# Patient Record
Sex: Female | Born: 1940
Health system: Southern US, Community
[De-identification: ages and names within clinical notes are randomized; demographics above are authoritative.]

## PROBLEM LIST (undated history)

## (undated) DIAGNOSIS — D649 Anemia, unspecified: Secondary | ICD-10-CM

## (undated) DIAGNOSIS — R1110 Vomiting, unspecified: Secondary | ICD-10-CM

## (undated) DIAGNOSIS — I499 Cardiac arrhythmia, unspecified: Secondary | ICD-10-CM

## (undated) DIAGNOSIS — Z9889 Other specified postprocedural states: Secondary | ICD-10-CM

## (undated) DIAGNOSIS — K5792 Diverticulitis of intestine, part unspecified, without perforation or abscess without bleeding: Secondary | ICD-10-CM

## (undated) DIAGNOSIS — IMO0001 Reserved for inherently not codable concepts without codable children: Secondary | ICD-10-CM

## (undated) DIAGNOSIS — I249 Acute ischemic heart disease, unspecified: Secondary | ICD-10-CM

## (undated) DIAGNOSIS — J189 Pneumonia, unspecified organism: Secondary | ICD-10-CM

## (undated) DIAGNOSIS — T4145XA Adverse effect of unspecified anesthetic, initial encounter: Secondary | ICD-10-CM

## (undated) DIAGNOSIS — T8859XA Other complications of anesthesia, initial encounter: Secondary | ICD-10-CM

## (undated) DIAGNOSIS — Z8679 Personal history of other diseases of the circulatory system: Secondary | ICD-10-CM

## (undated) DIAGNOSIS — I350 Nonrheumatic aortic (valve) stenosis: Secondary | ICD-10-CM

## (undated) DIAGNOSIS — R112 Nausea with vomiting, unspecified: Secondary | ICD-10-CM

## (undated) DIAGNOSIS — K219 Gastro-esophageal reflux disease without esophagitis: Secondary | ICD-10-CM

## (undated) DIAGNOSIS — I341 Nonrheumatic mitral (valve) prolapse: Secondary | ICD-10-CM

## (undated) DIAGNOSIS — I498 Other specified cardiac arrhythmias: Secondary | ICD-10-CM

## (undated) DIAGNOSIS — I2510 Atherosclerotic heart disease of native coronary artery without angina pectoris: Secondary | ICD-10-CM

## (undated) DIAGNOSIS — F419 Anxiety disorder, unspecified: Secondary | ICD-10-CM

## (undated) DIAGNOSIS — Z531 Procedure and treatment not carried out because of patient's decision for reasons of belief and group pressure: Secondary | ICD-10-CM

## (undated) DIAGNOSIS — I509 Heart failure, unspecified: Secondary | ICD-10-CM

## (undated) DIAGNOSIS — R51 Headache: Secondary | ICD-10-CM

## (undated) DIAGNOSIS — R3129 Other microscopic hematuria: Secondary | ICD-10-CM

## (undated) DIAGNOSIS — E049 Nontoxic goiter, unspecified: Secondary | ICD-10-CM

## (undated) DIAGNOSIS — R011 Cardiac murmur, unspecified: Secondary | ICD-10-CM

## (undated) DIAGNOSIS — I4891 Unspecified atrial fibrillation: Principal | ICD-10-CM

## (undated) DIAGNOSIS — E785 Hyperlipidemia, unspecified: Secondary | ICD-10-CM

## (undated) DIAGNOSIS — E0590 Thyrotoxicosis, unspecified without thyrotoxic crisis or storm: Secondary | ICD-10-CM

## (undated) DIAGNOSIS — R519 Headache, unspecified: Secondary | ICD-10-CM

## (undated) DIAGNOSIS — I10 Essential (primary) hypertension: Secondary | ICD-10-CM

## (undated) DIAGNOSIS — R0602 Shortness of breath: Secondary | ICD-10-CM

## (undated) DIAGNOSIS — K56609 Unspecified intestinal obstruction, unspecified as to partial versus complete obstruction: Secondary | ICD-10-CM

## (undated) DIAGNOSIS — E440 Moderate protein-calorie malnutrition: Secondary | ICD-10-CM

## (undated) HISTORY — DX: Gastro-esophageal reflux disease without esophagitis: K21.9

## (undated) HISTORY — PX: CORONARY ARTERY BYPASS GRAFT: SHX141

## (undated) HISTORY — PX: OTHER SURGICAL HISTORY: SHX169

## (undated) HISTORY — DX: Vomiting, unspecified: R11.10

## (undated) HISTORY — DX: Cardiac arrhythmia, unspecified: I49.9

## (undated) HISTORY — PX: URETHRAL STRICTURE DILATATION: SHX477

## (undated) HISTORY — DX: Nonrheumatic aortic (valve) stenosis: I35.0

## (undated) HISTORY — DX: Hyperlipidemia, unspecified: E78.5

## (undated) HISTORY — DX: Cardiac murmur, unspecified: R01.1

## (undated) HISTORY — DX: Other specified cardiac arrhythmias: I49.8

## (undated) HISTORY — DX: Unspecified intestinal obstruction, unspecified as to partial versus complete obstruction: K56.609

## (undated) HISTORY — DX: Moderate protein-calorie malnutrition: E44.0

---

## 1972-04-26 HISTORY — PX: TUBAL LIGATION: SHX77

## 1986-04-26 HISTORY — PX: UTERINE FIBROID SURGERY: SHX826

## 1986-04-26 HISTORY — PX: OVARIAN CYST SURGERY: SHX726

## 1998-01-03 ENCOUNTER — Ambulatory Visit (HOSPITAL_COMMUNITY): Admission: RE | Admit: 1998-01-03 | Discharge: 1998-01-03 | Payer: Self-pay | Admitting: Internal Medicine

## 1998-01-14 ENCOUNTER — Other Ambulatory Visit: Admission: RE | Admit: 1998-01-14 | Discharge: 1998-01-14 | Payer: Self-pay | Admitting: Surgery

## 1998-01-22 ENCOUNTER — Ambulatory Visit (HOSPITAL_COMMUNITY): Admission: RE | Admit: 1998-01-22 | Discharge: 1998-01-22 | Payer: Self-pay | Admitting: Surgery

## 1998-01-23 ENCOUNTER — Encounter: Payer: Self-pay | Admitting: Surgery

## 1999-06-04 ENCOUNTER — Other Ambulatory Visit: Admission: RE | Admit: 1999-06-04 | Discharge: 1999-06-04 | Payer: Self-pay | Admitting: Internal Medicine

## 1999-06-25 DIAGNOSIS — K649 Unspecified hemorrhoids: Secondary | ICD-10-CM | POA: Insufficient documentation

## 2000-04-01 ENCOUNTER — Encounter: Payer: Self-pay | Admitting: Internal Medicine

## 2000-04-01 ENCOUNTER — Ambulatory Visit (HOSPITAL_COMMUNITY): Admission: RE | Admit: 2000-04-01 | Discharge: 2000-04-01 | Payer: Self-pay | Admitting: Internal Medicine

## 2001-06-07 ENCOUNTER — Ambulatory Visit (HOSPITAL_COMMUNITY): Admission: RE | Admit: 2001-06-07 | Discharge: 2001-06-07 | Payer: Self-pay | Admitting: Internal Medicine

## 2001-06-07 ENCOUNTER — Encounter: Payer: Self-pay | Admitting: Internal Medicine

## 2006-04-01 ENCOUNTER — Ambulatory Visit: Payer: Self-pay | Admitting: Internal Medicine

## 2006-04-14 ENCOUNTER — Ambulatory Visit: Payer: Self-pay | Admitting: Internal Medicine

## 2006-09-28 ENCOUNTER — Ambulatory Visit (HOSPITAL_COMMUNITY): Admission: RE | Admit: 2006-09-28 | Discharge: 2006-09-28 | Payer: Self-pay | Admitting: Internal Medicine

## 2007-01-18 ENCOUNTER — Ambulatory Visit: Payer: Self-pay | Admitting: Internal Medicine

## 2007-01-18 LAB — CONVERTED CEMR LAB
Basophils Absolute: 0 10*3/uL (ref 0.0–0.1)
Basophils Relative: 0.6 % (ref 0.0–1.0)
Eosinophils Absolute: 0.1 10*3/uL (ref 0.0–0.6)
Eosinophils Relative: 0.8 % (ref 0.0–5.0)
HCT: 36.4 % (ref 36.0–46.0)
Hemoglobin: 12.8 g/dL (ref 12.0–15.0)
Lymphocytes Relative: 27.1 % (ref 12.0–46.0)
MCHC: 35.3 g/dL (ref 30.0–36.0)
MCV: 88.1 fL (ref 78.0–100.0)
Monocytes Absolute: 0.5 10*3/uL (ref 0.2–0.7)
Monocytes Relative: 7.2 % (ref 3.0–11.0)
Neutro Abs: 4.9 10*3/uL (ref 1.4–7.7)
Neutrophils Relative %: 64.3 % (ref 43.0–77.0)
Platelets: 228 10*3/uL (ref 150–400)
RBC: 4.13 M/uL (ref 3.87–5.11)
RDW: 12.9 % (ref 11.5–14.6)
WBC: 7.6 10*3/uL (ref 4.5–10.5)

## 2007-07-07 DIAGNOSIS — R011 Cardiac murmur, unspecified: Secondary | ICD-10-CM

## 2007-07-07 DIAGNOSIS — E041 Nontoxic single thyroid nodule: Secondary | ICD-10-CM | POA: Insufficient documentation

## 2007-07-07 DIAGNOSIS — E785 Hyperlipidemia, unspecified: Secondary | ICD-10-CM | POA: Insufficient documentation

## 2007-10-18 ENCOUNTER — Encounter: Admission: RE | Admit: 2007-10-18 | Discharge: 2007-10-18 | Payer: Self-pay | Admitting: Internal Medicine

## 2009-09-04 ENCOUNTER — Ambulatory Visit (HOSPITAL_COMMUNITY): Admission: RE | Admit: 2009-09-04 | Discharge: 2009-09-04 | Payer: Self-pay | Admitting: Internal Medicine

## 2010-01-14 ENCOUNTER — Encounter: Payer: Self-pay | Admitting: Cardiovascular Disease

## 2010-06-28 ENCOUNTER — Emergency Department (HOSPITAL_COMMUNITY)
Admission: EM | Admit: 2010-06-28 | Discharge: 2010-06-28 | Disposition: A | Discharge status: Home / Self Care | Payer: Medicare Other | Attending: Emergency Medicine | Admitting: Emergency Medicine

## 2010-06-28 ENCOUNTER — Emergency Department (HOSPITAL_COMMUNITY): Payer: Medicare Other

## 2010-06-28 DIAGNOSIS — E7800 Pure hypercholesterolemia, unspecified: Secondary | ICD-10-CM | POA: Insufficient documentation

## 2010-06-28 DIAGNOSIS — Z79899 Other long term (current) drug therapy: Secondary | ICD-10-CM | POA: Insufficient documentation

## 2010-06-28 DIAGNOSIS — R6884 Jaw pain: Secondary | ICD-10-CM | POA: Insufficient documentation

## 2010-06-28 DIAGNOSIS — J329 Chronic sinusitis, unspecified: Secondary | ICD-10-CM | POA: Insufficient documentation

## 2010-06-28 DIAGNOSIS — J3489 Other specified disorders of nose and nasal sinuses: Secondary | ICD-10-CM | POA: Insufficient documentation

## 2010-06-28 DIAGNOSIS — R0982 Postnasal drip: Secondary | ICD-10-CM | POA: Insufficient documentation

## 2010-06-28 DIAGNOSIS — R05 Cough: Secondary | ICD-10-CM | POA: Insufficient documentation

## 2010-06-28 DIAGNOSIS — R059 Cough, unspecified: Secondary | ICD-10-CM | POA: Insufficient documentation

## 2010-06-28 DIAGNOSIS — R6889 Other general symptoms and signs: Secondary | ICD-10-CM | POA: Insufficient documentation

## 2010-07-03 ENCOUNTER — Other Ambulatory Visit: Payer: Self-pay | Admitting: Internal Medicine

## 2010-07-03 DIAGNOSIS — R221 Localized swelling, mass and lump, neck: Secondary | ICD-10-CM

## 2010-07-07 ENCOUNTER — Ambulatory Visit
Admission: RE | Admit: 2010-07-07 | Discharge: 2010-07-07 | Disposition: A | Discharge status: Home / Self Care | Payer: Medicare Other | Source: Ambulatory Visit | Attending: Internal Medicine | Admitting: Internal Medicine

## 2010-07-07 DIAGNOSIS — R221 Localized swelling, mass and lump, neck: Secondary | ICD-10-CM

## 2010-07-07 MED ORDER — IOHEXOL 300 MG/ML  SOLN
75.0000 mL | Freq: Once | INTRAMUSCULAR | Status: AC | PRN
  Administered 2010-07-07: 75 mL via INTRAVENOUS

## 2010-07-10 ENCOUNTER — Encounter: Payer: Self-pay | Admitting: Cardiovascular Disease

## 2010-07-10 ENCOUNTER — Institutional Professional Consult (permissible substitution) (INDEPENDENT_AMBULATORY_CARE_PROVIDER_SITE_OTHER): Payer: Medicare Other | Admitting: Cardiovascular Disease

## 2010-07-10 DIAGNOSIS — I059 Rheumatic mitral valve disease, unspecified: Secondary | ICD-10-CM

## 2010-07-14 NOTE — Assessment & Plan Note (Signed)
Summary: consult: h/o cad. htn, [facility] [phone]. advantra. gd  Medications Added AVAPRO 75 MG TABS (IRBESARTAN) at bedtime CRESTOR 20 MG TABS (ROSUVASTATIN CALCIUM) Take one tablet by mouth daily. CENTRUM  TABS (MULTIPLE VITAMINS-MINERALS) once daily FISH OIL MAXIMUM STRENGTH 1200 MG CAPS (OMEGA-3 FATTY ACIDS) 1-2 daily SUPER CALCIUM 600 + D3 600-400 MG-UNIT TABS (CALCIUM CARBONATE-VITAMIN D) once daily VITAMIN D3 1000 UNIT TABS (CHOLECALCIFEROL) once daily VITAMIN C 500 MG TABS (ASCORBIC ACID) once daily      Allergies Added: ! NAPROXEN SODIUM (NAPROXEN SODIUM) ! ASPIRIN  Visit Type:  Initial Consult Primary Provider:  Dr. [name]  CC:  no complaints.  History of Present Illness: 70 yo female with history of GERD, mitral valve prolapse, hyperlipidemia, aortic stenosis and HTN who is here today to establish cardiac care. She has been followed in the past by Dr. Donnie Aho, Dr. Nicholaus Bloom and then Dr. Katrinka Blazing.  She is here today to establish care with me. No chest pain, SOB, palpitations, near syncope or syncope. She exercises most days at [employer]. She walks in her neighborhood.     Preventive Screening-Counseling & Management  Alcohol-Tobacco     Smoking Status: never  Caffeine-Diet-Exercise     Does Patient Exercise: yes      Drug Use:  no.    Current Medications (verified): 1)  Avapro 75 Mg Tabs (Irbesartan) .... At Bedtime 2)  Crestor 20 Mg Tabs (Rosuvastatin Calcium) .... Take One Tablet By Mouth Daily. 3)  Centrum  Tabs (Multiple Vitamins-Minerals) .... Once Daily 4)  Fish Oil Maximum Strength 1200 Mg Caps (Omega-3 Fatty Acids) .[name].. 1-2 Daily 5)  Super Calcium 600 + D3 600-400 Mg-Unit Tabs (Calcium Carbonate-Vitamin D) .... Once Daily 6)  Vitamin D3 1000 Unit Tabs (Cholecalciferol) .... Once Daily 7)  Vitamin C 500 Mg Tabs (Ascorbic Acid) .... Once Daily  Allergies (verified): 1)  ! Naproxen Sodium (Naproxen Sodium) 2)  ! Aspirin  Past History:  Past Medical  History: Current Problems:  NEOPLASM, COLON, FAMILY HX (ICD-V16.0) HYPERLIPIDEMIA (ICD-272.4) THYROID NODULE (ICD-241.0), Goiter MITRAL VALVE PROLAPSE (ICD-424.0) HEMORRHOIDS (ICD-455.6) DIVERTICULOSIS, COLON (ICD-562.10) HTN per records-pt denies GERD  Past Surgical History: Urethral dilatation Laparoscopic surgery abdomen Uterine fibroid removed  Family History: Reviewed history and no changes required. Father: died 35 - colon cancer Mother: died 62 COPD, dementia Siblings: 1-brother - living 37   CAD in uncles, grandparents  Social History: Reviewed history and no changes required. Retired-worked for [employer] Tobacco Use - No.  Alcohol Use - no Regular Exercise - yes Drug Use - no Smoking Status:  never Does Patient Exercise:  yes Drug Use:  no  Review of Systems  The patient denies fatigue, malaise, fever, weight gain/loss, vision loss, decreased hearing, hoarseness, chest pain, palpitations, shortness of breath, prolonged cough, wheezing, sleep apnea, coughing up blood, abdominal pain, blood in stool, nausea, vomiting, diarrhea, heartburn, incontinence, blood in urine, muscle weakness, joint pain, leg swelling, rash, skin lesions, headache, fainting, dizziness, depression, anxiety, enlarged lymph nodes, easy bruising or bleeding, and environmental allergies.    Vital Signs:  Patient profile:   70 year old female Height:      67 inches Weight:      138.25 pounds BMI:     21.73 Pulse rate:   53 / minute BP sitting:   115 / 46  (left arm) Cuff size:   regular  Vitals Entered By: Caralee Ates CMA (July 10, 2010 2:37 PM)  Physical Exam  General:  General: Well developed, well nourished, NAD HEENT: OP clear, mucus membranes moist. Thyroid enlarged.  SKIN: warm, dry Neuro: No focal deficits Musculoskeletal: Muscle strength 5/5 all ext Psychiatric: Mood and affect normal Neck: No JVD, no carotid bruits, no thyromegaly,  no lymphadenopathy. Lungs:Clear bilaterally, no wheezes, rhonci, crackles CV: RRR with systolic  murmur. No  gallops rubs Abdomen: soft, NT, ND, BS present Extremities: No edema, pulses 2+.    EKG  Procedure date:  07/10/2010  Findings:      NSR, rate 53 bpm. PVCs. Non-specific T wave abnormalities.   Impression & Recommendations:  Problem # 1:  MITRAL VALVE PROLAPSE (ICD-424.0)  She does not wish to ask for records from [facility]. No recent echo. Will order echo. Will call with results. Continue ARB for afterload reduction with mitral regurgitation.   Her updated medication list for this problem includes:    Avapro 75 Mg Tabs (Irbesartan) .[name]... At bedtime  Orders: Echocardiogram (Echo)  Patient Instructions: 1)  Your physician recommends that you schedule a follow-up appointment in: 6 months with Dr. Clifton James 2)  Your physician recommends that you continue on your current medications as directed. Please refer to the Current Medication list given to you today. 3)  Your physician has requested that you have an echocardiogram.  Echocardiography is a painless test that uses sound waves to create images of your heart. It provides your doctor with information about the size and shape of your heart and how well your heart's chambers and valves are working.  This procedure takes approximately one hour. There are no restrictions for this procedure.

## 2010-07-21 ENCOUNTER — Other Ambulatory Visit (HOSPITAL_COMMUNITY): Payer: Self-pay | Admitting: Cardiovascular Disease

## 2010-07-21 DIAGNOSIS — I341 Nonrheumatic mitral (valve) prolapse: Secondary | ICD-10-CM

## 2010-07-23 ENCOUNTER — Ambulatory Visit (HOSPITAL_COMMUNITY): Payer: Medicare Other | Attending: Cardiovascular Disease | Admitting: Radiology

## 2010-07-23 DIAGNOSIS — I341 Nonrheumatic mitral (valve) prolapse: Secondary | ICD-10-CM

## 2010-07-23 DIAGNOSIS — I10 Essential (primary) hypertension: Secondary | ICD-10-CM | POA: Insufficient documentation

## 2010-07-23 DIAGNOSIS — R011 Cardiac murmur, unspecified: Secondary | ICD-10-CM

## 2010-07-23 DIAGNOSIS — I059 Rheumatic mitral valve disease, unspecified: Secondary | ICD-10-CM | POA: Insufficient documentation

## 2010-07-23 DIAGNOSIS — I079 Rheumatic tricuspid valve disease, unspecified: Secondary | ICD-10-CM | POA: Insufficient documentation

## 2010-07-23 DIAGNOSIS — E785 Hyperlipidemia, unspecified: Secondary | ICD-10-CM | POA: Insufficient documentation

## 2010-09-08 NOTE — Assessment & Plan Note (Signed)
[facility]                         GASTROENTEROLOGY OFFICE NOTE   NAME:[name] [name]                    MRN:          [id number]  DATE:01/18/2007                            DOB:          1940/07/12    PRIMARY PHYSICIAN:  Geoffry Paradise, M.D.   PROBLEMS:  Left lower quadrant abdominal pain.   HISTORY:  [name] [name] is a pleasant 70 year old white female known to Dr.  Lina Sar, who last had colonoscopy done in December, 2007.  She was  noted to have universal diverticulosis at that time.  She had onset  around July 3 after eating a salad with an episode of severe crampy  abdominal pain in her left lower quadrant, which bothered her all night.  She said she gets a feeling like something is lodged and blocked in her  bowel, and then finally will start having bowel movements to the point  of diarrhea and purges her bowel.  She says that after one of these  episodes, she feels fine, perhaps for a day or two.  She said after the  episode in July, she did fine.  She had one mild episode in August, then  over this past weekend after eating at [employer] and having a large  salad, she developed bad, crampy abdominal pain again, followed  eventually by progressively looser stools, but since that time, she has  been having discomfort.  She has had a bowel movement, has not had any  melena or hematochezia.  No fevers or chills.  No nausea or vomiting.  Has had some increased bloating, etc.  She called the office yesterday.  Was called in a prescription for Symax, which she has been taking, and  says that did help last evening; however, she is still sore.   CURRENT MEDICATIONS:  1. Avapro 150 daily.  2. Aspirin 81 mg daily.  3. Crestor 20 daily.  4. Multivitamin daily.   ALLERGIES:  SULFA, NAPROXEN.   PAST MEDICAL HISTORY:  Pertinent for history of a goiter,  hyperlipidemia, mitral valve prolapse.   FAMILY HISTORY:  Pertinent for heart disease.  Her  father did have colon  cancer.   SOCIAL HISTORY:  Patient is divorced.  Has three children.  She lives  alone.  She is a nonsmoker.  Drinks alcohol seldom.   REVIEW OF SYSTEMS:  Pertinent for the heart murmur.  She does have  problems with anxiety and chronic problems with slight urinary stress  incontinence, otherwise as outlined above.   PHYSICAL EXAMINATION:  A well-developed white female in no acute  distress.  Height is 5 feet 7.  Weight is 143.6.  Blood pressure 120/76.  Pulse is  72.  HEENT:  Normocephalic and atraumatic.  PERRLA.  EOMI.  Sclerae are  anicteric.  NECK:  Supple.  CARDIOVASCULAR:  Regular rate and rhythm with S1 and S2.  No murmur, rub  or gallop.  PULMONARY:  Clear to A&P.  ABDOMEN:  Soft.  She is tender in the left lower quadrant.  There is no  guarding or rebound.  No mass or hepatosplenomegaly.  RECTAL:  Brown stool which  is heme positive, trace.   IMPRESSION:  97. A 70 year old white female with crampy left lower quadrant      abdominal discomfort now for five days.  Suspect she does have some      diverticulitis.  2. Trace hemoccult positive stools.  3. Other diagnoses as outlined above.   PLAN:  1. Continue Symax b.i.d. p.r.n.  2. Add Cipro 500 b.i.d. x10 days and Flagyl 500 mg b.i.d. x10 days.  3. Low residue diet until her pain is significantly improved.  4. I offered her a pain medication, which she declines at this time.  5. Return office visit with Dr. Juanda Chance in 2-3 weeks.  The patient was      also instructed that should her pain not resolve after she      completes the antibiotics, she should call us for further      instructions, and if the pain worsens in the interim, she should      also call and at that time will need CT of the abdomen and pelvis.      Mike Gip, PA-C  Electronically Signed      Wilhemina Bonito. Marina Goodell, MD  Electronically Signed   AE/MedQ  DD: 01/18/2007  DT: 01/19/2007  Job #: [id number]

## 2010-09-22 ENCOUNTER — Encounter: Payer: Self-pay | Admitting: Cardiovascular Disease

## 2011-04-19 ENCOUNTER — Emergency Department (HOSPITAL_COMMUNITY): Payer: Medicare Other

## 2011-04-19 ENCOUNTER — Emergency Department (HOSPITAL_COMMUNITY)
Admission: EM | Admit: 2011-04-19 | Discharge: 2011-04-19 | Disposition: A | Discharge status: Home / Self Care | Payer: Medicare Other | Attending: Emergency Medicine | Admitting: Emergency Medicine

## 2011-04-19 ENCOUNTER — Encounter: Payer: Self-pay | Admitting: *Deleted

## 2011-04-19 DIAGNOSIS — R509 Fever, unspecified: Secondary | ICD-10-CM | POA: Insufficient documentation

## 2011-04-19 DIAGNOSIS — K089 Disorder of teeth and supporting structures, unspecified: Secondary | ICD-10-CM | POA: Insufficient documentation

## 2011-04-19 DIAGNOSIS — R5383 Other fatigue: Secondary | ICD-10-CM | POA: Insufficient documentation

## 2011-04-19 DIAGNOSIS — K0889 Other specified disorders of teeth and supporting structures: Secondary | ICD-10-CM

## 2011-04-19 DIAGNOSIS — R5381 Other malaise: Secondary | ICD-10-CM | POA: Insufficient documentation

## 2011-04-19 DIAGNOSIS — I10 Essential (primary) hypertension: Secondary | ICD-10-CM | POA: Insufficient documentation

## 2011-04-19 HISTORY — DX: Nontoxic goiter, unspecified: E04.9

## 2011-04-19 HISTORY — DX: Essential (primary) hypertension: I10

## 2011-04-19 LAB — COMPREHENSIVE METABOLIC PANEL
ALT: 16 U/L (ref 0–35)
AST: 18 U/L (ref 0–37)
Albumin: 3.8 g/dL (ref 3.5–5.2)
Chloride: 100 mEq/L (ref 96–112)
Creatinine, Ser: 0.76 mg/dL (ref 0.50–1.10)
Potassium: 3.8 mEq/L (ref 3.5–5.1)
Sodium: 135 mEq/L (ref 135–145)
Total Bilirubin: 0.6 mg/dL (ref 0.3–1.2)

## 2011-04-19 LAB — URINALYSIS, ROUTINE W REFLEX MICROSCOPIC
Bilirubin Urine: NEGATIVE
Glucose, UA: NEGATIVE mg/dL
Protein, ur: NEGATIVE mg/dL

## 2011-04-19 LAB — CBC
MCV: 88 fL (ref 78.0–100.0)
Platelets: 204 10*3/uL (ref 150–400)
RBC: 3.99 MIL/uL (ref 3.87–5.11)
RDW: 13.5 % (ref 11.5–15.5)
WBC: 6 10*3/uL (ref 4.0–10.5)

## 2011-04-19 LAB — URINE MICROSCOPIC-ADD ON

## 2011-04-19 MED ORDER — SODIUM CHLORIDE 0.9 % IV BOLUS (SEPSIS)
250.0000 mL | Freq: Once | INTRAVENOUS | Status: AC
  Administered 2011-04-19: 250 mL via INTRAVENOUS

## 2011-04-19 MED ORDER — ONDANSETRON 4 MG PO TBDP
8.0000 mg | ORAL_TABLET | Freq: Once | ORAL | Status: AC
  Administered 2011-04-19: 8 mg via ORAL
  Filled 2011-04-19: qty 2

## 2011-04-19 NOTE — ED Provider Notes (Signed)
Medical screening examination/treatment/procedure(s) were conducted as a shared visit with non-physician practitioner(s) and myself.  I personally evaluated the patient during the encounter 70 year old, female, who had dental surgery at the beginning of December complains of right jaw pain, and a loud pop in her right ear that occurred last night.  She has not had headaches, nausea, vomiting, cough, shortness of breath, or diarrhea.  She denies urinary tract symptoms.  She has a history of mitral valve prolapse, so she was started on amoxicillin prior to her dental surgery.  She has Percocet, which she took prior to arrival here.  Odis Luster.  Symptoms are subsiding.  On physical examination.  There is no evidence of an oral pharyngeal infection.  She has no tenderness or swelling in her neck.  In her right ear is normal.  There is no evidence of acute illness.  We will release her to home .  Nicholes Stairs, MD 04/19/11 [phone]

## 2011-04-19 NOTE — ED Notes (Signed)
To ed for further eval of right lower jaw pain. Pt states she has been under the care of a dentist for this same pain and had teeth pulled, so now she thinks she has a dry socket.

## 2011-04-19 NOTE — ED Notes (Signed)
Pt now states "I am having a little weakness." no neurological deficits noted. Able to move all extremities without difficulty. Moved to Major side of ED for further evaluation per provider request.

## 2011-04-19 NOTE — ED Provider Notes (Signed)
Medical screening examination/treatment/procedure(s) were conducted as a shared visit with non-physician practitioner(s) and myself.  I personally evaluated the patient during the encounter  Nicholes Stairs, MD 04/19/11 [phone]

## 2011-04-19 NOTE — ED Provider Notes (Signed)
History     CSN: [id number]  Arrival date & time 04/19/11  0908   First MD Initiated Contact with Patient 04/19/11 0940      Chief Complaint  Patient presents with  . Dental Pain    (Consider location/radiation/quality/duration/timing/severity/associated sxs/prior treatment) Patient is a 70 y.o. female presenting with tooth pain. The history is provided by the patient.  Dental Pain   Pt presents to the ED with complaints of tooth pain and feeling weak with subjective fevers at home. The patient is awake and alert and able to tell me that she has been taking her antibiotics and pain medications. She denies cough, headaches, focal weakness, neck pain, abdominal pain, nausea, vomiting.  Past Medical History  Diagnosis Date  . Hypertension   . Goiter     History reviewed. No pertinent past surgical history.  History reviewed. No pertinent family history.  History  Substance Use Topics  . Smoking status: Never Smoker   . Smokeless tobacco: Not on file  . Alcohol Use: No    OB History    Grav Para Term Preterm Abortions TAB SAB Ect Mult Living                  Review of Systems  All other systems reviewed and are negative.    Allergies  Sulfa drugs cross reactors; Aspirin; and Naproxen sodium  Home Medications   Current Outpatient Rx  Name Route Sig Dispense Refill  . ACETAMINOPHEN 500 MG PO TABS Oral Take 500 mg by mouth every 6 (six) hours as needed. For pain     . AMOXICILLIN 500 MG PO CAPS Oral Take 1 mg by mouth 3 (three) times daily.      . OXYCODONE-ACETAMINOPHEN 5-325 MG PO TABS Oral Take 0.5 tablets by mouth every 4 (four) hours as needed. For pain       BP 109/34  Pulse 87  Temp(Src) 97.4 F (36.3 C) (Oral)  Resp 16  SpO2 96%  Physical Exam  Nursing note and vitals reviewed. Constitutional: She is oriented to person, place, and time. She appears well-developed and well-nourished.  HENT:  Head: Normocephalic and atraumatic.  Mouth/Throat:      Eyes: Conjunctivae are normal. Pupils are equal, round, and reactive to light.  Neck: Trachea normal, normal range of motion and full passive range of motion without pain. Neck supple. No tracheal deviation present. No thyromegaly present.  Cardiovascular: Normal rate, regular rhythm and normal pulses.   Pulmonary/Chest: Effort normal and breath sounds normal. No respiratory distress. She has no wheezes. She has no rales. Chest wall is not dull to percussion. She exhibits no tenderness, no crepitus, no edema, no deformity and no retraction.  Abdominal: Soft. Normal appearance and bowel sounds are normal. She exhibits no distension and no mass. There is no tenderness. There is no rebound and no guarding.  Musculoskeletal: Normal range of motion.  Neurological: She is alert and oriented to person, place, and time. She has normal strength.  Skin: Skin is warm, dry and intact.  Psychiatric: She has a normal mood and affect. Her speech is normal and behavior is normal. Judgment and thought content normal. Cognition and memory are normal.    ED Course  Procedures (including critical care time)  Labs Reviewed  CBC - Abnormal; Notable for the following:    Hemoglobin 11.9 (*)    HCT 35.1 (*)    All other components within normal limits  COMPREHENSIVE METABOLIC PANEL - Abnormal; Notable for the  following:    Glucose, Bld 101 (*)    GFR calc non Af Amer 84 (*)    All other components within normal limits  URINALYSIS, ROUTINE W REFLEX MICROSCOPIC - Abnormal; Notable for the following:    Hgb urine dipstick MODERATE (*)    All other components within normal limits  URINE MICROSCOPIC-ADD ON   Dg Chest 2 View  04/19/2011  *RADIOLOGY REPORT*  Clinical Data: Fever, chills, weakness.  CHEST - 2 VIEW  Comparison: None.  Findings: Heart and mediastinal contours are within normal limits. No focal opacities or effusions.  No acute bony abnormality.  IMPRESSION: No active cardiopulmonary disease.   Original Report Authenticated By: Cyndie Chime, M.D.     1. Pain of tooth socket       MDM  Dr. Nino Parsley has examined patient as well and reviewed labs with me. Pt is stable and is able to be discharged at this time. Will have follow-up with Dentist if patient continue to have dental pain. Pt given strict return to ED instructions.        Dorthula Matas, PA 04/19/11 1158

## 2011-04-24 ENCOUNTER — Inpatient Hospital Stay (HOSPITAL_COMMUNITY)
Admission: EM | Admit: 2011-04-24 | Discharge: 2011-04-28 | DRG: 392 | Disposition: A | Discharge status: Home / Self Care | Payer: Medicare Other | Attending: Internal Medicine | Admitting: Internal Medicine

## 2011-04-24 ENCOUNTER — Encounter (HOSPITAL_COMMUNITY): Payer: Self-pay | Admitting: Emergency Medicine

## 2011-04-24 ENCOUNTER — Emergency Department (HOSPITAL_COMMUNITY): Payer: Medicare Other

## 2011-04-24 DIAGNOSIS — F411 Generalized anxiety disorder: Secondary | ICD-10-CM | POA: Diagnosis present

## 2011-04-24 DIAGNOSIS — Z79899 Other long term (current) drug therapy: Secondary | ICD-10-CM

## 2011-04-24 DIAGNOSIS — K5792 Diverticulitis of intestine, part unspecified, without perforation or abscess without bleeding: Secondary | ICD-10-CM | POA: Diagnosis present

## 2011-04-24 DIAGNOSIS — E876 Hypokalemia: Secondary | ICD-10-CM | POA: Diagnosis present

## 2011-04-24 DIAGNOSIS — Z882 Allergy status to sulfonamides status: Secondary | ICD-10-CM

## 2011-04-24 DIAGNOSIS — Z888 Allergy status to other drugs, medicaments and biological substances status: Secondary | ICD-10-CM

## 2011-04-24 DIAGNOSIS — F419 Anxiety disorder, unspecified: Secondary | ICD-10-CM | POA: Diagnosis present

## 2011-04-24 DIAGNOSIS — R110 Nausea: Secondary | ICD-10-CM | POA: Diagnosis present

## 2011-04-24 DIAGNOSIS — K644 Residual hemorrhoidal skin tags: Secondary | ICD-10-CM | POA: Diagnosis present

## 2011-04-24 DIAGNOSIS — I359 Nonrheumatic aortic valve disorder, unspecified: Secondary | ICD-10-CM | POA: Diagnosis present

## 2011-04-24 DIAGNOSIS — K219 Gastro-esophageal reflux disease without esophagitis: Secondary | ICD-10-CM | POA: Diagnosis present

## 2011-04-24 DIAGNOSIS — E785 Hyperlipidemia, unspecified: Secondary | ICD-10-CM | POA: Diagnosis present

## 2011-04-24 DIAGNOSIS — I10 Essential (primary) hypertension: Secondary | ICD-10-CM | POA: Diagnosis present

## 2011-04-24 DIAGNOSIS — Z23 Encounter for immunization: Secondary | ICD-10-CM

## 2011-04-24 DIAGNOSIS — K5732 Diverticulitis of large intestine without perforation or abscess without bleeding: Principal | ICD-10-CM | POA: Diagnosis present

## 2011-04-24 HISTORY — DX: Diverticulitis of intestine, part unspecified, without perforation or abscess without bleeding: K57.92

## 2011-04-24 HISTORY — DX: Anxiety disorder, unspecified: F41.9

## 2011-04-24 LAB — DIFFERENTIAL
Basophils Absolute: 0 10*3/uL (ref 0.0–0.1)
Lymphocytes Relative: 9 % — ABNORMAL LOW (ref 12–46)
Neutro Abs: 12.2 10*3/uL — ABNORMAL HIGH (ref 1.7–7.7)

## 2011-04-24 LAB — BASIC METABOLIC PANEL
CO2: 22 mEq/L (ref 19–32)
Chloride: 101 mEq/L (ref 96–112)
Potassium: 3.8 mEq/L (ref 3.5–5.1)
Sodium: 134 mEq/L — ABNORMAL LOW (ref 135–145)

## 2011-04-24 LAB — CBC
HCT: 37.8 % (ref 36.0–46.0)
Platelets: 272 10*3/uL (ref 150–400)
RDW: 13.7 % (ref 11.5–15.5)
WBC: 14.3 10*3/uL — ABNORMAL HIGH (ref 4.0–10.5)

## 2011-04-24 LAB — OCCULT BLOOD, POC DEVICE: Fecal Occult Bld: NEGATIVE

## 2011-04-24 MED ORDER — METRONIDAZOLE IN NACL 5-0.79 MG/ML-% IV SOLN
500.0000 mg | Freq: Three times a day (TID) | INTRAVENOUS | Status: DC
  Administered 2011-04-24 – 2011-04-27 (×8): 500 mg via INTRAVENOUS
  Filled 2011-04-24 (×9): qty 100

## 2011-04-24 MED ORDER — ALUM & MAG HYDROXIDE-SIMETH 200-200-20 MG/5ML PO SUSP
30.0000 mL | Freq: Four times a day (QID) | ORAL | Status: DC | PRN
  Administered 2011-04-26: 30 mL via ORAL
  Filled 2011-04-24: qty 30

## 2011-04-24 MED ORDER — METOCLOPRAMIDE HCL 5 MG/ML IJ SOLN
10.0000 mg | Freq: Once | INTRAMUSCULAR | Status: AC
  Administered 2011-04-24: 10 mg via INTRAVENOUS
  Filled 2011-04-24: qty 2

## 2011-04-24 MED ORDER — HYDROMORPHONE HCL PF 1 MG/ML IJ SOLN
1.0000 mg | Freq: Once | INTRAMUSCULAR | Status: AC
  Administered 2011-04-24: 1 mg via INTRAVENOUS
  Filled 2011-04-24: qty 1

## 2011-04-24 MED ORDER — LORAZEPAM 2 MG/ML IJ SOLN
0.5000 mg | Freq: Once | INTRAMUSCULAR | Status: AC
  Administered 2011-04-24: 0.5 mg via INTRAVENOUS
  Filled 2011-04-24: qty 1

## 2011-04-24 MED ORDER — ONDANSETRON HCL 4 MG/2ML IJ SOLN
4.0000 mg | Freq: Once | INTRAMUSCULAR | Status: AC
  Administered 2011-04-24: 4 mg via INTRAVENOUS
  Filled 2011-04-24: qty 2

## 2011-04-24 MED ORDER — LORAZEPAM 1 MG PO TABS
1.0000 mg | ORAL_TABLET | Freq: Once | ORAL | Status: AC
  Administered 2011-04-24: 1 mg via ORAL
  Filled 2011-04-24: qty 1

## 2011-04-24 MED ORDER — ONDANSETRON HCL 4 MG/2ML IJ SOLN: 4.0000 mg | Freq: Four times a day (QID) | INTRAMUSCULAR | Status: DC | PRN

## 2011-04-24 MED ORDER — ONDANSETRON HCL 4 MG PO TABS: 4.0000 mg | ORAL_TABLET | Freq: Four times a day (QID) | ORAL | Status: DC | PRN

## 2011-04-24 MED ORDER — POLYETHYLENE GLYCOL 3350 17 G PO PACK
17.0000 g | PACK | Freq: Every day | ORAL | Status: DC | PRN
  Filled 2011-04-24: qty 1

## 2011-04-24 MED ORDER — IOHEXOL 300 MG/ML  SOLN
100.0000 mL | Freq: Once | INTRAMUSCULAR | Status: AC | PRN
  Administered 2011-04-24: 100 mL via INTRAVENOUS

## 2011-04-24 MED ORDER — OLMESARTAN 10 MG HALF TABLET
10.0000 mg | ORAL_TABLET | Freq: Every day | ORAL | Status: DC
  Administered 2011-04-25 – 2011-04-28 (×4): 10 mg via ORAL
  Filled 2011-04-24 (×4): qty 1

## 2011-04-24 MED ORDER — ACETAMINOPHEN 325 MG PO TABS
650.0000 mg | ORAL_TABLET | Freq: Four times a day (QID) | ORAL | Status: DC | PRN
  Administered 2011-04-25 – 2011-04-28 (×5): 650 mg via ORAL
  Filled 2011-04-24 (×5): qty 2

## 2011-04-24 MED ORDER — BIOTENE DRY MOUTH MT LIQD
15.0000 mL | Freq: Two times a day (BID) | OROMUCOSAL | Status: DC
  Administered 2011-04-25 – 2011-04-27 (×6): 15 mL via OROMUCOSAL

## 2011-04-24 MED ORDER — CIPROFLOXACIN IN D5W 400 MG/200ML IV SOLN
400.0000 mg | Freq: Two times a day (BID) | INTRAVENOUS | Status: DC
  Administered 2011-04-25 – 2011-04-26 (×5): 400 mg via INTRAVENOUS
  Filled 2011-04-24 (×7): qty 200

## 2011-04-24 MED ORDER — MORPHINE SULFATE 2 MG/ML IJ SOLN
2.0000 mg | INTRAMUSCULAR | Status: DC | PRN
  Administered 2011-04-25 – 2011-04-27 (×5): 2 mg via INTRAVENOUS
  Filled 2011-04-24 (×5): qty 1

## 2011-04-24 MED ORDER — SODIUM CHLORIDE 0.9 % IV SOLN
INTRAVENOUS | Status: DC
  Administered 2011-04-24 – 2011-04-27 (×3): via INTRAVENOUS

## 2011-04-24 MED ORDER — SODIUM CHLORIDE 0.9 % IV BOLUS (SEPSIS)
500.0000 mL | INTRAVENOUS | Status: AC
  Administered 2011-04-24: 500 mL via INTRAVENOUS

## 2011-04-24 MED ORDER — LORAZEPAM 1 MG PO TABS
1.0000 mg | ORAL_TABLET | Freq: Four times a day (QID) | ORAL | Status: DC | PRN
  Administered 2011-04-26 – 2011-04-27 (×4): 1 mg via ORAL
  Filled 2011-04-24 (×4): qty 1

## 2011-04-24 MED ORDER — CHLORHEXIDINE GLUCONATE 0.12 % MT SOLN
15.0000 mL | Freq: Two times a day (BID) | OROMUCOSAL | Status: DC
  Administered 2011-04-24 – 2011-04-27 (×7): 15 mL via OROMUCOSAL
  Filled 2011-04-24 (×6): qty 15

## 2011-04-24 MED ORDER — ENOXAPARIN SODIUM 40 MG/0.4ML ~~LOC~~ SOLN
40.0000 mg | Freq: Every day | SUBCUTANEOUS | Status: DC
  Administered 2011-04-25 – 2011-04-27 (×3): 40 mg via SUBCUTANEOUS
  Filled 2011-04-24 (×4): qty 0.4

## 2011-04-24 NOTE — ED Notes (Signed)
Pt is vomiting again. Pt already received zofran without relief from nausea/vomiting.

## 2011-04-24 NOTE — ED Provider Notes (Signed)
Pt w/ h/o diverticulitis presented to ED today w/ LLQ pain and N/V.  Moved to CDU to wait for CT abd/pelvis results.  Continues to have pain and vomiting.  Most recent dose of dilaudid made her "out of it".  CT abd/pelvis shows mild acute diverticulitis.  Results discussed w/ pt.  She does not want to go home because not feeling well enough and lives alone.  Will initiate cipro/flagyl, treat nausea and reassess shortly.  6:17 PM   Pt still vomiting after receiving zofran.  Will try 10mg  IV reglan. 6:34 PM   Nausea improved but pt continues to have severe LLQ pain.  I do not feel comfortable giving her any other narcotic pain relievers because the dilaudid has made her very anxious and she feels as though it is more difficult to breath.  On re-examination, pt anxious appearing, VSS, abd benign but mod-sev LLQ ttp w/ guarding.  Dr. Waynard Edwards consulted for admission for diverticulitis w/ uncontrolled symptoms.    Otilio Miu, [place] 04/25/11 [phone]

## 2011-04-24 NOTE — ED Notes (Signed)
Pt vomiting again. Pt received reglan 10mg  iv just a few minutes ago but is still very nauseated.

## 2011-04-24 NOTE — ED Notes (Signed)
Physician at bedside evaluating patient.

## 2011-04-24 NOTE — ED Notes (Signed)
Pt reports constipation onset last night. Pt has tried stool softeners, suppositories, and milk of magnesia. Last normal BM 2 days ago.

## 2011-04-24 NOTE — ED Provider Notes (Signed)
3:21 PM Patient to come to CDU holding for CT abd/pelvis, concern for diverticulitis.  Patient with LLQ pain, nausea, constipation, hematochezia.  Patient's GI doctor is Huston Foley, PCP is [name].  Discussed patient with Kyung Bacca, PA-C, who assumes care of patient at change of shift.    Dillard Cannon Cruger, [place] 04/24/11 [phone]

## 2011-04-24 NOTE — ED Provider Notes (Signed)
History     CSN: [id number]  Arrival date & time 04/24/11  1240   First MD Initiated Contact with Patient 04/24/11 1343      Chief Complaint  Patient presents with  . Constipation    (Consider location/radiation/quality/duration/timing/severity/associated sxs/prior treatment) HPI Comments: Patient reports that she began having LLQ abdominal pain yesterday, which is getting progressively worse.  She also reports that she has been taking oxycodone for tooth pain for the past 5 days.  She reports that her last BM was three days ago and she reports that it was hard.  She also has had a small amount of bright red blood mixed in with a very little amount of stool this morning.  She has taken stool softeners and Milk of Mag for the constipation, which has not helped.  She has a PMH significant for Diverticulitis.  Her GI physician is Dr. Elige Radon.  She reports that is she is nauseous, but denies any vomiting.  No fevers.  Patient is a 70 y.o. female presenting with abdominal pain. The history is provided by the patient.  Abdominal Pain The primary symptoms of the illness include abdominal pain, nausea and hematochezia. The primary symptoms of the illness do not include fever, shortness of breath, vomiting, diarrhea or dysuria. The onset of the illness was gradual. The problem has been gradually worsening.  The patient has had a change in bowel habit. Additional symptoms associated with the illness include constipation. Symptoms associated with the illness do not include chills, diaphoresis, urgency, hematuria, frequency or back pain. Significant associated medical issues include diverticulitis.    Past Medical History  Diagnosis Date  . Hypertension   . Goiter   . Diverticulitis     Past Surgical History  Procedure Date  . Kidney surgery     History reviewed. No pertinent family history.  History  Substance Use Topics  . Smoking status: Never Smoker   . Smokeless tobacco: Not on file   . Alcohol Use: No    OB History    Grav Para Term Preterm Abortions TAB SAB Ect Mult Living                  Review of Systems  Constitutional: Negative for fever, chills and diaphoresis.  HENT: Positive for dental problem. Negative for trouble swallowing, neck pain and neck stiffness.   Respiratory: Negative for shortness of breath.   Cardiovascular: Negative for chest pain.  Gastrointestinal: Positive for nausea, abdominal pain, constipation and hematochezia. Negative for vomiting and diarrhea.  Genitourinary: Negative for dysuria, urgency, frequency and hematuria.  Musculoskeletal: Negative for back pain.  Neurological: Negative for dizziness, syncope and light-headedness.  Psychiatric/Behavioral: Negative for confusion.    Allergies  Sulfa drugs cross reactors; Aspirin; and Naproxen sodium  Home Medications   Current Outpatient Rx  Name Route Sig Dispense Refill  . ACETAMINOPHEN 500 MG PO TABS Oral Take 500 mg by mouth every 6 (six) hours as needed. For pain    . AMOXICILLIN 500 MG PO CAPS Oral Take 1 mg by mouth 3 (three) times daily.     . IRBESARTAN 75 MG PO TABS Oral Take 75 mg by mouth at bedtime.      . OXYCODONE-ACETAMINOPHEN 5-325 MG PO TABS Oral Take 0.5 tablets by mouth every 4 (four) hours as needed. For pain      BP 128/98  Pulse 70  Temp(Src) 97.8 F (36.6 C) (Oral)  Resp 20  SpO2 98%  Physical Exam  Nursing note  and vitals reviewed. Constitutional: She is oriented to person, place, and time. She appears well-developed and well-nourished. No distress.  HENT:  Head: Normocephalic and atraumatic.  Neck: Normal range of motion. Neck supple.  Cardiovascular: Normal rate and regular rhythm.   Murmur heard. Pulmonary/Chest: Effort normal and breath sounds normal. No respiratory distress. She has no wheezes.  Abdominal: Soft. Bowel sounds are normal. She exhibits no distension and no mass. There is tenderness in the left lower quadrant. There is no  rebound and no guarding.  Genitourinary: Rectal exam shows external hemorrhoid. Rectal exam shows no internal hemorrhoid, no mass, no tenderness and anal tone normal. Guaiac negative stool.       External hemorrhoid visualized.  Hemorrhoid soft not thrombosed.  Musculoskeletal: Normal range of motion.  Neurological: She is alert and oriented to person, place, and time.  Skin: Skin is warm and dry. She is not diaphoretic.  Psychiatric: She has a normal mood and affect.    ED Course  Procedures (including critical care time)   Labs Reviewed  CBC  DIFFERENTIAL  BASIC METABOLIC PANEL  URINALYSIS, ROUTINE W REFLEX MICROSCOPIC   No results found.   No diagnosis found.  Patient moved to the CDU while awaiting results of her CT scan.  Patient discussed with Trixie Dredge, PA-C who assumes care of patient in the CDU.  MDM  LLQ pain with PMH significant for Diverticulitis.  Therefore, labs and a CT abdomen/pelvis with contrast were ordered on the patient to rule out Diverticulitis.          Pascal Lux [facility] 04/24/11 1854

## 2011-04-24 NOTE — ED Notes (Signed)
Pt tearful c/o abdominal pain at 8/10.  She currently denies N/V.  She states that she has not had a "normal BM" for 3 days. She reports that she has had a few very small watery BM's in the last 24 hours.  She stated that for the last several days her stools have been hard and very difficult to pasa.  She has tried Milk of magnesia and senokot("dollar store brand") without relief.  Abdomen is soft, distended and tender on palpation. Daughter at [facility].

## 2011-04-24 NOTE — ED Notes (Signed)
Patient complaining of constipation x 3 days. Complaining of pain which started today this morning.

## 2011-04-24 NOTE — ED Notes (Signed)
PO contrast started.

## 2011-04-24 NOTE — ED Notes (Signed)
Labs drawn with IV insertion and sent to the lab.  Pt c/o pain but also seems very anxious/nervous.

## 2011-04-24 NOTE — ED Notes (Signed)
Pt crying and c/o of pain. K Schinlever, PA aware of pt's co of pain 10/10 and continued nausea.

## 2011-04-24 NOTE — H&P (Signed)
[name] [name] is an 70 y.o. female.   Chief Complaint: LLQ pain HPI: [name] [name] is a pleasant lady who reports having issues with a dental abscess in the last week or two. She was on antibiotics for this.  Then, this a.m. And she had significant LLQ pain.  She presented to the ER and CT shows mild diverticulitis.  She does have a sig. Leukocytosis.  She has had so much nausea, vomiting and pain in the ER that she is requiring admission. She says she has not had a fever in the last few days. Last bm 4 days ago.  No blood in stool.  Past Medical History  Diagnosis Date  . Hypertension   . Goiter   . Diverticulitis GERD Hemorrhoids Aortic Stenosis MVP Hyperlipidemia      Past Surgical History  Procedure Date  . Kidney surgery Goiter bx Bladder surgery Last echo in last year she says it was stable       family history.:  Positive for colon cancer. Social History:  reports that she has never smoked. She does not have any smokeless tobacco history on file. She reports that she does not drink alcohol or use illicit drugs.  She is divorced and has three children.  Marina Gravel, and [place]. Jehovah's witness, refuses blood products.  Retired from [employer].  Allergies:  Allergies  Allergen Reactions  . Sulfa Drugs Cross Reactors Other (See Comments)    Unknown.  . Aspirin Rash  . Naproxen Sodium Swelling and Rash    Medications Prior to Admission  Medication Dose Route Frequency Provider Last Rate Last Dose  . HYDROmorphone (DILAUDID) injection 1 mg  1 mg Intravenous Once Pascal Lux Wingen   1 mg at 04/24/11 1426  . HYDROmorphone (DILAUDID) injection 1 mg  1 mg Intravenous Once Pascal Lux Wingen   1 mg at 04/24/11 1632  . HYDROmorphone (DILAUDID) injection 1 mg  1 mg Intravenous Once Otilio Miu, PA   1 mg at 04/24/11 1735  . iohexol (OMNIPAQUE) 300 MG/ML solution 100 mL  100 mL Intravenous Once PRN Medication Radiologist   100 mL at 04/24/11  1615  . LORazepam (ATIVAN) injection 0.5 mg  0.5 mg Intravenous Once Otilio Miu, PA   0.5 mg at 04/24/11 1937  . LORazepam (ATIVAN) tablet 1 mg  1 mg Oral Once [facility] Wingen   1 mg at 04/24/11 1424  . metoCLOPramide (REGLAN) injection 10 mg  10 mg Intravenous Once Otilio Miu, PA   10 mg at 04/24/11 1837  . ondansetron (ZOFRAN) injection 4 mg  4 mg Intravenous Once Pascal Lux Wingen   4 mg at 04/24/11 1426  . ondansetron (ZOFRAN) injection 4 mg  4 mg Intravenous Once Otilio Miu, PA   4 mg at 04/24/11 1818  . sodium chloride 0.9 % bolus 500 mL  500 mL Intravenous STAT Otilio Miu, PA   500 mL at 04/24/11 1821   Medications Prior to Admission  Medication Sig Dispense Refill  . acetaminophen (TYLENOL) 500 MG tablet Take 500 mg by mouth every 6 (six) hours as needed. For pain      . amoxicillin (AMOXIL) 500 MG capsule Take 1 mg by mouth 3 (three) times daily.       [name] oxyCODONE-acetaminophen (PERCOCET) 5-325 MG per tablet Take 0.5 tablets by mouth every 4 (four) hours as needed. For pain        Results for orders placed during the hospital  encounter of 04/24/11 (from the past 48 hour(s))  CBC     Status: Abnormal   Collection Time   04/24/11  2:02 PM      Component Value Range Comment   WBC 14.3 (*) 4.0 - 10.5 (K/uL)    RBC 4.31  3.87 - 5.11 (MIL/uL)    Hemoglobin 13.0  12.0 - 15.0 (g/dL)    HCT 40.9  81.1 - 91.4 (%)    MCV 87.7  78.0 - 100.0 (fL)    MCH 30.2  26.0 - 34.0 (pg)    MCHC 34.4  30.0 - 36.0 (g/dL)    RDW 78.2  95.6 - 21.3 (%)    Platelets 272  150 - 400 (K/uL)   DIFFERENTIAL     Status: Abnormal   Collection Time   04/24/11  2:02 PM      Component Value Range Comment   Neutrophils Relative 85 (*) 43 - 77 (%)    Neutro Abs 12.2 (*) 1.7 - 7.7 (K/uL)    Lymphocytes Relative 9 (*) 12 - 46 (%)    Lymphs Abs 1.3  0.7 - 4.0 (K/uL)    Monocytes Relative 6  3 - 12 (%)    Monocytes Absolute 0.8  0.1 - 1.0 (K/uL)    Eosinophils  Relative 0  0 - 5 (%)    Eosinophils Absolute 0.0  0.0 - 0.7 (K/uL)    Basophils Relative 0  0 - 1 (%)    Basophils Absolute 0.0  0.0 - 0.1 (K/uL)   BASIC METABOLIC PANEL     Status: Abnormal   Collection Time   04/24/11  2:02 PM      Component Value Range Comment   Sodium 134 (*) 135 - 145 (mEq/L)    Potassium 3.8  3.5 - 5.1 (mEq/L)    Chloride 101  96 - 112 (mEq/L)    CO2 22  19 - 32 (mEq/L)    Glucose, Bld 113 (*) 70 - 99 (mg/dL)    BUN 11  6 - 23 (mg/dL)    Creatinine, Ser 0.86  0.50 - 1.10 (mg/dL)    Calcium 57.8  8.4 - 10.5 (mg/dL)    GFR calc non Af Amer 87 (*) >90 (mL/min)    GFR calc Af Amer >90  >90 (mL/min)   OCCULT BLOOD, POC DEVICE     Status: Normal   Collection Time   04/24/11  3:01 PM      Component Value Range Comment   Fecal Occult Bld NEGATIVE      Ct Abdomen Pelvis W Contrast  04/24/2011  *RADIOLOGY REPORT*  Clinical Data: Lower quadrant pain, history of diverticulitis  CT ABDOMEN AND PELVIS WITH CONTRAST  Technique:  Multidetector CT imaging of the abdomen and pelvis was performed following the standard protocol during bolus administration of intravenous contrast.  Contrast: OMNIPAQUE IOHEXOL 300 MG/ML IV SOLN  Comparison: Report 04/01/2000 no images available  Findings: The lung bases are unremarkable.  Sagittal images of the spine shows disc space flattening at L4-L5 and L5-S1 level.  Mild disc bulge noted at L4-L5 and L5 S1 level.  Atherosclerotic calcifications of the abdominal aorta and the iliac arteries are noted.  Enhanced liver, spleen, pancreas and adrenal glands are unremarkable.  The kidneys show symmetrical enhancement.  Right kidney is malrotated. There is a large cyst arising from the mid pole of the right kidney extending inferiorly measures 8.5 x 6.1 cm best visualized in coronal image 46.  A cyst  is noted in lower pole of the left kidney measures 1 cm.  Mild distended gallbladder without evidence of calcified gallstones.  No aortic aneurysm.  No  hydronephrosis or hydroureter.  No small bowel obstruction.  No free abdominal air.  The terminal ileum is unremarkable.  There is moderate distention of the right colon and transverse colon with liquid stool.Few right colon diverticula without evidence of acute diverticulitis. Multiple diverticula are noted in the sigmoid colon.  Scattered diverticula are noted in left colon.  In axial image 64 and coronal image 22 there is a subtle stranding of the pericolonic fat in left lower quadrant.  Small amount of fluid noted adjacent to proximal sigmoid colon in the coronal image 22.  Findings are highly suspicious for mild diverticulitis.  No definite diverticular abscess.  No definite extraluminal contrast material is noted.  The uterus and adnexa are unremarkable.  Small amount of free fluid noted in posterior cul-de-sac.  Moderate distended urinary bladder. No destructive bony lesions are noted within pelvis.  IMPRESSION:  1.  Multiple sigmoid colon diverticula are noted.  There is subtle stranding of the pericolonic fat in the left lower quadrant with small amount of fluid  just inferior to proximal sigmoid colon. Findings are highly suspicious for mild diverticulitis.  No definite diverticular abscess noted. 2.  There is a large cyst arising from mid pole of the right kidney extending inferiorly measures at least 8.4 x 6.1 cm best visualized in coronal image 50 3.  No small bowel obstruction. 4.  There is moderate distention with liquid stool of the right colon and transverse colon. 5.  Small amount of free fluid noted in the posterior pelvis. Moderate distended urinary bladder.  Original Report Authenticated By: Natasha Mead, M.D.    ROS:as per the hpi. She is anxious.  Blood pressure 122/86, pulse 68, temperature 98.1 F (36.7 C), temperature source Oral, resp. rate 20, SpO2 95.00%.  Semi supine in bed. In some pain.  Alert, groggy from the meds she has received.  CTA bilat. No w.r.r rrr with 3/6 sem left and  rsb, no rub or gallop,  Abd, soft, hyperactive bs,  Tender in LLQ. No edema. No pallor or icterus  Assessment/Plan  Diverticulitis with significant pain.  Admit, hydrate with NS.  Treat with IV flagyl and cipro.  Probiotic.  Continue home meds as tolerated. Full  Code.  Prn anti-emetics and pain meds.  Ezequiel Kayser, MD 04/24/2011, 7:59 PM

## 2011-04-24 NOTE — ED Provider Notes (Signed)
Medical screening examination/treatment/procedure(s) were conducted as a shared visit with non-physician practitioner(s) and myself.  I personally evaluated the patient during the encounter  Ethelda Chick, MD 04/24/11 1540

## 2011-04-24 NOTE — ED Notes (Signed)
Pt much calmer and appears less anxious.  No N/V/D at this time.  She currently rates her pain at 5/10.

## 2011-04-24 NOTE — ED Notes (Signed)
Patient has not been brought over to [facility] yet.

## 2011-04-25 MED ORDER — FLORA-Q PO CAPS
1.0000 | ORAL_CAPSULE | Freq: Every day | ORAL | Status: DC
  Administered 2011-04-25 – 2011-04-28 (×4): 1 via ORAL
  Filled 2011-04-25 (×4): qty 1

## 2011-04-25 NOTE — Progress Notes (Signed)
Subjective: Feels a lot better but having some loose stool this a.m. Some dysuria.  Objective: Vital signs in last 24 hours: Temp:  [97.6 F (36.4 C)-98.1 F (36.7 C)] 97.6 F (36.4 C) (12/30 0500) Pulse Rate:  [68-104] 88  (12/30 1003) Resp:  [18-21] 18  (12/30 1003) BP: (119-140)/(45-98) 119/45 mmHg (12/30 1003) SpO2:  [95 %-99 %] 99 % (12/30 0500) Weight:  [61.236 kg (135 lb)] 135 lb (61.236 kg) (12/29 2209) Weight change:     Intake/Output from previous day: 12/29 0701 - 12/30 0700 In: 750 [I.V.:500; IV Piggyback:250] Out: 200 [Urine:200] Intake/Output this shift: Total I/O In: 300 [IV Piggyback:300] Out: -   General appearance: alert, cooperative and appears stated age Resp: clear to auscultation bilaterally Cardio: regular rate and rhythm, S1, S2 normal, no murmur, click, rub or gallop GI: soft, non-tender; bowel sounds normal; no masses,  no organomegaly Extremities: extremities normal, atraumatic, no cyanosis or edema Neurologic: Grossly normal   Lab Results:  Basename 04/24/11 1402  WBC 14.3*  HGB 13.0  HCT 37.8  PLT 272   BMET  Basename 04/24/11 1402  NA 134*  K 3.8  CL 101  CO2 22  GLUCOSE 113*  BUN 11  CREATININE 0.69  CALCIUM 10.1   CMET CMP     Component Value Date/Time   NA 134* 04/24/2011 1402   K 3.8 04/24/2011 1402   CL 101 04/24/2011 1402   CO2 22 04/24/2011 1402   GLUCOSE 113* 04/24/2011 1402   BUN 11 04/24/2011 1402   CREATININE 0.69 04/24/2011 1402   CALCIUM 10.1 04/24/2011 1402   PROT 7.0 04/19/2011 1015   ALBUMIN 3.8 04/19/2011 1015   AST 18 04/19/2011 1015   ALT 16 04/19/2011 1015   ALKPHOS 57 04/19/2011 1015   BILITOT 0.6 04/19/2011 1015   GFRNONAA 87* 04/24/2011 1402   GFRAA >90 04/24/2011 1402     Studies/Results: Ct Abdomen Pelvis W Contrast  04/24/2011  *RADIOLOGY REPORT*  Clinical Data: Lower quadrant pain, history of diverticulitis  CT ABDOMEN AND PELVIS WITH CONTRAST  Technique:  Multidetector CT imaging  of the abdomen and pelvis was performed following the standard protocol during bolus administration of intravenous contrast.  Contrast: OMNIPAQUE IOHEXOL 300 MG/ML IV SOLN  Comparison: Report 04/01/2000 no images available  Findings: The lung bases are unremarkable.  Sagittal images of the spine shows disc space flattening at L4-L5 and L5-S1 level.  Mild disc bulge noted at L4-L5 and L5 S1 level.  Atherosclerotic calcifications of the abdominal aorta and the iliac arteries are noted.  Enhanced liver, spleen, pancreas and adrenal glands are unremarkable.  The kidneys show symmetrical enhancement.  Right kidney is malrotated. There is a large cyst arising from the mid pole of the right kidney extending inferiorly measures 8.5 x 6.1 cm best visualized in coronal image 46.  A cyst is noted in lower pole of the left kidney measures 1 cm.  Mild distended gallbladder without evidence of calcified gallstones.  No aortic aneurysm.  No hydronephrosis or hydroureter.  No small bowel obstruction.  No free abdominal air.  The terminal ileum is unremarkable.  There is moderate distention of the right colon and transverse colon with liquid stool.Few right colon diverticula without evidence of acute diverticulitis. Multiple diverticula are noted in the sigmoid colon.  Scattered diverticula are noted in left colon.  In axial image 64 and coronal image 22 there is a subtle stranding of the pericolonic fat in left lower quadrant.  Small amount  of fluid noted adjacent to proximal sigmoid colon in the coronal image 22.  Findings are highly suspicious for mild diverticulitis.  No definite diverticular abscess.  No definite extraluminal contrast material is noted.  The uterus and adnexa are unremarkable.  Small amount of free fluid noted in posterior cul-de-sac.  Moderate distended urinary bladder. No destructive bony lesions are noted within pelvis.  IMPRESSION:  1.  Multiple sigmoid colon diverticula are noted.  There is subtle  stranding of the pericolonic fat in the left lower quadrant with small amount of fluid  just inferior to proximal sigmoid colon. Findings are highly suspicious for mild diverticulitis.  No definite diverticular abscess noted. 2.  There is a large cyst arising from mid pole of the right kidney extending inferiorly measures at least 8.4 x 6.1 cm best visualized in coronal image 50 3.  No small bowel obstruction. 4.  There is moderate distention with liquid stool of the right colon and transverse colon. 5.  Small amount of free fluid noted in the posterior pelvis. Moderate distended urinary bladder.  Original Report Authenticated By: Natasha Mead, M.D.    Medications: I have reviewed the patient's current medications.     [name] antiseptic oral rinse  15 mL Mouth Rinse q12n4p  . chlorhexidine  15 mL Mouth Rinse BID  . ciprofloxacin  400 mg Intravenous BID  . enoxaparin  40 mg Subcutaneous Daily  .  HYDROmorphone (DILAUDID) injection  1 mg Intravenous Once  .  HYDROmorphone (DILAUDID) injection  1 mg Intravenous Once  .  HYDROmorphone (DILAUDID) injection  1 mg Intravenous Once  . LORazepam  0.5 mg Intravenous Once  . LORazepam  1 mg Oral Once  . metoCLOPramide (REGLAN) injection  10 mg Intravenous Once  . metronidazole  500 mg Intravenous Q8H  . olmesartan  10 mg Oral Daily  . ondansetron  4 mg Intravenous Once  . ondansetron (ZOFRAN) IV  4 mg Intravenous Once  . sodium chloride  500 mL Intravenous STAT     Assessment/Plan:  Active Problems:  Diverticulitis-clinically improving.  i am worried about risk for c.diff given recent antibiotics prior to this episode. Add probiotic and follow carefully.  Nausea-better/   LOS: 1 day   Amarionna Arca A, MD 04/25/2011, 11:01 AM

## 2011-04-25 NOTE — ED Provider Notes (Signed)
Medical screening examination/treatment/procedure(s) were conducted as a shared visit with non-physician practitioner(s) and myself.  I personally evaluated the patient during the encounter  Pt seen and examined.  LLQ tenderness on exam, no distension, no gaurding or rebound.  CT ordered to evaluate for diverticulitis  Ethelda Chick, MD 04/25/11 [phone]

## 2011-04-25 NOTE — ED Provider Notes (Signed)
Medical screening examination/treatment/procedure(s) were performed by non-physician practitioner and as supervising physician I was immediately available for consultation/collaboration.  Doug Sou, MD 04/25/11 [phone]

## 2011-04-26 DIAGNOSIS — I10 Essential (primary) hypertension: Secondary | ICD-10-CM | POA: Diagnosis present

## 2011-04-26 LAB — CBC
MCV: 90.7 fL (ref 78.0–100.0)
Platelets: 214 10*3/uL (ref 150–400)
RDW: 14.1 % (ref 11.5–15.5)
WBC: 8.4 10*3/uL (ref 4.0–10.5)

## 2011-04-26 LAB — BASIC METABOLIC PANEL
CO2: 24 mEq/L (ref 19–32)
Calcium: 9.4 mg/dL (ref 8.4–10.5)
Creatinine, Ser: 0.79 mg/dL (ref 0.50–1.10)
GFR calc Af Amer: >90 mL/min (ref >90)
GFR calc non Af Amer: 83 mL/min — ABNORMAL LOW (ref >90)
Sodium: 139 mEq/L (ref 135–145)

## 2011-04-26 NOTE — Progress Notes (Signed)
04-26-11 UR completed. Sonora Catlin RN BSN  

## 2011-04-26 NOTE — Progress Notes (Signed)
Subjective: Feels a little better.  Tolerated low residue diet.  Persistent LLQ tenderness.  Stools are small, frequent but denies diarrhea.  Neck pain which she relates to recent dental issues.  Objective: Vital signs in last 24 hours: Temp:  [97.7 F (36.5 C)-98.2 F (36.8 C)] 98.2 F (36.8 C) (12/31 0525) Pulse Rate:  [79-88] 87  (12/31 0525) Resp:  [18] 18  (12/31 0525) BP: (99-119)/(30-64) 119/64 mmHg (12/31 0525) SpO2:  [98 %-100 %] 99 % (12/31 0525) Weight change:  Last BM Date: 04/25/11 (small amt)  CBG (last 3)  No results found for this basename: GLUCAP:3 in the last 72 hours  Intake/Output from previous day: 12/30 0701 - 12/31 0700 In: 2000 [P.O.:840; I.V.:760; IV Piggyback:400] Out: 2850 [Urine:2850] Intake/Output this shift: Total I/O In: -  Out: 1650 [Urine:1650]  General appearance: alert and no distress Eyes: no scleral icterus Throat: oropharynx moist without erythema Resp: clear to auscultation bilaterally Cardio: frequent ectopy with grade 3/6 HSM over apex GI: soft, non-tender; bowel sounds normal; no masses,  no organomegaly Extremities: no clubbing, cyanosis or edema   Lab Results:  Basename 04/24/11 1402  NA 134*  K 3.8  CL 101  CO2 22  GLUCOSE 113*  BUN 11  CREATININE 0.69  CALCIUM 10.1  MG --  PHOS --   No results found for this basename: AST:2,ALT:2,ALKPHOS:2,BILITOT:2,PROT:2,ALBUMIN:2 in the last 72 hours  Basename 04/24/11 1402  WBC 14.3*  NEUTROABS 12.2*  HGB 13.0  HCT 37.8  MCV 87.7  PLT 272   No results found for this basename: INR, PROTIME   No results found for this basename: CKTOTAL:3,CKMB:3,CKMBINDEX:3,TROPONINI:3 in the last 72 hours No results found for this basename: TSH,T4TOTAL,FREET3,T3FREE,THYROIDAB in the last 72 hours No results found for this basename: VITAMINB12:2,FOLATE:2,FERRITIN:2,TIBC:2,IRON:2,RETICCTPCT:2 in the last 72 hours  Studies/Results: Ct Abdomen Pelvis W Contrast  04/24/2011  *RADIOLOGY  REPORT*  Clinical Data: Lower quadrant pain, history of diverticulitis  CT ABDOMEN AND PELVIS WITH CONTRAST  Technique:  Multidetector CT imaging of the abdomen and pelvis was performed following the standard protocol during bolus administration of intravenous contrast.  Contrast: OMNIPAQUE IOHEXOL 300 MG/ML IV SOLN  Comparison: Report 04/01/2000 no images available  Findings: The lung bases are unremarkable.  Sagittal images of the spine shows disc space flattening at L4-L5 and L5-S1 level.  Mild disc bulge noted at L4-L5 and L5 S1 level.  Atherosclerotic calcifications of the abdominal aorta and the iliac arteries are noted.  Enhanced liver, spleen, pancreas and adrenal glands are unremarkable.  The kidneys show symmetrical enhancement.  Right kidney is malrotated. There is a large cyst arising from the mid pole of the right kidney extending inferiorly measures 8.5 x 6.1 cm best visualized in coronal image 46.  A cyst is noted in lower pole of the left kidney measures 1 cm.  Mild distended gallbladder without evidence of calcified gallstones.  No aortic aneurysm.  No hydronephrosis or hydroureter.  No small bowel obstruction.  No free abdominal air.  The terminal ileum is unremarkable.  There is moderate distention of the right colon and transverse colon with liquid stool.Few right colon diverticula without evidence of acute diverticulitis. Multiple diverticula are noted in the sigmoid colon.  Scattered diverticula are noted in left colon.  In axial image 64 and coronal image 22 there is a subtle stranding of the pericolonic fat in left lower quadrant.  Small amount of fluid noted adjacent to proximal sigmoid colon in the coronal image 22.  Findings  are highly suspicious for mild diverticulitis.  No definite diverticular abscess.  No definite extraluminal contrast material is noted.  The uterus and adnexa are unremarkable.  Small amount of free fluid noted in posterior cul-de-sac.  Moderate distended urinary  bladder. No destructive bony lesions are noted within pelvis.  IMPRESSION:  1.  Multiple sigmoid colon diverticula are noted.  There is subtle stranding of the pericolonic fat in the left lower quadrant with small amount of fluid  just inferior to proximal sigmoid colon. Findings are highly suspicious for mild diverticulitis.  No definite diverticular abscess noted. 2.  There is a large cyst arising from mid pole of the right kidney extending inferiorly measures at least 8.4 x 6.1 cm best visualized in coronal image 50 3.  No small bowel obstruction. 4.  There is moderate distention with liquid stool of the right colon and transverse colon. 5.  Small amount of free fluid noted in the posterior pelvis. Moderate distended urinary bladder.  Original Report Authenticated By: Natasha Mead, M.D.     Medications: Scheduled:   . antiseptic oral rinse  15 mL Mouth Rinse q12n4p  . chlorhexidine  15 mL Mouth Rinse BID  . ciprofloxacin  400 mg Intravenous BID  . enoxaparin  40 mg Subcutaneous Daily  . Flora-Q  1 capsule Oral Daily  . metronidazole  500 mg Intravenous Q8H  . olmesartan  10 mg Oral Daily   Continuous:   . sodium chloride 40 mL/hr at 04/25/11 2340    Assessment/Plan: Active Problems: 1. Diverticulitis- improving though she remains tender.  Will continue IV antibiotics for another 24 hours and consider transition to po antibiotics if improving. Repeat CBC. 2. Neck Pain- Will use Tylenol prn.  Getting OOB may help. 3. Nausea- resolved. 4. HTN- Continue Benicar.  Check BMET. 5. Disposition- possible discharge in 1-2 days if stable.      LOS: 2 days   Shermeka Rutt,W DOUGLAS 04/26/2011, 6:46 AM

## 2011-04-27 DIAGNOSIS — F419 Anxiety disorder, unspecified: Secondary | ICD-10-CM | POA: Diagnosis present

## 2011-04-27 DIAGNOSIS — E876 Hypokalemia: Secondary | ICD-10-CM | POA: Diagnosis not present

## 2011-04-27 MED ORDER — METRONIDAZOLE 500 MG PO TABS
500.0000 mg | ORAL_TABLET | Freq: Three times a day (TID) | ORAL | Status: DC
  Administered 2011-04-27 – 2011-04-28 (×4): 500 mg via ORAL
  Filled 2011-04-27 (×6): qty 1

## 2011-04-27 MED ORDER — POTASSIUM CHLORIDE CRYS ER 20 MEQ PO TBCR
40.0000 meq | EXTENDED_RELEASE_TABLET | Freq: Once | ORAL | Status: AC
  Administered 2011-04-27: 40 meq via ORAL
  Filled 2011-04-27: qty 2

## 2011-04-27 MED ORDER — DOCUSATE SODIUM 100 MG PO CAPS
100.0000 mg | ORAL_CAPSULE | Freq: Every day | ORAL | Status: DC
  Administered 2011-04-27: 100 mg via ORAL

## 2011-04-27 MED ORDER — CIPROFLOXACIN HCL 500 MG PO TABS
500.0000 mg | ORAL_TABLET | Freq: Two times a day (BID) | ORAL | Status: DC
  Administered 2011-04-27 – 2011-04-28 (×3): 500 mg via ORAL
  Filled 2011-04-27 (×5): qty 1

## 2011-04-27 NOTE — Progress Notes (Signed)
Subjective: Uncomfortable overnight with increased gas.  Received Morphine around 2:00am.  Feels better this am.  No diarrhea.  Feels like it is difficult to have a BM and would like stool softener.  Increased anxiety overnight as well.  Objective: Vital signs in last 24 hours: Temp:  [98 F (36.7 C)-98.7 F (37.1 C)] 98 F (36.7 C) (01/01 0601) Pulse Rate:  [77-90] 77  (01/01 0601) Resp:  [16-18] 18  (01/01 0601) BP: (118-132)/(45-73) 132/57 mmHg (01/01 0601) SpO2:  [98 %-99 %] 98 % (01/01 0601) Weight:  [60.9 kg (134 lb 4.2 oz)] 134 lb 4.2 oz (60.9 kg) (01/01 0601) Weight change:  Last BM Date: 04/26/11  CBG (last 3)  No results found for this basename: GLUCAP:3 in the last 72 hours  Intake/Output from previous day: 12/31 0701 - 01/01 0700 In: 1499 [I.V.:1499] Out: 1651 [Urine:1650; Stool:1] Intake/Output this shift:    General appearance: alert and no distress Eyes: no scleral icterus Throat: oropharynx moist without erythema Resp: clear to auscultation bilaterally Cardio: regular rate and rhythm and grade 3/6 holosystolic murmur over apex GI: soft; bowel sounds normal; no masses,  no organomegaly; left lower quadrant tenderness slightly improved.  No rebound or guarding. Extremities: no clubbing, cyanosis or edema   Lab Results:  [facility] 04/26/11 0946 04/24/11 1402  NA 139 134*  K 3.2* 3.8  CL 105 101  CO2 24 22  GLUCOSE 136* 113*  BUN 7 11  CREATININE 0.79 0.69  CALCIUM 9.4 10.1  MG -- --  PHOS -- --   No results found for this basename: AST:2,ALT:2,ALKPHOS:2,BILITOT:2,PROT:2,ALBUMIN:2 in the last 72 hours  Basename 04/26/11 0946 04/24/11 1402  WBC 8.4 14.3*  NEUTROABS -- 12.2*  HGB 11.1* 13.0  HCT 33.3* 37.8  MCV 90.7 87.7  PLT 214 272   No results found for this basename: INR, PROTIME   No results found for this basename: CKTOTAL:3,CKMB:3,CKMBINDEX:3,TROPONINI:3 in the last 72 hours No results found for this basename:  TSH,T4TOTAL,FREET3,T3FREE,THYROIDAB in the last 72 hours No results found for this basename: VITAMINB12:2,FOLATE:2,FERRITIN:2,TIBC:2,IRON:2,RETICCTPCT:2 in the last 72 hours  Studies/Results: No results found.   Medications: Scheduled:   . antiseptic oral rinse  15 mL Mouth Rinse q12n4p  . chlorhexidine  15 mL Mouth Rinse BID  . ciprofloxacin  400 mg Intravenous BID  . enoxaparin  40 mg Subcutaneous Daily  . Flora-Q  1 capsule Oral Daily  . metronidazole  500 mg Intravenous Q8H  . olmesartan  10 mg Oral Daily   Continuous:   . sodium chloride 40 mL/hr at 04/26/11 1430  acetaminophen, alum & mag hydroxide-simeth, LORazepam, morphine, ondansetron (ZOFRAN) IV, ondansetron, polyethylene glycol  Assessment/Plan: Active Problems: 1. Diverticulitis- slowly improving.  Change antibiotics to po.  Monitor for 24 hours given ongoing tenderness and narcotic requirement.  Add stool softener.  2. Hypertension- well controlled.  Continue current medication 3. Anxiety- continue lorazepam prn 4. Hypokalemia- replete po. 5. Disposition- anticipate discharge tomorrow if pain improved and tolerating po antibiotics.    LOS: 3 days   Donovon Micheletti,W DOUGLAS 04/27/2011, 9:49 AM

## 2011-04-28 LAB — BASIC METABOLIC PANEL
Chloride: 106 mEq/L (ref 96–112)
GFR calc Af Amer: >90 mL/min (ref >90)
GFR calc non Af Amer: 86 mL/min — ABNORMAL LOW (ref >90)
Glucose, Bld: 107 mg/dL — ABNORMAL HIGH (ref 70–99)
Potassium: 3.7 mEq/L (ref 3.5–5.1)
Sodium: 136 mEq/L (ref 135–145)

## 2011-04-28 LAB — CBC
Hemoglobin: 10.7 g/dL — ABNORMAL LOW (ref 12.0–15.0)
MCHC: 32.9 g/dL (ref 30.0–36.0)
WBC: 6.2 10*3/uL (ref 4.0–10.5)

## 2011-04-28 MED ORDER — DSS 100 MG PO CAPS: 100.0000 mg | ORAL_CAPSULE | Freq: Every day | ORAL | Status: AC

## 2011-04-28 MED ORDER — METRONIDAZOLE 500 MG PO TABS: 500.0000 mg | ORAL_TABLET | Freq: Three times a day (TID) | ORAL | Status: AC

## 2011-04-28 MED ORDER — FLORA-Q PO CAPS: 1.0000 | ORAL_CAPSULE | Freq: Every day | ORAL | Status: DC

## 2011-04-28 MED ORDER — POLYETHYLENE GLYCOL 3350 17 G PO PACK: 17.0000 g | PACK | Freq: Every day | ORAL | Status: AC | PRN

## 2011-04-28 MED ORDER — CIPROFLOXACIN HCL 500 MG PO TABS: 500.0000 mg | ORAL_TABLET | Freq: Two times a day (BID) | ORAL | Status: AC

## 2011-04-28 NOTE — Discharge Summary (Signed)
DISCHARGE SUMMARY  SHALENA EZZELL  MR#: [id number]  DOB:[birth year]  Date of Admission: 04/24/2011 Date of Discharge: 04/28/2011  Attending Physician:Markell Sciascia,W DOUGLAS  Patient's AOZ:[id number] [name], MD  Consults: none  Discharge Diagnoses: Active Problems:  Diverticulitis  Nausea  Hypertension  Hypokalemia  Anxiety  Past Medical History: Hypertension Goiter  Diverticulitis  GERD  Hemorrhoids  Aortic Stenosis  MVP  Hyperlipidemia  Past Surgical History: Kidney surgery  Goiter bx  Bladder surgery  Last echo in last year she says it was stable  Discharge Medications: Current Discharge Medication List    START taking these medications   Details  ciprofloxacin (CIPRO) 500 MG tablet Take 1 tablet (500 mg total) by mouth 2 (two) times daily. Qty: 20 tablet, Refills: 0    docusate sodium 100 MG CAPS Take 100 mg by mouth daily. Qty: 10 capsule    Flora-Q (FLORA-Q) CAPS Take 1 capsule by mouth daily.    metroNIDAZOLE (FLAGYL) 500 MG tablet Take 1 tablet (500 mg total) by mouth every 8 (eight) hours. Qty: 30 tablet, Refills: 0    polyethylene glycol (MIRALAX / GLYCOLAX) packet Take 17 g by mouth daily as needed (as needed for constipation (laxative)). Qty: 14 each      CONTINUE these medications which have NOT CHANGED   Details  acetaminophen (TYLENOL) 500 MG tablet Take 500 mg by mouth every 6 (six) hours as needed. For pain    irbesartan (AVAPRO) 75 MG tablet Take 75 mg by mouth at bedtime.      oxyCODONE-acetaminophen (PERCOCET) 5-325 MG per tablet Take 0.5 tablets by mouth every 4 (four) hours as needed. For pain      STOP taking these medications     amoxicillin (AMOXIL) 500 MG capsule         Hospital Procedures: Dg Chest 2 View 04/19/2011  *RADIOLOGY REPORT*  Clinical Data: Fever, chills, weakness.  CHEST - 2 VIEW  Comparison: None.  Findings: Heart and mediastinal contours are within normal limits. No focal opacities or effusions.  No acute bony  abnormality.  IMPRESSION: No active cardiopulmonary disease.  Original Report Authenticated By: Cyndie Chime, M.D.   Ct Abdomen Pelvis W Contrast 04/24/2011  *RADIOLOGY REPORT*  Clinical Data: Lower quadrant pain, history of diverticulitis  CT ABDOMEN AND PELVIS WITH CONTRAST  Technique:  Multidetector CT imaging of the abdomen and pelvis was performed following the standard protocol during bolus administration of intravenous contrast.  Contrast: OMNIPAQUE IOHEXOL 300 MG/ML IV SOLN  Comparison: Report 04/01/2000 no images available  Findings: The lung bases are unremarkable.  Sagittal images of the spine shows disc space flattening at L4-L5 and L5-S1 level.  Mild disc bulge noted at L4-L5 and L5 S1 level.  Atherosclerotic calcifications of the abdominal aorta and the iliac arteries are noted.  Enhanced liver, spleen, pancreas and adrenal glands are unremarkable.  The kidneys show symmetrical enhancement.  Right kidney is malrotated. There is a large cyst arising from the mid pole of the right kidney extending inferiorly measures 8.5 x 6.1 cm best visualized in coronal image 46.  A cyst is noted in lower pole of the left kidney measures 1 cm.  Mild distended gallbladder without evidence of calcified gallstones.  No aortic aneurysm.  No hydronephrosis or hydroureter.  No small bowel obstruction.  No free abdominal air.  The terminal ileum is unremarkable.  There is moderate distention of the right colon and transverse colon with liquid stool.Few right colon diverticula without evidence of acute diverticulitis. Multiple  diverticula are noted in the sigmoid colon.  Scattered diverticula are noted in left colon.  In axial image 64 and coronal image 22 there is a subtle stranding of the pericolonic fat in left lower quadrant.  Small amount of fluid noted adjacent to proximal sigmoid colon in the coronal image 22.  Findings are highly suspicious for mild diverticulitis.  No definite diverticular abscess.  No  definite extraluminal contrast material is noted.  The uterus and adnexa are unremarkable.  Small amount of free fluid noted in posterior cul-de-sac.  Moderate distended urinary bladder. No destructive bony lesions are noted within pelvis.  IMPRESSION:  1.  Multiple sigmoid colon diverticula are noted.  There is subtle stranding of the pericolonic fat in the left lower quadrant with small amount of fluid  just inferior to proximal sigmoid colon. Findings are highly suspicious for mild diverticulitis.  No definite diverticular abscess noted. 2.  There is a large cyst arising from mid pole of the right kidney extending inferiorly measures at least 8.4 x 6.1 cm best visualized in coronal image 50 3.  No small bowel obstruction. 4.  There is moderate distention with liquid stool of the right colon and transverse colon. 5.  Small amount of free fluid noted in the posterior pelvis. Moderate distended urinary bladder.  Original Report Authenticated By: Natasha Mead, M.D.    History of Present Illness: Ms. Speakman is a 71 year old white female with a history of diverticulosis, hypertension, and recent dental abscess he presented to the emergency department on 12/29 with a complaint of left lower quadrant abdominal pain. She states that she had a recent dental abscess treated with an antibiotic and dental work. She subsequently developed acute onset of sharp left lower quadrant abdominal pain. She's not had a bowel movement in about 4 days. Denied any blood in the stools. She did have nausea and vomiting associated with this. She presented emergency department where her white blood count was 14.3 and CT scan showed findings consistent with acute diverticulitis. She was admitted for further management  Hospital Course: Ms. Nodine was admitted to a medical bed.  She was treated with IV Flagyl and Cipro for diverticulitis. She had slow improvement of this with a continued need for morphine until the day prior to discharge.  She had no further nausea and vomiting. With the addition of a stool softener she had improvement in her bowel movements which was a satisfaction to her. She continues to have some left lower quadrant tenderness but this appears to be improving. Her leukocytosis has resolved. He is tolerating a regular low residue diet. She is felt stable for discharge home.  Day of Discharge Exam BP 106/51  Pulse 84  Temp(Src) 98.2 F (36.8 C) (Oral)  Resp 18  Ht 5\' 7"  (1.702 m)  Wt 62.1 kg (136 lb 14.5 oz)  BMI 21.44 kg/m2  SpO2 98%  Physical Exam: General appearance: alert and no distress Eyes: no scleral icterus Throat: oropharynx moist without erythema Resp: clear to auscultation bilaterally Cardio: regular rate and rhythm and grade 3/6 holosystolic murmur over apex GI: soft, bowel sounds normal; no masses, no organomegaly; mild tenderness over the left lower quadrant with no rebound or guarding Extremities: no clubbing, cyanosis or edema  Discharge Labs:  [facility] 04/28/11 0535 04/26/11 0946  NA 136 139  K 3.7 3.2*  CL 106 105  CO2 23 24  GLUCOSE 107* 136*  BUN 6 7  CREATININE 0.71 0.79  CALCIUM 9.5 9.4  MG -- --  PHOS -- --   No results found for this basename: AST:2,ALT:2,ALKPHOS:2,BILITOT:2,PROT:2,ALBUMIN:2 in the last 72 hours  Basename 04/28/11 0535 04/26/11 0946  WBC 6.2 8.4  NEUTROABS -- --  HGB 10.7* 11.1*  HCT 32.5* 33.3*  MCV 89.8 90.7  PLT 208 214   No results found for this basename: INR, PROTIME   Discharge instructions: Discharge Orders    Future Orders Please Complete By Expires   Diet - low sodium heart healthy      Scheduling Instructions:   Low fiber, low-residue diet. Limits seeds, nuts.   Increase activity slowly      Discharge instructions      Comments:   Call if increased abdominal pain, temperature above 101.5, severe diarrhea.          Follow-up with dentist if tooth/jaw pain persist    Disposition: to home Follow-up Appts: Follow-up with  Dr. [name] at [facility] in 1 week.  Call for appointment.  Condition on Discharge: improved  Tests Needing Follow-up: none  Time with discharge activities: 35 minutes Signed: Rosevelt Luu,W DOUGLAS 04/28/2011, 7:39 AM

## 2011-04-29 ENCOUNTER — Other Ambulatory Visit: Payer: Medicare Other | Admitting: Internal Medicine

## 2011-05-19 ENCOUNTER — Encounter: Payer: Self-pay | Admitting: Internal Medicine

## 2011-12-29 ENCOUNTER — Encounter: Payer: Self-pay | Admitting: Internal Medicine

## 2012-01-13 ENCOUNTER — Encounter: Payer: Self-pay | Admitting: Internal Medicine

## 2012-01-13 ENCOUNTER — Ambulatory Visit (AMBULATORY_SURGERY_CENTER): Payer: Medicare Other | Admitting: *Deleted

## 2012-01-13 VITALS — Ht 67.0 in | Wt 135.0 lb

## 2012-01-13 DIAGNOSIS — Z1211 Encounter for screening for malignant neoplasm of colon: Secondary | ICD-10-CM

## 2012-01-13 MED ORDER — MOVIPREP 100 G PO SOLR: ORAL | Status: DC

## 2012-01-26 ENCOUNTER — Telehealth: Payer: Self-pay | Admitting: *Deleted

## 2012-01-26 NOTE — Telephone Encounter (Signed)
Pt. Called with c/o headache and was wondering if she could take tylenol.  I advised she could.

## 2012-01-27 ENCOUNTER — Encounter: Payer: Self-pay | Admitting: Internal Medicine

## 2012-01-27 ENCOUNTER — Ambulatory Visit (AMBULATORY_SURGERY_CENTER): Payer: Medicare Other | Admitting: Internal Medicine

## 2012-01-27 VITALS — BP 111/66 | HR 81 | Temp 98.6°F | Resp 17 | Ht 67.0 in | Wt 135.0 lb

## 2012-01-27 DIAGNOSIS — Z800 Family history of malignant neoplasm of digestive organs: Secondary | ICD-10-CM

## 2012-01-27 DIAGNOSIS — Z1211 Encounter for screening for malignant neoplasm of colon: Secondary | ICD-10-CM

## 2012-01-27 MED ORDER — SODIUM CHLORIDE 0.9 % IV SOLN: 500.0000 mL | INTRAVENOUS | Status: DC

## 2012-01-27 NOTE — Progress Notes (Signed)
Propofol per m smith CRNA, see scanned intra procedure report. All meds titrated per CRNA during procedure. ewm

## 2012-01-27 NOTE — Progress Notes (Signed)
Patient did not have preoperative order for IV antibiotic SSI prophylaxis. (G8918)  Patient did not experience any of the following events: a burn prior to discharge; a fall within the facility; wrong site/side/patient/procedure/implant event; or a hospital transfer or hospital admission upon discharge from the facility. (G8907)  

## 2012-01-27 NOTE — Patient Instructions (Addendum)
YOU HAD AN ENDOSCOPIC PROCEDURE TODAY AT THE [facility]: Refer to the procedure report that was given to you for any specific questions about what was found during the examination.  If the procedure report does not answer your questions, please call your gastroenterologist to clarify.  If you requested that your care partner not be given the details of your procedure findings, then the procedure report has been included in a sealed envelope for you to review at your convenience later.  YOU SHOULD EXPECT: Some feelings of bloating in the abdomen. Passage of more gas than usual.  Walking can help get rid of the air that was put into your GI tract during the procedure and reduce the bloating. If you had a lower endoscopy (such as a colonoscopy or flexible sigmoidoscopy) you may notice spotting of blood in your stool or on the toilet paper. If you underwent a bowel prep for your procedure, then you may not have a normal bowel movement for a few days.  DIET: Your first meal following the procedure should be a light meal and then it is ok to progress to your normal diet.  A half-sandwich or bowl of soup is an example of a good first meal.  Heavy or fried foods are harder to digest and may make you feel nauseous or bloated.  Likewise meals heavy in dairy and vegetables can cause extra gas to form and this can also increase the bloating.  Drink plenty of fluids but you should avoid alcoholic beverages for 24 hours.  ACTIVITY: Your care partner should take you home directly after the procedure.  You should plan to take it easy, moving slowly for the rest of the day.  You can resume normal activity the day after the procedure however you should NOT DRIVE or use heavy machinery for 24 hours (because of the sedation medicines used during the test).    SYMPTOMS TO REPORT IMMEDIATELY: A gastroenterologist can be reached at any hour.  During normal business hours, 8:30 AM to 5:00 PM Monday through Friday,  call [phone].  After hours and on weekends, please call the GI answering service at [phone] who will take a message and have the physician on call contact you.   Following lower endoscopy (colonoscopy or flexible sigmoidoscopy):  Excessive amounts of blood in the stool  Significant tenderness or worsening of abdominal pains  Swelling of the abdomen that is new, acute  Fever of 100F or higher  FOLLOW UP: If any biopsies were taken you will be contacted by phone or by letter within the next 1-3 weeks.  Call your gastroenterologist if you have not heard about the biopsies in 3 weeks.  Our staff will call the home number listed on your records the next business day following your procedure to check on you and address any questions or concerns that you may have at that time regarding the information given to you following your procedure. This is a courtesy call and so if there is no answer at the home number and we have not heard from you through the emergency physician on call, we will assume that you have returned to your regular daily activities without incident.  SIGNATURES/CONFIDENTIALITY: You and/or your care partner have signed paperwork which will be entered into your electronic medical record.  These signatures attest to the fact that that the information above on your After Visit Summary has been reviewed and is understood.  Full responsibility of the confidentiality of this   discharge information lies with you and/or your care-partner.   Thank-you for choosing us for your healthcare needs. 

## 2012-01-27 NOTE — Op Note (Signed)
[facility] [address] [place], [address]   COLONOSCOPY PROCEDURE REPORT  PATIENT: [name] [name], [name] [name]  MR#: [id number] BIRTHDATE: [birth year] , 71  yrs. old GENDER: Female ENDOSCOPIST: Hart Carwin, MD REFERRED BY:  Geoffry Paradise, M.D. PROCEDURE DATE:  01/27/2012 PROCEDURE:   Colonoscopy, screening ASA CLASS:   Class II INDICATIONS:patient's immediate family history of colon cancer and prior colonoscopy in 2001 and 2007. MEDICATIONS: MAC sedation, administered by CRNA and Propofol (Diprivan) 120 mg IV  DESCRIPTION OF PROCEDURE:   After the risks and benefits and of the procedure were explained, informed consent was obtained.  A digital rectal exam revealed no abnormalities of the rectum.    The LB [id number]  endoscope was introduced through the anus and advanced to the cecum, which was identified by both the appendix and ileocecal valve .  The quality of the prep was good, using MoviPrep .  The instrument was then slowly withdrawn as the colon was fully examined.     COLON FINDINGS: There was moderate diverticulosis noted in the descending colon and sigmoid colon with associated muscular hypertrophy.     Retroflexed views revealed no abnormalities. The scope was then withdrawn from the patient and the procedure completed.  COMPLICATIONS: There were no complications. ENDOSCOPIC IMPRESSION: There was moderate diverticulosis noted in the descending colon and sigmoid colon small internal hemorrhoids  RECOMMENDATIONS: High fiber diet   REPEAT EXAM: In 5 year(s)  for Colonoscopy.  cc:  _______________________________ eSignedHart Carwin, MD 01/27/2012 11:59 AM     PATIENT NAME:  [name] [name], [name] [name] MR#: [id number]

## 2012-01-28 ENCOUNTER — Telehealth: Payer: Self-pay | Admitting: *Deleted

## 2012-01-28 NOTE — Telephone Encounter (Signed)
  Follow up Call-  Call back number 01/27/2012  Post procedure Call Back phone  # [phone]  Permission to leave phone message Yes     Patient questions:  Do you have a fever, pain , or abdominal swelling? no Pain Score  0 *  Have you tolerated food without any problems? yes  Have you been able to return to your normal activities? yes  Do you have any questions about your discharge instructions: Diet   no Medications  no Follow up visit  no  Do you have questions or concerns about your Care? no  Actions: * If pain score is 4 or above: No action needed, pain <4.

## 2012-04-20 ENCOUNTER — Telehealth: Payer: Self-pay | Admitting: Cardiovascular Disease

## 2012-04-20 NOTE — Telephone Encounter (Signed)
New problem:   C/O sob x 2 weeks more than usual. anxiety  attack last night.

## 2012-04-20 NOTE — Telephone Encounter (Signed)
Pt. states she is feeling better this morning but is concerned about her SOB. She wants to schedule an appt. with Dr. Clifton James as she is overdue to be seen. Appt. Scheduled for 05-26-12 @ 10 am., pt. aware.

## 2012-05-15 ENCOUNTER — Other Ambulatory Visit: Payer: Self-pay | Admitting: Nurse Practitioner

## 2012-05-15 ENCOUNTER — Ambulatory Visit (INDEPENDENT_AMBULATORY_CARE_PROVIDER_SITE_OTHER): Payer: Medicare Other | Admitting: Nurse Practitioner

## 2012-05-15 ENCOUNTER — Encounter: Payer: Self-pay | Admitting: Nurse Practitioner

## 2012-05-15 ENCOUNTER — Inpatient Hospital Stay (HOSPITAL_COMMUNITY)
Admission: EM | Admit: 2012-05-15 | Discharge: 2012-05-24 | DRG: 286 | Disposition: A | Discharge status: Home / Self Care | Payer: Medicare Other | Attending: Cardiology | Admitting: Cardiology

## 2012-05-15 ENCOUNTER — Emergency Department (HOSPITAL_COMMUNITY): Payer: Medicare Other

## 2012-05-15 ENCOUNTER — Encounter (HOSPITAL_COMMUNITY): Payer: Self-pay | Admitting: *Deleted

## 2012-05-15 VITALS — BP 110/70 | HR 136 | Ht 67.0 in | Wt 135.8 lb

## 2012-05-15 DIAGNOSIS — R011 Cardiac murmur, unspecified: Secondary | ICD-10-CM | POA: Diagnosis present

## 2012-05-15 DIAGNOSIS — R7989 Other specified abnormal findings of blood chemistry: Secondary | ICD-10-CM | POA: Diagnosis not present

## 2012-05-15 DIAGNOSIS — Z531 Procedure and treatment not carried out because of patient's decision for reasons of belief and group pressure: Secondary | ICD-10-CM | POA: Diagnosis not present

## 2012-05-15 DIAGNOSIS — I059 Rheumatic mitral valve disease, unspecified: Secondary | ICD-10-CM

## 2012-05-15 DIAGNOSIS — J9600 Acute respiratory failure, unspecified whether with hypoxia or hypercapnia: Secondary | ICD-10-CM | POA: Diagnosis not present

## 2012-05-15 DIAGNOSIS — I4891 Unspecified atrial fibrillation: Secondary | ICD-10-CM

## 2012-05-15 DIAGNOSIS — I340 Nonrheumatic mitral (valve) insufficiency: Secondary | ICD-10-CM

## 2012-05-15 DIAGNOSIS — I5021 Acute systolic (congestive) heart failure: Secondary | ICD-10-CM

## 2012-05-15 DIAGNOSIS — Z7982 Long term (current) use of aspirin: Secondary | ICD-10-CM

## 2012-05-15 DIAGNOSIS — J189 Pneumonia, unspecified organism: Secondary | ICD-10-CM

## 2012-05-15 DIAGNOSIS — E049 Nontoxic goiter, unspecified: Secondary | ICD-10-CM | POA: Diagnosis present

## 2012-05-15 DIAGNOSIS — IMO0001 Reserved for inherently not codable concepts without codable children: Secondary | ICD-10-CM

## 2012-05-15 DIAGNOSIS — I5023 Acute on chronic systolic (congestive) heart failure: Secondary | ICD-10-CM | POA: Diagnosis present

## 2012-05-15 DIAGNOSIS — I10 Essential (primary) hypertension: Secondary | ICD-10-CM | POA: Diagnosis present

## 2012-05-15 DIAGNOSIS — Z79899 Other long term (current) drug therapy: Secondary | ICD-10-CM

## 2012-05-15 DIAGNOSIS — K5792 Diverticulitis of intestine, part unspecified, without perforation or abscess without bleeding: Secondary | ICD-10-CM

## 2012-05-15 DIAGNOSIS — I509 Heart failure, unspecified: Secondary | ICD-10-CM | POA: Diagnosis present

## 2012-05-15 DIAGNOSIS — K5730 Diverticulosis of large intestine without perforation or abscess without bleeding: Secondary | ICD-10-CM | POA: Diagnosis present

## 2012-05-15 DIAGNOSIS — E876 Hypokalemia: Secondary | ICD-10-CM | POA: Diagnosis present

## 2012-05-15 DIAGNOSIS — R0989 Other specified symptoms and signs involving the circulatory and respiratory systems: Secondary | ICD-10-CM

## 2012-05-15 DIAGNOSIS — E785 Hyperlipidemia, unspecified: Secondary | ICD-10-CM | POA: Diagnosis present

## 2012-05-15 DIAGNOSIS — K219 Gastro-esophageal reflux disease without esophagitis: Secondary | ICD-10-CM | POA: Diagnosis present

## 2012-05-15 DIAGNOSIS — F419 Anxiety disorder, unspecified: Secondary | ICD-10-CM

## 2012-05-15 DIAGNOSIS — D649 Anemia, unspecified: Secondary | ICD-10-CM | POA: Diagnosis not present

## 2012-05-15 DIAGNOSIS — F411 Generalized anxiety disorder: Secondary | ICD-10-CM | POA: Diagnosis present

## 2012-05-15 DIAGNOSIS — J9601 Acute respiratory failure with hypoxia: Secondary | ICD-10-CM

## 2012-05-15 DIAGNOSIS — I5033 Acute on chronic diastolic (congestive) heart failure: Secondary | ICD-10-CM

## 2012-05-15 DIAGNOSIS — I359 Nonrheumatic aortic valve disorder, unspecified: Secondary | ICD-10-CM | POA: Diagnosis present

## 2012-05-15 HISTORY — DX: Procedure and treatment not carried out because of patient's decision for reasons of belief and group pressure: Z53.1

## 2012-05-15 HISTORY — DX: Nausea with vomiting, unspecified: R11.2

## 2012-05-15 HISTORY — DX: Reserved for inherently not codable concepts without codable children: IMO0001

## 2012-05-15 HISTORY — DX: Nonrheumatic mitral (valve) prolapse: I34.1

## 2012-05-15 HISTORY — DX: Other specified postprocedural states: Z98.890

## 2012-05-15 HISTORY — DX: Unspecified atrial fibrillation: I48.91

## 2012-05-15 LAB — CBC WITH DIFFERENTIAL/PLATELET
Basophils Absolute: 0 10*3/uL (ref 0.0–0.1)
Basophils Absolute: 0 10*3/uL (ref 0.0–0.1)
Basophils Relative: 0 % (ref 0–1)
Eosinophils Absolute: 0.1 10*3/uL (ref 0.0–0.7)
Eosinophils Relative: 1 % (ref 0–5)
HCT: 36.6 % (ref 36.0–46.0)
HCT: 36.4 % (ref 36.0–46.0)
Hemoglobin: 12.5 g/dL (ref 12.0–15.0)
Hemoglobin: 12.3 g/dL (ref 12.0–15.0)
Lymphocytes Relative: 27 % (ref 12–46)
Lymphocytes Relative: 31 % (ref 12–46)
Lymphs Abs: 2.8 10*3/uL (ref 0.7–4.0)
MCH: 30.1 pg (ref 26.0–34.0)
MCHC: 33.8 g/dL (ref 30.0–36.0)
MCV: 89.2 fL (ref 78.0–100.0)
Monocytes Absolute: 0.7 10*3/uL (ref 0.1–1.0)
Monocytes Absolute: 0.7 10*3/uL (ref 0.1–1.0)
Monocytes Relative: 8 % (ref 3–12)
Neutro Abs: 5.4 10*3/uL (ref 1.7–7.7)
Neutro Abs: 5.5 10*3/uL (ref 1.7–7.7)
Neutrophils Relative %: 64 % (ref 43–77)
Neutrophils Relative %: 60 % (ref 43–77)
Platelets: 200 10*3/uL (ref 150–400)
RBC: 4.08 MIL/uL (ref 3.87–5.11)
RDW: 13.8 % (ref 11.5–15.5)
RDW: 13.9 % (ref 11.5–15.5)
WBC: 8.5 10*3/uL (ref 4.0–10.5)
WBC: 9.2 10*3/uL (ref 4.0–10.5)

## 2012-05-15 LAB — COMPREHENSIVE METABOLIC PANEL
ALT: 123 U/L — ABNORMAL HIGH (ref 0–35)
AST: 98 U/L — ABNORMAL HIGH (ref 0–37)
Albumin: 3.6 g/dL (ref 3.5–5.2)
Alkaline Phosphatase: 117 U/L (ref 39–117)
BUN: 15 mg/dL (ref 6–23)
BUN: 15 mg/dL (ref 6–23)
CO2: 21 mEq/L (ref 19–32)
Calcium: 9.8 mg/dL (ref 8.4–10.5)
Calcium: 9.9 mg/dL (ref 8.4–10.5)
Chloride: 103 mEq/L (ref 96–112)
Creatinine, Ser: 0.84 mg/dL (ref 0.50–1.10)
GFR calc Af Amer: 76 mL/min — ABNORMAL LOW (ref >90)
GFR calc Af Amer: 80 mL/min — ABNORMAL LOW (ref >90)
GFR calc non Af Amer: 69 mL/min — ABNORMAL LOW (ref >90)
Glucose, Bld: 107 mg/dL — ABNORMAL HIGH (ref 70–99)
Glucose, Bld: 120 mg/dL — ABNORMAL HIGH (ref 70–99)
Potassium: 4.2 mEq/L (ref 3.5–5.1)
Sodium: 136 mEq/L (ref 135–145)
Total Bilirubin: 0.5 mg/dL (ref 0.3–1.2)
Total Protein: 6.6 g/dL (ref 6.0–8.3)
Total Protein: 6.5 g/dL (ref 6.0–8.3)

## 2012-05-15 LAB — URINALYSIS, ROUTINE W REFLEX MICROSCOPIC
Bilirubin Urine: NEGATIVE
Glucose, UA: NEGATIVE mg/dL
Ketones, ur: NEGATIVE mg/dL
pH: 5.5 (ref 5.0–8.0)

## 2012-05-15 LAB — PROTIME-INR
INR: 1.15 (ref 0.00–1.49)
INR: 1.24 (ref 0.00–1.49)
Prothrombin Time: 14.5 seconds (ref 11.6–15.2)
Prothrombin Time: 15.4 seconds — ABNORMAL HIGH (ref 11.6–15.2)

## 2012-05-15 LAB — URINE MICROSCOPIC-ADD ON

## 2012-05-15 LAB — APTT
aPTT: 36 seconds (ref 24–37)
aPTT: 153 seconds — ABNORMAL HIGH (ref 24–37)

## 2012-05-15 LAB — MRSA PCR SCREENING: MRSA by PCR: NEGATIVE

## 2012-05-15 LAB — MAGNESIUM: Magnesium: 2.1 mg/dL (ref 1.5–2.5)

## 2012-05-15 MED ORDER — NITROGLYCERIN 0.4 MG SL SUBL: 0.4000 mg | SUBLINGUAL_TABLET | SUBLINGUAL | Status: DC | PRN

## 2012-05-15 MED ORDER — ASPIRIN 81 MG PO CHEW
324.0000 mg | CHEWABLE_TABLET | ORAL | Status: AC
  Administered 2012-05-15: 324 mg via ORAL
  Filled 2012-05-15: qty 4

## 2012-05-15 MED ORDER — ASPIRIN 300 MG RE SUPP
300.0000 mg | RECTAL | Status: AC
  Filled 2012-05-15: qty 1

## 2012-05-15 MED ORDER — SODIUM CHLORIDE 0.9 % IV SOLN: 250.0000 mL | INTRAVENOUS | Status: DC | PRN

## 2012-05-15 MED ORDER — ACETAMINOPHEN 325 MG PO TABS: 650.0000 mg | ORAL_TABLET | ORAL | Status: DC | PRN

## 2012-05-15 MED ORDER — ATORVASTATIN CALCIUM 40 MG PO TABS
40.0000 mg | ORAL_TABLET | Freq: Every day | ORAL | Status: DC
  Administered 2012-05-15 – 2012-05-23 (×9): 40 mg via ORAL
  Filled 2012-05-15 (×10): qty 1

## 2012-05-15 MED ORDER — ONDANSETRON HCL 4 MG/2ML IJ SOLN: 4.0000 mg | Freq: Four times a day (QID) | INTRAMUSCULAR | Status: DC | PRN

## 2012-05-15 MED ORDER — LEVOFLOXACIN 500 MG PO TABS: 500.0000 mg | ORAL_TABLET | Freq: Every day | ORAL | Status: DC

## 2012-05-15 MED ORDER — ZOLPIDEM TARTRATE 5 MG PO TABS
5.0000 mg | ORAL_TABLET | Freq: Every evening | ORAL | Status: DC | PRN
  Administered 2012-05-15: 5 mg via ORAL
  Filled 2012-05-15: qty 1

## 2012-05-15 MED ORDER — ACETAMINOPHEN 325 MG PO TABS
650.0000 mg | ORAL_TABLET | ORAL | Status: DC | PRN
  Administered 2012-05-16 – 2012-05-24 (×6): 650 mg via ORAL
  Filled 2012-05-15 (×7): qty 2

## 2012-05-15 MED ORDER — ALPRAZOLAM 0.25 MG PO TABS
0.2500 mg | ORAL_TABLET | Freq: Three times a day (TID) | ORAL | Status: DC | PRN
  Administered 2012-05-16 – 2012-05-22 (×7): 0.25 mg via ORAL
  Filled 2012-05-15 (×8): qty 1

## 2012-05-15 MED ORDER — ASPIRIN EC 81 MG PO TBEC
81.0000 mg | DELAYED_RELEASE_TABLET | Freq: Every day | ORAL | Status: DC
  Administered 2012-05-15 – 2012-05-24 (×9): 81 mg via ORAL
  Filled 2012-05-15 (×10): qty 1

## 2012-05-15 MED ORDER — NEBIVOLOL HCL 10 MG PO TABS
10.0000 mg | ORAL_TABLET | Freq: Every day | ORAL | Status: DC
  Administered 2012-05-15 – 2012-05-21 (×6): 10 mg via ORAL
  Filled 2012-05-15 (×8): qty 1

## 2012-05-15 MED ORDER — DEXTROSE-NACL 5-0.45 % IV SOLN
INTRAVENOUS | Status: DC
  Administered 2012-05-15: 500 mL via INTRAVENOUS

## 2012-05-15 MED ORDER — ASPIRIN 81 MG PO TABS: 81.0000 mg | ORAL_TABLET | Freq: Every day | ORAL | Status: DC

## 2012-05-15 MED ORDER — ONDANSETRON HCL 4 MG/2ML IJ SOLN
4.0000 mg | Freq: Four times a day (QID) | INTRAMUSCULAR | Status: DC | PRN
  Administered 2012-05-16: 4 mg via INTRAVENOUS
  Filled 2012-05-15: qty 2

## 2012-05-15 MED ORDER — IRBESARTAN 75 MG PO TABS
75.0000 mg | ORAL_TABLET | Freq: Every day | ORAL | Status: DC
  Filled 2012-05-15 (×4): qty 1

## 2012-05-15 MED ORDER — ASPIRIN EC 81 MG PO TBEC: 81.0000 mg | DELAYED_RELEASE_TABLET | Freq: Every day | ORAL | Status: DC

## 2012-05-15 MED ORDER — LEVOFLOXACIN 500 MG PO TABS
500.0000 mg | ORAL_TABLET | Freq: Every day | ORAL | Status: DC
  Administered 2012-05-15 – 2012-05-16 (×2): 500 mg via ORAL
  Filled 2012-05-15 (×2): qty 1

## 2012-05-15 MED ORDER — DILTIAZEM HCL 100 MG IV SOLR
5.0000 mg/h | INTRAVENOUS | Status: DC
  Administered 2012-05-15 – 2012-05-17 (×3): 5 mg/h via INTRAVENOUS
  Filled 2012-05-15 (×3): qty 100

## 2012-05-15 MED ORDER — SODIUM CHLORIDE 0.9 % IJ SOLN: 3.0000 mL | INTRAMUSCULAR | Status: DC | PRN

## 2012-05-15 MED ORDER — DILTIAZEM HCL 100 MG IV SOLR: 5.0000 mg/h | INTRAVENOUS | Status: DC

## 2012-05-15 MED ORDER — HEPARIN BOLUS VIA INFUSION
3600.0000 [IU] | Freq: Once | INTRAVENOUS | Status: AC
  Administered 2012-05-15: 3600 [IU] via INTRAVENOUS
  Filled 2012-05-15: qty 3600

## 2012-05-15 MED ORDER — SODIUM CHLORIDE 0.9 % IJ SOLN
3.0000 mL | Freq: Two times a day (BID) | INTRAMUSCULAR | Status: DC
  Administered 2012-05-15 – 2012-05-24 (×12): 3 mL via INTRAVENOUS

## 2012-05-15 MED ORDER — SODIUM CHLORIDE 0.9 % IV BOLUS (SEPSIS)
500.0000 mL | Freq: Once | INTRAVENOUS | Status: AC
  Administered 2012-05-15: 500 mL via INTRAVENOUS

## 2012-05-15 MED ORDER — ATORVASTATIN CALCIUM 40 MG PO TABS: 40.0000 mg | ORAL_TABLET | Freq: Every day | ORAL | Status: DC

## 2012-05-15 MED ORDER — HEPARIN (PORCINE) IN NACL 100-0.45 UNIT/ML-% IJ SOLN
900.0000 [IU]/h | INTRAMUSCULAR | Status: DC
  Administered 2012-05-15 – 2012-05-16 (×2): 900 [IU]/h via INTRAVENOUS
  Filled 2012-05-15 (×3): qty 250

## 2012-05-15 NOTE — Progress Notes (Signed)
Called regarding repeat PTT >200. At 14:36, it was normal at 36. She has been started on heparin. There have been no interim changes in her medical condition or acute decompensation. No evidence of bleeding. Will recheck stat PTT. Nursing was also instructed to contact pharmacy to discuss whether heparin dosing will need to be adjusted. Dayna Dunn Pa-C

## 2012-05-15 NOTE — Progress Notes (Signed)
CRITICAL VALUE ALERT  Critical value received:  PTT >200  Date of notification:  05/15/2012  Time of notification:  2114  Critical value read back:yes  Nurse who received alert:  Wilmon Pali  MD notified (1st page):  Shea Evans, [place]  Time of first page:  2116  MD notified (2nd page):  Time of second page:  Responding MD:  Tama Headings  Time MD responded:  2120

## 2012-05-15 NOTE — H&P (Signed)
[name] [name]   05/15/2012 11:00 AM Office Visit  MRN: [id number]   Description: 72 year old female  Provider: Rosalio Macadamia, NP  Department: [facility]        Referring Provider     [name] Meo, MD      Diagnoses     Atrial fibrillation   - Primary    427.31      Reason for Visit     Atrial Fibrillation    Work in visit. Seen for Dr. Clifton [name].          Progress Notes     [name] Fredrickson, NP  05/15/2012 11:56 AM  Addendum    [name] [name] Date of Birth: [birth year] Medical Record #[id number]   History of Present Illness: [name] [name] is seen today for a work in visit. She is seen for Dr. Clifton [name]. She has MVP, GERD, HLD, HTN and AS. Last seen in March of 2012. Previously seen by [name] [name], [name] [name] and [name] [name].    Called right after Christmas and wanting to schedule an appointment due to dyspnea and anxiety. She was given a time for later this month. Thought her heart was acting up at that time and may have been out of rhythm.   She comes in today. She is here alone. Has not felt well for several weeks. Had heart racing and dyspnea initially. Then over the last week got "sick" and her breathing got even worse. Increased DOE and was short of breath at rest. Lightheaded and dizzy. Found to have pneumonia on Friday per CXR with her PCP. Started on aspirin and Bystolic. Still with palpitations. Less cough. Remains dizzy. Feels bad. No passing out spells. No real chest pain.      Current Outpatient Prescriptions on File Prior to Visit   Medication  Sig  Dispense  Refill   .  acetaminophen (TYLENOL) 500 MG tablet  Take 500 mg by mouth every 6 (six) hours as needed. For pain         .  nebivolol (BYSTOLIC) 10 MG tablet  Take 10 mg by mouth daily.         .  rosuvastatin (CRESTOR) 20 MG tablet  Take 20 mg by mouth daily.         .  Probiotic Product (PROBIOTIC DAILY PO)  Take 1 capsule by mouth daily.             Allergies   Allergen  Reactions    .  Sulfa Drugs Cross Reactors  Other (See Comments)       Unknown.   .  Aspirin  Rash   .  Naproxen Sodium  Swelling and Rash       Past Medical History   Diagnosis  Date   .  Hypertension     .  Diverticulitis     .  Anxiety     .  Heart murmur         MVP   .  Hyperlipidemia     .  Goiter     .  GERD (gastroesophageal reflux disease)     .  Aortic stenosis         Past Surgical History   Procedure  Date   .  Uterine fibroid surgery  1988   .  Ovarian cyst surgery  1988   .  Tubal ligation  1974       History   Smoking status   .  Never Smoker    Smokeless tobacco   .  Never Used       History   Alcohol Use  No       Comment: 1 drink a month       Family History   Problem  Relation  Age of Onset   .  Colon cancer  Father        Review of Systems: The review of systems is per the HPI.  All other systems were reviewed and are negative.   Physical Exam: BP 110/70  Pulse 136  Ht 5\' 7"  (1.702 m)  Wt 135 lb 12.8 oz (61.598 kg)  BMI 21.27 kg/m2 Oxygen sat is 100%. Heart rate up to 150's on exam.  Patient is pleasant and in no acute distress but she is short of breath at rest. Skin is warm and dry. Color is sallow.   HEENT is unremarkable. Normocephalic/atraumatic. PERRL. Sclera are nonicteric. Neck is supple. No masses. No JVD. Lungs are coarse. Cardiac exam shows an irregular rate and rhythm. She is quite tachycardic. Abdomen is soft. Extremities are without edema. Gait and ROM are intact. No gross neurologic deficits noted.     LABORATORY DATA: EKG today shows atrial fib with RVR. Tracing is reviewed with [name] [name] today.     Lab Results   Component  Value  Date     WBC  6.2  04/28/2011     HGB  10.7*  04/28/2011     HCT  32.5*  04/28/2011     PLT  208  04/28/2011     GLUCOSE  107*  04/28/2011     ALT  16  04/19/2011     AST  18  04/19/2011     NA  136  04/28/2011     K  3.7  04/28/2011     CL  106  04/28/2011     CREATININE  0.71  04/28/2011     BUN  6   04/28/2011     CO2  23  04/28/2011        Assessment / Plan: 1. Atrial fib with RVR - duration unknown and probably several weeks. Needs anticoagulation. Needs labs. Seen with [name] [name]. Will arrange for admission for rate control and anticoagulation. Will need echo updated once we get her rate controlled.    2. Pneumonia - on Levaquin. Would continue. Recheck CXR today.    3. AS - no recent echo. Needs heart rate controlled first.    4. HTN - BP is ok at this time.   Will arrange for admission today. Patient will call her daughter for transportation.    Patient is agreeable to this plan.      Previous Version      Electronic signature on 05/15/2012          Vitals - Last Recorded       BP Pulse Ht Wt BMI      110/70 136 5\' 7"  (1.702 m) 135 lb 12.8 oz (61.598 kg) 21.27 kg/m2         Vitals History Recorded       Orders Placed This Encounter     EKG 12-Lead [EKG1 Custom]         Discontinued Medications         Reason for Discontinue    irbesartan (AVAPRO) 75 MG tablet Change in therapy       Level of Service     LOS - NO CHARGE [  NC1]      Follow-up and Disposition     Routing History Recorded        All Charges for This Encounter       Code Description Service Date Service Provider Modifiers Quantity    93000 PR ELECTROCARDIOGRAM, COMPLETE 05/15/2012 [name] Abraham, MD   1    NC1 LOS - NO CHARGE 05/15/2012 [name] Macadamia, NP   1      Patient Instructions     We are going to admit you to the hospital today           Previous Visit         Provider Department Encounter #    04/20/2012  9:47 AM [name] Pons, MD [facility] [place] [id number]

## 2012-05-15 NOTE — ED Provider Notes (Signed)
History     CSN: [id number]  Arrival date & time 05/15/12  1339   First MD Initiated Contact with Patient 05/15/12 1353      Chief Complaint  Patient presents with  . Pneumonia  . Atrial Fibrillation    (Consider location/radiation/quality/duration/timing/severity/associated sxs/prior treatment) HPI Pt with newly diagnosed afib and pna as of 05/12/12. Pt was started on levaquin and she states her cough, sob has improved. Was scheduled to see Dr Riley Kill today and sent to ED for admission afib with RVR with soft BP.  Past Medical History  Diagnosis Date  . Hypertension   . Diverticulitis   . Anxiety   . Heart murmur     MVP  . Hyperlipidemia   . Goiter   . GERD (gastroesophageal reflux disease)   . Aortic stenosis     Past Surgical History  Procedure Date  . Uterine fibroid surgery 1988  . Ovarian cyst surgery 1988  . Tubal ligation 1974    Family History  Problem Relation Age of Onset  . Colon cancer Father     History  Substance Use Topics  . Smoking status: Never Smoker   . Smokeless tobacco: Never Used  . Alcohol Use: No     Comment: 1 drink a month    OB History    Grav Para Term Preterm Abortions TAB SAB Ect Mult Living                  Review of Systems  Constitutional: Negative for fever and chills.  Respiratory: Negative for cough and shortness of breath.   Cardiovascular: Negative for chest pain and palpitations.  Gastrointestinal: Negative for nausea, vomiting and abdominal pain.  Musculoskeletal: Negative for back pain.  Skin: Negative for rash and wound.  Neurological: Negative for dizziness, weakness, light-headedness, numbness and headaches.  All other systems reviewed and are negative.    Allergies  Sulfa drugs cross reactors; Aspirin; and Naproxen sodium  Home Medications   Current Outpatient Rx  Name  Route  Sig  Dispense  Refill  . ACETAMINOPHEN 500 MG PO TABS   Oral   Take 500 mg by mouth every 6 (six) hours as needed. For  pain         . ALPRAZOLAM 0.25 MG PO TABS   Oral   Take 0.25 mg by mouth 3 (three) times daily as needed. For anxiety         . ASPIRIN 81 MG PO TABS   Oral   Take 81 mg by mouth daily.         . IRBESARTAN 75 MG PO TABS   Oral   Take 75 mg by mouth daily.         [name] LEVOFLOXACIN 500 MG PO TABS   Oral   Take 500 mg by mouth daily. For 7 days. Started 05/12/12         . NEBIVOLOL HCL 10 MG PO TABS   Oral   Take 10 mg by mouth daily.         [name] PROBIOTIC DAILY PO   Oral   Take 1 capsule by mouth daily.         [name] ROSUVASTATIN CALCIUM 20 MG PO TABS   Oral   Take 20 mg by mouth daily.           BP 109/92  Pulse 135  SpO2 99%  Physical Exam  Nursing note and vitals reviewed. Constitutional: She is oriented to person, place,  and time. She appears well-developed and well-nourished. No distress.  HENT:  Head: Normocephalic and atraumatic.  Mouth/Throat: Oropharynx is clear and moist.  Eyes: EOM are normal. Pupils are equal, round, and reactive to light.  Neck: Normal range of motion. Neck supple.  Cardiovascular:       Tachy irreg irreg  Pulmonary/Chest: Effort normal and breath sounds normal. No respiratory distress. She has no wheezes. She has no rales. She exhibits no tenderness.  Abdominal: Soft. Bowel sounds are normal. She exhibits no distension and no mass. There is no tenderness. There is no rebound and no guarding.  Musculoskeletal: Normal range of motion. She exhibits no edema and no tenderness.       No lower ext swelling or tenderness  Neurological: She is alert and oriented to person, place, and time.       5/5 motor in all ext, sensation intact  Skin: Skin is warm and dry. No rash noted. No erythema.  Psychiatric: She has a normal mood and affect. Her behavior is normal.    ED Course  Procedures (including critical care time)   Labs Reviewed  CBC WITH DIFFERENTIAL  COMPREHENSIVE METABOLIC PANEL  URINALYSIS, ROUTINE W REFLEX MICROSCOPIC    PROTIME-INR  APTT  PRO B NATRIURETIC PEPTIDE  TROPONIN I   Dg Chest Portable 1 View  05/15/2012  *RADIOLOGY REPORT*  Clinical Data: Cough, congestion and shortness of breath.  PORTABLE CHEST - 1 VIEW  Comparison: 04/19/2011 and CT chest 07/07/2010.  Findings: Patient is slightly rotated.  There is leftward deviation of the trachea secondary to marked right thyroid enlargement. Heart is at the upper limits of normal in size.  Mild bibasilar air space disease, most notable in the right middle lobe, and small bilateral pleural effusions.  IMPRESSION: Mild bibasilar air space disease, right greater than left, with small bilateral pleural effusions.   Original Report Authenticated By: Leanna Battles, M.D.      No diagnosis found.   Date: 05/15/2012  Rate: 129  Rhythm: atrial fibrillation  QRS Axis: normal  Intervals: normal  ST/T Wave abnormalities: normal  Conduction Disutrbances:none  Narrative Interpretation:   Old EKG Reviewed: unchanged Last EKG 05/15/12 1219   MDM  Pt BP improved. Will give 500 cc bolus and consult cardiology about starting cardizem.         Loren Racer, MD 05/15/12 [phone]

## 2012-05-15 NOTE — Patient Instructions (Signed)
We are going to admit you to the hospital today. 

## 2012-05-15 NOTE — Progress Notes (Signed)
ANTICOAGULATION CONSULT NOTE - Initial Consult  Pharmacy Consult for Heparin Indication: atrial fibrillation with RVR  Allergies  Allergen Reactions  . Sulfa Drugs Cross Reactors Other (See Comments)    Unknown.  . Aspirin Rash  . Naproxen Sodium Swelling and Rash    Patient Measurements: Height: 5\' 7"  (170.2 cm) Weight: 134 lb 4.2 oz (60.9 kg) IBW/kg (Calculated) : 61.6  Heparin Dosing Weight: 60.9 kg  Vital Signs: Temp: 97.5 F (36.4 C) (01/20 1745) Temp src: Oral (01/20 1745) BP: 102/65 mmHg (01/20 1745) Pulse Rate: 120  (01/20 1745)  Labs:  Basename 05/15/12 1436 05/15/12 1435  HGB 12.5 --  HCT 36.6 --  PLT 193 --  APTT 36 --  LABPROT 14.5 --  INR 1.15 --  HEPARINUNFRC -- --  CREATININE 0.87 --  CKTOTAL -- --  CKMB -- --  TROPONINI -- <0.30    Estimated Creatinine Clearance: 57.8 ml/min (by C-G formula based on Cr of 0.87).   Medical History: Past Medical History  Diagnosis Date  . Hypertension   . Diverticulitis   . Anxiety   . Heart murmur     MVP  . Hyperlipidemia   . Goiter   . GERD (gastroesophageal reflux disease)   . Aortic stenosis     Medications:  Prescriptions prior to admission  Medication Sig Dispense Refill  . acetaminophen (TYLENOL) 500 MG tablet Take 500 mg by mouth every 6 (six) hours as needed. For pain      . ALPRAZolam (XANAX) 0.25 MG tablet Take 0.25 mg by mouth 3 (three) times daily as needed. For anxiety      . aspirin 81 MG tablet Take 81 mg by mouth daily.      . irbesartan (AVAPRO) 75 MG tablet Take 75 mg by mouth daily.      [name] levofloxacin (LEVAQUIN) 500 MG tablet Take 500 mg by mouth daily. For 7 days. Started 05/12/12      . nebivolol (BYSTOLIC) 10 MG tablet Take 10 mg by mouth daily.      . Probiotic Product (PROBIOTIC DAILY PO) Take 1 capsule by mouth daily.      . rosuvastatin (CRESTOR) 20 MG tablet Take 20 mg by mouth daily.        Assessment: 72 y/o female who was sent from Cardiology office to the ED today  for treatment of newly diagnosed Afib. Pharmacy consulted to begin heparin drip. CBC and renal function are normal at baseline.  Goal of Therapy:  Heparin level 0.3-0.7 units/ml Monitor platelets by anticoagulation protocol: Yes   Plan:  -Heparin 3600 unit IV bolus then infuse at 900 units/hr -Heparin level 8 hours after started -Daily heparin level and CBC while on heparin  [facility], [place].D., BCPS Clinical Pharmacist Pager: [phone] 05/15/2012 6:11 PM

## 2012-05-15 NOTE — Progress Notes (Signed)
Heparin was started at 19:30 and patient was given a bolus and weight based dosing. Would expect the aPTT to be elevated after bolus but it does not accurately correlate so close to the bolus.   Would recommend d/c repeat aPTT and wait for heparin level to be drawn at appropriate time.  Please call pharmacy if you have further questions.  [place], [address].D., BCPS Clinical Pharmacist Pager: [phone] 05/15/2012 10:42 PM

## 2012-05-15 NOTE — Progress Notes (Addendum)
[name] [name] Date of Birth: [birth year] Medical Record #[id number]  History of Present Illness: [name] [name] is seen today for a work in visit. She is seen for Dr. Clifton James. She has MVP, GERD, HLD, HTN and AS. Last seen in March of 2012. Previously seen by Dr. Donnie Aho, Dr. Tresa Endo and Dr. Katrinka Blazing.   Called right after Christmas and wanting to schedule an appointment due to dyspnea and anxiety. She was given a time for later this month.   She comes in today. She is here alone. Has not felt well for several weeks. Had heart racing and dsypnea. Then over the last week got "sick" and her breathing got even worse. Increased DOE. Lightheaded and dizzy. Found to have pneumonia on Friday per CXR. Started on aspirin and Bystolic. Still with palpitations. Less cough. Remains dizzy. Feels bad.   Current Outpatient Prescriptions on File Prior to Visit  Medication Sig Dispense Refill  . acetaminophen (TYLENOL) 500 MG tablet Take 500 mg by mouth every 6 (six) hours as needed. For pain      . nebivolol (BYSTOLIC) 10 MG tablet Take 10 mg by mouth daily.      . rosuvastatin (CRESTOR) 20 MG tablet Take 20 mg by mouth daily.      . Probiotic Product (PROBIOTIC DAILY PO) Take 1 capsule by mouth daily.        Allergies  Allergen Reactions  . Sulfa Drugs Cross Reactors Other (See Comments)    Unknown.  . Aspirin Rash  . Naproxen Sodium Swelling and Rash    Past Medical History  Diagnosis Date  . Hypertension   . Diverticulitis   . Anxiety   . Heart murmur     MVP  . Hyperlipidemia   . Goiter   . GERD (gastroesophageal reflux disease)   . Aortic stenosis     Past Surgical History  Procedure Date  . Uterine fibroid surgery 1988  . Ovarian cyst surgery 1988  . Tubal ligation 1974    History  Smoking status  . Never Smoker   Smokeless tobacco  . Never Used    History  Alcohol Use No    Comment: 1 drink a month    Family History  Problem Relation Age of Onset  . Colon cancer Father       Review of Systems: The review of systems is per the HPI.  All other systems were reviewed and are negative.  Physical Exam: BP 110/70  Pulse 136  Ht 5\' 7"  (1.702 m)  Wt 135 lb 12.8 oz (61.598 kg)  BMI 21.27 kg/m2 Oxygen sat is 100%. Heart rate up to 150's Patient is pleasant and in no acute distress but she is short of breath at rest. Skin is warm and dry. Color is sallow.   HEENT is unremarkable. Normocephalic/atraumatic. PERRL. Sclera are nonicteric. Neck is supple. No masses. No JVD. Lungs are coarse. Cardiac exam shows an irregular rate and rhythm. She is quite tachycardic. Abdomen is soft. Extremities are without edema. Gait and ROM are intact. No gross neurologic deficits noted.   LABORATORY DATA: EKG today shows atrial fib with RVR.   Lab Results  Component Value Date   WBC 6.2 04/28/2011   HGB 10.7* 04/28/2011   HCT 32.5* 04/28/2011   PLT 208 04/28/2011   GLUCOSE 107* 04/28/2011   ALT 16 04/19/2011   AST 18 04/19/2011   NA 136 04/28/2011   K 3.7 04/28/2011   CL 106 04/28/2011   CREATININE 0.71 04/28/2011  BUN 6 04/28/2011   CO2 23 04/28/2011     Assessment / Plan: 1. Atrial fib with RVR - duration probably several weeks. Needs anticoagulation. Needs labs. Seen with Dr. Riley Kill. Will arrange for admission for rate control and anticoagulation. Will need echo updated once we get her rate controlled.   2. Pneumonia - on Levaquin. Would continue.   3. AS - no recent echo. Needs heart rate controlled first.   4. HTN - BP is ok at this time.  Will arrange for admission today. Patient will call her daughter for transportation.   Patient is agreeable to this plan and will call if any problems develop in the interim.

## 2012-05-15 NOTE — ED Notes (Signed)
Pt reports that she was seen at her PCP and treated for pna.  Pt states that her pna is better but she is now in a-fib.  Went to doctor today and was told to come here for eval of afib.  Pt A/O x 4.  NAD noted.

## 2012-05-15 NOTE — H&P (Signed)
Patient seen and evaluated in the office with Ms. Tyrone Sage.  She presents with probably several days or more of rapid pulse, and noted to be in atrial fib with RVR.  She was not fully cognizant of this but now is.  She will require admission to the hospital, and iv diltiazem and anticoagulation have been added to her regimen.  I have also reviewed her admission labs and her rate is now controlled after intitiating therapy.  Nurses tell me she is now stable.  I saw, supervised, and agree with the admission plans.

## 2012-05-16 ENCOUNTER — Other Ambulatory Visit: Payer: Self-pay | Admitting: Nurse Practitioner

## 2012-05-16 ENCOUNTER — Encounter (HOSPITAL_COMMUNITY): Payer: Self-pay | Admitting: Neurology

## 2012-05-16 ENCOUNTER — Observation Stay (HOSPITAL_COMMUNITY): Payer: Medicare Other

## 2012-05-16 DIAGNOSIS — I319 Disease of pericardium, unspecified: Secondary | ICD-10-CM

## 2012-05-16 DIAGNOSIS — F411 Generalized anxiety disorder: Secondary | ICD-10-CM

## 2012-05-16 DIAGNOSIS — K5732 Diverticulitis of large intestine without perforation or abscess without bleeding: Secondary | ICD-10-CM

## 2012-05-16 DIAGNOSIS — I4891 Unspecified atrial fibrillation: Principal | ICD-10-CM | POA: Diagnosis present

## 2012-05-16 DIAGNOSIS — J189 Pneumonia, unspecified organism: Secondary | ICD-10-CM | POA: Diagnosis present

## 2012-05-16 LAB — TSH: TSH: 0.652 u[IU]/mL (ref 0.350–4.500)

## 2012-05-16 LAB — CBC
Hemoglobin: 11.4 g/dL — ABNORMAL LOW (ref 12.0–15.0)
MCH: 29.6 pg (ref 26.0–34.0)
MCV: 89.1 fL (ref 78.0–100.0)
RBC: 3.85 MIL/uL — ABNORMAL LOW (ref 3.87–5.11)

## 2012-05-16 LAB — TROPONIN I: Troponin I: <0.3 ng/mL (ref <0.30)

## 2012-05-16 LAB — BASIC METABOLIC PANEL
CO2: 21 mEq/L (ref 19–32)
Calcium: 9.6 mg/dL (ref 8.4–10.5)
GFR calc non Af Amer: 63 mL/min — ABNORMAL LOW (ref >90)
Sodium: 138 mEq/L (ref 135–145)

## 2012-05-16 LAB — HEPARIN LEVEL (UNFRACTIONATED): Heparin Unfractionated: 0.45 IU/mL (ref 0.30–0.70)

## 2012-05-16 MED ORDER — POTASSIUM CHLORIDE CRYS ER 20 MEQ PO TBCR
40.0000 meq | EXTENDED_RELEASE_TABLET | Freq: Once | ORAL | Status: AC
  Administered 2012-05-16: 40 meq via ORAL
  Filled 2012-05-16: qty 2

## 2012-05-16 MED ORDER — DEXTROSE 5 % IV SOLN
500.0000 mg | INTRAVENOUS | Status: DC
  Administered 2012-05-16 – 2012-05-18 (×3): 500 mg via INTRAVENOUS
  Filled 2012-05-16 (×5): qty 500

## 2012-05-16 MED ORDER — DEXTROSE 5 % IV SOLN
1.0000 g | Freq: Three times a day (TID) | INTRAVENOUS | Status: DC
  Administered 2012-05-16 – 2012-05-19 (×10): 1 g via INTRAVENOUS
  Filled 2012-05-16 (×16): qty 1

## 2012-05-16 MED ORDER — FUROSEMIDE 10 MG/ML IJ SOLN
20.0000 mg | Freq: Two times a day (BID) | INTRAMUSCULAR | Status: DC
  Administered 2012-05-16 – 2012-05-18 (×3): 20 mg via INTRAVENOUS
  Filled 2012-05-16 (×3): qty 2

## 2012-05-16 NOTE — Progress Notes (Signed)
ANTICOAGULATION CONSULT NOTE - Follow Up Consult  Pharmacy Consult for heparin Indication: atrial fibrillation  Labs:  Basename 05/16/12 1115 05/16/12 0500 05/15/12 2015 05/15/12 1930 05/15/12 1436  HGB -- 11.4* 12.3 -- --  HCT -- 34.3* 36.4 -- 36.6  PLT -- 174 200 -- 193  APTT -- -- 153* -- 36  LABPROT -- -- 15.4* -- 14.5  INR -- -- 1.24 -- 1.15  HEPARINUNFRC 0.45 0.51 -- -- --  CREATININE -- 0.90 0.84 -- 0.87  CKTOTAL -- -- -- -- --  CKMB -- -- -- -- --  TROPONINI <0.30 <0.30 -- <0.30 --   Goal of therapy: Heparin level 0.3-0.7  Assessment/Plan:  72yo female therapeutic on heparin (level 0.45) with initial dosing for Afib.  Will continue gtt at current rate. Daily heparin level and CBC while on heparin.  Mickeal Skinner PharmD BCPS 05/16/2012,1:09 PM

## 2012-05-16 NOTE — Consult Note (Signed)
PULMONARY  / CRITICAL CARE MEDICINE  Name: [name] [name] MRN: [id number] DOB: 1940/09/16    LOS: 1  REFERRING MD :  Riley Kill  CHIEF COMPLAINT:  Rt sided as dz LINES / TUBES:  CULTURES:  ANTIBIOTICS: 1-17 levaquin po>>  SIGNIFICANT EVENTS:  1-20 admit for afib with rvr 1/21- consult PNA  HISTORY OF PRESENT ILLNESS:   72 yo wf , never smoker, seen in her PCP office 1-17 with 3 days of non productive cough, rt chest wall pain, orthopnea and dyspnea. C X R  With rt lower asdz and given Levaquin. Subsequently developed afib with rvr and admitted and started on Cardizem drip for rate control. Pccm asked to consult for suspected pna.  PAST MEDICAL HISTORY :  Past Medical History  Diagnosis Date  . Hypertension   . Diverticulitis   . Anxiety   . Heart murmur     MVP  . Hyperlipidemia   . Goiter   . GERD (gastroesophageal reflux disease)   . Aortic stenosis    Past Surgical History  Procedure Date  . Uterine fibroid surgery 1988  . Ovarian cyst surgery 1988  . Tubal ligation 1974   Prior to Admission medications   Medication Sig Start Date End Date Taking? Authorizing Provider  acetaminophen (TYLENOL) 500 MG tablet Take 500 mg by mouth every 6 (six) hours as needed. For pain   Yes Historical Provider, MD  ALPRAZolam (XANAX) 0.25 MG tablet Take 0.25 mg by mouth 3 (three) times daily as needed. For anxiety   Yes Historical Provider, MD  aspirin 81 MG tablet Take 81 mg by mouth daily.   Yes Historical Provider, MD  irbesartan (AVAPRO) 75 MG tablet Take 75 mg by mouth daily.   Yes Historical Provider, MD  levofloxacin (LEVAQUIN) 500 MG tablet Take 500 mg by mouth daily. For 7 days. Started 05/12/12   Yes Historical Provider, MD  nebivolol (BYSTOLIC) 10 MG tablet Take 10 mg by mouth daily.   Yes Historical Provider, MD  Probiotic Product (PROBIOTIC DAILY PO) Take 1 capsule by mouth daily.   Yes Historical Provider, MD  rosuvastatin (CRESTOR) 20 MG tablet Take 20 mg by mouth  daily.   Yes Historical Provider, MD   Allergies  Allergen Reactions  . Sulfa Drugs Cross Reactors Other (See Comments)    Unknown.  . Aspirin Rash  . Naproxen Sodium Swelling and Rash    FAMILY HISTORY:  Family History  Problem Relation Age of Onset  . Colon cancer Father    SOCIAL HISTORY:  reports that she has never smoked. She has never used smokeless tobacco. She reports that she does not drink alcohol or use illicit drugs.  REVIEW OF SYSTEMS:  Taken extensively see hpi for positives.  INTERVAL HISTORY:   VITAL SIGNS: Temp:  [97.5 F (36.4 C)-98.7 F (37.1 C)] 97.6 F (36.4 C) (01/21 1151) Pulse Rate:  [45-148] 82  (01/21 1151) Resp:  [13-31] 19  (01/21 1151) BP: (89-108)/(41-85) 108/73 mmHg (01/21 1151) SpO2:  [90 %-100 %] 97 % (01/21 1151) Weight:  [60.9 kg (134 lb 4.2 oz)-62.8 kg (138 lb 7.2 oz)] 62.8 kg (138 lb 7.2 oz) (01/21 0500) HEMODYNAMICS:   VENTILATOR SETTINGS:   INTAKE / OUTPUT: Intake/Output      01/20 0701 - 01/21 0700 01/21 0701 - 01/22 0700   I.V. (mL/kg) 514 (8.2) 42 (0.7)   Total Intake(mL/kg) 514 (8.2) 42 (0.7)   Urine (mL/kg/hr) 500 (0.3) 500 (1.1)   Stool  1  Total Output 500 501   Net +14 -459          PHYSICAL EXAMINATION: General: WNWDWF NAD at rest Neuro:  Intact HEENT:  No LAN Cardiovascular:  hsir ir Lungs:  Rt base crackles, rhonchi, reduced rt abse Abdomen:  + bs Musculoskeletal: intact Skin:  warm   LABS: Cbc  Lab 05/16/12 0500 05/15/12 2015 05/15/12 1436  WBC 7.3 -- --  HGB 11.4* 12.3 12.5  HCT 34.3* 36.4 36.6  PLT 174 200 193    Chemistry   Lab 05/16/12 0500 05/15/12 2015 05/15/12 1436  NA 138 136 136  K 3.9 4.2 4.1  CL 107 103 105  CO2 21 21 20   BUN 13 15 15   CREATININE 0.90 0.84 0.87  CALCIUM 9.6 9.9 9.8  MG -- 2.1 --  PHOS -- -- --  GLUCOSE 96 120* 107*    Liver fxn  Lab 05/15/12 2015 05/15/12 1436  AST 98* 81*  ALT 123* 127*  ALKPHOS 117 125*  BILITOT 0.5 0.6  PROT 6.5 6.6  ALBUMIN  3.6 3.7   coags  Lab 05/15/12 2015 05/15/12 1436  APTT 153* 36  INR 1.24 1.15   Sepsis markers No results found for this basename: LATICACIDVEN:3,PROCALCITON:3 in the last 168 hours Cardiac markers  Lab 05/16/12 1115 05/16/12 0500 05/15/12 1930  CKTOTAL -- -- --  CKMB -- -- --  TROPONINI <0.30 <0.30 <0.30   BNP  Lab 05/15/12 1435  PROBNP 4622.0*   ABG No results found for this basename: PHART:3,PCO2ART:3,PO2ART:3,HCO3:3,TCO2:3 in the last 168 hours  CBG trend No results found for this basename: GLUCAP:5 in the last 168 hours  IMAGING: X-ray Chest Pa And Lateral  05/16/2012  *RADIOLOGY REPORT*  Clinical Data: Cough and shortness of breath.  CHEST - 2 VIEW  Comparison: Chest radiograph performed 05/15/2012  Findings: There is worsening right basilar airspace opacification, with a mildly increased small right pleural effusion.  A small left pleural effusion is again seen.  Findings raise concern for pneumonia.  No pneumothorax is identified.  Mild vascular congestion is seen; the appearance is less typical for edema.  The cardiomediastinal silhouette is mildly enlarged; calcification is noted in the aortic arch.  No acute osseous abnormalities are identified.  IMPRESSION:  1.  Worsening right basilar airspace opacification, with a mildly increased small right pleural effusion.  Findings raise concern for pneumonia. 2.  Mild vascular congestion and mild cardiomegaly; the appearance remains less typical for edema, though it is a possibility.  Small left pleural effusion also seen.   Original Report Authenticated By: Tonia Ghent, M.D.    Dg Chest Portable 1 View  05/15/2012  *RADIOLOGY REPORT*  Clinical Data: Cough, congestion and shortness of breath.  PORTABLE CHEST - 1 VIEW  Comparison: 04/19/2011 and CT chest 07/07/2010.  Findings: Patient is slightly rotated.  There is leftward deviation of the trachea secondary to marked right thyroid enlargement. Heart is at the upper limits of  normal in size.  Mild bibasilar air space disease, most notable in the right middle lobe, and small bilateral pleural effusions.  IMPRESSION: Mild bibasilar air space disease, right greater than left, with small bilateral pleural effusions.   Original Report Authenticated By: Leanna Battles, M.D.     WUJ:WJXB DIAGNOSES: Active Problems:  MITRAL VALVE PROLAPSE  DIVERTICULOSIS, COLON  CARDIAC MURMUR  Hypertension  Anxiety  Atrial fibrillation  Pneumonia, organism unspecified  ASSESSMENT / PLAN:  PULMONARY  ASSESSMENT: Hypoxia with presumed pna rt base Most concern is  effusion in setting new fib rvr and inherent diastolic dysfunction from this PLAN:   -O2 as needed -pulmonary toilet, IS -follow up c x r in am  - I will [place] rt base now at bedside -control rate -lasix to neg 1 liter goal -abx per flow  CARDIOVASCULAR  ASSESSMENT:  Afib HTN PLAN:  -per cards -control rate -echo -tsh wnl  RENAL Lab Results  Component Value Date   CREATININE 0.90 05/16/2012   CREATININE 0.84 05/15/2012   CREATININE 0.87 05/15/2012    ASSESSMENT:  Mild low K, likely effusion PLAN:   Lasix Chem in am  k supp  GASTROINTESTINAL  ASSESSMENT:  No acute issue PLAN:   -add ppi, until asp risk stratified  HEMATOLOGIC  ASSESSMENT:  Anemia, afib rvr PLAN:  -on heparin for a fib -monitor for hemoptysis with pna and anticoagulation.  INFECTIOUS  ASSESSMENT:   Presumed CAP PLAN:   -Consider change abx to ceftaz and Zithromax as IV, has been on levo since friday  -sputum if able -pending [place] results, may limit abx duration  ENDOCRINE  ASSESSMENT:  No acute issue PLAN:   cbg with am bmet  I have personally obtained a history, examined the patient, evaluated laboratory and imaging results, formulated the assessment and plan and placed orders.  Global: r/o effusion as main issue, will [place], control new fib per cards, lasix  Mcarthur Rossetti. Tyson Alias, MD, FACP Pgr: [phone] [place]  Pulmonary & Critical [facility]

## 2012-05-16 NOTE — Progress Notes (Signed)
ANTIBIOTIC CONSULT NOTE - INITIAL  Pharmacy Consult for ceftazidime, also on levaquin and azithromycin Indication: rule out pneumonia  Allergies  Allergen Reactions  . Sulfa Drugs Cross Reactors Other (See Comments)    Unknown.  . Aspirin Rash  . Naproxen Sodium Swelling and Rash    Patient Measurements: Height: 5\' 7"  (170.2 cm) Weight: 138 lb 7.2 oz (62.8 kg) IBW/kg (Calculated) : 61.6    Vital Signs: Temp: 98.1 F (36.7 C) (01/21 1509) Temp src: Oral (01/21 1151) BP: 80/64 mmHg (01/21 1509) Pulse Rate: 82  (01/21 1151) Intake/Output from previous day: 01/20 0701 - 01/21 0700 In: 514 [I.V.:514] Out: 500 [Urine:500] Intake/Output from this shift: Total I/O In: 42 [I.V.:42] Out: 701 [Urine:700; Stool:1]  Labs:  [facility] 05/16/12 0500 05/15/12 2015 05/15/12 1436  WBC 7.3 9.2 8.5  HGB 11.4* 12.3 12.5  PLT 174 200 193  LABCREA -- -- --  CREATININE 0.90 0.84 0.87   Estimated Creatinine Clearance: 56.6 ml/min (by C-G formula based on Cr of 0.9). No results found for this basename: VANCOTROUGH:2,VANCOPEAK:2,VANCORANDOM:2,GENTTROUGH:2,GENTPEAK:2,GENTRANDOM:2,TOBRATROUGH:2,TOBRAPEAK:2,TOBRARND:2,AMIKACINPEAK:2,AMIKACINTROU:2,AMIKACIN:2, in the last 72 hours   Microbiology: Recent Results (from the past 720 hour(s))  MRSA PCR SCREENING     Status: Normal   Collection Time   05/15/12  8:45 PM      Component Value Range Status Comment   MRSA by PCR NEGATIVE  NEGATIVE Final     Medical History: Past Medical History  Diagnosis Date  . Hypertension   . Diverticulitis   . Anxiety   . Heart murmur     MVP  . Hyperlipidemia   . Goiter   . GERD (gastroesophageal reflux disease)   . Aortic stenosis     Medications:  Prescriptions prior to admission  Medication Sig Dispense Refill  . acetaminophen (TYLENOL) 500 MG tablet Take 500 mg by mouth every 6 (six) hours as needed. For pain      . ALPRAZolam (XANAX) 0.25 MG tablet Take 0.25 mg by mouth 3 (three) times daily  as needed. For anxiety      . aspirin 81 MG tablet Take 81 mg by mouth daily.      . irbesartan (AVAPRO) 75 MG tablet Take 75 mg by mouth daily.      [name] levofloxacin (LEVAQUIN) 500 MG tablet Take 500 mg by mouth daily. For 7 days. Started 05/12/12      . nebivolol (BYSTOLIC) 10 MG tablet Take 10 mg by mouth daily.      . Probiotic Product (PROBIOTIC DAILY PO) Take 1 capsule by mouth daily.      . rosuvastatin (CRESTOR) 20 MG tablet Take 20 mg by mouth daily.       Assessment: [name] [name] is a 72 yo F on oral levaquin since Friday 05/12/12 to start ceftazidime and azithromycin for suspected PNA.  Her BP is 80/64. CXR: R basilar airspace opacity w/ small R pleural effusion.  WBC 7.3. Afebrile.  Dr. Tyson Alias to [place] today.  Creat 0.9. Creat cl ~ 57 ml/min.   Goal of Therapy:  Eradication of infection  Plan:  1. Ceftazidime 1 gm IV q8h 2. Also on Levaquin 500 mg PO q24 3. Also on azithromycin 500 mg IV q24 4. We will f/u renal function and culture data  Herby Abraham, Pharm.D. [phone] 05/16/2012 3:38 PM

## 2012-05-16 NOTE — Progress Notes (Signed)
Notified Dr. Terressa Koyanagi of changing pt level of care in EPIC. No new orders received.

## 2012-05-16 NOTE — Progress Notes (Signed)
BP 80/64 Dr Tyson Alias on unit and made aware. No new orders at this time

## 2012-05-16 NOTE — Progress Notes (Signed)
  Echocardiogram 2D Echocardiogram has been performed.  [name] [name] 05/16/2012, 3:38 PM

## 2012-05-16 NOTE — Progress Notes (Signed)
@   2114 notified by lab of critical aPtt >200. Paged Dayna Dunn,PA. Dunn to place stat order of aPtt to recheck. Notified pharmacy. Pharmacy aware of lab critical. Pharmacy stated that aPTT not accurate, will adjust dosing based on heparin level.

## 2012-05-16 NOTE — Progress Notes (Signed)
Utilization Review Completed.  05/16/2012  

## 2012-05-16 NOTE — Progress Notes (Signed)
Patient Name: [name] [name] Date of Encounter: 05/16/2012     Active Problems:  * No active hospital problems. *     SUBJECTIVE  Patient did not sleep very well.  Complains of just not feeling well after no sleep.  No pain.  HR better than it was.    CURRENT MEDS    . aspirin EC  81 mg Oral Daily  . atorvastatin  40 mg Oral q1800  . irbesartan  75 mg Oral Daily  . levofloxacin  500 mg Oral Daily  . nebivolol  10 mg Oral Daily  . sodium chloride  3 mL Intravenous Q12H    OBJECTIVE  Filed Vitals:   05/16/12 0405 05/16/12 0500 05/16/12 0627 05/16/12 0700  BP: 95/62 96/60 107/63 99/73  Pulse: 97 119 45 121  Temp: 97.7 F (36.5 C)     TempSrc: Oral     Resp: 13 31 23 18   Height:      Weight:  138 lb 7.2 oz (62.8 kg)    SpO2: 95% 95%      Intake/Output Summary (Last 24 hours) at 05/16/12 0829 Last data filed at 05/15/12 2140  Gross per 24 hour  Intake    500 ml  Output    500 ml  Net      0 ml   Filed Weights   05/15/12 1745 05/16/12 0500  Weight: 134 lb 4.2 oz (60.9 kg) 138 lb 7.2 oz (62.8 kg)    PHYSICAL EXAM  General: Pleasant, NAD. Neuro: Alert and oriented X 3. Moves all extremities spontaneously. Psych: Normal affect. HEENT:  Normal  Neck: Supple without bruits or JVD. Lungs:  Resp regular and unlabored, decreased BS in R base.   Cardiac:  Apical Systolic murmur n Abdomen: Soft, non-tender, non-distended, BS + x 4.  Extremities: No clubbing, cyanosis or edema. DP/PT/Radials 2+ and equal bilaterally.  Accessory Clinical Findings  CBC  Basename 05/16/12 0500 05/15/12 2015 05/15/12 1436  WBC 7.3 9.2 --  NEUTROABS -- 5.5 5.4  HGB 11.4* 12.3 --  HCT 34.3* 36.4 --  MCV 89.1 89.2 --  PLT 174 200 --   Basic Metabolic Panel  Basename 05/16/12 0500 05/15/12 2015  NA 138 136  K 3.9 4.2  CL 107 103  CO2 21 21  GLUCOSE 96 120*  BUN 13 15  CREATININE 0.90 0.84  CALCIUM 9.6 9.9  MG -- 2.1  PHOS -- --   Liver Function Tests  Basename  05/15/12 2015 05/15/12 1436  AST 98* 81*  ALT 123* 127*  ALKPHOS 117 125*  BILITOT 0.5 0.6  PROT 6.5 6.6  ALBUMIN 3.6 3.7   No results found for this basename: LIPASE:2,AMYLASE:2 in the last 72 hours Cardiac Enzymes  Basename 05/16/12 0500 05/15/12 1930 05/15/12 1435  CKTOTAL -- -- --  CKMB -- -- --  CKMBINDEX -- -- --  TROPONINI <0.30 <0.30 <0.30   BNP No components found with this basename: POCBNP:3 D-Dimer No results found for this basename: DDIMER:2 in the last 72 hours Hemoglobin A1C No results found for this basename: HGBA1C in the last 72 hours Fasting Lipid Panel No results found for this basename: CHOL,HDL,LDLCALC,TRIG,CHOLHDL,LDLDIRECT in the last 72 hours Thyroid Function Tests  Basename 05/15/12 2015  TSH 0.652  T4TOTAL --  T3FREE --  THYROIDAB --    TELE  Rate improved but still elevated.      Radiology/Studies  X-ray Chest Pa And Lateral  05/16/2012  *RADIOLOGY REPORT*  Clinical Data:  Cough and shortness of breath.  CHEST - 2 VIEW  Comparison: Chest radiograph performed 05/15/2012  Findings: There is worsening right basilar airspace opacification, with a mildly increased small right pleural effusion.  A small left pleural effusion is again seen.  Findings raise concern for pneumonia.  No pneumothorax is identified.  Mild vascular congestion is seen; the appearance is less typical for edema.  The cardiomediastinal silhouette is mildly enlarged; calcification is noted in the aortic arch.  No acute osseous abnormalities are identified.  IMPRESSION:  1.  Worsening right basilar airspace opacification, with a mildly increased small right pleural effusion.  Findings raise concern for pneumonia. 2.  Mild vascular congestion and mild cardiomegaly; the appearance remains less typical for edema, though it is a possibility.  Small left pleural effusion also seen.   Original Report Authenticated By: Tonia Ghent, M.D.    Dg Chest Portable 1 View  05/15/2012   *RADIOLOGY REPORT*  Clinical Data: Cough, congestion and shortness of breath.  PORTABLE CHEST - 1 VIEW  Comparison: 04/19/2011 and CT chest 07/07/2010.  Findings: Patient is slightly rotated.  There is leftward deviation of the trachea secondary to marked right thyroid enlargement. Heart is at the upper limits of normal in size.  Mild bibasilar air space disease, most notable in the right middle lobe, and small bilateral pleural effusions.  IMPRESSION: Mild bibasilar air space disease, right greater than left, with small bilateral pleural effusions.   Original Report Authenticated By: Leanna Battles, M.D.     ASSESSMENT AND PLAN  1.  Atrial fib with controlled ventricular response currently 2.  Abnormal LFTs 3.  Worsening CAP on Levaquin 4.  Remains on IV heparin--could consider novel agent, but will continue heparin pending thoughts about effusion.    Will try to switch to oral dilt today.    Signed, Shawnie Pons MD, [facility], FSCAI

## 2012-05-16 NOTE — Progress Notes (Signed)
ANTICOAGULATION CONSULT NOTE - Follow Up Consult  Pharmacy Consult for heparin Indication: atrial fibrillation  Labs:  Basename 05/16/12 0500 05/15/12 2015 05/15/12 1930 05/15/12 1436 05/15/12 1435  HGB -- 12.3 -- 12.5 --  HCT -- 36.4 -- 36.6 --  PLT -- 200 -- 193 --  APTT -- 153* -- 36 --  LABPROT -- 15.4* -- 14.5 --  INR -- 1.24 -- 1.15 --  HEPARINUNFRC 0.51 -- -- -- --  CREATININE -- 0.84 -- 0.87 --  CKTOTAL -- -- -- -- --  CKMB -- -- -- -- --  TROPONINI -- -- <0.30 -- <0.30    Assessment/Plan:  72yo female therapeutic on heparin with initial dosing for Afib (PTT >200 was likely due to proximity of lab to bolus).  Will continue gtt at current rate and confirm stable with additional level.  Colleen Can PharmD BCPS 05/16/2012,5:57 AM

## 2012-05-16 NOTE — Procedures (Signed)
[place] real time chest 1. Left lung, small to NO effusion 2. Rt lung large free flowing effusion with collapsed / atx lung flap that does not aerate with deep inspiration 3. No loculations  Will re eval in am with lasix will likely hold heparin and thora tomorrow afternoon  Mcarthur Rossetti. Tyson Alias, MD, FACP Pgr: [phone] [facility]

## 2012-05-17 DIAGNOSIS — I059 Rheumatic mitral valve disease, unspecified: Secondary | ICD-10-CM

## 2012-05-17 DIAGNOSIS — I509 Heart failure, unspecified: Secondary | ICD-10-CM

## 2012-05-17 DIAGNOSIS — I5021 Acute systolic (congestive) heart failure: Secondary | ICD-10-CM | POA: Diagnosis present

## 2012-05-17 LAB — BASIC METABOLIC PANEL
CO2: 24 mEq/L (ref 19–32)
Calcium: 9.6 mg/dL (ref 8.4–10.5)
Chloride: 107 mEq/L (ref 96–112)
Sodium: 140 mEq/L (ref 135–145)

## 2012-05-17 LAB — CBC
MCH: 30.2 pg (ref 26.0–34.0)
MCHC: 33.5 g/dL (ref 30.0–36.0)
Platelets: 175 10*3/uL (ref 150–400)
RDW: 14.1 % (ref 11.5–15.5)

## 2012-05-17 LAB — HEPATIC FUNCTION PANEL
Indirect Bilirubin: 0.3 mg/dL (ref 0.3–0.9)
Total Protein: 5.9 g/dL — ABNORMAL LOW (ref 6.0–8.3)

## 2012-05-17 MED ORDER — HEPARIN (PORCINE) IN NACL 100-0.45 UNIT/ML-% IJ SOLN
900.0000 [IU]/h | INTRAMUSCULAR | Status: DC
  Administered 2012-05-17 – 2012-05-19 (×3): 900 [IU]/h via INTRAVENOUS
  Filled 2012-05-17 (×4): qty 250

## 2012-05-17 MED ORDER — DILTIAZEM HCL ER COATED BEADS 120 MG PO CP24
120.0000 mg | ORAL_CAPSULE | Freq: Every day | ORAL | Status: DC
  Administered 2012-05-17 – 2012-05-21 (×4): 120 mg via ORAL
  Filled 2012-05-17 (×6): qty 1

## 2012-05-17 NOTE — Progress Notes (Signed)
SUBJECTIVE: Feeling tired. No chest pain or SOB.   BP 94/66  Pulse 82  Temp 97.5 F (36.4 C) (Oral)  Resp 18  Ht 5\' 7"  (1.702 m)  Wt 138 lb 7.2 oz (62.8 kg)  BMI 21.68 kg/m2  SpO2 97%  Intake/Output Summary (Last 24 hours) at 05/17/12 1610 Last data filed at 05/17/12 0700  Gross per 24 hour  Intake    328 ml  Output   1301 ml  Net   -973 ml    PHYSICAL EXAM General: Well developed, well nourished, in no acute distress. Alert and oriented x 3.  Psych:  Good affect, responds appropriately Neck: No JVD. No masses noted.  Lungs: Crackles right base. Clear left lung fields. No wheezes or rhonci noted.  Heart: Irreg irreg with loud systolic murmur noted.  Abdomen: Bowel sounds are present. Soft, non-tender.  Extremities: No lower extremity edema.   LABS: Basic Metabolic Panel:  Basename 05/17/12 0455 05/16/12 0500 05/15/12 2015  NA 140 138 --  K 4.1 3.9 --  CL 107 107 --  CO2 24 21 --  GLUCOSE 104* 96 --  BUN 13 13 --  CREATININE 0.98 0.90 --  CALCIUM 9.6 9.6 --  MG -- -- 2.1  PHOS -- -- --   CBC:  Basename 05/17/12 0455 05/16/12 0500 05/15/12 2015 05/15/12 1436  WBC 8.9 7.3 -- --  NEUTROABS -- -- 5.5 5.4  HGB 11.3* 11.4* -- --  HCT 33.7* 34.3* -- --  MCV 90.1 89.1 -- --  PLT 175 174 -- --   Cardiac Enzymes:  Basename 05/16/12 1115 05/16/12 0500 05/15/12 1930  CKTOTAL -- -- --  CKMB -- -- --  CKMBINDEX -- -- --  TROPONINI <0.30 <0.30 <0.30   Current Meds:    . aspirin EC  81 mg Oral Daily  . atorvastatin  40 mg Oral q1800  . azithromycin  500 mg Intravenous Q24H  . cefTAZidime (FORTAZ)  IV  1 g Intravenous Q8H  . furosemide  20 mg Intravenous Q12H  . irbesartan  75 mg Oral Daily  . nebivolol  10 mg Oral Daily  . sodium chloride  3 mL Intravenous Q12H   Active Problems:  MITRAL VALVE PROLAPSE  DIVERTICULOSIS, COLON  CARDIAC MURMUR  Hypertension  Anxiety  Atrial fibrillation  Pneumonia, organism unspecified   Echo 05/16/12:  Left  ventricle: The cavity size was mildly dilated. Wall thickness was normal. Systolic function was mildly to moderately reduced. The estimated ejection fraction was 40%, in the range of 40% to 45%. Diffuse hypokinesis. The study is not technically sufficient to allow evaluation of LV diastolic function. Doppler parameters are consistent with high ventricular filling pressure. - Mitral valve: Severe prolapse, involving the anterior leaflet and the posterior leaflet. Severe regurgitation. - Left atrium: The atrium was moderately dilated. - Right atrium: The atrium was mildly dilated. - Pericardium, extracardiac: A small pericardial effusion was identified. Impressions:  - Markedly abnormal MV with thickening, bileaflet prolapse and probable severe MR (difficult to quantitate due to chaotic nature of jet); suggest TEE to further assess.  ASSESSMENT AND PLAN:  1. Atrial fibrillation: New diagnosis. Rate controlled on IV cardizem. Will change to po Cardizem today. She is being anti-coagulated with heparin IV. Will plan TEE/DCCV later this week.   2. Pleural effusion/pneumonia: Appreciate pulmonary consultation. Re-evaluation of pleural effusion today with consideration for thoracentesis.   3. Severe MR: Will need TEE to further evaluate. This will be arranged later this week.  4. LV systolic dysfunction with acute CHF: She has diuresed well with IV Lasix. Will likely switch to po Lasix later today.    [name] [name]  1/22/20148:19 AM

## 2012-05-17 NOTE — Progress Notes (Signed)
Patient seen, well known.  Process started with infiltrate pneumonia treated with antibiotics.  Complicated by atrial fibrillation rate improved with bystolic.  Thoracentesis today.  TEE/potential cardioversion anticipated.  Support given. Appreciate cardiology and pulmonary help.

## 2012-05-17 NOTE — Consult Note (Signed)
PULMONARY  / CRITICAL CARE MEDICINE  Name: [name] [name] MRN: [id number] DOB: [birth year]    LOS: 2  REFERRING MD :  Riley Kill  CHIEF COMPLAINT:  Rt sided as dz LINES / TUBES:  CULTURES:  ANTIBIOTICS: 1-17 levaquin po>>  SIGNIFICANT EVENTS:  1-20 admit for afib with rvr 1/21- consult PNA 1/22- neg balance  INTERVAL HISTORY: plan thora  VITAL SIGNS: Temp:  [97.5 F (36.4 C)-98.1 F (36.7 C)] 97.9 F (36.6 C) (01/22 1625) Pulse Rate:  [31-112] 98  (01/22 1625) Resp:  [16-23] 23  (01/22 1625) BP: (89-99)/(48-66) 89/48 mmHg (01/22 1625) SpO2:  [95 %-99 %] 97 % (01/22 1625) HEMODYNAMICS:   VENTILATOR SETTINGS:   INTAKE / OUTPUT: Intake/Output      01/21 0701 - 01/22 0700 01/22 0701 - 01/23 0700   P.O. 100 120   I.V. (mL/kg) 142 (2.3) 37 (0.6)   IV Piggyback 100 100   Total Intake(mL/kg) 342 (5.4) 257 (4.1)   Urine (mL/kg/hr) 1300 (0.9) 600 (1)   Stool 1 0   Total Output 1301 600   Net -959 -343        Urine Occurrence 3 x    Stool Occurrence 1 x 1 x     PHYSICAL EXAMINATION: General: WNWDWF NAD at rest, reports pain with inspiration Neuro:  Intact, nonfocal HEENT:  No LAN Cardiovascular:  hsir ir Lungs:  Rt base reduced Abdomen:  + bs Musculoskeletal: intact Skin:  warm   LABS: Cbc  Lab 05/17/12 0455 05/16/12 0500 05/15/12 2015  WBC 8.9 -- --  HGB 11.3* 11.4* 12.3  HCT 33.7* 34.3* 36.4  PLT 175 174 200    Chemistry   Lab 05/17/12 0455 05/16/12 0500 05/15/12 2015  NA 140 138 136  K 4.1 3.9 4.2  CL 107 107 103  CO2 24 21 21   BUN 13 13 15   CREATININE 0.98 0.90 0.84  CALCIUM 9.6 9.6 9.9  MG -- -- 2.1  PHOS -- -- --  GLUCOSE 104* 96 120*    Liver fxn  Lab 05/17/12 0455 05/15/12 2015 05/15/12 1436  AST 40* 98* 81*  ALT 84* 123* 127*  ALKPHOS 97 117 125*  BILITOT 0.5 0.5 0.6  PROT 5.9* 6.5 6.6  ALBUMIN 3.3* 3.6 3.7   coags  Lab 05/15/12 2015 05/15/12 1436  APTT 153* 36  INR 1.24 1.15   Sepsis markers No results found for  this basename: LATICACIDVEN:3,PROCALCITON:3 in the last 168 hours Cardiac markers  Lab 05/16/12 1115 05/16/12 0500 05/15/12 1930  CKTOTAL -- -- --  CKMB -- -- --  TROPONINI <0.30 <0.30 <0.30   BNP  Lab 05/15/12 1435  PROBNP 4622.0*   ABG No results found for this basename: PHART:3,PCO2ART:3,PO2ART:3,HCO3:3,TCO2:3 in the last 168 hours  CBG trend No results found for this basename: GLUCAP:5 in the last 168 hours  IMAGING: X-ray Chest Pa And Lateral  05/16/2012  *RADIOLOGY REPORT*  Clinical Data: Cough and shortness of breath.  CHEST - 2 VIEW  Comparison: Chest radiograph performed 05/15/2012  Findings: There is worsening right basilar airspace opacification, with a mildly increased small right pleural effusion.  A small left pleural effusion is again seen.  Findings raise concern for pneumonia.  No pneumothorax is identified.  Mild vascular congestion is seen; the appearance is less typical for edema.  The cardiomediastinal silhouette is mildly enlarged; calcification is noted in the aortic arch.  No acute osseous abnormalities are identified.  IMPRESSION:  1.  Worsening right basilar airspace opacification,  with a mildly increased small right pleural effusion.  Findings raise concern for pneumonia. 2.  Mild vascular congestion and mild cardiomegaly; the appearance remains less typical for edema, though it is a possibility.  Small left pleural effusion also seen.   Original Report Authenticated By: Tonia Ghent, M.D.     RUE:AVWU DIAGNOSES: Active Problems:  MITRAL VALVE PROLAPSE  DIVERTICULOSIS, COLON  CARDIAC MURMUR  Hypertension  Anxiety  Atrial fibrillation  Pneumonia, organism unspecified  Systolic CHF, acute  ASSESSMENT / PLAN:  PULMONARY  ASSESSMENT: Hypoxia with presumed pna rt base Most concern is effusion in setting new fib rvr and inherent diastolic dysfunction from this PLAN:   -O2 as needed -[place] c/w large effusion with continued symptoms and unilateral -will  plan thora now -would continue lasix, she tolerated this well -pcxr to follow -IS post thora -control rate  CARDIOVASCULAR  ASSESSMENT:  Afib HTN PLAN:  -per cards -TEE planned  RENAL Lab Results  Component Value Date   CREATININE 0.98 05/17/2012   CREATININE 0.90 05/16/2012   CREATININE 0.84 05/15/2012    ASSESSMENT:  Mild low K, likely effusion PLAN:   Lasix maintain,  Chem in am  Add mag, phos  GASTROINTESTINAL  ASSESSMENT:  No acute issue PLAN:   -diet on hold for procedure  HEMATOLOGIC  ASSESSMENT:  Anemia, afib rvr PLAN:  -on heparin for a fib, held for thora -inr at baseline wnl pre thora  INFECTIOUS  ASSESSMENT:   Presumed CAP PLAN:   -will tap today thora Likely will limit abx duration, assess post thora pcxr  ENDOCRINE  ASSESSMENT:  No acute issue PLAN:   cbg with am bmet   Global:  For thora, send analysis, cytology, pcxr to follow, continue lasix  Mcarthur Rossetti. Tyson Alias, MD, FACP Pgr: [phone] [facility]

## 2012-05-17 NOTE — Progress Notes (Signed)
ANTICOAGULATION CONSULT NOTE - Follow Up Consult  Pharmacy Consult for heparin Indication: atrial fibrillation  Labs:  Basename 05/17/12 0455 05/16/12 1115 05/16/12 0500 05/15/12 2015 05/15/12 1930 05/15/12 1436  HGB 11.3* -- 11.4* -- -- --  HCT 33.7* -- 34.3* 36.4 -- --  PLT 175 -- 174 200 -- --  APTT -- -- -- 153* -- 36  LABPROT -- -- -- 15.4* -- 14.5  INR -- -- -- 1.24 -- 1.15  HEPARINUNFRC 0.55 0.45 0.51 -- -- --  CREATININE 0.98 -- 0.90 0.84 -- --  CKTOTAL -- -- -- -- -- --  CKMB -- -- -- -- -- --  TROPONINI -- <0.30 <0.30 -- <0.30 --   Goal of therapy: Heparin level 0.3-0.7  Assessment 72yo female with afib to restart heparin (held for thoracentesis today; noted no thoracentesis today due to improvement in effusion.  Plan -Will restart heparin at the prior rate of 900 units/hr -Heparin level in 8 hrs and daily with CBC daily  Harland German, Pharm D 05/17/2012 5:30 PM

## 2012-05-17 NOTE — Progress Notes (Signed)
I did repeat US as her symptoms this afternoon where improved US reveals sig reduction in effusion Continue lasix We will re assess in am , may still ned thora restart thep  Mcarthur Rossetti. Tyson Alias, MD, FACP Pgr: [phone] [facility]

## 2012-05-17 NOTE — Progress Notes (Signed)
ANTICOAGULATION CONSULT NOTE - Follow Up Consult  Pharmacy Consult for heparin Indication: atrial fibrillation  Labs:  Basename 05/17/12 0455 05/16/12 1115 05/16/12 0500 05/15/12 2015 05/15/12 1930 05/15/12 1436  HGB 11.3* -- 11.4* -- -- --  HCT 33.7* -- 34.3* 36.4 -- --  PLT 175 -- 174 200 -- --  APTT -- -- -- 153* -- 36  LABPROT -- -- -- 15.4* -- 14.5  INR -- -- -- 1.24 -- 1.15  HEPARINUNFRC 0.55 0.45 0.51 -- -- --  CREATININE 0.98 -- 0.90 0.84 -- --  CKTOTAL -- -- -- -- -- --  CKMB -- -- -- -- -- --  TROPONINI -- <0.30 <0.30 -- <0.30 --   Goal of therapy: Heparin level 0.3-0.7  Assessment/Plan:  72yo female therapeutic on heparin (level 0.55) for Afib.  Will continue gtt at current rate. Daily heparin level and CBC while on heparin.

## 2012-05-18 ENCOUNTER — Telehealth: Payer: Self-pay | Admitting: *Deleted

## 2012-05-18 LAB — BASIC METABOLIC PANEL
BUN: 11 mg/dL (ref 6–23)
Chloride: 104 mEq/L (ref 96–112)
GFR calc Af Amer: 74 mL/min — ABNORMAL LOW (ref >90)
Potassium: 3.6 mEq/L (ref 3.5–5.1)

## 2012-05-18 LAB — CBC
HCT: 36.2 % (ref 36.0–46.0)
Hemoglobin: 12.4 g/dL (ref 12.0–15.0)
RDW: 14.2 % (ref 11.5–15.5)
WBC: 8.8 10*3/uL (ref 4.0–10.5)

## 2012-05-18 LAB — HEPARIN LEVEL (UNFRACTIONATED)
Heparin Unfractionated: 0.32 IU/mL (ref 0.30–0.70)
Heparin Unfractionated: 0.4 IU/mL (ref 0.30–0.70)

## 2012-05-18 LAB — MAGNESIUM: Magnesium: 1.9 mg/dL (ref 1.5–2.5)

## 2012-05-18 MED ORDER — SODIUM CHLORIDE 0.9 % IV SOLN
INTRAVENOUS | Status: DC
  Administered 2012-05-18: 10 mL/h via INTRAVENOUS
  Administered 2012-05-19: 500 mL via INTRAVENOUS

## 2012-05-18 MED ORDER — SACCHAROMYCES BOULARDII 250 MG PO CAPS
250.0000 mg | ORAL_CAPSULE | Freq: Two times a day (BID) | ORAL | Status: DC
  Administered 2012-05-18 – 2012-05-24 (×12): 250 mg via ORAL
  Filled 2012-05-18 (×16): qty 1

## 2012-05-18 MED ORDER — POTASSIUM CHLORIDE CRYS ER 20 MEQ PO TBCR
40.0000 meq | EXTENDED_RELEASE_TABLET | Freq: Every day | ORAL | Status: DC
  Administered 2012-05-18 – 2012-05-24 (×6): 40 meq via ORAL
  Filled 2012-05-18 (×7): qty 2

## 2012-05-18 MED ORDER — FUROSEMIDE 40 MG PO TABS
40.0000 mg | ORAL_TABLET | Freq: Every day | ORAL | Status: DC
  Administered 2012-05-18 – 2012-05-23 (×5): 40 mg via ORAL
  Filled 2012-05-18 (×7): qty 1

## 2012-05-18 NOTE — Progress Notes (Signed)
ANTICOAGULATION CONSULT NOTE - Follow Up Consult  Pharmacy Consult for heparin Indication: atrial fibrillation  Labs:  Basename 05/18/12 0500 05/17/12 0455 05/16/12 1115 05/16/12 0500 05/15/12 2015 05/15/12 1930 05/15/12 1436  HGB 12.4 11.3* -- -- -- -- --  HCT 36.2 33.7* -- 34.3* -- -- --  PLT 184 175 -- 174 -- -- --  APTT -- -- -- -- 153* -- 36  LABPROT -- -- -- -- 15.4* -- 14.5  INR -- -- -- -- 1.24 -- 1.15  HEPARINUNFRC 0.32 0.55 0.45 -- -- -- --  CREATININE -- 0.98 -- 0.90 0.84 -- --  CKTOTAL -- -- -- -- -- -- --  CKMB -- -- -- -- -- -- --  TROPONINI -- -- <0.30 <0.30 -- <0.30 --    Assessment/Plan:  72yo female therapeutic on heparin after resumed.  Will continue gtt at current rate and confirm stable with additional level.  Colleen Can PharmD BCPS 05/18/2012,8:11 AM

## 2012-05-18 NOTE — Progress Notes (Signed)
PULMONARY  / CRITICAL CARE MEDICINE  Name: [name] [name] MRN: [id number] DOB: 30-Jul-1940    LOS: 3  REFERRING MD :  Riley Kill  CHIEF COMPLAINT:  Rt sided as dz   Brief: 72 y/o wf, never smoker, seen in her PCP office 1-17 with 3 days of non productive cough, rt chest wall pain, orthopnea and dyspnea. CXR with rt lower asdz and given Levaquin. Subsequently developed afib with rvr and admitted and started on Cardizem drip for rate control. Pccm asked to consult for suspected pna.  Found to have significant R pleural effusion.     LINES / TUBES:  CULTURES:  ANTIBIOTICS: 1-17 levaquin po>>  SIGNIFICANT EVENTS:  1-20 admit for afib with rvr 1/21- consult PNA 1/22- neg balance, improved effusion on ultrasound 1/23 - breathing improved, net neg 3500  INTERVAL HISTORY: No acute events.    VITAL SIGNS: Temp:  [97.9 F (36.6 C)-98.4 F (36.9 C)] 98.4 F (36.9 C) (01/23 0800) Pulse Rate:  [92-112] 94  (01/23 0800) Resp:  [17-23] 18  (01/23 0800) BP: (89-106)/(48-78) 106/68 mmHg (01/23 0800) SpO2:  [94 %-97 %] 95 % (01/23 0800) Weight:  [135 lb 12.9 oz (61.6 kg)] 135 lb 12.9 oz (61.6 kg) (01/23 0400)  INTAKE / OUTPUT: Intake/Output      01/22 0701 - 01/23 0700 01/23 0701 - 01/24 0700   P.O. 120    I.V. (mL/kg) 104 (1.7) 38 (0.6)   IV Piggyback 502    Total Intake(mL/kg) 726 (11.8) 38 (0.6)   Urine (mL/kg/hr) 3400 (2.3)    Stool 0    Total Output 3400    Net -2674 +38        Stool Occurrence 1 x      PHYSICAL EXAMINATION: General: WNWDWF NAD at rest Neuro:  Intact, nonfocal HEENT:  No LAN Cardiovascular:  s1s2 irr irr Lungs:  Improved breath sounds bilaterally Abdomen:  + bs Musculoskeletal: intact Skin:  warm   LABS: Cbc  Lab 05/18/12 0500 05/17/12 0455 05/16/12 0500  WBC 8.8 -- --  HGB 12.4 11.3* 11.4*  HCT 36.2 33.7* 34.3*  PLT 184 175 174   Chemistry  Lab 05/18/12 0500 05/17/12 0455 05/16/12 0500 05/15/12 2015  NA 140 140 138 --  K 3.6 4.1 3.9  --  CL 104 107 107 --  CO2 26 24 21  --  BUN 11 13 13  --  CREATININE 0.89 0.98 0.90 --  CALCIUM 10.3 9.6 9.6 --  MG 1.9 -- -- 2.1  PHOS 3.1 -- -- --  GLUCOSE 104* 104* 96 --   Liver fxn  Lab 05/17/12 0455 05/15/12 2015 05/15/12 1436  AST 40* 98* 81*  ALT 84* 123* 127*  ALKPHOS 97 117 125*  BILITOT 0.5 0.5 0.6  PROT 5.9* 6.5 6.6  ALBUMIN 3.3* 3.6 3.7   coags  Lab 05/15/12 2015 05/15/12 1436  APTT 153* 36  INR 1.24 1.15    Lab 05/16/12 1115 05/16/12 0500 05/15/12 1930  CKTOTAL -- -- --  CKMB -- -- --  TROPONINI <0.30 <0.30 <0.30   BNP  Lab 05/15/12 1435  PROBNP 4622.0*    IMAGING: No results found.  EXB:MWUX  DIAGNOSES: Active Problems:  MITRAL VALVE PROLAPSE  DIVERTICULOSIS, COLON  CARDIAC MURMUR  Hypertension  Anxiety  Atrial fibrillation  Pneumonia, organism unspecified  Systolic CHF, acute  ASSESSMENT / PLAN:  PULMONARY  ASSESSMENT: Hypoxia with presumed pna rt base Pleural effusion in setting new fib rvr and inherent diastolic dysfunction   PLAN:   -  O2 as needed -continue lasix, she tolerated this well -hold thora as she has improved with diuresis and rate control -IS -control rate  CARDIOVASCULAR  ASSESSMENT:  Afib HTN  PLAN:  -per cards -TEE planned  RENAL  ASSESSMENT:  Mild low K, likely effusion  PLAN:   -Lasix  40 daily with KCL -Chem in am, mag, phos  GASTROINTESTINAL  ASSESSMENT:  Hx Diverticulosis  PLAN:   -diet  -add pro biotic  HEMATOLOGIC  ASSESSMENT:   Anemia, afib rvr  PLAN:  -on heparin for a fib   INFECTIOUS  ASSESSMENT:   Presumed CAP  PLAN:   -Likely will limit abx duration -reassess pcxr in am 1/24,  Consider d/c abx  ENDOCRINE  ASSESSMENT:  No acute issue PLAN:   cbg with am bmet   Canary Brim, NP-C [facility] Pgr: [phone] or [phone]   I have interviewed and examined the patient and reviewed the database. I have formulated the assessment and plan  as reflected in the note above with amendments made by me.   Billy Fischer, MD;  PCCM service; Mobile [phone]

## 2012-05-18 NOTE — Telephone Encounter (Signed)
Received direct call from pt who states she is in the hospital and has questions regarding orders for procedure tomorrow.  I asked her to discuss this with her nurse in the hospital and have them contact the PA in the hospital if additional questions

## 2012-05-18 NOTE — Progress Notes (Signed)
    SUBJECTIVE: Feels weak. No SOB or chest pain.   BP 106/68  Pulse 92  Temp 98.1 F (36.7 C) (Oral)  Resp 20  Ht 5\' 7"  (1.702 m)  Wt 135 lb 12.9 oz (61.6 kg)  BMI 21.27 kg/m2  SpO2 97%  Intake/Output Summary (Last 24 hours) at 05/18/12 1308 Last data filed at 05/18/12 0645  Gross per 24 hour  Intake    707 ml  Output   3400 ml  Net  -2693 ml    PHYSICAL EXAM General: Well developed, well nourished, in no acute distress. Alert and oriented x 3.  Psych:  Good affect, responds appropriately Neck: No JVD. No masses noted.  Lungs: Clear bilaterally with no wheezes or rhonci noted.  Heart: Irreg irreg with systolic murmur noted. Abdomen: Bowel sounds are present. Soft, non-tender.  Extremities: No lower extremity edema.   LABS: Basic Metabolic Panel:  Basename 05/17/12 0455 05/16/12 0500 05/15/12 2015  NA 140 138 --  K 4.1 3.9 --  CL 107 107 --  CO2 24 21 --  GLUCOSE 104* 96 --  BUN 13 13 --  CREATININE 0.98 0.90 --  CALCIUM 9.6 9.6 --  MG -- -- 2.1  PHOS -- -- --   CBC:  Basename 05/17/12 0455 05/16/12 0500 05/15/12 2015 05/15/12 1436  WBC 8.9 7.3 -- --  NEUTROABS -- -- 5.5 5.4  HGB 11.3* 11.4* -- --  HCT 33.7* 34.3* -- --  MCV 90.1 89.1 -- --  PLT 175 174 -- --   Cardiac Enzymes:  Basename 05/16/12 1115 05/16/12 0500 05/15/12 1930  CKTOTAL -- -- --  CKMB -- -- --  CKMBINDEX -- -- --  TROPONINI <0.30 <0.30 <0.30   Current Meds:    . aspirin EC  81 mg Oral Daily  . atorvastatin  40 mg Oral q1800  . azithromycin  500 mg Intravenous Q24H  . cefTAZidime (FORTAZ)  IV  1 g Intravenous Q8H  . diltiazem  120 mg Oral Daily  . furosemide  20 mg Intravenous Q12H  . irbesartan  75 mg Oral Daily  . nebivolol  10 mg Oral Daily  . sodium chloride  3 mL Intravenous Q12H     ASSESSMENT AND PLAN:  1. Atrial fibrillation: New diagnosis. Rate controlled on po cardizem and po beta blocker.  She is being anti-coagulated with heparin IV. Will plan TEE/DCCV  tomorrow. She will need long term anti-coagulation. Will likely use one of the newer agents. This has not been started yet given the possibility of need for thoracentesis.   2. Pleural effusion/pneumonia: Appreciate pulmonary consultation. Effusion smaller yesterday by u/s. Re-evaluation of pleural effusion today with consideration for thoracentesis. Antibiotics per Pulmonary.   3. Severe MR: Will need TEE to further evaluate. This will be arranged for tomorrow since she has eaten this am.    4. LV systolic dysfunction with acute CHF: She has diuresed well with IV Lasix. Will switch to po Lasix later today.  5. Dispo: Transfer to telemetry unit today.  NPO at midnight tonight for TEE/DCCV tomorrow.     MCALHANY,CHRISTOPHER  1/23/20147:22 AM

## 2012-05-18 NOTE — Progress Notes (Signed)
ANTICOAGULATION CONSULT NOTE - Follow Up Consult  Pharmacy Consult for heparin Indication: atrial fibrillation  Labs:  Basename 05/18/12 1134 05/18/12 0500 05/17/12 0455 05/16/12 1115 05/16/12 0500 05/15/12 2015 05/15/12 1930 05/15/12 1436  HGB -- 12.4 11.3* -- -- -- -- --  HCT -- 36.2 33.7* -- 34.3* -- -- --  PLT -- 184 175 -- 174 -- -- --  APTT -- -- -- -- -- 153* -- 36  LABPROT -- -- -- -- -- 15.4* -- 14.5  INR -- -- -- -- -- 1.24 -- 1.15  HEPARINUNFRC 0.40 0.32 0.55 -- -- -- -- --  CREATININE -- 0.89 0.98 -- 0.90 -- -- --  CKTOTAL -- -- -- -- -- -- -- --  CKMB -- -- -- -- -- -- -- --  TROPONINI -- -- -- <0.30 <0.30 -- <0.30 --    Assessment/Plan:  72yo female with afib therapeutic on heparin after resumed s/p thoracentesis. H/H and Plts wnl, no bleeding reported. Continue heparin drip at 900 units/hr and f/u AM HL.   Thank you,  Brett Fairy, PharmD, BCPS 05/18/2012 12:50 PM

## 2012-05-19 ENCOUNTER — Encounter (HOSPITAL_COMMUNITY): Payer: Self-pay | Admitting: Anesthesiology

## 2012-05-19 ENCOUNTER — Other Ambulatory Visit: Payer: Self-pay | Admitting: *Deleted

## 2012-05-19 ENCOUNTER — Encounter (HOSPITAL_COMMUNITY)
Admission: EM | Disposition: A | Discharge status: Home / Self Care | Payer: Self-pay | Source: Home / Self Care | Attending: Cardiology

## 2012-05-19 ENCOUNTER — Inpatient Hospital Stay (HOSPITAL_COMMUNITY): Payer: Medicare Other

## 2012-05-19 ENCOUNTER — Encounter (HOSPITAL_COMMUNITY): Payer: Self-pay | Admitting: Gastroenterology

## 2012-05-19 DIAGNOSIS — I5021 Acute systolic (congestive) heart failure: Secondary | ICD-10-CM | POA: Diagnosis present

## 2012-05-19 DIAGNOSIS — I350 Nonrheumatic aortic (valve) stenosis: Secondary | ICD-10-CM

## 2012-05-19 DIAGNOSIS — I059 Rheumatic mitral valve disease, unspecified: Secondary | ICD-10-CM

## 2012-05-19 DIAGNOSIS — Z531 Procedure and treatment not carried out because of patient's decision for reasons of belief and group pressure: Secondary | ICD-10-CM

## 2012-05-19 DIAGNOSIS — Z01810 Encounter for preprocedural cardiovascular examination: Secondary | ICD-10-CM

## 2012-05-19 DIAGNOSIS — I4891 Unspecified atrial fibrillation: Secondary | ICD-10-CM

## 2012-05-19 DIAGNOSIS — J9600 Acute respiratory failure, unspecified whether with hypoxia or hypercapnia: Secondary | ICD-10-CM

## 2012-05-19 DIAGNOSIS — J9601 Acute respiratory failure with hypoxia: Secondary | ICD-10-CM | POA: Diagnosis present

## 2012-05-19 DIAGNOSIS — I340 Nonrheumatic mitral (valve) insufficiency: Secondary | ICD-10-CM | POA: Diagnosis present

## 2012-05-19 HISTORY — PX: TEE WITHOUT CARDIOVERSION: SHX5443

## 2012-05-19 HISTORY — DX: Nonrheumatic aortic (valve) stenosis: I35.0

## 2012-05-19 LAB — CBC
MCH: 29.9 pg (ref 26.0–34.0)
MCHC: 33.6 g/dL (ref 30.0–36.0)
Platelets: 181 10*3/uL (ref 150–400)
RDW: 14 % (ref 11.5–15.5)

## 2012-05-19 LAB — BASIC METABOLIC PANEL
Calcium: 10.1 mg/dL (ref 8.4–10.5)
GFR calc non Af Amer: 58 mL/min — ABNORMAL LOW (ref >90)
Sodium: 137 mEq/L (ref 135–145)

## 2012-05-19 SURGERY — ECHOCARDIOGRAM, TRANSESOPHAGEAL
Anesthesia: Monitor Anesthesia Care

## 2012-05-19 MED ORDER — LIDOCAINE VISCOUS 2 % MT SOLN
OROMUCOSAL | Status: AC
  Filled 2012-05-19: qty 15

## 2012-05-19 MED ORDER — ENOXAPARIN SODIUM 60 MG/0.6ML ~~LOC~~ SOLN
60.0000 mg | Freq: Two times a day (BID) | SUBCUTANEOUS | Status: DC
  Administered 2012-05-19 – 2012-05-21 (×5): 60 mg via SUBCUTANEOUS
  Filled 2012-05-19 (×8): qty 0.6

## 2012-05-19 MED ORDER — SODIUM CHLORIDE 0.9 % IV SOLN: INTRAVENOUS | Status: DC

## 2012-05-19 MED ORDER — FENTANYL CITRATE 0.05 MG/ML IJ SOLN
INTRAMUSCULAR | Status: AC
  Filled 2012-05-19: qty 2

## 2012-05-19 MED ORDER — FENTANYL CITRATE 0.05 MG/ML IJ SOLN
INTRAMUSCULAR | Status: DC | PRN
  Administered 2012-05-19 (×3): 25 ug via INTRAVENOUS

## 2012-05-19 MED ORDER — MIDAZOLAM HCL 10 MG/2ML IJ SOLN
INTRAMUSCULAR | Status: DC | PRN
  Administered 2012-05-19: 2 mg via INTRAVENOUS
  Administered 2012-05-19 (×3): 1 mg via INTRAVENOUS

## 2012-05-19 MED ORDER — LIDOCAINE VISCOUS 2 % MT SOLN
OROMUCOSAL | Status: DC | PRN
  Administered 2012-05-19: 1 via OROMUCOSAL

## 2012-05-19 MED ORDER — MIDAZOLAM HCL 5 MG/ML IJ SOLN
INTRAMUSCULAR | Status: AC
  Filled 2012-05-19: qty 2

## 2012-05-19 MED ORDER — AZITHROMYCIN 500 MG PO TABS
500.0000 mg | ORAL_TABLET | Freq: Every day | ORAL | Status: DC
  Administered 2012-05-19 – 2012-05-23 (×5): 500 mg via ORAL
  Filled 2012-05-19 (×6): qty 1

## 2012-05-19 NOTE — Anesthesia Preprocedure Evaluation (Addendum)
Anesthesia Evaluation    Reviewed: Allergy & Precautions, H&P , NPO status , Patient's Chart, lab work & pertinent test results  History of Anesthesia Complications (+) PONV  Airway       Dental   Pulmonary  CXR (1/23) Probable basilar atelectasis and effusions right greater than left.          Cardiovascular hypertension, Pt. on medications +CHF + dysrhythmias Atrial Fibrillation + Valvular Problems/Murmurs AS  LVEF 40% (05/16/12)   Neuro/Psych Anxiety    GI/Hepatic GERD-  Medicated,  Endo/Other    Renal/GU      Musculoskeletal   Abdominal   Peds  Hematology   Anesthesia Other Findings   Reproductive/Obstetrics                        Anesthesia Physical Anesthesia Plan  ASA:   Anesthesia Plan: MAC   Post-op Pain Management:    Induction: Intravenous  Airway Management Planned: Simple Face Mask  Additional Equipment:   Intra-op Plan:   Post-operative Plan:   Informed Consent:   Plan Discussed with: Anesthesiologist and Surgeon  Anesthesia Plan Comments:         Anesthesia Quick Evaluation

## 2012-05-19 NOTE — Progress Notes (Signed)
Utilization Review Completed.   Jewels Langone, RN, BSN Nurse Case Manager  [phone]  

## 2012-05-19 NOTE — Progress Notes (Addendum)
ANTICOAGULATION & ANTIBIOTIC CONSULT NOTE - Follow Up Consult  Pharmacy Consult for Heparin & Ceftazidime Indication: atrial fibrillation & presumed pneumonia  Assessment: 72yo female being continued on IV heparin for atrial fibrillation. TEE done today but cardioversion cancelled due to size of LA and will need cath on Monday and possible MV surgery. Heparin level therapeutic this morning at 0.41 units/ml. CBC stable & per RN, no bleeding noted and no issues with line.  Patient is continuing on ceftazidime (per RX) & also azithromycin (day 4) for presumed pneumonia. WBC 7.9, Afebrile, Scr 0.97. Tolerating PO medications, so will change azithromycin to PO.  Antibiotics: Ceftazidime 1/21 >> Azithromycin 1/21 >> Levofloxacin 1/20-1/21  Goal of Therapy:  Heparin level 0.3-0.7 units/ml Monitor platelets by anticoagulation protocol: Yes   Plan:  1) Continue IV heparin at current rate of 900 units/hr 2) F/u daily heparin level & CBC 3) Continue ceftazidime 1g Q8 hours 4) Monitor renal function & adjust abx dose if necessary, duration of therapy, clinical status 5) Change azithromycin to PO   Benjaman Pott, PharmD    05/19/2012   2:48 PM   -----------  Patient Measurements: Height: 5\' 7"  (170.2 cm) Weight: 130 lb 8.2 oz (59.2 kg) IBW/kg (Calculated) : 61.6   Vital Signs: Temp: 98.6 F (37 C) (01/24 1356) Temp src: Oral (01/24 1356) BP: 108/67 mmHg (01/24 1356) Pulse Rate: 100  (01/24 1356)  Labs:  Basename 05/19/12 0605 05/18/12 1134 05/18/12 0500 05/17/12 0455  HGB 12.3 -- 12.4 --  HCT 36.6 -- 36.2 33.7*  PLT 181 -- 184 175  APTT -- -- -- --  LABPROT -- -- -- --  INR -- -- -- --  HEPARINUNFRC 0.41 0.40 0.32 --  CREATININE 0.97 -- 0.89 0.98  CKTOTAL -- -- -- --  CKMB -- -- -- --  TROPONINI -- -- -- --    Estimated Creatinine Clearance: 50.4 ml/min (by C-G formula based on Cr of 0.97).

## 2012-05-19 NOTE — Progress Notes (Addendum)
*  PRELIMINARY RESULTS* Vascular Ultrasound Pre-op Cardiac Surgery  Carotid Findings: There is no obvious evidence of hemodynamically significant internal carotid artery stenosis >40%. Vertebral arteries are patent with antegrade flow.   Upper Extremity Right Left  Brachial Pressures 98-Triphasic 89-Triphasic  Radial Waveforms Biphasic Biphasic  Ulnar Waveforms Biphasic Biphasic  Palmar Arch (Allen's Test) Signal obliterates with radial compression, is unaffected with ulnar compression. Signal obliterates with both radial and ulnar compression.    Lower  Extremity Right Left  Dorsalis Pedis    Anterior Tibial    Posterior Tibial    Ankle/Brachial Indices      Findings:   Bilateral palpable pedal pulses.  05/19/2012 5:28 PM Gertie Fey, RDMS, RDCS

## 2012-05-19 NOTE — Progress Notes (Signed)
SUBJ: Events noted. No new complaints. No distress  OBJ:  Filed Vitals:   05/19/12 1356  BP: 108/67  Pulse: 100  Temp: 98.6 F (37 C)  Resp: 18   NAD No JVD noted No wheeze Minimal bibasilar rales IRIR NABS No edema   BMET    Component Value Date/Time   NA 137 05/19/2012 0605   K 4.0 05/19/2012 0605   CL 103 05/19/2012 0605   CO2 25 05/19/2012 0605   GLUCOSE 104* 05/19/2012 0605   BUN 12 05/19/2012 0605   CREATININE 0.97 05/19/2012 0605   CALCIUM 10.1 05/19/2012 0605   GFRNONAA 58* 05/19/2012 0605   GFRAA 67* 05/19/2012 0605    CBC    Component Value Date/Time   WBC 7.9 05/19/2012 0605   RBC 4.11 05/19/2012 0605   HGB 12.3 05/19/2012 0605   HCT 36.6 05/19/2012 0605   PLT 181 05/19/2012 0605   MCV 89.1 05/19/2012 0605   MCH 29.9 05/19/2012 0605   MCHC 33.6 05/19/2012 0605   RDW 14.0 05/19/2012 0605   LYMPHSABS 2.8 05/15/2012 2015   MONOABS 0.7 05/15/2012 2015   EOSABS 0.1 05/15/2012 2015   BASOSABS 0.0 05/15/2012 2015    CXR:  IMPR: Hypoxic respiratory failure - likely all explained by CHF, pulmonary edema  Doubt PNA Small bilateral effusions - again likely due to  CHF Atrial fibrillation  PLAN/REC: No indication for thoracentesis D/C abx Mgmt of AF and CHF per Cards  PCCM will sign off. Please call if we can be of further assistance   Billy Fischer, MD ; [facility] [phone].  After 5:30 PM or weekends, call [phone]

## 2012-05-19 NOTE — H&P (View-Only) (Signed)
    SUBJECTIVE: Breathing is better. NO chest pain.   BP 101/68  Pulse 109  Temp 98.1 F (36.7 C) (Oral)  Resp 20  Ht 5\' 7"  (1.702 m)  Wt 130 lb 8.2 oz (59.2 kg)  BMI 20.44 kg/m2  SpO2 98%  Intake/Output Summary (Last 24 hours) at 05/19/12 0724 Last data filed at 05/19/12 0400  Gross per 24 hour  Intake 1347.33 ml  Output   1050 ml  Net 297.33 ml    PHYSICAL EXAM General: Well developed, well nourished, in no acute distress. Alert and oriented x 3.  Psych:  Good affect, responds appropriately Neck: No JVD. No masses noted.  Lungs: Clear bilaterally with no wheezes or rhonci noted.  Heart: Irreg irreg with systolic murmur Abdomen: Bowel sounds are present. Soft, non-tender.  Extremities: No lower extremity edema.   LABS: Basic Metabolic Panel:  Basename 05/19/12 0605 05/18/12 0500  NA 137 140  K 4.0 3.6  CL 103 104  CO2 25 26  GLUCOSE 104* 104*  BUN 12 11  CREATININE 0.97 0.89  CALCIUM 10.1 10.3  MG -- 1.9  PHOS -- 3.1   CBC:  Basename 05/19/12 0605 05/18/12 0500  WBC 7.9 8.8  NEUTROABS -- --  HGB 12.3 12.4  HCT 36.6 36.2  MCV 89.1 89.2  PLT 181 184   Cardiac Enzymes:  Basename 05/16/12 1115  CKTOTAL --  CKMB --  CKMBINDEX --  TROPONINI <0.30   Current Meds:    . aspirin EC  81 mg Oral Daily  . atorvastatin  40 mg Oral q1800  . azithromycin  500 mg Intravenous Q24H  . cefTAZidime (FORTAZ)  IV  1 g Intravenous Q8H  . diltiazem  120 mg Oral Daily  . furosemide  40 mg Oral Daily  . nebivolol  10 mg Oral Daily  . potassium chloride  40 mEq Oral Daily  . saccharomyces boulardii  250 mg Oral BID  . sodium chloride  3 mL Intravenous Q12H     ASSESSMENT AND PLAN:  1. Atrial fibrillation: New diagnosis. Rate controlled on po cardizem and po beta blocker. She is being anti-coagulated with heparin IV. Will plan TEE/DCCV today. She will need long term anti-coagulation. Will likely use one of the newer agents. This has not been started yet given the  possibility of need for thoracentesis over the last few days. If her mitral regurgitation remains severe on TEE today, she will likely need a cardiac cath Monday to define possible CAD so would continue heparin over weekend.   2. Pleural effusion/pneumonia: Appreciate pulmonary consultation. Effusion smaller after diuresis so will not likely need thoracentesis. Antibiotics per Pulmonary.   3. Severe MR: Will need TEE to further evaluate. This will be arranged for today. If MR is severe in setting of reduced LV function, will need cardiac cath next week and surgical consultation.   4. LV systolic dysfunction with acute CHF: Appears to be euvolemic. Continue po Lasix.    5. Dispo: NPO TEE/DCCV today.      MCALHANY,CHRISTOPHER  1/24/20147:24 AM

## 2012-05-19 NOTE — Progress Notes (Signed)
ANTICOAGULATION CONSULT NOTE - Follow Up Consult  Pharmacy Consult for Lovenox Indication: atrial fibrillation  Allergies  Allergen Reactions  . Sulfa Drugs Cross Reactors Other (See Comments)    Unknown.  . Aspirin Rash  . Naproxen Sodium Swelling and Rash   Patient Measurements: Height: 5\' 7"  (170.2 cm) Weight: 130 lb 8.2 oz (59.2 kg) IBW/kg (Calculated) : 61.6   Vital Signs: Temp: 98 F (36.7 C) (01/24 1745) Temp src: Oral (01/24 1745) BP: 97/50 mmHg (01/24 1745) Pulse Rate: 108  (01/24 1745)  Labs:  Basename 05/19/12 0605 05/18/12 1134 05/18/12 0500 05/17/12 0455  HGB 12.3 -- 12.4 --  HCT 36.6 -- 36.2 33.7*  PLT 181 -- 184 175  APTT -- -- -- --  LABPROT -- -- -- --  INR -- -- -- --  HEPARINUNFRC 0.41 0.40 0.32 --  CREATININE 0.97 -- 0.89 0.98  CKTOTAL -- -- -- --  CKMB -- -- -- --  TROPONINI -- -- -- --    Estimated Creatinine Clearance: 50.4 ml/min (by C-G formula based on Cr of 0.97).  Assessment: 72 y/o female known to pharmacy from heparin dosing. Pharmacy consulted to switch heparin to Lovenox to minimize blood loss from blood draws. Patient also being considered for MV surgery and has a planned cath on Mon. She prefers no blood products for religious beliefs. CBC and renal function are wnl.  Spoke with RN who will turn heparin off before 20:00.  Goal of Therapy:  Anti-Xa level 0.6-1.2 units/ml 4hrs after LMWH dose given Monitor platelets by anticoagulation protocol: Yes   Plan:  -Heparin to be turned off by 20:00 -Lovenox 60 mg SQ q12h - first dose at 21:00 -CBC q72h while on Lovenox  [facility], [place].D., BCPS Clinical Pharmacist Pager: [phone] 05/19/2012 7:46 PM

## 2012-05-19 NOTE — CV Procedure (Signed)
    TRANSESOPHAGEAL ECHOCARDIOGRAM   NAME:  [name] [name]   MRN: [id number] DOB:  11/17/1940   ADMIT DATE: 05/15/2012  INDICATIONS: Symptomatic AF   PROCEDURE:   Informed consent was obtained prior to the procedure. The risks, benefits and alternatives for the procedure were discussed and the patient comprehended these risks.  Risks include, but are not limited to, cough, sore throat, vomiting, nausea, somnolence, esophageal and stomach trauma or perforation, bleeding, low blood pressure, aspiration, pneumonia, infection, trauma to the teeth and death.    After a procedural time-out, the patient was given 5 mg versed and 75 mcg fentanyl for moderate sedation.  The oropharynx was anesthetized 10 cc of topical 1% viscous lidocaine.  The transesophageal probe was inserted in the esophagus and stomach without difficulty and multiple views were obtained.    FINDINGS:  LEFT VENTRICLE: EF = 45%. Global HK  RIGHT VENTRICLE: Mildly hypokinetic  LEFT ATRIUM: Severely enlarged  LEFT ATRIAL APPENDAGE: No clot  RIGHT ATRIUM: Mildly dialted  AORTIC VALVE:  Trileaflet. Mildly calcified. No AI/AS   MITRAL VALVE:    The posterior leaflet is markedly abnormal. It is very thickened with severe prolapse particularly in the P2 region. There may be a cleft or perforation in that region as well. There is severe MR with 2 eccentric jets.    TRICUSPID VALVE: Normal. Mild Tr  PULMONIC VALVE: Not visualized  INTERATRIAL SEPTUM: No PFO or ASD  PERICARDIUM: No effusion  DESCENDING AORTA: Moderate to severe plaque.   Cardioversion not performed due to size of LA and need for cath and probably MV surgery.   Daniel Bensimhon,MD 10:09 AM

## 2012-05-19 NOTE — Preoperative (Signed)
Beta Blockers   Reason not to administer Beta Blockers:Not Applicable 

## 2012-05-19 NOTE — Progress Notes (Signed)
Echocardiogram Echocardiogram Transesophageal has been performed.  [name] [name] 05/19/2012, 10:15 AM

## 2012-05-19 NOTE — Consult Note (Signed)
CARDIOTHORACIC SURGERY CONSULTATION REPORT  PCP is ARONSON,RICHARD A, MD Referring Provider is Kathleene Hazel, MD   Reason for consultation:  Severe mitral regurgitation  HPI:  Patient is a 72 year old female from [place] with known history of mitral valve prolapse and mitral regurgitation who has been followed over the years by several different cardiologists, most recently by Dr Clifton James. She has known about mitral valve prolapse and mitral regurgitation for at least 7 or 8 years. She describes a long history of mild exertional shortness of breath, but she states that her breathing seemed to improve when she became more physically active. Up until recently she has been exercising on a fairly regular basis with only mild symptoms of intermittent exertional shortness of breath. Approximately 4 weeks ago she began to experience worsening exertional shortness of breath, tachypalpitations exacerbated with exercise, and a persistent dry nonproductive cough. These symptoms continued to worsen over several weeks.  She was seen as an outpatient by her primary care physician and a chest x-ray was performed demonstrating right lower lung opacity suspicious for pneumonia. She was treated with antibiotics.She ultimately presented to the [facility] cardiology office on January 20 where she was seen by Norma Fredrickson and Dr. Riley Kill.  She was noted to be in rapid atrial fibrillation with congestive heart failure and she was subsequently admitted to the hospital. Chest x-ray confirmed the presence of a right pleural effusion.  Atrial fibrillation is pain-free with rate control and anticoagulation, and congestive heart failure has been treated with diuresis. The patient has enjoyed some symptomatic improvement but she remains short of breath at rest. Transesophageal echocardiogram performed earlier today demonstrates mitral valve prolapse with severe mitral regurgitation moderate left ventricular  dysfunction. Cardiothoracic surgical consultation has been requested. Cardiac catheterization has been scheduled for Monday.  The patient describes a long history of mild exertional shortness of breath which has gotten acutely worse over the last several weeks. The patient now has resting shortness breath, PND, and orthopnea. She has had frequent tachypalpitations and is in atrial fibrillation. She has had dizzy spells without syncope. She has had some tightness across her chest which is not related to physical exertion.   The patient is a somewhat difficult historian.  She is a member of the [employer] faith. She is a bit confused about her specific beliefs regarding blood conservation but she does not feel that she would want a blood transfusion.  Past Medical History  Diagnosis Date  . Hypertension   . Diverticulitis   . Anxiety   . Heart murmur     MVP  . Hyperlipidemia   . Goiter   . GERD (gastroesophageal reflux disease)   . Aortic stenosis 05/19/2012    trivial by TEE  . PONV (postoperative nausea and vomiting)   . Dysrhythmia     afib  . Atrial fibrillation 05/16/2012  . Mitral regurgitation 05/19/2012    Severe by TEE  . Acute on chronic diastolic CHF (congestive heart failure), NYHA class 4 05/19/2012  . Refusal of blood transfusions as patient is [employer] Witness 05/19/2012    Past Surgical History  Procedure Date  . Uterine fibroid surgery 1988  . Ovarian cyst surgery 1988  . Tubal ligation 1974    Family History  Problem Relation Age of Onset  . Colon cancer Father     History   Social History  . Marital Status: Divorced    Spouse Name: N/A    Number of  Children: N/A  . Years of Education: N/A   Occupational History  . Not on file.   Social History Main Topics  . Smoking status: Never Smoker   . Smokeless tobacco: Never Used  . Alcohol Use: No     Comment: 1 drink a month  . Drug Use: No  . Sexually Active: Not on file   Other Topics Concern    . Not on file   Social History Narrative   Divorced - lives alone - remains functionally independent    Prior to Admission medications   Medication Sig Start Date End Date Taking? Authorizing Provider  acetaminophen (TYLENOL) 500 MG tablet Take 500 mg by mouth every 6 (six) hours as needed. For pain   Yes Historical Provider, MD  ALPRAZolam (XANAX) 0.25 MG tablet Take 0.25 mg by mouth 3 (three) times daily as needed. For anxiety   Yes Historical Provider, MD  aspirin 81 MG tablet Take 81 mg by mouth daily.   Yes Historical Provider, MD  irbesartan (AVAPRO) 75 MG tablet Take 75 mg by mouth daily.   Yes Historical Provider, MD  levofloxacin (LEVAQUIN) 500 MG tablet Take 500 mg by mouth daily. For 7 days. Started 05/12/12   Yes Historical Provider, MD  nebivolol (BYSTOLIC) 10 MG tablet Take 10 mg by mouth daily.   Yes Historical Provider, MD  Probiotic Product (PROBIOTIC DAILY PO) Take 1 capsule by mouth daily.   Yes Historical Provider, MD  rosuvastatin (CRESTOR) 20 MG tablet Take 20 mg by mouth daily.   Yes Historical Provider, MD    Current Facility-Administered Medications  Medication Dose Route Frequency Provider Last Rate Last Dose  . 0.9 %  sodium chloride infusion  250 mL Intravenous PRN Ok Anis, NP      . 0.9 %  sodium chloride infusion   Intravenous Continuous Joline Salt Barrett, PA 20 mL/hr at 05/19/12 0843 500 mL at 05/19/12 0843  . acetaminophen (TYLENOL) tablet 650 mg  650 mg Oral Q4H PRN Ok Anis, NP   650 mg at 05/19/12 1350  . ALPRAZolam Prudy Feeler) tablet 0.25 mg  0.25 mg Oral TID PRN Ok Anis, NP   0.25 mg at 05/18/12 2240  . aspirin EC tablet 81 mg  81 mg Oral Daily Brett Fairy, PHARMD   81 mg at 05/18/12 0946  . atorvastatin (LIPITOR) tablet 40 mg  40 mg Oral q1800 Ok Anis, NP   40 mg at 05/19/12 1743  . azithromycin (ZITHROMAX) tablet 500 mg  500 mg Oral Daily Benjaman Pott, PHARMD   500 mg at 05/19/12 1743  . dextrose 5 %-0.45 %  sodium chloride infusion   Intravenous Continuous Rosalio Macadamia, NP   500 mL at 05/15/12 2000  . diltiazem (CARDIZEM CD) 24 hr capsule 120 mg  120 mg Oral Daily Kathleene Hazel, MD   120 mg at 05/18/12 0946  . furosemide (LASIX) tablet 40 mg  40 mg Oral Daily Kathleene Hazel, MD   40 mg at 05/18/12 0946  . heparin ADULT infusion 100 units/mL (25000 units/250 mL)  900 Units/hr Intravenous Continuous Benny Lennert, PHARMD 9 mL/hr at 05/19/12 0644 900 Units/hr at 05/19/12 0644  . nebivolol (BYSTOLIC) tablet 10 mg  10 mg Oral Daily Ok Anis, NP   10 mg at 05/18/12 0947  . nitroGLYCERIN (NITROSTAT) SL tablet 0.4 mg  0.4 mg Sublingual Q5 Min x 3 PRN Ok Anis, NP      .  ondansetron (ZOFRAN) injection 4 mg  4 mg Intravenous Q6H PRN Rosalio Macadamia, NP   4 mg at 05/16/12 1843  . potassium chloride SA (K-DUR,KLOR-CON) CR tablet 40 mEq  40 mEq Oral Daily Jeanella Craze, NP   40 mEq at 05/18/12 1505  . saccharomyces boulardii (FLORASTOR) capsule 250 mg  250 mg Oral BID Jeanella Craze, NP   250 mg at 05/18/12 2242  . sodium chloride 0.9 % injection 3 mL  3 mL Intravenous Q12H Ok Anis, NP   3 mL at 05/18/12 2242  . sodium chloride 0.9 % injection 3 mL  3 mL Intravenous PRN Ok Anis, NP      . zolpidem (AMBIEN) tablet 5 mg  5 mg Oral QHS PRN Laurann Montana, PA   5 mg at 05/15/12 2252    Allergies  Allergen Reactions  . Sulfa Drugs Cross Reactors Other (See Comments)    Unknown.  . Aspirin Rash  . Naproxen Sodium Swelling and Rash      Review of Systems:   General:  normal appetite, decreased energy, no weight gain, no weight loss, no fever  Cardiac:  no chest pain with exertion, occasional chest pain at rest, + SOB with mild exertion, + resting SOB, + PND, + orthopnea, + palpitations, + arrhythmia, + atrial fibrillation, mild LE edema, + dizzy spells, no syncope  Respiratory:  + shortness of breath, no home oxygen, no productive cough, +  dry cough, no bronchitis, + wheezing, no hemoptysis, no asthma, no pain with inspiration or cough, no sleep apnea, no CPAP at night  GI:   no difficulty swallowing, no reflux, no frequent heartburn, no hiatal hernia, no abdominal pain, no constipation, no diarrhea, no hematochezia, no hematemesis, no melena  GU:   no dysuria,  no frequency, no urinary tract infection, no hematuria, no kidney stones, no kidney disease  Vascular:  no pain suggestive of claudication, no pain in feet, no leg cramps, no varicose veins, no DVT, no non-healing foot ulcer  Neuro:   no stroke, no TIA's, no seizures, occasional headaches, no temporary blindness one eye,  no slurred speech, no peripheral neuropathy, no chronic pain, no instability of gait, no memory/cognitive dysfunction  Musculoskeletal: no arthritis, no joint swelling, no myalgias, no difficulty walking, normal mobility   Skin:   no rash, no itching, no skin infections, no pressure sores or ulcerations  Psych:   + anxiety, no depression, + nervousness, no unusual recent stress  Eyes:   no blurry vision, no floaters, no recent vision changes, doesn't wears glasses or contacts  ENT:   no hearing loss, no loose or painful teeth, no dentures, last saw dentist w/in last year - gets routine dental prophylaxis  Hematologic:  no easy bruising, no abnormal bleeding, no clotting disorder, no frequent epistaxis  Endocrine:  no diabetes, does not check CBG's at home     Physical Exam:   BP 97/50  Pulse 108  Temp 98 F (36.7 C) (Oral)  Resp 18  Ht 5\' 7"  (1.702 m)  Wt 59.2 kg (130 lb 8.2 oz)  BMI 20.44 kg/m2  SpO2 97%  General:  thin  well-appearing  HEENT:  Unremarkable   Neck:   no JVD, no bruits, no adenopathy   Chest:   Diminished breath sounds right base, no wheezes, no rhonchi, + rales  CV:   Irregular rhythm w/ elevated rate, + III/VI systolic murmur   Abdomen:  soft, non-tender, no masses  Extremities:  warm, well-perfused, pulses palpable but  diminished in groins, trace LE edema  Rectal/GU  Deferred  Neuro:   Grossly non-focal and symmetrical throughout  Skin:   Clean and dry, no rashes, no breakdown  Diagnostic Tests:  Transthoracic Echocardiography  Patient: Lamiracle, Chaidez MR #: [id number] Study Date: 05/16/2012 Gender: F Age: 78 Height: 170.2cm Weight: 62.8kg BSA: 1.72m^2 Pt. Status: Room: 2601  Girtha Hake ATTENDING Olga Millers PERFORMING Corinda Gubler, [facility], Lawson Fiscal SONOGRAPHER Melissa Morford, RDCS cc:  ------------------------------------------------------------ LV EF: 40% LV EF: 40% - 45%  ------------------------------------------------------------ Indications: Atrial fibrillation - 427.31.  ------------------------------------------------------------ History: PMH: Murmur. Mitral valve prolapse. Risk factors: Hypertension. Dyslipidemia.  ------------------------------------------------------------ Study Conclusions  - Left ventricle: The cavity size was mildly dilated. Wall thickness was normal. Systolic function was mildly to moderately reduced. The estimated ejection fraction was 40%, in the range of 40% to 45%. Diffuse hypokinesis. The study is not technically sufficient to allow evaluation of LV diastolic function. Doppler parameters are consistent with high ventricular filling pressure. - Mitral valve: Severe prolapse, involving the anterior leaflet and the posterior leaflet. Severe regurgitation. - Left atrium: The atrium was moderately dilated. - Right atrium: The atrium was mildly dilated. - Pericardium, extracardiac: A small pericardial effusion was identified. Impressions:  - Markedly abnormal MV with thickening, bileaflet prolapse and probable severe MR (difficult to quantitate due to chaotic nature of jet); suggest TEE to further assess. Transthoracic echocardiography. M-mode, complete 2D, spectral Doppler, and color Doppler. Height:  Height: 170.2cm. Height: 67in. Weight: Weight: 62.8kg. Weight: 138.2lb. Body mass index: BMI: 21.7kg/m^2. Body surface area: BSA: 1.67m^2. Blood pressure: 99/73. Patient status: Inpatient. Location: Echo laboratory.  ------------------------------------------------------------  ------------------------------------------------------------ Left ventricle: The cavity size was mildly dilated. Wall thickness was normal. Systolic function was mildly to moderately reduced. The estimated ejection fraction was 40%, in the range of 40% to 45%. Diffuse hypokinesis. The study is not technically sufficient to allow evaluation of LV diastolic function. Doppler parameters are consistent with high ventricular filling pressure.  ------------------------------------------------------------ Aortic valve: Trileaflet; mildly thickened leaflets. Mobility was not restricted. Doppler: Transvalvular velocity was within the normal range. There was no stenosis. No regurgitation.  ------------------------------------------------------------ Aorta: Aortic root: The aortic root was normal in size.  ------------------------------------------------------------ Mitral valve: Moderately thickened leaflets . Mobility was not restricted. Severe prolapse, involving the anterior leaflet and the posterior leaflet. Doppler: Transvalvular velocity was within the normal range. There was no evidence for stenosis. Severe regurgitation. Peak gradient: 9mm Hg (D).  ------------------------------------------------------------ Left atrium: The atrium was moderately dilated.  ------------------------------------------------------------ Right ventricle: The cavity size was normal. Systolic function was normal.  ------------------------------------------------------------ Pulmonic valve: Doppler: Transvalvular velocity was within the normal range. There was no evidence for  stenosis.  ------------------------------------------------------------ Tricuspid valve: Structurally normal valve. Doppler: Transvalvular velocity was within the normal range. Trivial regurgitation.  ------------------------------------------------------------ Pulmonary artery: Systolic pressure was within the normal range.  ------------------------------------------------------------ Right atrium: The atrium was mildly dilated.  ------------------------------------------------------------ Pericardium: A small pericardial effusion was identified.  ------------------------------------------------------------ Systemic veins: Inferior vena cava: The vessel was normal in size.  ------------------------------------------------------------  2D measurements Normal Doppler Normal Left ventricle measurements LVID ED, 55.4 mm 43-52 Main pulmonary chord, artery PLAX Pressure, S 28 mm =30 LVID ES, 44.3 mm 23-38 Hg chord, Left ventricle PLAX Ea, lat 9.65 cm/ ------- FS, chord, 20 % >29 ann, tiss s PLAX DP LVPW, ED 9.94 mm ------ E/Ea, lat 15.65 ------- IVS/LVPW 1 <1.3 ann, tiss ratio, ED DP Ventricular septum Ea, med 8.44  cm/ ------- IVS, ED 9.94 mm ------ ann, tiss s Aorta DP Root diam, 33 mm ------ E/Ea, med 17.89 ------- ED ann, tiss Left atrium DP AP dim 50 mm ------ Mitral valve AP dim 2.9 cm/m^2 <2.2 Peak E vel 151 cm/ ------- index s Peak A vel 36.7 cm/ ------- s Deceleratio 232 ms 150-230 n time Peak 9 mm ------- gradient, D Hg Peak E/A 4.1 ------- ratio Regurg 36.8 cm/ ------- alias vel, s PISA Max regurg 554 cm/ ------- vel s Regurg VTI 159 cm ------- ERO, PISA 0.5 cm^ ------- 2 Regurg vol, 80 ml ------- PISA Tricuspid valve Regurg peak 210 cm/ ------- vel s Peak RV-RA 18 mm ------- gradient, S Hg Systemic veins Estimated 10 mm ------- CVP Hg Right ventricle Pressure, S 28 mm <30 Hg Sa vel, lat 9.43 cm/ ------- ann, tiss  s DP  ------------------------------------------------------------ Prepared and Electronically Authenticated by  Olga Millers [id number]    TRANSESOPHAGEAL ECHOCARDIOGRAM  NAME: [name] [name] MRN: [id number]  DOB: 11-Jan-1941 ADMIT DATE: 05/15/2012  INDICATIONS: Symptomatic AF  PROCEDURE:  Informed consent was obtained prior to the procedure. The risks, benefits and alternatives for the procedure were discussed and the patient comprehended these risks. Risks include, but are not limited to, cough, sore throat, vomiting, nausea, somnolence, esophageal and stomach trauma or perforation, bleeding, low blood pressure, aspiration, pneumonia, infection, trauma to the teeth and death.  After a procedural time-out, the patient was given 5 mg versed and 75 mcg fentanyl for moderate sedation. The oropharynx was anesthetized 10 cc of topical 1% viscous lidocaine. The transesophageal probe was inserted in the esophagus and stomach without difficulty and multiple views were obtained.  FINDINGS:  LEFT VENTRICLE: EF = 45%. Global HK   RIGHT VENTRICLE: Mildly hypokinetic  LEFT ATRIUM: Severely enlarged  LEFT ATRIAL APPENDAGE: No clot  RIGHT ATRIUM: Mildly dialted  AORTIC VALVE: Trileaflet. Mildly calcified. No AI/AS  MITRAL VALVE: The posterior leaflet is markedly abnormal. It is very thickened with severe prolapse particularly in the P2 region. There may be a cleft or perforation in that region as well. There is severe MR with 2 eccentric jets.  TRICUSPID VALVE: Normal. Mild Tr  PULMONIC VALVE: Not visualized  INTERATRIAL SEPTUM: No PFO or ASD  PERICARDIUM: No effusion  DESCENDING AORTA: Moderate to severe plaque.  Cardioversion not performed due to size of LA and need for cath and probably MV surgery.  Daniel Bensimhon,MD  10:09 AM      Impression:  The patient has severe symptomatic mitral regurgitation with long-standing mitral valve prolapse. Left ventricular function is  moderately reduced. She presents with class IV congestive heart failure and presumably recent onset persistent atrial fibrillation.  Cardiac catheterization has not yet been performed. The patient is already anemic and she reports to be a member of the [employer] faith and at this point she does not feel that she would want any type of blood products to be administered.  Plan:  I've offered to meet with the patient and her family Monday afternoon once her heart catheterization as been performed to discuss matters further. I would advise against regular phlebotomy for the sake of conserving the patient's blood as much as possible. She may need a delayed period of time after her cath before elective surgery could be considered with acceptable associated risk in order to allow her blood count to normalize.  Epogen should be considered.    Salvatore Decent. Cornelius Moras, MD 05/19/2012 6:35 PM    I spent  in excess of 90 minutes of time directly involved in the conduct of this consultation.

## 2012-05-19 NOTE — Interval H&P Note (Signed)
History and Physical Interval Note:  05/19/2012 9:21 AM  [name] [name]  has presented today for surgery, with the diagnosis of afib  The various methods of treatment have been discussed with the patient and family. After consideration of risks, benefits and other options for treatment, the patient has consented to  Procedure(s) (LRB) with comments: TRANSESOPHAGEAL ECHOCARDIOGRAM (TEE) (N/A) CARDIOVERSION (N/A) as a surgical intervention .  The patient's history has been reviewed, patient examined, no change in status, stable for surgery.  I have reviewed the patient's chart and labs.  Questions were answered to the patient's satisfaction.     Gentry Pilson

## 2012-05-19 NOTE — Progress Notes (Addendum)
SUBJECTIVE: Breathing is better. NO chest pain.   BP 101/68  Pulse 109  Temp 98.1 F (36.7 C) (Oral)  Resp 20  Ht 5\' 7"  (1.702 m)  Wt 130 lb 8.2 oz (59.2 kg)  BMI 20.44 kg/m2  SpO2 98%  Intake/Output Summary (Last 24 hours) at 05/19/12 0724 Last data filed at 05/19/12 0400  Gross per 24 hour  Intake 1347.33 ml  Output   1050 ml  Net 297.33 ml    PHYSICAL EXAM General: Well developed, well nourished, in no acute distress. Alert and oriented x 3.  Psych:  Good affect, responds appropriately Neck: No JVD. No masses noted.  Lungs: Clear bilaterally with no wheezes or rhonci noted.  Heart: Irreg irreg with systolic murmur Abdomen: Bowel sounds are present. Soft, non-tender.  Extremities: No lower extremity edema.   LABS: Basic Metabolic Panel:  Basename 05/19/12 0605 05/18/12 0500  NA 137 140  K 4.0 3.6  CL 103 104  CO2 25 26  GLUCOSE 104* 104*  BUN 12 11  CREATININE 0.97 0.89  CALCIUM 10.1 10.3  MG -- 1.9  PHOS -- 3.1   CBC:  Basename 05/19/12 0605 05/18/12 0500  WBC 7.9 8.8  NEUTROABS -- --  HGB 12.3 12.4  HCT 36.6 36.2  MCV 89.1 89.2  PLT 181 184   Cardiac Enzymes:  Basename 05/16/12 1115  CKTOTAL --  CKMB --  CKMBINDEX --  TROPONINI <0.30   Current Meds:    . aspirin EC  81 mg Oral Daily  . atorvastatin  40 mg Oral q1800  . azithromycin  500 mg Intravenous Q24H  . cefTAZidime (FORTAZ)  IV  1 g Intravenous Q8H  . diltiazem  120 mg Oral Daily  . furosemide  40 mg Oral Daily  . nebivolol  10 mg Oral Daily  . potassium chloride  40 mEq Oral Daily  . saccharomyces boulardii  250 mg Oral BID  . sodium chloride  3 mL Intravenous Q12H     ASSESSMENT AND PLAN:  1. Atrial fibrillation: New diagnosis. Rate controlled on po cardizem and po beta blocker. She is being anti-coagulated with heparin IV. Will plan TEE/DCCV today. She will need long term anti-coagulation. Will likely use one of the newer agents. This has not been started yet given the  possibility of need for thoracentesis over the last few days. If her mitral regurgitation remains severe on TEE today, she will likely need a cardiac cath Monday to define possible CAD so would continue heparin over weekend.   (Addendum 05/19/12 at 7:30pm: Will d/c am labs and change heparin to Lovenox to limit blood draws given anemia and refusal of blood products based on religion. Pt not cardioverted today with pending cath and need to hold anti-coagulation next week. See note Dr. Cornelius Moras)  2. Pleural effusion/pneumonia: Appreciate pulmonary consultation. Effusion smaller after diuresis so will not likely need thoracentesis. Antibiotics per Pulmonary.   3. Severe MR: Will need TEE to further evaluate. This will be arranged for today. If MR is severe in setting of reduced LV function, will need cardiac cath next week and surgical consultation.       (Addendum at 1:21pm on 05/19/12. Pt did well with TEE. DCCV cancelled today with large LA, need for cath which will interrupt heparin. D/W Dr. Gala Romney. Will plan cath on Monday since she had heavy sedation this am. She is anxious and would rather wait until Monday. Will ask CT surgery to see over the next few  days to assess possibility of MV replacement. R/L heart cath Monday. Pre-cath orders have been placed).   4. LV systolic dysfunction with acute CHF: Appears to be euvolemic. Continue po Lasix.    5. Dispo: NPO TEE/DCCV today.      [name] [name]  1/24/20147:24 AM

## 2012-05-20 DIAGNOSIS — I5033 Acute on chronic diastolic (congestive) heart failure: Secondary | ICD-10-CM

## 2012-05-20 MED ORDER — DARBEPOETIN ALFA-POLYSORBATE 25 MCG/0.42ML IJ SOLN
25.0000 ug | Freq: Once | INTRAMUSCULAR | Status: AC
  Administered 2012-05-20: 25 ug via SUBCUTANEOUS
  Filled 2012-05-20: qty 0.42

## 2012-05-20 NOTE — Progress Notes (Signed)
Patient ID: [name] [name], female   DOB: 1940-08-01, 72 y.o.   MRN: [id number]    SUBJECTIVE: Breathing is better. NO chest pain. Not much insight into cath  BP 101/67  Pulse 125  Temp 97.5 F (36.4 C) (Oral)  Resp 18  Ht 5\' 7"  (1.702 m)  Wt 132 lb 14.4 oz (60.283 kg)  BMI 20.82 kg/m2  SpO2 98%  Intake/Output Summary (Last 24 hours) at 05/20/12 0749 Last data filed at 05/20/12 0500  Gross per 24 hour  Intake    856 ml  Output   1150 ml  Net   -294 ml    PHYSICAL EXAM General: Well developed, well nourished, in no acute distress. Alert and oriented x 3.  Psych:  Good affect, responds appropriately Neck: No JVD. No masses noted.  Lungs: Clear bilaterally with no wheezes or rhonci noted.  Heart: Irreg irreg with systolic murmur Abdomen: Bowel sounds are present. Soft, non-tender.  Extremities: No lower extremity edema.   LABS: Basic Metabolic Panel:  Basename 05/19/12 0605 05/18/12 0500  NA 137 140  K 4.0 3.6  CL 103 104  CO2 25 26  GLUCOSE 104* 104*  BUN 12 11  CREATININE 0.97 0.89  CALCIUM 10.1 10.3  MG -- 1.9  PHOS -- 3.1   CBC:  Basename 05/19/12 0605 05/18/12 0500  WBC 7.9 8.8  NEUTROABS -- --  HGB 12.3 12.4  HCT 36.6 36.2  MCV 89.1 89.2  PLT 181 184   Cardiac Enzymes: No results found for this basename: CKTOTAL:3,CKMB:3,CKMBINDEX:3,TROPONINI:3 in the last 72 hours Current Meds:    . aspirin EC  81 mg Oral Daily  . atorvastatin  40 mg Oral q1800  . azithromycin  500 mg Oral Daily  . diltiazem  120 mg Oral Daily  . enoxaparin (LOVENOX) injection  60 mg Subcutaneous Q12H  . furosemide  40 mg Oral Daily  . nebivolol  10 mg Oral Daily  . potassium chloride  40 mEq Oral Daily  . saccharomyces boulardii  250 mg Oral BID  . sodium chloride  3 mL Intravenous Q12H     ASSESSMENT AND PLAN:  1. Atrial fibrillation: New diagnosis. Rate controlled on po cardizem and po beta blocker. She is being anti-coagulated with heparin IV.  TEE with severe MR  and LAE  She will need long term anti-coagulation. Will likely use one of the newer agents. This has not been started yet given the possibility of need for thoracentesis over the last few days. Cardiac cath Monday to define possible CAD   2. Pleural effusion/pneumonia: Appreciate pulmonary consultation. Effusion smaller after diuresis so will not likely need thoracentesis. Antibiotics per Pulmonary.   3. Severe MR: Cath Monday She would like to go home Monday and have surgery in a few weeks.     4. LV systolic dysfunction with acute CHF: Appears to be euvolemic. Continue po Lasix.    5. Dispo: Home Monday after cath and f/u [facility] for MVR  6. Anemia:  Start Epogen      Charlton Haws  1/25/20147:49 AM

## 2012-05-20 NOTE — Progress Notes (Signed)
Earlier this am , pts HR 120-140s a-fib sustained in that range. Am meds given and [name] Mott PA  made aware. After meds, HR in the 110- 120s

## 2012-05-21 ENCOUNTER — Inpatient Hospital Stay (HOSPITAL_COMMUNITY): Payer: Medicare Other

## 2012-05-21 MED ORDER — SODIUM CHLORIDE 0.9 % IJ SOLN: 3.0000 mL | INTRAMUSCULAR | Status: DC | PRN

## 2012-05-21 MED ORDER — SODIUM CHLORIDE 0.9 % IV SOLN
INTRAVENOUS | Status: DC
  Administered 2012-05-22: 50 mL/h via INTRAVENOUS

## 2012-05-21 MED ORDER — DIAZEPAM 5 MG PO TABS
5.0000 mg | ORAL_TABLET | ORAL | Status: AC
  Administered 2012-05-22: 5 mg via ORAL
  Filled 2012-05-21: qty 1

## 2012-05-21 MED ORDER — ASPIRIN 81 MG PO CHEW
324.0000 mg | CHEWABLE_TABLET | ORAL | Status: AC
  Administered 2012-05-22: 324 mg via ORAL
  Filled 2012-05-21 (×2): qty 4

## 2012-05-21 MED ORDER — SODIUM CHLORIDE 0.9 % IJ SOLN
3.0000 mL | Freq: Two times a day (BID) | INTRAMUSCULAR | Status: DC
  Administered 2012-05-21: 3 mL via INTRAVENOUS

## 2012-05-21 MED ORDER — SODIUM CHLORIDE 0.9 % IV SOLN: 250.0000 mL | INTRAVENOUS | Status: DC | PRN

## 2012-05-21 NOTE — Progress Notes (Signed)
  Patient ID: [name] [name], female   DOB: 1940/12/17, 72 y.o.   MRN: [id number]    SUBJECTIVE: Breathing is better. NO chest pain. [place]  BP 96/54  Pulse 116  Temp 98.2 F (36.8 C) (Oral)  Resp 18  Ht 5\' 7"  (1.702 m)  Wt 129 lb 12.8 oz (58.877 kg)  BMI 20.33 kg/m2  SpO2 97%  Intake/Output Summary (Last 24 hours) at 05/21/12 0959 Last data filed at 05/20/12 2124  Gross per 24 hour  Intake   1040 ml  Output   2375 ml  Net  -1335 ml    PHYSICAL EXAM General: Well developed, well nourished, in no acute distress. Alert and oriented x 3.  Psych:  Good affect, responds appropriately Neck: No JVD. No masses noted.  Lungs: Clear bilaterally with no wheezes or rhonci noted.  Heart: Irreg irreg with systolic murmur loud MR Abdomen: Bowel sounds are present. Soft, non-tender.  Extremities: No lower extremity edema.   LABS: Basic Metabolic Panel:  Basename 05/19/12 0605  NA 137  K 4.0  CL 103  CO2 25  GLUCOSE 104*  BUN 12  CREATININE 0.97  CALCIUM 10.1  MG --  PHOS --   CBC:  Basename 05/19/12 0605  WBC 7.9  NEUTROABS --  HGB 12.3  HCT 36.6  MCV 89.1  PLT 181   Current Meds:    . aspirin  324 mg Oral Pre-Cath  . aspirin EC  81 mg Oral Daily  . atorvastatin  40 mg Oral q1800  . azithromycin  500 mg Oral Daily  . diazepam  5 mg Oral On Call  . diltiazem  120 mg Oral Daily  . enoxaparin (LOVENOX) injection  60 mg Subcutaneous Q12H  . furosemide  40 mg Oral Daily  . nebivolol  10 mg Oral Daily  . potassium chloride  40 mEq Oral Daily  . saccharomyces boulardii  250 mg Oral BID  . sodium chloride  3 mL Intravenous Q12H  . sodium chloride  3 mL Intravenous Q12H     ASSESSMENT AND PLAN:  1. Atrial fibrillation: New diagnosis. Rate controlled on po cardizem and po beta blocker. She is being anti-coagulated with heparin IV.  TEE with severe MR and LAE  She will need long term anti-coagulation. Will likely use one of the newer agents. This has not been  started yet given the possibility of need for thoracentesis over the last few days. Cardiac cath Monday to define possible CAD   2. Pleural effusion/pneumonia: Appreciate pulmonary consultation. Effusion smaller after diuresis so will not likely need thoracentesis. Antibiotics per Pulmonary.   3. Severe MR: Cath Monday She would like to go home Monday and have surgery in a few weeks.     4. LV systolic dysfunction with acute CHF: Appears to be euvolemic. Continue po Lasix.    5. Dispo: Home Monday after cath and f/u [facility] for MVR  6. Anemia:  Aranasp written for yesterday Jehov[employer] Witness    Charlton Haws  1/26/20149:59 AM

## 2012-05-22 ENCOUNTER — Encounter (HOSPITAL_COMMUNITY): Payer: Self-pay | Admitting: Internal Medicine

## 2012-05-22 ENCOUNTER — Encounter (HOSPITAL_COMMUNITY)
Admission: EM | Disposition: A | Discharge status: Home / Self Care | Payer: Self-pay | Source: Home / Self Care | Attending: Cardiology

## 2012-05-22 ENCOUNTER — Inpatient Hospital Stay (HOSPITAL_COMMUNITY): Payer: Medicare Other

## 2012-05-22 DIAGNOSIS — I509 Heart failure, unspecified: Secondary | ICD-10-CM

## 2012-05-22 DIAGNOSIS — I059 Rheumatic mitral valve disease, unspecified: Secondary | ICD-10-CM

## 2012-05-22 DIAGNOSIS — I739 Peripheral vascular disease, unspecified: Secondary | ICD-10-CM

## 2012-05-22 HISTORY — PX: LEFT AND RIGHT HEART CATHETERIZATION WITH CORONARY ANGIOGRAM: SHX5449

## 2012-05-22 LAB — CBC
Hemoglobin: 14.6 g/dL (ref 12.0–15.0)
MCH: 31.1 pg (ref 26.0–34.0)
MCV: 89.6 fL (ref 78.0–100.0)
RBC: 4.69 MIL/uL (ref 3.87–5.11)
WBC: 13.8 10*3/uL — ABNORMAL HIGH (ref 4.0–10.5)

## 2012-05-22 LAB — POCT I-STAT 3, VENOUS BLOOD GAS (G3P V)
Bicarbonate: 25.4 mEq/L — ABNORMAL HIGH (ref 20.0–24.0)
O2 Saturation: 65 %
pCO2, Ven: 41.4 mmHg — ABNORMAL LOW (ref 45.0–50.0)
pO2, Ven: 34 mmHg (ref 30.0–45.0)

## 2012-05-22 LAB — PULMONARY FUNCTION TEST

## 2012-05-22 LAB — POCT I-STAT 3, ART BLOOD GAS (G3+)
Acid-base deficit: 1 mmol/L (ref 0.0–2.0)
Bicarbonate: 23.6 mEq/L (ref 20.0–24.0)
O2 Saturation: 92 %
TCO2: 25 mmol/L (ref 0–100)
pCO2 arterial: 38.3 mmHg (ref 35.0–45.0)
pH, Arterial: 7.398 (ref 7.350–7.450)
pO2, Arterial: 64 mmHg — ABNORMAL LOW (ref 80.0–100.0)

## 2012-05-22 SURGERY — LEFT AND RIGHT HEART CATHETERIZATION WITH CORONARY ANGIOGRAM
Anesthesia: LOCAL

## 2012-05-22 MED ORDER — NITROGLYCERIN 0.2 MG/ML ON CALL CATH LAB
INTRAVENOUS | Status: AC
  Filled 2012-05-22: qty 1

## 2012-05-22 MED ORDER — SODIUM CHLORIDE 0.9 % IJ SOLN: 3.0000 mL | Freq: Two times a day (BID) | INTRAMUSCULAR | Status: DC

## 2012-05-22 MED ORDER — MIDAZOLAM HCL 2 MG/2ML IJ SOLN
INTRAMUSCULAR | Status: AC
  Filled 2012-05-22: qty 2

## 2012-05-22 MED ORDER — DILTIAZEM HCL ER COATED BEADS 180 MG PO CP24: 180.0000 mg | ORAL_CAPSULE | Freq: Every day | ORAL | Status: DC

## 2012-05-22 MED ORDER — DILTIAZEM HCL ER COATED BEADS 240 MG PO CP24: 240.0000 mg | ORAL_CAPSULE | Freq: Every day | ORAL | Status: DC

## 2012-05-22 MED ORDER — AMIODARONE HCL 200 MG PO TABS
200.0000 mg | ORAL_TABLET | Freq: Two times a day (BID) | ORAL | Status: DC
  Administered 2012-05-22 – 2012-05-24 (×4): 200 mg via ORAL
  Filled 2012-05-22 (×5): qty 1

## 2012-05-22 MED ORDER — ALBUTEROL SULFATE (5 MG/ML) 0.5% IN NEBU
2.5000 mg | INHALATION_SOLUTION | Freq: Once | RESPIRATORY_TRACT | Status: AC
  Administered 2012-05-22: 2.5 mg via RESPIRATORY_TRACT

## 2012-05-22 MED ORDER — HEPARIN (PORCINE) IN NACL 2-0.9 UNIT/ML-% IJ SOLN
INTRAMUSCULAR | Status: AC
  Filled 2012-05-22: qty 1000

## 2012-05-22 MED ORDER — FENTANYL CITRATE 0.05 MG/ML IJ SOLN
INTRAMUSCULAR | Status: AC
  Filled 2012-05-22: qty 2

## 2012-05-22 MED ORDER — ENOXAPARIN SODIUM 60 MG/0.6ML ~~LOC~~ SOLN
1.0000 mg/kg | Freq: Two times a day (BID) | SUBCUTANEOUS | Status: DC
  Administered 2012-05-22: 60 mg via SUBCUTANEOUS
  Filled 2012-05-22 (×3): qty 0.6

## 2012-05-22 MED ORDER — METOPROLOL TARTRATE 12.5 MG HALF TABLET: 25.0000 mg | ORAL_TABLET | Freq: Two times a day (BID) | ORAL | Status: DC

## 2012-05-22 MED ORDER — SODIUM CHLORIDE 0.9 % IV SOLN: 250.0000 mL | INTRAVENOUS | Status: DC | PRN

## 2012-05-22 MED ORDER — SODIUM CHLORIDE 0.9 % IV SOLN
INTRAVENOUS | Status: AC
  Administered 2012-05-22: 15:00:00 via INTRAVENOUS

## 2012-05-22 MED ORDER — SODIUM CHLORIDE 0.9 % IJ SOLN: 3.0000 mL | INTRAMUSCULAR | Status: DC | PRN

## 2012-05-22 MED ORDER — LIDOCAINE HCL (PF) 1 % IJ SOLN
INTRAMUSCULAR | Status: AC
  Filled 2012-05-22: qty 30

## 2012-05-22 NOTE — Progress Notes (Addendum)
TCTS BRIEF PROGRESS NOTE   Ms Rufer has remained clinically stable over the weekend and awaits cath scheduled for today.  She tells me that she hopes to go home this afternoon.  If she does I will arrange for f/u visit in my office to discuss results of cath and risks/benefits of surgery.  Under the circumstances I think it will be wise for her to have several family members with her.  OWEN,CLARENCE H 05/22/2012 9:29 AM

## 2012-05-22 NOTE — CV Procedure (Signed)
    Cardiac Catheterization Operative Report  [name] [name] [id number] 1/27/20141:00 PM ARONSON,RICHARD A, MD  Procedure Performed:  1. Left Heart Catheterization 2. Selective Coronary Angiography 3. Right Heart Catheterization 4. Left ventricular angiogram 5. Distal Aortogram  Operator: Verne Carrow, MD  Indication:  72 yo female with history of mitral valve prolapse admitted with atrial fibrillation with RVR, CHF and found to have  LVEF 40%, severe MR . She has been seen by Dr. Cornelius Moras with CT surgery and plans are being made for mitral valve replacement.                               Procedure Details: The risks, benefits, complications, treatment options, and expected outcomes were discussed with the patient. The patient and/or family concurred with the proposed plan, giving informed consent. The patient was brought to the cath lab after IV hydration was begun and oral premedication was given. The patient was further sedated with Versed and Fentanyl. The left groin was prepped and draped in the usual manner. Using the modified Seldinger access technique, a 5 French sheath was placed in the left femoral artery. A 6 French sheath was inserted into the left femoral vein. A multi-purpose catheter was used to perform a right heart catheterization. Standard diagnostic catheters were used to perform selective coronary angiography. A pigtail catheter was used to perform a left ventricular angiogram as well as a thoracic aortogram, abdominal aortogram with visualization of both iliac arteries. There were no immediate complications. The patient was taken to the recovery area in stable condition.   Hemodynamic Findings: Ao: 81/57               LV: 81/22/34 RA: 5               RV: 23/2/5 PA:  23/12 (mean 17)       PCWP:  15 Fick Cardiac Output: 4.1 L/min Fick Cardiac Index: 2.5 L/min/m2 Central Aortic Saturation: 92% Pulmonary Artery Saturation: 65%  Angiographic Findings:  Left  main:  No obstructive disease.   Left Anterior Descending Artery: Moderate caliber vessel that does not reach the apex. There is a moderate caliber diagonal branch with no obstructive disease noted.   Circumflex Artery: Moderate caliber vessel with no obstructive disease noted. The first obtuse marginal branch is moderate in caliber and has no disease.   Right Coronary Artery: Large caliber dominant vessel with no obstructive disease noted.   Left Ventricular Angiogram: LVEF=40% with global hypokinesis.   Impression: 1. No angiographic evidence of CAD 2. Moderate global LV systolic dysfunction 3. Severe mitral regurgitation 4. Atrial fibrillation  Recommendations: Will attempt better rate control of her atrial fibrillation. Will discuss best anti-coagulation options since she will need surgery in the near future. I would prefer Xarelto but if she is going to have MVR with Maze this week then would continue with Lovenox. If adequate rate control cannot be obtained, will have to consider cardioversion. Will discuss with Dr. Cornelius Moras.   Complications:  None; patient tolerated the procedure well.

## 2012-05-22 NOTE — Progress Notes (Addendum)
ANTICOAGULATION CONSULT NOTE - Follow Up Consult  Pharmacy Consult for Lovenox Indication: atrial fibrillation  Allergies  Allergen Reactions  . Sulfa Drugs Cross Reactors Other (See Comments)    Unknown.  . Aspirin Rash  . Naproxen Sodium Swelling and Rash   Patient Measurements: Height: 5\' 7"  (170.2 cm) Weight: 128 lb 4.9 oz (58.2 kg) (scale B) IBW/kg (Calculated) : 61.6   Vital Signs: Temp: 97.6 F (36.4 C) (01/27 0501) Temp src: Oral (01/27 0501) BP: 108/53 mmHg (01/27 0501) Pulse Rate: 113  (01/27 0501)  Labs:  Basename 05/22/12 0555  HGB 14.6  HCT 42.0  PLT 233  APTT --  LABPROT --  INR --  HEPARINUNFRC --  CREATININE --  CKTOTAL --  CKMB --  TROPONINI --    Estimated Creatinine Clearance: 49.6 ml/min (by C-G formula based on Cr of 0.97).  Assessment: 72 y/o female who was sent from Cardiology office to the ED 1/20 for treatment of newly diagnosed Afib.   Anticoagulation: New onset Afib; Now on Lovenox to minimize blood loss from HL draws. Being considered for MV surgery and cath planned on Monday. No blood products per religion. Hgb up to 14.6. CrCl 50.  Infectious Disease:  Azith-MD: PNA; Pulm managing abx. Afebrile, WBC up to 13.8. See MD sticky note. Levo 1/17>>1/21 ceftaz per RX 1/21>>1/24 azith 1/21>>  Cardiovascular: hx HTN, HLD and MVP; new Afib - Cardioversion not performed due to size of LA and probably MV surgery in a few weeks - Cath Monday. Meds: ASA 81mg , Lipitor, diltiazem, po Lasix, Nebivolol, K+  GI: Florastor  Endocrinology: OK  Nephrology: CrCl 50  Pulmonary: [place] c/w large effusion - repeated US 1/22, significant reduction --> delay thoracentesis, continue lasix  Hematology / Oncology: h/h and plts wnl. Aranesp x1 dose 1/25.  PTA Medication Issues: meds resumed  Best Practices: Lovenox  Goal of Therapy:  Anti-Xa level 0.6-1.2 units/ml 4hrs after LMWH dose given Monitor platelets by anticoagulation protocol: Yes   Plan:    Lovenox 60mg  sq q12h Cath today. CBC q72h Plan to start one of the newer oral anticoagulants at discharge   Crystal S. Merilynn Finland, PharmD, [facility] Clinical Staff Pharmacist Pager [phone]  05/22/2012 10:27 AM  Received orders post cath to resume lovenox dosing 8h post sheath removal (done 13:30) Last Sq dose was 21:30 on 05/21/12.  Plan: Lovenox 60mg  sq q12 hours - start at 22:00 tonight Continue with CBC q 72h  Follow up plan for surgery/cardioversion  Severiano Gilbert  05/22/2012 3:02 PM

## 2012-05-22 NOTE — Progress Notes (Signed)
PFT completed. Unconfirmed results placed in [facility] chart and faxed to Dr Orvan July office.

## 2012-05-22 NOTE — Progress Notes (Signed)
Patient transferred from Cath Lab via stretcher, VSS, patient oriented to room but is currently mildly sedated from sedation from Cath procedure. Patient's left groin area level zero, no drainage present. Educated patient on reason for bedrest until 1930 this evening. Patient and family understood for son was present. Patient able to tolerate clear liquids and may advance to full liquids for dinner. Vital signs taken per Cath procedure protocol and will be documented. Currently patient complains of no pain or discomfort. Will continue to monitor to end of shift.

## 2012-05-22 NOTE — Progress Notes (Signed)
Pt s/p cath today with no evidence of CAD.   Her issues are as follows:   1. Severe MR: Discussed case with Dr. Cornelius Moras. MVR complicated by her religious beliefs with refusal for blood products. Her H/H is stable. He will talk to the patient and family tonight about options.   2. Atrial fibrillation: Will load with amiodarone tonight. No evidence of atrial clot on TEE last week. She has been on Lovenox over the weekend. Continue Cardizem. Will d/c beta blocker.If she does not have surgery this week, will need to be started on Xarelto tomorrow. Prefer not to cardiovert if she will need to stop anti-coagulation in the next week or two for surgery.   3. Acute on chronic systolic CHF: Continue Lasix  [name] [name] 1:51 PM 05/22/2012

## 2012-05-22 NOTE — Interval H&P Note (Signed)
History and Physical Interval Note:  05/22/2012 12:09 PM  [name] [name]  has presented today for right and left heart cath with the diagnosis of mitral regurgitation  The various methods of treatment have been discussed with the patient and family. After consideration of risks, benefits and other options for treatment, the patient has consented to  Procedure(s) (LRB) with comments: LEFT AND RIGHT HEART CATHETERIZATION WITH CORONARY ANGIOGRAM (N/A) as a surgical intervention .  The patient's history has been reviewed, patient examined, no change in status, stable for surgery.  I have reviewed the patient's chart and labs.  Questions were answered to the patient's satisfaction.     MCALHANY,CHRISTOPHER

## 2012-05-22 NOTE — Progress Notes (Addendum)
CARDIOTHORACIC SURGERY PROGRESS NOTE  Day of Surgery  S/P Procedure(s) (LRB): LEFT AND RIGHT HEART CATHETERIZATION WITH CORONARY ANGIOGRAM (N/A)  Subjective: Patient feels better.  Mild SOB  Objective: Vital signs in last 24 hours: Temp:  [97.6 F (36.4 C)-97.9 F (36.6 C)] 97.8 F (36.6 C) (01/27 2020) Pulse Rate:  [80-123] 108  (01/27 2020) Cardiac Rhythm:  [-]  Resp:  [18-22] 18  (01/27 2020) BP: (80-112)/(49-79) 97/55 mmHg (01/27 2020) SpO2:  [95 %-98 %] 96 % (01/27 2020) Weight:  [58.2 kg (128 lb 4.9 oz)] 58.2 kg (128 lb 4.9 oz) (01/27 0501)  Physical Exam:  Rhythm:   Afib  Breath sounds: Few basilar crackles  Heart sounds:  irreg w/ systolic murmur  Incisions:  n/a  Abdomen:  soft  Extremities:  warm   Intake/Output from previous day: 01/26 0701 - 01/27 0700 In: 995.5 [P.O.:696; I.V.:299.5] Out: 1825 [Urine:1825] Intake/Output this shift:    Lab Results:  Basename 05/22/12 0555  WBC 13.8*  HGB 14.6  HCT 42.0  PLT 233   BMET: No results found for this basename: NA:2,K:2,CL:2,CO2:2,GLUCOSE:2,BUN:2,CREATININE:2,CALCIUM:2 in the last 72 hours  CBG (last 3)  No results found for this basename: GLUCAP:3 in the last 72 hours PT/INR:  No results found for this basename: LABPROT,INR in the last 72 hours  CXR:  *RADIOLOGY REPORT*  Clinical Data: The defibrillation, heart failure and right pleural  effusion.  CHEST - 2 VIEW  Comparison: 05/19/2012 and 05/16/2012.  Findings: Since the prior two view study on 05/16/2012, there  clearly is improvement in aeration of the right lower lobe and  further decrease in right pleural effusion with a small amount of  pleural fluid remaining. No pulmonary edema is identified. There  is stable cardiac enlargement.  IMPRESSION:  Improved right lower lobe aeration and decrease in right pleural  fluid with a small right pleural effusion remaining.  Original Report Authenticated By: Irish Lack, M.D.   Cardiac  Catheterization Operative Report  SEMONE ORLOV [id number] 1/27/20141:00 PM ARONSON,RICHARD A, MD  Procedure Performed:   1. Left Heart Catheterization  2. Selective Coronary Angiography  3. Right Heart Catheterization  4. Left ventricular angiogram  5. Distal Aortogram  Operator: Verne Carrow, MD  Indication:  72 yo female with history of mitral valve prolapse admitted with atrial fibrillation with RVR, CHF and found to have  LVEF 40%, severe MR . She has been seen by Dr. Cornelius Moras with CT surgery and plans are being made for mitral valve replacement.                               Procedure Details: The risks, benefits, complications, treatment options, and expected outcomes were discussed with the patient. The patient and/or family concurred with the proposed plan, giving informed consent. The patient was brought to the cath lab after IV hydration was begun and oral premedication was given. The patient was further sedated with Versed and Fentanyl. The left groin was prepped and draped in the usual manner. Using the modified Seldinger access technique, a 5 French sheath was placed in the left femoral artery. A 6 French sheath was inserted into the left femoral vein. A multi-purpose catheter was used to perform a right heart catheterization. Standard diagnostic catheters were used to perform selective coronary angiography. A pigtail catheter was used to perform a left ventricular angiogram as well as a thoracic aortogram, abdominal aortogram with visualization of both iliac  arteries. There were no immediate complications. The patient was taken to the recovery area in stable condition.   Hemodynamic Findings: Ao: 81/57               LV: 81/22/34 RA: 5               RV: 23/2/5 PA:  23/12 (mean 17)       PCWP:  15 Fick Cardiac Output: 4.1 L/min Fick Cardiac Index: 2.5 L/min/m2 Central Aortic Saturation: 92% Pulmonary Artery Saturation: 65%  Angiographic Findings:  Left main:  No  obstructive disease.   Left Anterior Descending Artery: Moderate caliber vessel that does not reach the apex. There is a moderate caliber diagonal branch with no obstructive disease noted.    Circumflex Artery: Moderate caliber vessel with no obstructive disease noted. The first obtuse marginal branch is moderate in caliber and has no disease.    Right Coronary Artery: Large caliber dominant vessel with no obstructive disease noted.   Left Ventricular Angiogram: LVEF=40% with global hypokinesis.   Impression: 1. No angiographic evidence of CAD 2. Moderate global LV systolic dysfunction 3. Severe mitral regurgitation 4. Atrial fibrillation  Recommendations: Will attempt better rate control of her atrial fibrillation. Will discuss best anti-coagulation options since she will need surgery in the near future. I would prefer Xarelto but if she is going to have MVR with Maze this week then would continue with Lovenox. If adequate rate control cannot be obtained, will have to consider cardioversion. Will discuss with Dr. Cornelius Moras.   Complications:  None; patient tolerated the procedure well.   Assessment/Plan: S/P Procedure(s) (LRB): LEFT AND RIGHT HEART CATHETERIZATION WITH CORONARY ANGIOGRAM (N/A)  Results of recent [facility] and cath discussed at length with Ms Pore and her family (daughter, 1 of 2 sons and another very close friend).  The rationale for elective mitral valve repair surgery has been explained, including a comparison between surgery and continued medical therapy with close follow-up.  The likelihood of successful and durable valve repair has been discussed with particular reference to the findings of their recent echocardiogram.  Based upon these findings and previous experience, I have quoted them a greater than 80 percent likelihood of successful valve repair.   Alternative surgical approaches have been discussed, including a comparison between conventional sternotomy and  minimally-invasive techniques.  The relative risks and benefits of each have been reviewed as they pertain to the patient's specific circumstances, and all of their questions have been addressed.  We spent a considerable amount of time discussing the implications of her religious faith and associated desires related to the administration of blood products during or following elective cardiac surgery.  They understand that even with uncomplicated surgery and the use of blood-conserving techniques there is a chance for the development of severe and potentially life-threatening anemia due to a combination of acute blood loss and hemodilution with any open heart surgical procedure.  They understand that severe anemia can by itself cause temporary or permanent injury to the brain, the lungs, the kidneys and/or any other organ system that could potentially be prevented by the administration of blood products.  They understand that in order to perform mitral valve surgery her blood would circulate through a heart-lung machine and that we would use a "cell saver" device to recollect as much of her own blood as possible during surgery.  We discussed the decision regarding whether or not to proceed with elective mitral valve repair/replacement +/- maze procedure in this context.  At this point the patient wants to proceed with surgery in the near future, but she wants to wait for 2-3 weeks to allow her Hgb to increase as much as possible and to think over her options further.  She remains a bit uncertain as to how to interpret her religious beliefs regarding blood products.  She has expressed a willingness to accept fractionated blood products such as albumin, but she does not think that she would accept a blood transfusion.  She wants to think about this further and I have emphasized the fact that surgical intervention is not urgent and that it's most important to be clear what her wishes are prior to surgery.  I favor d/c  home on Xarelto if her Afib and CHF can be stabilized.  I agree with starting amiodarone and I would add iron supplements.  I spent in excess of 60 minutes involved with her consultation this evening.   OWEN,CLARENCE H 05/22/2012

## 2012-05-22 NOTE — H&P (View-Only) (Signed)
  Patient ID: [name] [name], female   DOB: 02/19/1941, 72 y.o.   MRN: [id number]    SUBJECTIVE: Breathing is better. NO chest pain. [place]  BP 96/54  Pulse 116  Temp 98.2 F (36.8 C) (Oral)  Resp 18  Ht 5' 7" (1.702 m)  Wt 129 lb 12.8 oz (58.877 kg)  BMI 20.33 kg/m2  SpO2 97%  Intake/Output Summary (Last 24 hours) at 05/21/12 0959 Last data filed at 05/20/12 2124  Gross per 24 hour  Intake   1040 ml  Output   2375 ml  Net  -1335 ml    PHYSICAL EXAM General: Well developed, well nourished, in no acute distress. Alert and oriented x 3.  Psych:  Good affect, responds appropriately Neck: No JVD. No masses noted.  Lungs: Clear bilaterally with no wheezes or rhonci noted.  Heart: Irreg irreg with systolic murmur loud MR Abdomen: Bowel sounds are present. Soft, non-tender.  Extremities: No lower extremity edema.   LABS: Basic Metabolic Panel:  Basename 05/19/12 0605  NA 137  K 4.0  CL 103  CO2 25  GLUCOSE 104*  BUN 12  CREATININE 0.97  CALCIUM 10.1  MG --  PHOS --   CBC:  Basename 05/19/12 0605  WBC 7.9  NEUTROABS --  HGB 12.3  HCT 36.6  MCV 89.1  PLT 181   Current Meds:    . aspirin  324 mg Oral Pre-Cath  . aspirin EC  81 mg Oral Daily  . atorvastatin  40 mg Oral q1800  . azithromycin  500 mg Oral Daily  . diazepam  5 mg Oral On Call  . diltiazem  120 mg Oral Daily  . enoxaparin (LOVENOX) injection  60 mg Subcutaneous Q12H  . furosemide  40 mg Oral Daily  . nebivolol  10 mg Oral Daily  . potassium chloride  40 mEq Oral Daily  . saccharomyces boulardii  250 mg Oral BID  . sodium chloride  3 mL Intravenous Q12H  . sodium chloride  3 mL Intravenous Q12H     ASSESSMENT AND PLAN:  1. Atrial fibrillation: New diagnosis. Rate controlled on po cardizem and po beta blocker. She is being anti-coagulated with heparin IV.  TEE with severe MR and LAE  She will need long term anti-coagulation. Will likely use one of the newer agents. This has not been  started yet given the possibility of need for thoracentesis over the last few days. Cardiac cath Monday to define possible CAD   2. Pleural effusion/pneumonia: Appreciate pulmonary consultation. Effusion smaller after diuresis so will not likely need thoracentesis. Antibiotics per Pulmonary.   3. Severe MR: Cath Monday She would like to go home Monday and have surgery in a few weeks.     4. LV systolic dysfunction with acute CHF: Appears to be euvolemic. Continue po Lasix.    5. Dispo: Home Monday after cath and f/u Owen for MVR  6. Anemia:  Aranasp written for yesterday Jehovas Witness    Anslee Micheletti  1/26/20149:59 AM  

## 2012-05-23 LAB — CBC
MCH: 30.1 pg (ref 26.0–34.0)
MCV: 90.2 fL (ref 78.0–100.0)
Platelets: 180 10*3/uL (ref 150–400)
RBC: 4.18 MIL/uL (ref 3.87–5.11)
RDW: 14.2 % (ref 11.5–15.5)

## 2012-05-23 MED ORDER — METOPROLOL TARTRATE 25 MG PO TABS
25.0000 mg | ORAL_TABLET | Freq: Two times a day (BID) | ORAL | Status: DC
  Administered 2012-05-23 – 2012-05-24 (×2): 25 mg via ORAL
  Filled 2012-05-23 (×5): qty 1

## 2012-05-23 MED ORDER — DOCUSATE SODIUM 100 MG PO CAPS
100.0000 mg | ORAL_CAPSULE | Freq: Two times a day (BID) | ORAL | Status: DC
  Administered 2012-05-23 – 2012-05-24 (×2): 100 mg via ORAL
  Filled 2012-05-23 (×4): qty 1

## 2012-05-23 MED ORDER — WITCH HAZEL-GLYCERIN EX PADS
MEDICATED_PAD | CUTANEOUS | Status: DC | PRN
  Filled 2012-05-23: qty 100

## 2012-05-23 MED ORDER — DILTIAZEM HCL ER COATED BEADS 240 MG PO CP24
240.0000 mg | ORAL_CAPSULE | Freq: Every day | ORAL | Status: DC
  Filled 2012-05-23: qty 1

## 2012-05-23 MED ORDER — RIVAROXABAN 20 MG PO TABS
20.0000 mg | ORAL_TABLET | Freq: Every day | ORAL | Status: DC
  Administered 2012-05-23: 20 mg via ORAL
  Filled 2012-05-23 (×2): qty 1

## 2012-05-23 NOTE — Progress Notes (Signed)
    SUBJECTIVE:  BP 99/62  Pulse 111  Temp 97.4 F (36.3 C) (Oral)  Resp 18  Ht 5\' 7"  (1.702 m)  Wt 129 lb 3 oz (58.6 kg)  BMI 20.23 kg/m2  SpO2 99%  Intake/Output Summary (Last 24 hours) at 05/23/12 8469 Last data filed at 05/23/12 0848  Gross per 24 hour  Intake    480 ml  Output   1200 ml  Net   -720 ml    PHYSICAL EXAM General: Well developed, well nourished, in no acute distress. Alert and oriented x 3.  Psych:  Good affect, responds appropriately Neck: No JVD. No masses noted.  Lungs: Clear bilaterally with no wheezes or rhonci noted.  Heart: Irreg irreg with loud systolic murmur noted. Abdomen: Bowel sounds are present. Soft, non-tender.  Extremities: No lower extremity edema.   LABS:  CBC:  Basename 05/23/12 0545 05/22/12 0555  WBC 9.1 13.8*  NEUTROABS -- --  HGB 12.6 14.6  HCT 37.7 42.0  MCV 90.2 89.6  PLT 180 233   Current Meds:    . amiodarone  200 mg Oral BID  . aspirin EC  81 mg Oral Daily  . atorvastatin  40 mg Oral q1800  . azithromycin  500 mg Oral Daily  . diltiazem  180 mg Oral Daily  . enoxaparin (LOVENOX) injection  1 mg/kg Subcutaneous Q12H  . furosemide  40 mg Oral Daily  . potassium chloride  40 mEq Oral Daily  . saccharomyces boulardii  250 mg Oral BID  . sodium chloride  3 mL Intravenous Q12H     ASSESSMENT AND PLAN:   1. Atrial fibrillation: Rate is still not well controlled on Amiodarone and Cardizem. Will increase dose of Cardizem today and restart beta blocker. Will not plan DCCV during this admission. She will have a MAZE during her surgical procedure. Will start Xarelto today for anticoagulation. D/C Lovenox.   2. Acute systolic CHF: Volume status is much better. Continue daily Lasix. Cardiac cath yesterday with no significant obstructive CAD.   3. Severe MR: Confirmed by TEE. She will need mitral valve surgery (repair vs replacement).  She has been seen by Dr. Cornelius Moras with CT surgery. Plans for surgery in 2-3 weeks. She  will see him as an outpatient.   4. LV systolic dysfunction: Likely secondary to her MR. Plans in place for MVR at a later date.   5. Dispo: Ambulation today. Start Xarelto. Titrate rate control meds. Hopeful d/c home tomorrow.    Kegan Mckeithan  1/28/20149:04 AM

## 2012-05-23 NOTE — Progress Notes (Signed)
Pt c/o blood stained tissue after passing stool and severe intermittent abdominal pain.  RN visualized stool and does not appear to be an active GI bleed at this time.  Pt reports "feeling weak".  Vitals 105/79, pulse 110, 96% on RA.  MD has been made aware.  New orders given.  Will continue to monitor. Nino Glow RN

## 2012-05-23 NOTE — Progress Notes (Signed)
Text Paged on-call Cardiologist in reference to B/p-92/45. Patient blood pressure tends to be on the soft side but diastolic hasn't really been as low as 45. Patient in bed watching T.V and is not experiencing any discomfort or pain at this time. Will continue to monitor.   Attending doctor returned page and answered questions regarding blood pressure. No new orders given but to continue monitoring patient. Will notify oncoming nurse.

## 2012-05-24 ENCOUNTER — Encounter (HOSPITAL_COMMUNITY): Payer: Self-pay | Admitting: Physician Assistant

## 2012-05-24 LAB — COMPREHENSIVE METABOLIC PANEL
ALT: 50 U/L — ABNORMAL HIGH (ref 0–35)
AST: 37 U/L (ref 0–37)
Albumin: 3.6 g/dL (ref 3.5–5.2)
Alkaline Phosphatase: 97 U/L (ref 39–117)
Chloride: 104 mEq/L (ref 96–112)
Potassium: 4.3 mEq/L (ref 3.5–5.1)
Sodium: 136 mEq/L (ref 135–145)
Total Bilirubin: 0.6 mg/dL (ref 0.3–1.2)
Total Protein: 6.7 g/dL (ref 6.0–8.3)

## 2012-05-24 LAB — CBC
HCT: 36 % (ref 36.0–46.0)
MCHC: 33.6 g/dL (ref 30.0–36.0)
Platelets: 164 10*3/uL (ref 150–400)
RDW: 13.9 % (ref 11.5–15.5)
WBC: 6.5 10*3/uL (ref 4.0–10.5)

## 2012-05-24 MED ORDER — RIVAROXABAN 15 MG PO TABS
15.0000 mg | ORAL_TABLET | Freq: Every day | ORAL | Status: DC
  Administered 2012-05-24: 15 mg via ORAL
  Filled 2012-05-24: qty 1

## 2012-05-24 MED ORDER — POTASSIUM CHLORIDE CRYS ER 20 MEQ PO TBCR: 20.0000 meq | EXTENDED_RELEASE_TABLET | Freq: Every day | ORAL | Status: DC

## 2012-05-24 MED ORDER — RIVAROXABAN 15 MG PO TABS: 15.0000 mg | ORAL_TABLET | Freq: Every day | ORAL | Status: DC

## 2012-05-24 MED ORDER — POLYSACCHARIDE IRON COMPLEX 150 MG PO CAPS: 150.0000 mg | ORAL_CAPSULE | Freq: Every day | ORAL | Status: DC

## 2012-05-24 MED ORDER — FUROSEMIDE 20 MG PO TABS: 20.0000 mg | ORAL_TABLET | Freq: Every day | ORAL | Status: DC

## 2012-05-24 MED ORDER — FUROSEMIDE 20 MG PO TABS
20.0000 mg | ORAL_TABLET | Freq: Every day | ORAL | Status: DC
  Administered 2012-05-24: 20 mg via ORAL
  Filled 2012-05-24: qty 1

## 2012-05-24 MED ORDER — DILTIAZEM HCL ER COATED BEADS 120 MG PO CP24: 120.0000 mg | ORAL_CAPSULE | Freq: Every day | ORAL | Status: DC

## 2012-05-24 MED ORDER — METOPROLOL TARTRATE 25 MG PO TABS: 25.0000 mg | ORAL_TABLET | Freq: Two times a day (BID) | ORAL | Status: DC

## 2012-05-24 MED ORDER — DILTIAZEM HCL ER COATED BEADS 120 MG PO CP24
120.0000 mg | ORAL_CAPSULE | Freq: Every day | ORAL | Status: DC
  Administered 2012-05-24: 120 mg via ORAL
  Filled 2012-05-24: qty 1

## 2012-05-24 MED ORDER — AMIODARONE HCL 200 MG PO TABS: 200.0000 mg | ORAL_TABLET | Freq: Two times a day (BID) | ORAL | Status: DC

## 2012-05-24 NOTE — Progress Notes (Signed)
Utilization Review Completed.   Keatyn Luck, RN, BSN Nurse Case Manager  [phone]  

## 2012-05-24 NOTE — Progress Notes (Signed)
Patient: [name] [name] / Admit Date: 05/15/2012 / Date of Encounter: 05/24/2012, 7:35 AM   Subjective  She requested to ambulate yesterday but could not locate someone for assistance. So she ambulated around her room by herself this AM without any CP or SOB. No orthopnea, LEE. Feels well.   Objective   Telemetry: afib rates ~110 (earlier up to 119, presumably with ambulation) Physical Exam: Filed Vitals:   05/24/12 0454  BP: 97/59  Pulse: 120  Temp: 97.6 F (36.4 C)  Resp: 16   General: Well developed, well nourished thin WF in no acute distress. Head: Normocephalic, atraumatic, sclera non-icteric, no xanthomas, nares are without discharge. Neck: JVD not elevated. Lungs: Clear bilaterally to auscultation without wheezes, rales, or rhonchi. Breathing is unlabored. Heart: RRR S1 S2 with holosystolic murmur at apex. No rubs or gallops.  Abdomen: Soft, non-tender, non-distended with normoactive bowel sounds. No hepatomegaly. No rebound/guarding. No obvious abdominal masses. Msk:  Strength and tone appear normal for age. Extremities: No clubbing or cyanosis. No edema.  Distal pedal pulses are 2+ and equal bilaterally. Neuro: Alert and oriented X 3. Moves all extremities spontaneously. Psych:  Responds to questions appropriately with a normal affect.    Intake/Output Summary (Last 24 hours) at 05/24/12 0735 Last data filed at 05/24/12 4098  Gross per 24 hour  Intake    700 ml  Output   2202 ml  Net  -1502 ml    Inpatient Medications:    . amiodarone  200 mg Oral BID  . aspirin EC  81 mg Oral Daily  . atorvastatin  40 mg Oral q1800  . azithromycin  500 mg Oral Daily  . diltiazem  240 mg Oral Daily  . docusate sodium  100 mg Oral BID  . furosemide  40 mg Oral Daily  . metoprolol tartrate  25 mg Oral BID  . potassium chloride  40 mEq Oral Daily  . rivaroxaban  20 mg Oral Daily  . saccharomyces boulardii  250 mg Oral BID  . sodium chloride  3 mL Intravenous Q12H     Labs:  Basename 05/24/12 0655 05/23/12 0545  WBC 6.5 9.1  NEUTROABS -- --  HGB 12.1 12.6  HCT 36.0 37.7  MCV 88.7 90.2  PLT 164 180    Radiology/Studies:  Dg Chest 2 View 05/21/2012  *RADIOLOGY REPORT*  Clinical Data: The defibrillation, heart failure and right pleural effusion.  CHEST - 2 VIEW  Comparison: 05/19/2012 and 05/16/2012.  Findings: Since the prior two view study on 05/16/2012, there clearly is improvement in aeration of the right lower lobe and further decrease in right pleural effusion with a small amount of pleural fluid remaining.  No pulmonary edema is identified.  There is stable cardiac enlargement.  IMPRESSION: Improved right lower lobe aeration and decrease in right pleural fluid with a small right pleural effusion remaining.   Original Report Authenticated By: Irish Lack, M.D.     Assessment and Plan  1. Atrial fibrillation: Rate is still not well controlled on Amiodarone. Cardizem dose written to increase yesterday but it does not look like she got her original dose yesterday. Metoprolol was held last night due to hypotension. Per notes, no plans for DCCV during this admission. She will have a MAZE during her surgical procedure. Xarelto started yesterday. CrCl 49.25mL/min by last BMET. AST/ALT also mildly elevated by last check (was not on amio PTA). Will discuss further with MD.  2. Hypotension: limiting med use.  3. Acute systolic CHF:  Volume status now stable. Cardiac cath yesterday with no significant obstructive CAD. Could consider decreasing daily Lasix dose. Pt states before admission, she was drinking ++water because she thought it was good for her.   4. Severe MR: Confirmed by TEE. She will need mitral valve surgery (repair vs replacement). She has been seen by Dr. Cornelius Moras with CT surgery. Plans for surgery in 2-3 weeks. She will see him as an outpatient.   5. LV systolic dysfunction: Likely secondary to her MR. Plans in place for MVR at a later date.    6. Hypoxia: Diagnosis of PNA doubtful by pulm. D/c antibiotics. Likely due to CHF.  7. Dispo: Ambulate this AM. Will discuss rate control and BP with MD.   Signed, Ronie Spies PA-C  I have personally seen and examined this patient with Ronie Spies, PA-C. I agree with the assessment and plan as outlined above. She did not receive her Cardizem yesterday secondary to SBP 90s. HR is still 110-120 with atrial fib on tele. Will give Cardizem CD 120 mg this am and Lopressor 25 mg x 1 this am. Follow BP and HR. Check CMET, Ambulate. Lower Xarelto to 15 mg once daily. She is euvolemic. Will lower Lasix to 20 mg po once daily. Possible d/c this afternoon.   Carrie Usery 9:18 AM 05/24/2012

## 2012-05-24 NOTE — Discharge Summary (Signed)
See full note this am. cdm 

## 2012-05-24 NOTE — Discharge Summary (Signed)
Discharge Summary   Patient ID: IQRA ROTUNDO MRN: [id number], DOB/AGE: 12/18/1940 72 y.o. Admit date: 05/15/2012 D/C date:     05/24/2012  Primary Cardiologist: Clifton James  Primary Discharge Diagnoses:  1. Atrial fibrillation, newly recognized - not cardioverted this admission due to upcoming need for surgery and size of LA 2. Severe MR - pending surgery 3. Acute systolic heart failure - LV systolic dysfunction felt secondary to her MR 4. Hypotension, limiting med titration 5. Large R pleural effusion, improved with diuresis 6. Refusal of blood products due to religious beliefs  Secondary Discharge Diagnoses:  1. Goiter - TSH WNL this admission 2. GERD 3. HTN 4. Diverticulitis 5. Anxiety 6. HL   Hospital Course :Ms. Onley is a 72 y/o F with history of MVP, GERD, HLD, HTN, and AS. She was seen as a work-in visit on 05/15/12 for dyspnea, racing heart, and dizziness with increased DOE for several weeks. Several days prior she was found to have pneumonia by CXR and was started on antibiotics, aspirin, and bystolic. At her visit with Norma Fredrickson NP at our office, she was found to be in atrial fib with RVR. It was suspected this had been going on for several weeks. Her BP was stable. She was admitted for rate control with diltiazem and anticoagulation with heparin. Pulm was consulted for her possible pneumonia, and her antibiotics were changed then eventually discontinued Ultimately, however, it was eventually felt that her hypoxia was due to acute systolic CHF. 2D echocardiogram showed EF 40-45%, small pericardial effusion, MVP and severe mitral regurgitation. There was no AS. She had a large Rt lung free flowing effusion with collapsed / atx lung flap. She was placed on IV Lasix. Thoracentesis was considered, but she improved greatly with diuresis and rate control. TEE was performed to assess her MR, demonstrating no LAA clot, severe MVP & severe MR with 2 eccentric jets. Cardioversion  was not performed due to size of LA (severely enlarged) and need for cath/mitral surgery. CVTS was consulted who recommended mitral valve mitral valve repair (possible replacement) +/- maze procedure. Note that the patient is a member of Jehovah's witness faith and she was uncertain that she would want any type of blood products administered. (See his note from 05/22/12.) Iron supplements were recommended. Pre-op cardiac cath was performed demonstrating no angiographic evidence of systolic dysfunction. The patient's rates were difficult to control at times, requiring initiation of amiodarone. Xarelto was initiated for anticoagulation at a dose of 15mg  per day (her Cr Cl has varied from 49-56, and Dr. Clifton James wanted to err on cautious side). Her atrial fibrillation is not felt to be valvular in etiology per Dr. Clifton James. Today, she feels well without complaint. Her Lasix was decreased due to soft BP. She is not on ACEI/ARB due to this. She has ambulated without complication and feels ready to go home. Her diltiazem and metoprolol were adjusted this morning and HR is in the 90s-low 100s. Followup BP is 102/60 this afternoon. Dr. Clifton James has seen and examined her today and feels she is stable for discharge. She will follow up with Dr. Cornelius Moras 06/12/12 at 11:30am. In the meantime due to her CHF we will make her a transition of care patient for f/u within 1 week with our office. She will also be plugged into the anticoagulation clinic at that visit. Left message on our scheduling voicemail line. If she is to be on amiodarone long term, she will require periodic monitoring of organ systems at discretion  of primary cardiologist. Of note, blood sugar was 153 today, last check was 104. She may need evaluation for diabetes at next eval.  Discharge Vitals: Blood pressure 102/60, pulse 98, temperature 97.5 F (36.4 C), temperature source Oral, resp. rate 18, height 5\' 7"  (1.702 m), weight 128 lb 1.6 oz (58.106 kg), SpO2  100.00%.  Labs: Lab Results  Component Value Date   WBC 6.5 05/24/2012   HGB 12.1 05/24/2012   HCT 36.0 05/24/2012   MCV 88.7 05/24/2012   PLT 164 05/24/2012     Lab 05/24/12 0912  NA 136  K 4.3  CL 104  CO2 20  BUN 14  CREATININE 0.85  CALCIUM 10.2  PROT 6.7  BILITOT 0.6  ALKPHOS 97  ALT 50*  AST 37  GLUCOSE 153*    Diagnostic Studies/Procedures   Echocardiogram, TEE, and cardiac caths this admit - see reports  Dg Chest 2 View 05/21/2012  *RADIOLOGY REPORT*  Clinical Data: The defibrillation, heart failure and right pleural effusion.  CHEST - 2 VIEW  Comparison: 05/19/2012 and 05/16/2012.  Findings: Since the prior two view study on 05/16/2012, there clearly is improvement in aeration of the right lower lobe and further decrease in right pleural effusion with a small amount of pleural fluid remaining.  No pulmonary edema is identified.  There is stable cardiac enlargement.  IMPRESSION: Improved right lower lobe aeration and decrease in right pleural fluid with a small right pleural effusion remaining.   Original Report Authenticated By: Irish Lack, M.D.    X-ray Chest Pa And Lateral 05/16/2012  *RADIOLOGY REPORT*  Clinical Data: Cough and shortness of breath.  CHEST - 2 VIEW  Comparison: Chest radiograph performed 05/15/2012  Findings: There is worsening right basilar airspace opacification, with a mildly increased small right pleural effusion.  A small left pleural effusion is again seen.  Findings raise concern for pneumonia.  No pneumothorax is identified.  Mild vascular congestion is seen; the appearance is less typical for edema.  The cardiomediastinal silhouette is mildly enlarged; calcification is noted in the aortic arch.  No acute osseous abnormalities are identified.  IMPRESSION:  1.  Worsening right basilar airspace opacification, with a mildly increased small right pleural effusion.  Findings raise concern for pneumonia. 2.  Mild vascular congestion and mild cardiomegaly;  the appearance remains less typical for edema, though it is a possibility.  Small left pleural effusion also seen.   Original Report Authenticated By: Tonia Ghent, M.D.    Dg Chest Port 1 View 05/19/2012  *RADIOLOGY REPORT*  Clinical Data: Evaluate endotracheal tube position  PORTABLE CHEST - 1 VIEW  Comparison: Chest x-ray of 05/16/2012  Findings: Aeration of the lungs has improved.  There is still haziness at the right lung base most consistent with atelectasis and probable effusion.  There may also be a small left effusion present.  Cardiomegaly is stable.  Probable enlargement of the thyroid gland is again noted displacing the tracheal air shadow to the left of midline.  IMPRESSION: Improved aeration.  Probable basilar atelectasis and effusions right greater than left.   Original Report Authenticated By: Dwyane Dee, M.D.    Dg Chest Portable 1 View 05/15/2012  *RADIOLOGY REPORT*  Clinical Data: Cough, congestion and shortness of breath.  PORTABLE CHEST - 1 VIEW  Comparison: 04/19/2011 and CT chest 07/07/2010.  Findings: Patient is slightly rotated.  There is leftward deviation of the trachea secondary to marked right thyroid enlargement. Heart is at the upper limits of normal in size.  Mild bibasilar air space disease, most notable in the right middle lobe, and small bilateral pleural effusions.  IMPRESSION: Mild bibasilar air space disease, right greater than left, with small bilateral pleural effusions.   Original Report Authenticated By: Leanna Battles, M.D.     Discharge Medications   Current Discharge Medication List    START taking these medications   Details  amiodarone (PACERONE) 200 MG tablet Take 1 tablet (200 mg total) by mouth 2 (two) times daily. Qty: 60 tablet, Refills: 2   Comments: Please discuss this medicine with your cardiologist at your followup appointment. The dose may need to be adjusted.    diltiazem (CARDIZEM CD) 120 MG 24 hr capsule Take 1 capsule (120 mg total) by  mouth daily. Qty: 30 capsule, Refills: 6    furosemide (LASIX) 20 MG tablet Take 1 tablet (20 mg total) by mouth daily. Qty: 30 tablet, Refills: 6    iron polysaccharides (NU-IRON) 150 MG capsule Take 1 capsule (150 mg total) by mouth daily. Qty: 30 capsule, Refills: 1    metoprolol tartrate (LOPRESSOR) 25 MG tablet Take 1 tablet (25 mg total) by mouth 2 (two) times daily. Qty: 60 tablet, Refills: 6    potassium chloride SA (K-DUR,KLOR-CON) 20 MEQ tablet Take 1 tablet (20 mEq total) by mouth daily. Qty: 30 tablet, Refills: 6    Rivaroxaban (XARELTO) 15 MG TABS tablet Take 1 tablet (15 mg total) by mouth daily with supper. Qty: 30 tablet, Refills: 3      CONTINUE these medications which have NOT CHANGED   Details  acetaminophen (TYLENOL) 500 MG tablet Take 500 mg by mouth every 6 (six) hours as needed. For pain    ALPRAZolam (XANAX) 0.25 MG tablet Take 0.25 mg by mouth 3 (three) times daily as needed. For anxiety    Probiotic Product (PROBIOTIC DAILY PO) Take 1 capsule by mouth daily.    rosuvastatin (CRESTOR) 20 MG tablet Take 20 mg by mouth daily.      STOP taking these medications     aspirin 81 MG tablet Comments:  Reason for Stopping:       irbesartan (AVAPRO) 75 MG tablet Comments:  Reason for Stopping:       levofloxacin (LEVAQUIN) 500 MG tablet Comments:  Reason for Stopping:       nebivolol (BYSTOLIC) 10 MG tablet Comments:  Reason for Stopping:          Disposition   The patient will be discharged in stable condition to home. Discharge Orders    Future Appointments: Provider: Department: Dept Phone: Center:   05/26/2012 10:00 AM Kathleene Hazel, MD [facility] Main Office [place]) [phone] [id number]   06/12/2012 11:30 AM Purcell Nails, MD [facility] [phone] [id number]     Future Orders Please Complete By Expires   Diet - low sodium heart healthy      Increase activity slowly       Comments:   No driving until cleared by cardiologist. No lifting over 5 lbs for 1 week. No sexual activity for 1 week. Keep procedure site clean & dry. If you notice increased pain, swelling, bleeding or pus, call/return!  You may shower, but no soaking baths/hot tubs/pools for 1 week.   Discharge instructions      Comments:   Patients on amiodarone may require additional periodic monitoring of other organ systems, such as lungs, thyroid, eyes, and liver function. Please talk to your doctor at your follow-up appointments about what  monitoring may be necessary for you.  Your heart ultrasound showed weakness of the heart muscle this admission. This may make you more susceptible to weight gain from fluid retention, which can lead to symptoms that we call heart failure. For patients with this condition, we give them these special instructions:  1. Follow a low-salt diet and watch your fluid intake. In general, you should not be taking in more than 2 liters of fluid per day. Some patients are restricted to less than 1.5 liters. This includes sources of water in foods like soup. 2. Weigh yourself on the same scale at same time of day and keep a log. 3. Call your doctor: (Anytime you feel any of the following symptoms)  - 3-4 pound weight gain in 1-2 days or 2 pounds overnight  - Shortness of breath, with or without a dry hacking cough  - Swelling in the hands, feet or stomach  - If you have to sleep on extra pillows at night in order to breathe     Follow-up Information    Follow up with Purcell Nails, MD. (06/12/12 at 11:30am)    Contact information:   [address] [place] [address] [phone]       Follow up with Verne Carrow, MD. (Our office will call you for a followup appointment (your 05/26/12 appointment will be rescheduled for 1 week))    Contact information:   [address] [place] [address] [phone]            Duration of  Discharge Encounter: Greater than 30 minutes including physician and PA time.  Signed, Ronie Spies PA-C 05/24/2012, 4:15 PM

## 2012-05-24 NOTE — Progress Notes (Signed)
Patient was asked by nurse and tech yesterday evening around 1800 if we could walk her in the hallway and patient stated that she already walked in the room several times to open up her blinds and to and fro to the restroom. When nurse  encouraged to ambulate patient stated that she was fine and did not want to walk any further. Today encouraged patient once more to ambulate for this time nurse stated that MD would like her to specifically ambulate in the hallway to get a good picture of what heart rate does while walking and patient agreed.   Patient on room air and while walking from her room to the end of A hall to the end of B hall then back to room:  Oxygen saturation: 98-100%  Heart Rate: 115-120's with spikes of 130's that did not sustain.   Patient stated felt good to get out the room and stated that its better for her not to talk while walking for she would get just a little short of breath but not much. Dr. Clifton James entered room and spoke with patient to discuss care with patient and nurse. Currently patient in bed, states no pain or discomfort. Will continue to monitor to end of shift.

## 2012-05-25 ENCOUNTER — Telehealth: Payer: Self-pay | Admitting: Cardiovascular Disease

## 2012-05-25 NOTE — Telephone Encounter (Addendum)
Pt was

## 2012-05-26 ENCOUNTER — Ambulatory Visit: Payer: Medicare Other | Admitting: Cardiovascular Disease

## 2012-05-31 ENCOUNTER — Encounter: Payer: Self-pay | Admitting: Physician Assistant

## 2012-05-31 ENCOUNTER — Ambulatory Visit (INDEPENDENT_AMBULATORY_CARE_PROVIDER_SITE_OTHER): Payer: Medicare Other | Admitting: Physician Assistant

## 2012-05-31 ENCOUNTER — Ambulatory Visit (INDEPENDENT_AMBULATORY_CARE_PROVIDER_SITE_OTHER): Payer: Medicare Other | Admitting: *Deleted

## 2012-05-31 ENCOUNTER — Telehealth: Payer: Self-pay | Admitting: *Deleted

## 2012-05-31 VITALS — BP 118/76 | HR 101 | Ht 67.0 in | Wt 130.8 lb

## 2012-05-31 DIAGNOSIS — K5730 Diverticulosis of large intestine without perforation or abscess without bleeding: Secondary | ICD-10-CM

## 2012-05-31 DIAGNOSIS — I4891 Unspecified atrial fibrillation: Secondary | ICD-10-CM

## 2012-05-31 DIAGNOSIS — K921 Melena: Secondary | ICD-10-CM

## 2012-05-31 DIAGNOSIS — I5022 Chronic systolic (congestive) heart failure: Secondary | ICD-10-CM

## 2012-05-31 DIAGNOSIS — I340 Nonrheumatic mitral (valve) insufficiency: Secondary | ICD-10-CM

## 2012-05-31 DIAGNOSIS — R195 Other fecal abnormalities: Secondary | ICD-10-CM

## 2012-05-31 DIAGNOSIS — R0989 Other specified symptoms and signs involving the circulatory and respiratory systems: Secondary | ICD-10-CM

## 2012-05-31 DIAGNOSIS — I059 Rheumatic mitral valve disease, unspecified: Secondary | ICD-10-CM

## 2012-05-31 LAB — CBC WITH DIFFERENTIAL/PLATELET
Basophils Absolute: 0 10*3/uL (ref 0.0–0.1)
Basophils Relative: 0.3 % (ref 0.0–3.0)
Eosinophils Absolute: 0.1 10*3/uL (ref 0.0–0.7)
MCHC: 33.9 g/dL (ref 30.0–36.0)
MCV: 87.8 fl (ref 78.0–100.0)
Monocytes Absolute: 0.7 10*3/uL (ref 0.1–1.0)
Neutrophils Relative %: 72.1 % (ref 43.0–77.0)
RBC: 4.62 Mil/uL (ref 3.87–5.11)
RDW: 14 % (ref 11.5–14.6)

## 2012-05-31 LAB — BASIC METABOLIC PANEL
BUN: 14 mg/dL (ref 6–23)
CO2: 29 mEq/L (ref 19–32)
Chloride: 101 mEq/L (ref 96–112)
Creatinine, Ser: 1 mg/dL (ref 0.4–1.2)
Glucose, Bld: 92 mg/dL (ref 70–99)

## 2012-05-31 MED ORDER — METOPROLOL TARTRATE 25 MG PO TABS: 25.0000 mg | ORAL_TABLET | Freq: Three times a day (TID) | ORAL | Status: DC

## 2012-05-31 NOTE — Telephone Encounter (Signed)
Message copied by Tarri Fuller on Wed May 31, 2012  5:48 PM ------      Message from: [place], [place] T      Created: Wed May 31, 2012  5:26 PM       Potassium and kidney function look good.      Hgb normal.      Continue with current treatment plan.      Tereso Newcomer, PA-C  4:49 PM 03/09/2012

## 2012-05-31 NOTE — Telephone Encounter (Signed)
pt notified about lab results with verbal understanding  

## 2012-05-31 NOTE — Progress Notes (Signed)
[address] [place], [place]  [address] Phone: [phone], Fax:  [phone]  Date:  05/31/2012   ID:  [name] [name], DOB 07/24/1940, MRN [id number]  PCP:  Minda Meo, MD  Primary Cardiologist:  Dr. Verne Carrow     History of Present Illness: [name] [name] is a 72 y.o. female who returns for f/u after recent admission to the hospital for acute systolic CHF in the setting of severe MR.  She has a history of mitral valve prolapse, aortic stenosis, HTN, HL and GERD. She was admitted 1/20-1/29.  She was in atrial fibrillation with RVR. Echo 05/16/12: EF 40-45%, diffuse HK, severe prolapse of the mitral valve involving the anterior and posterior leaflets, severe MR, moderate LAE, mild RAE, small pericardial effusion. TEE 05/19/12: EF 45%, moderately calcified aorta, severe prolapse of the mitral valve involving the P2 scallop of the posterior leaflet, cannot exclude perforation of the leaflet between P2 and P3 segments, severe MR, severe LAE, mild RAE, no PFO. Patient was diuresed with IV Lasix due to systolic CHF. She was seen by [facility]. Mitral valve repair versus replacement plus/minus maze procedure was recommended. Patient was not cardioverted due to need for upcoming surgery as well as the size of her left atrium. Right and left heart cath 05/22/12: No CAD, EF 40%, PCWP 15, Fick CO 4.1.  She was placed on amiodarone for rate control. She was placed on low-dose Xarelto 15 mg daily for anticoagulation. She was not placed on ACE inhibitor due to 2 soft blood pressures. She has planned followup with Dr. Cornelius Moras to further discuss surgery.  Feeling better.  Dyspnea improved.  She is NYHA Class II-IIb.  No chest pain, orthopnea, PND, edema.  No syncope or near syncope.  Notes recent episode of abdominal cramping with associated loose stool described as black in color.  No BRBPR.  No recurrence since.  She has noted constipation since.  Has a hx of  diverticulosis/itis.  Of note, she is now on Fe Rx.  Labs (1/14):      K 4.3, creatinine 0.85, ALT 50, Hgb 12.1, TSH 0.652 Carotids 1/14:   No ICA stenosis bilaterally  Wt Readings from Last 3 Encounters:  05/31/12 130 lb 12.8 oz (59.33 kg)  05/24/12 128 lb 1.6 oz (58.106 kg)  05/24/12 128 lb 1.6 oz (58.106 kg)     Past Medical History  Diagnosis Date  . Hypertension     Then hypotension 04/2012 limiting med adjustment.  . Diverticulitis   . Anxiety   . Mitral valve prolapse     a. H/o MVP, with severe MR 04/2012, awaiting surgery.  . Hyperlipidemia   . Goiter   . GERD (gastroesophageal reflux disease)   . Aortic stenosis 05/19/2012    trivial by TEE  . PONV (postoperative nausea and vomiting)   . Atrial fibrillation     a. Dx 04/2012.  . Mitral regurgitation     a. Severe by TEE, pending surgery.  . Systolic CHF     a. EF 40% by studies 04/2012 (LV dysf felt 2/2 MR)  . Refusal of blood transfusions as patient is Jehov[employer]'s Witness     Current Outpatient Prescriptions  Medication Sig Dispense Refill  . acetaminophen (TYLENOL) 500 MG tablet Take 500 mg by mouth every 6 (six) hours as needed. For pain      . ALPRAZolam (XANAX) 0.25 MG tablet Take 0.25 mg by mouth 3 (three) times daily as needed. For anxiety      .  amiodarone (PACERONE) 200 MG tablet Take 1 tablet (200 mg total) by mouth 2 (two) times daily.  60 tablet  2  . diltiazem (CARDIZEM CD) 120 MG 24 hr capsule Take 1 capsule (120 mg total) by mouth daily.  30 capsule  6  . furosemide (LASIX) 20 MG tablet Take 1 tablet (20 mg total) by mouth daily.  30 tablet  6  . iron polysaccharides (NU-IRON) 150 MG capsule Take 1 capsule (150 mg total) by mouth daily.  30 capsule  1  . metoprolol tartrate (LOPRESSOR) 25 MG tablet Take 1 tablet (25 mg total) by mouth 2 (two) times daily.  60 tablet  6  . potassium chloride SA (K-DUR,KLOR-CON) 20 MEQ tablet Take 1 tablet (20 mEq total) by mouth daily.  30 tablet  6  . Probiotic Product  (PROBIOTIC DAILY PO) Take 1 capsule by mouth daily.      . Rivaroxaban (XARELTO) 15 MG TABS tablet Take 1 tablet (15 mg total) by mouth daily with supper.  30 tablet  3  . rosuvastatin (CRESTOR) 20 MG tablet Take 20 mg by mouth daily.        Allergies:    Allergies  Allergen Reactions  . Sulfa Drugs Cross Reactors Other (See Comments)    Unknown.  . Aspirin Rash  . Naproxen Sodium Swelling and Rash    Social History:  The patient  reports that she has never smoked. She has never used smokeless tobacco. She reports that she does not drink alcohol or use illicit drugs.   ROS:  Please see the history of present illness.   All other systems reviewed and negative.   PHYSICAL EXAM: VS:  BP 118/76  Pulse 101  Ht 5\' 7"  (1.702 m)  Wt 130 lb 12.8 oz (59.33 kg)  BMI 20.49 kg/m2 Well nourished, well developed, in no acute distress HEENT: normal Neck: no JVD Cardiac:  normal S1, S2; irregularly irregular rhythm; no murmur Lungs:  clear to auscultation bilaterally, no wheezing, rhonchi or rales Abd: soft, nontender, no hepatomegaly Ext: no edema; left groin without hematoma or bruit  Skin: warm and dry Neuro:  CNs 2-12 intact, no focal abnormalities noted  EKG:  AFib, HR 101     ASSESSMENT AND PLAN:  1. Chronic Systolic CHF:  Volume stable.  Continue current Rx. Check BMET today. 2. Black Stools:  Check CBC today.  Question if color noted related to Fe.  But, she has a hx of diverticulitis.  No toxic symptoms noted. Belly is soft and non-tender.  Since she is on Xarelto,  I have asked her to contact us if this occurs again.  Would recommend she see GI and have stool cards if recurs.   3. Atrial Fibrillation:  Rate with marginal control.  Increase Lopressor to 25 mg TID.  Continue Xarelto. 4. Mitral Regurgitation:  Follow up with Dr. Cornelius Moras soon to decide on timing of surgery. 5. Disposition:  Follow up with Dr. Verne Carrow in 1 month.  Luna Glasgow, PA-C  12:31 PM  05/31/2012

## 2012-05-31 NOTE — Progress Notes (Signed)
Pt was started on Xarelto  15mg  daily for Atrial Fib on  May 24, 2012.    Reviewed patients medication list.  Pt is not currently on any combined P-gp and strong CYP3A4 inhibitors/inducers (ketoconazole, traconazole, ritonavir, carbamazepine, phenytoin, rifampin, St. John's wort).  Reviewed labs.  SCr 1.0, Weight 59.33, CrCl-49.02.  Dose is appropriate  based on CrCl.   Hgb  13.8 and HCT 40.6  A full discussion of the nature of anticoagulants has been carried out.  A benefit/risk analysis has been presented to the patient, so that they understand the justification for choosing anticoagulation with Xarelto at this time.  The need for compliance is stressed.  Pt is aware to take the medication once daily with the largest meal of the day.  Side effects of potential bleeding are discussed, including unusual colored urine or stools, coughing up blood or coffee ground emesis, nose bleeds or serious fall or head trauma.  Discussed signs and symptoms of stroke. The patient should avoid any OTC items containing aspirin or ibuprofen.  Avoid alcohol consumption.   Call if any signs of abnormal bleeding.  Discussed financial obligations and resolved any difficulty in obtaining medication.  Next lab test in 1 month Pt may be scheduled for open heart surgery but  No surgery is scheduled at present Hospitalized  05/15/2012 to 05/24/2012  With  Atrial Fib  Severe  MR  Pending surgery  Acute Systolic  Heart Failure and Large right pleural  Effusion Pt called on 06/01/2012  and notified of her hgb and hct and of serum creatinine and creatinine clearance and that dose of Xarelto 15mg   Daily is the correct dose based on creatinine clearance. Also instructed if has a fall and should hit her head she should call MD and go to Er that could have bleed. Pt reminded to keep her appt in the clinic in one month and she states understanding.

## 2012-05-31 NOTE — Patient Instructions (Addendum)
INCREASE METOPROLOL 25 MG THREE TIMES DAILY; REFILL SENT IN TODAY  LAB TODAY; BMET, CBC W/DIFF  CALL IF YOUR PRIMARY CARE PHYSICIAN OR [facility], PAC IF YOU HAVE ANOTHER EPISODE OF BLACK STOOLS  PLEASE FOLLOW UP 1 MONTH WITH DR. Clifton James OR SC[facility] WEAVER, PAC SAME DAY DR. Clifton James IS IN THE OFFICE

## 2012-06-09 ENCOUNTER — Ambulatory Visit: Payer: Medicare Other | Admitting: Thoracic Surgery (Cardiothoracic Vascular Surgery)

## 2012-06-12 ENCOUNTER — Encounter: Payer: Self-pay | Admitting: Thoracic Surgery (Cardiothoracic Vascular Surgery)

## 2012-06-12 ENCOUNTER — Other Ambulatory Visit: Payer: Self-pay | Admitting: *Deleted

## 2012-06-12 ENCOUNTER — Ambulatory Visit (INDEPENDENT_AMBULATORY_CARE_PROVIDER_SITE_OTHER): Payer: Medicare Other | Admitting: Thoracic Surgery (Cardiothoracic Vascular Surgery)

## 2012-06-12 ENCOUNTER — Ambulatory Visit: Payer: Medicare Other | Admitting: Thoracic Surgery (Cardiothoracic Vascular Surgery)

## 2012-06-12 VITALS — BP 105/71 | HR 75 | Resp 18 | Ht 66.5 in | Wt 130.0 lb

## 2012-06-12 DIAGNOSIS — I4891 Unspecified atrial fibrillation: Secondary | ICD-10-CM

## 2012-06-12 DIAGNOSIS — I059 Rheumatic mitral valve disease, unspecified: Secondary | ICD-10-CM

## 2012-06-12 DIAGNOSIS — I340 Nonrheumatic mitral (valve) insufficiency: Secondary | ICD-10-CM

## 2012-06-12 DIAGNOSIS — Z531 Procedure and treatment not carried out because of patient's decision for reasons of belief and group pressure: Secondary | ICD-10-CM

## 2012-06-12 NOTE — Progress Notes (Signed)
[address]            [name] [address]          [phone]     CARDIOTHORACIC SURGERY OFFICE NOTE  Referring Provider is Verne Carrow D*MD PCP is ARONSON,RICHARD A, MD   HPI:  Patient returns for followup of severe symptomatic mitral regurgitation with persistent atrial fibrillation. She was originally seen in consultation during her hospitalization last month for congestive heart failure. Since hospital discharge she reports remaining quite stable clinically. She states that her breathing is much better than it was when she first went to the hospital. She is been careful not to exert herself much and she has not been short of breath with mild physical activity. She has not had any chest pains or chest tightness. She states that in the morning after she takes her blood pressure and rate control medications she feels tired and fatigued. She's had some mild dizziness when she first stands up but no prolonged episodes of dizziness nor syncope. She denies resting shortness of breath, PND, orthopnea, or lower extremity edema. She has continued palpitations. She otherwise reports no new problems or complaints.   Current Outpatient Prescriptions  Medication Sig Dispense Refill  . acetaminophen (TYLENOL) 500 MG tablet Take 500 mg by mouth every 6 (six) hours as needed. For pain      . ALPRAZolam (XANAX) 0.25 MG tablet Take 0.25 mg by mouth 3 (three) times daily as needed. For anxiety      . amiodarone (PACERONE) 200 MG tablet Take 1 tablet (200 mg total) by mouth 2 (two) times daily.  60 tablet  2  . diltiazem (CARDIZEM CD) 120 MG 24 hr capsule Take 1 capsule (120 mg total) by mouth daily.  30 capsule  6  . furosemide (LASIX) 20 MG tablet Take 1 tablet (20 mg total) by mouth daily.  30 tablet  6  . iron polysaccharides (NU-IRON) 150 MG capsule Take 1 capsule (150 mg total) by mouth daily.  30 capsule  1  . metoprolol tartrate (LOPRESSOR) 25 MG tablet  Take 1 tablet (25 mg total) by mouth 3 (three) times daily.  90 tablet  11  . potassium chloride SA (K-DUR,KLOR-CON) 20 MEQ tablet Take 1 tablet (20 mEq total) by mouth daily.  30 tablet  6  . Probiotic Product (PROBIOTIC DAILY PO) Take 1 capsule by mouth daily.      . Rivaroxaban (XARELTO) 15 MG TABS tablet Take 1 tablet (15 mg total) by mouth daily with supper.  30 tablet  3   No current facility-administered medications for this visit.      Physical Exam:   BP 105/71  Pulse 75  Resp 18  Ht 5' 6.5" (1.689 m)  Wt 130 lb (58.968 kg)  BMI 20.67 kg/m2  SpO2 98%  General:  Well-appearing  Chest:   Clear to auscultation with symmetrical breath sounds  CV:   Irregular rate and rhythm with prominent systolic murmur  Incisions:  n/a  Abdomen:  Soft and nontender  Extremities:  Warm and well-perfused with no lower extremity edema  Diagnostic Tests:  CBC on 2/5 notable for Hgb 13.8 with HCT 40.6%  Transthoracic Echocardiography  Patient: [name] [name], [name] [name] MR #: [id number] Study Date: 05/16/2012 Gender: F Age: 4 Height: 170.2cm Weight: 62.8kg BSA: 1.37m^2 Pt. Status: Room: 2601  ADMITTING Olga Millers ATTENDING Olga Millers PERFORMING  [place], [facility] [place], Lori SONOGRAPHER Melissa Morford, RDCS cc:  ------------------------------------------------------------ LV EF: 40% LV EF: 40% - 45%  ------------------------------------------------------------ Indications: Atrial fibrillation - 427.31.  ------------------------------------------------------------ History: PMH: Murmur. Mitral valve prolapse. Risk factors: Hypertension. Dyslipidemia.  ------------------------------------------------------------ Study Conclusions  - Left ventricle: The cavity size was mildly dilated. Wall thickness was normal. Systolic function was mildly to moderately reduced. The estimated ejection fraction was 40%, in the range of 40% to 45%. Diffuse hypokinesis.  The study is not technically sufficient to allow evaluation of LV diastolic function. Doppler parameters are consistent with high ventricular filling pressure. - Mitral valve: Severe prolapse, involving the anterior leaflet and the posterior leaflet. Severe regurgitation. - Left atrium: The atrium was moderately dilated. - Right atrium: The atrium was mildly dilated. - Pericardium, extracardiac: A small pericardial effusion was identified. Impressions:  - Markedly abnormal MV with thickening, bileaflet prolapse and probable severe MR (difficult to quantitate due to chaotic nature of jet); suggest TEE to further assess. Transthoracic echocardiography. M-mode, complete 2D, spectral Doppler, and color Doppler. Height: Height: 170.2cm. Height: 67in. Weight: Weight: 62.8kg. Weight: 138.2lb. Body mass index: BMI: 21.7kg/m^2. Body surface area: BSA: 1.58m^2. Blood pressure: 99/73. Patient status: Inpatient. Location: Echo laboratory.  ------------------------------------------------------------  ------------------------------------------------------------ Left ventricle: The cavity size was mildly dilated. Wall thickness was normal. Systolic function was mildly to moderately reduced. The estimated ejection fraction was 40%, in the range of 40% to 45%. Diffuse hypokinesis. The study is not technically sufficient to allow evaluation of LV diastolic function. Doppler parameters are consistent with high ventricular filling pressure.  ------------------------------------------------------------ Aortic valve: Trileaflet; mildly thickened leaflets. Mobility was not restricted. Doppler: Transvalvular velocity was within the normal range. There was no stenosis. No regurgitation.  ------------------------------------------------------------ Aorta: Aortic root: The aortic root was normal in size.  ------------------------------------------------------------ Mitral valve: Moderately thickened  leaflets . Mobility was not restricted. Severe prolapse, involving the anterior leaflet and the posterior leaflet. Doppler: Transvalvular velocity was within the normal range. There was no evidence for stenosis. Severe regurgitation. Peak gradient: 9mm Hg (D).  ------------------------------------------------------------ Left atrium: The atrium was moderately dilated.  ------------------------------------------------------------ Right ventricle: The cavity size was normal. Systolic function was normal.  ------------------------------------------------------------ Pulmonic valve: Doppler: Transvalvular velocity was within the normal range. There was no evidence for stenosis.  ------------------------------------------------------------ Tricuspid valve: Structurally normal valve. Doppler: Transvalvular velocity was within the normal range. Trivial regurgitation.  ------------------------------------------------------------ Pulmonary artery: Systolic pressure was within the normal range.  ------------------------------------------------------------ Right atrium: The atrium was mildly dilated.  ------------------------------------------------------------ Pericardium: A small pericardial effusion was identified.  ------------------------------------------------------------ Systemic veins: Inferior vena cava: The vessel was normal in size.  ------------------------------------------------------------  2D measurements Normal Doppler Normal Left ventricle measurements LVID ED, 55.4 mm 43-52 Main pulmonary chord, artery PLAX Pressure, S 28 mm =30 LVID ES, 44.3 mm 23-38 Hg chord, Left ventricle PLAX Ea, lat 9.65 cm/ ------- FS, chord, 20 % >29 ann, tiss s PLAX DP LVPW, ED 9.94 mm ------ E/Ea, lat 15.65 ------- IVS/LVPW 1 <1.3 ann, tiss ratio, ED DP Ventricular septum Ea, med 8.44 cm/ ------- IVS, ED 9.94 mm ------ ann, tiss s Aorta DP Root diam, 33 mm ------ E/Ea, med  17.89 ------- ED ann, tiss Left atrium DP AP dim 50 mm ------ Mitral valve AP dim 2.9 cm/m^2 <2.2 Peak E vel 151 cm/ ------- index s Peak A vel 36.7 cm/ ------- s Deceleratio 232 ms 150-230 n time Peak 9 mm ------- gradient, D Hg Peak E/A 4.1 ------- ratio Regurg 36.8 cm/ -------  alias vel, s PISA Max regurg 554 cm/ ------- vel s Regurg VTI 159 cm ------- ERO, PISA 0.5 cm^ ------- 2 Regurg vol, 80 ml ------- PISA Tricuspid valve Regurg peak 210 cm/ ------- vel s Peak RV-RA 18 mm ------- gradient, S Hg Systemic veins Estimated 10 mm ------- CVP Hg Right ventricle Pressure, S 28 mm <30 Hg Sa vel, lat 9.43 cm/ ------- ann, tiss s DP  ------------------------------------------------------------ Prepared and Electronically Authenticated by  Olga Millers [id number]:[id number]  TRANSESOPHAGEAL ECHOCARDIOGRAM  NAME: [name] [name] MRN: [id number]  DOB: 1940/11/16 ADMIT DATE: 05/15/2012  INDICATIONS: Symptomatic AF  PROCEDURE:  Informed consent was obtained prior to the procedure. The risks, benefits and alternatives for the procedure were discussed and the patient comprehended these risks. Risks include, but are not limited to, cough, sore throat, vomiting, nausea, somnolence, esophageal and stomach trauma or perforation, bleeding, low blood pressure, aspiration, pneumonia, infection, trauma to the teeth and death.  After a procedural time-out, the patient was given 5 mg versed and 75 mcg fentanyl for moderate sedation. The oropharynx was anesthetized 10 cc of topical 1% viscous lidocaine. The transesophageal probe was inserted in the esophagus and stomach without difficulty and multiple views were obtained.  FINDINGS:  LEFT VENTRICLE: EF = 45%. Global HK   RIGHT VENTRICLE: Mildly hypokinetic  LEFT ATRIUM: Severely enlarged  LEFT ATRIAL APPENDAGE: No clot  RIGHT ATRIUM: Mildly dialted  AORTIC VALVE: Trileaflet. Mildly calcified. No AI/AS  MITRAL VALVE: The  posterior leaflet is markedly abnormal. It is very thickened with severe prolapse particularly in the P2 region. There may be a cleft or perforation in that region as well. There is severe MR with 2 eccentric jets.  TRICUSPID VALVE: Normal. Mild Tr  PULMONIC VALVE: Not visualized  INTERATRIAL SEPTUM: No PFO or ASD  PERICARDIUM: No effusion  DESCENDING AORTA: Moderate to severe plaque.  Cardioversion not performed due to size of LA and need for cath and probably MV surgery.  Truman Hayward  10:09 AM    Cardiac Catheterization Operative Report  [name] [name]  [id number]  1/27/20141:00 PM  ARONSON,RICHARD A, MD  Procedure Performed:  1. Left Heart Catheterization 2. Selective Coronary Angiography 3. Right Heart Catheterization 4. Left ventricular angiogram 5. Distal Aortogram Operator: Verne Carrow, MD  Indication: 72 yo female with history of mitral valve prolapse admitted with atrial fibrillation with RVR, CHF and found to have LVEF 40%, severe MR . She has been seen by Dr. Cornelius Moras with CT surgery and plans are being made for mitral valve replacement.  Procedure Details:  The risks, benefits, complications, treatment options, and expected outcomes were discussed with the patient. The patient and/or family concurred with the proposed plan, giving informed consent. The patient was brought to the cath lab after IV hydration was begun and oral premedication was given. The patient was further sedated with Versed and Fentanyl. The left groin was prepped and draped in the usual manner. Using the modified Seldinger access technique, a 5 French sheath was placed in the left femoral artery. A 6 French sheath was inserted into the left femoral vein. A multi-purpose catheter was used to perform a right heart catheterization. Standard diagnostic catheters were used to perform selective coronary angiography. A pigtail catheter was used to perform a left ventricular angiogram as well as a  thoracic aortogram, abdominal aortogram with visualization of both iliac arteries. There were no immediate complications. The patient was taken to the recovery area in stable condition.  Hemodynamic Findings:  Ao: 81/57  LV: 81/22/34  RA: 5  RV: 23/2/5  PA: 23/12 (mean 17)  PCWP: 15  Fick Cardiac Output: 4.1 L/min  Fick Cardiac Index: 2.5 L/min/m2  Central Aortic Saturation: 92%  Pulmonary Artery Saturation: 65%  Angiographic Findings:  Left main: No obstructive disease.  Left Anterior Descending Artery: Moderate caliber vessel that does not reach the apex. There is a moderate caliber diagonal branch with no obstructive disease noted.  Circumflex Artery: Moderate caliber vessel with no obstructive disease noted. The first obtuse marginal branch is moderate in caliber and has no disease.  Right Coronary Artery: Large caliber dominant vessel with no obstructive disease noted.  Left Ventricular Angiogram: LVEF=40% with global hypokinesis.  Impression:  1. No angiographic evidence of CAD  2. Moderate global LV systolic dysfunction  3. Severe mitral regurgitation  4. Atrial fibrillation    Impression:  Patient has severe symptomatic mitral regurgitation with long-standing mitral valve prolapse involving flail segment of the posterior leaflet of the mitral valve, likely P3.  She was hospitalized last month with class IV congestive heart failure in the setting of persistent atrial fibrillation presumably of recent onset. Congestive heart failure has improved with diuretic therapy and rate control. Echocardiogram and catheterization is notable for moderate left ventricular dysfunction with ejection fraction estimated 40% despite the presence of severe mitral regurgitation. Catheterization is notable for the absence of significant coronary artery disease and pulmonary artery pressures were relatively low. Risks of surgery will be further increased because the patient is a member of the [employer] and she would not accept human blood products even in the event of life-threatening anemia or blood loss.   Plan:  I spent in excess of 60 minutes discussing the indications, risks, and potential benefits of surgery with the patient and her close friend, Ms. Marla Roe, here in the office today. The rationale for elective mitral valve repair surgery has been explained, including a comparison between surgery and continued medical therapy with close follow-up.  The likelihood of successful and durable valve repair has been discussed with particular reference to the findings of their recent echocardiogram.  Based upon these findings and previous experience, I have quoted them a greater than 90 percent likelihood of successful valve repair.  In the unlikely event that their valve cannot be successfully repaired, we discussed the possibility of replacing the mitral valve using a mechanical prosthesis with the attendant need for long-term anticoagulation versus the alternative of replacing it using a bioprosthetic tissue valve with its potential for late structural valve deterioration and failure, depending upon the patient's longevity.  The patient specifically requests that if the mitral valve must be replaced that it be done using a bioprosthetic tissue valve.  Alternative surgical approaches have been discussed, including a comparison between conventional sternotomy and minimally-invasive techniques.  The relative risks and benefits of each have been reviewed as they pertain to the patient's specific circumstances, and all of their questions have been addressed.  We also spent a considerable amount of time dosing the implications of her faith and associated beliefs regarding blood products and technical aspects of open heart surgery.  The patient specifically states that she would not except transfusion of human blood products even in the event of life-threatening anemia or acute blood loss. She  does wish that we use a Cell Saver device to scavenge all of her blood loss during the operation. She understands that during the conduct of open heart surgery her blood will be circulating through the  heart-lung machine. She accepts albumin and other blood volume expanders.  She has never previously heard of recombinant factor VII as a treatment for bleeding, but she think she would except that if necessary. She understands that the development of severe acute blood loss anemia could cause other complications including brain injury, acute kidney injury, pulmonary edema, respiratory failure, or other complications which might prolong her recovery.  All of her questions been addressed.  We plan to proceed with surgery on February 25.    Salvatore Decent. Cornelius Moras, MD 06/12/2012 6:30 PM

## 2012-06-12 NOTE — Patient Instructions (Signed)
Stop taking Xarelto (blood thinner). Continue all other medications through the day before surgery.  On the morning of surgery take only metoprolol and diltiazem (Cardizem) with a sip of water  Do not take any aspirin containing products.

## 2012-06-13 ENCOUNTER — Encounter (HOSPITAL_COMMUNITY): Payer: Self-pay | Admitting: Pharmacy Technician

## 2012-06-15 ENCOUNTER — Encounter (HOSPITAL_COMMUNITY)
Admission: RE | Admit: 2012-06-15 | Discharge: 2012-06-15 | Disposition: A | Discharge status: Home / Self Care | Payer: Medicare Other | Source: Ambulatory Visit | Attending: Thoracic Surgery (Cardiothoracic Vascular Surgery) | Admitting: Thoracic Surgery (Cardiothoracic Vascular Surgery)

## 2012-06-15 ENCOUNTER — Encounter (HOSPITAL_COMMUNITY): Payer: Self-pay

## 2012-06-15 ENCOUNTER — Telehealth: Payer: Self-pay | Admitting: *Deleted

## 2012-06-15 VITALS — BP 107/67 | HR 80 | Temp 97.5°F | Resp 20 | Ht 66.0 in | Wt 124.6 lb

## 2012-06-15 DIAGNOSIS — I059 Rheumatic mitral valve disease, unspecified: Secondary | ICD-10-CM

## 2012-06-15 HISTORY — DX: Shortness of breath: R06.02

## 2012-06-15 HISTORY — DX: Pneumonia, unspecified organism: J18.9

## 2012-06-15 LAB — CBC
Platelets: 194 10*3/uL (ref 150–400)
RBC: 4.56 MIL/uL (ref 3.87–5.11)
WBC: 8.2 10*3/uL (ref 4.0–10.5)

## 2012-06-15 LAB — COMPREHENSIVE METABOLIC PANEL
ALT: 20 U/L (ref 0–35)
AST: 26 U/L (ref 0–37)
CO2: 22 mEq/L (ref 19–32)
Chloride: 101 mEq/L (ref 96–112)
GFR calc non Af Amer: 61 mL/min — ABNORMAL LOW (ref >90)
Potassium: 4.4 mEq/L (ref 3.5–5.1)
Sodium: 135 mEq/L (ref 135–145)
Total Bilirubin: 0.7 mg/dL (ref 0.3–1.2)

## 2012-06-15 LAB — SURGICAL PCR SCREEN
MRSA, PCR: NEGATIVE
Staphylococcus aureus: NEGATIVE

## 2012-06-15 LAB — URINALYSIS, ROUTINE W REFLEX MICROSCOPIC
Glucose, UA: NEGATIVE mg/dL
Specific Gravity, Urine: 1.007 (ref 1.005–1.030)
Urobilinogen, UA: 0.2 mg/dL (ref 0.0–1.0)

## 2012-06-15 LAB — NO BLOOD PRODUCTS

## 2012-06-15 LAB — BLOOD GAS, ARTERIAL
Drawn by: 206361
FIO2: 0.21 %
pCO2 arterial: 31.2 mmHg — ABNORMAL LOW (ref 35.0–45.0)
pO2, Arterial: 95.4 mmHg (ref 80.0–100.0)

## 2012-06-15 LAB — PROTIME-INR: Prothrombin Time: 13.3 seconds (ref 11.6–15.2)

## 2012-06-15 LAB — APTT: aPTT: 33 seconds (ref 24–37)

## 2012-06-15 NOTE — Telephone Encounter (Signed)
Completed patients heart surgery education in short stay with her and her family member.  Answered all questions.  Book given.  Told to call me if any other concerns came up.

## 2012-06-15 NOTE — Progress Notes (Signed)
Call to [facility] at TCT, 2 times for discussion of pt. Review with Alycia Rossetti for purpose of pt. Education.

## 2012-06-15 NOTE — Pre-Procedure Instructions (Signed)
[name] [name]  06/15/2012   Your procedure is scheduled on:  06/20/2012  Report to [facility] at 5:30 AM.  Call this number if you have problems the morning of surgery: [phone]   Remember:   Do not eat food or drink liquids after midnight. On MONDAY   Take these medicines the morning of surgery with A SIP OF WATER: Metoprolol, Dilitazem    Do not wear jewelry, make-up or nail polish.  Do not wear lotions, powders, or perfumes. You may wear deodorant.  Do not shave 48 hours prior to surgery. Men may shave face and neck.  Do not bring valuables to the hospital.  Contacts, dentures or bridgework may not be worn into surgery.  Leave suitcase in the car. After surgery it may be brought to your room.  For patients admitted to the hospital, checkout time is 11:00 AM the day of  discharge.   Patients discharged the day of surgery will not be allowed to drive  home.  Name and phone number of your driver: /w daughter   Special Instructions: Shower using CHG 2 nights before surgery and the night before surgery.  If you shower the day of surgery use CHG.  Use special wash - you have one bottle of CHG for all showers.  You should use approximately 1/3 of the bottle for each shower.   Please read over the following fact sheets that you were given: Pain Booklet, Coughing and Deep Breathing, Blood Transfusion Information, Open Heart Packet, MRSA Information and Surgical Site Infection Prevention

## 2012-06-27 MED ORDER — VERAPAMIL HCL 2.5 MG/ML IV SOLN
INTRAVENOUS | Status: DC
  Filled 2012-06-27: qty 2.5

## 2012-06-27 MED ORDER — VANCOMYCIN HCL 10 G IV SOLR
1250.0000 mg | INTRAVENOUS | Status: AC
  Administered 2012-06-28: 1250 mg via INTRAVENOUS
  Administered 2012-06-28: 750 mg via INTRAVENOUS
  Filled 2012-06-27 (×2): qty 1250

## 2012-06-27 MED ORDER — VANCOMYCIN HCL 1000 MG IV SOLR
INTRAVENOUS | Status: AC
  Administered 2012-06-28: 14:00:00
  Filled 2012-06-27: qty 1000

## 2012-06-27 MED ORDER — DOPAMINE-DEXTROSE 3.2-5 MG/ML-% IV SOLN
2.0000 ug/kg/min | INTRAVENOUS | Status: DC
  Filled 2012-06-27: qty 250

## 2012-06-27 MED ORDER — DEXTROSE 5 % IV SOLN
1.5000 g | INTRAVENOUS | Status: AC
  Administered 2012-06-28: 1.5 g via INTRAVENOUS
  Filled 2012-06-27 (×2): qty 1.5

## 2012-06-27 MED ORDER — EPINEPHRINE HCL 1 MG/ML IJ SOLN
0.5000 ug/min | INTRAVENOUS | Status: DC
  Filled 2012-06-27: qty 4

## 2012-06-27 MED ORDER — NITROGLYCERIN IN D5W 200-5 MCG/ML-% IV SOLN
2.0000 ug/min | INTRAVENOUS | Status: AC
  Administered 2012-06-28: 10 ug/min via INTRAVENOUS
  Filled 2012-06-27: qty 250

## 2012-06-27 MED ORDER — POTASSIUM CHLORIDE 2 MEQ/ML IV SOLN
80.0000 meq | INTRAVENOUS | Status: DC
  Filled 2012-06-27: qty 40

## 2012-06-27 MED ORDER — SODIUM CHLORIDE 0.9 % IV SOLN
INTRAVENOUS | Status: AC
  Administered 2012-06-28: 60 mL/h via INTRAVENOUS
  Filled 2012-06-27: qty 40

## 2012-06-27 MED ORDER — PHENYLEPHRINE HCL 10 MG/ML IJ SOLN
30.0000 ug/min | INTRAVENOUS | Status: DC
  Administered 2012-06-28: 20 ug/min via INTRAVENOUS
  Filled 2012-06-27: qty 2

## 2012-06-27 MED ORDER — METOPROLOL TARTRATE 12.5 MG HALF TABLET: 12.5000 mg | ORAL_TABLET | Freq: Once | ORAL | Status: DC

## 2012-06-27 MED ORDER — SODIUM CHLORIDE 0.9 % IV SOLN
INTRAVENOUS | Status: AC
  Administered 2012-06-28: 1 [IU]/h via INTRAVENOUS
  Filled 2012-06-27: qty 1

## 2012-06-27 MED ORDER — DEXTROSE 5 % IV SOLN
750.0000 mg | INTRAVENOUS | Status: DC
  Filled 2012-06-27: qty 750

## 2012-06-27 MED ORDER — DEXMEDETOMIDINE HCL IN NACL 400 MCG/100ML IV SOLN
0.1000 ug/kg/h | INTRAVENOUS | Status: AC
  Administered 2012-06-28: 0.2 ug/kg/h via INTRAVENOUS
  Filled 2012-06-27: qty 100

## 2012-06-27 MED ORDER — MAGNESIUM SULFATE 50 % IJ SOLN
40.0000 meq | INTRAMUSCULAR | Status: DC
  Filled 2012-06-27: qty 10

## 2012-06-27 NOTE — Progress Notes (Signed)
Surgery date changed called with time to arrive:: 630 06/28/12

## 2012-06-28 ENCOUNTER — Inpatient Hospital Stay (HOSPITAL_COMMUNITY)
Admission: RE | Admit: 2012-06-28 | Discharge: 2012-07-06 | DRG: 220 | Disposition: A | Discharge status: Skilled Nursing Facility | Payer: Medicare Other | Source: Ambulatory Visit | Attending: Thoracic Surgery (Cardiothoracic Vascular Surgery) | Admitting: Thoracic Surgery (Cardiothoracic Vascular Surgery)

## 2012-06-28 ENCOUNTER — Inpatient Hospital Stay (HOSPITAL_COMMUNITY): Payer: Medicare Other

## 2012-06-28 ENCOUNTER — Encounter (HOSPITAL_COMMUNITY)
Admission: RE | Disposition: A | Discharge status: Skilled Nursing Facility | Payer: Self-pay | Source: Ambulatory Visit | Attending: Thoracic Surgery (Cardiothoracic Vascular Surgery)

## 2012-06-28 ENCOUNTER — Inpatient Hospital Stay (HOSPITAL_COMMUNITY): Payer: Medicare Other | Admitting: *Deleted

## 2012-06-28 ENCOUNTER — Encounter (HOSPITAL_COMMUNITY): Payer: Self-pay | Admitting: *Deleted

## 2012-06-28 ENCOUNTER — Encounter (HOSPITAL_COMMUNITY): Payer: Self-pay

## 2012-06-28 DIAGNOSIS — E876 Hypokalemia: Secondary | ICD-10-CM | POA: Diagnosis not present

## 2012-06-28 DIAGNOSIS — R5381 Other malaise: Secondary | ICD-10-CM | POA: Diagnosis not present

## 2012-06-28 DIAGNOSIS — Z9889 Other specified postprocedural states: Secondary | ICD-10-CM

## 2012-06-28 DIAGNOSIS — D6959 Other secondary thrombocytopenia: Secondary | ICD-10-CM | POA: Diagnosis not present

## 2012-06-28 DIAGNOSIS — Z01812 Encounter for preprocedural laboratory examination: Secondary | ICD-10-CM

## 2012-06-28 DIAGNOSIS — Z01818 Encounter for other preprocedural examination: Secondary | ICD-10-CM

## 2012-06-28 DIAGNOSIS — N189 Chronic kidney disease, unspecified: Secondary | ICD-10-CM | POA: Diagnosis present

## 2012-06-28 DIAGNOSIS — I4891 Unspecified atrial fibrillation: Secondary | ICD-10-CM

## 2012-06-28 DIAGNOSIS — F411 Generalized anxiety disorder: Secondary | ICD-10-CM | POA: Diagnosis present

## 2012-06-28 DIAGNOSIS — I509 Heart failure, unspecified: Secondary | ICD-10-CM | POA: Diagnosis present

## 2012-06-28 DIAGNOSIS — Z7901 Long term (current) use of anticoagulants: Secondary | ICD-10-CM

## 2012-06-28 DIAGNOSIS — I059 Rheumatic mitral valve disease, unspecified: Secondary | ICD-10-CM

## 2012-06-28 DIAGNOSIS — Z8679 Personal history of other diseases of the circulatory system: Secondary | ICD-10-CM

## 2012-06-28 DIAGNOSIS — Z79899 Other long term (current) drug therapy: Secondary | ICD-10-CM

## 2012-06-28 DIAGNOSIS — K219 Gastro-esophageal reflux disease without esophagitis: Secondary | ICD-10-CM | POA: Diagnosis present

## 2012-06-28 DIAGNOSIS — I5032 Chronic diastolic (congestive) heart failure: Secondary | ICD-10-CM | POA: Diagnosis present

## 2012-06-28 DIAGNOSIS — F419 Anxiety disorder, unspecified: Secondary | ICD-10-CM | POA: Diagnosis present

## 2012-06-28 DIAGNOSIS — Z531 Procedure and treatment not carried out because of patient's decision for reasons of belief and group pressure: Secondary | ICD-10-CM

## 2012-06-28 DIAGNOSIS — F05 Delirium due to known physiological condition: Secondary | ICD-10-CM | POA: Diagnosis not present

## 2012-06-28 DIAGNOSIS — E8779 Other fluid overload: Secondary | ICD-10-CM | POA: Diagnosis not present

## 2012-06-28 DIAGNOSIS — Z01810 Encounter for preprocedural cardiovascular examination: Secondary | ICD-10-CM

## 2012-06-28 DIAGNOSIS — I129 Hypertensive chronic kidney disease with stage 1 through stage 4 chronic kidney disease, or unspecified chronic kidney disease: Secondary | ICD-10-CM | POA: Diagnosis present

## 2012-06-28 DIAGNOSIS — D62 Acute posthemorrhagic anemia: Secondary | ICD-10-CM | POA: Diagnosis not present

## 2012-06-28 HISTORY — DX: Personal history of other diseases of the circulatory system: Z86.79

## 2012-06-28 HISTORY — PX: MITRAL VALVE REPAIR: SHX2039

## 2012-06-28 HISTORY — PX: MINIMALLY INVASIVE MAZE PROCEDURE: SHX6244

## 2012-06-28 HISTORY — DX: Other specified postprocedural states: Z98.890

## 2012-06-28 HISTORY — PX: INTRAOPERATIVE TRANSESOPHAGEAL ECHOCARDIOGRAM: SHX5062

## 2012-06-28 LAB — POCT I-STAT 4, (NA,K, GLUC, HGB,HCT)
Glucose, Bld: 110 mg/dL — ABNORMAL HIGH (ref 70–99)
Glucose, Bld: 114 mg/dL — ABNORMAL HIGH (ref 70–99)
Glucose, Bld: 130 mg/dL — ABNORMAL HIGH (ref 70–99)
Glucose, Bld: 118 mg/dL — ABNORMAL HIGH (ref 70–99)
HCT: 26 % — ABNORMAL LOW (ref 36.0–46.0)
HCT: 32 % — ABNORMAL LOW (ref 36.0–46.0)
HCT: 23 % — ABNORMAL LOW (ref 36.0–46.0)
HCT: 23 % — ABNORMAL LOW (ref 36.0–46.0)
Hemoglobin: 8.8 g/dL — ABNORMAL LOW (ref 12.0–15.0)
Hemoglobin: 7.8 g/dL — ABNORMAL LOW (ref 12.0–15.0)
Potassium: 4.4 mEq/L (ref 3.5–5.1)
Potassium: 4.3 mEq/L (ref 3.5–5.1)
Sodium: 139 mEq/L (ref 135–145)
Sodium: 138 mEq/L (ref 135–145)
Sodium: 139 mEq/L (ref 135–145)
Sodium: 138 mEq/L (ref 135–145)

## 2012-06-28 LAB — POCT I-STAT 3, ART BLOOD GAS (G3+)
Acid-Base Excess: 3 mmol/L — ABNORMAL HIGH (ref 0.0–2.0)
Acid-base deficit: 6 mmol/L — ABNORMAL HIGH (ref 0.0–2.0)
Bicarbonate: 23.7 mEq/L (ref 20.0–24.0)
Bicarbonate: 19.3 mEq/L — ABNORMAL LOW (ref 20.0–24.0)
O2 Saturation: 100 %
TCO2: 26 mmol/L (ref 0–100)
TCO2: 25 mmol/L (ref 0–100)
pCO2 arterial: 34.7 mmHg — ABNORMAL LOW (ref 35.0–45.0)
pCO2 arterial: 37.5 mmHg (ref 35.0–45.0)
pH, Arterial: 7.499 — ABNORMAL HIGH (ref 7.350–7.450)
pH, Arterial: 7.389 (ref 7.350–7.450)
pH, Arterial: 7.443 (ref 7.350–7.450)
pO2, Arterial: 598 mmHg — ABNORMAL HIGH (ref 80.0–100.0)

## 2012-06-28 LAB — CBC
HCT: 28 % — ABNORMAL LOW (ref 36.0–46.0)
HCT: 31.1 % — ABNORMAL LOW (ref 36.0–46.0)
Hemoglobin: 9.9 g/dL — ABNORMAL LOW (ref 12.0–15.0)
MCH: 29.5 pg (ref 26.0–34.0)
MCHC: 35.4 g/dL (ref 30.0–36.0)
MCHC: 34.7 g/dL (ref 30.0–36.0)
MCV: 83.3 fL (ref 78.0–100.0)
MCV: 85.2 fL (ref 78.0–100.0)
RDW: 14.2 % (ref 11.5–15.5)

## 2012-06-28 LAB — CREATININE, SERUM: Creatinine, Ser: 0.78 mg/dL (ref 0.50–1.10)

## 2012-06-28 LAB — HEMOGLOBIN AND HEMATOCRIT, BLOOD: Hemoglobin: 7.5 g/dL — ABNORMAL LOW (ref 12.0–15.0)

## 2012-06-28 LAB — GLUCOSE, CAPILLARY
Glucose-Capillary: 89 mg/dL (ref 70–99)
Glucose-Capillary: 98 mg/dL (ref 70–99)

## 2012-06-28 SURGERY — REPAIR, MITRAL VALVE, MINIMALLY INVASIVE
Anesthesia: General | Site: Chest | Laterality: Right | Wound class: Clean

## 2012-06-28 MED ORDER — SODIUM CHLORIDE 0.9 % IV SOLN
INTRAVENOUS | Status: DC
  Administered 2012-06-28: 0.8 [IU]/h via INTRAVENOUS
  Filled 2012-06-28: qty 1

## 2012-06-28 MED ORDER — ACETAMINOPHEN 10 MG/ML IV SOLN
1000.0000 mg | Freq: Once | INTRAVENOUS | Status: AC
  Administered 2012-06-28: 1000 mg via INTRAVENOUS
  Filled 2012-06-28: qty 100

## 2012-06-28 MED ORDER — METOPROLOL TARTRATE 25 MG/10 ML ORAL SUSPENSION
12.5000 mg | Freq: Two times a day (BID) | ORAL | Status: DC
  Filled 2012-06-28 (×3): qty 5

## 2012-06-28 MED ORDER — ROCURONIUM BROMIDE 100 MG/10ML IV SOLN
INTRAVENOUS | Status: DC | PRN
  Administered 2012-06-28: 50 mg via INTRAVENOUS

## 2012-06-28 MED ORDER — LACTATED RINGERS IV SOLN: 500.0000 mL | Freq: Once | INTRAVENOUS | Status: AC | PRN

## 2012-06-28 MED ORDER — SODIUM CHLORIDE 0.9 % IV SOLN: 250.0000 mL | INTRAVENOUS | Status: DC

## 2012-06-28 MED ORDER — VANCOMYCIN HCL IN DEXTROSE 1-5 GM/200ML-% IV SOLN
1000.0000 mg | Freq: Once | INTRAVENOUS | Status: AC
  Administered 2012-06-29: 1000 mg via INTRAVENOUS
  Filled 2012-06-28: qty 200

## 2012-06-28 MED ORDER — VECURONIUM BROMIDE 10 MG IV SOLR
INTRAVENOUS | Status: DC | PRN
  Administered 2012-06-28 (×3): 5 mg via INTRAVENOUS

## 2012-06-28 MED ORDER — ALBUMIN HUMAN 5 % IV SOLN
250.0000 mL | INTRAVENOUS | Status: AC | PRN
  Administered 2012-06-28 – 2012-06-29 (×2): 250 mL via INTRAVENOUS

## 2012-06-28 MED ORDER — BUPIVACAINE 0.5 % ON-Q PUMP SINGLE CATH 400 ML
400.0000 mL | INJECTION | Status: DC
  Filled 2012-06-28: qty 400

## 2012-06-28 MED ORDER — INSULIN ASPART 100 UNIT/ML ~~LOC~~ SOLN
0.0000 [IU] | SUBCUTANEOUS | Status: DC
  Administered 2012-06-30: 2 [IU] via SUBCUTANEOUS

## 2012-06-28 MED ORDER — SODIUM BICARBONATE 8.4 % IV SOLN
INTRAVENOUS | Status: DC | PRN
  Administered 2012-06-28: 50 meq via INTRAVENOUS

## 2012-06-28 MED ORDER — LACTATED RINGERS IV SOLN
INTRAVENOUS | Status: DC | PRN
  Administered 2012-06-28: 08:00:00 via INTRAVENOUS

## 2012-06-28 MED ORDER — FAMOTIDINE IN NACL 20-0.9 MG/50ML-% IV SOLN
20.0000 mg | Freq: Two times a day (BID) | INTRAVENOUS | Status: DC
  Administered 2012-06-28: 20 mg via INTRAVENOUS

## 2012-06-28 MED ORDER — STERILE WATER FOR IRRIGATION IR SOLN
Status: DC | PRN
  Administered 2012-06-28: 1000 mL

## 2012-06-28 MED ORDER — FENTANYL CITRATE 0.05 MG/ML IJ SOLN
INTRAMUSCULAR | Status: DC | PRN
  Administered 2012-06-28: 50 ug via INTRAVENOUS
  Administered 2012-06-28: 250 ug via INTRAVENOUS
  Administered 2012-06-28 (×4): 50 ug via INTRAVENOUS
  Administered 2012-06-28: 250 ug via INTRAVENOUS
  Administered 2012-06-28 (×4): 50 ug via INTRAVENOUS
  Administered 2012-06-28: 500 ug via INTRAVENOUS
  Administered 2012-06-28: 50 ug via INTRAVENOUS

## 2012-06-28 MED ORDER — LACTATED RINGERS IV SOLN
INTRAVENOUS | Status: DC
  Administered 2012-06-28: 20 mL via INTRAVENOUS

## 2012-06-28 MED ORDER — POTASSIUM CHLORIDE 10 MEQ/50ML IV SOLN: 10.0000 meq | INTRAVENOUS | Status: AC

## 2012-06-28 MED ORDER — SODIUM CHLORIDE 0.45 % IV SOLN
INTRAVENOUS | Status: DC
  Administered 2012-06-28: 20 mL/h via INTRAVENOUS

## 2012-06-28 MED ORDER — PANTOPRAZOLE SODIUM 40 MG PO TBEC
40.0000 mg | DELAYED_RELEASE_TABLET | Freq: Every day | ORAL | Status: DC
  Administered 2012-06-30 – 2012-07-06 (×7): 40 mg via ORAL
  Filled 2012-06-28 (×7): qty 1

## 2012-06-28 MED ORDER — NITROGLYCERIN IN D5W 200-5 MCG/ML-% IV SOLN: 0.0000 ug/min | INTRAVENOUS | Status: DC

## 2012-06-28 MED ORDER — HEPARIN SODIUM (PORCINE) 1000 UNIT/ML IJ SOLN
INTRAMUSCULAR | Status: DC | PRN
  Administered 2012-06-28: 15000 [IU] via INTRAVENOUS

## 2012-06-28 MED ORDER — METOPROLOL TARTRATE 1 MG/ML IV SOLN: 2.5000 mg | INTRAVENOUS | Status: DC | PRN

## 2012-06-28 MED ORDER — LACTATED RINGERS IV SOLN
INTRAVENOUS | Status: DC | PRN
  Administered 2012-06-28 (×2): via INTRAVENOUS

## 2012-06-28 MED ORDER — LACTATED RINGERS IV SOLN
INTRAVENOUS | Status: DC | PRN
  Administered 2012-06-28 (×3): via INTRAVENOUS

## 2012-06-28 MED ORDER — MAGNESIUM SULFATE 40 MG/ML IJ SOLN
INTRAMUSCULAR | Status: AC
  Filled 2012-06-28: qty 100

## 2012-06-28 MED ORDER — SODIUM CHLORIDE 0.9 % IV SOLN
1.0000 g/h | INTRAVENOUS | Status: DC
  Filled 2012-06-28: qty 20

## 2012-06-28 MED ORDER — CALCIUM CHLORIDE 10 % IV SOLN
1.0000 g | Freq: Once | INTRAVENOUS | Status: AC | PRN
  Filled 2012-06-28: qty 10

## 2012-06-28 MED ORDER — ONDANSETRON HCL 4 MG/2ML IJ SOLN
4.0000 mg | Freq: Four times a day (QID) | INTRAMUSCULAR | Status: DC | PRN
  Administered 2012-06-28 – 2012-07-05 (×3): 4 mg via INTRAVENOUS
  Filled 2012-06-28 (×3): qty 2

## 2012-06-28 MED ORDER — SODIUM CHLORIDE 0.9 % IJ SOLN
3.0000 mL | Freq: Two times a day (BID) | INTRAMUSCULAR | Status: DC
  Administered 2012-06-29 – 2012-07-04 (×9): 3 mL via INTRAVENOUS
  Administered 2012-07-05: 20:00:00 via INTRAVENOUS

## 2012-06-28 MED ORDER — ACETAMINOPHEN 160 MG/5ML PO SOLN: 975.0000 mg | Freq: Four times a day (QID) | ORAL | Status: DC

## 2012-06-28 MED ORDER — INSULIN ASPART 100 UNIT/ML ~~LOC~~ SOLN
0.0000 [IU] | SUBCUTANEOUS | Status: AC
  Administered 2012-06-28 – 2012-06-29 (×2): 2 [IU] via SUBCUTANEOUS

## 2012-06-28 MED ORDER — BISACODYL 5 MG PO TBEC
10.0000 mg | DELAYED_RELEASE_TABLET | Freq: Every day | ORAL | Status: DC
  Administered 2012-06-30 – 2012-07-02 (×3): 10 mg via ORAL
  Filled 2012-06-28 (×3): qty 2

## 2012-06-28 MED ORDER — MIDAZOLAM HCL 5 MG/5ML IJ SOLN
INTRAMUSCULAR | Status: DC | PRN
  Administered 2012-06-28 (×3): 1 mg via INTRAVENOUS
  Administered 2012-06-28 (×2): 2 mg via INTRAVENOUS
  Administered 2012-06-28: 3 mg via INTRAVENOUS

## 2012-06-28 MED ORDER — MORPHINE SULFATE 2 MG/ML IJ SOLN
2.0000 mg | INTRAMUSCULAR | Status: DC | PRN
  Filled 2012-06-28: qty 1

## 2012-06-28 MED ORDER — GLUTARALDEHYDE 0.625% SOAKING SOLUTION
Freq: Once | TOPICAL | Status: DC
  Filled 2012-06-28: qty 50

## 2012-06-28 MED ORDER — METOPROLOL TARTRATE 12.5 MG HALF TABLET
12.5000 mg | ORAL_TABLET | Freq: Two times a day (BID) | ORAL | Status: DC
  Filled 2012-06-28 (×3): qty 1

## 2012-06-28 MED ORDER — LACTATED RINGERS IV SOLN
INTRAVENOUS | Status: DC | PRN
  Administered 2012-06-28 (×2): via INTRAVENOUS

## 2012-06-28 MED ORDER — BISACODYL 10 MG RE SUPP: 10.0000 mg | Freq: Every day | RECTAL | Status: DC

## 2012-06-28 MED ORDER — DOCUSATE SODIUM 100 MG PO CAPS
200.0000 mg | ORAL_CAPSULE | Freq: Every day | ORAL | Status: DC
  Administered 2012-06-30 – 2012-07-04 (×5): 200 mg via ORAL
  Filled 2012-06-28 (×7): qty 2

## 2012-06-28 MED ORDER — MAGNESIUM SULFATE 40 MG/ML IJ SOLN
4.0000 g | Freq: Once | INTRAMUSCULAR | Status: AC
  Administered 2012-06-28: 4 g via INTRAVENOUS

## 2012-06-28 MED ORDER — DEXMEDETOMIDINE HCL IN NACL 200 MCG/50ML IV SOLN
0.1000 ug/kg/h | INTRAVENOUS | Status: DC
  Filled 2012-06-28: qty 50

## 2012-06-28 MED ORDER — INSULIN REGULAR BOLUS VIA INFUSION
0.0000 [IU] | Freq: Three times a day (TID) | INTRAVENOUS | Status: DC
  Filled 2012-06-28: qty 10

## 2012-06-28 MED ORDER — SODIUM CHLORIDE 0.9 % IV SOLN
INTRAVENOUS | Status: DC
  Administered 2012-06-28 – 2012-07-01 (×3): via INTRAVENOUS

## 2012-06-28 MED ORDER — PROPOFOL 10 MG/ML IV BOLUS
INTRAVENOUS | Status: DC | PRN
  Administered 2012-06-28: 120 mg via INTRAVENOUS

## 2012-06-28 MED ORDER — OXYCODONE HCL 5 MG PO TABS: 5.0000 mg | ORAL_TABLET | ORAL | Status: DC | PRN

## 2012-06-28 MED ORDER — SODIUM CHLORIDE 0.9 % IJ SOLN: 3.0000 mL | INTRAMUSCULAR | Status: DC | PRN

## 2012-06-28 MED ORDER — SUCCINYLCHOLINE CHLORIDE 20 MG/ML IJ SOLN
INTRAMUSCULAR | Status: DC | PRN
  Administered 2012-06-28: 100 mg via INTRAVENOUS

## 2012-06-28 MED ORDER — SODIUM CHLORIDE 0.9 % IR SOLN
Status: DC | PRN
  Administered 2012-06-28 (×2): 3000 mL

## 2012-06-28 MED ORDER — MIDAZOLAM HCL 2 MG/2ML IJ SOLN: 2.0000 mg | INTRAMUSCULAR | Status: DC | PRN

## 2012-06-28 MED ORDER — DEXTROSE 5 % IV SOLN
1.5000 g | Freq: Two times a day (BID) | INTRAVENOUS | Status: AC
  Administered 2012-06-28 – 2012-06-30 (×4): 1.5 g via INTRAVENOUS
  Filled 2012-06-28 (×4): qty 1.5

## 2012-06-28 MED ORDER — PHENYLEPHRINE HCL 10 MG/ML IJ SOLN
0.0000 ug/min | INTRAMUSCULAR | Status: DC
  Administered 2012-06-28: 0 ug/min via INTRAVENOUS
  Filled 2012-06-28 (×2): qty 2

## 2012-06-28 MED ORDER — PHENYLEPHRINE HCL 10 MG/ML IJ SOLN
10.0000 mg | INTRAVENOUS | Status: DC | PRN
  Administered 2012-06-28: 10 ug/min via INTRAVENOUS

## 2012-06-28 MED ORDER — PROTAMINE SULFATE 10 MG/ML IV SOLN
INTRAVENOUS | Status: DC | PRN
  Administered 2012-06-28: 150 mg via INTRAVENOUS

## 2012-06-28 MED ORDER — ACETAMINOPHEN 500 MG PO TABS
1000.0000 mg | ORAL_TABLET | Freq: Four times a day (QID) | ORAL | Status: DC
  Administered 2012-06-30 – 2012-07-01 (×5): 1000 mg via ORAL
  Administered 2012-07-01: 500 mg via ORAL
  Administered 2012-07-02 – 2012-07-03 (×4): 1000 mg via ORAL
  Filled 2012-06-28 (×20): qty 2

## 2012-06-28 MED ORDER — MORPHINE SULFATE 2 MG/ML IJ SOLN: 1.0000 mg | INTRAMUSCULAR | Status: AC | PRN

## 2012-06-28 MED ORDER — CHLORHEXIDINE GLUCONATE 4 % EX LIQD: 30.0000 mL | CUTANEOUS | Status: DC

## 2012-06-28 MED ORDER — 0.9 % SODIUM CHLORIDE (POUR BTL) OPTIME
TOPICAL | Status: DC | PRN
  Administered 2012-06-28: 6000 mL

## 2012-06-28 SURGICAL SUPPLY — 132 items
ADAPTER CARDIO PERF ANTE/RETRO (ADAPTER) ×3 IMPLANT
ADH SKN CLS APL DERMABOND .7 (GAUZE/BANDAGES/DRESSINGS) ×4
ADPR PRFSN 84XANTGRD RTRGD (ADAPTER) ×2
APL SKNCLS STERI-STRIP NONHPOA (GAUZE/BANDAGES/DRESSINGS) ×1
ATTRACTOMAT 16X20 MAGNETIC DRP (DRAPES) ×3 IMPLANT
BAG DECANTER FOR FLEXI CONT (MISCELLANEOUS) ×3 IMPLANT
BENZOIN TINCTURE PRP APPL 2/3 (GAUZE/BANDAGES/DRESSINGS) ×3 IMPLANT
BLADE SURG 11 STRL SS (BLADE) ×3 IMPLANT
CANISTER SUCTION 2500CC (MISCELLANEOUS) ×6 IMPLANT
CANNULA FEM VENOUS REMOTE 22FR (CANNULA) ×3 IMPLANT
CANNULA FEMORAL ART 14 SM (MISCELLANEOUS) ×3 IMPLANT
CANNULA GUNDRY RCSP 15FR (MISCELLANEOUS) ×3 IMPLANT
CANNULA OPTISITE PERFUSION 16F (CANNULA) ×3 IMPLANT
CANNULA OPTISITE PERFUSION 18F (CANNULA) IMPLANT
CARDIAC SUCTION (MISCELLANEOUS) ×3 IMPLANT
CARDIOBLATE CARDIAC ABLATION (MISCELLANEOUS)
CATH KIT ON Q 5IN DUAL SLV (PAIN MANAGEMENT) ×3 IMPLANT
CATH KIT ON Q 5IN SLV (PAIN MANAGEMENT) IMPLANT
CLOTH BEACON ORANGE TIMEOUT ST (SAFETY) ×3 IMPLANT
CONN ST 1/4X3/8  BEN (MISCELLANEOUS) ×2
CONN ST 1/4X3/8 BEN (MISCELLANEOUS) ×4 IMPLANT
CONT SPEC 4OZ CLIKSEAL STRL BL (MISCELLANEOUS) ×3 IMPLANT
CONT SPEC STER OR (MISCELLANEOUS) ×3 IMPLANT
COVER MAYO STAND STRL (DRAPES) ×3 IMPLANT
COVER PROBE W GEL 5X96 (DRAPES) ×3 IMPLANT
COVER SURGICAL LIGHT HANDLE (MISCELLANEOUS) ×6 IMPLANT
CRADLE DONUT ADULT HEAD (MISCELLANEOUS) ×3 IMPLANT
DERMABOND ADVANCED (GAUZE/BANDAGES/DRESSINGS) ×2
DERMABOND ADVANCED .7 DNX12 (GAUZE/BANDAGES/DRESSINGS) ×4 IMPLANT
DEVICE CARDIOBLATE CARDIAC ABL (MISCELLANEOUS) IMPLANT
DEVICE PMI PUNCTURE CLOSURE (MISCELLANEOUS) ×3 IMPLANT
DEVICE TROCAR PUNCTURE CLOSURE (ENDOMECHANICALS) ×3 IMPLANT
DRAIN CHANNEL 28F RND 3/8 FF (WOUND CARE) ×6 IMPLANT
DRAPE BILATERAL SPLIT (DRAPES) ×3 IMPLANT
DRAPE C-ARM 42X72 X-RAY (DRAPES) ×3 IMPLANT
DRAPE CV SPLIT W-CLR ANES SCRN (DRAPES) ×3 IMPLANT
DRAPE INCISE IOBAN 66X45 STRL (DRAPES) ×9 IMPLANT
DRAPE SLUSH MACHINE 52X66 (DRAPES) IMPLANT
DRAPE SLUSH/WARMER DISC (DRAPES) ×3 IMPLANT
DRSG COVADERM 4X8 (GAUZE/BANDAGES/DRESSINGS) ×3 IMPLANT
ELECT BLADE 6.5 EXT (BLADE) ×3 IMPLANT
ELECT REM PT RETURN 9FT ADLT (ELECTROSURGICAL) ×6
ELECTRODE REM PT RTRN 9FT ADLT (ELECTROSURGICAL) ×4 IMPLANT
FEMORAL VENOUS CANN RAP (CANNULA) IMPLANT
GLOVE BIO SURGEON STRL SZ 6 (GLOVE) ×36 IMPLANT
GLOVE BIOGEL PI IND STRL 6 (GLOVE) ×24 IMPLANT
GLOVE BIOGEL PI IND STRL 6.5 (GLOVE) ×8 IMPLANT
GLOVE BIOGEL PI INDICATOR 6 (GLOVE) ×12
GLOVE BIOGEL PI INDICATOR 6.5 (GLOVE) ×4
GLOVE ORTHO TXT STRL SZ7.5 (GLOVE) ×18 IMPLANT
GOWN STRL NON-REIN LRG LVL3 (GOWN DISPOSABLE) ×30 IMPLANT
GUIDEWIRE ANG ZIPWIRE 038X150 (WIRE) ×3 IMPLANT
INSERT CONFORM CROSS CLAMP 66M (MISCELLANEOUS) ×3 IMPLANT
INSERT CONFORM CROSS CLAMP 86M (MISCELLANEOUS) ×3 IMPLANT
KIT BASIN OR (CUSTOM PROCEDURE TRAY) ×3 IMPLANT
KIT DILATOR VASC 18G NDL (KITS) ×3 IMPLANT
KIT DRAINAGE VACCUM ASSIST (KITS) ×3 IMPLANT
KIT ROOM TURNOVER OR (KITS) ×3 IMPLANT
KIT SUCTION CATH 14FR (SUCTIONS) ×3 IMPLANT
LEAD PACING MYOCARDI (MISCELLANEOUS) ×3 IMPLANT
LINE VENT (MISCELLANEOUS) ×3 IMPLANT
NEEDLE AORTIC ROOT 14G 7F (CATHETERS) ×6 IMPLANT
NS IRRIG 1000ML POUR BTL (IV SOLUTION) ×15 IMPLANT
PACK OPEN HEART (CUSTOM PROCEDURE TRAY) ×3 IMPLANT
PAD ARMBOARD 7.5X6 YLW CONV (MISCELLANEOUS) ×6 IMPLANT
PAD ELECT DEFIB RADIOL ZOLL (MISCELLANEOUS) ×3 IMPLANT
PAIN PUMP ON-Q 400MLX5ML 5IN (MISCELLANEOUS) ×3 IMPLANT
PATCH CORMATRIX 4CMX7CM (Prosthesis & Implant Heart) ×3 IMPLANT
PROBE CRYO2-ABLATION MALLABLE (MISCELLANEOUS) ×3 IMPLANT
RETRACTOR PVM SOFT TISSUE M (INSTRUMENTS) IMPLANT
RETRACTOR TRL SOFT TISSUE LG (INSTRUMENTS) ×3 IMPLANT
RETRACTOR TRM SOFT TISSUE 7.5 (INSTRUMENTS) IMPLANT
RING MITRAL MEMO 3D 32MM SMD32 (Prosthesis & Implant Heart) ×3 IMPLANT
SET CANNULATION TOURNIQUET (MISCELLANEOUS) ×3 IMPLANT
SET CARDIOPLEGIA MPS 5001102 (MISCELLANEOUS) ×3 IMPLANT
SET IRRIG TUBING LAPAROSCOPIC (IRRIGATION / IRRIGATOR) ×3 IMPLANT
SOLUTION ANTI FOG 6CC (MISCELLANEOUS) ×3 IMPLANT
SPONGE GAUZE 4X4 12PLY (GAUZE/BANDAGES/DRESSINGS) ×3 IMPLANT
SUCKER WEIGHTED FLEX (MISCELLANEOUS) ×6 IMPLANT
SUT BONE WAX W31G (SUTURE) ×3 IMPLANT
SUT E-PACK MINIMALLY INVASIVE (SUTURE) ×3 IMPLANT
SUT ETHIBOND (SUTURE) ×3 IMPLANT
SUT ETHIBOND 2 0 SH (SUTURE) ×3 IMPLANT
SUT ETHIBOND 2 0 V4 (SUTURE) IMPLANT
SUT ETHIBOND 2 0V4 GREEN (SUTURE) IMPLANT
SUT ETHIBOND 2-0 RB-1 WHT (SUTURE) ×6 IMPLANT
SUT ETHIBOND 4 0 TF (SUTURE) IMPLANT
SUT ETHIBOND 5 0 C 1 30 (SUTURE) IMPLANT
SUT ETHIBOND NAB MH 2-0 36IN (SUTURE) IMPLANT
SUT ETHIBOND X763 2 0 SH 1 (SUTURE) ×3 IMPLANT
SUT GORETEX 6.0 TH-9 30 IN (SUTURE) IMPLANT
SUT GORETEX CV 4 TH 22 36 (SUTURE) ×3 IMPLANT
SUT GORETEX CV-5THC-13 36IN (SUTURE) ×21 IMPLANT
SUT GORETEX CV4 TH-18 (SUTURE) ×9 IMPLANT
SUT GORETEX TH-18 36 INCH (SUTURE) IMPLANT
SUT MNCRL AB 3-0 PS2 18 (SUTURE) IMPLANT
SUT PROLENE 3 0 SH DA (SUTURE) ×3 IMPLANT
SUT PROLENE 3 0 SH1 36 (SUTURE) IMPLANT
SUT PROLENE 4 0 RB 1 (SUTURE) ×6
SUT PROLENE 4-0 RB1 .5 CRCL 36 (SUTURE) ×4 IMPLANT
SUT PROLENE 5 0 C 1 36 (SUTURE) IMPLANT
SUT PROLENE 6 0 C 1 30 (SUTURE) IMPLANT
SUT SILK  1 MH (SUTURE) ×1
SUT SILK 1 MH (SUTURE) ×2 IMPLANT
SUT SILK 1 TIES 10X30 (SUTURE) IMPLANT
SUT SILK 2 0 SH CR/8 (SUTURE) ×3 IMPLANT
SUT SILK 2 0 TIES 10X30 (SUTURE) IMPLANT
SUT SILK 2 0SH CR/8 30 (SUTURE) IMPLANT
SUT SILK 3 0 (SUTURE)
SUT SILK 3 0 SH CR/8 (SUTURE) IMPLANT
SUT SILK 3 0SH CR/8 30 (SUTURE) IMPLANT
SUT SILK 3-0 18XBRD TIE 12 (SUTURE) IMPLANT
SUT TEM PAC WIRE 2 0 SH (SUTURE) IMPLANT
SUT VIC AB 2-0 CTX 36 (SUTURE) IMPLANT
SUT VIC AB 2-0 UR6 27 (SUTURE) IMPLANT
SUT VIC AB 3-0 SH 8-18 (SUTURE) IMPLANT
SUT VICRYL 2 TP 1 (SUTURE) IMPLANT
SYRINGE 10CC LL (SYRINGE) ×3 IMPLANT
SYS ATRICLIP LAA EXCLUSION 45 (CLIP) IMPLANT
SYSTEM SAHARA CHEST DRAIN ATS (WOUND CARE) ×6 IMPLANT
TAPE CLOTH SURG 4X10 WHT LF (GAUZE/BANDAGES/DRESSINGS) ×3 IMPLANT
TAPE PAPER 2X10 WHT MICROPORE (GAUZE/BANDAGES/DRESSINGS) ×3 IMPLANT
TOWEL OR 17X24 6PK STRL BLUE (TOWEL DISPOSABLE) ×3 IMPLANT
TOWEL OR 17X26 10 PK STRL BLUE (TOWEL DISPOSABLE) ×3 IMPLANT
TRAY FOLEY IC TEMP SENS 14FR (CATHETERS) ×3 IMPLANT
TROCAR XCEL BLADELESS 5X75MML (TROCAR) ×3 IMPLANT
TROCAR XCEL NON-BLD 11X100MML (ENDOMECHANICALS) ×6 IMPLANT
TUBE SUCT INTRACARD DLP 20F (MISCELLANEOUS) ×3 IMPLANT
TUNNELER SHEATH ON-Q 11GX8 (MISCELLANEOUS) ×3 IMPLANT
UNDERPAD 30X30 INCONTINENT (UNDERPADS AND DIAPERS) ×3 IMPLANT
WATER STERILE IRR 1000ML POUR (IV SOLUTION) ×6 IMPLANT
WIRE BENTSON .035X145CM (WIRE) ×3 IMPLANT

## 2012-06-28 NOTE — Anesthesia Procedure Notes (Addendum)
Procedure Name: Intubation Date/Time: 06/28/2012 8:38 AM Performed by: Brien Mates DOBSON Pre-anesthesia Checklist: Patient identified, Emergency Drugs available, Suction available, Patient being monitored and Timeout performed Patient Re-evaluated:Patient Re-evaluated prior to inductionOxygen Delivery Method: Circle system utilized Preoxygenation: Pre-oxygenation with 100% oxygen Intubation Type: IV induction Ventilation: Mask ventilation without difficulty Laryngoscope Size: Mac and 3 Grade View: Grade II Endobronchial tube: Double lumen EBT, Left, EBT position confirmed by fiberoptic bronchoscope and EBT position confirmed by auscultation and 37 Fr Number of attempts: 2 (laryngoscopy x1 by CRNA with grade 3 view and no attempt at passing the ETT.  DL x 1 by Dr Michelle Piper with grade 2 view and successgful placement of DLT.  ) Airway Equipment and Method: Stylet Placement Confirmation: ETT inserted through vocal cords under direct vision,  positive ETCO2 and breath sounds checked- equal and bilateral Secured at: 29 cm Tube secured with: Tape Dental Injury: Teeth and Oropharynx as per pre-operative assessment     Procedure Name: Intubation Date/Time: 06/28/2012 4:35 PM Performed by: Brien Mates DOBSON Pre-anesthesia Checklist: Patient identified, Emergency Drugs available, Suction available and Patient being monitored Patient Re-evaluated:Patient Re-evaluated prior to inductionOxygen Delivery Method: Circle system utilized Laryngoscope size: Elective glidecope and cook exchasnge catheter used to exchange DLT  Grade View: Grade I Tube type: Oral Tube size: 8.0 mm Number of attempts: 1 Airway Equipment and Method: Video-laryngoscopy and Stylet Placement Confirmation: ETT inserted through vocal cords under direct vision,  positive ETCO2 and breath sounds checked- equal and bilateral Secured at: 23 (at lip) cm Tube secured with: Tape Dental Injury: Teeth and Oropharynx as per  pre-operative assessment

## 2012-06-28 NOTE — H&P (Signed)
CARDIOTHORACIC SURGERY HISTORY AND PHYSICAL EXAM  PCP is Minda Meo, MD Referring Provider is Kathleene Hazel, MD   Reason for consultation:  Severe mitral regurgitation  HPI:  Patient is a 72 year old female from [place] with known history of mitral valve prolapse and mitral regurgitation who has been followed over the years by several different cardiologists, most recently by Dr Clifton James. She has known about mitral valve prolapse and mitral regurgitation for at least 7 or 8 years. She describes a long history of mild exertional shortness of breath, but she states that her breathing seemed to improve when she became more physically active. Up until recently she has been exercising on a fairly regular basis with only mild symptoms of intermittent exertional shortness of breath. Approximately 4 weeks ago she began to experience worsening exertional shortness of breath, tachypalpitations exacerbated with exercise, and a persistent dry nonproductive cough. These symptoms continued to worsen over several weeks.  She was seen as an outpatient by her primary care physician and a chest x-ray was performed demonstrating right lower lung opacity suspicious for pneumonia. She was treated with antibiotics.She ultimately presented to the [facility] cardiology office on January 20 where she was seen by Norma Fredrickson and Dr. Riley Kill.  She was noted to be in rapid atrial fibrillation with congestive heart failure and she was subsequently admitted to the hospital. Chest x-ray confirmed the presence of a right pleural effusion.  At the time of admission the patient had resting shortness breath, PND, and orthopnea. She had frequent tachypalpitations associated with rapid atrial fibrillation. She had dizzy spells without syncope. She had some tightness across her chest which is not related to physical exertion.   The patient's symptoms improved with rate-control for treatment of atrial  fibrillation and intravenous diuretic therapy for acute exacerbation of chronic diastolic congestive heart failure.  The patient was initially seen in consultation during that hospitalization, and she now returns for elective surgical intervention.  Since hospital discharge she reports remaining quite stable clinically. She states that her breathing is much better than it was when she first went to the hospital. She is been careful not to exert herself much and she has not been short of breath with mild physical activity. She has not had any chest pains or chest tightness. She states that in the morning after she takes her blood pressure and rate control medications she feels tired and fatigued. She's had some mild dizziness when she first stands up but no prolonged episodes of dizziness nor syncope. She denies resting shortness of breath, PND, orthopnea, or lower extremity edema. She has continued palpitations. She otherwise reports no new problems or complaints.   Past Medical History  Diagnosis Date  . Hypertension     Then hypotension 04/2012 limiting med adjustment.  . Diverticulitis   . Anxiety   . Mitral valve prolapse     a. H/o MVP, with severe MR 04/2012, awaiting surgery.  . Hyperlipidemia     recently- taken off Crestor- due to pain in her legs, but this was after cardiac cath. , pt. questioning whether the pain in her legs was related to cath. or crestor  . Goiter   . GERD (gastroesophageal reflux disease)   . Aortic stenosis 05/19/2012    trivial by TEE  . PONV (postoperative nausea and vomiting)   . Atrial fibrillation     a. Dx 04/2012.  . Mitral regurgitation     a. Severe by  TEE, pending surgery.  . Systolic CHF     a. EF 40% by studies 04/2012 (LV dysf felt 2/2 MR)  . Refusal of blood transfusions as patient is [employer]   . Shortness of breath   . Pneumonia     treated /w antibiotic- [facility]- early Feb. 2014  . Chronic kidney disease     stricture- of urethra -  treated /w Dr. Elige Radon  . Headache     h/o migraines     Past Surgical History  Procedure Laterality Date  . Uterine fibroid surgery  1988  . Ovarian cyst surgery  1988  . Tubal ligation  1974  . Tee without cardioversion  05/19/2012    Procedure: TRANSESOPHAGEAL ECHOCARDIOGRAM (TEE);  Surgeon: Dolores Patty, MD;  Location: [facility] ENDOSCOPY;  Service: Cardiovascular;  Laterality: N/A;    Family History  Problem Relation Age of Onset  . Colon cancer Father     Social History History  Substance Use Topics  . Smoking status: Never Smoker   . Smokeless tobacco: Never Used  . Alcohol Use: No     Comment: 1 drink a month    Prior to Admission medications   Medication Sig Start Date End Date Taking? Authorizing Provider  acetaminophen (TYLENOL) 500 MG tablet Take 500 mg by mouth every 6 (six) hours as needed. For pain   Yes Historical Provider, MD  ALPRAZolam (XANAX) 0.25 MG tablet Take 0.25 mg by mouth 3 (three) times daily as needed for sleep. For anxiety   Yes Historical Provider, MD  amiodarone (PACERONE) 200 MG tablet Take 1 tablet (200 mg total) by mouth 2 (two) times daily. 05/24/12  Yes Dayna N Dunn, PA-C  metoprolol tartrate (LOPRESSOR) 25 MG tablet Take 1 tablet (25 mg total) by mouth 3 (three) times daily. 05/31/12  Yes Beatrice Lecher, PA-C  potassium chloride SA (K-DUR,KLOR-CON) 20 MEQ tablet Take 1 tablet (20 mEq total) by mouth daily. 05/24/12  Yes Dayna N Dunn, PA-C  Probiotic Product (PROBIOTIC DAILY PO) Take 1 capsule by mouth daily.   Yes Historical Provider, MD  Rivaroxaban (XARELTO) 15 MG TABS tablet Take 1 tablet (15 mg total) by mouth daily with supper. 05/24/12  Yes Dayna N Dunn, PA-C  diltiazem (CARDIZEM CD) 120 MG 24 hr capsule Take 120 mg by mouth daily after breakfast. 05/24/12   Dayna N Dunn, PA-C  furosemide (LASIX) 20 MG tablet Take 20 mg by mouth daily after breakfast. 05/24/12   Dayna N Dunn, PA-C  iron polysaccharides (NIFEREX) 150 MG capsule Take 150 mg by  mouth daily after lunch. 05/24/12   Dayna N Dunn, PA-C    Allergies  Allergen Reactions  . Sulfa Drugs Cross Reactors Other (See Comments)    Unknown.  . Aspirin Rash  . Naproxen Sodium Swelling and Rash     Review of Systems:              General:                      normal appetite, decreased energy, no weight gain, no weight loss, no fever             Cardiac:                      no chest pain with exertion, occasional chest pain at rest, + SOB with mild exertion, + resting SOB, + PND, + orthopnea, + palpitations, + arrhythmia, + atrial fibrillation,  mild LE edema, + dizzy spells, no syncope             Respiratory:                + shortness of breath, no home oxygen, no productive cough, + dry cough, no bronchitis, + wheezing, no hemoptysis, no asthma, no pain with inspiration or cough, no sleep apnea, no CPAP at night             GI:                                no difficulty swallowing, no reflux, no frequent heartburn, no hiatal hernia, no abdominal pain, no constipation, no diarrhea, no hematochezia, no hematemesis, no melena             GU:                              no dysuria,  no frequency, no urinary tract infection, no hematuria, no kidney stones, no kidney disease             Vascular:                     no pain suggestive of claudication, no pain in feet, no leg cramps, no varicose veins, no DVT, no non-healing foot ulcer             Neuro:                         no stroke, no TIA's, no seizures, occasional headaches, no temporary blindness one eye,  no slurred speech, no peripheral neuropathy, no chronic pain, no instability of gait, no memory/cognitive dysfunction             Musculoskeletal:         no arthritis, no joint swelling, no myalgias, no difficulty walking, normal mobility               Skin:                            no rash, no itching, no skin infections, no pressure sores or ulcerations             Psych:                         + anxiety, no  depression, + nervousness, no unusual recent stress             Eyes:                           no blurry vision, no floaters, no recent vision changes, doesn't wears glasses or contacts             ENT:                            no hearing loss, no loose or painful teeth, no dentures, last saw dentist w/in last year - gets routine dental prophylaxis             Hematologic:               no easy bruising, no abnormal bleeding,  no clotting disorder, no frequent epistaxis             Endocrine:                   no diabetes, does not check CBG's at home                           Physical Exam:              BP 97/50  Pulse 108  Temp 98 F (36.7 C) (Oral)  Resp 18  Ht 5\' 7"  (1.702 m)  Wt 59.2 kg (130 lb 8.2 oz)  BMI 20.44 kg/m2  SpO2 97%             General:                      thin  well-appearing             HEENT:                       Unremarkable               Neck:                           no JVD, no bruits, no adenopathy               Chest:                         Diminished breath sounds right base, no wheezes, no rhonchi, + rales             CV:                              Irregular rhythm w/ elevated rate, + III/VI systolic murmur               Abdomen:                    soft, non-tender, no masses               Extremities:                 warm, well-perfused, pulses palpable but diminished in groins, trace LE edema             Rectal/GU                   Deferred             Neuro:                         Grossly non-focal and symmetrical throughout             Skin:                            Clean and dry, no rashes, no breakdown   Diagnostic Tests:  Transthoracic Echocardiography  Patient: Kassady, Laboy MR #: [id number] Study Date: 05/16/2012 Gender: F Age: 70 Height: 170.2cm Weight: 62.8kg BSA: 1.55m^2 Pt. Status: Room: 2601  Girtha Hake ATTENDING Olga Millers PERFORMING Corinda Gubler, [facility] Norma Fredrickson SONOGRAPHER  Melissa Morford, RDCS cc:  ------------------------------------------------------------ LV EF: 40%  LV EF: 40% - 45%  ------------------------------------------------------------ Indications: Atrial fibrillation - 427.31.  ------------------------------------------------------------ History: PMH: Murmur. Mitral valve prolapse. Risk factors: Hypertension. Dyslipidemia.  ------------------------------------------------------------ Study Conclusions  - Left ventricle: The cavity size was mildly dilated. Wall thickness was normal. Systolic function was mildly to moderately reduced. The estimated ejection fraction was 40%, in the range of 40% to 45%. Diffuse hypokinesis. The study is not technically sufficient to allow evaluation of LV diastolic function. Doppler parameters are consistent with high ventricular filling pressure. - Mitral valve: Severe prolapse, involving the anterior leaflet and the posterior leaflet. Severe regurgitation. - Left atrium: The atrium was moderately dilated. - Right atrium: The atrium was mildly dilated. - Pericardium, extracardiac: A small pericardial effusion was identified. Impressions:  - Markedly abnormal MV with thickening, bileaflet prolapse and probable severe MR (difficult to quantitate due to chaotic nature of jet); suggest TEE to further assess. Transthoracic echocardiography. M-mode, complete 2D, spectral Doppler, and color Doppler. Height: Height: 170.2cm. Height: 67in. Weight: Weight: 62.8kg. Weight: 138.2lb. Body mass index: BMI: 21.7kg/m^2. Body surface area: BSA: 1.41m^2. Blood pressure: 99/73. Patient status: Inpatient. Location: Echo laboratory.  ------------------------------------------------------------  ------------------------------------------------------------ Left ventricle: The cavity size was mildly dilated. Wall thickness was normal. Systolic function was mildly to moderately reduced. The estimated ejection fraction  was 40%, in the range of 40% to 45%. Diffuse hypokinesis. The study is not technically sufficient to allow evaluation of LV diastolic function. Doppler parameters are consistent with high ventricular filling pressure.  ------------------------------------------------------------ Aortic valve: Trileaflet; mildly thickened leaflets. Mobility was not restricted. Doppler: Transvalvular velocity was within the normal range. There was no stenosis. No regurgitation.  ------------------------------------------------------------ Aorta: Aortic root: The aortic root was normal in size.  ------------------------------------------------------------ Mitral valve: Moderately thickened leaflets . Mobility was not restricted. Severe prolapse, involving the anterior leaflet and the posterior leaflet. Doppler: Transvalvular velocity was within the normal range. There was no evidence for stenosis. Severe regurgitation. Peak gradient: 9mm Hg (D).  ------------------------------------------------------------ Left atrium: The atrium was moderately dilated.  ------------------------------------------------------------ Right ventricle: The cavity size was normal. Systolic function was normal.  ------------------------------------------------------------ Pulmonic valve: Doppler: Transvalvular velocity was within the normal range. There was no evidence for stenosis.  ------------------------------------------------------------ Tricuspid valve: Structurally normal valve. Doppler: Transvalvular velocity was within the normal range. Trivial regurgitation.  ------------------------------------------------------------ Pulmonary artery: Systolic pressure was within the normal range.  ------------------------------------------------------------ Right atrium: The atrium was mildly dilated.  ------------------------------------------------------------ Pericardium: A small pericardial effusion was  identified.  ------------------------------------------------------------ Systemic veins: Inferior vena cava: The vessel was normal in size.  ------------------------------------------------------------  2D measurements Normal Doppler Normal Left ventricle measurements LVID ED, 55.4 mm 43-52 Main pulmonary chord, artery PLAX Pressure, S 28 mm =30 LVID ES, 44.3 mm 23-38 Hg chord, Left ventricle PLAX Ea, lat 9.65 cm/ ------- FS, chord, 20 % >29 ann, tiss s PLAX DP LVPW, ED 9.94 mm ------ E/Ea, lat 15.65 ------- IVS/LVPW 1 <1.3 ann, tiss ratio, ED DP Ventricular septum Ea, med 8.44 cm/ ------- IVS, ED 9.94 mm ------ ann, tiss s Aorta DP Root diam, 33 mm ------ E/Ea, med 17.89 ------- ED ann, tiss Left atrium DP AP dim 50 mm ------ Mitral valve AP dim 2.9 cm/m^2 <2.2 Peak E vel 151 cm/ ------- index s Peak A vel 36.7 cm/ ------- s Deceleratio 232 ms 150-230 n time Peak 9 mm ------- gradient, D Hg Peak E/A 4.1 ------- ratio Regurg 36.8 cm/ ------- alias vel, s PISA Max regurg 554 cm/ ------- vel s Regurg VTI 159 cm -------  ERO, PISA 0.5 cm^ ------- 2 Regurg vol, 80 ml ------- PISA Tricuspid valve Regurg peak 210 cm/ ------- vel s Peak RV-RA 18 mm ------- gradient, S Hg Systemic veins Estimated 10 mm ------- CVP Hg Right ventricle Pressure, S 28 mm <30 Hg Sa vel, lat 9.43 cm/ ------- ann, tiss s DP  ------------------------------------------------------------ Prepared and Electronically Authenticated by  Olga Millers [id number]:[id number]      TRANSESOPHAGEAL ECHOCARDIOGRAM  NAME: VICKY SCHLEICH MRN: [id number]   DOB: [birth year] ADMIT DATE: 05/15/2012  INDICATIONS: Symptomatic AF   PROCEDURE:   Informed consent was obtained prior to the procedure. The risks, benefits and alternatives for the procedure were discussed and the patient comprehended these risks. Risks include, but are not limited to, cough, sore throat, vomiting, nausea,  somnolence, esophageal and stomach trauma or perforation, bleeding, low blood pressure, aspiration, pneumonia, infection, trauma to the teeth and death.   After a procedural time-out, the patient was given 5 mg versed and 75 mcg fentanyl for moderate sedation. The oropharynx was anesthetized 10 cc of topical 1% viscous lidocaine. The transesophageal probe was inserted in the esophagus and stomach without difficulty and multiple views were obtained.   FINDINGS:   LEFT VENTRICLE: EF = 45%. Global HK    RIGHT VENTRICLE: Mildly hypokinetic   LEFT ATRIUM: Severely enlarged   LEFT ATRIAL APPENDAGE: No clot   RIGHT ATRIUM: Mildly dialted   AORTIC VALVE: Trileaflet. Mildly calcified. No AI/AS   MITRAL VALVE: The posterior leaflet is markedly abnormal. It is very thickened with severe prolapse particularly in the P2 region. There may be a cleft or perforation in that region as well. There is severe MR with 2 eccentric jets.   TRICUSPID VALVE: Normal. Mild Tr   PULMONIC VALVE: Not visualized   INTERATRIAL SEPTUM: No PFO or ASD   PERICARDIUM: No effusion   DESCENDING AORTA: Moderate to severe plaque.   Cardioversion not performed due to size of LA and need for cath and probably MV surgery.   Truman Hayward   10:09 AM    Cardiac Catheterization Operative Report   XOIE KREUSER   [id number]   1/27/20141:00 PM   ARONSON,RICHARD A, MD   Procedure Performed:  1. Left Heart Catheterization 2. Selective Coronary Angiography 3. Right Heart Catheterization 4. Left ventricular angiogram 5. Distal Aortogram Operator: Verne Carrow, MD  Indication: 72 yo female with history of mitral valve prolapse admitted with atrial fibrillation with RVR, CHF and found to have LVEF 40%, severe MR . She has been seen by Dr. Cornelius Moras with CT surgery and plans are being made for mitral valve replacement.   Procedure Details:   The risks, benefits, complications, treatment options, and expected outcomes were  discussed with the patient. The patient and/or family concurred with the proposed plan, giving informed consent. The patient was brought to the cath lab after IV hydration was begun and oral premedication was given. The patient was further sedated with Versed and Fentanyl. The left groin was prepped and draped in the usual manner. Using the modified Seldinger access technique, a 5 French sheath was placed in the left femoral artery. A 6 French sheath was inserted into the left femoral vein. A multi-purpose catheter was used to perform a right heart catheterization. Standard diagnostic catheters were used to perform selective coronary angiography. A pigtail catheter was used to perform a left ventricular angiogram as well as a thoracic aortogram, abdominal aortogram with visualization of both iliac arteries. There were no immediate complications. The patient  was taken to the recovery area in stable condition.   Hemodynamic Findings:   Ao: 81/57  LV: 81/22/34  RA: 5  RV: 23/2/5  PA: 23/12 (mean 17)   PCWP: 15  Fick Cardiac Output: 4.1 L/min   Fick Cardiac Index: 2.5 L/min/m2   Central Aortic Saturation: 92%   Pulmonary Artery Saturation: 65%   Angiographic Findings:   Left main: No obstructive disease.   Left Anterior Descending Artery: Moderate caliber vessel that does not reach the apex. There is a moderate caliber diagonal branch with no obstructive disease noted.   Circumflex Artery: Moderate caliber vessel with no obstructive disease noted. The first obtuse marginal branch is moderate in caliber and has no disease.   Right Coronary Artery: Large caliber dominant vessel with no obstructive disease noted.   Left Ventricular Angiogram: LVEF=40% with global hypokinesis.  Impression:  1. No angiographic evidence of CAD   2. Moderate global LV systolic dysfunction   3. Severe mitral regurgitation   4. Atrial fibrillation  Impression:  Patient has severe symptomatic mitral regurgitation with  long-standing mitral valve prolapse involving flail segment of the posterior leaflet of the mitral valve, likely P3.  She was hospitalized last month with class IV congestive heart failure in the setting of persistent atrial fibrillation presumably of recent onset. Congestive heart failure has improved with diuretic therapy and rate control. Echocardiogram and catheterization is notable for moderate left ventricular dysfunction with ejection fraction estimated 40% despite the presence of severe mitral regurgitation. Catheterization is notable for the absence of significant coronary artery disease and pulmonary artery pressures were relatively low. Risks of surgery will be further increased because the patient is a member of the [employer] faith and she would not accept human blood products even in the event of life-threatening anemia or blood loss.   Plan:  I spent in excess of 60 minutes discussing the indications, risks, and potential benefits of surgery with the patient and her close friend, Ms. Marla Roe, here in the office today. During her previous hospitalization I met with her together with her daughter, one of her two sons, and another close friend from her church.  The rationale for elective mitral valve repair surgery has been explained, including a comparison between surgery and continued medical therapy with close follow-up.  The likelihood of successful and durable valve repair has been discussed with particular reference to the findings of their recent echocardiogram.  Based upon these findings and previous experience, I have quoted them a greater than 90 percent likelihood of successful valve repair.  In the unlikely event that their valve cannot be successfully repaired, we discussed the possibility of replacing the mitral valve using a mechanical prosthesis with the attendant need for long-term anticoagulation versus the alternative of replacing it using a bioprosthetic tissue  valve with its potential for late structural valve deterioration and failure, depending upon the patient's longevity.  The patient specifically requests that if the mitral valve must be replaced that it be done using a bioprosthetic tissue valve.  Alternative surgical approaches have been discussed, including a comparison between conventional sternotomy and minimally-invasive techniques.  The relative risks and benefits of each have been reviewed as they pertain to the patient's specific circumstances, and all of their questions have been addressed.  We also spent a considerable amount of time dosing the implications of her faith and associated beliefs regarding blood products and technical aspects of open heart surgery.  The patient specifically states that she would not  except transfusion of human blood products even in the event of life-threatening anemia or acute blood loss. She does wish that we use a Cell Saver device to scavenge all of her blood loss during the operation. She understands that during the conduct of open heart surgery her blood will be circulating through the heart-lung machine. She accepts albumin and other blood volume expanders.  She has never previously heard of recombinant factor VII as a treatment for bleeding, but she think she would except that if necessary. She understands that the development of severe acute blood loss anemia could cause other complications including brain injury, acute kidney injury, pulmonary edema, respiratory failure, or other complications which might prolong her recovery.  All of her questions been addressed.  We plan to proceed with surgery on February 25.    Salvatore Decent. Cornelius Moras, MD 06/12/2012 6:30 PM

## 2012-06-28 NOTE — Op Note (Signed)
CARDIOTHORACIC SURGERY OPERATIVE NOTE  Date of Procedure:  06/28/2012  Preoperative Diagnosis:   Severe Mitral Regurgitation  Persistent Atrial Fibrillation  Postoperative Diagnosis: Same  Procedure:    Minimally-Invasive Mitral Valve Repair Complex valvuloplasty including quadrangular resection of posterior leaflet with sliding leaflet plasty Gore-tex neocord placement x2 Sorin Memo 3D Ring Annuloplasty (size 32mm, catalog [id number], serial [id number])   Maze Procedure Complete biatrial lesion set using cryothermy Obliteration of Left Atrial Appendage  Surgeon: Salvatore Decent. Cornelius Moras, MD  Assistant: Lowella Dandy, PA-C  Anesthesia: Aubery Lapping, MD  Operative Findings:  Forme fruste variant of Barlow's disease with flail portion (P3) of posterior leaflet  Type II dysfunction with severe mitral regurgitation  Mild LV dysfunction  Trace tricuspid regurgitation  No residual mitral regurgitation following successful valve repair               BRIEF CLINICAL NOTE AND INDICATIONS FOR SURGERY  Patient is a 72 year old female from [place] with known history of mitral valve prolapse and mitral regurgitation who has been followed over the years by several different cardiologists, most recently by Dr Clifton James. She has known about mitral valve prolapse and mitral regurgitation for at least 7 or 8 years. She describes a long history of mild exertional shortness of breath, but she states that her breathing seemed to improve when she became more physically active. Up until recently she has been exercising on a fairly regular basis with only mild symptoms of intermittent exertional shortness of breath. Approximately 4 weeks ago she began to experience worsening exertional shortness of breath, tachypalpitations exacerbated with exercise, and a persistent dry nonproductive cough. These symptoms continued to worsen over several weeks.  She was seen as an outpatient by her primary  care physician and a chest x-ray was performed demonstrating right lower lung opacity suspicious for pneumonia. She was treated with antibiotics.She ultimately presented to the [facility] cardiology office on January 20 where she was seen by Norma Fredrickson and Dr. Riley Kill.  She was noted to be in rapid atrial fibrillation with congestive heart failure and she was subsequently admitted to the hospital. Chest x-ray confirmed the presence of a right pleural effusion.  At the time of admission the patient had resting shortness breath, PND, and orthopnea. She had frequent tachypalpitations associated with rapid atrial fibrillation. She had dizzy spells without syncope. She had some tightness across her chest which is not related to physical exertion.   The patient's symptoms improved with rate-control for treatment of atrial fibrillation and intravenous diuretic therapy for acute exacerbation of chronic diastolic congestive heart failure.  The patient was initially seen in consultation during that hospitalization, and she now returns for elective surgical intervention.  The patient has been counseled at length regarding the indications, risks and potential benefits of surgery.  All questions have been answered, and the patient provides full informed consent for the operation as described.     DETAILS OF THE OPERATIVE PROCEDURE  The patient is brought to the operating room on the above mentioned date and central monitoring was established by the anesthesia team including placement of Swan-Ganz catheter through the left internal jugular vein.  A radial arterial line is placed. The patient is placed in the supine position on the operating table.  Intravenous antibiotics are administered. General endotracheal anesthesia is induced uneventfully. The patient is initially intubated using a dual lumen endotracheal tube.  A Foley catheter is placed.  Baseline transesophageal echocardiogram was performed.  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 There is type II mitral valve dysfunction with severe (4+) mitral regurgitation. The left atrium is moderately dilated. The left ventricle is mildly dilated. There is normal or near normal left ventricular systolic function. The aortic valve is normal. Right ventricular size and function is normal. There is trace tricuspid regurgitation.  A soft roll is placed behind the patient's left scapula and the neck gently extended and turned to the left.   The patient's right neck, chest, abdomen, both groins, and both lower extremities are prepared and draped in a sterile manner. A time out procedure is performed.  A small incision is made in the right inguinal crease and the anterior surface of the right common femoral artery and right common femoral vein are identified.  A right miniature anterolateral thoracotomy incision is performed. The incision is placed just lateral to and superior to the right nipple. The pectoralis major muscle is retracted medially and completely preserved. The right pleural space is entered through the 3rd intercostal space.  Exposure is felt to be too cephalad and the 4th intercostal space is entered instead. A soft tissue retractor is placed.  Two 11 mm ports are placed through separate stab incisions inferiorly. The right pleural space is insufflated continuously with carbon dioxide gas through the posterior port during the remainder of the operation.  A pledgeted sutures placed through the dome of the right hemidiaphragm and retracted inferiorly to facilitate exposure.  A longitudinal incision is made in the pericardium 3 cm anterior to the phrenic nerve and silk traction sutures are placed on either side of the incision for exposure.  The patient is placed in Trendelenburg  position. The right internal jugular vein is cannulated with Seldinger technique and a guidewire advanced into the right atrium. The patient is heparinized systemically. The right internal jugular vein is cannulated with a 14 [place] pediatric femoral venous cannula. Pursestring sutures are placed on the anterior surface of the right common femoral vein and right common femoral artery. The right common femoral vein is cannulated with the Seldinger technique and a guidewire is advanced under transesophageal echocardiogram guidance through the right atrium. The femoral vein is cannulated with a long 22 French femoral venous cannula. The right common femoral artery is cannulated with Seldinger technique and a flexible guidewire is advanced until it can be appreciated intraluminally in the descending thoracic aorta on transesophageal echocardiogram. The femoral artery is cannulated with an 18 French femoral arterial cannula.  Adequate heparinization is verified.      The entire pre-bypass portion of the operation was notable for stable hemodynamics.  Cardiopulmonary bypass was begun.  Vacuum assist venous drainage is utilized. The incision in the pericardium is extended in both directions. Venous drainage and exposure are notably excellent.   The Atricure cryothermy system is utilized for all cryothermy ablation lesions for the Cox cryomaze procedure.  All cryothermy lesions are created using a minimum of 2 to 3 minutes duration at -60 degrees F.  The right atrial lateral wall lesions are placed including a longitudinal line along the lateral wall from the superior vena cava to the inferior vena cava.  A second lesion is then placed extending from the midportion of the first lesion in an anterior and inferior direction to reach the acute margin of the heart.  Attempts are made to place a retrograde cardioplegia cannula  through the right atrium into the coronary sinus using transesophageal echocardiogram guidance.   However, despite excellent visualization the catheter will not pass through the os and remains obstructed by a valve overlying the coronary sinus.  An antegrade cardioplegia cannula is placed in the ascending aorta.  The patient is cooled to 28C systemic temperature.  The aortic cross clamp is applied and cold blood cardioplegia is delivered initially in an antegrade fashion through the aortic root.   The initial cardioplegic arrest is rapid with early diastolic arrest.  Repeat doses of cardioplegia are administered intermittently every 20 to 30 minutes throughout the entire cross clamp portion of the operation through the aortic root in order to maintain completely flat electrocardiogram.  Myocardial protection was felt to be excellent.  A left atriotomy incision was performed through the interatrial groove and extended partially across the back wall of the left atrium after opening the oblique sinus inferiorly.  The mitral valve is exposed using a self-retaining retractor.  The mitral valve was inspected and notable for forme fruste variant of Barlow's disease.  The valve is relatively large size with particularly redundant tissue involving the posterior leaflet. Anterior leaflet is fairly normal. There is moderate prolapse of the P2 segment. There is severe prolapse of a subsegment of P3 associated with prolapse and ruptured primary cords. The subvalvular apparatus is normal.  The left atrial lesion set of the Cox cryomaze procedure is now performed using 3 minute duration for all cryothermy lesions.  Initially a lesion is placed along the endocardial surface of the left atrium from the caudad apex of the atriotomy incision across the posterior wall of the left atrium onto the posterior mitral annulus.  A mirror image lesion along the epicardial surface is then performed with the probe posterior to the left atrium, crossing over the coronary sinus.  Two lesions are then performed to create a box isolating  all of the pulmonary veins from the remainder of the left atrium.  The first lesion is placed from the cephalad apex of the atriotomy incision across the dome of the left atrium to just anterior to the left sided pulmonary veins.  The second lesion completes the box from the caudad apex of the atriotomy incision across the back wall of the left atrium to connect with the previous lesion just anterior to the left sided pulmonary veins.    Interrupted 2-0 Ethibond horizontal mattress sutures are placed circumferentially around the entire mitral valve annulus. The sutures will ultimately be utilized for ring annuloplasty, and at this juncture there are utilized to suspend the valve symmetrically.  A single pledgeted CV 4 Gore-Tex suture is placed through the head of the posterior papillary muscle in horizontal mattress fashion and the sutures tied. The 2 limbs of the Gore-Tex suture will be later woven into the posterior leaflet for artificial cord replacement.  The flail portion of the P3 segment of the posterior leaflet is resected using a quadrangular resection. The adjacent portion of P3 and P2 are each mobilized off of the posterior annulus to facilitate sliding leaflet plasty.  The posterior annulus is shortened using several interrupted figure-of-eight compression sutures. The adjacent portions of P3 and P2 are then reattached to the posterior annulus with 2 layer closure of running 4-0 Prolene. The intervening vertical defect is closed using everting simple CV 5 Gore-Tex suture.  The valve is tested with saline and appears reasonably competent even prior to ring annuloplasty.  The valve is sized to accept a 32mm  annuloplasty ring based upon the distance between the left and right commissures, the height and the surface area of the anterior leaflet.  A Sorin Memo 3D annuloplasty ring (size 32mm, catalog # [id number], serial # [id number]) is implanted uneventfully.  The individual limbs of the Goretex neocords  were retrieved from the LV chamber, woven into the P2 segment of the posterior leaflet beginning at the free margin where they were placed from the ventricular surface to the atrial surface, and then woven in a diamond shaped fashion towards the posterior mitral annulus.  The Goretex sutures were then tied while the LV was distended with saline so as to adjust the length of the neocords to the appropriate length.  The valve is again tested with saline and appears to be perfectly competent with a broad symmetrical line of coaptation of the anterior and posterior leaflet. There is no residual leak. Rewarming is begun.  The left atrial appendage was oversewn using a 2 layer closure of running 3-0 Prolene.  The atriotomy was closed using a 2-layer closure of running 3-0 Prolene suture after placing a sump drain across the mitral valve to serve as a left ventricular vent.  One final dose of warm retrograde "hot shot" cardioplegia was administered retrograde through the coronary sinus catheter while all air was evacuated through the aortic root.  The aortic cross clamp was removed after a total cross clamp time of 159 minutes.  A small hole in the right atrium opened near the acute margin of the heart along the previous oblique ablation line.  A right atrial cryothermy lesion was performed by placing the cryothermy probe through the small incision in the right atrium where the retrograde cannula was removed and placing it along the endocardial surface to cross the acute margin to the tricuspid annulus at the 2 o'clock position.  One final lesion is then placed along the lateral right atrial wall extending towards the tip of the right atrial appendage.  This completes the entire lesion right atrial lesion set of the Cox cryomaze procedure.  The two small right atriotomies were closed.  Epicardial pacing wires are fixed to the inferior wall of the right ventricule and to the right atrial appendage. The patient is  rewarmed to 37C temperature. The left ventricular vent is removed.  The patient is ventilated and flow volumes turndown while the mitral valve repair is inspected using transesophageal echocardiogram. The valve repair appears intact with no residual leak. The antegrade cardioplegia cannula is now removed. The patient is weaned and disconnected from cardiopulmonary bypass.  The patient's rhythm at separation from bypass was AV paced.  The patient was weaned from bypass without any inotropic support. Total cardiopulmonary bypass time for the operation was 212 minutes.  Followup transesophageal echocardiogram performed after separation from bypass revealed a well-seated annuloplasty ring in the mitral position with a normal functioning mitral valve. There was no residual leak.  Left ventricular function was unchanged from preoperatively.  The femoral arterial and venous cannulae were removed uneventfully. There was a palpable pulse in the distal right common femoral artery after removal of the cannula. Protamine was administered to reverse the anticoagulation. The right internal jugular cannula was removed and manual pressure held on the neck for 15 minutes.  Single lung ventilation was begun. The atriotomy closure was inspected for hemostasis. The pericardial sac was drained using a 28 French Bard drain placed through the anterior port incision.  The pericardium was closed using a patch of core matrix bovine  submucosal tissue patch. The right pleural space is irrigated with saline solution and inspected for hemostasis. The right pleural space was drained using a 28 French Bard drain placed through the posterior port incision. The miniature thoracotomy incision was closed in multiple layers in routine fashion. The right groin incision was inspected for hemostasis and closed in multiple layers in routine fashion.  The post-bypass portion of the operation was notable for stable rhythm and hemodynamics.  No blood  products were administered during the operation.  The patient tolerated the procedure well.  The patient was reintubated using a single lumen endotracheal tube and subsequently transported to the surgical intensive care unit in stable condition. There were no intraoperative complications. All sponge instrument and needle counts are verified correct at completion of the operation.    Salvatore Decent. Cornelius Moras MD 06/28/2012 6:28 PM

## 2012-06-28 NOTE — Preoperative (Signed)
Beta Blockers   Reason not to administer Beta Blockers:Not Applicable 

## 2012-06-28 NOTE — Progress Notes (Signed)
[facility] BRIEF SICU PROGRESS NOTE  Day of Surgery  S/P Procedure(s) (LRB): MINIMALLY INVASIVE MITRAL VALVE REPAIR (MVR) (Right) MINIMALLY INVASIVE MAZE PROCEDURE (N/A) INTRAOPERATIVE TRANSESOPHAGEAL ECHOCARDIOGRAM (N/A)   Waking up on vent AAI paced w/ stable hemodynamics Chest tube output low UOP adequate  Plan: Continue routine early postop  OWEN,CLARENCE H 06/28/2012 9:31 PM

## 2012-06-28 NOTE — Progress Notes (Signed)
  Echocardiogram Echocardiogram Transesophageal has been performed.  [name] [name] 06/28/2012, 1:16 PM

## 2012-06-28 NOTE — Brief Op Note (Addendum)
06/28/2012  2:35 PM  PATIENT:  [name] [name]  72 y.o. female  PRE-OPERATIVE DIAGNOSIS:  MR A-FIB  POST-OPERATIVE DIAGNOSIS:  MR A-FIB  PROCEDURE:  Procedure(s):  MINIMALLY INVASIVE MITRAL VALVE REPAIR  -Complex Repair - Sliding Annuloplasty with 32 mm Sorin Memo 3D Ring -Quadrangular resection of the posterior leafleft -Suspension of 2 Neochords  MINIMALLY INVASIVE MAZE PROCEDURE  -Complete Bi Atrial Lesion set using Cryothermy  with over sewing of Left Atrial appendage  INTRAOPERATIVE TRANSESOPHAGEAL ECHOCARDIOGRAM (N/A)  SURGEON:    Purcell Nails, MD  ASSISTANTS:  Lowella Dandy, PA-C  ANESTHESIA:   Aubery Lapping, MD  CROSSCLAMP TIME:   159'  CARDIOPULMONARY BYPASS TIME: 212'  FINDINGS:  Forme fruste variant of Barlow's disease with flail portion (P3) of posterior leaflet  Type II dysfunction with severe mitral regurgitation  Mild LV dysfunction  Trace tricuspid regurgitation  No residual mitral regurgitation following successful valve repair  COMPLICATIONS: none  PATIENT DISPOSITION:   TO SICU IN STABLE CONDITION  OWEN,CLARENCE H 06/28/2012 4:27 PM

## 2012-06-28 NOTE — Progress Notes (Signed)
Placed pt back on full support. Pt is not awake enough at this time.

## 2012-06-28 NOTE — Transfer of Care (Signed)
Immediate Anesthesia Transfer of Care Note  Patient: [name] [name]  Procedure(s) Performed: Procedure(s): MINIMALLY INVASIVE MITRAL VALVE REPAIR (MVR) (Right) MINIMALLY INVASIVE MAZE PROCEDURE (N/A) INTRAOPERATIVE TRANSESOPHAGEAL ECHOCARDIOGRAM (N/A)  Patient Location: SICU  Anesthesia Type:General  Level of Consciousness: sedated  Airway & Oxygen Therapy: Patient remains intubated per anesthesia plan and Patient placed on Ventilator (see vital sign flow sheet for setting)  Post-op Assessment: Report given to PACU RN and Post -op Vital signs reviewed and stable  Post vital signs: Reviewed and stable  Complications: No apparent anesthesia complications

## 2012-06-28 NOTE — Anesthesia Preprocedure Evaluation (Addendum)
Anesthesia Evaluation  Patient identified by MRN, date of birth, ID band Patient awake    Reviewed: Allergy & Precautions, H&P , NPO status , Patient's Chart, lab work & pertinent test results, reviewed documented beta blocker date and time   History of Anesthesia Complications (+) PONV  Airway Mallampati: I TM Distance: >3 FB Neck ROM: Full    Dental   Pulmonary shortness of breath, with exertion and lying,          Cardiovascular hypertension, Pt. on medications +CHF + dysrhythmias Atrial Fibrillation     Neuro/Psych    GI/Hepatic GERD-  Medicated and Controlled,  Endo/Other    Renal/GU      Musculoskeletal   Abdominal   Peds  Hematology   Anesthesia Other Findings   Reproductive/Obstetrics                          Anesthesia Physical Anesthesia Plan  ASA: IV  Anesthesia Plan: General   Post-op Pain Management:    Induction: Intravenous  Airway Management Planned: Oral ETT  Additional Equipment: Arterial line, CVP, PA Cath, TEE and Ultrasound Guidance Line Placement  Intra-op Plan:   Post-operative Plan: Post-operative intubation/ventilation  Informed Consent: I have reviewed the patients History and Physical, chart, labs and discussed the procedure including the risks, benefits and alternatives for the proposed anesthesia with the patient or authorized representative who has indicated his/her understanding and acceptance.   Dental advisory given  Plan Discussed with: CRNA, Surgeon and Anesthesiologist  Anesthesia Plan Comments: (Pt is a Jehova's witness and does not want to get blood products to save her life.)       Anesthesia Quick Evaluation

## 2012-06-28 NOTE — Interval H&P Note (Signed)
History and Physical Interval Note:  06/28/2012 7:53 AM  [name]  has presented today for surgery, with the diagnosis of MR A-FIB  The various methods of treatment have been discussed with the patient and family. After consideration of risks, benefits and other options for treatment, the patient has consented to  Procedure(s): MINIMALLY INVASIVE MITRAL VALVE REPAIR (MVR) (Right) MINIMALLY INVASIVE MAZE PROCEDURE (N/A) INTRAOPERATIVE TRANSESOPHAGEAL ECHOCARDIOGRAM (N/A) as a surgical intervention .  The patient's history has been reviewed, patient examined, no change in status, stable for surgery.  I have reviewed the patient's chart and labs.  Questions were answered to the patient's satisfaction.     OWEN,CLARENCE H

## 2012-06-29 ENCOUNTER — Encounter (HOSPITAL_COMMUNITY): Payer: Self-pay | Admitting: Thoracic Surgery (Cardiothoracic Vascular Surgery)

## 2012-06-29 ENCOUNTER — Inpatient Hospital Stay (HOSPITAL_COMMUNITY): Payer: Medicare Other

## 2012-06-29 LAB — POCT I-STAT 3, ART BLOOD GAS (G3+)
Acid-base deficit: 4 mmol/L — ABNORMAL HIGH (ref 0.0–2.0)
Acid-base deficit: 5 mmol/L — ABNORMAL HIGH (ref 0.0–2.0)
Bicarbonate: 22.4 mEq/L (ref 20.0–24.0)
Bicarbonate: 21.3 mEq/L (ref 20.0–24.0)
O2 Saturation: 95 %
O2 Saturation: 98 %
TCO2: 24 mmol/L (ref 0–100)
TCO2: 22 mmol/L (ref 0–100)
pCO2 arterial: 34 mmHg — ABNORMAL LOW (ref 35.0–45.0)
pCO2 arterial: 36.7 mmHg (ref 35.0–45.0)
pH, Arterial: 7.31 — ABNORMAL LOW (ref 7.350–7.450)
pO2, Arterial: 69 mmHg — ABNORMAL LOW (ref 80.0–100.0)
pO2, Arterial: 89 mmHg (ref 80.0–100.0)

## 2012-06-29 LAB — POCT I-STAT, CHEM 8
BUN: 13 mg/dL (ref 6–23)
Calcium, Ion: 1.25 mmol/L (ref 1.13–1.30)
Creatinine, Ser: 0.7 mg/dL (ref 0.50–1.10)
Glucose, Bld: 155 mg/dL — ABNORMAL HIGH (ref 70–99)
Glucose, Bld: 131 mg/dL — ABNORMAL HIGH (ref 70–99)
HCT: 27 % — ABNORMAL LOW (ref 36.0–46.0)
Hemoglobin: 9.2 g/dL — ABNORMAL LOW (ref 12.0–15.0)
Potassium: 4.4 mEq/L (ref 3.5–5.1)
Sodium: 140 mEq/L (ref 135–145)
TCO2: 21 mmol/L (ref 0–100)

## 2012-06-29 LAB — CBC
HCT: 27.8 % — ABNORMAL LOW (ref 36.0–46.0)
MCH: 30.1 pg (ref 26.0–34.0)
MCH: 29.9 pg (ref 26.0–34.0)
MCHC: 35.1 g/dL (ref 30.0–36.0)
MCV: 85.8 fL (ref 78.0–100.0)
MCV: 87.4 fL (ref 78.0–100.0)
Platelets: 99 10*3/uL — ABNORMAL LOW (ref 150–400)
RBC: 3.09 MIL/uL — ABNORMAL LOW (ref 3.87–5.11)
RBC: 3.18 MIL/uL — ABNORMAL LOW (ref 3.87–5.11)
WBC: 20.5 10*3/uL — ABNORMAL HIGH (ref 4.0–10.5)

## 2012-06-29 LAB — CREATININE, SERUM: GFR calc Af Amer: >90 mL/min (ref >90)

## 2012-06-29 LAB — GLUCOSE, CAPILLARY
Glucose-Capillary: 154 mg/dL — ABNORMAL HIGH (ref 70–99)
Glucose-Capillary: 113 mg/dL — ABNORMAL HIGH (ref 70–99)
Glucose-Capillary: 109 mg/dL — ABNORMAL HIGH (ref 70–99)
Glucose-Capillary: 107 mg/dL — ABNORMAL HIGH (ref 70–99)
Glucose-Capillary: 109 mg/dL — ABNORMAL HIGH (ref 70–99)

## 2012-06-29 LAB — BASIC METABOLIC PANEL
CO2: 22 mEq/L (ref 19–32)
Calcium: 8.4 mg/dL (ref 8.4–10.5)
Creatinine, Ser: 0.72 mg/dL (ref 0.50–1.10)

## 2012-06-29 LAB — MAGNESIUM
Magnesium: 2.5 mg/dL (ref 1.5–2.5)
Magnesium: 2 mg/dL (ref 1.5–2.5)

## 2012-06-29 MED ORDER — POTASSIUM CHLORIDE 10 MEQ/50ML IV SOLN
10.0000 meq | INTRAVENOUS | Status: AC
  Administered 2012-06-29 (×3): 10 meq via INTRAVENOUS

## 2012-06-29 MED ORDER — DEXTROSE 5 % IV SOLN
30.0000 ug/min | INTRAVENOUS | Status: DC
  Filled 2012-06-29 (×2): qty 1

## 2012-06-29 MED ORDER — ALPRAZOLAM 0.25 MG PO TABS
0.2500 mg | ORAL_TABLET | Freq: Three times a day (TID) | ORAL | Status: DC | PRN
  Administered 2012-06-29: 0.25 mg via ORAL
  Filled 2012-06-29 (×2): qty 1

## 2012-06-29 MED ORDER — INSULIN ASPART 100 UNIT/ML ~~LOC~~ SOLN: 0.0000 [IU] | SUBCUTANEOUS | Status: DC

## 2012-06-29 MED ORDER — MORPHINE SULFATE 2 MG/ML IJ SOLN
1.0000 mg | INTRAMUSCULAR | Status: DC | PRN
  Administered 2012-06-30 – 2012-07-03 (×4): 1 mg via INTRAVENOUS
  Filled 2012-06-29 (×3): qty 1

## 2012-06-29 MED ORDER — FUROSEMIDE 10 MG/ML IJ SOLN
20.0000 mg | Freq: Four times a day (QID) | INTRAMUSCULAR | Status: DC
  Administered 2012-06-29 (×2): 20 mg via INTRAVENOUS
  Filled 2012-06-29 (×2): qty 2

## 2012-06-29 MED ORDER — LORAZEPAM 2 MG/ML IJ SOLN
0.5000 mg | INTRAMUSCULAR | Status: DC | PRN
  Administered 2012-06-29: 0.5 mg via INTRAVENOUS
  Filled 2012-06-29: qty 1

## 2012-06-29 MED ORDER — MORPHINE SULFATE 2 MG/ML IJ SOLN: 2.0000 mg | INTRAMUSCULAR | Status: DC | PRN

## 2012-06-29 MED ORDER — PROMETHAZINE HCL 25 MG/ML IJ SOLN
12.5000 mg | Freq: Four times a day (QID) | INTRAMUSCULAR | Status: DC | PRN
  Administered 2012-06-29: 12.5 mg via INTRAVENOUS
  Filled 2012-06-29 (×2): qty 1

## 2012-06-29 MED FILL — Mannitol IV Soln 20%: INTRAVENOUS | Qty: 500 | Status: AC

## 2012-06-29 MED FILL — Sodium Bicarbonate IV Soln 8.4%: INTRAVENOUS | Qty: 50 | Status: AC

## 2012-06-29 MED FILL — Potassium Chloride Inj 2 mEq/ML: INTRAVENOUS | Qty: 40 | Status: AC

## 2012-06-29 MED FILL — Heparin Sodium (Porcine) Inj 1000 Unit/ML: INTRAMUSCULAR | Qty: 30 | Status: AC

## 2012-06-29 MED FILL — Lidocaine HCl IV Inj 20 MG/ML: INTRAVENOUS | Qty: 5 | Status: AC

## 2012-06-29 MED FILL — Sodium Chloride IV Soln 0.9%: INTRAVENOUS | Qty: 1000 | Status: AC

## 2012-06-29 MED FILL — Electrolyte-R (PH 7.4) Solution: INTRAVENOUS | Qty: 4000 | Status: AC

## 2012-06-29 MED FILL — Magnesium Sulfate Inj 50%: INTRAMUSCULAR | Qty: 10 | Status: AC

## 2012-06-29 MED FILL — Sodium Chloride Irrigation Soln 0.9%: Qty: 3000 | Status: AC

## 2012-06-29 MED FILL — Heparin Sodium (Porcine) Inj 1000 Unit/ML: INTRAMUSCULAR | Qty: 10 | Status: AC

## 2012-06-29 NOTE — Progress Notes (Signed)
While weighing pt, pt began to dry heave and wretch.  Emesis basin provided.  Pt continually asking for water after this episode.  Advised pt to hold off on POs until nausea under control.

## 2012-06-29 NOTE — Procedures (Signed)
Extubation Procedure Note  Patient Details:   Name: [name] [name] DOB: 22-Jan-1941 MRN: [id number]     Evaluation  O2 sats: stable throughout Complications: No apparent complications Patient did tolerate procedure well. Bilateral Breath Sounds: Clear;Diminished   Yes  Pt extubated per protocol. NIF -30, FVC 780. Pt able to breath around cuff. Placed pt on 3L N/C. SpO2 100%. Pt able to vocalize and oriented to place. No stridor present at this time. BBS diminished. Pt instructed on IS. RN will continue to work with pt on IS.  Fredrich Birks 06/29/2012, 1:24 AM

## 2012-06-29 NOTE — Progress Notes (Signed)
Pt doing well post-op day 1. Slightly confused tonight. Atrial paced. Hemodynamically stable.   MCALHANY,CHRISTOPHER 5:57 PM 06/29/2012

## 2012-06-29 NOTE — Progress Notes (Signed)
Per monitor, HR dropped into 40's even though A-pacing. MiliAmps turned up to 15 and now capturing appropriately at set rate of 80. BP unaffected and otherwise asymptomatic. Will continue to monitor.

## 2012-06-29 NOTE — Anesthesia Postprocedure Evaluation (Signed)
Anesthesia Post Note  Patient: [name] [name]  Procedure(s) Performed: Procedure(s) (LRB): MINIMALLY INVASIVE MITRAL VALVE REPAIR (MVR) (Right) MINIMALLY INVASIVE MAZE PROCEDURE (N/A) INTRAOPERATIVE TRANSESOPHAGEAL ECHOCARDIOGRAM (N/A)  Anesthesia type: General  Patient location: ICU  Post pain: Pain level controlled  Post assessment: Post-op Vital signs reviewed  Last Vitals:  Filed Vitals:   06/29/12 1320  BP: 95/34  Pulse: 80  Temp:   Resp: 11    Post vital signs: stable  Level of consciousness: Patient remains intubated per anesthesia plan  Complications: No apparent anesthesia complications

## 2012-06-29 NOTE — Progress Notes (Signed)
Clinical Social Work Department BRIEF PSYCHOSOCIAL ASSESSMENT 06/29/2012  Patient:  [name] [name], [name] [name]     Account Number:  [id number]     Admit date:  06/28/2012  Clinical Social Worker:  Madaline Guthrie  Date/Time:  06/29/2012 04:10 PM  Referred by:  Care Management  Date Referred:  06/29/2012 Referred for  SNF Placement   Other Referral:   Interview type:  Family Other interview type:   Phone interview with daughter    PSYCHOSOCIAL DATA Living Status:  ALONE Admitted from facility:   Level of care:   Primary support name:  Karlyn Agee Primary support relationship to patient:  CHILD, ADULT Degree of support available:   Pt has a daughter and 2 sons who are supportive and constantly check in on her at home.  Pts daughter stated that pt has lots of friends and neighbors who help her at home.  See face sheet for contact information    CURRENT CONCERNS Current Concerns  None Noted   Other Concerns:    SOCIAL WORK ASSESSMENT / PLAN CSW intern attempted to meet with pt who was not oriented and confused when entered the room. CSW intern then contacted pts daughter who had just visited with pt earlier that day. Daughter states that pt lived at home alone before coming into the hospital and was very independent. Pts sons both live close by so they would be able to check on pt once discharged from SNF. Daughter also stated that she helps her mother whenever needed. Pts daughter states that her mother is aware that she needs SNF before returning home and requests to go to [facility]. CSW intern explained to daughter the SNF process and will call her once bed offers are received. Daughter states that her and her brothers will be in and out visiting pt while she is in the hospital. Daughter was so sweet and engaging. Pt has wonderful strong support system.   Assessment/plan status:  Other - See comment Other assessment/ plan:   SNF placement  [facility] requested    Information/referral to community resources:    PATIENTS/FAMILYS RESPONSE TO PLAN OF CARE: Pt understands she needs SNF per pts daughter. Pt will have a great amount of support once discharged.

## 2012-06-29 NOTE — Progress Notes (Signed)
During extubation process pt very agitated, reaching for ETT.  Constant reminders to keep hands down.  2 RNs and RT at bedside to help prevent pt from self extubation.  Will continue to monitor.

## 2012-06-29 NOTE — Progress Notes (Signed)
Pt continually asks why "we hate her".  Advised her that we are trying to keep her safe and get her better.  Willc ontinue to monitor.

## 2012-06-29 NOTE — Progress Notes (Signed)
T. CTS p.m. Rounds  Patient with worsening confusion through the late afternoon and evening Now with admit and restraints and with a sitter at bedside Hemodynamics, O2 saturation, vital signs all acceptable Pain medication and anxiety medications have been adjusted

## 2012-06-29 NOTE — Care Management Note (Signed)
    Page 1 of 2   07/07/2012     4:23:34 PM   CARE MANAGEMENT NOTE 07/07/2012  Patient:  [name] [name], [name] [name]   Account Number:  [id number]  Date Initiated:  06/29/2012  Documentation initiated by:  [facility]  Subjective/Objective Assessment:   post op MVR - Maze.     Action/Plan:   Anticipated DC Date:  07/06/2012   Anticipated DC Plan:  SKILLED NURSING FACILITY  In-house referral  Clinical Social Worker      DC Planning Services  CM consult      Choice offered to / List presented to:             Status of service:  Completed, signed off Medicare Important Message given?   (If response is "NO", the following Medicare IM given date fields will be blank) Date Medicare IM given:   Date Additional Medicare IM given:    Discharge Disposition:  SKILLED NURSING FACILITY  Per UR Regulation:  Reviewed for med. necessity/level of care/duration of stay  If discussed at Long Length of Stay Meetings, dates discussed:    Comments:  ContactKarlyn [name] Daughter [phone]                [name], [name] [phone]                [name], [name] [phone]   [phone]  07/06/12 JULIE AMERSON,RN,BSN [phone] PT DISCHARGED TO SNF TODAY, PER CSW ARRANGEMENTS.  07/05/12 JULIE AMERSON,RN,BSN [phone] MET WITH PT TO DISCUSS DC PLANS.  NO 24HR SUPERVISION AT HOME; SHE IS DESIRING PLACEMENT AT SNF FOR REHAB AT DC. CSW FOLLOWING TO FACILITATE DC TO SNF WHEN MEDICALLY STABLE.  06-29-12 2:15pm [name], RNBSN [phone] Lives at home alone - made arrangements prior to coming in to go to [facility] that is close to her home per nurse. SW consult placed. Needs PT/OT ordered.

## 2012-06-29 NOTE — Progress Notes (Signed)
Frequent reminders to pt not to pick and pull at lines.  Will continue to monitor.

## 2012-06-29 NOTE — Progress Notes (Signed)
TCTS BRIEF SICU PROGRESS NOTE  1 Day Post-Op  S/P Procedure(s) (LRB): MINIMALLY INVASIVE MITRAL VALVE REPAIR (MVR) (Right) MINIMALLY INVASIVE MAZE PROCEDURE (N/A) INTRAOPERATIVE TRANSESOPHAGEAL ECHOCARDIOGRAM (N/A)   Ms Rincon is much more confused, lethargic and delirious this afternoon.  She has been given both Xanax and Ativan, as well as morphine and phenergan.  She still follows simple commands and displays no focal findings on exam.    Plan: Will need bed-side sitter and soft restraints for safety.  Will d/c Xanax and Ativan.  Continue to monitor mental status closely.  OWEN,CLARENCE H 06/29/2012 2:49 PM

## 2012-06-29 NOTE — Progress Notes (Signed)
   CARDIOTHORACIC SURGERY PROGRESS NOTE   R1 Day Post-Op Procedure(s) (LRB): MINIMALLY INVASIVE MITRAL VALVE REPAIR (MVR) (Right) MINIMALLY INVASIVE MAZE PROCEDURE (N/A) INTRAOPERATIVE TRANSESOPHAGEAL ECHOCARDIOGRAM (N/A)  Subjective: Looks good but very anxious.  Feels nauseated. Some pain in back.  Objective: Vital signs: BP Readings from Last 1 Encounters:  06/29/12 108/48   Pulse Readings from Last 1 Encounters:  06/29/12 80   Resp Readings from Last 1 Encounters:  06/29/12 15   Temp Readings from Last 1 Encounters:  06/29/12 97.5 F (36.4 C)     Hemodynamics: PAP: (21-39)/(7-19) 36/17 mmHg CO:  [3.2 L/min-4.2 L/min] 4.2 L/min CI:  [2 L/min/m2-2.7 L/min/m2] 2.7 L/min/m2  Physical Exam:  Rhythm:   Sinus brady - AAI paced  Breath sounds: clear  Heart sounds:  RRR w/ no murmur  Incisions:  Dressings dry, intact  Abdomen:  Soft, non-distended, non-tender  Extremities:  Warm, well perfused   Intake/Output from previous day: 03/05 0701 - 03/06 0700 In: 6595.1 [I.V.:5545.1; Blood:150; IV Piggyback:900] Out: 3825 [Urine:2975; Blood:300; Chest Tube:550] Intake/Output this shift:    Lab Results:  Recent Labs  06/28/12 2300 06/28/12 2303 06/29/12 0450  WBC 18.5*  --  16.8*  HGB 10.8* 9.9* 9.3*  HCT 31.1* 29.0* 26.5*  PLT 111*  --  99*   BMET:  Recent Labs  06/28/12 2303 06/29/12 0450  NA 140 139  K 4.4 3.8  CL 110 109  CO2  --  22  GLUCOSE 155* 124*  BUN 13 13  CREATININE 0.70 0.72  CALCIUM  --  8.4    CBG (last 3)   Recent Labs  06/29/12 0101 06/29/12 0230 06/29/12 0442  GLUCAP 119* 154* 113*   ABG    Component Value Date/Time   PHART 7.310* 06/29/2012 0230   HCO3 21.3 06/29/2012 0230   TCO2 23 06/29/2012 0230   ACIDBASEDEF 5.0* 06/29/2012 0230   O2SAT 96.0 06/29/2012 0230   CXR: Clear w/ very mild basilar atelectasis, R>L  Assessment/Plan: S/P Procedure(s) (LRB): MINIMALLY INVASIVE MITRAL VALVE REPAIR (MVR) (Right) MINIMALLY INVASIVE  MAZE PROCEDURE (N/A) INTRAOPERATIVE TRANSESOPHAGEAL ECHOCARDIOGRAM (N/A)  Overall doing remarkably well POD1 Chronic anxiety, acutely anxious post-op Expected post op acute blood loss anemia, mild, stable Expected post op volume excess, moderate Maintaining NSR/AAI paced rhythm so far Jehovah's Witness faith - refusal of all blood products   Resume Xanax - takes it chronically at home  Mobilize  Diuresis  D/C lines  Continue AAI pacing for now, hold amiodarone until nausea improved and d/c lopressor, will restart amiodarone IV if Afib recurs and patient remains nauseated  Will resume Xarelto prior to d/c - not a candidate for coumadin    OWEN,CLARENCE H 06/29/2012 7:49 AM

## 2012-06-29 NOTE — Progress Notes (Signed)
Clinical Social Work Department CLINICAL SOCIAL WORK PLACEMENT NOTE 06/29/2012  Patient:  [name] [name], [name] [name]  Account Number:  [id number] Admit date:  06/28/2012  Clinical Social Worker:  Salomon Fick, LCSW  Date/time:  06/29/2012 04:20 PM  Clinical Social Work is seeking post-discharge placement for this patient at the following level of care:   SKILLED NURSING   (*CSW will update this form in Epic as items are completed)   06/29/2012  Patient/family provided with [facility] Department of Clinical Social Works list of facilities offering this level of care within the geographic area requested by the patient (or if unable, by the patients family).  06/29/2012  Patient/family informed of their freedom to choose among providers that offer the needed level of care, that participate in Medicare, Medicaid or managed care program needed by the patient, have an available bed and are willing to accept the patient.  06/29/2012  Patient/family informed of [facility] ownership interest in [facility], as well as of the fact that they are under no obligation to receive care at this facility.  PASARR submitted to EDS on  PASARR number received from EDS on   FL2 transmitted to all facilities in geographic area requested by pt/family on  06/29/2012 FL2 transmitted to all facilities within larger geographic area on 06/29/2012  Patient informed that his/her managed care company has contracts with or will negotiate with  certain facilities, including the following:     Patient/family informed of bed offers received:   Patient chooses bed at [facility] Physician recommends and patient chooses bed at    Patient to be transferred to  on   Patient to be transferred to facility by   The following physician request were entered in Epic:   Additional Comments: Patient requests [facility] and second choice [facility].

## 2012-06-29 NOTE — Progress Notes (Signed)
UR Completed.  [name] [name] [phone] 06/29/2012  

## 2012-06-30 ENCOUNTER — Inpatient Hospital Stay (HOSPITAL_COMMUNITY): Payer: Medicare Other

## 2012-06-30 LAB — CBC
HCT: 26 % — ABNORMAL LOW (ref 36.0–46.0)
MCHC: 33.8 g/dL (ref 30.0–36.0)
Platelets: 79 10*3/uL — ABNORMAL LOW (ref 150–400)
RDW: 15.2 % (ref 11.5–15.5)
WBC: 17.7 10*3/uL — ABNORMAL HIGH (ref 4.0–10.5)

## 2012-06-30 LAB — GLUCOSE, CAPILLARY: Glucose-Capillary: 108 mg/dL — ABNORMAL HIGH (ref 70–99)

## 2012-06-30 LAB — BASIC METABOLIC PANEL
BUN: 15 mg/dL (ref 6–23)
Calcium: 9.1 mg/dL (ref 8.4–10.5)
Chloride: 106 mEq/L (ref 96–112)
Creatinine, Ser: 0.85 mg/dL (ref 0.50–1.10)
GFR calc Af Amer: 79 mL/min — ABNORMAL LOW (ref >90)

## 2012-06-30 MED ORDER — METOPROLOL TARTRATE 12.5 MG HALF TABLET
12.5000 mg | ORAL_TABLET | Freq: Two times a day (BID) | ORAL | Status: DC
  Administered 2012-07-02: 12.5 mg via ORAL
  Filled 2012-06-30 (×8): qty 1

## 2012-06-30 MED ORDER — AMIODARONE HCL 200 MG PO TABS
200.0000 mg | ORAL_TABLET | Freq: Two times a day (BID) | ORAL | Status: DC
  Administered 2012-06-30 – 2012-07-02 (×5): 200 mg via ORAL
  Filled 2012-06-30 (×8): qty 1

## 2012-06-30 MED ORDER — ENOXAPARIN SODIUM 40 MG/0.4ML ~~LOC~~ SOLN
40.0000 mg | SUBCUTANEOUS | Status: DC
  Filled 2012-06-30 (×2): qty 0.4

## 2012-06-30 MED ORDER — FUROSEMIDE 10 MG/ML IJ SOLN
20.0000 mg | Freq: Four times a day (QID) | INTRAMUSCULAR | Status: AC
  Administered 2012-06-30 – 2012-07-02 (×7): 20 mg via INTRAVENOUS
  Filled 2012-06-30 (×9): qty 2

## 2012-06-30 NOTE — Evaluation (Signed)
Physical Therapy Evaluation Patient Details Name: [name] [name] MRN: [id number] DOB: 28-Oct-1940 Today's Date: 06/30/2012 Time: 9562-1308 PT Time Calculation (min): 23 min  PT Assessment / Plan / Recommendation Clinical Impression  72 yo adm for minimally invasive MVR with post-op confusion will benefit from PT to incr safety and independence with her mobility. Pt lives alone and will need post-acute therapy on d/c.    PT Assessment  Patient needs continued PT services    Follow Up Recommendations  SNF    Does the patient have the potential to tolerate intense rehabilitation      Barriers to Discharge Decreased caregiver support      Equipment Recommendations  Other (comment) (TBD)    Recommendations for Other Services     Frequency Min 2X/week    Precautions / Restrictions Precautions Precautions: Fall Restrictions Weight Bearing Restrictions: No   Pertinent Vitals/Pain Chest pain--unable to rate due to confusion; wincing with coughing (using pillow with cues)      Mobility  Bed Mobility Bed Mobility: Rolling Left;Left Sidelying to Sit;Sitting - Scoot to [place] of Bed Rolling Left: 4: Min assist Left Sidelying to Sit: 1: +2 Total assist;HOB flat Left Sidelying to Sit: Patient Percentage: 70% Sitting - Scoot to Edge of Bed: 4: Min assist Details for Bed Mobility Assistance: +2 for multiple lines and safety due to pt's impulsivity; vc for technique Transfers Transfers: Sit to Stand;Stand to Sit Sit to Stand: From bed;With upper extremity assist;1: +2 Total assist Sit to Stand: Patient Percentage: 70% Stand to Sit: 1: +2 Total assist;To chair/3-in-1;With upper extremity assist Stand to Sit: Patient Percentage: 70% Details for Transfer Assistance: +2 total assist for steadying and safety. Ambulation/Gait Ambulation/Gait Assistance: 1: +2 Total assist Ambulation/Gait: Patient Percentage: 50% Ambulation Distance (Feet): 8 Feet Assistive device: Rolling  walker Ambulation/Gait Assistance Details: pt pushing RW too far ahead (and then leaving her feet behind with strong anterior lean); 3rd person Banker) brought chair to pt after 8 feet due to decr safety Gait Pattern: Step-through pattern;Decreased stride length    Exercises     PT Diagnosis: Difficulty walking;Acute pain  PT Problem List: Decreased balance;Decreased mobility;Decreased cognition;Decreased knowledge of use of DME;Decreased safety awareness;Pain PT Treatment Interventions: DME instruction;Gait training;Functional mobility training;Therapeutic activities;Cognitive remediation;Patient/family education   PT Goals Acute Rehab PT Goals PT Goal Formulation: With patient Time For Goal Achievement: 07/07/12 Potential to Achieve Goals: Good Pt will go Supine/Side to Sit: with supervision;with HOB 0 degrees PT Goal: Supine/Side to Sit - Progress: Goal set today Pt will go Sit to Supine/Side: with supervision;with HOB 0 degrees PT Goal: Sit to Supine/Side - Progress: Goal set today Pt will go Sit to Stand: with supervision PT Goal: Sit to Stand - Progress: Goal set today Pt will Ambulate: 51 - 150 feet;with min assist;with least restrictive assistive device PT Goal: Ambulate - Progress: Goal set today  Visit Information  Last PT Received On: 06/30/12 Assistance Needed: +2 PT/OT Co-Evaluation/Treatment: Yes    Subjective Data  Subjective: Pt lucid at times, and then very confused/delusional Patient Stated Goal: get back to [place] to exercise   Prior Functioning  Home Living Lives With: Alone Available Help at Discharge: Skilled Nursing Facility Additional Comments: per chart, pt has planned to go to [facility] prior to surgery Prior Function Level of Independence: Independent Communication Communication: No difficulties    Cognition  Cognition Overall Cognitive Status: Impaired Area of Impairment: Memory;Safety/judgement;Awareness of errors;Awareness of  deficits;Problem solving Arousal/Alertness: Awake/alert Orientation Level: Disoriented to;Time Behavior  During Session: Restless Safety/Judgement: Impulsive;Decreased awareness of need for assistance Awareness of Errors: Assistance required to identify errors made;Assistance required to correct errors made Awareness of Errors - Other Comments: pt leaning very far forward in standing (with feet trailing behind) with no awareness Cognition - Other Comments: knew she had been "crazy" since surgery, at times lucid, then later stating she was a bunny and the hunters were going to shoot her    Extremity/Trunk Assessment Right Upper Extremity Assessment RUE ROM/Strength/Tone: Unable to fully assess;Due to impaired cognition;WFL for tasks assessed Left Upper Extremity Assessment LUE ROM/Strength/Tone: [facility] for tasks assessed;Unable to fully assess;Due to impaired cognition Right Lower Extremity Assessment RLE ROM/Strength/Tone: [facility] for tasks assessed Left Lower Extremity Assessment LLE ROM/Strength/Tone: [facility] for tasks assessed   Balance Balance Balance Assessed: Yes Static Sitting Balance Static Sitting - Balance Support: Feet supported;No upper extremity supported Static Sitting - Level of Assistance: 5: Stand by assistance;4: Min assist Static Sitting - Comment/# of Minutes: Pt sat EOB ~11min with close guarding for safety.  End of Session PT - End of Session Activity Tolerance: Other (comment) (limited by confusion/decr safety) Patient left: in chair;with family/visitor present;with restraints reapplied;Other (comment) (bil mitts; RN requested sheet tied around waist ) Nurse Communication: Mobility status;Other (comment) (continued decr cognition)  GP     SASSER,LYNN 06/30/2012, 5:57 PM Pager [phone]

## 2012-06-30 NOTE — Progress Notes (Signed)
PT Cancellation Note  Patient Details Name: [name] [name] MRN: [id number] DOB: 10-16-1940   Cancelled Treatment:    Reason Eval/Treat Not Completed: Fatigue/lethargy limiting ability to participate (Nursing asked PT/OT to HOLD as pt did not sleep last night .)  Pt also confused per nursing.  Will re-attempt as able in pm.     INGOLD,DAWN 06/30/2012, 9:35 AM [facility] [phone] (pager)

## 2012-06-30 NOTE — Progress Notes (Signed)
OT Cancellation Note  06/30/2012 Cipriano Mile OTR/L Pager [phone] Office [phone]

## 2012-06-30 NOTE — Progress Notes (Signed)
Mental status improved  BP 100/49  Pulse 80  Temp(Src) 97.5 F (36.4 C) (Oral)  Resp 25  Wt 146 lb 9.7 oz (66.5 kg)  BMI 23.67 kg/m2  SpO2 100%   Intake/Output Summary (Last 24 hours) at 06/30/12 1845 Last data filed at 06/30/12 1800  Gross per 24 hour  Intake 2167.5 ml  Output   2215 ml  Net  -47.5 ml   No PM labs

## 2012-06-30 NOTE — Progress Notes (Addendum)
CARDIOTHORACIC SURGERY PROGRESS NOTE   R2 Days Post-Op Procedure(s) (LRB): MINIMALLY INVASIVE MITRAL VALVE REPAIR (MVR) (Right) MINIMALLY INVASIVE MAZE PROCEDURE (N/A) INTRAOPERATIVE TRANSESOPHAGEAL ECHOCARDIOGRAM (N/A)  Subjective: Much more alert but oriented only to name.  Severe delirium persists with some agitation.  According to patient's family this has happened after surgical procedures in the past.  Objective: Vital signs: BP Readings from Last 1 Encounters:  06/30/12 138/45   Pulse Readings from Last 1 Encounters:  06/30/12 80   Resp Readings from Last 1 Encounters:  06/30/12 18   Temp Readings from Last 1 Encounters:  06/30/12 98.9 F (37.2 C) Oral    Hemodynamics:    Physical Exam:  Rhythm:   sinus  Breath sounds: clear  Heart sounds:  RRR  Incisions:  Clean and dry  Abdomen:  soft  Extremities:  warm   Intake/Output from previous day: 03/06 0701 - 03/07 0700 In: 1729.9 [P.O.:330; I.V.:1199.9; IV Piggyback:200] Out: 3050 [Urine:2450; Chest Tube:600] Intake/Output this shift: Total I/O In: 120 [I.V.:120] Out: 230 [Urine:210; Chest Tube:20]  Lab Results:  Recent Labs  06/29/12 1715 06/30/12 0400  WBC 20.5* 17.7*  HGB 9.5* 8.8*  HCT 27.8* 26.0*  PLT 100* 79*   BMET:  Recent Labs  06/29/12 0450 06/29/12 1707 06/29/12 1715 06/30/12 0400  NA 139 142  --  140  K 3.8 4.4  --  4.5  CL 109 106  --  106  CO2 22  --   --  26  GLUCOSE 124* 131*  --  139*  BUN 13 13  --  15  CREATININE 0.72 0.90 0.79 0.85  CALCIUM 8.4  --   --  9.1    CBG (last 3)   Recent Labs  06/29/12 2353 06/30/12 0350 06/30/12 0809  GLUCAP 108* 127* 119*   ABG    Component Value Date/Time   PHART 7.310* 06/29/2012 0230   HCO3 21.3 06/29/2012 0230   TCO2 24 06/29/2012 1707   ACIDBASEDEF 5.0* 06/29/2012 0230   O2SAT 96.0 06/29/2012 0230   CXR: *RADIOLOGY REPORT*  Clinical Data: Post minimally invasive mitral valve repair and  intraoperative transesophageal  echocardiogram  PORTABLE CHEST - 1 VIEW  Comparison: 06/29/2012  Findings: The left internal jugular introducer sheath and left  subclavian introducer sheaths remain in place. The Swan-Ganz  catheter has been removed. Two right-sided chest tubes are noted  with a trace persistent apical pneumothorax. Epicardial pacer  wires are noted.  Heart and mediastinal contours are unchanged. There has been  interval development of retrocardiac density most compatible with  developing left lower lobe volume loss. The remainder of the lungs  are clear and show no signs of new focal infiltrate or congestive  failure. No pleural fluid is seen.  IMPRESSION:  Support apparatus as above.  Persistent small trace right apical pneumothorax.  Increasing left lower lobe density most likely atelectasis.  Original Report Authenticated By: Rhodia Albright, M.D.   Assessment/Plan: S/P Procedure(s) (LRB): MINIMALLY INVASIVE MITRAL VALVE REPAIR (MVR) (Right) MINIMALLY INVASIVE MAZE PROCEDURE (N/A) INTRAOPERATIVE TRANSESOPHAGEAL ECHOCARDIOGRAM (N/A)  Overall stable POD2 except for post-op delirium Expected post op acute blood loss anemia, mild, stable Expected post op volume excess, mild, diuresing Maintaining NSR w/ HR increased today Post-op thrombocytopenia, worse Chronic anxiety Jehovah's Witness faith - refusal of all blood products   Will need to continue soft restraints, bedside sitter for patient safety  Resume amiodarone and low dose metoprolol  Continue diuresis  Watch platelet count  Lovenox for  DVT prophylaxis to begin tomorrow unless platelet count drops further  Will start Xarelto for Afib prior to d/c but continue to hold for now  Haldol for severe agitation if necessary  Minimize narcotics and other medications which might further cloud sensorium  Minimize blood draws  Mobilize  Keep chest tubes until output decreased  Keep in SICU   [facility] H 06/30/2012 10:44  AM

## 2012-06-30 NOTE — Progress Notes (Signed)
Pt HR dropped to 40s, pacemaker turned back on to AAI rate 8, mA 10, and sensitivity to 2.0. Will continue to monitor.

## 2012-06-30 NOTE — Evaluation (Signed)
Occupational Therapy Evaluation Patient Details Name: [name] [name] MRN: [id number] DOB: 05-21-1940 Today's Date: 06/30/2012 Time: 4098-1191 OT Time Calculation (min): 28 min  OT Assessment / Plan / Recommendation Clinical Impression  Pt s/p minimially invasive MVR.  Will benefit from acute OT services to address below problem list. Recommending SNF to [facility] progress rehab before return home.    OT Assessment  Patient needs continued OT Services    Follow Up Recommendations  SNF    Barriers to Discharge      Equipment Recommendations  3 in 1 bedside comode    Recommendations for Other Services    Frequency  Min 2X/week    Precautions / Restrictions Precautions Precautions: Fall Restrictions Weight Bearing Restrictions: No   Pertinent Vitals/Pain See vitals    ADL  Eating/Feeding: Performed;Moderate assistance (due to restraints) Where Assessed - Eating/Feeding: Edge of bed Upper Body Bathing: Simulated;Maximal assistance Where Assessed - Upper Body Bathing: Unsupported sitting Lower Body Bathing: Simulated;+1 Total assistance Where Assessed - Lower Body Bathing: Unsupported sitting Where Assessed - Upper Body Dressing: Unsupported sitting Lower Body Dressing: Performed;+1 Total assistance Where Assessed - Lower Body Dressing: Unsupported sitting Toilet Transfer: Simulated;+2 Total assistance Toilet Transfer: Patient Percentage: 50% Toilet Transfer Method: Sit to stand Toilet Transfer Equipment: Other (comment) (chair) Equipment Used: Rolling walker Transfers/Ambulation Related to ADLs: +2 total assist (pt=50%) for ambulation with 3rd person providing chair follow. ADL Comments: Pt very agreeable during session although confused.  Eager to walk with OT/PT.    OT Diagnosis: Generalized weakness;Cognitive deficits;Acute pain  OT Problem List: Decreased strength;Decreased activity tolerance;Impaired balance (sitting and/or standing);Decreased cognition;Decreased  knowledge of use of DME or AE;Pain;Decreased safety awareness OT Treatment Interventions: Self-care/ADL training;DME and/or AE instruction;Therapeutic activities;Patient/family education;Balance training;Cognitive remediation/compensation   OT Goals Acute Rehab OT Goals OT Goal Formulation: With patient Time For Goal Achievement: 07/14/12 Potential to Achieve Goals: Good ADL Goals Pt Will Perform Grooming: Standing at sink;with min assist ADL Goal: Grooming - Progress: Goal set today Pt Will Perform Upper Body Bathing: with min assist;Sitting, chair;Sitting, edge of bed ADL Goal: Upper Body Bathing - Progress: Goal set today Pt Will Perform Lower Body Bathing: with min assist;Sit to stand from chair;Sit to stand from bed ADL Goal: Lower Body Bathing - Progress: Goal set today Pt Will Transfer to Toilet: Ambulation;with DME;Comfort height toilet;with mod assist ADL Goal: Toilet Transfer - Progress: Goal set today Pt Will Perform Toileting - Clothing Manipulation: with min assist;Standing ADL Goal: Toileting - Clothing Manipulation - Progress: Goal set today Pt Will Perform Toileting - Hygiene: with min assist;Standing at 3-in-1/toilet ADL Goal: Toileting - Hygiene - Progress: Goal set today Miscellaneous OT Goals Miscellaneous OT Goal #1: Pt will perform bed mobility with min assist as precursor for ADL retraining. OT Goal: Miscellaneous Goal #1 - Progress: Goal set today  Visit Information  Last OT Received On: 06/30/12 Assistance Needed: +2 PT/OT Co-Evaluation/Treatment: Yes    Subjective Data  Subjective: "I think I'm a bunny, a brown one."   Prior Functioning     Home Living Lives With: Alone Available Help at Discharge: Skilled Nursing Facility Additional Comments: per chart, pt has planned to go to [facility] prior to surgery Prior Function Level of Independence: Independent Communication Communication: No difficulties         Vision/Perception      Cognition  Cognition Overall Cognitive Status: Impaired Area of Impairment: Memory;Safety/judgement;Awareness of errors;Awareness of deficits;Problem solving Arousal/Alertness: Awake/alert Orientation Level: Disoriented to;Time Behavior During Session: Restless Safety/Judgement:  Impulsive;Decreased awareness of need for assistance Awareness of Errors: Assistance required to identify errors made;Assistance required to correct errors made Awareness of Errors - Other Comments: pt leaning very far forward in standing (with feet trailing behind) with no awareness Cognition - Other Comments: knew she had been "crazy" since surgery, at times lucid, then later stating she was a bunny and the hunters were going to shoot her    Extremity/Trunk Assessment Right Upper Extremity Assessment RUE ROM/Strength/Tone: Unable to fully assess;Due to impaired cognition;WFL for tasks assessed Left Upper Extremity Assessment LUE ROM/Strength/Tone: [facility] for tasks assessed;Unable to fully assess;Due to impaired cognition Right Lower Extremity Assessment RLE ROM/Strength/Tone: [facility] for tasks assessed Left Lower Extremity Assessment LLE ROM/Strength/Tone: [facility] for tasks assessed     Mobility Bed Mobility Bed Mobility: Rolling Left;Left Sidelying to Sit;Sitting - Scoot to [place] of Bed Rolling Left: 4: Min assist Left Sidelying to Sit: 1: +2 Total assist;HOB flat Left Sidelying to Sit: Patient Percentage: 70% Sitting - Scoot to Edge of Bed: 4: Min assist Details for Bed Mobility Assistance: +2 for multiple lines and safety due to pt's impulsivity; vc for technique Transfers Transfers: Sit to Stand;Stand to Sit Sit to Stand: From bed;With upper extremity assist;1: +2 Total assist Sit to Stand: Patient Percentage: 70% Stand to Sit: 1: +2 Total assist;To chair/3-in-1;With upper extremity assist Stand to Sit: Patient Percentage: 70% Details for Transfer Assistance: +2 total assist for steadying and safety.      Exercise     Balance Balance Balance Assessed: Yes Static Sitting Balance Static Sitting - Balance Support: Feet supported;No upper extremity supported Static Sitting - Level of Assistance: 5: Stand by assistance;4: Min assist Static Sitting - Comment/# of Minutes: Pt sat EPB ~40min with close guarding for safety.   End of Session OT - End of Session Equipment Utilized During Treatment:  (RW) Activity Tolerance: Patient tolerated treatment well Patient left: in chair;with call bell/phone within reach;with restraints reapplied;with family/visitor present Nurse Communication: Mobility status  GO   06/30/2012 Cipriano Mile OTR/L Pager [phone] Office [phone]   Cipriano Mile 06/30/2012, 5:40 PM

## 2012-07-01 ENCOUNTER — Inpatient Hospital Stay (HOSPITAL_COMMUNITY): Payer: Medicare Other

## 2012-07-01 LAB — CBC
HCT: 21.8 % — ABNORMAL LOW (ref 36.0–46.0)
Hemoglobin: 7.5 g/dL — ABNORMAL LOW (ref 12.0–15.0)
RBC: 2.45 MIL/uL — ABNORMAL LOW (ref 3.87–5.11)
WBC: 11.8 10*3/uL — ABNORMAL HIGH (ref 4.0–10.5)

## 2012-07-01 LAB — BASIC METABOLIC PANEL
BUN: 13 mg/dL (ref 6–23)
CO2: 30 mEq/L (ref 19–32)
Chloride: 106 mEq/L (ref 96–112)
GFR calc Af Amer: >90 mL/min (ref >90)
Glucose, Bld: 87 mg/dL (ref 70–99)
Potassium: 3.6 mEq/L (ref 3.5–5.1)

## 2012-07-01 MED ORDER — POTASSIUM CHLORIDE CRYS ER 20 MEQ PO TBCR
20.0000 meq | EXTENDED_RELEASE_TABLET | Freq: Two times a day (BID) | ORAL | Status: AC
  Administered 2012-07-01 (×2): 20 meq via ORAL
  Filled 2012-07-01 (×2): qty 1

## 2012-07-01 MED ORDER — POTASSIUM CHLORIDE 10 MEQ/50ML IV SOLN
10.0000 meq | INTRAVENOUS | Status: AC | PRN
Start: 2012-07-01 — End: 2012-07-01
  Administered 2012-07-01 (×3): 10 meq via INTRAVENOUS
  Filled 2012-07-01: qty 150

## 2012-07-01 MED ORDER — ALPRAZOLAM 0.25 MG PO TABS
0.2500 mg | ORAL_TABLET | Freq: Three times a day (TID) | ORAL | Status: DC | PRN
  Administered 2012-07-01 – 2012-07-06 (×6): 0.25 mg via ORAL
  Filled 2012-07-01 (×7): qty 1

## 2012-07-01 NOTE — Progress Notes (Signed)
Stable day  BP 125/52  Pulse 90  Temp(Src) 98.8 F (37.1 C) (Oral)  Resp 19  Wt 136 lb 14.5 oz (62.1 kg)  BMI 22.11 kg/m2  SpO2 96%   Intake/Output Summary (Last 24 hours) at 07/01/12 1648 Last data filed at 07/01/12 1500  Gross per 24 hour  Intake   1730 ml  Output   3470 ml  Net  -1740 ml    Continue current care

## 2012-07-01 NOTE — Progress Notes (Signed)
3 Days Post-Op Procedure(s) (LRB): MINIMALLY INVASIVE MITRAL VALVE REPAIR (MVR) (Right) MINIMALLY INVASIVE MAZE PROCEDURE (N/A) INTRAOPERATIVE TRANSESOPHAGEAL ECHOCARDIOGRAM (N/A) Subjective: No complaints Oriented x 4 but anxious  Objective: Vital signs in last 24 hours: Temp:  [97.5 F (36.4 C)-98.8 F (37.1 C)] 98 F (36.7 C) (03/08 0741) Pulse Rate:  [47-90] 90 (03/08 0800) Cardiac Rhythm:  [-] Atrial paced (03/08 0800) Resp:  [14-27] 22 (03/08 0800) BP: (83-142)/(27-79) 97/39 mmHg (03/08 0800) SpO2:  [92 %-100 %] 97 % (03/08 0800) Weight:  [136 lb 14.5 oz (62.1 kg)] 136 lb 14.5 oz (62.1 kg) (03/08 0500)  Hemodynamic parameters for last 24 hours:    Intake/Output from previous day: 03/07 0701 - 03/08 0700 In: 1890 [P.O.:1380; I.V.:460; IV Piggyback:50] Out: 2670 [Urine:2120; Chest Tube:550] Intake/Output this shift: Total I/O In: 70 [I.V.:20; IV Piggyback:50] Out: 355 [Urine:325; Chest Tube:30]  General appearance: alert and cooperative Neurologic: delirium improved Heart: regular rate and rhythm Lungs: diminished breath sounds bibasilar Wound: clean and dry  Lab Results:  Recent Labs  06/30/12 0400 07/01/12 0435  WBC 17.7* 11.8*  HGB 8.8* 7.5*  HCT 26.0* 21.8*  PLT 79* 67*   BMET:  Recent Labs  06/30/12 0400 07/01/12 0435  NA 140 138  K 4.5 3.6  CL 106 106  CO2 26 30  GLUCOSE 139* 87  BUN 15 13  CREATININE 0.85 0.73  CALCIUM 9.1 8.7    PT/INR: No results found for this basename: LABPROT, INR,  in the last 72 hours ABG    Component Value Date/Time   PHART 7.310* 06/29/2012 0230   HCO3 21.3 06/29/2012 0230   TCO2 24 06/29/2012 1707   ACIDBASEDEF 5.0* 06/29/2012 0230   O2SAT 96.0 06/29/2012 0230   CBG (last 3)   Recent Labs  06/29/12 2353 06/30/12 0350 06/30/12 0809  GLUCAP 108* 127* 119*    Assessment/Plan: S/P Procedure(s) (LRB): MINIMALLY INVASIVE MITRAL VALVE REPAIR (MVR) (Right) MINIMALLY INVASIVE MAZE PROCEDURE (N/A) INTRAOPERATIVE  TRANSESOPHAGEAL ECHOCARDIOGRAM (N/A) - CV- still atrial paced, otherwise stable  RESP- pulmonary toilet  RENAL- continue diuresis  Thrombocytopenia- will not start enoxaparin, check HIT panel, no sign of HITT  Anemia- no blood products   LOS: 3 days    HENDRICKSON,STEVEN C 07/01/2012

## 2012-07-02 ENCOUNTER — Inpatient Hospital Stay (HOSPITAL_COMMUNITY): Payer: Medicare Other

## 2012-07-02 MED ORDER — FUROSEMIDE 10 MG/ML IJ SOLN
20.0000 mg | Freq: Once | INTRAMUSCULAR | Status: AC
  Administered 2012-07-02: 20 mg via INTRAVENOUS
  Filled 2012-07-02: qty 2

## 2012-07-02 NOTE — Progress Notes (Addendum)
4 Days Post-Op Procedure(s) (LRB): MINIMALLY INVASIVE MITRAL VALVE REPAIR (MVR) (Right) MINIMALLY INVASIVE MAZE PROCEDURE (N/A) INTRAOPERATIVE TRANSESOPHAGEAL ECHOCARDIOGRAM (N/A) Subjective:  [name] [name] states she feels better this morning.  She is ambulating + flatus, no BM  Objective: Vital signs in last 24 hours: Temp:  [97.9 F (36.6 C)-98.8 F (37.1 C)] 98.7 F (37.1 C) (03/09 0758) Pulse Rate:  [89-90] 90 (03/09 0800) Cardiac Rhythm:  [-] Atrial paced (03/09 0800) Resp:  [14-26] 17 (03/09 0800) BP: (92-143)/(38-122) 111/51 mmHg (03/09 0800) SpO2:  [93 %-100 %] 99 % (03/09 0800) Weight:  [132 lb 4.4 oz (60 kg)] 132 lb 4.4 oz (60 kg) (03/09 0500)  Intake/Output from previous day: 03/08 0701 - 03/09 0700 In: 1460 [P.O.:900; I.V.:460; IV Piggyback:100] Out: 4670 [Urine:4420; Chest Tube:250] Intake/Output this shift: Total I/O In: 20 [I.V.:20] Out: 10 [Chest Tube:10]  General appearance: alert, cooperative and no distress Heart: regular rate and rhythm and paced Lungs: clear to auscultation bilaterally Abdomen: soft, non-tender; bowel sounds normal; no masses,  no organomegaly Extremities: extremities normal, atraumatic, no cyanosis or edema Wound: clean and dry  Lab Results:  Recent Labs  06/30/12 0400 07/01/12 0435  WBC 17.7* 11.8*  HGB 8.8* 7.5*  HCT 26.0* 21.8*  PLT 79* 67*   BMET:  Recent Labs  06/30/12 0400 07/01/12 0435  NA 140 138  K 4.5 3.6  CL 106 106  CO2 26 30  GLUCOSE 139* 87  BUN 15 13  CREATININE 0.85 0.73  CALCIUM 9.1 8.7    PT/INR: No results found for this basename: LABPROT, INR,  in the last 72 hours ABG    Component Value Date/Time   PHART 7.310* 06/29/2012 0230   HCO3 21.3 06/29/2012 0230   TCO2 24 06/29/2012 1707   ACIDBASEDEF 5.0* 06/29/2012 0230   O2SAT 96.0 06/29/2012 0230   CBG (last 3)   Recent Labs  06/29/12 2353 06/30/12 0350 06/30/12 0809  GLUCAP 108* 127* 119*    Assessment/Plan: S/P Procedure(s)  (LRB): MINIMALLY INVASIVE MITRAL VALVE REPAIR (MVR) (Right) MINIMALLY INVASIVE MAZE PROCEDURE (N/A) INTRAOPERATIVE TRANSESOPHAGEAL ECHOCARDIOGRAM (N/A)  1. CV- remains atrial paced, Bradycardic underneath 2. Pulm- no acute issues off oxygen, continue IS 3. Expected Post Operative Blood Loss- Hgb 7.5- will not receive transfusion, patient is Jehovahs witness 4. Renal- remains volume overloaded- continue diuresis 5. Dispo- Chest tube with 250cc out yesterday will leave in place patient stable continue current care   LOS: 4 days    BARRETT, ERIN 07/02/2012  6:45 PM c/o pain from dressing pulling on right nipple when she coughs Diuresed well yesterday Will give another 20 mg lasix tonight

## 2012-07-03 ENCOUNTER — Inpatient Hospital Stay (HOSPITAL_COMMUNITY): Payer: Medicare Other

## 2012-07-03 LAB — CBC
Hemoglobin: 8.6 g/dL — ABNORMAL LOW (ref 12.0–15.0)
MCH: 29.9 pg (ref 26.0–34.0)
MCV: 86.8 fL (ref 78.0–100.0)
Platelets: 107 10*3/uL — ABNORMAL LOW (ref 150–400)
RBC: 2.88 MIL/uL — ABNORMAL LOW (ref 3.87–5.11)

## 2012-07-03 LAB — BASIC METABOLIC PANEL
CO2: 30 mEq/L (ref 19–32)
Calcium: 9 mg/dL (ref 8.4–10.5)
Creatinine, Ser: 0.7 mg/dL (ref 0.50–1.10)
Glucose, Bld: 99 mg/dL (ref 70–99)

## 2012-07-03 MED ORDER — METOPROLOL TARTRATE 12.5 MG HALF TABLET
12.5000 mg | ORAL_TABLET | Freq: Two times a day (BID) | ORAL | Status: DC
  Administered 2012-07-04 – 2012-07-06 (×5): 12.5 mg via ORAL
  Filled 2012-07-03 (×8): qty 1

## 2012-07-03 MED ORDER — POLYSACCHARIDE IRON COMPLEX 150 MG PO CAPS
150.0000 mg | ORAL_CAPSULE | Freq: Every day | ORAL | Status: DC
  Administered 2012-07-03 – 2012-07-06 (×4): 150 mg via ORAL
  Filled 2012-07-03 (×4): qty 1

## 2012-07-03 MED ORDER — AMIODARONE HCL 200 MG PO TABS
200.0000 mg | ORAL_TABLET | Freq: Two times a day (BID) | ORAL | Status: DC
  Administered 2012-07-03 – 2012-07-06 (×6): 200 mg via ORAL
  Filled 2012-07-03 (×8): qty 1

## 2012-07-03 MED ORDER — FUROSEMIDE 40 MG PO TABS
40.0000 mg | ORAL_TABLET | Freq: Two times a day (BID) | ORAL | Status: AC
  Administered 2012-07-03 (×2): 40 mg via ORAL
  Filled 2012-07-03 (×2): qty 1

## 2012-07-03 MED ORDER — FUROSEMIDE 40 MG PO TABS
40.0000 mg | ORAL_TABLET | Freq: Every day | ORAL | Status: DC
  Administered 2012-07-04 – 2012-07-06 (×3): 40 mg via ORAL
  Filled 2012-07-03 (×4): qty 1

## 2012-07-03 MED ORDER — MOVING RIGHT ALONG BOOK
Freq: Once | Status: AC
  Administered 2012-07-03: 10:00:00
  Filled 2012-07-03: qty 1

## 2012-07-03 MED ORDER — POTASSIUM CHLORIDE 10 MEQ/50ML IV SOLN
10.0000 meq | INTRAVENOUS | Status: AC
  Administered 2012-07-03 (×3): 10 meq via INTRAVENOUS

## 2012-07-03 MED ORDER — SODIUM CHLORIDE 0.9 % IJ SOLN: 3.0000 mL | INTRAMUSCULAR | Status: DC | PRN

## 2012-07-03 MED ORDER — POTASSIUM CHLORIDE CRYS ER 20 MEQ PO TBCR
20.0000 meq | EXTENDED_RELEASE_TABLET | Freq: Every day | ORAL | Status: DC
  Administered 2012-07-04 – 2012-07-06 (×3): 20 meq via ORAL
  Filled 2012-07-03 (×3): qty 1

## 2012-07-03 MED ORDER — POTASSIUM CHLORIDE 10 MEQ/50ML IV SOLN
10.0000 meq | INTRAVENOUS | Status: DC | PRN
  Administered 2012-07-03 (×2): 10 meq via INTRAVENOUS
  Filled 2012-07-03: qty 150

## 2012-07-03 MED ORDER — PHENOL 1.4 % MT LIQD
1.0000 | OROMUCOSAL | Status: DC | PRN
  Filled 2012-07-03: qty 177

## 2012-07-03 MED ORDER — FUROSEMIDE 40 MG PO TABS: 40.0000 mg | ORAL_TABLET | Freq: Every day | ORAL | Status: DC

## 2012-07-03 MED ORDER — POTASSIUM CHLORIDE 10 MEQ/50ML IV SOLN
INTRAVENOUS | Status: AC
  Administered 2012-07-03: 10 meq
  Filled 2012-07-03: qty 150

## 2012-07-03 MED ORDER — TRAMADOL HCL 50 MG PO TABS
50.0000 mg | ORAL_TABLET | ORAL | Status: DC | PRN
  Administered 2012-07-05 – 2012-07-06 (×2): 50 mg via ORAL
  Administered 2012-07-06: 100 mg via ORAL
  Filled 2012-07-03: qty 1
  Filled 2012-07-03: qty 2
  Filled 2012-07-03 (×2): qty 1

## 2012-07-03 MED ORDER — SODIUM CHLORIDE 0.9 % IV SOLN: 250.0000 mL | INTRAVENOUS | Status: DC | PRN

## 2012-07-03 MED ORDER — ACETAMINOPHEN 500 MG PO TABS
500.0000 mg | ORAL_TABLET | Freq: Four times a day (QID) | ORAL | Status: DC | PRN
  Administered 2012-07-03 – 2012-07-05 (×6): 500 mg via ORAL
  Filled 2012-07-03: qty 2
  Filled 2012-07-03: qty 1
  Filled 2012-07-03: qty 2
  Filled 2012-07-03: qty 1

## 2012-07-03 MED ORDER — SODIUM CHLORIDE 0.9 % IJ SOLN
3.0000 mL | Freq: Two times a day (BID) | INTRAMUSCULAR | Status: DC
  Administered 2012-07-03 – 2012-07-04 (×3): 3 mL via INTRAVENOUS

## 2012-07-03 MED ORDER — POTASSIUM CHLORIDE CRYS ER 20 MEQ PO TBCR: 20.0000 meq | EXTENDED_RELEASE_TABLET | Freq: Every day | ORAL | Status: DC

## 2012-07-03 MED ORDER — RIVAROXABAN 15 MG PO TABS
15.0000 mg | ORAL_TABLET | Freq: Every day | ORAL | Status: DC
  Administered 2012-07-04 – 2012-07-05 (×2): 15 mg via ORAL
  Filled 2012-07-03 (×3): qty 1

## 2012-07-03 NOTE — Progress Notes (Signed)
                   [address]            [place] [address]          [phone]     5 Days Post-Op Procedure(s) (LRB): MINIMALLY INVASIVE MITRAL VALVE REPAIR (MVR) (Right) MINIMALLY INVASIVE MAZE PROCEDURE (N/A) INTRAOPERATIVE TRANSESOPHAGEAL ECHOCARDIOGRAM (N/A)  Total Length of Stay:  LOS: 5 days  BP 110/45  Pulse 64  Temp(Src) 97.7 F (36.5 C) (Oral)  Resp 21  Ht 5' 6.14" (1.68 m)  Wt 130 lb 11.7 oz (59.3 kg)  BMI 21.01 kg/m2  SpO2 100%  .Intake/Output     03/09 0701 - 03/10 0700 03/10 0701 - 03/11 0700   P.O. 960 630   I.V. (mL/kg) 480 (8.1) 100 (1.7)   IV Piggyback  200   Total Intake(mL/kg) 1440 (24.3) 930 (15.7)   Urine (mL/kg/hr) 1625 (1.1) 602 (1.1)   Chest Tube 150 (0.1)    Total Output 1775 602   Net -335 +328        Stool Occurrence 2 x      . sodium chloride 20 mL/hr at 07/01/12 2121  . sodium chloride       Lab Results  Component Value Date   WBC 9.4 07/03/2012   HGB 8.6* 07/03/2012   HCT 25.0* 07/03/2012   PLT 107* 07/03/2012   GLUCOSE 99 07/03/2012   ALT 20 06/15/2012   AST 26 06/15/2012   NA 137 07/03/2012   K 3.2* 07/03/2012   CL 101 07/03/2012   CREATININE 0.70 07/03/2012   BUN 14 07/03/2012   CO2 30 07/03/2012   TSH 0.652 05/15/2012   INR 1.02 06/15/2012   HGBA1C 6.0* 06/15/2012   Confusion resolved Waiting for 2000 bed  Delight Ovens MD  Beeper [phone] Office [phone] 07/03/2012 3:54 PM

## 2012-07-03 NOTE — Progress Notes (Signed)
AV pacing wires removed per protocol. INR 1.0  Patient instructed to stay in bed X 1 hour. Verbalized understanding.  No changes.

## 2012-07-03 NOTE — Progress Notes (Signed)
Physical Therapy Treatment Patient Details Name: [name] [name] MRN: [id number] DOB: 1940-12-25 Today's Date: 07/03/2012 Time: 4098-1191 PT Time Calculation (min): 24 min  PT Assessment / Plan / Recommendation Comments on Treatment Session  Pt s/p MVR with decr mobility secondarty to decr endurance and decr balance as well as intermittent confusion.  Improved mobility today with pt ambulating greater distance.  Continues with safety issues.      Follow Up Recommendations  SNF;Supervision/Assistance - 24 hour                 Equipment Recommendations  Rolling walker with 5" wheels        Frequency Min 2X/week   Plan Discharge plan remains appropriate;Frequency remains appropriate    Precautions / Restrictions Precautions Precautions: Fall Restrictions Weight Bearing Restrictions: No   Pertinent Vitals/Pain VSS, No pain    Mobility  Bed Mobility Bed Mobility: Sit to Supine Rolling Left: Not tested (comment) Left Sidelying to Sit: Not tested (comment) Sitting - Scoot to Edge of Bed: Not tested (comment) Sit to Supine: 4: Min assist;HOB elevated Transfers Transfers: Sit to Stand;Stand to Sit Sit to Stand: 4: Min assist;With upper extremity assist;With armrests;From chair/3-in-1 Stand to Sit: 4: Min assist;With upper extremity assist;To bed Details for Transfer Assistance: cues for hand placement and safety as pt impulsive and moving quickly.   Ambulation/Gait Ambulation/Gait Assistance: 4: Min assist Ambulation Distance (Feet): 600 Feet Assistive device: Other (Comment) (wheelchair for equipment (pushed wheelchair)) Ambulation/Gait Assistance Details: Pt needed assist and cues to steer wheelchair and stay close to wheelchair.  Pt with decr safety awareness with ambulation.   Gait Pattern: Step-through pattern;Decreased stride length;Trunk flexed Gait velocity: decreased Stairs: No Wheelchair Mobility Wheelchair Mobility: No    PT Goals Acute Rehab PT Goals PT  Goal: Sit to Supine/Side - Progress: Progressing toward goal PT Goal: Sit to Stand - Progress: Progressing toward goal PT Goal: Ambulate - Progress: Progressing toward goal  Visit Information  Last PT Received On: 07/03/12 Assistance Needed: +1    Subjective Data  Subjective: Pt cognition improving though still confused at times.     Cognition  Cognition Overall Cognitive Status: Impaired Area of Impairment: Memory;Safety/judgement;Awareness of errors;Awareness of deficits;Problem solving Arousal/Alertness: Awake/alert Orientation Level: Disoriented to;Time Behavior During Session: Anxious Safety/Judgement: Decreased awareness of need for assistance;Decreased safety judgement for tasks assessed Awareness of Errors: Assistance required to identify errors made;Assistance required to correct errors made Awareness of Errors - Other Comments: Pt with improving safety awareness    Balance  Static Sitting Balance Static Sitting - Balance Support: Feet supported;No upper extremity supported Static Sitting - Level of Assistance: 5: Stand by assistance Static Sitting - Comment/# of Minutes: supervision for safety with sitting Static Standing Balance Static Standing - Balance Support: Bilateral upper extremity supported;During functional activity Static Standing - Level of Assistance: 4: Min assist Static Standing - Comment/# of Minutes: Needs steadying assist to stand statically with device.    End of Session PT - End of Session Equipment Utilized During Treatment: Gait belt;Oxygen Activity Tolerance: Patient limited by fatigue Patient left: in bed;with call bell/phone within reach;with nursing in room Nurse Communication: Mobility status       INGOLD,DAWN 07/03/2012, 10:24 AM  [facility] [phone] (pager)

## 2012-07-03 NOTE — Progress Notes (Signed)
   CARDIOTHORACIC SURGERY PROGRESS NOTE   R5 Days Post-Op Procedure(s) (LRB): MINIMALLY INVASIVE MITRAL VALVE REPAIR (MVR) (Right) MINIMALLY INVASIVE MAZE PROCEDURE (N/A) INTRAOPERATIVE TRANSESOPHAGEAL ECHOCARDIOGRAM (N/A)  Subjective: Looks very good.  Delirium has completely resolved and patient is back to baseline.  Eating well.  Denies SOB.  + mild soreness right chest.  Objective: Vital signs: BP Readings from Last 1 Encounters:  07/03/12 101/57   Pulse Readings from Last 1 Encounters:  07/03/12 90   Resp Readings from Last 1 Encounters:  07/03/12 16   Temp Readings from Last 1 Encounters:  07/03/12 97.8 F (36.6 C) Oral    Hemodynamics:    Physical Exam:  Rhythm:   sinus  Breath sounds: clear  Heart sounds:  RRR w/out murmur  Incisions:  Clean and dry  Abdomen:  soft  Extremities:  warm   Intake/Output from previous day: 03/09 0701 - 03/10 0700 In: 1440 [P.O.:960; I.V.:480] Out: 1775 [Urine:1625; Chest Tube:150] Intake/Output this shift: Total I/O In: 70 [I.V.:20; IV Piggyback:50] Out: 75 [Urine:75]  Lab Results:  Recent Labs  07/01/12 0435 07/03/12 0433  WBC 11.8* 9.4  HGB 7.5* 8.6*  HCT 21.8* 25.0*  PLT 67* 107*   BMET:  Recent Labs  07/01/12 0435 07/03/12 0433  NA 138 137  K 3.6 3.2*  CL 106 101  CO2 30 30  GLUCOSE 87 99  BUN 13 14  CREATININE 0.73 0.70  CALCIUM 8.7 9.0    CBG (last 3)  No results found for this basename: GLUCAP,  in the last 72 hours ABG    Component Value Date/Time   PHART 7.310* 06/29/2012 0230   HCO3 21.3 06/29/2012 0230   TCO2 24 06/29/2012 1707   ACIDBASEDEF 5.0* 06/29/2012 0230   O2SAT 96.0 06/29/2012 0230   CXR: *RADIOLOGY REPORT*  Clinical Data: Minimally invasive mitral valve repair, follow-up  PORTABLE CHEST - 1 VIEW  Comparison: Portable chest x-ray of 07/02/2012  Findings: Opacity remains at the left lung base most consistent  with atelectasis, pneumonia and/or effusion. The right lung is  clear.  Cardiomegaly is stable. Deviation of the tracheal air  shadow to the left of midline is again noted most consistent with  enlarged right lobe of thyroid. The left central venous line is  unchanged and right chest tube remains. A tiny right apical  pneumothorax is now present.  IMPRESSION:  1. Tiny right apical pneumothorax with right chest tube remaining.  2. Persistent opacity at the left lung base consistent with  atelectasis, effusion, and/or pneumonia.  3. No change in probable enlarged right lobe thyroid.  Original Report Authenticated By: Dwyane Dee, M.D.   Assessment/Plan: S/P Procedure(s) (LRB): MINIMALLY INVASIVE MITRAL VALVE REPAIR (MVR) (Right) MINIMALLY INVASIVE MAZE PROCEDURE (N/A) INTRAOPERATIVE TRANSESOPHAGEAL ECHOCARDIOGRAM (N/A)  Overall doing quite well POD5 Post op delirium has resolved Expected post op acute blood loss anemia, mild, stable Expected post op volume excess, mild, diuresing Maintaining NSR s/p maze Hypokalemia, diuretic induced   D/C pacing wires  D/C chest tubes  D/C central line  D/C foley  Transfer 2000  Possible d/c to SNF in 1-2 days  Restart Xarelto  Mykael Trott H 07/03/2012 9:05 AM

## 2012-07-03 NOTE — Progress Notes (Signed)
UR Completed.  [name] [name] [phone] 07/03/2012  

## 2012-07-04 ENCOUNTER — Inpatient Hospital Stay (HOSPITAL_COMMUNITY): Payer: Medicare Other

## 2012-07-04 NOTE — Progress Notes (Signed)
   CARDIOTHORACIC SURGERY PROGRESS NOTE   R6 Days Post-Op Procedure(s) (LRB): MINIMALLY INVASIVE MITRAL VALVE REPAIR (MVR) (Right) MINIMALLY INVASIVE MAZE PROCEDURE (N/A) INTRAOPERATIVE TRANSESOPHAGEAL ECHOCARDIOGRAM (N/A)  Subjective: Feels well.  Mild soreness in chest.  Objective: Vital signs: BP Readings from Last 1 Encounters:  07/04/12 129/55   Pulse Readings from Last 1 Encounters:  07/04/12 65   Resp Readings from Last 1 Encounters:  07/04/12 23   Temp Readings from Last 1 Encounters:  07/04/12 98 F (36.7 C) Oral    Hemodynamics:    Physical Exam:  Rhythm:   sinus  Breath sounds: clear  Heart sounds:  RRR w/out murmur  Incisions:  Clean and dry  Abdomen:  soft  Extremities:  warm   Intake/Output from previous day: 03/10 0701 - 03/11 0700 In: 1150 [P.O.:850; I.V.:100; IV Piggyback:200] Out: 2207 [Urine:2207] Intake/Output this shift:    Lab Results:  Recent Labs  07/03/12 0433  WBC 9.4  HGB 8.6*  HCT 25.0*  PLT 107*   BMET:  Recent Labs  07/03/12 0433  NA 137  K 3.2*  CL 101  CO2 30  GLUCOSE 99  BUN 14  CREATININE 0.70  CALCIUM 9.0    CBG (last 3)  No results found for this basename: GLUCAP,  in the last 72 hours ABG    Component Value Date/Time   PHART 7.310* 06/29/2012 0230   HCO3 21.3 06/29/2012 0230   TCO2 24 06/29/2012 1707   ACIDBASEDEF 5.0* 06/29/2012 0230   O2SAT 96.0 06/29/2012 0230   CXR: *RADIOLOGY REPORT*  Clinical Data: Shortness of breath and right chest pain.  CHEST - 2 VIEW  Comparison: 07/03/2012  Findings: Again noted is a small right apical pneumothorax that has  not significantly changed. In addition, the patient has a small  left apical pneumothorax which is also unchanged. There are  persistent densities in the left lung base but slightly improved  aeration in this area. Findings could represent airspace disease  or atelectasis. Blunting at the costophrenic angles suggests small  effusions. Changes  consistent with a mitral valve repair.  Epicardial pacer wires been removed. The heart size is stable.  IMPRESSION:  Improving aeration at the left lung base. There are residual  densities within the left lower lobe.  Small pleural effusions.  Small bilateral apical pneumothoraces. The pneumothoraces are  stable in size.  Original Report Authenticated By: Richarda Overlie, M.D.   Assessment/Plan: S/P Procedure(s) (LRB): MINIMALLY INVASIVE MITRAL VALVE REPAIR (MVR) (Right) MINIMALLY INVASIVE MAZE PROCEDURE (N/A) INTRAOPERATIVE TRANSESOPHAGEAL ECHOCARDIOGRAM (N/A)  Progressing well Still waiting for bed on 2000 for transfer Possible d/c to SNF in 1-2 days   OWEN,CLARENCE H 07/04/2012 8:16 AM

## 2012-07-04 NOTE — Plan of Care (Signed)
Problem: Phase III Progression Outcomes Goal: Time patient transferred to PCTU/Telemetry POD 1115--transfer to  2006 via wheelchair--portable monitor on.  No changes.

## 2012-07-05 ENCOUNTER — Other Ambulatory Visit: Payer: Self-pay | Admitting: *Deleted

## 2012-07-05 DIAGNOSIS — I059 Rheumatic mitral valve disease, unspecified: Secondary | ICD-10-CM

## 2012-07-05 LAB — HEPARIN INDUCED THROMBOCYTOPENIA PNL
Heparin Induced Plt Ab: NEGATIVE
UFH Low Dose 0.1 IU/mL: 0 % Release

## 2012-07-05 LAB — BASIC METABOLIC PANEL
CO2: 29 mEq/L (ref 19–32)
Calcium: 9.4 mg/dL (ref 8.4–10.5)
Chloride: 100 mEq/L (ref 96–112)
Glucose, Bld: 100 mg/dL — ABNORMAL HIGH (ref 70–99)
Potassium: 3.6 mEq/L (ref 3.5–5.1)
Sodium: 137 mEq/L (ref 135–145)

## 2012-07-05 MED ORDER — TRAMADOL HCL 50 MG PO TABS: 50.0000 mg | ORAL_TABLET | ORAL | Status: DC | PRN

## 2012-07-05 MED ORDER — DSS 100 MG PO CAPS: 200.0000 mg | ORAL_CAPSULE | Freq: Two times a day (BID) | ORAL | Status: DC | PRN

## 2012-07-05 MED ORDER — METOPROLOL TARTRATE 25 MG PO TABS: 12.5000 mg | ORAL_TABLET | Freq: Two times a day (BID) | ORAL | Status: DC

## 2012-07-05 NOTE — Progress Notes (Addendum)
7 Days Post-Op Procedure(s) (LRB): MINIMALLY INVASIVE MITRAL VALVE REPAIR (MVR) (Right) MINIMALLY INVASIVE MAZE PROCEDURE (N/A) INTRAOPERATIVE TRANSESOPHAGEAL ECHOCARDIOGRAM (N/A) Subjective:  Ms. Grinnell complains of pain this morning.  States she did not get much sleep because the pain kept waking her up.  Objective: Vital signs in last 24 hours: Temp:  [97.8 F (36.6 C)-98.5 F (36.9 C)] 98.1 F (36.7 C) (03/12 0413) Pulse Rate:  [59-83] 59 (03/12 0413) Cardiac Rhythm:  [-] Heart block (03/11 1935) Resp:  [17-19] 18 (03/12 0413) BP: (94-120)/(35-67) 117/41 mmHg (03/12 0413) SpO2:  [97 %-100 %] 97 % (03/12 0413) Weight:  [128 lb 14.4 oz (58.469 kg)] 128 lb 14.4 oz (58.469 kg) (03/12 0413)  Intake/Output from previous day: 03/11 0701 - 03/12 0700 In: 843 [P.O.:840; I.V.:3] Out: 501 [Urine:501] Intake/Output this shift: Total I/O In: 240 [P.O.:240] Out: -   General appearance: alert, cooperative and no distress Heart: regular rate and rhythm Lungs: clear to auscultation bilaterally Abdomen: soft, non-tender; bowel sounds normal; no masses,  no organomegaly Extremities: edema trace Wound: clean and dry  Lab Results:  Recent Labs  07/03/12 0433  WBC 9.4  HGB 8.6*  HCT 25.0*  PLT 107*   BMET:  Recent Labs  07/03/12 0433 07/05/12 0620  NA 137 137  K 3.2* 3.6  CL 101 100  CO2 30 29  GLUCOSE 99 100*  BUN 14 12  CREATININE 0.70 0.78  CALCIUM 9.0 9.4    PT/INR: No results found for this basename: LABPROT, INR,  in the last 72 hours ABG    Component Value Date/Time   PHART 7.310* 06/29/2012 0230   HCO3 21.3 06/29/2012 0230   TCO2 24 06/29/2012 1707   ACIDBASEDEF 5.0* 06/29/2012 0230   O2SAT 96.0 06/29/2012 0230   CBG (last 3)  No results found for this basename: GLUCAP,  in the last 72 hours  Assessment/Plan: S/P Procedure(s) (LRB): MINIMALLY INVASIVE MITRAL VALVE REPAIR (MVR) (Right) MINIMALLY INVASIVE MAZE PROCEDURE (N/A) INTRAOPERATIVE TRANSESOPHAGEAL  ECHOCARDIOGRAM (N/A)  1. CV- NSR 2. Pulm- no acute issues, continue IS 3. Rena- creatinine stable, mildly volume overloaded 4. Dispo- patient making progress, not feeling well this morning, will plan for SNF tomorrow   LOS: 7 days    BARRETT, ERIN 07/05/2012   I have seen and examined the patient and agree with the assessment and plan as outlined.  OWEN,CLARENCE H 07/05/2012 9:45am

## 2012-07-05 NOTE — Discharge Summary (Signed)
Physician Discharge Summary  Patient ID: [name] [name] MRN: [id number] DOB/AGE: 08-28-1940 72 y.o.  Admit date: 06/28/2012 Discharge date: 07/05/2012  Admission Diagnoses:  Patient Active Problem List  Diagnosis  . THYROID NODULE  . HYPERLIPIDEMIA  . MITRAL VALVE PROLAPSE  . HEMORRHOIDS  . DIVERTICULOSIS, COLON  . CARDIAC MURMUR  . Diverticulitis  . Nausea  . Hypertension  . Hypokalemia  . Anxiety  . Atrial fibrillation  . Systolic CHF, acute  . Mitral regurgitation  . Acute systolic heart failure  . Refusal of blood transfusions as patient is Jehovah's Witness  . Severe mitral regurgitation   Discharge Diagnoses:   Patient Active Problem List  Diagnosis  . THYROID NODULE  . HYPERLIPIDEMIA  . MITRAL VALVE PROLAPSE  . HEMORRHOIDS  . DIVERTICULOSIS, COLON  . CARDIAC MURMUR  . Diverticulitis  . Nausea  . Hypertension  . Hypokalemia  . Anxiety  . Atrial fibrillation  . Systolic CHF, acute  . Mitral regurgitation  . Acute systolic heart failure  . Refusal of blood transfusions as patient is Jehovah's Witness  . Severe mitral regurgitation  . S/P mitral valve repair  . 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  She has a known history of mitral valve prolapse and mitral regurgitation and has been routinely followed since diagnosis.  The patient states that she has been experiencing mild exertional shortness of breath for a while.  She states this initially improved when she became physically active.  However, approximately one month ago she developed worsening shortness of breath, palpitations worsened with exercise and a persistent dry cough.  She presented to her PCP with these complaints at which time CXR was obtained and the patient was suspected to have pneumonia.  She was treated with antibiotics.  She later presented for follow up at  [facility] heart care at which time EKG revealed the patient to be in Atrial Fibrillation and CHF resulting in subsequent admission to the hospital.  Repeat CXR showed evidence of a right pleural effusion which was treated with IV diuresis.  Echocardiogram obtained during that stay showed severe prolapse involving the P 2 scallop of the posterior leaflet with severe regurgitation. Her LV function was also mildly reduced.  Cardiac surgery was consulted and the patient evaluated by Dr. Cornelius Moras for possible intervention on her Mitral Valve.  At that time the patient was already suffering from anemia and did not wish to receive any blood products.  It was felt that she may require a delay in surgery scheduling until her blood count can recover.  She had also yet to undergo cardiac catheterization.  It was felt at that time that the patient should undergo cardiac catheterization prior to proceeding with surgery.  This was performed on 05/22/2012 and did not show any evidence of CAD.  Dr. Cornelius Moras again met with the patient and her family and discussed at length the risks and benefits of surgical intervention.  The patient was agreeable to proceed with surgery.  However, she wished to post pone several weeks in order for her anemia to improve.  She also wanted some more time to clarify what she would want in regards to use of blood products, however she states she doesn't think she would be willing to receive a blood transfusion.  She was discharged home prior to surgery.  She presented for follow up  with Dr. Cornelius Moras on 06/12/2012 at which time the risks and benefits of surgery were once again explained to the patient.  She was agreeable to proceed.  She also made final decisions regarding use of blood products and the patient stated that she does not want to receive any human blood products even in the even of a life threatening situation.    Hospital Course:   The patient presented to [facility] on 06/28/2012.  She was  taken to the operating room and underwent a Minimally Invasive Complex Mitral Valve Repair (procedure pasted below) and a Complete MAZE procedure.  She tolerated the procedure well and was taken to the SICU in stable condition.  The patient was extubated early in the morning the day after surgery.  During her stay in the ICU she was weaned off all cardiac drips.  She had some issues with confusion and anxiety.  Her narcotic pain medication was removed and these symptoms improved.  She was bradycardic post operatively and required back up pacing.  Her chest tube and arterial lines were removed without difficulty.  She remained volume overloaded and was aggressively diuresed with good results.  Once medically stable her pacing wires removed and she was transferred to the telemetry unit in stable condition.  The patient continues to progress.  She is maintaining NSR and will remain on Amiodarone and Xarelto which she was taking preoperatively.  Her Hgb is slowly trending up, currently around 8.6.  The patient is on iron supplementation, however due to her Jehovah's witness faith she refuses all blood products.  She is ambulating with assistance and is tolerating a cardiac diet.  The patient is medically stable at this time.  Per physical therapy recommendations we will anticipate placement to SNF in the next 24-48 hours.  She will follow up at [facility] on 07/17/2012 at 2:00pm with a CXR prior to her appointment.  She will also need to follow up with her Cardiologist in 2-4 weeks.      Significant Diagnostic Studies:   LV EF: 45%  ------------------------------------------------------------ Indications: Atrial fibrillation - 427.31. Mitral valve prolapse 424.0.  ------------------------------------------------------------ Study Conclusions  - Left ventricle: The estimated ejection fraction was 45%. - Aorta: The aorta was moderately calcified. - Mitral valve: Severe prolapse, involving theP2 scallop of the  posterior leaflet. Cannot exclude perforation of leaflet between P2 and P3 segment. Severe regurgitation, with multiple jets directed eccentrically. - Left atrium: The atrium was severely dilated. No evidence of thrombus in the atrial cavity or appendage. - Right atrium: The atrium was mildly dilated. - Atrial septum: No defect or patent foramen ovale was identified. Echo contrast study showed no right-to-left atrial level shunt, following an increase in RA pressure induced by provocative maneuvers. - Tricuspid valve: No evidence of vegetation.  Treatments: surgery:   Minimally-Invasive Mitral Valve Repair Complex valvuloplasty including quadrangular resection of posterior leaflet with sliding leaflet plasty  Gore-tex neocord placement x2  Sorin Memo 3D Ring Annuloplasty (size 32mm, catalog [id number], serial [id number])  Maze Procedure Complete biatrial lesion set using cryothermy  Obliteration of Left Atrial Appendage  Disposition: 01-Home or Self Care  The patient has been discharged on:   1.Beta Blocker:  Yes [  x ]                              No   [   ]  If No, reason:  2.Ace Inhibitor/ARB: Yes [   ]                                     No  [  x  ]                                     If No, reason: Labile blood pressure  3.Statin:   Yes [   ]                  No  [ x  ]                  If No, reason: No CAD, No Hyperlipidemia  4.Marlowe Kays:  Yes  [ ]                   No   [  x ]                  If No, reason: on Allergy        Medication List    STOP taking these medications       diltiazem 120 MG 24 hr capsule  Commonly known as:  CARDIZEM CD      TAKE these medications       acetaminophen 500 MG tablet  Commonly known as:  TYLENOL  Take 500 mg by mouth every 6 (six) hours as needed. For pain     ALPRAZolam 0.25 MG tablet  Commonly known as:  XANAX  Take 0.25 mg by mouth 3 (three) times daily as needed for sleep. For anxiety      amiodarone 200 MG tablet  Commonly known as:  PACERONE  Take 1 tablet (200 mg total) by mouth 2 (two) times daily.     DSS 100 MG Caps  Take 200 mg by mouth 2 (two) times daily as needed for constipation.     furosemide 20 MG tablet  Commonly known as:  LASIX  Take 20 mg by mouth daily after breakfast.     iron polysaccharides 150 MG capsule  Commonly known as:  NIFEREX  Take 150 mg by mouth daily after lunch.     metoprolol tartrate 25 MG tablet  Commonly known as:  LOPRESSOR  Take 0.5 tablets (12.5 mg total) by mouth 2 (two) times daily.     potassium chloride SA 20 MEQ tablet  Commonly known as:  K-DUR,KLOR-CON  Take 1 tablet (20 mEq total) by mouth daily.     PROBIOTIC DAILY PO  Take 1 capsule by mouth daily.     Rivaroxaban 15 MG Tabs tablet  Commonly known as:  XARELTO  Take 1 tablet (15 mg total) by mouth daily with supper.     traMADol 50 MG tablet  Commonly known as:  ULTRAM  Take 1-2 tablets (50-100 mg total) by mouth every 4 (four) hours as needed.           Follow-up Information   Follow up with Purcell Nails, MD On 07/17/2012. (Appointment is at 2:00 pm)    Contact information:   [address] [employer] [address] [place] [address] [phone]       Follow up with [facility] On 07/17/2012. (Please get CXR 1 hour prior to appointment with Dr. Cornelius Moras)  Contact information:   [facility]       Schedule an appointment as soon as possible for a visit with MCALHANY,CHRISTOPHER, MD. (Please contact office to set up 2-4 week follow up appointment)    Contact information:   [address] [place] [address] [phone]       Signed: Lowella Dandy 07/05/2012, 1:04 PM

## 2012-07-05 NOTE — Progress Notes (Signed)
CARDIAC REHAB PHASE I   PRE:  Rate/Rhythm: 68 SR  BP:  Supine:   Sitting: 102/54  Standing:    SaO2: 93 RA  MODE:  Ambulation: 400 ft   POST:  Rate/Rhythm:   BP:  Supine:   Sitting: 100/50  Standing:    SaO2: 97 RA 1110-1200 Pt in bed on arrival. Pt very reluctant to walk, states that she os not feeling well. Explained to her that she was going to have some good and bad days, but she needs to get OOB and to ambulate. Assisted X 1 and used walker to ambulate. Gait steady with walker. VS stable. Pt needs a lot of encouragement to walk and stay OOB. Pt to recliner after walk with call light in reach.  Melina Copa RN 07/05/2012 11:59 AM

## 2012-07-05 NOTE — Progress Notes (Signed)
Pt ambulated 350 ft in hallway with RN. Pt tolerated well, back in bed resting.

## 2012-07-06 ENCOUNTER — Encounter (HOSPITAL_COMMUNITY): Payer: Self-pay | Admitting: *Deleted

## 2012-07-06 NOTE — Progress Notes (Signed)
Physical Therapy Treatment Patient Details Name: [name] [name] MRN: [id number] DOB: 1940-08-02 Today's Date: 07/06/2012 Time: 1191-4782 PT Time Calculation (min): 33 min  PT Assessment / Plan / Recommendation Comments on Treatment Session  Pt s/p MVR with decr mobility secondarty to decr endurance and decr balance as well as intermittent confusion.  Improved mobility today with pt ambulating greater distance.  Continues with safety issues as well but is improving with this as well.  Plan to d/c to NH today.        Follow Up Recommendations  SNF;Supervision/Assistance - 24 hour                 Equipment Recommendations  Rolling walker with 5" wheels        Frequency Min 2X/week   Plan Discharge plan remains appropriate;Frequency remains appropriate    Precautions / Restrictions Precautions Precautions: Fall Restrictions Weight Bearing Restrictions: No   Pertinent Vitals/Pain VSS, No pain    Mobility  Bed Mobility Bed Mobility: Supine to Sit;Sit to Supine Rolling Left: Not tested (comment) Left Sidelying to Sit: Not tested (comment) Supine to Sit: 7: Independent Sitting - Scoot to Edge of Bed: 7: Independent Sit to Supine: 7: Independent Details for Bed Mobility Assistance: Pt still impulsive but improved safety.  Verblized to PT that she needed to not use her UEs with bed mobility and able to compensate for not using UEs.   Transfers Transfers: Sit to Stand;Stand to Sit Sit to Stand: 4: Min guard;From bed;Without upper extremity assist Stand to Sit: 4: Min guard;Without upper extremity assist;To bed Details for Transfer Assistance: good safety with sit to stand to sit. Ambulation/Gait Ambulation/Gait Assistance: 4: Min guard Ambulation Distance (Feet): 600 Feet Assistive device: Rolling walker Ambulation/Gait Assistance Details: Pt easily distracted with talking while walking.  Pt needs min guard assist for safety even with RW.   Gait Pattern: Step-through  pattern;Decreased step length - right;Decreased stride length Gait velocity: decreased Stairs: No Wheelchair Mobility Wheelchair Mobility: No     PT Goals Acute Rehab PT Goals PT Goal: Supine/Side to Sit - Progress: Met PT Goal: Sit to Supine/Side - Progress: Met PT Goal: Sit to Stand - Progress: Progressing toward goal PT Goal: Ambulate - Progress: Progressing toward goal  Visit Information  Last PT Received On: 07/06/12 Assistance Needed: +1    Subjective Data  Subjective: "I still feel unsteady sometimes."   Cognition  Cognition Overall Cognitive Status: Impaired Area of Impairment: Memory;Safety/judgement;Awareness of errors;Awareness of deficits;Problem solving Arousal/Alertness: Awake/alert Orientation Level: Disoriented to;Time Behavior During Session: Anxious Safety/Judgement: Decreased awareness of need for assistance;Decreased safety judgement for tasks assessed Awareness of Errors: Assistance required to identify errors made;Assistance required to correct errors made Awareness of Errors - Other Comments: Pt with improving safety awareness    Balance  Balance Balance Assessed: Yes Static Sitting Balance Static Sitting - Balance Support: Feet supported;No upper extremity supported Static Sitting - Level of Assistance: 7: Independent Static Standing Balance Static Standing - Balance Support: Bilateral upper extremity supported;During functional activity Static Standing - Level of Assistance: 4: Min assist Static Standing - Comment/# of Minutes: Needs steadying assist to stand statically with device secondary to pt easily distracted thus decreasing her attention to task.   Standardized Balance Assessment Standardized Balance Assessment: Dynamic Gait Index Dynamic Gait Index Level Surface: Normal Change in Gait Speed: Mild Impairment Gait with Horizontal Head Turns: Normal Gait with Vertical Head Turns: Normal Gait and Pivot Turn: Mild Impairment Step Over  Obstacle: Mild Impairment  Step Around Obstacles: Moderate Impairment Steps: Moderate Impairment Total Score: 17 High Level Balance High Level Balance Comments: Scored 17/24 on DGI suggesting pt at risk for falls.    End of Session PT - End of Session Equipment Utilized During Treatment: Gait belt Activity Tolerance: Patient tolerated treatment well Patient left: in bed;with call bell/phone within reach;with nursing in room Nurse Communication: Mobility status        INGOLD,DAWN 07/06/2012, 10:53 AM  [facility] [phone] (pager)

## 2012-07-06 NOTE — Progress Notes (Addendum)
[id number] Cardiac Rehab Completed discharge education with pt. She voices understanding. Pt agrees to Outpt. CRP in [place], will send referral. Melina Copa RN

## 2012-07-06 NOTE — Clinical Social Work Note (Signed)
CSW following for dc planning. Pt is ready and SNF bed ready. We are waiting on insurance authorization for SNF. Additional clinical information was sent on Pt's behalf.  SNF updated on process. Pt's daughter will arrange transport with friend/family member.   Frederico Hamman, LCSW [phone]

## 2012-07-06 NOTE — Progress Notes (Addendum)
8 Days Post-Op Procedure(s) (LRB): MINIMALLY INVASIVE MITRAL VALVE REPAIR (MVR) (Right) MINIMALLY INVASIVE MAZE PROCEDURE (N/A) INTRAOPERATIVE TRANSESOPHAGEAL ECHOCARDIOGRAM (N/A) Subjective:  [name] [name] states she is feeling better this morning.  She has no new complaints  Objective: Vital signs in last 24 hours: Temp:  [97.7 F (36.5 C)-98.1 F (36.7 C)] 98.1 F (36.7 C) (03/13 0622) Pulse Rate:  [62-102] 63 (03/13 0622) Cardiac Rhythm:  [-] Heart block (03/12 2100) Resp:  [18-20] 20 (03/13 0622) BP: (88-115)/(39-65) 115/65 mmHg (03/13 0622) SpO2:  [93 %-98 %] 93 % (03/13 0622) Weight:  [126 lb 12.8 oz (57.516 kg)] 126 lb 12.8 oz (57.516 kg) (03/13 1610)  Intake/Output from previous day: 03/12 0701 - 03/13 0700 In: 720 [P.O.:720] Out: 1300 [Urine:1300]  General appearance: alert, cooperative and no distress Heart: regular rate and rhythm Lungs: clear to auscultation bilaterally Abdomen: soft, non-tender; bowel sounds normal; no masses,  no organomegaly Extremities: extremities normal, atraumatic, no cyanosis or edema Wound: clean and dry  Lab Results: No results found for this basename: WBC, HGB, HCT, PLT,  in the last 72 hours BMET:  Recent Labs  07/05/12 0620  NA 137  K 3.6  CL 100  CO2 29  GLUCOSE 100*  BUN 12  CREATININE 0.78  CALCIUM 9.4    PT/INR: No results found for this basename: LABPROT, INR,  in the last 72 hours ABG    Component Value Date/Time   PHART 7.310* 06/29/2012 0230   HCO3 21.3 06/29/2012 0230   TCO2 24 06/29/2012 1707   ACIDBASEDEF 5.0* 06/29/2012 0230   O2SAT 96.0 06/29/2012 0230   CBG (last 3)  No results found for this basename: GLUCAP,  in the last 72 hours  Assessment/Plan: S/P Procedure(s) (LRB): MINIMALLY INVASIVE MITRAL VALVE REPAIR (MVR) (Right) MINIMALLY INVASIVE MAZE PROCEDURE (N/A) INTRAOPERATIVE TRANSESOPHAGEAL ECHOCARDIOGRAM (N/A)  1. CV- NSR on Amiodarone, Lopressor, Xarelto 2. Pulm- no acute issues, IS at discharge 3.  Deconditioning- PT recs SNF 4. Dispo- patient stable, hopefully to SNF today   LOS: 8 days    [name], [name] 07/06/2012  I have seen and examined the patient and agree with the assessment and plan as outlined.  Ready for d/c today.  [name],[name] 07/06/2012 10:54 AM

## 2012-07-10 NOTE — Clinical Social Work Placement (Signed)
Late entry 07/10/12 for 07/06/12  Clinical Social Work Department CLINICAL SOCIAL WORK PLACEMENT NOTE 07/10/2012  Patient:  [name] [name], [name] [name]  Account Number:  [id number] Admit date:  06/28/2012  Clinical Social Worker:  Salomon Fick, LCSW  Date/time:  06/29/2012 04:20 PM  Clinical Social Work is seeking post-discharge placement for this patient at the following level of care:   SKILLED NURSING   (*CSW will update this form in Epic as items are completed)   06/29/2012  Patient/family provided with [facility] Department of Clinical Social Work's list of facilities offering this level of care within the geographic area requested by the patient (or if unable, by the patient's family).  06/29/2012  Patient/family informed of their freedom to choose among providers that offer the needed level of care, that participate in Medicare, Medicaid or managed care program needed by the patient, have an available bed and are willing to accept the patient.  06/29/2012  Patient/family informed of [facility]' ownership interest in [facility], as well as of the fact that they are under no obligation to receive care at this facility.  PASARR submitted to [employer] on 07/06/2012 PASARR number received from [employer] on 07/06/2012  FL2 transmitted to all facilities in geographic area requested by pt/family on  06/29/2012 FL2 transmitted to all facilities within larger geographic area on 06/29/2012  Patient informed that his/her managed care company has contracts with or will negotiate with  certain facilities, including the following:     Patient/family informed of bed offers received:  07/06/2012 Patient chooses bed at [facility] Physician recommends and patient chooses bed at    Patient to be transferred to [facility] on  07/06/2012 Patient to be transferred to facility by family  The following physician request were entered in Epic:   Additional Comments: CSW received insurance auth for Pt  to dc to [facility]. Pt excited and has arranged for [name] friend of the family to take her there after 5pm. DC info and pasarr sent to SNF. No further CSW needs at this time.  Frederico Hamman, LCSW [phone]

## 2012-07-11 ENCOUNTER — Non-Acute Institutional Stay (SKILLED_NURSING_FACILITY): Payer: Medicare Other | Admitting: Internal Medicine

## 2012-07-11 DIAGNOSIS — K5900 Constipation, unspecified: Secondary | ICD-10-CM

## 2012-07-11 DIAGNOSIS — D62 Acute posthemorrhagic anemia: Secondary | ICD-10-CM

## 2012-07-11 DIAGNOSIS — I4891 Unspecified atrial fibrillation: Secondary | ICD-10-CM

## 2012-07-11 DIAGNOSIS — I059 Rheumatic mitral valve disease, unspecified: Secondary | ICD-10-CM

## 2012-07-13 NOTE — Progress Notes (Shared)
Patient ID: [name] [name], female   DOB: [birth year], 72 y.o.   MRN: [id number]  [facility] H&P 07/11/2012  ALLERGIES/INTOLERANCES:    Sulfa drugs.   Aspirin.   Naproxen.   CHIEF COMPLAINT:   Manage mitral valve prolapse, severe mitral regurgitation, atrial fibrillation and acute blood loss anemia.    HISTORY OF PRESENT ILLNESS:  72 year old, Caucasian female was having known history of mitral valve prolapse and mild mitral regurgitation.  She was being routinely followed up.   She had been experiencing mild exertional shortness of breath for a while.  However, about a month ago, she had worsening shortness of breath and palpitations worsened by exercise, and a persistently dry cough.  When she presented to the cardiologist, EKG revealed atrial fibrillation and CHF, resulting in admission to the hospital.  Chest x-ray showed right pleural effusion and she was treated with IV diuresis.  A 2D-echo showed severe prolapse involving the p2 scallop of the posterior leaflet with severe regurgitation.  LV function was mildly reduced.  Cardiac Surgery was consulted and she subsequently underwent invasive complex mitral valve repair and a complete MAZE procedure.  She tolerated the procedure well.  After hospitalization, she is admitted to this facility for short-term rehabilitation.  She is complaining of increased soreness of right side of the chest.  She denies shortness of breath or palpitations.    Atrial fibrillation.  This is a new diagnosis.  She is currently on metoprolol and amiodarone.  She is tolerating both medications without any problems.  She denies chest pain, shortness of breath or palpitations.    Acute blood loss anemia.  Last hemoglobin level was 8.6.  Patient was placed on iron.  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  She denies fatigue, melena or hematochezia.  PAST MEDICAL HISTORY:    Hypertension.   Anxiety.   Mitral valve prolapse.    Hyperlipidemia.   Goiter.  GERD.   Aortic stenosis.    Atrial fibrillation.    Mitral regurgitation.   CHF, EF of 40%.   Chronic kidney disease.    Migraine headaches.   PAST SURGICAL HISTORY:    Uterine fibroid removal.    Ovarian cyst removal.   Tubal ligation.    FAMILY HISTORY:   FATHER:  Father had colon cancer.    SOCIAL HISTORY:   TOBACCO USE:  Denies tobacco use.   ILLICIT DRUGS:  Denies illicit drug use.  ALCOHOL:  She has one alcoholic drink a month.    CURRENT MEDICATIONS:  Per MAR.   REVIEW OF SYSTEMS:  See HPI.  Otherwise, 14-point review of systems is negative.   PHYSICAL EXAMINATION:   VITAL SIGNS:   O2 SATURATIONS:  97% room air.   RESPIRATIONS:  18.  TEMPERATURE:  97.6.   BLOOD PRESSURE:  105/56.  PULSE:  69.   GENERAL APPEARANCE:  No acute distress.  Well-nourished.  Normal body habitus.   EYES:   CONJUNCTIVAE/LIDS:   Conjunctivae appear normal.  Sclerae appear normal.  Eyelids normal bilaterally.   PUPILS:  Pupils equal and reactive.   MOUTH/THROAT:  Lips without lesions.  No lesions noted in mouth.  Tongue is without lesions.  Oropharynx without redness or lesions.  Uvula elevates midline.  Teeth are in good repair.   NECK/THYROID:   Supple.  No elevation of the jugular venous pulsation.  Trachea midline.  No neck masses.  No thyroid tenderness.  No thyroid nodule.  No thyroid enlargement.   CHEST/RESPIRATORY:  Breathing is even and unlabored.  Breath sounds are clear to auscultation bilaterally.  Chest is nontender and normal in contour.   CARDIOVASCULAR:   CARDIAC:  Rhythm regular.  No murmur.  No extra heart sounds.   ARTERIAL:  Normal pedal pulses.   EDEMA/VARICOSITIES:  No edema.   GASTROINTESTINAL:   ABDOMEN:  Soft.  Normal bowel sounds.  No masses are noted.  No tenderness is present.  No abdominal bruit.   LIVER/SPLEEN/KIDNEY:  No hepatomegaly or tenderness is noted.  No splenomegaly.   HERNIA:  No abdominal wall hernia.    MUSCULOSKELETAL:    HEAD:  Normal to inspection and palpation.   BACK:  No kyphosis.  No scoliosis.  No spinous process tenderness.   EXTREMITIES:   LEFT UPPER EXTREMITY:  Strength intact.  Range of motion unable to perform due to surgery.   RIGHT UPPER EXTREMITY:  Strength intact.  Range of motion unable to perform due to surgery.  LEFT LOWER EXTREMITY:  Full range of motion.  Normal strength and tone.   RIGHT LOWER EXTREMITY:  Full range of motion.  Normal strength and tone.   PSYCHIATRIC:  The patient is alert and oriented to self and place.  Affect and behavior are appropriate.   SKIN:   INSPECTION:  Chest incisions are clean and dry.    LABORATORY DATA:   White count 11.8, hemoglobin 7.5, platelets 67.    BMP normal.    ASSESSMENT/PLAN:    Severe mitral valve prolapse and severe mitral regurgitation (            ).  Status post repair and MAZE procedure.  Continue rehabilitation and wound care.    Atrial fibrillation 427.31.  Rate controlled.  Lopressor was decreased.    Acute blood loss anemia 285.1.  Continue iron.  Reassess hemoglobin level.  Patient refuses blood products.    Constipation 564.00.  MiraLAX was started.    Hypokalemia 276.8.  Continue K-Dur.  Reassess potassium level.    CHF 428.0.  Well compensated.   Hypertension 401.1.  Well controlled.   V58.69. Check CBC and BMP.    I have reviewed patient's medical records from hospitalization.   CPT CODE:  44010.

## 2012-07-17 ENCOUNTER — Ambulatory Visit
Admission: RE | Admit: 2012-07-17 | Discharge: 2012-07-17 | Disposition: A | Discharge status: Home / Self Care | Payer: Medicare Other | Source: Ambulatory Visit | Attending: Thoracic Surgery (Cardiothoracic Vascular Surgery) | Admitting: Thoracic Surgery (Cardiothoracic Vascular Surgery)

## 2012-07-17 ENCOUNTER — Ambulatory Visit (INDEPENDENT_AMBULATORY_CARE_PROVIDER_SITE_OTHER): Payer: Self-pay | Admitting: Physician Assistant

## 2012-07-17 VITALS — BP 106/56 | HR 75 | Resp 20 | Ht 66.0 in | Wt 126.0 lb

## 2012-07-17 DIAGNOSIS — Z9889 Other specified postprocedural states: Secondary | ICD-10-CM

## 2012-07-17 DIAGNOSIS — I340 Nonrheumatic mitral (valve) insufficiency: Secondary | ICD-10-CM

## 2012-07-17 DIAGNOSIS — I059 Rheumatic mitral valve disease, unspecified: Secondary | ICD-10-CM

## 2012-07-17 DIAGNOSIS — Z8679 Personal history of other diseases of the circulatory system: Secondary | ICD-10-CM

## 2012-07-17 MED ORDER — AMIODARONE HCL 200 MG PO TABS: 200.0000 mg | ORAL_TABLET | Freq: Every day | ORAL | Status: DC

## 2012-07-17 NOTE — Progress Notes (Signed)
[address]            [name] [address]          [phone]     HPI: Patient returns for routine postoperative follow-up having undergone minimally invasive mitral valve repair and Maze procedure by Dr. Cornelius Moras on 06/28/2012 . Postoperatively, she remained in sinus rhythm on Amiodarone and Xarelto.  She had postoperative blood loss anemia, but was treated conservatively with no transfusion in light of her being a Jehovah's Witness.  She was discharged to [facility] for continued rehab on 07/06/2012.  Since hospital discharge, the patient has progressed well.  She is still somewhat weak, but her stamina is improving with physical therapy.  She is eating well.  She denies shortness of breath.  Her incisional discomfort is controlled with Ultram.  She anticipates discharge to home this week, and her family plans to provide 24 hour care.  She has not had a cardiology follow up yet.    Current Outpatient Prescriptions  Medication Sig Dispense Refill  . acetaminophen (TYLENOL) 500 MG tablet Take 500 mg by mouth every 6 (six) hours as needed. For pain      . ALPRAZolam (XANAX) 0.25 MG tablet Take 0.25 mg by mouth 3 (three) times daily as needed for sleep. For anxiety      . amiodarone (PACERONE) 200 MG tablet Take 1 tablet (200 mg total) by mouth daily.  60 tablet  2  . docusate sodium 100 MG CAPS Take 200 mg by mouth 2 (two) times daily as needed for constipation.  10 capsule    . furosemide (LASIX) 20 MG tablet Take 20 mg by mouth daily after breakfast.      . iron polysaccharides (NIFEREX) 150 MG capsule Take 150 mg by mouth daily after lunch.      . metoprolol tartrate (LOPRESSOR) 25 MG tablet Take 0.5 tablets (12.5 mg total) by mouth 2 (two) times daily.      . potassium chloride SA (K-DUR,KLOR-CON) 20 MEQ tablet Take 1 tablet (20 mEq total) by mouth daily.  30 tablet  6  . Probiotic Product (PROBIOTIC DAILY PO) Take 1 capsule by mouth daily.      .  Rivaroxaban (XARELTO) 15 MG TABS tablet Take 1 tablet (15 mg total) by mouth daily with supper.  30 tablet  3  . traMADol (ULTRAM) 50 MG tablet Take 1-2 tablets (50-100 mg total) by mouth every 4 (four) hours as needed.  30 tablet     No current facility-administered medications for this visit.     Physical Exam: BP 106/56 HR 75 Resp 20 Wounds: Clean and dry, no erythema or drainage. Heart: regular rate and rhythm, no murmurs Lungs: Clear to auscultation Extremities: No lower extremity edema   Diagnostic Tests: Chest xray: Dg Chest 2 View  07/17/2012  *RADIOLOGY REPORT*  Clinical Data: Status post mitral valve surgery, shortness of breath and weakness  CHEST - 2 VIEW  Comparison: 07/04/2012  Findings: The cardiac shadow is stable.  Postoperative changes are again noted.  There is improved aeration in the left lung base with resolution of previously seen effusion and infiltrate.  No significant residual infiltrate is noted.  No new infiltrate is seen.  Previously seen pneumothoraces have resolved in the interval.  IMPRESSION: Resolution of previously seen abnormalities.   Original Report Authenticated By: Alcide Clever, M.D.  Assessment/Plan: The patient is progressing well, status post mini-MV repair and Maze.  She remains in sinus rhythm, and we have decreased her Amiodarone dose to 200 mg daily.  She plans to return home this week, and is encouraged to enroll in outpatient cardiac rehab.  She will see Dr. Clifton James in the next 2 weeks. Dr. Cornelius Moras also saw the patient today and advised that she wait another 1-2 weeks before she resumes driving.  She may continue to increase her activity as tolerated.  Dr. Cornelius Moras will see her back in 6 weeks.  She may call in the interim is she has any problems.

## 2012-07-19 ENCOUNTER — Telehealth: Payer: Self-pay | Admitting: Cardiovascular Disease

## 2012-07-19 ENCOUNTER — Encounter: Payer: Self-pay | Admitting: Adult Health

## 2012-07-19 NOTE — Telephone Encounter (Signed)
Pt calls today b/c she was not sure whether she should take her furosemide this am.  She was up 5 separate times from midnight until this morning to urinate.  She denies any burning upon urination and no flank pain. This was her first night at home as she has been in rehab since her MVRepair.  Denies any shortness of breath, DOE, no edema or swelling noted by pt.  She is tired from being up all night. She will hold her lasix this am. She has taken all of her other medications  Medications reviewed and reassurance given. I have mailed her appt card for follow-up with Dr. Clifton James on 08/01/12 Mylo Red RN

## 2012-07-19 NOTE — Telephone Encounter (Signed)
New problem   Pt had open heart surgery and when she got home yesterday from Rehab she has been having a problem with urinating frequently through the night. Pt want to talk to a nurse about this matter

## 2012-07-19 NOTE — Progress Notes (Signed)
This encounter was created in error - please disregard.

## 2012-07-19 NOTE — Telephone Encounter (Signed)
Left pt a message to call back. 

## 2012-07-21 DIAGNOSIS — Z48812 Encounter for surgical aftercare following surgery on the circulatory system: Secondary | ICD-10-CM

## 2012-07-25 ENCOUNTER — Telehealth: Payer: Self-pay | Admitting: Cardiovascular Disease

## 2012-07-25 ENCOUNTER — Telehealth: Payer: Self-pay | Admitting: *Deleted

## 2012-07-25 NOTE — Telephone Encounter (Signed)
Pt had Mitral Valve Repair and Maze procedure  Dr Barry Dienes on 06/28/12. Pt called to find out how to clean her post surgery incision. According to pt, she has a red place under her arm pint and she does not remember the instruction given by the nurse of how to clean the area. Pt was advice to call Dr. Barry Dienes thoracic surgeon's office. Pt verbalized understanding.

## 2012-07-25 NOTE — Telephone Encounter (Signed)
[name] [name] had a MINI-MVR/MAZE on 06/28/12 and has seen Dr. Cornelius Moras for follow up.  She calls today with questions with what she can use on her incisions.  I suggested Vitamin E, cocoa butter or Mederma.  She also discussed how tired she feels after taking Lopressor twice daily and her blood pressure is only 90 systolic consistently.  I suggested she reduce the Lopressor to just once daily at night and discuss this with her cardiologist on April 7.  She agrees with my recommendations.

## 2012-07-25 NOTE — Telephone Encounter (Signed)
Pt's incision is red, and has "gaps" in it, pls advise

## 2012-08-01 ENCOUNTER — Ambulatory Visit (INDEPENDENT_AMBULATORY_CARE_PROVIDER_SITE_OTHER): Payer: Medicare Other | Admitting: Cardiovascular Disease

## 2012-08-01 ENCOUNTER — Encounter: Payer: Self-pay | Admitting: Cardiovascular Disease

## 2012-08-01 VITALS — BP 118/69 | HR 88 | Ht 66.5 in | Wt 123.8 lb

## 2012-08-01 DIAGNOSIS — I340 Nonrheumatic mitral (valve) insufficiency: Secondary | ICD-10-CM

## 2012-08-01 DIAGNOSIS — I059 Rheumatic mitral valve disease, unspecified: Secondary | ICD-10-CM

## 2012-08-01 DIAGNOSIS — I4891 Unspecified atrial fibrillation: Secondary | ICD-10-CM

## 2012-08-01 NOTE — Patient Instructions (Addendum)
Your physician wants you to follow-up in:  3 months.  You will receive a reminder letter in the mail two months in advance. If you don't receive a letter, please call our office to schedule the follow-up appointment.   Your physician has recommended you make the following change in your medication:  Stop potassium and furosemide

## 2012-08-01 NOTE — Progress Notes (Signed)
History of Present Illness: 72 y.o. female with history of chronic systolic CHF, severe MR, aortic stenosis, HTN, HLD, GERD, atrial fibrillation who is here today for follow up. She was admitted to [facility] 1/20-1/29/14 with atrial fibrillation with RVR, acute CHF. Echo 05/16/12: EF 40-45%, diffuse HK, severe prolapse of the mitral valve involving the anterior and posterior leaflets, severe MR, moderate LAE, mild RAE, small pericardial effusion. TEE 05/19/12: EF 45%, moderately calcified aorta, severe prolapse of the mitral valve involving the P2 scallop of the posterior leaflet, severe MR, severe LAE, mild RAE, no PFO. Patient was diuresed with IV Lasix due to systolic CHF. She was seen by [facility]. Patient was not cardioverted due to need for upcoming surgery as well as the size of her left atrium. Right and left heart cath 05/22/12: No CAD, EF 40%, PCWP 15, Fick CO 4.1. She was placed on amiodarone for rate control. She was placed on low-dose Xarelto 15 mg daily for anticoagulation. She has since undergone minimally invasive mitral valve repair and Maze procedure by Dr. Cornelius Moras on 06/28/2012 . Postoperatively, she remained in sinus rhythm on Amiodarone and Xarelto. She had postoperative blood loss anemia, but was treated conservatively with no transfusion in light of her being a Jehovah's Witness. She was discharged to [facility] for continued rehab on 07/06/2012.   She is here today for follow up. She is feeling much better. No chest pain or SOB.   Primary Care Physician: Geoffry Paradise  Last Lipid Profile:Lipid Panel  No results found for this basename: chol,  trig,  hdl,  cholhdl,  vldl,  ldlcalc     Past Medical History  Diagnosis Date  . Hypertension     Then hypotension 04/2012 limiting med adjustment.  . Diverticulitis   . Anxiety   . Mitral valve prolapse     a. H/o MVP, with severe MR 04/2012, awaiting surgery.  . Hyperlipidemia     recently- taken off Crestor- due to pain in her legs,  but this was after cardiac cath. , pt. questioning whether the pain in her legs was related to cath. or crestor  . Goiter   . GERD (gastroesophageal reflux disease)   . Aortic stenosis 05/19/2012    trivial by TEE  . PONV (postoperative nausea and vomiting)   . Atrial fibrillation     a. Dx 04/2012.  . Mitral regurgitation     a. Severe by TEE, pending surgery.  . Systolic CHF     a. EF 40% by studies 04/2012 (LV dysf felt 2/2 MR)  . Refusal of blood transfusions as patient is Jehovah's Witness   . Shortness of breath   . Pneumonia     treated /w antibiotic- [facility]- early Feb. 2014  . Chronic kidney disease     stricture- of urethra - treated /w Dr. Elige Radon  . Headache     h/o migraines   . S/P Maze operation for atrial fibrillation 06/28/2012    Complete bilateral atrial lesion set using cryothermy ablation with oversewing of LA appendage via right minithoracotomy  . S/P mitral valve repair 06/28/2012    Complex valvuloplasty including quadrangular resection of posterior leaflet, sliding leaflet plasty, artificial Gore-tex neocord placement x2 and 32mm Sorin Memo 3D ring annuloplasty via right mini thoracotomy     Past Surgical History  Procedure Laterality Date  . Uterine fibroid surgery  1988  . Ovarian cyst surgery  1988  . Tubal ligation  1974  . Tee without cardioversion  05/19/2012    Procedure: TRANSESOPHAGEAL ECHOCARDIOGRAM (TEE);  Surgeon: Dolores Patty, MD;  Location: [facility] ENDOSCOPY;  Service: Cardiovascular;  Laterality: N/A;  . Mitral valve repair Right 06/28/2012    Procedure: MINIMALLY INVASIVE MITRAL VALVE REPAIR (MVR);  Surgeon: Purcell Nails, MD;  Location: [facility] OR;  Service: Open Heart Surgery;  Laterality: Right;  . Minimally invasive maze procedure N/A 06/28/2012    Procedure: MINIMALLY INVASIVE MAZE PROCEDURE;  Surgeon: Purcell Nails, MD;  Location: [facility] OR;  Service: Open Heart Surgery;  Laterality: N/A;  . Intraoperative transesophageal echocardiogram N/A 06/28/2012     Procedure: INTRAOPERATIVE TRANSESOPHAGEAL ECHOCARDIOGRAM;  Surgeon: Purcell Nails, MD;  Location: [facility] OR;  Service: Open Heart Surgery;  Laterality: N/A;    Current Outpatient Prescriptions  Medication Sig Dispense Refill  . acetaminophen (TYLENOL) 500 MG tablet Take 500 mg by mouth every 6 (six) hours as needed. For pain      . ALPRAZolam (XANAX) 0.25 MG tablet Take 0.25 mg by mouth 3 (three) times daily as needed for sleep. For anxiety      . amiodarone (PACERONE) 200 MG tablet Take 1 tablet (200 mg total) by mouth daily.  60 tablet  2  . docusate sodium 100 MG CAPS Take 200 mg by mouth 2 (two) times daily as needed for constipation.  10 capsule    . furosemide (LASIX) 20 MG tablet Take 20 mg by mouth daily after breakfast.      . iron polysaccharides (NIFEREX) 150 MG capsule Take 150 mg by mouth daily after lunch.      . metoprolol tartrate (LOPRESSOR) 25 MG tablet Take 0.5 tablets (12.5 mg total) by mouth 2 (two) times daily.      . potassium chloride SA (K-DUR,KLOR-CON) 20 MEQ tablet Take 1 tablet (20 mEq total) by mouth daily.  30 tablet  6  . Probiotic Product (PROBIOTIC DAILY PO) Take 1 capsule by mouth daily.      . Rivaroxaban (XARELTO) 15 MG TABS tablet Take 1 tablet (15 mg total) by mouth daily with supper.  30 tablet  3  . traMADol (ULTRAM) 50 MG tablet Take 1-2 tablets (50-100 mg total) by mouth every 4 (four) hours as needed.  30 tablet     No current facility-administered medications for this visit.    Allergies  Allergen Reactions  . Sulfa Drugs Cross Reactors Other (See Comments)    Unknown.  . Aspirin Rash  . Naproxen Sodium Swelling and Rash    History   Social History  . Marital Status: Divorced    Spouse Name: N/A    Number of Children: N/A  . Years of Education: N/A   Occupational History  . Not on file.   Social History Main Topics  . Smoking status: Never Smoker   . Smokeless tobacco: Never Used  . Alcohol Use: No     Comment: 1 drink a month  .  Drug Use: No  . Sexually Active: Not on file   Other Topics Concern  . Not on file   Social History Narrative   Divorced - lives alone - remains functionally independent    Family History  Problem Relation Age of Onset  . Colon cancer Father     Review of Systems:  As stated in the HPI and otherwise negative.   BP 118/69  Pulse 88  Ht 5' 6.5" (1.689 m)  Wt 123 lb 12.8 oz (56.155 kg)  BMI 19.68 kg/m2  Physical Examination: General: Well  developed, well nourished, NAD HEENT: OP clear, mucus membranes moist SKIN: warm, dry. No rashes. Neuro: No focal deficits Musculoskeletal: Muscle strength 5/5 all ext Psychiatric: Mood and affect normal Neck: No JVD, no carotid bruits, no thyromegaly, no lymphadenopathy. Lungs:Clear bilaterally, no wheezes, rhonci, crackles Cardiovascular: Regular rate and rhythm. No murmurs, gallops or rubs. Abdomen:Soft. Bowel sounds present. Non-tender.  Extremities: No lower extremity edema. Pulses are 2 + in the bilateral DP/PT.  Assessment and Plan:   1. Mitral valve regurgitation: s/p repair. Examination normal today. Doing well. Will stop Lasix and KDur.   2. Atrial fibrillation: s/p MAZE. Sinus today. She is on Xarelto for anti-coagulation. NO bleeding issues. Continue amiodarone and metoprolol for now. Will likely d/c amiodarone at next visit.

## 2012-08-04 ENCOUNTER — Ambulatory Visit: Payer: Self-pay | Admitting: Thoracic Surgery (Cardiothoracic Vascular Surgery)

## 2012-08-14 ENCOUNTER — Encounter: Payer: Self-pay | Admitting: *Deleted

## 2012-08-17 ENCOUNTER — Other Ambulatory Visit: Payer: Self-pay | Admitting: *Deleted

## 2012-08-18 ENCOUNTER — Ambulatory Visit (INDEPENDENT_AMBULATORY_CARE_PROVIDER_SITE_OTHER): Payer: Self-pay | Admitting: Thoracic Surgery (Cardiothoracic Vascular Surgery)

## 2012-08-18 ENCOUNTER — Encounter: Payer: Self-pay | Admitting: Thoracic Surgery (Cardiothoracic Vascular Surgery)

## 2012-08-18 VITALS — BP 119/60 | HR 100 | Temp 97.2°F | Resp 20 | Ht 66.5 in | Wt 123.0 lb

## 2012-08-18 DIAGNOSIS — Z8679 Personal history of other diseases of the circulatory system: Secondary | ICD-10-CM

## 2012-08-18 DIAGNOSIS — Z9889 Other specified postprocedural states: Secondary | ICD-10-CM

## 2012-08-18 NOTE — Progress Notes (Signed)
[address]            [name] [address]          [phone]     CARDIOTHORACIC SURGERY OFFICE NOTE  Referring Provider is Verne Carrow, MD PCP is Minda Meo, MD   HPI:  Patient returns for followup status post minimally invasive mitral valve repair and Maze procedure on 06/28/2012. She was last seen here in this office on 07/17/2012 at which time she was progressing well. Since then she has continued to do fairly well, although she reports that she still has some pain and numbness in her right breast that has not completely resolved. This is typically exacerbated by coughing or motion. She reports that over the last week it has seemed to improve. For the most part she uses Tylenol when she has pain, but occasionally she has been using Ultram when the pain gets more severe, particularly at night when she is trying to sleep. It has been improving. She has not had any shortness of breath. She has not had any tachypalpitations or dizzy spells. She states that this week she has begun to feel more poorly. In particular her appetite seems to have gotten worse and she hasn't had a bowel movement in 2 or 3 days. She has been having trouble passing urine. She was seen in the office yesterday by Dr. Jacky [name] and apparently has been started on antibiotics for presumed urinary tract infection.  She reportedly told him that she had developed some shortness of breath, and he did a chest x-ray which by report looked completely normal.   Current Outpatient Prescriptions  Medication Sig Dispense Refill  . acetaminophen (TYLENOL) 500 MG tablet Take 500 mg by mouth every 6 (six) hours as needed. For pain      . ALPRAZolam (XANAX) 0.25 MG tablet Take 0.25 mg by mouth 3 (three) times daily as needed for sleep. For anxiety      . amiodarone (PACERONE) 200 MG tablet Take 1 tablet (200 mg total) by mouth daily.  60 tablet  2  . docusate sodium 100 MG CAPS Take 200  mg by mouth 2 (two) times daily as needed for constipation.  10 capsule    . iron polysaccharides (NIFEREX) 150 MG capsule Take 150 mg by mouth daily after lunch.      . metoprolol tartrate (LOPRESSOR) 25 MG tablet Take 12.5 mg by mouth at bedtime.      . Probiotic Product (PROBIOTIC DAILY PO) Take 1 capsule by mouth daily.      . Rivaroxaban (XARELTO) 15 MG TABS tablet Take 1 tablet (15 mg total) by mouth daily with supper.  30 tablet  3  . traMADol (ULTRAM) 50 MG tablet Take 1-2 tablets (50-100 mg total) by mouth every 4 (four) hours as needed.  30 tablet    . nitrofurantoin, macrocrystal-monohydrate, (MACROBID) 100 MG capsule Take 100 mg by mouth 2 (two) times daily.        No current facility-administered medications for this visit.      Physical Exam:   BP 119/60  Pulse 100  Temp(Src) 97.2 F (36.2 C) (Oral)  Resp 20  Ht 5' 6.5" (1.689 m)  Wt 123 lb (55.792 kg)  BMI 19.56 kg/m2  SpO2 98%  General:  Well-appearing  Chest:   Clear to auscultation with symmetrical breath sounds  CV:   Regular rate  and rhythm without murmur  Incisions:  Healing nicely  Abdomen:  Soft nontender  Extremities:  Warm and well-perfused  Diagnostic Tests:  2 channel telemetry rhythm strip demonstrates normal sinus rhythm   Impression:  The patient seems to be doing very well with respect to her surgery 6 weeks ago. She is maintaining sinus rhythm. The soreness she describes her right chest is to be expected and seemed to be continuing to gradually improve.    Plan:  I've recommended that the patient stop taking amiodarone. We will have her return in 6 weeks for further followup and rhythm check.   [name] Decent. Cornelius Moras, MD 08/18/2012 11:07 AM

## 2012-08-18 NOTE — Patient Instructions (Signed)
Stop taking amiodarone 

## 2012-08-19 ENCOUNTER — Inpatient Hospital Stay (HOSPITAL_COMMUNITY)
Admission: EM | Admit: 2012-08-19 | Discharge: 2012-08-24 | DRG: 644 | Disposition: A | Discharge status: Home / Self Care | Payer: Medicare Other | Attending: Internal Medicine | Admitting: Internal Medicine

## 2012-08-19 ENCOUNTER — Emergency Department (HOSPITAL_COMMUNITY): Payer: Medicare Other

## 2012-08-19 ENCOUNTER — Encounter (HOSPITAL_COMMUNITY): Payer: Self-pay

## 2012-08-19 DIAGNOSIS — E0500 Thyrotoxicosis with diffuse goiter without thyrotoxic crisis or storm: Principal | ICD-10-CM | POA: Diagnosis present

## 2012-08-19 DIAGNOSIS — T462X5A Adverse effect of other antidysrhythmic drugs, initial encounter: Secondary | ICD-10-CM | POA: Diagnosis present

## 2012-08-19 DIAGNOSIS — Z7901 Long term (current) use of anticoagulants: Secondary | ICD-10-CM

## 2012-08-19 DIAGNOSIS — E049 Nontoxic goiter, unspecified: Secondary | ICD-10-CM | POA: Diagnosis present

## 2012-08-19 DIAGNOSIS — N189 Chronic kidney disease, unspecified: Secondary | ICD-10-CM | POA: Diagnosis present

## 2012-08-19 DIAGNOSIS — R109 Unspecified abdominal pain: Secondary | ICD-10-CM | POA: Diagnosis present

## 2012-08-19 DIAGNOSIS — R110 Nausea: Secondary | ICD-10-CM | POA: Diagnosis present

## 2012-08-19 DIAGNOSIS — M546 Pain in thoracic spine: Secondary | ICD-10-CM | POA: Diagnosis present

## 2012-08-19 DIAGNOSIS — F419 Anxiety disorder, unspecified: Secondary | ICD-10-CM | POA: Diagnosis present

## 2012-08-19 DIAGNOSIS — R1110 Vomiting, unspecified: Secondary | ICD-10-CM

## 2012-08-19 DIAGNOSIS — I4891 Unspecified atrial fibrillation: Secondary | ICD-10-CM | POA: Diagnosis present

## 2012-08-19 DIAGNOSIS — Z8679 Personal history of other diseases of the circulatory system: Secondary | ICD-10-CM

## 2012-08-19 DIAGNOSIS — K5900 Constipation, unspecified: Secondary | ICD-10-CM | POA: Diagnosis present

## 2012-08-19 DIAGNOSIS — E0590 Thyrotoxicosis, unspecified without thyrotoxic crisis or storm: Secondary | ICD-10-CM | POA: Diagnosis present

## 2012-08-19 DIAGNOSIS — I129 Hypertensive chronic kidney disease with stage 1 through stage 4 chronic kidney disease, or unspecified chronic kidney disease: Secondary | ICD-10-CM | POA: Diagnosis present

## 2012-08-19 DIAGNOSIS — R509 Fever, unspecified: Secondary | ICD-10-CM | POA: Diagnosis present

## 2012-08-19 DIAGNOSIS — R0902 Hypoxemia: Secondary | ICD-10-CM | POA: Diagnosis present

## 2012-08-19 DIAGNOSIS — J90 Pleural effusion, not elsewhere classified: Secondary | ICD-10-CM | POA: Diagnosis present

## 2012-08-19 DIAGNOSIS — R0600 Dyspnea, unspecified: Secondary | ICD-10-CM | POA: Diagnosis present

## 2012-08-19 DIAGNOSIS — I509 Heart failure, unspecified: Secondary | ICD-10-CM | POA: Diagnosis present

## 2012-08-19 DIAGNOSIS — E039 Hypothyroidism, unspecified: Secondary | ICD-10-CM | POA: Diagnosis present

## 2012-08-19 DIAGNOSIS — Z9889 Other specified postprocedural states: Secondary | ICD-10-CM

## 2012-08-19 DIAGNOSIS — R3129 Other microscopic hematuria: Secondary | ICD-10-CM | POA: Diagnosis present

## 2012-08-19 DIAGNOSIS — I5020 Unspecified systolic (congestive) heart failure: Secondary | ICD-10-CM | POA: Diagnosis present

## 2012-08-19 DIAGNOSIS — F411 Generalized anxiety disorder: Secondary | ICD-10-CM | POA: Diagnosis present

## 2012-08-19 DIAGNOSIS — R7989 Other specified abnormal findings of blood chemistry: Secondary | ICD-10-CM | POA: Diagnosis present

## 2012-08-19 DIAGNOSIS — I428 Other cardiomyopathies: Secondary | ICD-10-CM | POA: Diagnosis present

## 2012-08-19 DIAGNOSIS — I5021 Acute systolic (congestive) heart failure: Secondary | ICD-10-CM

## 2012-08-19 DIAGNOSIS — N390 Urinary tract infection, site not specified: Secondary | ICD-10-CM | POA: Diagnosis present

## 2012-08-19 DIAGNOSIS — I5022 Chronic systolic (congestive) heart failure: Secondary | ICD-10-CM | POA: Diagnosis present

## 2012-08-19 LAB — COMPREHENSIVE METABOLIC PANEL
AST: 62 U/L — ABNORMAL HIGH (ref 0–37)
Albumin: 3 g/dL — ABNORMAL LOW (ref 3.5–5.2)
Chloride: 99 mEq/L (ref 96–112)
Creatinine, Ser: 0.77 mg/dL (ref 0.50–1.10)
Potassium: 3.8 mEq/L (ref 3.5–5.1)
Total Bilirubin: 0.7 mg/dL (ref 0.3–1.2)
Total Protein: 7.2 g/dL (ref 6.0–8.3)

## 2012-08-19 LAB — URINALYSIS, ROUTINE W REFLEX MICROSCOPIC
Glucose, UA: NEGATIVE mg/dL
Leukocytes, UA: NEGATIVE
Protein, ur: 100 mg/dL — AB
pH: 5 (ref 5.0–8.0)

## 2012-08-19 LAB — CBC
MCHC: 32.9 g/dL (ref 30.0–36.0)
MCV: 84.2 fL (ref 78.0–100.0)
Platelets: 257 10*3/uL (ref 150–400)
RDW: 14.2 % (ref 11.5–15.5)
WBC: 10.5 10*3/uL (ref 4.0–10.5)

## 2012-08-19 LAB — URINE MICROSCOPIC-ADD ON

## 2012-08-19 MED ORDER — ACETAMINOPHEN 500 MG PO TABS
1000.0000 mg | ORAL_TABLET | Freq: Once | ORAL | Status: AC
  Administered 2012-08-19: 1000 mg via ORAL
  Filled 2012-08-19: qty 2

## 2012-08-19 MED ORDER — SODIUM CHLORIDE 0.9 % IV BOLUS (SEPSIS)
1000.0000 mL | INTRAVENOUS | Status: DC | PRN
  Administered 2012-08-19: 1000 mL via INTRAVENOUS

## 2012-08-19 MED ORDER — IOHEXOL 300 MG/ML  SOLN
20.0000 mL | INTRAMUSCULAR | Status: AC
  Administered 2012-08-19: 50 mL via ORAL

## 2012-08-19 MED ORDER — LORAZEPAM 1 MG PO TABS
1.0000 mg | ORAL_TABLET | Freq: Once | ORAL | Status: AC
  Administered 2012-08-19: 1 mg via ORAL
  Filled 2012-08-19: qty 1

## 2012-08-19 NOTE — ED Notes (Signed)
Patient now complains of abdominal pain that radiates to her upper back x 2 days. Currently being treated for a UTI. Has nausea. No vomiting.

## 2012-08-19 NOTE — ED Provider Notes (Signed)
History     CSN: [id number]  Arrival date & time 08/19/12  2036   First MD Initiated Contact with Patient 08/19/12 2037      Chief Complaint  Patient presents with  . Back Pain    (Consider location/radiation/quality/duration/timing/severity/associated sxs/prior treatment) HPI Comments: 52 y F with PMH of CKD, HTN, anxiety, afib s/p MAZE, systolic CHF and severe MR s/p valvuloplasty in March here for multiple complaints including epigastric abd pain, 1 episode of NB/NB emesis and loose stools.  +chills today.  She was seen 2 days ago by here PCP and reports being diagnosed with a UTI, and likely prescribed Macrobid (she recognized this medication when it was named as a potential medication).  She denies CP, cough, fevers, dysuira, SOB and other complaints.   Patient is a 72 y.o. female presenting with abdominal pain. The history is provided by the patient.  Abdominal Pain Pain location:  Epigastric Pain quality: aching   Pain radiates to:  Does not radiate Pain severity:  Moderate Onset quality:  Gradual Timing:  Constant Progression:  Worsening Chronicity:  New Context: not alcohol use   Associated symptoms: chills, nausea and vomiting   Associated symptoms: no chest pain, no cough, no dysuria, no fever, no shortness of breath, no vaginal bleeding and no vaginal discharge   Vomiting:    Quality:  Stomach contents   Number of occurrences:  1   Timing:  Constant   Past Medical History  Diagnosis Date  . Hypertension     Then hypotension 04/2012 limiting med adjustment.  . Diverticulitis   . Anxiety   . Mitral valve prolapse     a. H/o MVP, with severe MR 04/2012, awaiting surgery.  . Hyperlipidemia     recently- taken off Crestor- due to pain in her legs, but this was after cardiac cath. , pt. questioning whether the pain in her legs was related to cath. or crestor  . Goiter   . GERD (gastroesophageal reflux disease)   . Aortic stenosis 05/19/2012    trivial by TEE  .  PONV (postoperative nausea and vomiting)   . Atrial fibrillation     a. Dx 04/2012.  . Mitral regurgitation     a. Severe by TEE, pending surgery.  . Systolic CHF     a. EF 40% by studies 04/2012 (LV dysf felt 2/2 MR)  . Refusal of blood transfusions as patient is Jehovah's Witness   . Shortness of breath   . Pneumonia     treated /w antibiotic- [facility]- early Feb. 2014  . Chronic kidney disease     stricture- of urethra - treated /w Dr. Elige Radon  . Headache     h/o migraines   . S/P Maze operation for atrial fibrillation 06/28/2012    Complete bilateral atrial lesion set using cryothermy ablation with oversewing of LA appendage via right minithoracotomy  . S/P mitral valve repair 06/28/2012    Complex valvuloplasty including quadrangular resection of posterior leaflet, sliding leaflet plasty, artificial Gore-tex neocord placement x2 and 32mm Sorin Memo 3D ring annuloplasty via right mini thoracotomy     Past Surgical History  Procedure Laterality Date  . Uterine fibroid surgery  1988  . Ovarian cyst surgery  1988  . Tubal ligation  1974  . Tee without cardioversion  05/19/2012    Procedure: TRANSESOPHAGEAL ECHOCARDIOGRAM (TEE);  Surgeon: Dolores Patty, MD;  Location: [facility] ENDOSCOPY;  Service: Cardiovascular;  Laterality: N/A;  . Mitral valve repair Right 06/28/2012  Procedure: MINIMALLY INVASIVE MITRAL VALVE REPAIR (MVR);  Surgeon: Purcell Nails, MD;  Location: [facility] OR;  Service: Open Heart Surgery;  Laterality: Right;  . Minimally invasive maze procedure N/A 06/28/2012    Procedure: MINIMALLY INVASIVE MAZE PROCEDURE;  Surgeon: Purcell Nails, MD;  Location: [facility] OR;  Service: Open Heart Surgery;  Laterality: N/A;  . Intraoperative transesophageal echocardiogram N/A 06/28/2012    Procedure: INTRAOPERATIVE TRANSESOPHAGEAL ECHOCARDIOGRAM;  Surgeon: Purcell Nails, MD;  Location: [facility] OR;  Service: Open Heart Surgery;  Laterality: N/A;    Family History  Problem Relation Age of Onset  . Colon  cancer Father     History  Substance Use Topics  . Smoking status: Never Smoker   . Smokeless tobacco: Never Used  . Alcohol Use: No     Comment: 1 drink a month    OB History   Grav Para Term Preterm Abortions TAB SAB Ect Mult Living                  Review of Systems  Constitutional: Positive for chills. Negative for fever.  Respiratory: Negative for cough, chest tightness and shortness of breath.   Cardiovascular: Negative for chest pain and palpitations.  Gastrointestinal: Positive for nausea, vomiting and abdominal pain.  Genitourinary: Negative for dysuria, flank pain, vaginal bleeding, vaginal discharge and pelvic pain.  Musculoskeletal: Negative for myalgias and joint swelling.  Skin: Negative for rash.  All other systems reviewed and are negative.    Allergies  Sulfa drugs cross reactors; Aspirin; and Naproxen sodium  Home Medications   Current Outpatient Rx  Name  Route  Sig  Dispense  Refill  . acetaminophen (TYLENOL) 500 MG tablet   Oral   Take 500 mg by mouth every 6 (six) hours as needed. For pain         . ALPRAZolam (XANAX) 0.25 MG tablet   Oral   Take 0.25 mg by mouth 3 (three) times daily as needed for sleep. For anxiety         . amiodarone (PACERONE) 200 MG tablet   Oral   Take 1 tablet (200 mg total) by mouth daily.   60 tablet   2     Please discuss this medicine with your cardiologis ...   . docusate sodium 100 MG CAPS   Oral   Take 200 mg by mouth 2 (two) times daily as needed for constipation.   10 capsule      . iron polysaccharides (NIFEREX) 150 MG capsule   Oral   Take 150 mg by mouth daily after lunch.         . metoprolol tartrate (LOPRESSOR) 25 MG tablet   Oral   Take 12.5 mg by mouth at bedtime.         . nitrofurantoin, macrocrystal-monohydrate, (MACROBID) 100 MG capsule   Oral   Take 100 mg by mouth 2 (two) times daily.          . Probiotic Product (PROBIOTIC DAILY PO)   Oral   Take 1 capsule by mouth  daily.         . Rivaroxaban (XARELTO) 15 MG TABS tablet   Oral   Take 1 tablet (15 mg total) by mouth daily with supper.   30 tablet   3   . traMADol (ULTRAM) 50 MG tablet   Oral   Take 1-2 tablets (50-100 mg total) by mouth every 4 (four) hours as needed.   30 tablet  BP 119/50  Pulse 116  Temp(Src) 120.6 (Oral)  Resp 20  SpO2 96%  Physical Exam  Vitals reviewed. Constitutional: She is oriented to person, place, and time. She appears well-developed and well-nourished.  Appears anxious  HENT:  Right Ear: External ear normal.  Left Ear: External ear normal.  Mouth/Throat: No oropharyngeal exudate.  Eyes: Conjunctivae and EOM are normal. Pupils are equal, round, and reactive to light.  Neck: Normal range of motion. Neck supple.  Cardiovascular: Normal heart sounds and intact distal pulses.  Exam reveals no gallop and no friction rub.   No murmur heard. tachycardic  Pulmonary/Chest: Effort normal.  Diminished in the bases  Abdominal: Soft. Bowel sounds are normal. She exhibits no distension and no mass. There is tenderness (non-localizing). There is guarding (voluntary). There is no rebound and no CVA tenderness.  Musculoskeletal: Normal range of motion. She exhibits no edema.  Neurological: She is alert and oriented to person, place, and time. No cranial nerve deficit.  Skin: Skin is warm and dry. No rash noted.  Psychiatric: She has a normal mood and affect.    ED Course  Procedures (including critical care time)  Labs Reviewed  CBC - Abnormal; Notable for the following:    RBC 3.68 (*)    Hemoglobin 10.2 (*)    HCT 31.0 (*)    All other components within normal limits  COMPREHENSIVE METABOLIC PANEL - Abnormal; Notable for the following:    Sodium 134 (*)    Glucose, Bld 138 (*)    Albumin 3.0 (*)    AST 62 (*)    ALT 48 (*)    Alkaline Phosphatase 268 (*)    GFR calc non Af Amer 83 (*)    All other components within normal limits  URINALYSIS,  ROUTINE W REFLEX MICROSCOPIC - Abnormal; Notable for the following:    Color, Urine AMBER (*)    Hgb urine dipstick LARGE (*)    Bilirubin Urine SMALL (*)    Ketones, ur 40 (*)    Protein, ur 100 (*)    All other components within normal limits  URINE MICROSCOPIC-ADD ON - Abnormal; Notable for the following:    Bacteria, UA FEW (*)    Crystals CA OXALATE CRYSTALS (*)    All other components within normal limits  CULTURE, BLOOD (ROUTINE X 2)  CULTURE, BLOOD (ROUTINE X 2)  URINE CULTURE  TROPONIN I  LIPASE, BLOOD  CG4 I-STAT (LACTIC ACID)   Dg Chest Portable 1 View  08/19/2012  *RADIOLOGY REPORT*  Clinical Data: Sepsis  PORTABLE CHEST - 1 VIEW  Comparison: 07/17/2012  Findings: Cardiac leads obscure detail.  Mild cardiomegaly noted with evidence of mitral valvuloplasty.  Diffusely prominent interstitial markings are noted without focal pulmonary opacity. No pleural effusion.  No acute osseous finding.  IMPRESSION: Cardiomegaly with diffusely prominent interstitial markings which is nonspecific and could indicate early interstitial edema or atypical/viral infectious or inflammatory process.   Original Report Authenticated By: Christiana Pellant, M.D.     Date: 08/20/2012  Rate: 117  Rhythm: sinus tachycardia  QRS Axis: normal  Intervals: PR prolonged  ST/T Wave abnormalities: normal  Conduction Disutrbances:first-degree A-V block   Narrative Interpretation: Poor R wave progression, seen previously on EKG 06/29/12, rate has increased to 117 from 46  Old EKG Reviewed: changes noted    1. Fever   2. Abdominal pain   3. Vomiting       MDM   3 y F with PMH of CKD,  HTN, anxiety, afib s/p MAZE, systolic CHF and severe MR s/p valvuloplasty in March here for multiple complaints including epigastric abd pain, 1 episode of NB/NB emesis and loose stools.  +chills today.  She was seen 2 days ago by here PCP and reports being diagnosed with a UTI, and likely prescribed Macrobid (she recognized  this medication when it was named as a potential medication).  She denies CP, cough, fevers, dysuira, SOB and other complaints.  Febrile, tachycardic, normal BPs.  Lungs diminished in the bases.  Abd soft, but with non-localizing TTP and some voluntary guarding.  No CVAT.  No rigidity.  No edema.  Diff Dx: UTI, Cholecystitis, Appendicitis, PNA, viral syndrome, SBO.  Sepsis labs sent for FUO with her only symptoms being 1 episode of vomiting and abdominal pain.  Will obtain CT abd/pelvis imaging out of concern for abdominal pathology at play, although her pain did not localize on exam.  NS bolus/ 1 g APAP.  12:45 AM RUQ U/S ordered as well given elevation in LFTs and non-localizing abd pain.   2:10 AM Pt care signed out primarily to Dr. Terressa Koyanagi with pt obtaining imaging.  HR improved to 90s.  Please refer to her note for further details of the visit including final clinical impression and disposition.  Pt seen in conjunction with my attending, Dr. Blinda Leatherwood.  Oleh Genin, MD PGY-II [facility] Emergency Medicine Resident       Oleh Genin, MD 08/20/12 [phone]

## 2012-08-19 NOTE — ED Notes (Signed)
Patient presents from home via EMS with c/o upper lumbar back pain. Chronic in nature; has been intermittent for the past 4 days. Patient is more concerned about this because she had heart surgery (mitral valve surgery) 1 month ago. Patient has nausea and headache. No CP. No vomiting. Patient noted to be very anxious. 12 lead EKG WNL. BP 140/64 HR 108

## 2012-08-19 NOTE — ED Notes (Signed)
MD at bedside. 

## 2012-08-20 ENCOUNTER — Encounter (HOSPITAL_COMMUNITY): Payer: Self-pay | Admitting: Internal Medicine

## 2012-08-20 ENCOUNTER — Emergency Department (HOSPITAL_COMMUNITY): Payer: Medicare Other

## 2012-08-20 ENCOUNTER — Inpatient Hospital Stay (HOSPITAL_COMMUNITY): Payer: Medicare Other

## 2012-08-20 DIAGNOSIS — R0600 Dyspnea, unspecified: Secondary | ICD-10-CM | POA: Diagnosis present

## 2012-08-20 DIAGNOSIS — R109 Unspecified abdominal pain: Secondary | ICD-10-CM | POA: Diagnosis present

## 2012-08-20 DIAGNOSIS — R0902 Hypoxemia: Secondary | ICD-10-CM | POA: Diagnosis present

## 2012-08-20 DIAGNOSIS — R3129 Other microscopic hematuria: Secondary | ICD-10-CM | POA: Diagnosis present

## 2012-08-20 DIAGNOSIS — N390 Urinary tract infection, site not specified: Secondary | ICD-10-CM | POA: Diagnosis present

## 2012-08-20 DIAGNOSIS — I5020 Unspecified systolic (congestive) heart failure: Secondary | ICD-10-CM | POA: Diagnosis present

## 2012-08-20 DIAGNOSIS — M546 Pain in thoracic spine: Secondary | ICD-10-CM | POA: Diagnosis present

## 2012-08-20 HISTORY — DX: Other microscopic hematuria: R31.29

## 2012-08-20 LAB — RETICULOCYTES
RBC.: 3.18 MIL/uL — ABNORMAL LOW (ref 3.87–5.11)
Retic Count, Absolute: 50.9 10*3/uL (ref 19.0–186.0)
Retic Ct Pct: 1.6 % (ref 0.4–3.1)

## 2012-08-20 LAB — FERRITIN: Ferritin: 488 ng/mL — ABNORMAL HIGH (ref 10–291)

## 2012-08-20 LAB — PRO B NATRIURETIC PEPTIDE: Pro B Natriuretic peptide (BNP): 2456 pg/mL — ABNORMAL HIGH (ref 0–125)

## 2012-08-20 LAB — TROPONIN I: Troponin I: <0.3 ng/mL (ref <0.30)

## 2012-08-20 LAB — TSH: TSH: 0.01 u[IU]/mL — ABNORMAL LOW (ref 0.350–4.500)

## 2012-08-20 LAB — LIPASE, BLOOD: Lipase: 24 U/L (ref 11–59)

## 2012-08-20 LAB — IRON AND TIBC: UIBC: 208 ug/dL (ref 125–400)

## 2012-08-20 MED ORDER — PIPERACILLIN-TAZOBACTAM 3.375 G IVPB 30 MIN: 3.3750 g | Freq: Four times a day (QID) | INTRAVENOUS | Status: DC

## 2012-08-20 MED ORDER — POLYETHYLENE GLYCOL 3350 17 G PO PACK
17.0000 g | PACK | Freq: Every day | ORAL | Status: DC | PRN
  Filled 2012-08-20: qty 1

## 2012-08-20 MED ORDER — PIPERACILLIN-TAZOBACTAM 3.375 G IVPB 30 MIN
3.3750 g | Freq: Once | INTRAVENOUS | Status: AC
  Administered 2012-08-20: 3.375 g via INTRAVENOUS
  Filled 2012-08-20: qty 50

## 2012-08-20 MED ORDER — FUROSEMIDE 10 MG/ML IJ SOLN
40.0000 mg | Freq: Once | INTRAMUSCULAR | Status: AC
  Administered 2012-08-20: 40 mg via INTRAVENOUS
  Filled 2012-08-20: qty 4

## 2012-08-20 MED ORDER — ALPRAZOLAM 0.25 MG PO TABS
0.2500 mg | ORAL_TABLET | Freq: Three times a day (TID) | ORAL | Status: DC | PRN
  Administered 2012-08-20 – 2012-08-24 (×7): 0.25 mg via ORAL
  Filled 2012-08-20 (×7): qty 1

## 2012-08-20 MED ORDER — ONDANSETRON HCL 4 MG/2ML IJ SOLN: 4.0000 mg | Freq: Four times a day (QID) | INTRAMUSCULAR | Status: DC | PRN

## 2012-08-20 MED ORDER — PIPERACILLIN-TAZOBACTAM 3.375 G IVPB
3.3750 g | Freq: Four times a day (QID) | INTRAVENOUS | Status: DC
  Administered 2012-08-20 – 2012-08-21 (×4): 3.375 g via INTRAVENOUS
  Filled 2012-08-20 (×6): qty 50

## 2012-08-20 MED ORDER — SODIUM CHLORIDE 0.9 % IJ SOLN: 3.0000 mL | Freq: Two times a day (BID) | INTRAMUSCULAR | Status: DC

## 2012-08-20 MED ORDER — ACETAMINOPHEN 650 MG RE SUPP: 650.0000 mg | Freq: Four times a day (QID) | RECTAL | Status: DC | PRN

## 2012-08-20 MED ORDER — MORPHINE SULFATE 2 MG/ML IJ SOLN: 2.0000 mg | INTRAMUSCULAR | Status: DC | PRN

## 2012-08-20 MED ORDER — ONDANSETRON HCL 4 MG/2ML IJ SOLN
4.0000 mg | Freq: Once | INTRAMUSCULAR | Status: AC
  Administered 2012-08-20: 4 mg via INTRAVENOUS
  Filled 2012-08-20: qty 2

## 2012-08-20 MED ORDER — VANCOMYCIN HCL IN DEXTROSE 1-5 GM/200ML-% IV SOLN
1000.0000 mg | Freq: Once | INTRAVENOUS | Status: AC
  Administered 2012-08-20: 1000 mg via INTRAVENOUS
  Filled 2012-08-20: qty 200

## 2012-08-20 MED ORDER — IOHEXOL 300 MG/ML  SOLN
100.0000 mL | Freq: Once | INTRAMUSCULAR | Status: AC | PRN
  Administered 2012-08-20: 100 mL via INTRAVENOUS

## 2012-08-20 MED ORDER — MORPHINE SULFATE 4 MG/ML IJ SOLN
4.0000 mg | Freq: Once | INTRAMUSCULAR | Status: DC
  Filled 2012-08-20: qty 1

## 2012-08-20 MED ORDER — SODIUM CHLORIDE 0.9 % IJ SOLN: 3.0000 mL | INTRAMUSCULAR | Status: DC | PRN

## 2012-08-20 MED ORDER — RIVAROXABAN 20 MG PO TABS
20.0000 mg | ORAL_TABLET | Freq: Every day | ORAL | Status: DC
  Administered 2012-08-20: 20 mg via ORAL
  Filled 2012-08-20 (×2): qty 1

## 2012-08-20 MED ORDER — ONDANSETRON HCL 4 MG PO TABS: 4.0000 mg | ORAL_TABLET | Freq: Four times a day (QID) | ORAL | Status: DC | PRN

## 2012-08-20 MED ORDER — SODIUM CHLORIDE 0.9 % IJ SOLN
3.0000 mL | Freq: Two times a day (BID) | INTRAMUSCULAR | Status: DC
  Administered 2012-08-21 – 2012-08-23 (×6): 3 mL via INTRAVENOUS

## 2012-08-20 MED ORDER — IOHEXOL 350 MG/ML SOLN
80.0000 mL | Freq: Once | INTRAVENOUS | Status: AC | PRN
  Administered 2012-08-20: 80 mL via INTRAVENOUS

## 2012-08-20 MED ORDER — SODIUM CHLORIDE 0.9 % IV BOLUS (SEPSIS): 500.0000 mL | Freq: Once | INTRAVENOUS | Status: DC

## 2012-08-20 MED ORDER — SODIUM CHLORIDE 0.9 % IV BOLUS (SEPSIS)
1000.0000 mL | Freq: Once | INTRAVENOUS | Status: AC
  Administered 2012-08-20: 1000 mL via INTRAVENOUS

## 2012-08-20 MED ORDER — PANTOPRAZOLE SODIUM 40 MG PO TBEC
40.0000 mg | DELAYED_RELEASE_TABLET | Freq: Every day | ORAL | Status: DC
  Administered 2012-08-20 – 2012-08-21 (×2): 40 mg via ORAL
  Filled 2012-08-20 (×2): qty 1

## 2012-08-20 MED ORDER — DOCUSATE SODIUM 100 MG PO CAPS
100.0000 mg | ORAL_CAPSULE | Freq: Two times a day (BID) | ORAL | Status: DC
  Administered 2012-08-20 – 2012-08-24 (×8): 100 mg via ORAL
  Filled 2012-08-20 (×11): qty 1

## 2012-08-20 MED ORDER — RIVAROXABAN 15 MG PO TABS
15.0000 mg | ORAL_TABLET | Freq: Every day | ORAL | Status: DC
  Filled 2012-08-20: qty 1

## 2012-08-20 MED ORDER — ACETAMINOPHEN 325 MG PO TABS
650.0000 mg | ORAL_TABLET | Freq: Four times a day (QID) | ORAL | Status: DC | PRN
  Administered 2012-08-20 – 2012-08-23 (×4): 650 mg via ORAL
  Filled 2012-08-20 (×4): qty 2

## 2012-08-20 MED ORDER — POLYSACCHARIDE IRON COMPLEX 150 MG PO CAPS
150.0000 mg | ORAL_CAPSULE | Freq: Every day | ORAL | Status: DC
  Administered 2012-08-20 – 2012-08-23 (×4): 150 mg via ORAL
  Filled 2012-08-20 (×6): qty 1

## 2012-08-20 MED ORDER — SODIUM CHLORIDE 0.9 % IV SOLN: 250.0000 mL | INTRAVENOUS | Status: DC | PRN

## 2012-08-20 MED ORDER — TRAMADOL HCL 50 MG PO TABS
50.0000 mg | ORAL_TABLET | ORAL | Status: DC | PRN
  Administered 2012-08-20 – 2012-08-22 (×3): 50 mg via ORAL
  Administered 2012-08-23: 100 mg via ORAL
  Filled 2012-08-20 (×4): qty 2

## 2012-08-20 NOTE — H&P (Signed)
PCP:   Minda Meo, MD   Chief Complaint:  "I feel lousy"  HPI: Ms. Richardson Dopp is a 72 year old white female with a history of mitral regurgitation status post recent mitral valvuloplasty (3/14), congestive heart failure (EF 40%), and atrial fibrillation status post Maze procedure on anticoagulation who presented to the emergency department with a multitude of complaints. Her history is very challenging in that she jumps from one issue to another but it seems that her presenting symptom was thoracic back pain and abdominal pain.  She becomes very anxious when describing her symptoms. She was seen by the nurse practitioner at Dr. Lanell Matar office on 4/24 with similar symptoms. A chest x-ray was performed at that time that showed a small right pleural effusion which was improved. Urinalysis was suggestive of urinary tract infection and she was started on Macrobid. The following day she saw Dr. Cornelius Moras we stopped her amiodarone that she been on for awhile. She states that since that time she's continued to feel worse with increased back pain, shortness of breath, epigastric abdominal pain, and chills.  She presented to the emergency department for evaluation of these symptoms and underwent a chest x-ray that showed diffusely prominent interstitial markings (nonspecific).  Abdominal ultrasound showed small right pleural effusion but no gallbladder abnormalities. CT of the abdomen and pelvis with contrast showed no acute findings.  Labs are significant for a moderately elevated BNP 2456.  Cardiac enzymes are negative. Lipase is normal to LFTs are mildly elevated.in the ER she has received a dose of Lasix40 mg IV, vancomycin, and Zosyn.  I was called for admission.   As I was examining the patient she became extremely anxious and tremulous. Her oxygen saturations dropped to 80% on room air with a decrease in her blood pressure with systolic blood pressures in the 30s.    Review of Systems:  Review of Systems -  all systems reviewed with the patient and are negative except in history of present illness with following exceptions: Nausea, dark stools (on iron) withfrequent bowel movements yesterday, hands hurt, neck hurts, chronic right-sided axillary pain since her surgery related to her incision, frequent urination  Past Medical History: Past Medical History  Diagnosis Date  . Hypertension     Then hypotension 04/2012 limiting med adjustment.  . Diverticulitis   . Anxiety   . Mitral valve prolapse     a. H/o MVP, with severe MR 04/2012, awaiting surgery.  . Hyperlipidemia     recently- taken off Crestor- due to pain in her legs, but this was after cardiac cath. , pt. questioning whether the pain in her legs was related to cath. or crestor  . Goiter   . GERD (gastroesophageal reflux disease)   . Aortic stenosis 05/19/2012    trivial by TEE  . PONV (postoperative nausea and vomiting)   . Atrial fibrillation     a. Dx 04/2012.  . Mitral regurgitation     a. Severe by TEE, pending surgery.  . Systolic CHF     a. EF 40% by studies 04/2012 (LV dysf felt 2/2 MR)  . Refusal of blood transfusions as patient is Jehovah's Witness   . Shortness of breath   . Pneumonia     treated /w antibiotic- [facility]- early Feb. 2014  . Chronic kidney disease     stricture- of urethra - treated /w Dr. Elige Radon  . Headache     h/o migraines   . S/P Maze operation for atrial fibrillation 06/28/2012  Complete bilateral atrial lesion set using cryothermy ablation with oversewing of LA appendage via right minithoracotomy  . S/P mitral valve repair 06/28/2012    Complex valvuloplasty including quadrangular resection of posterior leaflet, sliding leaflet plasty, artificial Gore-tex neocord placement x2 and 32mm Sorin Memo 3D ring annuloplasty via right mini thoracotomy    Mitral valve prolapse, Hyperlipidemia, GERD, Diverticulitis, Hemorhoids, Aortic stenosis, goiter, htn,  Afib-1/14  Past Surgical History  Procedure Laterality  Date  . Uterine fibroid surgery  1988  . Ovarian cyst surgery  1988  . Tubal ligation  1974  . Tee without cardioversion  05/19/2012    Procedure: TRANSESOPHAGEAL ECHOCARDIOGRAM (TEE);  Surgeon: Dolores Patty, MD;  Location: [facility] ENDOSCOPY;  Service: Cardiovascular;  Laterality: N/A;  . Mitral valve repair Right 06/28/2012    Procedure: MINIMALLY INVASIVE MITRAL VALVE REPAIR (MVR);  Surgeon: Purcell Nails, MD;  Location: [facility] OR;  Service: Open Heart Surgery;  Laterality: Right;  . Minimally invasive maze procedure N/A 06/28/2012    Procedure: MINIMALLY INVASIVE MAZE PROCEDURE;  Surgeon: Purcell Nails, MD;  Location: [facility] OR;  Service: Open Heart Surgery;  Laterality: N/A;  . Intraoperative transesophageal echocardiogram N/A 06/28/2012    Procedure: INTRAOPERATIVE TRANSESOPHAGEAL ECHOCARDIOGRAM;  Surgeon: Purcell Nails, MD;  Location: [facility] OR;  Service: Open Heart Surgery;  Laterality: N/A;    Goiter biopsy, Bladder surgery echo 2008, 2012 t4 bx benighn 5/11 cath 2014  Medications: Prior to Admission medications   Medication Sig Start Date End Date Taking? Authorizing Provider  acetaminophen (TYLENOL) 500 MG tablet Take 500 mg by mouth every 6 (six) hours as needed. For pain   Yes Historical Provider, MD  ALPRAZolam (XANAX) 0.25 MG tablet Take 0.25 mg by mouth 3 (three) times daily as needed for sleep. For anxiety   Yes Historical Provider, MD  docusate sodium 100 MG CAPS Take 200 mg by mouth 2 (two) times daily as needed for constipation. 07/05/12  Yes Erin Barrett, PA-C  iron polysaccharides (NIFEREX) 150 MG capsule Take 150 mg by mouth daily after lunch. 05/24/12  Yes Dayna N Dunn, PA-C  metoprolol tartrate (LOPRESSOR) 25 MG tablet Take 12.5 mg by mouth at bedtime. 07/05/12  Yes Erin Barrett, PA-C  nitrofurantoin, macrocrystal-monohydrate, (MACROBID) 100 MG capsule Take 100 mg by mouth 2 (two) times daily.  08/17/12  Yes Historical Provider, MD  Probiotic Product (PROBIOTIC DAILY PO) Take 1  capsule by mouth daily.   Yes Historical Provider, MD  Rivaroxaban (XARELTO) 15 MG TABS tablet Take 1 tablet (15 mg total) by mouth daily with supper. 05/24/12  Yes Dayna N Dunn, PA-C  traMADol (ULTRAM) 50 MG tablet Take 1-2 tablets (50-100 mg total) by mouth every 4 (four) hours as needed. 07/05/12  Yes Erin Barrett, PA-C    Allergies:   Allergies  Allergen Reactions  . Sulfa Drugs Cross Reactors Other (See Comments)    Unknown.  . Aspirin Rash  . Naproxen Sodium Swelling and Rash    Social History:  reports that she has never smoked. She has never used smokeless tobacco. She reports that she does not drink alcohol or use illicit drugs.  Ms. Valera is divorced and has 3 children. She works for [employer]. 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 Denies tobacco use.   Family History: Family History  Problem Relation Age of Onset  . Colon cancer Father     Physical Exam: Filed Vitals:   08/20/12 0330 08/20/12 0400 08/20/12 0417 08/20/12  0430  BP: 107/40 111/38 108/40 115/43  Pulse: 87 87 86 87  Temp:   98.7 F (37.1 C)   TempSrc:   Oral   Resp: 21 15 23 21   SpO2: 100% 100% 100% 100%   General appearance: extremely anxious, tremulous, weak voice Head: Normocephalic, without obvious abnormality, atraumatic Eyes: conjunctivae/corneas clear. PERRL, EOM's intact.  Nose: Nares normal. Septum midline. Mucosa normal. No drainage or sinus tenderness. Throat: oropharynx is dry Neck: no adenopathy, no carotid bruit, no JVD and thyroid not enlarged, symmetric, no tenderness/mass/nodules Resp: clear to auscultation bilaterally Cardio: tachycardic, regular without murmur GI: soft, non-tender; bowel sounds normal; no masses,  no organomegaly Extremities: extremities normal, atraumatic, no cyanosis or edema Pulses: 2+ and symmetric Lymph nodes: Cervical adenopathy: no cervical lymphadenopathy Neurologic: Alert and oriented X 3, normal  strength and tone. Normal symmetric reflexes.     Labs on Admission:   Recent Labs  08/19/12 2102  NA 134*  K 3.8  CL 99  CO2 26  GLUCOSE 138*  BUN 13  CREATININE 0.77  CALCIUM 10.0    Recent Labs  08/19/12 2102  AST 62*  ALT 48*  ALKPHOS 268*  BILITOT 0.7  PROT 7.2  ALBUMIN 3.0*    Recent Labs  08/19/12 2112  LIPASE 24    Recent Labs  08/19/12 2102  WBC 10.5  HGB 10.2*  HCT 31.0*  MCV 84.2  PLT 257    Recent Labs  08/19/12 2102  TROPONINI <0.30   Lab Results  Component Value Date   INR 1.02 06/15/2012   INR 1.24 05/15/2012   INR 1.15 05/15/2012   No results found for this basename: TSH, T4TOTAL, FREET3, T3FREE, THYROIDAB,  in the last 72 hours No results found for this basename: VITAMINB12, FOLATE, FERRITIN, TIBC, IRON, RETICCTPCT,  in the last 72 hours  Radiological Exams on Admission: US Abdomen Complete  08/20/2012  *RADIOLOGY REPORT*  Clinical Data:  Reflux, indigestion, and back pain.  COMPLETE ABDOMINAL ULTRASOUND  Comparison:  CT abdomen and pelvis 04/24/2011.  Findings:  Gallbladder:  No gallstones, gallbladder wall thickening, or pericholecystic fluid.  Common bile duct:  Normal caliber with diameter measured at 2.6 mm.  Liver:  No focal lesion identified.  Within normal limits in parenchymal echogenicity.  IVC:  Appears normal.  Pancreas:  No focal abnormality seen.  Spleen:  Spleen length measures 6.3 cm.  Normal parenchymal echotexture.  Right Kidney:  Right kidney measures 10.8 cm length.  No hydronephrosis.  There is a simple appearing cyst measuring up to 7.7 cm diameter.  This was present on the previous CT scan.  Left Kidney:  Left kidney measures left kidney measures 10.6 cm length.  No hydronephrosis.  Abdominal aorta:  No aneurysm identified.  A small right pleural effusion is incidentally demonstrated.  IMPRESSION: Small right pleural effusion. Stable large right renal cyst.  No acute findings demonstrated in the abdomen or pelvis.    Original Report Authenticated By: Burman Nieves, M.D.    Ct Abdomen Pelvis W Contrast  08/20/2012  *RADIOLOGY REPORT*  Clinical Data: Epigastric abdominal pain, nausea, vomiting, loose stools, chills.  CT ABDOMEN AND PELVIS WITH CONTRAST  Technique:  Multidetector CT imaging of the abdomen and pelvis was performed following the standard protocol during bolus administration of intravenous contrast.  Contrast: OMNIPAQUE IOHEXOL 300 MG/ML  SOLN  Comparison: 04/24/2011  Findings: Small bilateral pleural effusions with atelectasis in the lung bases, greater on the right side.  Diffuse cardiac enlargement.  Calcification in the mitral valve annulus.  Liver, spleen, gallbladder, pancreas, adrenal glands, inferior vena cava, retroperitoneal lymph nodes, stomach, small bowel, and colon are unremarkable.  Calcification of the abdominal aorta without aneurysm.  Cyst in the lower pole of the right kidney measuring up to 6.4 cm diameter.  This is stable since previous study.  No free air or free fluid in the abdomen.  Pelvis:  Bladder wall is not thickened.  The uterus is anteverted and appears somewhat prominent for the patient's age but is stable since the previous study.  No abnormal adnexal masses.  The appendix is normal.  Diverticula in the sigmoid colon without diverticulitis.  No free or loculated pelvic fluid collections.  No significant pelvic lymphadenopathy.  Diffuse bone demineralization. Degenerative changes in the lumbar spine.  No lumbar compression deformities.  Degenerative changes in the facet joints.  Pelvis, sacrum, and hips appear intact.  IMPRESSION: Small bilateral pleural effusions with basilar atelectasis or consolidation, greater on the right side.  No significant change in the appearance of the abdomen or pelvis since previous study.   Original Report Authenticated By: Burman Nieves, M.D.    Dg Chest Portable 1 View  08/19/2012  *RADIOLOGY REPORT*  Clinical Data: Sepsis  PORTABLE CHEST  - 1 VIEW  Comparison: 07/17/2012  Findings: Cardiac leads obscure detail.  Mild cardiomegaly noted with evidence of mitral valvuloplasty.  Diffusely prominent interstitial markings are noted without focal pulmonary opacity. No pleural effusion.  No acute osseous finding.  IMPRESSION: Cardiomegaly with diffusely prominent interstitial markings which is nonspecific and could indicate early interstitial edema or atypical/viral infectious or inflammatory process.   Original Report Authenticated By: Christiana Pellant, M.D.    Orders placed during the hospital encounter of 08/19/12  . EKG 12-LEAD- sinus tachycardia with PVCs, lateral ST depression <95mm    Assessment/Plan 1. Thoracic back pain with dyspnea/hypoxia-given her hypoxia we'll obtain a CT of the chest with contrast to rule out PE (she has been taking Xeralto so suspicion is very low but she has missed her last 2 doses).  CT will also verify that there is no aortic dissection and may clarify interstitial markings on CXR.  ?amiodarone lung toxicity?.  Repeat EKG and cardiac enzymes. 2. Epigastric abdominal pain-her abdominal ultrasound and CT are reassuring.  Will place on a PPI to 3. Elevated LFTs-she has new onset elevated liver function test which may be secondary to Macrobid, amiodarone, or related to her underlying issue.  CT and ultrasound are unrevealing.  We'll monitor liver function tests. 4. Systolic CHF-Her BNP is moderately elevated with no significant edema on chest x-ray. She did receive one dose of Lasix in the emergency department. I do not think that we need to continue this at this point.  Will hold her beta blocker due to hypotension. 5. UTI/microscopic hematuria- we'll discontinue her Macrobid and use Zosyn as an alternative while trying to sort out her presentation. Urine and blood cultures are pending.  No fever so I doubt sepsis the we will need to monitor her developing hypotension. 6. Generalized anxiety-anxiety appears to be  compounding her presentation. We will use Xanax as needed for anxiety. 7. MR s/p Mitral Valvuloplasty- She saw Dr. Cornelius Moras this past week with no apparent complications. 8. Afib s/p MAZE procedure on anticoagulation-she is currently in sinus rhythm. Continues Xeralto for anticoagulation.  Her amiodarone has been discontinued. We'll obtain thyroid function test to make sure hyper/hypothyroidism is not contributing to her symptoms. 9. Anemia- obtain anemia panel and  continue iron.  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 10. Disposition-anticipate discharge to home in 2-3 days if no additional findings and her condition stabilizes.  Shajuana Mclucas,W DOUGLAS 08/20/2012, 6:41 AM

## 2012-08-20 NOTE — ED Provider Notes (Signed)
I saw and evaluated the patient, reviewed the resident's note and I agree with the findings and plan.  Patient seen and evaluated for abdominal pain. I did evaluate the patient she did have tenderness in the upper abdomen and LFTs were abnormal. Workup for possible cholecystitis performed.  Gilda Crease, MD 08/20/12 [phone]

## 2012-08-20 NOTE — ED Notes (Signed)
To bedside commode- having shaking chills-- warm blankets given

## 2012-08-20 NOTE — ED Notes (Signed)
Patient transported to CT 

## 2012-08-20 NOTE — ED Notes (Signed)
Patient has returned from CT

## 2012-08-21 ENCOUNTER — Inpatient Hospital Stay (HOSPITAL_COMMUNITY): Payer: Medicare Other

## 2012-08-21 DIAGNOSIS — J90 Pleural effusion, not elsewhere classified: Secondary | ICD-10-CM | POA: Diagnosis present

## 2012-08-21 DIAGNOSIS — E0590 Thyrotoxicosis, unspecified without thyrotoxic crisis or storm: Secondary | ICD-10-CM

## 2012-08-21 DIAGNOSIS — R509 Fever, unspecified: Secondary | ICD-10-CM | POA: Diagnosis present

## 2012-08-21 DIAGNOSIS — E049 Nontoxic goiter, unspecified: Secondary | ICD-10-CM | POA: Diagnosis present

## 2012-08-21 HISTORY — DX: Thyrotoxicosis, unspecified without thyrotoxic crisis or storm: E05.90

## 2012-08-21 LAB — URINE CULTURE
Colony Count: NO GROWTH
Culture: NO GROWTH

## 2012-08-21 LAB — COMPREHENSIVE METABOLIC PANEL
AST: 36 U/L (ref 0–37)
CO2: 29 mEq/L (ref 19–32)
Calcium: 9.6 mg/dL (ref 8.4–10.5)
Creatinine, Ser: 0.86 mg/dL (ref 0.50–1.10)
GFR calc non Af Amer: 67 mL/min — ABNORMAL LOW (ref >90)
Total Protein: 6 g/dL (ref 6.0–8.3)

## 2012-08-21 LAB — CBC
MCH: 27.8 pg (ref 26.0–34.0)
MCHC: 33.7 g/dL (ref 30.0–36.0)
MCV: 82.5 fL (ref 78.0–100.0)
Platelets: 247 10*3/uL (ref 150–400)
RBC: 3.09 MIL/uL — ABNORMAL LOW (ref 3.87–5.11)
RDW: 14.5 % (ref 11.5–15.5)

## 2012-08-21 LAB — T4, FREE: Free T4: 2.04 ng/dL — ABNORMAL HIGH (ref 0.80–1.80)

## 2012-08-21 MED ORDER — PANTOPRAZOLE SODIUM 40 MG PO TBEC
40.0000 mg | DELAYED_RELEASE_TABLET | Freq: Two times a day (BID) | ORAL | Status: DC
  Administered 2012-08-22 – 2012-08-24 (×6): 40 mg via ORAL
  Filled 2012-08-21 (×6): qty 1

## 2012-08-21 MED ORDER — ALUM & MAG HYDROXIDE-SIMETH 200-200-20 MG/5ML PO SUSP: 30.0000 mL | ORAL | Status: DC | PRN

## 2012-08-21 MED ORDER — PIPERACILLIN-TAZOBACTAM 3.375 G IVPB
3.3750 g | Freq: Three times a day (TID) | INTRAVENOUS | Status: DC
  Administered 2012-08-21 – 2012-08-24 (×9): 3.375 g via INTRAVENOUS
  Filled 2012-08-21 (×11): qty 50

## 2012-08-21 MED ORDER — SODIUM CHLORIDE 0.9 % IV SOLN
25.0000 mg | Freq: Once | INTRAVENOUS | Status: AC
  Administered 2012-08-21: 25 mg via INTRAVENOUS
  Filled 2012-08-21: qty 0.5

## 2012-08-21 MED ORDER — METHIMAZOLE 10 MG PO TABS
10.0000 mg | ORAL_TABLET | Freq: Two times a day (BID) | ORAL | Status: DC
  Administered 2012-08-21 – 2012-08-24 (×7): 10 mg via ORAL
  Filled 2012-08-21 (×9): qty 1

## 2012-08-21 MED ORDER — PREDNISONE 20 MG PO TABS
40.0000 mg | ORAL_TABLET | Freq: Every day | ORAL | Status: DC
  Administered 2012-08-21 – 2012-08-24 (×4): 40 mg via ORAL
  Filled 2012-08-21 (×6): qty 2

## 2012-08-21 MED ORDER — SODIUM CHLORIDE 0.9 % IV SOLN
500.0000 mg | INTRAVENOUS | Status: AC
  Administered 2012-08-21 – 2012-08-22 (×2): 500 mg via INTRAVENOUS
  Filled 2012-08-21 (×3): qty 10

## 2012-08-21 NOTE — Progress Notes (Addendum)
Subjective: Feels a little better today- main complaint is left abdominal pain.  Worse after eating.  BM are dark but no blood (on iron).  No shortness of breath.  Endorses feeling jittery, weight loss 15lbs.    Objective: Vital signs in last 24 hours: Temp:  [97.2 F (36.2 C)-98.2 F (36.8 C)] 98 F (36.7 C) (04/28 0520) Pulse Rate:  [78-106] 89 (04/28 0520) Resp:  [16-20] 16 (04/28 0520) BP: (92-115)/(40-57) 104/52 mmHg (04/28 0520) SpO2:  [96 %-100 %] 97 % (04/28 0520) Weight:  [54.704 kg (120 lb 9.6 oz)-55.3 kg (121 lb 14.6 oz)] 55.3 kg (121 lb 14.6 oz) (04/28 0520) Weight change:  Last BM Date: 08/20/12  CBG (last 3)  No results found for this basename: GLUCAP,  in the last 72 hours  Intake/Output from previous day: 04/27 0701 - 04/28 0700 In: 940 [P.O.:840; IV Piggyback:100] Out: 1150 [Urine:1150] Intake/Output this shift:    General appearance: alert and less anxious, but still mildly tremulous Eyes: no scleral icterus Throat: oropharynx moist without erythema; bilateral right>left goiter Resp: decreased breath sounds right lung base Cardio: regular rate and rhythm GI: soft, non-tender; bowel sounds normal; no masses,  no organomegaly Extremities: no clubbing, cyanosis or edema; no peripheral stigmata of endocarditis   Lab Results:  Recent Labs  08/19/12 2102  NA 134*  K 3.8  CL 99  CO2 26  GLUCOSE 138*  BUN 13  CREATININE 0.77  CALCIUM 10.0    Recent Labs  08/19/12 2102  AST 62*  ALT 48*  ALKPHOS 268*  BILITOT 0.7  PROT 7.2  ALBUMIN 3.0*    Recent Labs  08/19/12 2102 08/21/12 0635  WBC 10.5 5.8  HGB 10.2* 8.6*  HCT 31.0* 25.5*  MCV 84.2 82.5  PLT 257 247   Lab Results  Component Value Date   INR 1.02 06/15/2012   INR 1.24 05/15/2012   INR 1.15 05/15/2012    Recent Labs  08/19/12 2102 08/20/12 0810 08/20/12 1527 08/20/12 2015  CKTOTAL  --  13  --   --   CKMB  --  1.3  --   --   TROPONINI <0.30 <0.30 <0.30 <0.30    Recent  Labs  08/20/12 0810  TSH 0.010*    Recent Labs  08/20/12 0810  VITAMINB12 557  FOLATE 15.9  FERRITIN 488*  TIBC 219*  IRON 11*  RETICCTPCT 1.6    Studies/Results: Ct Angio Chest Pe W/cm &/or Wo Cm  08/20/2012  *RADIOLOGY REPORT*  Clinical Data: Back pain.  Hypoxia.  Recent mitral valve valvuloplasty.  CT ANGIOGRAPHY CHEST  Technique:  Multidetector CT imaging of the chest using the standard protocol during bolus administration of intravenous contrast. Multiplanar reconstructed images including MIPs were obtained and reviewed to evaluate the vascular anatomy.  Contrast: 80mL OMNIPAQUE IOHEXOL 350 MG/ML SOLN  Comparison: Chest CT 07/07/2010.  Findings:  Mediastinum: Study is slightly limited by patient respiratory motion.  With these limitations in mind, there is no evidence to suggest clinically relevant central, lobar or segmental sized pulmonary embolism.  Smaller subsegmental sized emboli cannot be entirely excluded. Heart size is mildly enlarged. There is no significant pericardial fluid, thickening or pericardial calcification.  Status post mitral annuloplasty.  The entire left atrial appendage fails to opacified with IV contrast, compatible with complete thrombosis (postsurgical, as there was recent left atrial appendage ligation).  There are numerous borderline enlarged and mildly enlarged mediastinal and bilateral hilar lymph nodes, presumably reactive.  The largest of these measures  up to 1.2 cm in the low right paratracheal station.  There is atherosclerosis of the thoracic aorta, the great vessels of the mediastinum and the coronary arteries, including calcified atherosclerotic plaque in the left main, left anterior descending and left circumflex coronary arteries. Esophagus is unremarkable in appearance. Massive enlargement of the right lobe of the thyroid gland which is heterogeneous in appearance measuring up to approximately 5.6 x 4.6 cm, slightly increased compared to the prior  study from 2012.  Lungs/Pleura: Moderate to large right-sided and small left-sided pleural effusions are simple in appearance layering dependently. These are associated with some passive atelectasis in the lower lobes of the lungs bilaterally.  Within the lungs there is a background of patchy mild ground-glass attenuation associated with intralobular septal thickening and thickening of the fissures, compatible with mild interstitial pulmonary edema.  No definite acute consolidative airspace disease.  Accurate assessment of the lung parenchyma for small pulmonary nodules is limited by considerable respiratory motion, but no large suspicious appearing pulmonary nodules or masses are noted on today's examination.  Upper Abdomen: Small gastric diverticulum incidentally noted.  Musculoskeletal: There are no aggressive appearing lytic or blastic lesions noted in the visualized portions of the skeleton.  IMPRESSION: 1.  Despite the limitations of today's examination there is no evidence to suggest clinically relevant central, lobar or segmental sized pulmonary embolism. 2.  The left atrial appendage fails to fill with contrast, compatible with ligation of the left atrial appendage during recent surgery on 06/28/2012. 3.  Mild interstitial pulmonary edema with moderate to large right pleural effusion and small left pleural effusion, with associated passive atelectasis in the lower lobes of the lungs bilaterally. 4.  Multiple borderline enlarged and mildly enlarged mediastinal and bilateral hilar lymph nodes are likely reactive in the setting of recent surgery. 5.  Persistent mass-like enlargement of the right lobe of the thyroid gland, as above.  This favored to represent an asymmetric goiter, however, correlation with a non emergent thyroid ultrasound is suggested. 6.  Status post minimal invasive mitral valve surgery and annuloplasty.   Original Report Authenticated By: Trudie Reed, M.D.    US Abdomen  Complete  08/20/2012  *RADIOLOGY REPORT*  Clinical Data:  Reflux, indigestion, and back pain.  COMPLETE ABDOMINAL ULTRASOUND  Comparison:  CT abdomen and pelvis 04/24/2011.  Findings:  Gallbladder:  No gallstones, gallbladder wall thickening, or pericholecystic fluid.  Common bile duct:  Normal caliber with diameter measured at 2.6 mm.  Liver:  No focal lesion identified.  Within normal limits in parenchymal echogenicity.  IVC:  Appears normal.  Pancreas:  No focal abnormality seen.  Spleen:  Spleen length measures 6.3 cm.  Normal parenchymal echotexture.  Right Kidney:  Right kidney measures 10.8 cm length.  No hydronephrosis.  There is a simple appearing cyst measuring up to 7.7 cm diameter.  This was present on the previous CT scan.  Left Kidney:  Left kidney measures left kidney measures 10.6 cm length.  No hydronephrosis.  Abdominal aorta:  No aneurysm identified.  A small right pleural effusion is incidentally demonstrated.  IMPRESSION: Small right pleural effusion. Stable large right renal cyst.  No acute findings demonstrated in the abdomen or pelvis.   Original Report Authenticated By: Burman Nieves, M.D.    Ct Abdomen Pelvis W Contrast  08/20/2012  *RADIOLOGY REPORT*  Clinical Data: Epigastric abdominal pain, nausea, vomiting, loose stools, chills.  CT ABDOMEN AND PELVIS WITH CONTRAST  Technique:  Multidetector CT imaging of the abdomen and pelvis was  performed following the standard protocol during bolus administration of intravenous contrast.  Contrast: OMNIPAQUE IOHEXOL 300 MG/ML  SOLN  Comparison: 04/24/2011  Findings: Small bilateral pleural effusions with atelectasis in the lung bases, greater on the right side.  Diffuse cardiac enlargement.  Calcification in the mitral valve annulus.  Liver, spleen, gallbladder, pancreas, adrenal glands, inferior vena cava, retroperitoneal lymph nodes, stomach, small bowel, and colon are unremarkable.  Calcification of the abdominal aorta without  aneurysm.  Cyst in the lower pole of the right kidney measuring up to 6.4 cm diameter.  This is stable since previous study.  No free air or free fluid in the abdomen.  Pelvis:  Bladder wall is not thickened.  The uterus is anteverted and appears somewhat prominent for the patient's age but is stable since the previous study.  No abnormal adnexal masses.  The appendix is normal.  Diverticula in the sigmoid colon without diverticulitis.  No free or loculated pelvic fluid collections.  No significant pelvic lymphadenopathy.  Diffuse bone demineralization. Degenerative changes in the lumbar spine.  No lumbar compression deformities.  Degenerative changes in the facet joints.  Pelvis, sacrum, and hips appear intact.  IMPRESSION: Small bilateral pleural effusions with basilar atelectasis or consolidation, greater on the right side.  No significant change in the appearance of the abdomen or pelvis since previous study.   Original Report Authenticated By: Burman Nieves, M.D.    Dg Chest Portable 1 View  08/19/2012  *RADIOLOGY REPORT*  Clinical Data: Sepsis  PORTABLE CHEST - 1 VIEW  Comparison: 07/17/2012  Findings: Cardiac leads obscure detail.  Mild cardiomegaly noted with evidence of mitral valvuloplasty.  Diffusely prominent interstitial markings are noted without focal pulmonary opacity. No pleural effusion.  No acute osseous finding.  IMPRESSION: Cardiomegaly with diffusely prominent interstitial markings which is nonspecific and could indicate early interstitial edema or atypical/viral infectious or inflammatory process.   Original Report Authenticated By: Christiana Pellant, M.D.      Medications: Scheduled: . docusate sodium  100 mg Oral BID  . iron polysaccharides  150 mg Oral QPC lunch  . pantoprazole  40 mg Oral Q0600  . piperacillin-tazobactam (ZOSYN)  IV  3.375 g Intravenous Q6H  . Rivaroxaban  20 mg Oral Q supper  . sodium chloride  3 mL Intravenous Q12H   Continuous:   Assessment/Plan: 1.  Fever- fever of 102.6 on admission has resolved.  May be secondary to UTI though differential includes infected pleural effusion, SBE, thyrotoxicosis.  No findings (murmur or peripheral stigmata) to suggest endocarditis though she did have recent mitral valvuloplasty.  Will await blood cultures and consider Echo/TEE if positive.  Consider thoracentesis.  Thyrotoxicosis may also be contributing to fever.  Continue empiric Zosyn pending culture results. 2. Right pleural effusion- more apparent on CT than CXR.  Obtain lateral decubitus X-ray to evaluate further and consider thoracentesis pending results.  She is not having much SOB or chest pain today and no further hypoxia.   3. 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  Obtain thyroid uptake scan, Free T4 and Anti-TPO antibodies.  Consider Methimazole and Beta Blocker pending results- this may be contributing to her anxiety, tremor and general sense of feeling poorly (myalgias, etc.).   4. Nausea/Epigastric abdominal pain- heme check stools and consider GI evaluation for EGD if positive given Xeralto use, anemia.  Continue PPI BID.  Add Maalox. 5. Elevated LFTs-she has new onset elevated liver function test which may be secondary to Macrobid, amiodarone, thyrotoxicosis or  related to her underlying issue. CT and ultrasound are unrevealing. Slight improvement today. 4. Systolic CHF-improved BNP after Lasix X 1. Will hold on additional Lasix due to marginal BP. Resume BBlocker when BP stable. 5. UTI/microscopic hematuria- continue Zosyn. Urine and blood cultures are pending.  6. Generalized anxiety-anxiety appears to be compounding her presentation. We will use Xanax as needed for anxiety.  7. MR s/p Mitral Valvuloplasty- She saw Dr. Cornelius Moras this past week with no apparent complications. Consider TEE if fever recurrent or if BP positive. 8. Afib s/p MAZE procedure on anticoagulation-she is currently in sinus rhythm. Continues Xeralto for  anticoagulation. Her amiodarone has been discontinued.  9. Anemia- obtain anemia panel and continue iron. 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  If heme positive will hold Xeralto.    10. Disposition-anticipate discharge to home in 2-3 days if her condition stabilizes.     LOS: 2 days   SHAW,W DOUGLAS 08/21/2012, 7:46 AM   She is unable to have a thyroid uptake scan due to her contrasted CT yesterday.  She would need to be 6 weeks out from contrast exposure for this to be helpful.  Furthermore, amiodarone may obscure the results anyway.  Will discontinue order for thyroid uptake scan and start Tapazole and prednisone to treat amiodarone induced thyrotoxicosis.  We'll need to monitor closely for increasing GI upset with steroid treatment.  Chest x-ray shows a layering pleural effusion.  We'll hold Xarelto tonight in order to consider thoracentesis tomorrow.  Will proceed with IV iron due to anemia- she declines transfusion given beliefs as a Jehovah's witness.

## 2012-08-21 NOTE — Progress Notes (Signed)
Utilization Review Completed Shamila Lerch J. Cruz Bong, RN, BSN, NCM [phone]  

## 2012-08-21 NOTE — Plan of Care (Signed)
Problem: Phase I Progression Outcomes Goal: Other Phase I Outcomes/Goals Outcome: Completed/Met Date Met:  08/21/12 Handout on Hyperthyroidism

## 2012-08-21 NOTE — Progress Notes (Signed)
MEDICATION RELATED CONSULT NOTE - INITIAL   Pharmacy Consult for IV iron Indication: low iron stores, Jehovah's witness  Allergies  Allergen Reactions  . Sulfa Drugs Cross Reactors Other (See Comments)    Unknown.  . Aspirin Rash  . Naproxen Sodium Swelling and Rash    Patient Measurements: Height: 5\' 4"  (162.6 cm) Weight: 121 lb 14.6 oz (55.3 kg) (scale c) IBW/kg (Calculated) : 54.7  Vital Signs: Temp: 98 F (36.7 C) (04/28 0520) Temp src: Oral (04/28 0520) BP: 104/52 mmHg (04/28 0520) Pulse Rate: 89 (04/28 0520) Intake/Output from previous day: 04/27 0701 - 04/28 0700 In: 940 [P.O.:840; IV Piggyback:100] Out: 1150 [Urine:1150]  Labs:  Recent Labs  08/19/12 2102 08/21/12 0635  WBC 10.5 5.8  HGB 10.2* 8.6*  HCT 31.0* 25.5*  PLT 257 247  CREATININE 0.77 0.86  ALBUMIN 3.0* 2.3*  PROT 7.2 6.0  AST 62* 36  ALT 48* 50*  ALKPHOS 268* 211*  BILITOT 0.7 0.3   Estimated Creatinine Clearance: 52.6 ml/min (by C-G formula based on Cr of 0.86).  Iron studies 4/27:  Fe 11, TIBC 219, T-sat 5%, retic count 1.6  Microbiology: Recent Results (from the past 720 hour(s))  CULTURE, BLOOD (ROUTINE X 2)     Status: None   Collection Time    08/19/12  9:10 PM      Result Value Range Status   Specimen Description BLOOD RIGHT ARM   Final   Special Requests BOTTLES DRAWN AEROBIC ONLY 5CC   Final   Culture  Setup Time 08/20/2012 02:14   Final   Culture     Final   Value:        BLOOD CULTURE RECEIVED NO GROWTH TO DATE CULTURE WILL BE HELD FOR 5 DAYS BEFORE ISSUING A FINAL NEGATIVE REPORT   Report Status PENDING   Incomplete  CULTURE, BLOOD (ROUTINE X 2)     Status: None   Collection Time    08/19/12  9:18 PM      Result Value Range Status   Specimen Description BLOOD LEFT HAND   Final   Special Requests BOTTLES DRAWN AEROBIC ONLY 1CC   Final   Culture  Setup Time 08/20/2012 02:14   Final   Culture     Final   Value:        BLOOD CULTURE RECEIVED NO GROWTH TO DATE CULTURE  WILL BE HELD FOR 5 DAYS BEFORE ISSUING A FINAL NEGATIVE REPORT   Report Status PENDING   Incomplete    Medical History: Past Medical History  Diagnosis Date  . Hypertension     Then hypotension 04/2012 limiting med adjustment.  . Diverticulitis   . Anxiety   . Mitral valve prolapse     a. H/o MVP, with severe MR 04/2012, awaiting surgery.  . Hyperlipidemia     recently- taken off Crestor- due to pain in her legs, but this was after cardiac cath. , pt. questioning whether the pain in her legs was related to cath. or crestor  . Goiter   . GERD (gastroesophageal reflux disease)   . Aortic stenosis 05/19/2012    trivial by TEE  . PONV (postoperative nausea and vomiting)   . Atrial fibrillation     a. Dx 04/2012.  . Mitral regurgitation     a. Severe by TEE, pending surgery.  . Systolic CHF     a. EF 40% by studies 04/2012 (LV dysf felt 2/2 MR)  . Refusal of blood transfusions as patient is [employer]  Witness   . Shortness of breath   . Pneumonia     treated /w antibiotic- [facility]- early Feb. 2014  . Chronic kidney disease     stricture- of urethra - treated /w Dr. Elige Radon  . Headache     h/o migraines   . S/P Maze operation for atrial fibrillation 06/28/2012    Complete bilateral atrial lesion set using cryothermy ablation with oversewing of LA appendage via right minithoracotomy  . S/P mitral valve repair 06/28/2012    Complex valvuloplasty including quadrangular resection of posterior leaflet, sliding leaflet plasty, artificial Gore-tex neocord placement x2 and 32mm Sorin Memo 3D ring annuloplasty via right mini thoracotomy    Assessment:  Hgb 10.2->8.6.  Jehovah's witness.  Has been taking Niferex 150 mg daily for about 1 month.  Dark stool noted, no blood.  Also on Docusate daily.  Iron deficit  ~1100 mg.  Goal of Therapy:   Replenishment of iron stores  Hgb 12-14  Plan:   Infed 25 mg IV test dose.  If test dose tolerated, Infed 500 mg IV daily x 2 days.    Zosyn 3.375 grams IV  q6hrs (each over 4 hours) adjusted to q8hr per [facility] standard.    Dennie Fetters, [place] Pager: [phone] 08/21/2012,9:52 AM

## 2012-08-21 NOTE — Evaluation (Signed)
Physical Therapy Evaluation Patient Details Name: [name] [name] MRN: [id number] DOB: 29-Dec-1940 Today's Date: 08/21/2012 Time: 0347-4259 PT Time Calculation (min): 23 min  PT Assessment / Plan / Recommendation Clinical Impression  Pt 72 yo female admitted for thoracic/abdominal pain. Pt lives alone with intermitent assist from family. pt functioning near baseline however requires some assist for I ADLs. Pt to benefit from HHPT to achieve safe mod I function for safe mobility in home. Acute PT to con't to follow to maximize safety with function.    PT Assessment  Patient needs continued PT services    Follow Up Recommendations  Home health PT;Supervision - Intermittent    Does the patient have the potential to tolerate intense rehabilitation      Barriers to Discharge Decreased caregiver support (home alone)      Equipment Recommendations  None recommended by PT    Recommendations for Other Services     Frequency Min 2X/week    Precautions / Restrictions Precautions Precautions: None Restrictions Weight Bearing Restrictions: No   Pertinent Vitals/Pain Pt with + belching during ambulation. Pt reports significant decrease in abdominal pain.      Mobility  Bed Mobility Bed Mobility: Supine to Sit Supine to Sit: 5: Supervision;HOB flat Transfers Transfers: Sit to Stand;Stand to Sit Sit to Stand: 5: Supervision;With upper extremity assist;From bed Stand to Sit: 5: Supervision;With upper extremity assist;To chair/3-in-1 Details for Transfer Assistance: pt with safe technique Ambulation/Gait Ambulation/Gait Assistance: 5: Supervision Ambulation Distance (Feet): 150 Feet Assistive device: None Ambulation/Gait Assistance Details: no episode of LOB Gait Pattern: Step-through pattern;Decreased stride length;Narrow base of support Gait velocity: wfl Stairs: Yes Stairs Assistance: 4: Min guard Stair Management Technique: One rail Right;Alternating pattern;Forwards Number  of Stairs: 5 (limited by IV pole)    Exercises     PT Diagnosis: Generalized weakness  PT Problem List: Decreased strength;Decreased activity tolerance;Decreased balance;Decreased mobility PT Treatment Interventions: Gait training;Stair training;Functional mobility training;Therapeutic exercise;Therapeutic activities   PT Goals Acute Rehab PT Goals PT Goal Formulation: With patient Time For Goal Achievement: 08/28/12 Potential to Achieve Goals: Good Pt will go Supine/Side to Sit: with modified independence PT Goal: Supine/Side to Sit - Progress: Goal set today Pt will go Sit to Stand: with modified independence;with upper extremity assist PT Goal: Sit to Stand - Progress: Goal set today Pt will Ambulate: >150 feet;with modified independence;with least restrictive assistive device PT Goal: Ambulate - Progress: Goal set today Pt will Go Up / Down Stairs: Flight;with modified independence;with rail(s) PT Goal: Up/Down Stairs - Progress: Goal set today  Visit Information  Last PT Received On: 08/21/12 Assistance Needed: +1 PT/OT Co-Evaluation/Treatment: Yes    Subjective Data  Subjective: Pt received supine in bed with c/o of abdominal pain but agreeable to amb. Patient Stated Goal: home   Prior Functioning  Home Living Lives With: Alone Available Help at Discharge: Family;Available PRN/intermittently Type of Home:  (condo) Home Access: Stairs to enter [employer] of Steps: 14 Entrance Stairs-Rails: Can reach both Home Layout: One level Bathroom Shower/Tub: Engineer, manufacturing systems: Standard Bathroom Accessibility: Yes How Accessible: Accessible via walker Home Adaptive Equipment: None Prior Function Level of Independence: Independent Able to Take Stairs?: Yes Driving: Yes Vocation: Retired Musician: No difficulties Dominant Hand: Right    Cognition  Cognition Arousal/Alertness: Awake/alert Behavior During Therapy: WFL for  tasks assessed/performed Overall Cognitive Status: Within Functional Limits for tasks assessed (pt emotional re: family not wanting to help her)    Extremity/Trunk Assessment Right Upper Extremity  Assessment RUE ROM/Strength/Tone: [facility] for tasks assessed Left Upper Extremity Assessment LUE ROM/Strength/Tone: WFL for tasks assessed Right Lower Extremity Assessment RLE ROM/Strength/Tone: [facility] for tasks assessed Left Lower Extremity Assessment LLE ROM/Strength/Tone: WFL for tasks assessed Trunk Assessment Trunk Assessment: Normal   Balance Balance Balance Assessed: Yes Static Sitting Balance Static Sitting - Balance Support: No upper extremity supported Static Sitting - Level of Assistance: 6: Modified independent (Device/Increase time) Static Sitting - Comment/# of Minutes: 5 min  End of Session PT - End of Session Equipment Utilized During Treatment: Gait belt Activity Tolerance: Patient tolerated treatment well Patient left:  (on BSC with call light) Nurse Communication: Mobility status  GP     Marcene Brawn 08/21/2012, 10:37 AM  Lewis Shock, PT, DPT Pager #: [phone] Office #: [phone]

## 2012-08-21 NOTE — Progress Notes (Signed)
Chart review complete.  Patient is not eligible for [facility] Care Management services because [facility] is not in network for with her primary insurance.  For any additional questions or new referrals please contact Anibal Henderson BSN RN [facility] Liaison at [phone]

## 2012-08-21 NOTE — Progress Notes (Signed)
Order received from DR Clelia Croft to d/c thyroid scan. Other orders to follow.

## 2012-08-21 NOTE — Progress Notes (Signed)
Call to DR Clelia Croft to notify ( per Radiology ) that NM thyroid uptake scan cannot be done until 6 weeks after the CT she had with contrast over the weekend.

## 2012-08-21 NOTE — Evaluation (Signed)
Occupational Therapy Evaluation Patient Details Name: [name] [name] MRN: [id number] DOB: 1941/02/12 Today's Date: 08/21/2012 Time: 2130-8657 OT Time Calculation (min): 22 min  OT Assessment / Plan / Recommendation Clinical Impression  This 72 yo female admitted with thoraic and abdominal pain presents to acute OT at a Supervision level. Will benefit from acute OT with follow up HHOT.    OT Assessment  Patient needs continued OT Services    Follow Up Recommendations  Home health OT    Barriers to Discharge Decreased caregiver support    Equipment Recommendations  None recommended by OT       Frequency  Min 2X/week    Precautions / Restrictions Precautions Precautions: None Restrictions Weight Bearing Restrictions: No   Pertinent Vitals/Pain Abdominal pain that subsided after she walked--pt with burping x 2    ADL  Eating/Feeding: Performed;Independent Where Assessed - Eating/Feeding: Edge of bed Grooming: Simulated;Supervision/safety Where Assessed - Grooming: Unsupported standing Upper Body Bathing: Simulated;Supervision/safety Where Assessed - Upper Body Bathing: Unsupported sitting Lower Body Bathing: Simulated;Supervision/safety Where Assessed - Lower Body Bathing: Unsupported sit to stand Upper Body Dressing: Simulated;Supervision/safety Where Assessed - Upper Body Dressing: Supported sitting Lower Body Dressing: Simulated;Supervision/safety Where Assessed - Lower Body Dressing: Unsupported sit to stand Toilet Transfer: Performed;Supervision/safety Toilet Transfer Method: Sit to Barista: Bedside commode Toileting - Architect and Hygiene: Simulated;Supervision/safety Where Assessed - Engineer, mining and Hygiene: Standing Equipment Used:  (None) Transfers/Ambulation Related to ADLs: Supervision for all.    OT Diagnosis: Generalized weakness  OT Problem List: Decreased strength OT Treatment Interventions:  Self-care/ADL training;DME and/or AE instruction;Patient/family education   OT Goals Acute Rehab OT Goals OT Goal Formulation: With patient Time For Goal Achievement: 08/28/12 Potential to Achieve Goals: Good ADL Goals Pt Will Perform Grooming: Independently;Unsupported;Standing at sink ADL Goal: Grooming - Progress: Goal set today Pt Will Perform Upper Body Bathing: Independently;Unsupported;Sitting at sink;Standing at sink ADL Goal: Upper Body Bathing - Progress: Goal set today Pt Will Perform Lower Body Bathing: Independently;Unsupported;Sitting at sink;Standing at sink ADL Goal: Lower Body Bathing - Progress: Goal set today Pt Will Perform Upper Body Dressing: Independently;Sitting, chair;Sitting, bed;Unsupported ADL Goal: Upper Body Dressing - Progress: Goal set today Pt Will Transfer to Toilet: Independently;Ambulation;Regular height toilet;Grab bars ADL Goal: Toilet Transfer - Progress: Goal set today Pt Will Perform Toileting - Clothing Manipulation: Independently;Standing ADL Goal: Toileting - Clothing Manipulation - Progress: Goal set today Pt Will Perform Toileting - Hygiene: Independently;Sit to stand from 3-in-1/toilet ADL Goal: Toileting - Hygiene - Progress: Goal set today  Visit Information  Last OT Received On: 08/21/12 Assistance Needed: +1 PT/OT Co-Evaluation/Treatment: Yes    Subjective Data  Subjective: My daughter has made it clear to me that she is getting tired of helping me   Prior Functioning     Home Living Lives With: Alone Available Help at Discharge: Family;Available PRN/intermittently Type of Home:  (condo) Home Access: Stairs to enter [employer] of Steps: 14 Entrance Stairs-Rails: Can reach both Home Layout: One level Bathroom Shower/Tub: Engineer, manufacturing systems: Standard Bathroom Accessibility: Yes How Accessible: Accessible via walker Home Adaptive Equipment: None Prior Function Level of Independence:  Independent Able to Take Stairs?: Yes Driving: Yes Vocation: Retired Musician: No difficulties Dominant Hand: Right         Vision/Perception Vision - History Baseline Vision: No visual deficits   Cognition  Cognition Arousal/Alertness: Awake/alert Behavior During Therapy: WFL for tasks assessed/performed Overall Cognitive Status: Within Functional Limits for tasks assessed (  pt emotional re: family not wanting to help her)    Extremity/Trunk Assessment Right Upper Extremity Assessment RUE ROM/Strength/Tone: [facility] for tasks assessed Left Upper Extremity Assessment LUE ROM/Strength/Tone: WFL for tasks assessed Right Lower Extremity Assessment RLE ROM/Strength/Tone: [facility] for tasks assessed Left Lower Extremity Assessment LLE ROM/Strength/Tone: WFL for tasks assessed Trunk Assessment Trunk Assessment: Normal     Mobility Bed Mobility Bed Mobility: Supine to Sit Supine to Sit: 5: Supervision;HOB flat Transfers Sit to Stand: 5: Supervision;With upper extremity assist;From bed Stand to Sit: 5: Supervision;With upper extremity assist;To chair/3-in-1 Details for Transfer Assistance: pt with safe technique           End of Session OT - End of Session Activity Tolerance: Patient tolerated treatment well Patient left:  (ON BSC with call bell) Nurse Communication:  (Pt on [facility], needs to walk)        Evette Georges [phone] 08/21/2012, 8:58 AM

## 2012-08-22 DIAGNOSIS — I428 Other cardiomyopathies: Secondary | ICD-10-CM

## 2012-08-22 DIAGNOSIS — I4891 Unspecified atrial fibrillation: Secondary | ICD-10-CM

## 2012-08-22 DIAGNOSIS — I517 Cardiomegaly: Secondary | ICD-10-CM

## 2012-08-22 DIAGNOSIS — R509 Fever, unspecified: Secondary | ICD-10-CM

## 2012-08-22 LAB — COMPREHENSIVE METABOLIC PANEL
ALT: 69 U/L — ABNORMAL HIGH (ref 0–35)
AST: 56 U/L — ABNORMAL HIGH (ref 0–37)
Albumin: 2.5 g/dL — ABNORMAL LOW (ref 3.5–5.2)
CO2: 26 mEq/L (ref 19–32)
Calcium: 10 mg/dL (ref 8.4–10.5)
Creatinine, Ser: 0.69 mg/dL (ref 0.50–1.10)
GFR calc non Af Amer: 86 mL/min — ABNORMAL LOW (ref >90)
Sodium: 136 mEq/L (ref 135–145)
Total Protein: 6.3 g/dL (ref 6.0–8.3)

## 2012-08-22 LAB — CBC
MCH: 27.2 pg (ref 26.0–34.0)
Platelets: 272 10*3/uL (ref 150–400)
RBC: 3.09 MIL/uL — ABNORMAL LOW (ref 3.87–5.11)
RDW: 14.1 % (ref 11.5–15.5)

## 2012-08-22 LAB — THYROID PEROXIDASE ANTIBODY: Thyroperoxidase Ab SerPl-aCnc: 21.1 IU/mL (ref <35.0)

## 2012-08-22 NOTE — Progress Notes (Signed)
Occupational Therapy Treatment Patient Details Name: [name] [name] MRN: [id number] DOB: [birth year] Today's Date: 08/22/2012 Time: 7829-5621 OT Time Calculation (min): 23 min  OT Assessment / Plan / Recommendation Comments on Treatment Session Pt is progressing.  Pt was Mod I for all task completed during session today.  Pt is motivated to do for herself.      Follow Up Recommendations  Home health OT       Equipment Recommendations  None recommended by OT       Frequency Min 2X/week   Plan Discharge plan remains appropriate    Precautions / Restrictions Precautions Precautions: None Restrictions Weight Bearing Restrictions: No       ADL  Grooming: Performed;Wash/dry hands;Modified independent Where Assessed - Grooming: Unsupported standing Lower Body Bathing: Simulated;Modified independent Where Assessed - Lower Body Bathing: Unsupported sit to stand Lower Body Dressing: Performed;Modified independent Where Assessed - Lower Body Dressing: Unsupported sit to stand Toilet Transfer: Performed;Modified independent Toilet Transfer Method: Sit to stand Toilet Transfer Equipment: Raised toilet seat with arms (or 3-in-1 over toilet) Toileting - Clothing Manipulation and Hygiene: Performed;Modified independent Where Assessed - Toileting Clothing Manipulation and Hygiene: Standing Equipment Used: Other (comment) (none) Transfers/Ambulation Related to ADLs: Pt ambulated from bed>bathroom>sink>bed all Mod I (pt moves slowly) ADL Comments: Mod I for all  ADLS      OT Goals ADL Goals ADL Goal: Grooming - Progress: Progressing toward goals ADL Goal: Lower Body Bathing - Progress: Progressing toward goals ADL Goal: Toilet Transfer - Progress: Progressing toward goals ADL Goal: Toileting - Clothing Manipulation - Progress: Progressing toward goals ADL Goal: Toileting - Hygiene - Progress: Progressing toward goals  Visit Information  Last OT Received On: 08/22/12 Assistance  Needed: +1    Subjective Data  Subjective: I had a terrible night(when asked how she is doing)      Copywriter, advertising Arousal/Alertness: Awake/alert Behavior During Therapy: WFL for tasks assessed/performed Overall Cognitive Status: Within Functional Limits for tasks assessed    Mobility  Bed Mobility Bed Mobility: Supine to Sit;Sitting - Scoot to Edge of Bed;Sit to Supine Supine to Sit: HOB elevated;With rails;6: Modified independent (Device/Increase time) Sitting - Scoot to Edge of Bed: 6: Modified independent (Device/Increase time) Sit to Supine: HOB flat;6: Modified independent (Device/Increase time) Details for Bed Mobility Assistance: No Vcs needed Transfers Transfers: Sit to Stand;Stand to Sit Sit to Stand: 6: Modified independent (Device/Increase time);From bed;From toilet;From chair/3-in-1;With armrests;From elevated surface Stand to Sit: 6: Modified independent (Device/Increase time);To toilet;To chair/3-in-1;To bed;With armrests;To elevated surface Details for Transfer Assistance: No VCs needed       Balance Balance Balance Assessed: Yes Dynamic Sitting Balance Dynamic Sitting - Balance Support: Feet supported;During functional activity Dynamic Sitting - Level of Assistance: 6: Modified independent (Device/Increase time) Dynamic Sitting - Comments: Donning socks and pants Dynamic Standing Balance Dynamic Standing - Balance Support: During functional activity Dynamic Standing - Level of Assistance: 6: Modified independent (Device/Increase time) Dynamic Standing - Comments: Completed donning pants and washing hands at sink   End of Session OT - End of Session Equipment Utilized During Treatment: Other (comment) (none needed) Activity Tolerance: Patient tolerated treatment well Patient left: in bed;with call bell/phone within reach Nurse Communication: Other (comment) (Output from pt)       Barnes Florek, [place] 08/22/2012, 12:08 PM

## 2012-08-22 NOTE — Consult Note (Signed)
CARDIOLOGY CONSULT NOTE  Patient ID: [name] [name] MRN: [id number] DOB/AGE: 01-04-1941 72 y.o.  Admit date: 08/19/2012 Primary Physician Minda Meo, MD Primary Cardiologist  Dr. Clifton  Chief Complaint  Fever  HPI:  Patient with recent mitral valve repair and Maze procedure by Dr. Cornelius Moras on 06/28/12.  She has a cardiomyopathy with an EF of 40%.  She has been treated with amiodarone and Xarelto.  Her amiodarone was stopped on 4/25 at an outpatient appt with Dr. Cornelius Moras.  She was recently started on antibiotics for UTI.  She presented to the ER on 4/27 just feeling poorly with complaints of back and abdominal pain.  She has had increased SOB, epigastric abdominal pain and chills with a temp of 102.6.  In the ER pro BNP was elevated.  She was treated with Lasix IV and antibiotics but in the ER she had a drop in her oxygen saturation and BP.  Abdominal CT and ultrasound have been normal.  She has had elevated liver enzymes.  She been found to have thyrotoxicosis.  Urine and blood cultures are thus far negative.    Plans include a possible thoracentesis to evaluate a pleural effusion as a possible source of infection.  We are called to consider a TEE to look for a possible source of infection.   She reports that she started feeling poorly one week ago with increasing fatigue.  She developed some shaking and subsequent neck fullness, body ache and eventual vomitting.  She did not report a fever at home and she did not think that she had any pyuria or dysuria.  She was having improvement in her incisional chest pain and no new SOB, PND or orthopnea.  She had no swelling. She feels great now.    Past Medical History  Diagnosis Date  . Hypertension     Then hypotension 04/2012 limiting med adjustment.  . Diverticulitis   . Anxiety   . Mitral valve prolapse     a. H/o MVP, with severe MR 04/2012, awaiting surgery.  . Hyperlipidemia     recently- taken off Crestor- due to pain in her legs, but  this was after cardiac cath. , pt. questioning whether the pain in her legs was related to cath. or crestor  . Goiter   . GERD (gastroesophageal reflux disease)   . Aortic stenosis 05/19/2012    trivial by TEE  . PONV (postoperative nausea and vomiting)   . Atrial fibrillation     a. Dx 04/2012.  . Mitral regurgitation     a. Severe by TEE, pending surgery.  . Systolic CHF     a. EF 40% by studies 04/2012 (LV dysf felt 2/2 MR)  . Refusal of blood transfusions as patient is Jehovah's Witness   . Shortness of breath   . Pneumonia     treated /w antibiotic- [facility]- early Feb. 2014  . Chronic kidney disease     stricture- of urethra - treated /w Dr. Elige Radon  . Headache     h/o migraines   . S/P Maze operation for atrial fibrillation 06/28/2012    Complete bilateral atrial lesion set using cryothermy ablation with oversewing of LA appendage via right minithoracotomy  . S/P mitral valve repair 06/28/2012    Complex valvuloplasty including quadrangular resection of posterior leaflet, sliding leaflet plasty, artificial Gore-tex neocord placement x2 and 32mm Sorin Memo 3D ring annuloplasty via right mini thoracotomy     Past Surgical History  Procedure Laterality Date  . Uterine fibroid surgery  1988  . Ovarian cyst surgery  1988  . Tubal ligation  1974  . Tee without cardioversion  05/19/2012    Procedure: TRANSESOPHAGEAL ECHOCARDIOGRAM (TEE);  Surgeon: Dolores Patty, MD;  Location: [facility] ENDOSCOPY;  Service: Cardiovascular;  Laterality: N/A;  . Mitral valve repair Right 06/28/2012    Procedure: MINIMALLY INVASIVE MITRAL VALVE REPAIR (MVR);  Surgeon: Purcell Nails, MD;  Location: [facility] OR;  Service: Open Heart Surgery;  Laterality: Right;  . Minimally invasive maze procedure N/A 06/28/2012    Procedure: MINIMALLY INVASIVE MAZE PROCEDURE;  Surgeon: Purcell Nails, MD;  Location: [facility] OR;  Service: Open Heart Surgery;  Laterality: N/A;  . Intraoperative transesophageal echocardiogram N/A 06/28/2012     Procedure: INTRAOPERATIVE TRANSESOPHAGEAL ECHOCARDIOGRAM;  Surgeon: Purcell Nails, MD;  Location: [facility] OR;  Service: Open Heart Surgery;  Laterality: N/A;    Allergies  Allergen Reactions  . Sulfa Drugs Cross Reactors Other (See Comments)    Unknown.  . Aspirin Rash  . Naproxen Sodium Swelling and Rash   Prescriptions prior to admission  Medication Sig Dispense Refill  . acetaminophen (TYLENOL) 500 MG tablet Take 500 mg by mouth every 6 (six) hours as needed. For pain      . ALPRAZolam (XANAX) 0.25 MG tablet Take 0.25 mg by mouth 3 (three) times daily as needed for sleep. For anxiety      . docusate sodium 100 MG CAPS Take 200 mg by mouth 2 (two) times daily as needed for constipation.  10 capsule    . iron polysaccharides (NIFEREX) 150 MG capsule Take 150 mg by mouth daily after lunch.      . metoprolol tartrate (LOPRESSOR) 25 MG tablet Take 12.5 mg by mouth at bedtime.      . nitrofurantoin, macrocrystal-monohydrate, (MACROBID) 100 MG capsule Take 100 mg by mouth 2 (two) times daily.       . Probiotic Product (PROBIOTIC DAILY PO) Take 1 capsule by mouth daily.      . Rivaroxaban (XARELTO) 15 MG TABS tablet Take 1 tablet (15 mg total) by mouth daily with supper.  30 tablet  3  . traMADol (ULTRAM) 50 MG tablet Take 1-2 tablets (50-100 mg total) by mouth every 4 (four) hours as needed.  30 tablet     Family History  Problem Relation Age of Onset  . Colon cancer Father     History   Social History  . Marital Status: Divorced    Spouse Name: N/A    Number of Children: N/A  . Years of Education: N/A   Occupational History  . Not on file.   Social History Main Topics  . Smoking status: Never Smoker   . Smokeless tobacco: Never Used  . Alcohol Use: No     Comment: 1 drink a month  . Drug Use: No  . Sexually Active: Not on file   Other Topics Concern  . Not on file   Social History Narrative   Divorced - lives alone - remains functionally independent     ROS:  As stated in  the HPI and negative for all other systems.  Physical Exam: Blood pressure 137/57, pulse 99, temperature 97.4 F (36.3 C), temperature source Oral, resp. rate 16, height 5\' 4"  (1.626 m), weight 122 lb (55.339 kg), SpO2 98.00%.  GENERAL:  Well appearing HEENT:  Pupils equal round and reactive, fundi not visualized, oral mucosa unremarkable NECK:  No jugular venous distention, waveform within normal limits, carotid upstroke brisk and symmetric,  no bruits, no thyromegaly LYMPHATICS:  No cervical, inguinal adenopathy LUNGS:  Clear to auscultation bilaterally BACK:  No CVA tenderness CHEST:  Well healed surgical scar.  HEART:  PMI not displaced or sustained,S1 and S2 within normal limits, no S3, no S4, no clicks, no rubs, no murmurs ABD:  Flat, positive bowel sounds normal in frequency in pitch, no bruits, no rebound, no guarding, no midline pulsatile mass, no hepatomegaly, no splenomegaly EXT:  2 plus pulses throughout, no edema, no cyanosis no clubbing SKIN:  No rashes no nodules NEURO:  Cranial nerves II through XII grossly intact, motor grossly intact throughout PSYCH:  Cognitively intact, oriented to person place and time   Labs: Lab Results  Component Value Date   BUN 12 08/22/2012   Lab Results  Component Value Date   CREATININE 0.69 08/22/2012   Lab Results  Component Value Date   NA 136 08/22/2012   K 3.8 08/22/2012   CL 102 08/22/2012   CO2 26 08/22/2012   Lab Results  Component Value Date   CKTOTAL 13 08/20/2012   CKMB 1.3 08/20/2012   TROPONINI <0.30 08/20/2012   Lab Results  Component Value Date   WBC 9.1 08/22/2012   HGB 8.4* 08/22/2012   HCT 25.5* 08/22/2012   MCV 82.5 08/22/2012   PLT 272 08/22/2012   No results found for this basename: CHOL, HDL, LDLCALC, LDLDIRECT, TRIG, CHOLHDL   Lab Results  Component Value Date   ALT 69* 08/22/2012   AST 56* 08/22/2012   ALKPHOS 250* 08/22/2012   BILITOT 0.2* 08/22/2012    EKG:  Sinus tachycardia with rate 117.  PACs.  No  acute ST T wave changes.  08/22/2012  ASSESSMENT AND PLAN:   FEVER:  I will start with a TTE and keep NPO for possible TEE in the AM pending the results of the TTE and review by Dr. Clifton Teddie Curd.   No obvious source at this point.  No clear indications of endocarditis by exam or history.  ATRIAL FIBRILLATION:  Off of amiodarone.  Maintaining NSR  CARDIOMYOPATHY:  Good urine output.  She says that her breathing is back to baseline and she seems to be euvolemic.    HYPERTHYROIDISM:  Many of her symptoms could be related to this.  She is now off the amio and on Tapazole.  I agree with this.   SignedRollene Rotunda 08/22/2012, 12:12 PM

## 2012-08-22 NOTE — Progress Notes (Signed)
*  PRELIMINARY RESULTS* Echocardiogram 2D Echocardiogram has been performed.  [name] [name] 08/22/2012, 4:29 PM

## 2012-08-22 NOTE — Progress Notes (Signed)
Subjective: Seems better, good pos, anxiuos--no pain, no n/v  Objective: Vital signs in last 24 hours: Temp:  [97.4 F (36.3 C)-97.8 F (36.6 C)] 97.4 F (36.3 C) (04/29 0545) Pulse Rate:  [91-99] 99 (04/29 0545) Resp:  [16-17] 16 (04/29 0545) BP: (101-137)/(34-57) 137/57 mmHg (04/29 0545) SpO2:  [96 %-98 %] 98 % (04/29 0545) Weight:  [55.339 kg (122 lb)] 55.339 kg (122 lb) (04/29 0545) Weight change: 0.635 kg (1 lb 6.4 oz)  CBG (last 3)  No results found for this basename: GLUCAP,  in the last 72 hours  Intake/Output from previous day: 04/28 0701 - 04/29 0700 In: 660 [P.O.:560; IV Piggyback:100] Out: 1800 [Urine:1800]  Physical Exam:  General appearance: alert and less anxious, but still mildly tremulous  Eyes: no scleral icterus  Throat: oropharynx moist without erythema; bilateral right>left goiter  Resp: decreased breath sounds right lung base  Cardio: regular rate and rhythm  GI: soft, non-tender; bowel sounds normal; no masses, no organomegaly  Extremities: no clubbing, cyanosis or edema; no peripheral stigmata of endocarditis      Lab Results:  Recent Labs  08/21/12 0635 08/22/12 0633  NA 138 136  K 3.4* 3.8  CL 102 102  CO2 29 26  GLUCOSE 103* 121*  BUN 10 12  CREATININE 0.86 0.69  CALCIUM 9.6 10.0    Recent Labs  08/21/12 0635 08/22/12 0633  AST 36 56*  ALT 50* 69*  ALKPHOS 211* 250*  BILITOT 0.3 0.2*  PROT 6.0 6.3  ALBUMIN 2.3* 2.5*    Recent Labs  08/21/12 0635 08/22/12 0633  WBC 5.8 9.1  HGB 8.6* 8.4*  HCT 25.5* 25.5*  MCV 82.5 82.5  PLT 247 272   Lab Results  Component Value Date   INR 1.02 06/15/2012   INR 1.24 05/15/2012   INR 1.15 05/15/2012    Recent Labs  08/19/12 2102 08/20/12 0810 08/20/12 1527 08/20/12 2015  CKTOTAL  --  13  --   --   CKMB  --  1.3  --   --   TROPONINI <0.30 <0.30 <0.30 <0.30    Recent Labs  08/20/12 0810  TSH 0.010*    Recent Labs  08/20/12 0810  VITAMINB12 557  FOLATE 15.9   FERRITIN 488*  TIBC 219*  IRON 11*  RETICCTPCT 1.6    Studies/Results: Dg Chest Right Decubitus  08/21/2012  *RADIOLOGY REPORT*  Clinical Data: Right pleural effusion, chest pain.  CHEST - RIGHT DECUBITUS  Comparison: CT 08/20/2012  Findings: The right decubitus view of the chest demonstrates moderate to layering right pleural effusion.  IMPRESSION: Moderate layering right pleural effusion.   Original Report Authenticated By: Charlett Nose, M.D.      Assessment/Plan: 1. Fever- fever of 102.6 on admission has resolved. May be secondary to UTI though differential includes infected pleural effusion, SBE, thyrotoxicosis. No findings (murmur or peripheral stigmata) to suggest endocarditis though she did have recent mitral valvuloplasty. Will await blood cultures and consider Echo/TEE if positive. Consider thoracentesis. Thyrotoxicosis may also be contributing to fever. Continue empiric Zosyn pending culture results.  2. Right pleural effusion- more apparent on CT than CXR. Obtain lateral decubitus X-ray to evaluate further and consider thoracentesis pending results. She is not having much SOB or chest pain today and no further hypoxia.  3. 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 Obtain thyroid uptake scan, Free T4 and Anti-TPO antibodies. Consider Methimazole and Beta Blocker pending results- this may be contributing to her anxiety, tremor and general  sense of feeling poorly (myalgias, etc.).  4. Nausea/Epigastric abdominal pain- heme check stools and consider GI evaluation for EGD if positive given Xeralto use, anemia. Continue PPI BID. Add Maalox.  5. Elevated LFTs-she has new onset elevated liver function test which may be secondary to Macrobid, amiodarone, thyrotoxicosis or related to her underlying issue. CT and ultrasound are unrevealing. Slight improvement today.  4. Systolic CHF-improved BNP after Lasix X 1. Will hold on additional Lasix due to marginal BP. Resume  BBlocker when BP stable.  5. UTI/microscopic hematuria- continue Zosyn. Urine and blood cultures are pending.  6. Generalized anxiety-anxiety appears to be compounding her presentation. We will use Xanax as needed for anxiety.  7. MR s/p Mitral Valvuloplasty- She saw Dr. Cornelius Moras this past week with no apparent complications. Consider TEE if fever recurrent or if BP positive.  8. Afib s/p MAZE procedure on anticoagulation-she is currently in sinus rhythm. Continues Xeralto for anticoagulation. Her amiodarone has been discontinued.  9. Anemia- obtain anemia panel and continue iron. 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 If heme positive will hold Xeralto.    Will consult cards for TEE Await addl thyroid levels   LOS: 3 days   Linette Gunderson A 08/22/2012, 12:18 PM

## 2012-08-22 NOTE — Progress Notes (Signed)
I have reviewed the note and agree with its content.  Ignacia Palma, [place] [phone] 08/22/2012

## 2012-08-23 ENCOUNTER — Inpatient Hospital Stay (HOSPITAL_COMMUNITY): Payer: Medicare Other

## 2012-08-23 DIAGNOSIS — I5021 Acute systolic (congestive) heart failure: Secondary | ICD-10-CM

## 2012-08-23 LAB — CBC
HCT: 25 % — ABNORMAL LOW (ref 36.0–46.0)
Hemoglobin: 8.2 g/dL — ABNORMAL LOW (ref 12.0–15.0)
MCH: 27.3 pg (ref 26.0–34.0)
MCHC: 32.8 g/dL (ref 30.0–36.0)
MCV: 83.3 fL (ref 78.0–100.0)
RBC: 3 MIL/uL — ABNORMAL LOW (ref 3.87–5.11)

## 2012-08-23 LAB — COMPREHENSIVE METABOLIC PANEL
ALT: 58 U/L — ABNORMAL HIGH (ref 0–35)
BUN: 13 mg/dL (ref 6–23)
CO2: 27 mEq/L (ref 19–32)
Calcium: 10 mg/dL (ref 8.4–10.5)
Creatinine, Ser: 0.77 mg/dL (ref 0.50–1.10)
GFR calc Af Amer: >90 mL/min (ref >90)
GFR calc non Af Amer: 83 mL/min — ABNORMAL LOW (ref >90)
Glucose, Bld: 103 mg/dL — ABNORMAL HIGH (ref 70–99)
Total Protein: 6 g/dL (ref 6.0–8.3)

## 2012-08-23 NOTE — Progress Notes (Signed)
Subjective: Patient seems to be doing much better. She still feels anxious but is having no chest pain or shortness of breath. She denies abdominal pain nausea vomiting. She does feel jittery. She remains worried about her condition but we did discuss all issues thoroughly.  Objective: Vital signs in last 24 hours: Temp:  [97.3 F (36.3 C)-98 F (36.7 C)] 97.3 F (36.3 C) (04/30 0634) Pulse Rate:  [88-95] 88 (04/30 0634) Resp:  [16-18] 16 (04/30 0634) BP: (107-135)/(49-70) 135/70 mmHg (04/30 0634) SpO2:  [97 %-99 %] 99 % (04/30 0634) Weight:  [55.838 kg (123 lb 1.6 oz)] 55.838 kg (123 lb 1.6 oz) (04/30 0634) Weight change: 0.499 kg (1 lb 1.6 oz)  CBG (last 3)  No results found for this basename: GLUCAP,  in the last 72 hours  Intake/Output from previous day: 04/29 0701 - 04/30 0700 In: 1244 [P.O.:1244] Out: 3125 [Urine:3125]  Physical Exam: Patient is alert awake no distress discussing things commonly inappropriately Good facial symmetry extraocular movements intact No JVD or bruits Lungs clear with some mild diminished breath sounds particularly at the right base no wheezing rales or rhonchi Regular rate and rhythm normal S1-S2 no murmur no gallop Abdomen soft nontender good bowel sounds no hepatosplenomegaly no bruits Extremities without edema intact distal pulses calves soft and nontender Neurologically patient is completely intact nonlateralizing   Lab Results:  Recent Labs  08/22/12 0633 08/23/12 0412  NA 136 139  K 3.8 3.7  CL 102 106  CO2 26 27  GLUCOSE 121* 103*  BUN 12 13  CREATININE 0.69 0.77  CALCIUM 10.0 10.0    Recent Labs  08/22/12 0633 08/23/12 0412  AST 56* 39*  ALT 69* 58*  ALKPHOS 250* 215*  BILITOT 0.2* 0.2*  PROT 6.3 6.0  ALBUMIN 2.5* 2.5*    Recent Labs  08/22/12 0633 08/23/12 0412  WBC 9.1 13.1*  HGB 8.4* 8.2*  HCT 25.5* 25.0*  MCV 82.5 83.3  PLT 272 302   Lab Results  Component Value Date   INR 1.02 06/15/2012   INR  1.24 05/15/2012   INR 1.15 05/15/2012    Recent Labs  08/20/12 0810 08/20/12 1527 08/20/12 2015  CKTOTAL 13  --   --   CKMB 1.3  --   --   TROPONINI <0.30 <0.30 <0.30    Recent Labs  08/20/12 0810  TSH 0.010*    Recent Labs  08/20/12 0810  VITAMINB12 557  FOLATE 15.9  FERRITIN 488*  TIBC 219*  IRON 11*  RETICCTPCT 1.6    Studies/Results: Dg Chest Right Decubitus  08/21/2012  *RADIOLOGY REPORT*  Clinical Data: Right pleural effusion, chest pain.  CHEST - RIGHT DECUBITUS  Comparison: CT 08/20/2012  Findings: The right decubitus view of the chest demonstrates moderate to layering right pleural effusion.  IMPRESSION: Moderate layering right pleural effusion.   Original Report Authenticated By: Charlett Nose, M.D.      Assessment/Plan: #1 febrile illness likely partially treated UTI currently responded clinically afebrile albeit white count has jumped will recheck in the morning  #2 atrial fibrillation off of amiodarone a normal sinus rhythm and off of anticoagulation at this time will await cardiology input regarding further anticoagulation versus aspirin therapy alone  #3 ischemic cardiomyopathy well compensated does have her residual right pleural effusion  #4 hyperthyroidism with goiter right greater than left Will check ultrasound clearly has changed in size but no discrete nodules and is arty on Tapazole and again many his symptoms may be related  to this  #5 mitral regurgitation status post mitral valvuloplasty well compensated decision regarding TEE per lumbar cardiology today initial echocardiogram looks great and initial blood cultures are negative  #6 atrial fibrillation status post Maze procedure again decision regarding anticoagulation up to cardiology  #7 anemia stool is heme-negative does not desire blood products as she is [employer]hov[employer]'s Witness  Plan decision regarding TEE is up to cardiology today should this be negative and not felt to be essential we'll  discontinue antibiotics given the negative cultures and afebrile state, continue to treat the thyroid disease and ultimately potential discharge in the morning   LOS: 4 days   Zaakirah Kistner A 08/23/2012, 7:35 AM

## 2012-08-23 NOTE — Progress Notes (Signed)
Physical Therapy Treatment Patient Details Name: [name] [name] MRN: [id number] DOB: May 08, 1940 Today's Date: 08/23/2012 Time: 0154-0210 PT Time Calculation (min): 16 min  PT Assessment / Plan / Recommendation Comments on Treatment Session  Pt moving well.      Follow Up Recommendations  Home health PT;Supervision - Intermittent     Does the patient have the potential to tolerate intense rehabilitation     Barriers to Discharge        Equipment Recommendations  None recommended by PT    Recommendations for Other Services    Frequency Min 2X/week   Plan Discharge plan remains appropriate    Precautions / Restrictions Precautions Precautions: None Restrictions Weight Bearing Restrictions: No       Mobility  Bed Mobility Bed Mobility: Supine to Sit;Sitting - Scoot to Edge of Bed;Sit to Supine Supine to Sit: 7: Independent Sitting - Scoot to Edge of Bed: 7: Independent Sit to Supine: 7: Independent Transfers Transfers: Sit to Stand;Stand to Sit Sit to Stand: 6: Modified independent (Device/Increase time);From bed Stand to Sit: 6: Modified independent (Device/Increase time);To bed Ambulation/Gait Ambulation/Gait Assistance: 5: Supervision Ambulation Distance (Feet): 400 Feet Assistive device: None Ambulation/Gait Assistance Details: Performed gait challenges & higher level balance activities with no LOB noted.   Gait Pattern: Within Functional Limits Gait velocity: wfl Stairs: Yes Stairs Assistance: 5: Supervision Stair Management Technique: One rail Left;Alternating pattern Number of Stairs: 5 (limited by IV pole) Wheelchair Mobility Wheelchair Mobility: No    Exercises     PT Diagnosis:    PT Problem List:   PT Treatment Interventions:     PT Goals Acute Rehab PT Goals Time For Goal Achievement: 08/28/12 Potential to Achieve Goals: Good Pt will go Supine/Side to Sit: with modified independence PT Goal: Supine/Side to Sit - Progress: Met Pt will go  Sit to Stand: with modified independence;with upper extremity assist PT Goal: Sit to Stand - Progress: Met Pt will Ambulate: >150 feet;with modified independence;with least restrictive assistive device PT Goal: Ambulate - Progress: Progressing toward goal Pt will Go Up / Down Stairs: Flight;with modified independence;with rail(s) PT Goal: Up/Down Stairs - Progress: Progressing toward goal  Visit Information  Last PT Received On: 08/23/12 Assistance Needed: +1    Subjective Data      Cognition  Cognition Arousal/Alertness: Awake/alert Behavior During Therapy: WFL for tasks assessed/performed Overall Cognitive Status: Within Functional Limits for tasks assessed    Balance  Balance Balance Assessed: Yes High Level Balance High Level Balance Activites: Side stepping;Backward walking;Direction changes;Turns;Sudden stops;Head turns High Level Balance Comments: No LOB noted.    End of Session PT - End of Session Equipment Utilized During Treatment: Gait belt Activity Tolerance: Patient tolerated treatment well Patient left: in bed;with call bell/phone within reach Nurse Communication: Mobility status     Verdell Face, [place] [phone] 08/23/2012

## 2012-08-23 NOTE — Progress Notes (Addendum)
SUBJECTIVE: Feeling better. No fevers, chills. No chest pain or SOB.   BP 135/70  Pulse 88  Temp(Src) 97.3 F (36.3 C) (Oral)  Resp 16  Ht 5\' 4"  (1.626 m)  Wt 123 lb 1.6 oz (55.838 kg)  BMI 21.12 kg/m2  SpO2 99%  Intake/Output Summary (Last 24 hours) at 08/23/12 0758 Last data filed at 08/23/12 0454  Gross per 24 hour  Intake   1244 ml  Output   3125 ml  Net  -1881 ml    PHYSICAL EXAM General: Well developed, well nourished, in no acute distress. Alert and oriented x 3.  Psych:  Good affect, responds appropriately Neck: No JVD. No masses noted.  Lungs: Clear bilaterally with no wheezes or rhonci noted.  Heart: RRR with no murmurs noted. Abdomen: Bowel sounds are present. Soft, non-tender.  Extremities: No lower extremity edema.   LABS: Basic Metabolic Panel:  Recent Labs  09/81/19 0633 08/23/12 0412  NA 136 139  K 3.8 3.7  CL 102 106  CO2 26 27  GLUCOSE 121* 103*  BUN 12 13  CREATININE 0.69 0.77  CALCIUM 10.0 10.0   CBC:  Recent Labs  08/22/12 0633 08/23/12 0412  WBC 9.1 13.1*  HGB 8.4* 8.2*  HCT 25.5* 25.0*  MCV 82.5 83.3  PLT 272 302   Cardiac Enzymes:  Recent Labs  08/20/12 0810 08/20/12 1527 08/20/12 2015  CKTOTAL 13  --   --   CKMB 1.3  --   --   TROPONINI <0.30 <0.30 <0.30    Current Meds: . docusate sodium  100 mg Oral BID  . iron polysaccharides  150 mg Oral QPC lunch  . methimazole  10 mg Oral BID  . pantoprazole  40 mg Oral BID  . piperacillin-tazobactam (ZOSYN)  IV  3.375 g Intravenous Q8H  . predniSONE  40 mg Oral QAC breakfast  . sodium chloride  3 mL Intravenous Q12H   Echo 08/22/12: Left ventricle: Hypokinesis at the base of the inferior wall. The cavity size was normal. Wall thickness was increased in a pattern of mild LVH. The estimated ejection fraction was 55%. - Mitral valve: The mitral valve repair is intact. There is no MR or MS. Valve area by pressure half-time: 2.47cm^2. - Left atrium: The atrium was  mildly dilated.  ASSESSMENT AND PLAN: 72 yo female with recent mitral valve repair and Maze procedure by Dr. Cornelius Moras on 06/28/12. She has a cardiomyopathy with an EF of 40%. She has been treated with amiodarone and Xarelto. Her amiodarone was stopped on 4/25 at an outpatient appt with Dr. Cornelius Moras. She was recently started on antibiotics for UTI. She presented to the ER on 4/27 just feeling poorly with complaints of back and abdominal pain. She has had increased SOB, epigastric abdominal pain and chills with a temp of 102.6. In the ER pro BNP was elevated. She was treated with Lasix IV and antibiotics but in the ER she had a drop in her oxygen saturation and BP. Abdominal CT and ultrasound have been normal. She has had elevated liver enzymes. She been found to have thyrotoxicosis. Urine and blood cultures are thus far negative. Consultation 08/22/12 by Dr. Antoine Poche.    1. FEVER: No obvious source at this point. Likely due to her thyroid abnormality. No clear indications of endocarditis by exam or history. Her transthoracic echo does not show any mitral regurgitation. TEE is not indicated. She can eat this am.   2. ATRIAL FIBRILLATION: Off of amiodarone.  Maintaining NSR. I would not restart her Xarelto at this point with her anemia. I will discuss further at f/u appointments. Consider restarting Lopressor 12.5 mg po BID.   3. NON-ISCHEMIC CARDIOMYOPATHY: Good urine output. She says that her breathing is back to baseline and she seems to be euvolemic.   4. HYPERTHYROIDISM: Many of her symptoms could be related to this. She is now off the amio and on Tapazole.   OK for discharge from our standpoint.     MCALHANY,CHRISTOPHER  4/30/20147:58 AM

## 2012-08-24 LAB — CBC
HCT: 27.9 % — ABNORMAL LOW (ref 36.0–46.0)
Hemoglobin: 9.3 g/dL — ABNORMAL LOW (ref 12.0–15.0)
MCHC: 33.3 g/dL (ref 30.0–36.0)
WBC: 15.2 10*3/uL — ABNORMAL HIGH (ref 4.0–10.5)

## 2012-08-24 LAB — COMPREHENSIVE METABOLIC PANEL
ALT: 61 U/L — ABNORMAL HIGH (ref 0–35)
Albumin: 2.6 g/dL — ABNORMAL LOW (ref 3.5–5.2)
Alkaline Phosphatase: 211 U/L — ABNORMAL HIGH (ref 39–117)
BUN: 15 mg/dL (ref 6–23)
Chloride: 103 mEq/L (ref 96–112)
GFR calc Af Amer: >90 mL/min (ref >90)
Glucose, Bld: 89 mg/dL (ref 70–99)
Potassium: 3.5 mEq/L (ref 3.5–5.1)
Sodium: 139 mEq/L (ref 135–145)
Total Bilirubin: 0.3 mg/dL (ref 0.3–1.2)
Total Protein: 6.4 g/dL (ref 6.0–8.3)

## 2012-08-24 MED ORDER — PREDNISONE 20 MG PO TABS: 10.0000 mg | ORAL_TABLET | Freq: Four times a day (QID) | ORAL | Status: DC

## 2012-08-24 MED ORDER — METHIMAZOLE 10 MG PO TABS: 10.0000 mg | ORAL_TABLET | Freq: Two times a day (BID) | ORAL | Status: DC

## 2012-08-24 MED ORDER — PANTOPRAZOLE SODIUM 40 MG PO TBEC: 40.0000 mg | DELAYED_RELEASE_TABLET | Freq: Two times a day (BID) | ORAL | Status: DC

## 2012-08-24 NOTE — Care Management Note (Signed)
    Page 1 of 1   08/24/2012     11:37:57 AM   CARE MANAGEMENT NOTE 08/24/2012  Patient:  [name] [name], [name] [name]   Account Number:  [id number]  Date Initiated:  08/24/2012  Documentation initiated by:  [facility]  Subjective/Objective Assessment:   72 yo female admitted with hypoxia     Action/Plan:   Home alone (daughter suppport)/ home with home health   Anticipated DC Date:  08/24/2012   Anticipated DC Plan:  HOME W HOME HEALTH SERVICES      DC Planning Services  CM consult      [facility] Choice  HOME HEALTH   Choice offered to / List presented to:  C-1 Patient        HH arranged  HH-1 RN  HH-10 DISEASE MANAGEMENT      HH agency  [facility]   Status of service:  Completed, signed off Medicare Important Message given?   (If response is "NO", the following Medicare IM given date fields will be blank) Date Medicare IM given:   Date Additional Medicare IM given:    Discharge Disposition:  HOME W HOME HEALTH SERVICES  Per UR Regulation:  Reviewed for med. necessity/level of care/duration of stay  If discussed at Long Length of Stay Meetings, dates discussed:    Comments:  08/24/12 @ 1115 Oletta Cohn, RN, BSN, [employer] [phone] Spoke with pt and daughter about discharge planning. Offered them [facility] List choices.  Pt chose [facility] to render services.  Kizzie Furnish of [facility] notified.

## 2012-08-24 NOTE — Discharge Summary (Signed)
DISCHARGE SUMMARY  JASZMINE NAVEJAS  MR#: [id number]  DOB:28-Nov-1940  Date of Admission: 08/19/2012 Date of Discharge: 08/24/2012  Attending Physician:Sitlaly Gudiel A  Patient's WUJ:WJXBJYN,WGNFAOZ A, MD  Consults:Treatment Team:  Rounding Lbcardiology, MD  Discharge Diagnoses: Active Problems:   Nausea   Anxiety   Atrial fibrillation   S/P mitral valve repair   S/P Maze operation for atrial fibrillation   Acute thoracic back pain   Hypoxia   Dyspnea   Abdominal pain   Elevated liver function tests   Systolic congestive heart failure   UTI (urinary tract infection)   Microscopic hematuria   Amiodarone Induced Thyrotoxicosis   Fever, unspecified   Thyroid goiter   Pleural effusion, right   Discharge Medications:   Medication List    STOP taking these medications       nitrofurantoin (macrocrystal-monohydrate) 100 MG capsule  Commonly known as:  MACROBID     PROBIOTIC DAILY PO     Rivaroxaban 15 MG Tabs tablet  Commonly known as:  XARELTO      TAKE these medications       acetaminophen 500 MG tablet  Commonly known as:  TYLENOL  Take 500 mg by mouth every 6 (six) hours as needed. For pain     ALPRAZolam 0.25 MG tablet  Commonly known as:  XANAX  Take 0.25 mg by mouth 3 (three) times daily as needed for sleep. For anxiety     DSS 100 MG Caps  Take 200 mg by mouth 2 (two) times daily as needed for constipation.     iron polysaccharides 150 MG capsule  Commonly known as:  NIFEREX  Take 150 mg by mouth daily after lunch.     methimazole 10 MG tablet  Commonly known as:  TAPAZOLE  Take 1 tablet (10 mg total) by mouth 2 (two) times daily.     metoprolol tartrate 25 MG tablet  Commonly known as:  LOPRESSOR  Take 12.5 mg by mouth at bedtime.     pantoprazole 40 MG tablet  Commonly known as:  PROTONIX  Take 1 tablet (40 mg total) by mouth 2 (two) times daily.     predniSONE 20 MG tablet  Commonly known as:  DELTASONE  Take 0.5 tablets (10 mg  total) by mouth taper from 4 doses each day to 1 dose and stop.     traMADol 50 MG tablet  Commonly known as:  ULTRAM  Take 1-2 tablets (50-100 mg total) by mouth every 4 (four) hours as needed.        Hospital Procedures: Ct Angio Chest Pe W/cm &/or Wo Cm  08/20/2012  *RADIOLOGY REPORT*  Clinical Data: Back pain.  Hypoxia.  Recent mitral valve valvuloplasty.  CT ANGIOGRAPHY CHEST  Technique:  Multidetector CT imaging of the chest using the standard protocol during bolus administration of intravenous contrast. Multiplanar reconstructed images including MIPs were obtained and reviewed to evaluate the vascular anatomy.  Contrast: 80mL OMNIPAQUE IOHEXOL 350 MG/ML SOLN  Comparison: Chest CT 07/07/2010.  Findings:  Mediastinum: Study is slightly limited by patient respiratory motion.  With these limitations in mind, there is no evidence to suggest clinically relevant central, lobar or segmental sized pulmonary embolism.  Smaller subsegmental sized emboli cannot be entirely excluded. Heart size is mildly enlarged. There is no significant pericardial fluid, thickening or pericardial calcification.  Status post mitral annuloplasty.  The entire left atrial appendage fails to opacified with IV contrast, compatible with complete thrombosis (postsurgical, as there was recent left atrial appendage  ligation).  There are numerous borderline enlarged and mildly enlarged mediastinal and bilateral hilar lymph nodes, presumably reactive.  The largest of these measures up to 1.2 cm in the low right paratracheal station.  There is atherosclerosis of the thoracic aorta, the great vessels of the mediastinum and the coronary arteries, including calcified atherosclerotic plaque in the left main, left anterior descending and left circumflex coronary arteries. Esophagus is unremarkable in appearance. Massive enlargement of the right lobe of the thyroid gland which is heterogeneous in appearance measuring up to approximately 5.6 x  4.6 cm, slightly increased compared to the prior study from 2012.  Lungs/Pleura: Moderate to large right-sided and small left-sided pleural effusions are simple in appearance layering dependently. These are associated with some passive atelectasis in the lower lobes of the lungs bilaterally.  Within the lungs there is a background of patchy mild ground-glass attenuation associated with intralobular septal thickening and thickening of the fissures, compatible with mild interstitial pulmonary edema.  No definite acute consolidative airspace disease.  Accurate assessment of the lung parenchyma for small pulmonary nodules is limited by considerable respiratory motion, but no large suspicious appearing pulmonary nodules or masses are noted on today's examination.  Upper Abdomen: Small gastric diverticulum incidentally noted.  Musculoskeletal: There are no aggressive appearing lytic or blastic lesions noted in the visualized portions of the skeleton.  IMPRESSION: 1.  Despite the limitations of today's examination there is no evidence to suggest clinically relevant central, lobar or segmental sized pulmonary embolism. 2.  The left atrial appendage fails to fill with contrast, compatible with ligation of the left atrial appendage during recent surgery on 06/28/2012. 3.  Mild interstitial pulmonary edema with moderate to large right pleural effusion and small left pleural effusion, with associated passive atelectasis in the lower lobes of the lungs bilaterally. 4.  Multiple borderline enlarged and mildly enlarged mediastinal and bilateral hilar lymph nodes are likely reactive in the setting of recent surgery. 5.  Persistent mass-like enlargement of the right lobe of the thyroid gland, as above.  This favored to represent an asymmetric goiter, however, correlation with a non emergent thyroid ultrasound is suggested. 6.  Status post minimal invasive mitral valve surgery and annuloplasty.   Original Report Authenticated By:  Trudie Reed, M.D.    US Soft Tissue Head/neck  08/23/2012  *RADIOLOGY REPORT*  Clinical Data: Hyperthyroidism.  THYROID ULTRASOUND  Technique: Ultrasound examination of the thyroid gland and adjacent soft tissues was performed.  Comparison:  Neck CT 07/07/2010 and thyroid ultrasound 09/04/2009.  Findings:  Right thyroid lobe:  Measures 7.8 x 4.1 x 4.2 cm. Left thyroid lobe:  Measures 4.8 x 2.5 x 1.9 cm. Isthmus:  Measures 0.35 cm.  Focal nodules:  Large dominant the solid thyroid nodule occupying most of the right thyroid lobe measuring 6.6 x 4.7 x 5.1 cm.  This appears relatively stable when compared the prior CT scan.  The left lobe demonstrates very heterogeneous nodular echotexture without discrete measurable nodule.  Lymphadenopathy:  None visualized.  IMPRESSION:  1.  Large dominant right thyroid mass, previously biopsied and not significantly changed since 2012. 2.  Heterogeneous nodular left thyroid lobe without discrete measurable mass.   Original Report Authenticated By: Rudie Meyer, M.D.    US Abdomen Complete  08/20/2012  *RADIOLOGY REPORT*  Clinical Data:  Reflux, indigestion, and back pain.  COMPLETE ABDOMINAL ULTRASOUND  Comparison:  CT abdomen and pelvis 04/24/2011.  Findings:  Gallbladder:  No gallstones, gallbladder wall thickening, or pericholecystic fluid.  Common bile  duct:  Normal caliber with diameter measured at 2.6 mm.  Liver:  No focal lesion identified.  Within normal limits in parenchymal echogenicity.  IVC:  Appears normal.  Pancreas:  No focal abnormality seen.  Spleen:  Spleen length measures 6.3 cm.  Normal parenchymal echotexture.  Right Kidney:  Right kidney measures 10.8 cm length.  No hydronephrosis.  There is a simple appearing cyst measuring up to 7.7 cm diameter.  This was present on the previous CT scan.  Left Kidney:  Left kidney measures left kidney measures 10.6 cm length.  No hydronephrosis.  Abdominal aorta:  No aneurysm identified.  A small right pleural  effusion is incidentally demonstrated.  IMPRESSION: Small right pleural effusion. Stable large right renal cyst.  No acute findings demonstrated in the abdomen or pelvis.   Original Report Authenticated By: Burman Nieves, M.D.    Dg Chest Right Decubitus  08/21/2012  *RADIOLOGY REPORT*  Clinical Data: Right pleural effusion, chest pain.  CHEST - RIGHT DECUBITUS  Comparison: CT 08/20/2012  Findings: The right decubitus view of the chest demonstrates moderate to layering right pleural effusion.  IMPRESSION: Moderate layering right pleural effusion.   Original Report Authenticated By: Charlett Nose, M.D.    Ct Abdomen Pelvis W Contrast  08/20/2012  *RADIOLOGY REPORT*  Clinical Data: Epigastric abdominal pain, nausea, vomiting, loose stools, chills.  CT ABDOMEN AND PELVIS WITH CONTRAST  Technique:  Multidetector CT imaging of the abdomen and pelvis was performed following the standard protocol during bolus administration of intravenous contrast.  Contrast: OMNIPAQUE IOHEXOL 300 MG/ML  SOLN  Comparison: 04/24/2011  Findings: Small bilateral pleural effusions with atelectasis in the lung bases, greater on the right side.  Diffuse cardiac enlargement.  Calcification in the mitral valve annulus.  Liver, spleen, gallbladder, pancreas, adrenal glands, inferior vena cava, retroperitoneal lymph nodes, stomach, small bowel, and colon are unremarkable.  Calcification of the abdominal aorta without aneurysm.  Cyst in the lower pole of the right kidney measuring up to 6.4 cm diameter.  This is stable since previous study.  No free air or free fluid in the abdomen.  Pelvis:  Bladder wall is not thickened.  The uterus is anteverted and appears somewhat prominent for the patient's age but is stable since the previous study.  No abnormal adnexal masses.  The appendix is normal.  Diverticula in the sigmoid colon without diverticulitis.  No free or loculated pelvic fluid collections.  No significant pelvic lymphadenopathy.   Diffuse bone demineralization. Degenerative changes in the lumbar spine.  No lumbar compression deformities.  Degenerative changes in the facet joints.  Pelvis, sacrum, and hips appear intact.  IMPRESSION: Small bilateral pleural effusions with basilar atelectasis or consolidation, greater on the right side.  No significant change in the appearance of the abdomen or pelvis since previous study.   Original Report Authenticated By: Burman Nieves, M.D.    Dg Chest Portable 1 View  08/19/2012  *RADIOLOGY REPORT*  Clinical Data: Sepsis  PORTABLE CHEST - 1 VIEW  Comparison: 07/17/2012  Findings: Cardiac leads obscure detail.  Mild cardiomegaly noted with evidence of mitral valvuloplasty.  Diffusely prominent interstitial markings are noted without focal pulmonary opacity. No pleural effusion.  No acute osseous finding.  IMPRESSION: Cardiomegaly with diffusely prominent interstitial markings which is nonspecific and could indicate early interstitial edema or atypical/viral infectious or inflammatory process.   Original Report Authenticated By: Christiana Pellant, M.D.     History of Present Illness:  Ms. Richardson Dopp is a 72 year old white female with  a history of mitral regurgitation status post recent mitral valvuloplasty (3/14), congestive heart failure (EF 40%), and atrial fibrillation status post Maze procedure on anticoagulation who presented to the emergency department with a multitude of complaints. Her history is very challenging in that she jumps from one issue to another but it seems that her presenting symptom was thoracic back pain and abdominal pain. She becomes very anxious when describing her symptoms. She was seen by the nurse practitioner at Dr. Lanell Matar office on 4/24 with similar symptoms. A chest x-ray was performed at that time that showed a small right pleural effusion which was improved. Urinalysis was suggestive of urinary tract infection and she was started on Macrobid. The following day she saw  Dr. Cornelius Moras we stopped her amiodarone that she been on for awhile. She states that since that time she's continued to feel worse with increased back pain, shortness of breath, epigastric abdominal pain, and chills. She presented to the emergency department for evaluation of these symptoms and underwent a chest x-ray that showed diffusely prominent interstitial markings (nonspecific). Abdominal ultrasound showed small right pleural effusion but no gallbladder abnormalities. CT of the abdomen and pelvis with contrast showed no acute findings. Labs are significant for a moderately elevated BNP 2456. Cardiac enzymes are negative. Lipase is normal to LFTs are mildly elevated.in the ER she has received a dose of Lasix40 mg IV, vancomycin, and Zosyn. I was called for admission.  As I was examining the patient she became extremely anxious and tremulous. Her oxygen saturations dropped to 80% on room air with a decrease in her blood pressure with systolic blood pressures in the 30s.   Hospital Course: Patient was admitted to telemetry appearing somewhat toxic and febrile. She clearly had a documented urinary tract infection  And unclear as to whether this was the entire picture but she was treated with IV Zosyn to complete her full course of antibiotics that had been initiated as an outpatient. She did defervesce initially the white count was normal but did bump prior to admission likely secondary to steroids as described below. All urine cultures and blood cultures were negative. Echocardiogram while showing some evidence of a mild cardiomyopathy showed no evidence of vegetations and nothing suggestive of endocarditis. Cardiology was consulted and patient was well compensated from a rhythm standpoint in normal sinus rhythm and from a valve standpoint with no evidence of endocarditis. It was not felt that a transesophageal echocardiogram was in order. She was in normal sinus rhythm and is not going to be maintained on  amiodarone or Zarontin oh both of which were discontinued we did find evidence of thyrotoxicosis and she was initiated on a tapering dose of steroids as well as empiric Tapazole. Thyroid ultrasound did demonstrate her chronic border which had been biopsied the past and is unchanged since 2012 with no evidence for a need for re\re investigation or repeat biopsy. Much of the clinical picture is presumed to be at this point amiodarone-induced thyrotoxicosis the. She is much improved prior to discharge. She does remain a bit anxious but overall improved. She no longer has a fever on Tapazole, steroids and having had her urinary tract infection fully treated. She is compensated from a cardiac standpoint with no clinical evidence of heart failure and a rhythm that is normal sinus rhythm in the 80s. We will reinitiate her Lopressor at a low dose in the outpatient setting and further titrate that when seen in the office. She is ambulating and eating and clearly  at baseline after 48 hours.  Day of Discharge Exam BP 122/65  Pulse 88  Temp(Src) 97.7 F (36.5 C) (Oral)  Resp 19  Ht 5\' 4"  (1.626 m)  Wt 54.568 kg (120 lb 4.8 oz)  BMI 20.64 kg/m2  SpO2 98%  Physical Exam: General appearance: alert, cooperative and no distress Eyes: no scleral icterus Throat: oropharynx moist without erythema Resp: clear to auscultation bilaterally Cardio: regular rate and rhythm, S1, S2 normal, no murmur, click, rub or gallop Extremities: no clubbing, cyanosis or edema  Discharge Labs:  Recent Labs  08/23/12 0412 08/24/12 0455  NA 139 139  K 3.7 3.5  CL 106 103  CO2 27 27  GLUCOSE 103* 89  BUN 13 15  CREATININE 0.77 0.79  CALCIUM 10.0 10.0    Recent Labs  08/23/12 0412 08/24/12 0455  AST 39* 38*  ALT 58* 61*  ALKPHOS 215* 211*  BILITOT 0.2* 0.3  PROT 6.0 6.4  ALBUMIN 2.5* 2.6*    Recent Labs  08/23/12 0412 08/24/12 0455  WBC 13.1* 15.2*  HGB 8.2* 9.3*  HCT 25.0* 27.9*  MCV 83.3 83.5  PLT 302  353   No results found for this basename: CKTOTAL, CKMB, CKMBINDEX, TROPONINI,  in the last 72 hours No results found for this basename: TSH, T4TOTAL, FREET3, T3FREE, THYROIDAB,  in the last 72 hours No results found for this basename: VITAMINB12, FOLATE, FERRITIN, TIBC, IRON, RETICCTPCT,  in the last 72 hours  Discharge instructions:     Discharge Orders   Future Appointments Provider Department Dept Phone   09/25/2012 1:30 PM Purcell Nails, MD [facility] [phone]   Future Orders Complete By Expires     Diet - low sodium heart healthy  As directed     Increase activity slowly  As directed        Disposition: Dr. [name] I Monday in the office  Follow-up Appts: Follow-up with Dr. [name] at [facility] in 1 week.  Call for appointment.  Condition on Discharge: Improved and stable condition  Tests Needing Follow-up: Lab work to be done on Monday to include a recheck CBC of note she is Jehov[employer]'s Witness and will allow transfusion, thyroid functions, renal function.  Signed: Catrinia Racicot A 08/24/2012, 7:22 AM

## 2012-08-24 NOTE — Progress Notes (Signed)
1130 discharge instructions given . Verbalized understanding . Wheeled to lobby by NT with daughter in attendance

## 2012-08-26 LAB — CULTURE, BLOOD (ROUTINE X 2)

## 2012-08-28 ENCOUNTER — Ambulatory Visit: Payer: Medicare Other | Admitting: Thoracic Surgery (Cardiothoracic Vascular Surgery)

## 2012-09-01 ENCOUNTER — Telehealth: Payer: Self-pay | Admitting: Cardiovascular Disease

## 2012-09-01 NOTE — Telephone Encounter (Signed)
Thanks, cdm 

## 2012-09-01 NOTE — Telephone Encounter (Signed)
Spoke with Darl Pikes from [facility]. She saw pt today and pt reported blood pressure last night of 80/32. Pt was not having any symptoms at that time. Pt held PM dose of metoprolol. This was the only time blood pressure had been that low. Darl Pikes saw pt today and reports pt's heart rate is consistently running 90-100. Feels regular. Blood pressure today by nurse was 130/70 and pt had checked it earlier today and it was 114/63.  Chart reviewed and pt's recent heart rates at office visits have been 88 and 100.  Darl Pikes will have pt continue to monitor heart rate and blood pressures and keep record of these.  She will call us in about a week with update.

## 2012-09-09 ENCOUNTER — Emergency Department (HOSPITAL_COMMUNITY): Payer: Medicare Other

## 2012-09-09 ENCOUNTER — Emergency Department (HOSPITAL_COMMUNITY)
Admission: EM | Admit: 2012-09-09 | Discharge: 2012-09-09 | Disposition: A | Discharge status: Home / Self Care | Payer: Medicare Other | Attending: Emergency Medicine | Admitting: Emergency Medicine

## 2012-09-09 ENCOUNTER — Other Ambulatory Visit: Payer: Self-pay

## 2012-09-09 ENCOUNTER — Encounter (HOSPITAL_COMMUNITY): Payer: Self-pay | Admitting: Emergency Medicine

## 2012-09-09 DIAGNOSIS — Z8701 Personal history of pneumonia (recurrent): Secondary | ICD-10-CM | POA: Insufficient documentation

## 2012-09-09 DIAGNOSIS — R29898 Other symptoms and signs involving the musculoskeletal system: Secondary | ICD-10-CM | POA: Insufficient documentation

## 2012-09-09 DIAGNOSIS — E049 Nontoxic goiter, unspecified: Secondary | ICD-10-CM | POA: Insufficient documentation

## 2012-09-09 DIAGNOSIS — Z8679 Personal history of other diseases of the circulatory system: Secondary | ICD-10-CM | POA: Insufficient documentation

## 2012-09-09 DIAGNOSIS — Z8639 Personal history of other endocrine, nutritional and metabolic disease: Secondary | ICD-10-CM | POA: Insufficient documentation

## 2012-09-09 DIAGNOSIS — R002 Palpitations: Secondary | ICD-10-CM | POA: Insufficient documentation

## 2012-09-09 DIAGNOSIS — R209 Unspecified disturbances of skin sensation: Secondary | ICD-10-CM | POA: Insufficient documentation

## 2012-09-09 DIAGNOSIS — K219 Gastro-esophageal reflux disease without esophagitis: Secondary | ICD-10-CM | POA: Insufficient documentation

## 2012-09-09 DIAGNOSIS — I5020 Unspecified systolic (congestive) heart failure: Secondary | ICD-10-CM | POA: Insufficient documentation

## 2012-09-09 DIAGNOSIS — Z8719 Personal history of other diseases of the digestive system: Secondary | ICD-10-CM | POA: Insufficient documentation

## 2012-09-09 DIAGNOSIS — F419 Anxiety disorder, unspecified: Secondary | ICD-10-CM

## 2012-09-09 DIAGNOSIS — E039 Hypothyroidism, unspecified: Secondary | ICD-10-CM | POA: Insufficient documentation

## 2012-09-09 DIAGNOSIS — Z79899 Other long term (current) drug therapy: Secondary | ICD-10-CM | POA: Insufficient documentation

## 2012-09-09 DIAGNOSIS — M6281 Muscle weakness (generalized): Secondary | ICD-10-CM | POA: Insufficient documentation

## 2012-09-09 DIAGNOSIS — N189 Chronic kidney disease, unspecified: Secondary | ICD-10-CM | POA: Insufficient documentation

## 2012-09-09 DIAGNOSIS — J029 Acute pharyngitis, unspecified: Secondary | ICD-10-CM | POA: Insufficient documentation

## 2012-09-09 DIAGNOSIS — E0590 Thyrotoxicosis, unspecified without thyrotoxic crisis or storm: Secondary | ICD-10-CM

## 2012-09-09 DIAGNOSIS — Z862 Personal history of diseases of the blood and blood-forming organs and certain disorders involving the immune mechanism: Secondary | ICD-10-CM | POA: Insufficient documentation

## 2012-09-09 DIAGNOSIS — I129 Hypertensive chronic kidney disease with stage 1 through stage 4 chronic kidney disease, or unspecified chronic kidney disease: Secondary | ICD-10-CM | POA: Insufficient documentation

## 2012-09-09 DIAGNOSIS — F411 Generalized anxiety disorder: Secondary | ICD-10-CM | POA: Insufficient documentation

## 2012-09-09 DIAGNOSIS — Z9889 Other specified postprocedural states: Secondary | ICD-10-CM | POA: Insufficient documentation

## 2012-09-09 DIAGNOSIS — M542 Cervicalgia: Secondary | ICD-10-CM | POA: Insufficient documentation

## 2012-09-09 LAB — CBC WITH DIFFERENTIAL/PLATELET
Basophils Absolute: 0 10*3/uL (ref 0.0–0.1)
HCT: 32 % — ABNORMAL LOW (ref 36.0–46.0)
Lymphocytes Relative: 11 % — ABNORMAL LOW (ref 12–46)
Monocytes Absolute: 0.8 10*3/uL (ref 0.1–1.0)
Neutro Abs: 7.5 10*3/uL (ref 1.7–7.7)
Neutrophils Relative %: 81 % — ABNORMAL HIGH (ref 43–77)
Platelets: 217 10*3/uL (ref 150–400)
RDW: 16 % — ABNORMAL HIGH (ref 11.5–15.5)
WBC: 9.3 10*3/uL (ref 4.0–10.5)

## 2012-09-09 LAB — COMPREHENSIVE METABOLIC PANEL
ALT: 18 U/L (ref 0–35)
AST: 19 U/L (ref 0–37)
Alkaline Phosphatase: 127 U/L — ABNORMAL HIGH (ref 39–117)
CO2: 24 mEq/L (ref 19–32)
Chloride: 99 mEq/L (ref 96–112)
GFR calc non Af Amer: 87 mL/min — ABNORMAL LOW (ref >90)
Potassium: 3.9 mEq/L (ref 3.5–5.1)
Sodium: 135 mEq/L (ref 135–145)
Total Bilirubin: 0.4 mg/dL (ref 0.3–1.2)

## 2012-09-09 LAB — URINALYSIS, ROUTINE W REFLEX MICROSCOPIC
Glucose, UA: NEGATIVE mg/dL
Leukocytes, UA: NEGATIVE
Nitrite: NEGATIVE
pH: 7.5 (ref 5.0–8.0)

## 2012-09-09 LAB — PRO B NATRIURETIC PEPTIDE: Pro B Natriuretic peptide (BNP): 1058 pg/mL — ABNORMAL HIGH (ref 0–125)

## 2012-09-09 LAB — URINE MICROSCOPIC-ADD ON

## 2012-09-09 LAB — T4, FREE: Free T4: 2.84 ng/dL — ABNORMAL HIGH (ref 0.80–1.80)

## 2012-09-09 MED ORDER — IOHEXOL 350 MG/ML SOLN
50.0000 mL | Freq: Once | INTRAVENOUS | Status: AC | PRN
  Administered 2012-09-09: 50 mL via INTRAVENOUS

## 2012-09-09 MED ORDER — METHIMAZOLE 10 MG PO TABS
20.0000 mg | ORAL_TABLET | ORAL | Status: AC
  Administered 2012-09-09: 20 mg via ORAL
  Filled 2012-09-09: qty 2

## 2012-09-09 MED ORDER — SODIUM CHLORIDE 0.9 % IV BOLUS (SEPSIS)
500.0000 mL | Freq: Once | INTRAVENOUS | Status: AC
  Administered 2012-09-09: 500 mL via INTRAVENOUS

## 2012-09-09 MED ORDER — METOPROLOL TARTRATE 1 MG/ML IV SOLN
5.0000 mg | Freq: Once | INTRAVENOUS | Status: DC
  Filled 2012-09-09: qty 5

## 2012-09-09 MED ORDER — LORAZEPAM 1 MG PO TABS
0.5000 mg | ORAL_TABLET | Freq: Once | ORAL | Status: AC
  Administered 2012-09-09: 0.5 mg via ORAL
  Filled 2012-09-09: qty 1

## 2012-09-09 MED ORDER — METOPROLOL TARTRATE 25 MG PO TABS: 25.0000 mg | ORAL_TABLET | Freq: Two times a day (BID) | ORAL | Status: DC

## 2012-09-09 MED ORDER — HYDROCORTISONE SOD SUCCINATE 100 MG IJ SOLR
100.0000 mg | Freq: Once | INTRAMUSCULAR | Status: AC
  Administered 2012-09-09: 100 mg via INTRAVENOUS
  Filled 2012-09-09: qty 2

## 2012-09-09 MED ORDER — ONDANSETRON HCL 4 MG/2ML IJ SOLN
4.0000 mg | Freq: Once | INTRAMUSCULAR | Status: AC
  Administered 2012-09-09: 4 mg via INTRAVENOUS
  Filled 2012-09-09: qty 2

## 2012-09-09 MED ORDER — METOPROLOL TARTRATE 1 MG/ML IV SOLN: 2.5000 mg | Freq: Once | INTRAVENOUS | Status: DC

## 2012-09-09 MED ORDER — METHIMAZOLE 10 MG PO TABS: 30.0000 mg | ORAL_TABLET | Freq: Every day | ORAL | Status: DC

## 2012-09-09 MED ORDER — HYDROCODONE-ACETAMINOPHEN 5-325 MG PO TABS
2.0000 | ORAL_TABLET | Freq: Once | ORAL | Status: AC
  Administered 2012-09-09: 2 via ORAL
  Filled 2012-09-09: qty 1

## 2012-09-09 NOTE — ED Provider Notes (Signed)
History     CSN: [id number]  Arrival date & time 09/09/12  1426   First MD Initiated Contact with Patient 09/09/12 1500      Chief Complaint  Patient presents with  . Shaking    (Consider location/radiation/quality/duration/timing/severity/associated sxs/prior treatment) HPI Comments: Patient presents with neck pain since 4:00 this morning. She's had this pain before and thinks she may have slept wrong. She denies any trauma. She denies any focal weakness, numbness or tingling. She is very anxious appearing her to keep on track of her history. 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 She was instructed by her doctor to take additional dose of her methimazole yesterday because she developed some shaking in her legs 2 days ago. She says the shaking her legs has improved. She is worried that her neck pain is related to her previous thyroid issues. She denies any chest pain, shortness of breath, cough or fever. No abdominal pain, nausea or vomiting.  76 y F with PMH of CKD, HTN, anxiety, afib s/p MAZE, systolic CHF and severe MR s/p valvuloplasty. xarelto was stopped during her last admission.  The history is provided by the patient.    Past Medical History  Diagnosis Date  . Hypertension     Then hypotension 04/2012 limiting med adjustment.  . Diverticulitis   . Anxiety   . Mitral valve prolapse     a. H/o MVP, with severe MR 04/2012, awaiting surgery.  . Hyperlipidemia     recently- taken off Crestor- due to pain in her legs, but this was after cardiac cath. , pt. questioning whether the pain in her legs was related to cath. or crestor  . Goiter   . GERD (gastroesophageal reflux disease)   . Aortic stenosis 05/19/2012    trivial by TEE  . PONV (postoperative nausea and vomiting)   . Atrial fibrillation     a. Dx 04/2012.  . Mitral regurgitation     a. Severe by TEE, pending surgery.  . Systolic CHF     a. EF 40% by studies  04/2012 (LV dysf felt 2/2 MR)  . Refusal of blood transfusions as patient is Jehovah's Witness   . Shortness of breath   . Pneumonia     treated /w antibiotic- [facility]- early Feb. 2014  . Chronic kidney disease     stricture- of urethra - treated /w Dr. Elige Radon  . Headache     h/o migraines   . S/P Maze operation for atrial fibrillation 06/28/2012    Complete bilateral atrial lesion set using cryothermy ablation with oversewing of LA appendage via right minithoracotomy  . S/P mitral valve repair 06/28/2012    Complex valvuloplasty including quadrangular resection of posterior leaflet, sliding leaflet plasty, artificial Gore-tex neocord placement x2 and 32mm Sorin Memo 3D ring annuloplasty via right mini thoracotomy     Past Surgical History  Procedure Laterality Date  . Uterine fibroid surgery  1988  . Ovarian cyst surgery  1988  . Tubal ligation  1974  . Tee without cardioversion  05/19/2012    Procedure: TRANSESOPHAGEAL ECHOCARDIOGRAM (TEE);  Surgeon: Dolores Patty, MD;  Location: [facility] ENDOSCOPY;  Service: Cardiovascular;  Laterality: N/A;  . Mitral valve repair Right 06/28/2012    Procedure: MINIMALLY INVASIVE MITRAL VALVE REPAIR (MVR);  Surgeon: Purcell Nails, MD;  Location: [facility] OR;  Service: Open Heart Surgery;  Laterality: Right;  . Minimally invasive maze procedure N/A 06/28/2012    Procedure:  MINIMALLY INVASIVE MAZE PROCEDURE;  Surgeon: Purcell Nails, MD;  Location: [facility] OR;  Service: Open Heart Surgery;  Laterality: N/A;  . Intraoperative transesophageal echocardiogram N/A 06/28/2012    Procedure: INTRAOPERATIVE TRANSESOPHAGEAL ECHOCARDIOGRAM;  Surgeon: Purcell Nails, MD;  Location: [facility] OR;  Service: Open Heart Surgery;  Laterality: N/A;    Family History  Problem Relation Age of Onset  . Colon cancer Father     History  Substance Use Topics  . Smoking status: Never Smoker   . Smokeless tobacco: Never Used  . Alcohol Use: No     Comment: 1 drink a month    OB History   Grav  Para Term Preterm Abortions TAB SAB Ect Mult Living                  Review of Systems  Constitutional: Negative for fever, activity change and appetite change.  HENT: Positive for sore throat and neck pain. Negative for congestion and rhinorrhea.   Respiratory: Negative for cough, chest tightness and shortness of breath.   Cardiovascular: Positive for palpitations. Negative for chest pain.  Gastrointestinal: Negative for nausea, vomiting and abdominal pain.  Genitourinary: Negative for dysuria and hematuria.  Musculoskeletal: Negative for back pain.  Neurological: Negative for dizziness, weakness and headaches.  A complete 10 system review of systems was obtained and all systems are negative except as noted in the HPI and PMH.    Allergies  Sulfa drugs cross reactors; Aspirin; and Naproxen sodium  Home Medications   Current Outpatient Rx  Name  Route  Sig  Dispense  Refill  . ALPRAZolam (XANAX) 0.25 MG tablet   Oral   Take 0.25 mg by mouth 3 (three) times daily as needed for sleep. For anxiety         . Docusate Sodium (DSS) 100 MG CAPS   Oral   Take 200 mg by mouth 2 (two) times daily as needed (for constipation.).         [name] methimazole (TAPAZOLE) 10 MG tablet   Oral   Take 1 tablet (10 mg total) by mouth 2 (two) times daily.   60 tablet   3   . metoprolol tartrate (LOPRESSOR) 25 MG tablet   Oral   Take 12.5 mg by mouth at bedtime.         . pantoprazole (PROTONIX) 40 MG tablet   Oral   Take 1 tablet (40 mg total) by mouth 2 (two) times daily.   60 tablet   3   . traMADol (ULTRAM) 50 MG tablet   Oral   Take 1-2 tablets (50-100 mg total) by mouth every 4 (four) hours as needed.   30 tablet      . acetaminophen (TYLENOL) 500 MG tablet   Oral   Take 1,000 mg by mouth every 6 (six) hours as needed for pain. For pain         . iron polysaccharides (NIFEREX) 150 MG capsule   Oral   Take 150 mg by mouth daily after lunch.           BP 122/64   Pulse 105  Temp(Src) 98.2 F (36.8 C) (Oral)  Resp 36  SpO2 99%  Physical Exam  Constitutional: She is oriented to person, place, and time. She appears well-developed and well-nourished. No distress.  HENT:  Head: Normocephalic and atraumatic.  Mouth/Throat: Oropharynx is clear and moist. No oropharyngeal exudate.  Eyes: EOM are normal. Pupils are equal, round, and reactive to light.  Neck: Normal range of motion. Neck supple. Thyromegaly present.  Enlarged thyroid, tender  Cardiovascular: Normal rate, regular rhythm and normal heart sounds.   No murmur heard. Pulmonary/Chest: Effort normal and breath sounds normal. No respiratory distress.  Abdominal: Soft. There is no tenderness. There is no rebound and no guarding.  Musculoskeletal: Normal range of motion. She exhibits no edema and no tenderness.  Neurological: She is alert and oriented to person, place, and time. No cranial nerve deficit. She exhibits normal muscle tone. Coordination normal.  Cranial nerves to 12 intact, 5 out of 5 strength throughout, no ataxia finger to nose  Skin: Skin is warm.    ED Course  Procedures (including critical care time)  Labs Reviewed  CBC WITH DIFFERENTIAL - Abnormal; Notable for the following:    RBC 3.71 (*)    Hemoglobin 10.3 (*)    HCT 32.0 (*)    RDW 16.0 (*)    Neutrophils Relative % 81 (*)    Lymphocytes Relative 11 (*)    All other components within normal limits  COMPREHENSIVE METABOLIC PANEL - Abnormal; Notable for the following:    Glucose, Bld 104 (*)    Albumin 3.0 (*)    Alkaline Phosphatase 127 (*)    GFR calc non Af Amer 87 (*)    All other components within normal limits  URINALYSIS, ROUTINE W REFLEX MICROSCOPIC - Abnormal; Notable for the following:    Hgb urine dipstick TRACE (*)    All other components within normal limits  PRO B NATRIURETIC PEPTIDE - Abnormal; Notable for the following:    Pro B Natriuretic peptide (BNP) 1058.0 (*)    All other components within  normal limits  D-DIMER, QUANTITATIVE - Abnormal; Notable for the following:    D-Dimer, Quant 1.18 (*)    All other components within normal limits  PROTIME-INR  TROPONIN I  URINE MICROSCOPIC-ADD ON  TSH  T4, FREE   Dg Chest 2 View  09/09/2012   *RADIOLOGY REPORT*  Clinical Data: Shaking.  Weakness.  Headache.  CHEST - 2 VIEW  Comparison: 08/19/2012 and today's CT, dictated separately.  Findings: Mild osteopenia.  Prior mitral valve repair.  Tracheal deviation left secondary to right-sided thyroid enlargement.  Biapical pleural thickening. No pleural effusion or pneumothorax.  No congestive failure.  Clear lungs.  IMPRESSION: Cardiomegaly, without acute disease.  Tracheal deviation to the left, secondary to right sided thyroid enlargement.   Original Report Authenticated By: Jeronimo Greaves, M.D.   Ct Angio Neck W/cm &/or Wo/cm  09/09/2012   *RADIOLOGY REPORT*  Clinical Data:  Left-sided chest pain.  Neck pain.  CT ANGIOGRAPHY NECK  Technique:  Multidetector CT imaging of the neck was performed using the standard protocol during bolus administration of intravenous contrast.  Multiplanar CT image reconstructions including MIPs were obtained to evaluate the vascular anatomy. Carotid stenosis measurements (when applicable) are obtained utilizing NASCET criteria, using the distal internal carotid diameter as the denominator.  Contrast: 50mL OMNIPAQUE IOHEXOL 350 MG/ML SOLN  Comparison:  Ultrasound neck most recent 08/23/2012.  Findings:  46 x 55 x 79 mm right lobe thyroid with heterogeneous attenuation, stable from 2012.  Moderately enlarged left lobe thyroid but not to the degree of that seen on the right.  Findings consistent with chronic liver.  No substernal extension.  Transverse arch atheromatous change without proximal great vessels stenosis or aberrant branching. Nonstenotic calcification of the carotid bifurcation.  No flow limiting proximal ICA stenosis or dissection.  Both vertebrals widely patent.  Left vertebral is dominant.  No flow limiting ostial lesion.  No vertebral dissection right or left.  Cervical spondylosis.  Skeletal osteopenia.  Visualized intracranial compartment negative.  No paranasal sinus disease.  No mastoid fluid.  No aggressive osseous lesions.  No cervical lymphadenopathy.  See chest CT dictation for additional findings.   Review of the MIP images confirms the above findings.  IMPRESSION: No carotid or vertebral extracranial vascular stenosis or dissection.  Thyroid goiter much larger on the right.  Cervical spondylosis.   Original Report Authenticated By: Davonna Belling, M.D.   Ct Angio Chest Pe W/cm &/or Wo Cm  09/09/2012   *RADIOLOGY REPORT*  Clinical Data: Shortness of breath and chest pain  CT ANGIOGRAPHY CHEST  Technique:  Multidetector CT imaging of the chest using the standard protocol during bolus administration of intravenous contrast. Multiplanar reconstructed images including MIPs were obtained and reviewed to evaluate the vascular anatomy.  Contrast: 50mL OMNIPAQUE IOHEXOL 350 MG/ML SOLN  Comparison: 08/20/2012  Findings: Lungs/pleura: There is pleural thickening identified within the lung bases bilaterally.  Overlying subsegmental atelectasis is noted.  No airspace consolidation noted.  No interstitial edema.  Heart/Mediastinum: Moderate cardiac enlargement.  Previous mitral valve repair. There is a right paratracheal lymph node which measures 1.2 cm, image 64/series 6.  Subcarinal lymph node measures 0.8 cm, image 81/series 6.  The main pulmonary artery appears patent.  There is no segmental or lumbar filling defect to sit chest a clinically significant pulmonary embolus.  Upper abdomen: Limited imaging through the upper abdomen shows no acute findings.  Bones/Musculoskeletal:  There is a multinodular thyroid gland with substernal extension.  There is mass effect upon the trachea with leftward deviation. No aggressive lytic or sclerotic bone lesions are identified. There  is mild spondylosis identified within the thoracic spine.  IMPRESSION:  1.  No evidence for acute pulmonary embolus. 2.  Bibasilar atelectasis and basilar pleural thickening. 3.  Large thyroid goiter with substernal extension and mass effect upon the trachea.   Original Report Authenticated By: Signa Kell, M.D.     No diagnosis found.    MDM  Patient presents with posterior neck pain is 4 AM as well as shaking her legs yesterday that has resolved. Palpitations yesterday as well. Denies chest pain shortness of breath. Denies difficulty breathing. Has some anterior neck pain on thyroid gland. No focal neuro deficits.   Record review shows patient admitted last month for multiple symptoms including hyperthyroidism and amiodarone was stopped. Xarelto was stopped during last admission. She completely a tapering dose of steroids and has been on methimazole and lopressor.  Case extensively discussed with Dr. Waynard Edwards who is on call for Dr. [name].her symptoms appear to be consistent with hyperthyroidism. She is been treated already with methimazole and metoprolol. Patient's workup in the ED is essentially unremarkable. Although the read of her EKG is junctional rhythm, her EKG shows sinus tachycardia with first degree block. ST changes similar to previous. Hemoglobin stable. She has a tender thyroid nodule on exam. Her lungs are clear, her heart is regular.  CT scan is negative for pulmonary embolism. Negative for vertebral dissection. Her neurological exam is nonfocal. Dr. Waynard Edwards spoke with the patient himself. The patient has called the office daily for the past 4 days. She has an appointment on Monday with Dr. [name]. Dr. Waynard Edwards requests that he be called tomorrow to get an update. He does not see a reason for admission and I agree. Patient has mild tachycardia and anxiety without  evidence of thyroid storm.  No evidence of PE or MI. No evidence of thyroid storm. Will increase methimazole to 30 mg  daily and metoprolol 25 mg twice a day. Repeat thyroid studies pending. Patient definitely appears to have an anxiety component contribute to her presentation.    Date: 09/09/2012  Rate: 114  Rhythm: sinus tachycardia  QRS Axis: left  Intervals: normal  ST/T Wave abnormalities: nonspecific ST/T changes  Conduction Disutrbances:first-degree A-V block   Narrative Interpretation:   Old EKG Reviewed: unchanged   Date: 09/09/2012  Rate: 109  Rhythm: sinus tachycardia  QRS Axis: normal  Intervals: PR prolonged  ST/T Wave abnormalities: nonspecific ST/T changes  Conduction Disutrbances:first-degree A-V block   Narrative Interpretation: P waves evident, prolonged PR, ST depressions laterally.  Old EKG Reviewed: unchanged    Glynn Octave, MD 09/09/12 2113

## 2012-09-09 NOTE — ED Notes (Signed)
Pt. Stated, I was having my legs to be shaking and the Dr. York Spaniel to take another thyroid pill for 2 days and see me in the office on Monday.  My heart rate went up also.   My neck is hurting and all the way on both sides down.

## 2012-09-25 ENCOUNTER — Encounter: Payer: Self-pay | Admitting: Thoracic Surgery (Cardiothoracic Vascular Surgery)

## 2012-09-25 ENCOUNTER — Ambulatory Visit (INDEPENDENT_AMBULATORY_CARE_PROVIDER_SITE_OTHER): Payer: Medicare Other | Admitting: Thoracic Surgery (Cardiothoracic Vascular Surgery)

## 2012-09-25 VITALS — BP 104/55 | HR 100 | Resp 20 | Ht 64.0 in | Wt 120.0 lb

## 2012-09-25 DIAGNOSIS — Z9889 Other specified postprocedural states: Secondary | ICD-10-CM

## 2012-09-25 DIAGNOSIS — Z8679 Personal history of other diseases of the circulatory system: Secondary | ICD-10-CM

## 2012-09-25 NOTE — Progress Notes (Signed)
[address]       [name] [address]             [phone]     CARDIOTHORACIC SURGERY OFFICE NOTE  Referring Provider is [name] Carrow, MD PCP is [name] Meo, MD   HPI:  Patient returns for followup and rhythm check status post minimally invasive mitral valve repair and Maze procedure on 06/28/2012. She was last seen here in this office on 08/18/2012.  At that time she was instructed to stop taking amiodarone.  The following day she was admitted to the hospital with fever, tachycardia, and anxiety. 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  She was treated with Tapazole and has been followed since then on a regular basis by Dr. Jacky [name]. She returns to the office today for routine followup and rhythm check. She is no longer having any significant pain in her chest related to her surgery although she states that her breast still feels numb.  Her breathing is good and she notes that it is considerably better than it was prior to surgery. She's not having any tachypalpitations. She is no longer having any recurrent fevers. She still remains anxious but she seems to be doing much better than she was at the time of her last visit. He states that she is now been referred to Dr. Evlyn Kanner for long-term management of her thyroid disease.    Current Outpatient Prescriptions  Medication Sig Dispense Refill  . acetaminophen (TYLENOL) 500 MG tablet Take 1,000 mg by mouth every 6 (six) hours as needed for pain. For pain      . ALPRAZolam (XANAX) 0.25 MG tablet Take 0.25 mg by mouth 3 (three) times daily as needed for sleep. For anxiety      . Docusate Sodium (DSS) 100 MG CAPS Take 200 mg by mouth 2 (two) times daily as needed (for constipation.).      [name] methimazole (TAPAZOLE) 10 MG tablet Take 3 tablets (30 mg total) by mouth daily.  60 tablet  0  . metoprolol tartrate (LOPRESSOR) 25 MG tablet Take 25 mg by mouth 1 day or 1  dose.      . pantoprazole (PROTONIX) 40 MG tablet Take 40 mg by mouth daily.      . traMADol (ULTRAM) 50 MG tablet Take 1-2 tablets (50-100 mg total) by mouth every 4 (four) hours as needed.  30 tablet     No current facility-administered medications for this visit.      Physical Exam:   BP 104/55  Pulse 100  Resp 20  Ht 5\' 4"  (1.626 m)  Wt 120 lb (54.432 kg)  BMI 20.59 kg/m2  SpO2 %  General:  Anxious but well-appearing  Chest:   Clear to auscultation  CV:   Regular rate and rhythm without murmur  Incisions:  Completely healed  Abdomen:  Soft nontender  Extremities:  Warm and well-perfused with no lower extremity edema  Diagnostic Tests:  2 channel telemetry rhythm strip demonstrates sinus rhythm    Transthoracic Echocardiography  Patient: [name] [name], [name] [name] MR #: [id number] Study Date: 08/22/2012 Gender: F Age: 72 Height: 162.6cm Weight: 55.5kg BSA: 1.66m^2 Pt. Status: Room: 4704  PERFORMING [name] [name], [facility] [name] [name], [name] [name] REFERRING [name] [name] [name] [name], [name] [name] cc:  ------------------------------------------------------------ LV EF: 55%  ------------------------------------------------------------ History: PMH: Mitral Valve Repair(06-28-12), Maze Opertaion, Acute thoracic back pain, dyspnea, chf, fever, pleural effusion Rule Out Endocarditis Risk factors: Dyslipidemia.  ------------------------------------------------------------  Study Conclusions  - Left ventricle: Hypokinesis at the base of the inferior wall. The cavity size was normal. Wall thickness was increased in a pattern of mild LVH. The estimated ejection fraction was 55%. - Mitral valve: The mitral valve repair is intact. There is no MR or MS. Valve area by pressure half-time: 2.47cm^2. - Left atrium: The atrium was mildly dilated. Transthoracic echocardiography. M-mode, complete 2D, spectral Doppler, and color Doppler. Height: Height: 162.6cm. Height: 64in.  Weight: Weight: 55.5kg. Weight: 122lb. Body mass index: BMI: 21kg/m^2. Body surface area: BSA: 1.23m^2. Blood pressure: 107/51. Patient status: Inpatient. Location: Echo laboratory.  ------------------------------------------------------------  ------------------------------------------------------------ Left ventricle: Hypokinesis at the base of the inferior wall. The cavity size was normal. Wall thickness was increased in a pattern of mild LVH. The estimated ejection fraction was 55%.  ------------------------------------------------------------ Aortic valve: Structurally normal valve. Cusp separation was normal. Doppler: Transvalvular velocity was within the normal range. There was no stenosis. No regurgitation.  ------------------------------------------------------------ Aorta: Aortic root: The aortic root was normal in size.  ------------------------------------------------------------ Mitral valve: The mitral valve repair is intact. There is no MR or MS. Doppler: Valve area by pressure half-time: 2.47cm^2. Indexed valve area by pressure half-time: 1.56cm^2/m^2. Mean gradient: 7mm Hg (D). Peak gradient: 13mm Hg (D).  ------------------------------------------------------------ Left atrium: The atrium was mildly dilated.  ------------------------------------------------------------ Right ventricle: The cavity size was normal. Systolic function was normal.  ------------------------------------------------------------ Pulmonic valve: The valve appears to be grossly normal. Doppler: No significant regurgitation.  ------------------------------------------------------------ Tricuspid valve: Structurally normal valve. Leaflet separation was normal. Doppler: Transvalvular velocity was within the normal range. No regurgitation.  ------------------------------------------------------------ Right atrium: Poorly  visualized.  ------------------------------------------------------------ Pericardium: There was no pericardial effusion.  ------------------------------------------------------------ Systemic veins: Inferior vena cava: The vessel was dilated; the respirophasic diameter changes were blunted (< 50%); findings are consistent with elevated central venous pressure.  ------------------------------------------------------------  2D measurements Normal Doppler measurements Normal Left ventricle Mitral valve LVID ED, 42.7 mm 43-52 Peak E vel 179 cm/s ------ chord, Mean vel, 126 cm/s ------ PLAX D LVID ES, 33 mm 23-38 Decelerati 193 ms 150-23 chord, on time 0 PLAX Pressure 89 ms ------ FS, chord, 23 % >29 half-time PLAX Mean 7 mm Hg ------ LVPW, ED 13.6 mm ------ gradient, IVS/LVPW 0.63 <1.3 D ratio, ED Peak 13 mm Hg ------ Ventricular septum gradient, IVS, ED 8.61 mm ------ D Aorta Area (PHT) 2.47 cm^2 ------ Root diam, 29 mm ------ Area index 1.56 cm^2/m ------ ED (PHT) ^2 Left atrium Annulus 37 cm ------ AP dim 41 mm ------ VTI AP dim 2.59 cm/m^2 <2.2 index  ------------------------------------------------------------ Prepared and Electronically Authenticated by  Willa Rough [id number]   Impression:  The patient is doing very well from a cardiac standpoint 3 months following minimally invasive mitral valve repair and Maze procedure. She is maintaining sinus rhythm. She apparently developed thyrotoxicosis while on amiodarone therapy, but clinically she is now improving since amiodarone was stopped and the patient was started on Tapazole therapy. Late followup echocardiogram performed 08/22/2012 looks good with intact mitral valve repair and normal left ventricular function.    Plan:  The patient will return in 3 months for routine followup and rhythm check. We will defer decisions regarding whether or not to adjust her beta blocker therapy to Dr. Clifton James,  Dr. Evlyn Kanner and/or Dr. Jacky [name].     Salvatore Decent. Cornelius Moras, MD 09/25/2012 2:08 PM

## 2012-10-01 ENCOUNTER — Emergency Department (HOSPITAL_COMMUNITY)
Admission: EM | Admit: 2012-10-01 | Discharge: 2012-10-01 | Disposition: A | Discharge status: Home / Self Care | Payer: Medicare Other | Attending: Emergency Medicine | Admitting: Emergency Medicine

## 2012-10-01 ENCOUNTER — Encounter (HOSPITAL_COMMUNITY): Payer: Self-pay

## 2012-10-01 DIAGNOSIS — Z8679 Personal history of other diseases of the circulatory system: Secondary | ICD-10-CM | POA: Insufficient documentation

## 2012-10-01 DIAGNOSIS — Z79899 Other long term (current) drug therapy: Secondary | ICD-10-CM | POA: Insufficient documentation

## 2012-10-01 DIAGNOSIS — R51 Headache: Secondary | ICD-10-CM | POA: Insufficient documentation

## 2012-10-01 DIAGNOSIS — I5020 Unspecified systolic (congestive) heart failure: Secondary | ICD-10-CM | POA: Insufficient documentation

## 2012-10-01 DIAGNOSIS — R000 Tachycardia, unspecified: Secondary | ICD-10-CM | POA: Insufficient documentation

## 2012-10-01 DIAGNOSIS — N189 Chronic kidney disease, unspecified: Secondary | ICD-10-CM | POA: Insufficient documentation

## 2012-10-01 DIAGNOSIS — M542 Cervicalgia: Secondary | ICD-10-CM | POA: Insufficient documentation

## 2012-10-01 DIAGNOSIS — Z8701 Personal history of pneumonia (recurrent): Secondary | ICD-10-CM | POA: Insufficient documentation

## 2012-10-01 DIAGNOSIS — K219 Gastro-esophageal reflux disease without esophagitis: Secondary | ICD-10-CM | POA: Insufficient documentation

## 2012-10-01 DIAGNOSIS — Z888 Allergy status to other drugs, medicaments and biological substances status: Secondary | ICD-10-CM | POA: Insufficient documentation

## 2012-10-01 DIAGNOSIS — I129 Hypertensive chronic kidney disease with stage 1 through stage 4 chronic kidney disease, or unspecified chronic kidney disease: Secondary | ICD-10-CM | POA: Insufficient documentation

## 2012-10-01 DIAGNOSIS — E0590 Thyrotoxicosis, unspecified without thyrotoxic crisis or storm: Secondary | ICD-10-CM | POA: Insufficient documentation

## 2012-10-01 DIAGNOSIS — F411 Generalized anxiety disorder: Secondary | ICD-10-CM | POA: Insufficient documentation

## 2012-10-01 LAB — BASIC METABOLIC PANEL
CO2: 26 mEq/L (ref 19–32)
Calcium: 9.9 mg/dL (ref 8.4–10.5)
Creatinine, Ser: 0.68 mg/dL (ref 0.50–1.10)
GFR calc non Af Amer: 87 mL/min — ABNORMAL LOW (ref >90)
Glucose, Bld: 106 mg/dL — ABNORMAL HIGH (ref 70–99)

## 2012-10-01 LAB — CBC WITH DIFFERENTIAL/PLATELET
Basophils Relative: 0 % (ref 0–1)
Eosinophils Absolute: 0 10*3/uL (ref 0.0–0.7)
Eosinophils Relative: 0 % (ref 0–5)
HCT: 32 % — ABNORMAL LOW (ref 36.0–46.0)
Hemoglobin: 10.5 g/dL — ABNORMAL LOW (ref 12.0–15.0)
MCH: 27.6 pg (ref 26.0–34.0)
MCHC: 32.8 g/dL (ref 30.0–36.0)
Monocytes Absolute: 1.4 10*3/uL — ABNORMAL HIGH (ref 0.1–1.0)
Monocytes Relative: 9 % (ref 3–12)

## 2012-10-01 MED ORDER — HYDROCODONE-ACETAMINOPHEN 5-325 MG PO TABS: 1.0000 | ORAL_TABLET | Freq: Four times a day (QID) | ORAL | Status: DC | PRN

## 2012-10-01 MED ORDER — SODIUM CHLORIDE 0.9 % IV BOLUS (SEPSIS)
500.0000 mL | Freq: Once | INTRAVENOUS | Status: AC
  Administered 2012-10-01: 500 mL via INTRAVENOUS

## 2012-10-01 MED ORDER — HYDROCODONE-ACETAMINOPHEN 5-325 MG PO TABS
1.0000 | ORAL_TABLET | Freq: Once | ORAL | Status: AC
  Administered 2012-10-01: 1 via ORAL
  Filled 2012-10-01: qty 1

## 2012-10-01 NOTE — ED Notes (Signed)
"  I think my thyroid pill and my thyroid don't like each other." Pt c/o ongoing head and neck pain.

## 2012-10-01 NOTE — ED Provider Notes (Signed)
History    CSN: [id number] Arrival date & time 10/01/12  1559 First MD Initiated Contact with Patient 10/01/12 [phone]      Chief Complaint  Patient presents with  . Headache  . Neck Pain    HPI The patient presents to the emergency room with complaints of neck pain and stiffness that started yesterday. Patient does have a history of atrial fibrillation as well as a thyroid goiter, anxiety, and history of cardiac valve replacement surgery. So noticed some trouble with a sore throat. Patient does have history of this happening to her about a month ago. She was told it was related to her thyroid.  The patient had been on medications to control her rate. Nursing some soreness and stiffness in her neck yesterday. She took Tylenol without much relief. This morning she tried taking her Ultram but the symptoms persisted. She called her doctor and was told to come in and get evaluated and does feel like her heart is racing. She denies any chest pain, or shortness of breath. She denies any numbness or weakness. She is anxious about the symptoms.  The neck pain increased when she massages the back of her neck and turns her head.  No fevers.  No recent injury.  Past Medical History  Diagnosis Date  . Hypertension     Then hypotension 04/2012 limiting med adjustment.  . Diverticulitis   . Anxiety   . Mitral valve prolapse     a. H/o MVP, with severe MR 04/2012, awaiting surgery.  . Hyperlipidemia     recently- taken off Crestor- due to pain in her legs, but this was after cardiac cath. , pt. questioning whether the pain in her legs was related to cath. or crestor  . Goiter   . GERD (gastroesophageal reflux disease)   . Aortic stenosis 05/19/2012    trivial by TEE  . PONV (postoperative nausea and vomiting)   . Atrial fibrillation     a. Dx 04/2012.  . Mitral regurgitation     a. Severe by TEE, pending surgery.  . Systolic CHF     a. EF 40% by studies 04/2012 (LV dysf felt 2/2 MR)  . Refusal of  blood transfusions as patient is Jehovah's Witness   . Shortness of breath   . Pneumonia     treated /w antibiotic- [facility]- early Feb. 2014  . Chronic kidney disease     stricture- of urethra - treated /w Dr. Elige Radon  . Headache(784.0)     h/o migraines   . S/P Maze operation for atrial fibrillation 06/28/2012    Complete bilateral atrial lesion set using cryothermy ablation with oversewing of LA appendage via right minithoracotomy  . S/P mitral valve repair 06/28/2012    Complex valvuloplasty including quadrangular resection of posterior leaflet, sliding leaflet plasty, artificial Gore-tex neocord placement x2 and 32mm Sorin Memo 3D ring annuloplasty via right mini thoracotomy     Past Surgical History  Procedure Laterality Date  . Uterine fibroid surgery  1988  . Ovarian cyst surgery  1988  . Tubal ligation  1974  . Tee without cardioversion  05/19/2012    Procedure: TRANSESOPHAGEAL ECHOCARDIOGRAM (TEE);  Surgeon: Dolores Patty, MD;  Location: [facility] ENDOSCOPY;  Service: Cardiovascular;  Laterality: N/A;  . Mitral valve repair Right 06/28/2012    Procedure: MINIMALLY INVASIVE MITRAL VALVE REPAIR (MVR);  Surgeon: Purcell Nails, MD;  Location: [facility] OR;  Service: Open Heart Surgery;  Laterality: Right;  . Minimally invasive maze procedure  N/A 06/28/2012    Procedure: MINIMALLY INVASIVE MAZE PROCEDURE;  Surgeon: Purcell Nails, MD;  Location: [facility] OR;  Service: Open Heart Surgery;  Laterality: N/A;  . Intraoperative transesophageal echocardiogram N/A 06/28/2012    Procedure: INTRAOPERATIVE TRANSESOPHAGEAL ECHOCARDIOGRAM;  Surgeon: Purcell Nails, MD;  Location: [facility] OR;  Service: Open Heart Surgery;  Laterality: N/A;    Family History  Problem Relation Age of Onset  . Colon cancer Father     History  Substance Use Topics  . Smoking status: Never Smoker   . Smokeless tobacco: Never Used  . Alcohol Use: No     Comment: 1 drink a month    OB History   Grav Para Term Preterm Abortions TAB SAB  Ect Mult Living                  Review of Systems  All other systems reviewed and are negative.    Allergies  Sulfa drugs cross reactors; Aspirin; and Naproxen sodium  Home Medications   Current Outpatient Rx  Name  Route  Sig  Dispense  Refill  . acetaminophen (TYLENOL) 500 MG tablet   Oral   Take 1,000 mg by mouth every 6 (six) hours as needed for pain.          [name] ALPRAZolam (XANAX) 0.25 MG tablet   Oral   Take 0.25 mg by mouth 3 (three) times daily as needed for anxiety. For anxiety         . docusate sodium (COLACE) 100 MG capsule   Oral   Take 200 mg by mouth 2 (two) times daily.         . methimazole (TAPAZOLE) 10 MG tablet   Oral   Take 3 tablets (30 mg total) by mouth daily.   60 tablet   0   . metoprolol tartrate (LOPRESSOR) 25 MG tablet   Oral   Take 25 mg by mouth 2 (two) times daily.          . pantoprazole (PROTONIX) 40 MG tablet   Oral   Take 40 mg by mouth daily.         . traMADol (ULTRAM) 50 MG tablet   Oral   Take 50-100 mg by mouth every 4 (four) hours as needed for pain.         [name] HYDROcodone-acetaminophen (NORCO) 5-325 MG per tablet   Oral   Take 1 tablet by mouth every 6 (six) hours as needed for pain.   20 tablet   0     BP 106/50  Pulse 104  Resp 18  SpO2 99%  Physical Exam  Nursing note and vitals reviewed. Constitutional: She appears well-developed and well-nourished. No distress.  HENT:  Head: Normocephalic and atraumatic.  Right Ear: External ear normal.  Left Ear: External ear normal.  Eyes: Conjunctivae are normal. Right eye exhibits no discharge. Left eye exhibits no discharge. No scleral icterus.  Neck: Neck supple. No tracheal deviation present. Thyromegaly present.  Paraspinal muscle tenderness, pain with range of motion, no meningeal signs  Cardiovascular: Regular rhythm and intact distal pulses.  Tachycardia present.   Pulmonary/Chest: Effort normal and breath sounds normal. No stridor. No  respiratory distress. She has no wheezes. She has no rales.  Abdominal: Soft. Bowel sounds are normal. She exhibits no distension. There is no tenderness. There is no rebound and no guarding.  Musculoskeletal: She exhibits no edema and no tenderness.  Neurological: She is alert. She has normal strength. No  sensory deficit. Cranial nerve deficit:  no gross defecits noted. She exhibits normal muscle tone. She displays no seizure activity. Coordination normal.  Skin: Skin is warm and dry. No rash noted.  Psychiatric: She has a normal mood and affect.    ED Course  Procedures (including critical care time) EKG Sinus tachycardia rate 113 First-degree A-V block Normal intervals, normal axis Borderline repolarization abnormality No prior EKG Labs Reviewed  BASIC METABOLIC PANEL - Abnormal; Notable for the following:    Sodium 134 (*)    Glucose, Bld 106 (*)    GFR calc non Af Amer 87 (*)    All other components within normal limits  CBC WITH DIFFERENTIAL - Abnormal; Notable for the following:    WBC 15.4 (*)    RBC 3.80 (*)    Hemoglobin 10.5 (*)    HCT 32.0 (*)    RDW 16.0 (*)    Neutrophils Relative % 82 (*)    Neutro Abs 12.6 (*)    Lymphocytes Relative 9 (*)    Monocytes Absolute 1.4 (*)    All other components within normal limits  CBC WITH DIFFERENTIAL  POCT I-STAT TROPONIN I   No results found.   1. Neck pain       MDM  Neck pain Think the patient's symptoms are musculoskeletal in nature. She was given hydrocodone and states she feels much better. I doubt referred cardiac etiology. She does have some mild thyromegaly and does have some slight discomfort to palpation. It may be a contributing factor as well  Tachycardia The patient is a sinus rhythm. Reviewing her old records she has had similar issues with tachycardia. This appears to be her baseline.  Doubt acute infection, acute cardiac dysrhythmia or ischemia, pulmonary embolism or any other emergent medical  condition at this time  Patient is comfortable discharge. I will give her prescription for hydrocodone to take for her neck pain        Celene Kras, MD 10/01/12 [phone]

## 2012-10-24 ENCOUNTER — Telehealth: Payer: Self-pay | Admitting: *Deleted

## 2012-10-24 ENCOUNTER — Telehealth: Payer: Self-pay | Admitting: Cardiovascular Disease

## 2012-10-24 ENCOUNTER — Encounter: Payer: Self-pay | Admitting: *Deleted

## 2012-10-24 NOTE — Telephone Encounter (Signed)
Spoke with pt. She reports she had episode of heart racing about 2 weeks ago and EMS saw pt and it was determined she was having an anxiety attack. She did not go to hospital. She reports she had episode yesterday about 1:00 of heart racing. This lasted off and on throughout day. She took Xanax which helped some.  During the night she did not rest well and was up and down to the bathroom several times.  Today around 1:00 she had another episode of heart racing.  Lasted about half hour. Improved after taking Xanax.  At this time she reports most recent heart rates have been 73 and 91.  I have made pt an appt to see Dr. Clifton James tomorrow at 11:30. I told pt if she continues to have episodes of heart racing prior to this appt she should be evaluated in ED.  Pt reports she has seen Dr. [name] recently and spoken with office several times.  I have contacted his office to have most recent note faxed over.

## 2012-10-24 NOTE — Telephone Encounter (Signed)
New Problem  Pt said she had to call EMS 2 wks ago because she said her heart was racing and she thought she was having a heart attack. She was advised by EMS that it was an anxiety attack. She said it occurred again this morning and she talked with her PCP and they told her that it had nothing to do with her medication that she is on.  She said she is very tired today and wants to go to sleep but she is scared that it will happen again. She said that she hasn't been under any stress or anything that will cause her to have an anxiety attack. She asked if you could call her back when you get a chance.

## 2012-10-24 NOTE — Telephone Encounter (Signed)
[name] [name] called with concerns of having a fast heart rate recently that doesn't feell regular.  She took half a Xanax and it seemed to help.  She says her PCP doesn't like her taking too many Xanax. I told her that she needs to call and get an appointment with her cardiologist.  If this happens again and she gets dizzy, feels faint she should go to the ER.  She agrees.

## 2012-10-25 ENCOUNTER — Encounter: Payer: Self-pay | Admitting: Cardiovascular Disease

## 2012-10-25 ENCOUNTER — Ambulatory Visit (INDEPENDENT_AMBULATORY_CARE_PROVIDER_SITE_OTHER): Payer: Medicare Other | Admitting: Cardiovascular Disease

## 2012-10-25 ENCOUNTER — Encounter (INDEPENDENT_AMBULATORY_CARE_PROVIDER_SITE_OTHER): Payer: Medicare Other

## 2012-10-25 ENCOUNTER — Encounter: Payer: Self-pay | Admitting: *Deleted

## 2012-10-25 VITALS — BP 140/80 | HR 100 | Ht 66.5 in | Wt 112.0 lb

## 2012-10-25 DIAGNOSIS — I4949 Other premature depolarization: Secondary | ICD-10-CM

## 2012-10-25 DIAGNOSIS — R002 Palpitations: Secondary | ICD-10-CM

## 2012-10-25 DIAGNOSIS — I493 Ventricular premature depolarization: Secondary | ICD-10-CM

## 2012-10-25 DIAGNOSIS — I059 Rheumatic mitral valve disease, unspecified: Secondary | ICD-10-CM

## 2012-10-25 DIAGNOSIS — I340 Nonrheumatic mitral (valve) insufficiency: Secondary | ICD-10-CM

## 2012-10-25 MED ORDER — DILTIAZEM HCL ER COATED BEADS 120 MG PO CP24: 120.0000 mg | ORAL_CAPSULE | Freq: Every day | ORAL | Status: DC

## 2012-10-25 NOTE — Progress Notes (Signed)
Patient ID: [name] [name], female   DOB: [birth year], 72 y.o.   MRN: [id number] EVO 48 Hour Holter Monitor applied to patient.

## 2012-10-25 NOTE — Patient Instructions (Addendum)
Your physician recommends that you schedule a follow-up appointment in:  About 2 weeks---Scheduled for November 06, 2012 at 9:30  Your physician has recommended that you wear a holter monitor. Holter monitors are medical devices that record the heart's electrical activity. Doctors most often use these monitors to diagnose arrhythmias. Arrhythmias are problems with the speed or rhythm of the heartbeat. The monitor is a small, portable device. You can wear one while you do your normal daily activities. This is usually used to diagnose what is causing palpitations/syncope (passing out).    Your physician has recommended you make the following change in your medication:  Stop metoprolol tartrate. Start Cardizem CD 120 mg by mouth daily

## 2012-10-25 NOTE — Telephone Encounter (Signed)
Agree. cdm 

## 2012-10-25 NOTE — Progress Notes (Addendum)
History of Present Illness: 72 y.o. female with history of chronic systolic CHF, severe MR, aortic stenosis, HTN, HLD, GERD, atrial fibrillation who is here today for follow up. She was admitted to [facility] 1/20-1/29/14 with atrial fibrillation with RVR, acute CHF. Echo 05/16/12: EF 40-45%, diffuse HK, severe prolapse of the mitral valve involving the anterior and posterior leaflets, severe MR, moderate LAE, mild RAE, small pericardial effusion. TEE 05/19/12: EF 45%, moderately calcified aorta, severe prolapse of the mitral valve involving the P2 scallop of the posterior leaflet, severe MR, severe LAE, mild RAE, no PFO. Patient was diuresed with IV Lasix due to systolic CHF. She was seen by [facility]. Patient was not cardioverted due to need for upcoming surgery as well as the size of her left atrium. Right and left heart cath 05/22/12: No CAD, EF 40%, PCWP 15, Fick CO 4.1. She was placed on amiodarone for rate control. She was placed on low-dose Xarelto 15 mg daily for anticoagulation. She has since undergone minimally invasive mitral valve repair and Maze procedure by Dr. Cornelius Moras on 06/28/2012 . Postoperatively, she remained in sinus rhythm on Amiodarone and Xarelto. She had postoperative blood loss anemia, but was treated conservatively with no transfusion in light of her being a Jehovah's Witness. She was discharged to [facility] for continued rehab on 07/06/2012. Her amiodarone was stopped on 08/18/12 at an outpatient appt with Dr. Cornelius Moras. She presented to the ER on 08/20/12 feeling poorly with complaints of back and abdominal pain. She has had increased SOB, epigastric abdominal pain and chills with a temp of 102.6. In the ER pro BNP was elevated. She was treated with Lasix IV and antibiotics but in the ER she had a drop in her oxygen saturation and BP. Abdominal CT and ultrasound were normal. She was found to have thyrotoxicosis. She was still anemic so Xarelto was stopped in the hospital. She was in sinus rhythm so  amiodarone was not restarted. Her metoprolol was restarted at 12.5 mg po BID before discharge. Her thyroid disorder has been followed closely in primary care.   She is added onto my schedule today with c/o palpitations. She feels her heart racing every day. She has some dizziness and visual changes. She is using Xanax daily. She intiially tells me she is taking metoprolol only at night then when I ask her to increase to twice daily, she tells me she was lying and has not been taking.   Primary Care Physician: Geoffry Paradise  Past Medical History  Diagnosis Date  . Hypertension     Then hypotension 04/2012 limiting med adjustment.  . Diverticulitis   . Anxiety   . Mitral valve prolapse     a. H/o MVP, with severe MR 04/2012, awaiting surgery.  . Hyperlipidemia     recently- taken off Crestor- due to pain in her legs, but this was after cardiac cath. , pt. questioning whether the pain in her legs was related to cath. or crestor  . Goiter   . GERD (gastroesophageal reflux disease)   . Aortic stenosis 05/19/2012    trivial by TEE  . PONV (postoperative nausea and vomiting)   . Atrial fibrillation     a. Dx 04/2012.  . Mitral regurgitation     a. Severe by TEE, pending surgery.  . Systolic CHF     a. EF 40% by studies 04/2012 (LV dysf felt 2/2 MR)  . Refusal of blood transfusions as patient is Jehovah's Witness   . Shortness of  breath   . Pneumonia     treated /w antibiotic- [facility]- early Feb. 2014  . Chronic kidney disease     stricture- of urethra - treated /w Dr. Elige Radon  . Headache(784.0)     h/o migraines   . S/P Maze operation for atrial fibrillation 06/28/2012    Complete bilateral atrial lesion set using cryothermy ablation with oversewing of LA appendage via right minithoracotomy  . S/P mitral valve repair 06/28/2012    Complex valvuloplasty including quadrangular resection of posterior leaflet, sliding leaflet plasty, artificial Gore-tex neocord placement x2 and 32mm Sorin Memo 3D  ring annuloplasty via right mini thoracotomy     Past Surgical History  Procedure Laterality Date  . Uterine fibroid surgery  1988  . Ovarian cyst surgery  1988  . Tubal ligation  1974  . Tee without cardioversion  05/19/2012    Procedure: TRANSESOPHAGEAL ECHOCARDIOGRAM (TEE);  Surgeon: Dolores Patty, MD;  Location: [facility] ENDOSCOPY;  Service: Cardiovascular;  Laterality: N/A;  . Mitral valve repair Right 06/28/2012    Procedure: MINIMALLY INVASIVE MITRAL VALVE REPAIR (MVR);  Surgeon: Purcell Nails, MD;  Location: [facility] OR;  Service: Open Heart Surgery;  Laterality: Right;  . Minimally invasive maze procedure N/A 06/28/2012    Procedure: MINIMALLY INVASIVE MAZE PROCEDURE;  Surgeon: Purcell Nails, MD;  Location: [facility] OR;  Service: Open Heart Surgery;  Laterality: N/A;  . Intraoperative transesophageal echocardiogram N/A 06/28/2012    Procedure: INTRAOPERATIVE TRANSESOPHAGEAL ECHOCARDIOGRAM;  Surgeon: Purcell Nails, MD;  Location: [facility] OR;  Service: Open Heart Surgery;  Laterality: N/A;    Current Outpatient Prescriptions  Medication Sig Dispense Refill  . acetaminophen (TYLENOL) 500 MG tablet Take 1,000 mg by mouth every 6 (six) hours as needed for pain.       [name] ALPRAZolam (XANAX) 0.25 MG tablet Take 0.25 mg by mouth 3 (three) times daily as needed for anxiety. For anxiety      . docusate sodium (COLACE) 100 MG capsule Take 200 mg by mouth 2 (two) times daily.      . methimazole (TAPAZOLE) 10 MG tablet Take 3 tablets (30 mg total) by mouth daily.  60 tablet  0  . metoprolol tartrate (LOPRESSOR) 25 MG tablet Take 25 mg by mouth 2 (two) times daily.       . pantoprazole (PROTONIX) 40 MG tablet Take 40 mg by mouth daily.      . traMADol (ULTRAM) 50 MG tablet Take 50-100 mg by mouth every 4 (four) hours as needed for pain.       No current facility-administered medications for this visit.    Allergies  Allergen Reactions  . Sulfa Drugs Cross Reactors Other (See Comments)    Unknown.  . Aspirin  Rash  . Naproxen Sodium Swelling and Rash    History   Social History  . Marital Status: Divorced    Spouse Name: N/A    Number of Children: N/A  . Years of Education: N/A   Occupational History  . Not on file.   Social History Main Topics  . Smoking status: Never Smoker   . Smokeless tobacco: Never Used  . Alcohol Use: No     Comment: 1 drink a month  . Drug Use: No  . Sexually Active: Not on file   Other Topics Concern  . Not on file   Social History Narrative   Divorced - lives alone - remains functionally independent    Family History  Problem Relation Age of  Onset  . Colon cancer Father     Review of Systems:  As stated in the HPI and otherwise negative.   BP 140/80  Pulse 100  Ht 5' 6.5" (1.689 m)  Wt 112 lb (50.803 kg)  BMI 17.81 kg/m2  Physical Examination: General: Well developed, well nourished, NAD HEENT: OP clear, mucus membranes moist SKIN: warm, dry. No rashes. Neuro: No focal deficits Musculoskeletal: Muscle strength 5/5 all ext Psychiatric: Mood and affect normal Neck: No JVD, no carotid bruits, no thyromegaly, no lymphadenopathy. Lungs:Clear bilaterally, no wheezes, rhonci, crackles Cardiovascular: Regular rate and rhythm. No murmurs, gallops or rubs. Abdomen:Soft. Bowel sounds present. Non-tender.  Extremities: No lower extremity edema. Pulses are 2 + in the bilateral DP/PT.  EKG: Sinus tachycardia with rate 114 bpm, PVCs. Non-specific ST and T wave abnormalities.   Assessment and Plan:   1. Mitral valve regurgitation: s/p repair. Examination normal today.   2. Atrial fibrillation: s/p MAZE. Sinus tach today. No anticoagulation at this time with recent anemia. She has not been taking metoprolol as asked. She thinks it is making her feel fatigued. Will stop metoprolol and will restart Cardizem CD 120 mg po Qdaily. Will arrange 48 hour Holter monitor. Continue xanax.    3. PVCs: 48 hour monitor to assess number of PVCs and exclude VT.

## 2012-10-26 ENCOUNTER — Telehealth: Payer: Self-pay | Admitting: Cardiovascular Disease

## 2012-10-26 NOTE — Telephone Encounter (Signed)
Returned call to patient she stated she has not started cardizem yet.Stated she was reading side effects and is afraid to take.Advised to start taking cardizem cd 120 mg daily as directed by Dr.McAlhany.Advised if she has any problems to call back.

## 2012-10-26 NOTE — Telephone Encounter (Signed)
New Prob     Pt has a questions about her BP pill. Please call.

## 2012-10-28 ENCOUNTER — Emergency Department (HOSPITAL_COMMUNITY): Payer: Medicare Other

## 2012-10-28 ENCOUNTER — Encounter (HOSPITAL_COMMUNITY): Payer: Self-pay | Admitting: Emergency Medicine

## 2012-10-28 ENCOUNTER — Observation Stay (HOSPITAL_COMMUNITY)
Admission: EM | Admit: 2012-10-28 | Discharge: 2012-10-30 | Disposition: A | Discharge status: Home / Self Care | Payer: Medicare Other | Attending: Cardiovascular Disease | Admitting: Cardiovascular Disease

## 2012-10-28 ENCOUNTER — Telehealth: Payer: Self-pay | Admitting: Physician Assistant

## 2012-10-28 DIAGNOSIS — R0600 Dyspnea, unspecified: Secondary | ICD-10-CM | POA: Diagnosis present

## 2012-10-28 DIAGNOSIS — I509 Heart failure, unspecified: Principal | ICD-10-CM | POA: Diagnosis present

## 2012-10-28 DIAGNOSIS — F411 Generalized anxiety disorder: Secondary | ICD-10-CM | POA: Insufficient documentation

## 2012-10-28 DIAGNOSIS — Z602 Problems related to living alone: Secondary | ICD-10-CM | POA: Insufficient documentation

## 2012-10-28 DIAGNOSIS — G453 Amaurosis fugax: Secondary | ICD-10-CM

## 2012-10-28 DIAGNOSIS — Z7901 Long term (current) use of anticoagulants: Secondary | ICD-10-CM | POA: Insufficient documentation

## 2012-10-28 DIAGNOSIS — E785 Hyperlipidemia, unspecified: Secondary | ICD-10-CM | POA: Diagnosis present

## 2012-10-28 DIAGNOSIS — I4891 Unspecified atrial fibrillation: Secondary | ICD-10-CM | POA: Diagnosis present

## 2012-10-28 DIAGNOSIS — E0590 Thyrotoxicosis, unspecified without thyrotoxic crisis or storm: Secondary | ICD-10-CM | POA: Diagnosis present

## 2012-10-28 DIAGNOSIS — I10 Essential (primary) hypertension: Secondary | ICD-10-CM | POA: Diagnosis present

## 2012-10-28 DIAGNOSIS — F419 Anxiety disorder, unspecified: Secondary | ICD-10-CM | POA: Diagnosis present

## 2012-10-28 DIAGNOSIS — Z79899 Other long term (current) drug therapy: Secondary | ICD-10-CM | POA: Insufficient documentation

## 2012-10-28 DIAGNOSIS — R0602 Shortness of breath: Secondary | ICD-10-CM | POA: Insufficient documentation

## 2012-10-28 DIAGNOSIS — E0500 Thyrotoxicosis with diffuse goiter without thyrotoxic crisis or storm: Secondary | ICD-10-CM | POA: Insufficient documentation

## 2012-10-28 DIAGNOSIS — T462X5A Adverse effect of other antidysrhythmic drugs, initial encounter: Secondary | ICD-10-CM | POA: Insufficient documentation

## 2012-10-28 DIAGNOSIS — Z9889 Other specified postprocedural states: Secondary | ICD-10-CM

## 2012-10-28 DIAGNOSIS — I4892 Unspecified atrial flutter: Secondary | ICD-10-CM

## 2012-10-28 HISTORY — DX: Anemia, unspecified: D64.9

## 2012-10-28 HISTORY — DX: Thyrotoxicosis, unspecified without thyrotoxic crisis or storm: E05.90

## 2012-10-28 HISTORY — DX: Heart failure, unspecified: I50.9

## 2012-10-28 HISTORY — DX: Other microscopic hematuria: R31.29

## 2012-10-28 LAB — COMPREHENSIVE METABOLIC PANEL
Alkaline Phosphatase: 148 U/L — ABNORMAL HIGH (ref 39–117)
BUN: 5 mg/dL — ABNORMAL LOW (ref 6–23)
Chloride: 103 mEq/L (ref 96–112)
Creatinine, Ser: 0.75 mg/dL (ref 0.50–1.10)
GFR calc Af Amer: >90 mL/min (ref >90)
GFR calc non Af Amer: 83 mL/min — ABNORMAL LOW (ref >90)
Glucose, Bld: 100 mg/dL — ABNORMAL HIGH (ref 70–99)
Potassium: 3.9 mEq/L (ref 3.5–5.1)
Total Bilirubin: 0.4 mg/dL (ref 0.3–1.2)

## 2012-10-28 LAB — POCT I-STAT, CHEM 8
Glucose, Bld: 107 mg/dL — ABNORMAL HIGH (ref 70–99)
HCT: 36 % (ref 36.0–46.0)
Hemoglobin: 12.2 g/dL (ref 12.0–15.0)
Potassium: 3.7 mEq/L (ref 3.5–5.1)
Sodium: 140 mEq/L (ref 135–145)

## 2012-10-28 LAB — CBC WITH DIFFERENTIAL/PLATELET
Basophils Relative: 0 % (ref 0–1)
Eosinophils Absolute: 0 10*3/uL (ref 0.0–0.7)
MCH: 26.9 pg (ref 26.0–34.0)
MCHC: 32.9 g/dL (ref 30.0–36.0)
Monocytes Relative: 6 % (ref 3–12)
Neutrophils Relative %: 81 % — ABNORMAL HIGH (ref 43–77)
Platelets: 382 10*3/uL (ref 150–400)
RDW: 16.5 % — ABNORMAL HIGH (ref 11.5–15.5)

## 2012-10-28 LAB — PRO B NATRIURETIC PEPTIDE
Pro B Natriuretic peptide (BNP): 4411 pg/mL — ABNORMAL HIGH (ref 0–125)
Pro B Natriuretic peptide (BNP): 4320 pg/mL — ABNORMAL HIGH (ref 0–125)

## 2012-10-28 MED ORDER — PNEUMOCOCCAL VAC POLYVALENT 25 MCG/0.5ML IJ INJ
0.5000 mL | INJECTION | INTRAMUSCULAR | Status: AC
  Filled 2012-10-28: qty 0.5

## 2012-10-28 MED ORDER — METHIMAZOLE 10 MG PO TABS
20.0000 mg | ORAL_TABLET | Freq: Every day | ORAL | Status: DC
  Administered 2012-10-29 – 2012-10-30 (×2): 20 mg via ORAL
  Filled 2012-10-28 (×2): qty 2

## 2012-10-28 MED ORDER — DOCUSATE SODIUM 100 MG PO CAPS
200.0000 mg | ORAL_CAPSULE | Freq: Two times a day (BID) | ORAL | Status: DC
  Administered 2012-10-29 – 2012-10-30 (×2): 200 mg via ORAL
  Filled 2012-10-28 (×6): qty 2

## 2012-10-28 MED ORDER — RIVAROXABAN 20 MG PO TABS
20.0000 mg | ORAL_TABLET | Freq: Every day | ORAL | Status: DC
  Filled 2012-10-28 (×3): qty 1

## 2012-10-28 MED ORDER — TRAMADOL HCL 50 MG PO TABS
50.0000 mg | ORAL_TABLET | ORAL | Status: DC | PRN
  Administered 2012-10-28 – 2012-10-29 (×2): 50 mg via ORAL
  Administered 2012-10-30: 100 mg via ORAL
  Filled 2012-10-28 (×3): qty 2

## 2012-10-28 MED ORDER — ALPRAZOLAM 0.25 MG PO TABS
0.2500 mg | ORAL_TABLET | Freq: Three times a day (TID) | ORAL | Status: DC | PRN
  Administered 2012-10-28 – 2012-10-29 (×2): 0.25 mg via ORAL
  Filled 2012-10-28 (×2): qty 1

## 2012-10-28 MED ORDER — METOPROLOL TARTRATE 25 MG PO TABS
25.0000 mg | ORAL_TABLET | Freq: Two times a day (BID) | ORAL | Status: DC
  Administered 2012-10-28 – 2012-10-29 (×2): 25 mg via ORAL
  Filled 2012-10-28 (×6): qty 1

## 2012-10-28 MED ORDER — PANTOPRAZOLE SODIUM 40 MG PO TBEC
40.0000 mg | DELAYED_RELEASE_TABLET | Freq: Every day | ORAL | Status: DC
  Administered 2012-10-28 – 2012-10-30 (×3): 40 mg via ORAL
  Filled 2012-10-28 (×3): qty 1

## 2012-10-28 MED ORDER — ACETAMINOPHEN 325 MG PO TABS: 650.0000 mg | ORAL_TABLET | ORAL | Status: DC | PRN

## 2012-10-28 NOTE — ED Provider Notes (Signed)
History    CSN: [id number] Arrival date & time 10/28/12  1156  First MD Initiated Contact with Patient 10/28/12 1218     Chief Complaint  Patient presents with  . Shortness of Breath  . Palpitations    HPI  Pt here with sob and chesty pain that started thurs day night after taking a new b/p med , pt has surgery back in march to repair a leaking valve .  Positive for nocturnal dyspnea and two pillow orthopnea.    Past Medical History  Diagnosis Date  . Hypertension     Then hypotension 04/2012 limiting med adjustment.  . Diverticulitis   . Anxiety   . Mitral valve prolapse     a. H/o MVP, with severe MR 04/2012, had MV repair and Maze  . Hyperlipidemia     recently- taken off Crestor- due to pain in her legs, but this was after cardiac cath. , pt. questioning whether the pain in her legs was related to cath. or crestor  . Goiter   . GERD (gastroesophageal reflux disease)   . Aortic stenosis 05/19/2012    trivial by TEE  . Atrial fibrillation     a. Dx 04/2012.  [name] Refusal of blood transfusions as patient is Jehovah's Witness   . Pneumonia     treated /w antibiotic- [facility]- early Feb. 2014  . S/P Maze operation for atrial fibrillation 06/28/2012    Complete bilateral atrial lesion set using cryothermy ablation with oversewing of LA appendage via right minithoracotomy  . S/P mitral valve repair 06/28/2012    Complex valvuloplasty including quadrangular resection of posterior leaflet, sliding leaflet plasty, artificial Gore-tex neocord placement x2 and 32mm Sorin Memo 3D ring annuloplasty via right mini thoracotomy   . 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 Microscopic hematuria 08/20/2012  . Shortness of breath   . Heart murmur   . CHF (congestive heart failure)   . Anemia    Past Surgical History  Procedure Laterality Date  . Uterine fibroid surgery  1988  . Ovarian cyst surgery  1988  . Tubal ligation  1974  . Tee without cardioversion  05/19/2012    Procedure:  TRANSESOPHAGEAL ECHOCARDIOGRAM (TEE);  Surgeon: Dolores Patty, MD;  Location: [facility] ENDOSCOPY;  Service: Cardiovascular;  Laterality: N/A;  . Mitral valve repair Right 06/28/2012    Procedure: MINIMALLY INVASIVE MITRAL VALVE REPAIR (MVR);  Surgeon: Purcell Nails, MD;  Location: [facility] OR;  Service: Open Heart Surgery;  Laterality: Right;  . Minimally invasive maze procedure N/A 06/28/2012    Procedure: MINIMALLY INVASIVE MAZE PROCEDURE;  Surgeon: Purcell Nails, MD;  Location: [facility] OR;  Service: Open Heart Surgery;  Laterality: N/A;  . Intraoperative transesophageal echocardiogram N/A 06/28/2012    Procedure: INTRAOPERATIVE TRANSESOPHAGEAL ECHOCARDIOGRAM;  Surgeon: Purcell Nails, MD;  Location: [facility] OR;  Service: Open Heart Surgery;  Laterality: N/A;  . Urethral stricture dilatation     Family History  Problem Relation Age of Onset  . Colon cancer Father    History  Substance Use Topics  . Smoking status: Never Smoker   . Smokeless tobacco: Never Used  . Alcohol Use: No     Comment: 1 drink a month   OB History   Grav Para Term Preterm Abortions TAB SAB Ect Mult Living                 Review of Systems  Allergies  Amiodarone; Diltiazem; Sulfa drugs cross reactors; Aspirin; and Naproxen sodium  Home Medications   No current outpatient prescriptions on file. BP 124/53  Pulse 81  Temp(Src) 97.7 F (36.5 C) (Oral)  Resp 18  Ht 5\' 6"  (1.676 m)  Wt 107 lb 9.4 oz (48.8 kg)  BMI 17.37 kg/m2  SpO2 97% Physical Exam  Nursing note and vitals reviewed. Constitutional: She appears well-developed and well-nourished. No distress.  HENT:  Head: Normocephalic and atraumatic.  Right Ear: External ear normal.  Left Ear: External ear normal.  Eyes: Conjunctivae are normal. Right eye exhibits no discharge. Left eye exhibits no discharge. No scleral icterus.  Neck: Neck supple. No tracheal deviation present. Thyromegaly present.  Paraspinal muscle tenderness, pain with range of motion, no  meningeal signs  Cardiovascular: Regular rhythm and intact distal pulses.  Tachycardia present.   Pulmonary/Chest: Effort normal and breath sounds normal. No stridor. No respiratory distress. She has no wheezes. She has no rales.  Abdominal: Soft. Bowel sounds are normal. She exhibits no distension. There is no tenderness. There is no rebound and no guarding.  Musculoskeletal: She exhibits no edema and no tenderness.  Neurological: She is alert. She has normal strength. No sensory deficit. Cranial nerve deficit:  no gross defecits noted. She exhibits normal muscle tone. She displays no seizure activity. Coordination normal.  Skin: Skin is warm and dry. No rash noted.  Psychiatric: She has a normal mood and affect.    ED Course  Procedures (including critical care time) Labs Reviewed  CBC WITH DIFFERENTIAL - Abnormal; Notable for the following:    Hemoglobin 11.4 (*)    HCT 34.7 (*)    RDW 16.5 (*)    Neutrophils Relative % 81 (*)    All other components within normal limits  PRO B NATRIURETIC PEPTIDE - Abnormal; Notable for the following:    Pro B Natriuretic peptide (BNP) 4411.0 (*)    All other components within normal limits  COMPREHENSIVE METABOLIC PANEL - Abnormal; Notable for the following:    Glucose, Bld 100 (*)    BUN 5 (*)    Albumin 3.4 (*)    Alkaline Phosphatase 148 (*)    GFR calc non Af Amer 83 (*)    All other components within normal limits  PRO B NATRIURETIC PEPTIDE - Abnormal; Notable for the following:    Pro B Natriuretic peptide (BNP) 4320.0 (*)    All other components within normal limits  POCT I-STAT, CHEM 8 - Abnormal; Notable for the following:    BUN <3 (*)    Glucose, Bld 107 (*)    All other components within normal limits  TROPONIN I  TROPONIN I  T4, FREE  TSH  POCT I-STAT TROPONIN I   Date: 10/28/2012  Rate: 114  Rhythm: Sinus tachycardia with frequent PVCs  QRS Axis: normal  Intervals: normal  ST/T Wave abnormalities: Marked ST  abnormalities in the lateral leads which are new from previous tracing.  Conduction Disutrbances: none  Narrative Interpretation: Abnormal EKG     Dg Chest Portable 1 View  10/28/2012   *RADIOLOGY REPORT*  Clinical Data: Shortness of breath and palpitations  PORTABLE CHEST - 1 VIEW  Comparison: 09/09/2012  Findings: There is mild cardiac enlargement.  No pleural effusions or edema.  Increased lung volumes with coarsened interstitial markings noted bilaterally.  No focal airspace consolidation identified. A scoliosis deformity involves the thoracic and lumbar spine.  IMPRESSION:  1.  Chronic interstitial coarsening.  No pneumonia or evidence of heart failure.   Original Report Authenticated By: Ladona Ridgel  Bradly Chris, M.D.   1. Acute CHF     MDM  Discuss the case with cardiology who will come to emergency department for evaluation and recommendations for management  Nelia Shi, MD 10/28/12 2103

## 2012-10-28 NOTE — ED Notes (Signed)
Pt here with sob and chesty pain that started thurs day night after taking a new b/p med , pt has surgery back in march to repair a leaking valve

## 2012-10-28 NOTE — Progress Notes (Addendum)
History and Physical   Admit date: 10/28/2012 Name:  [name] [name] Medical record number: [id number] DOB/Age:  1940/06/26  72 y.o. female  Referring Physician:   [facility] Primary Cardiologist: Dr. Doylene Bode Primary Physician:  Dr. Geoffry Paradise Chief complaint/reason for admission:  Palpitations and shortness of breath.  HPI:  This 72 year old female has a long-standing history of anxiety and has a history of mitral valve prolapse. She developed shortness of breath pneumonia and was found in atrial fibrillation later found to have severe mitral regurgitation. She was evaluated at catheterization that showed no significant coronary artery disease but severe pulmonary hypertension and mitral regurgitation. She underwent mitral valve repair in March by Dr. Cornelius Moras and at the time had a Maze procedure done. She was on the [facility] following this which was later discontinued because of anemia in May when she was readmitted with fever. At that time she was found to have thyrotoxicosis it was thought to be induced by amiodarone given postoperatively and this was stopped and she was placed on Tapazole. She's had intermittent palpitations and was originally on beta blockers but when she was seen recently by the cardiologist the beta blocker was discontinued because she was only taking it once a day and she was placed on sustained-release diltiazem. She took that the past 2 nights but stated that it made her feel terrible. She was unable to sleep, became anxious and complained of shortness of breath as well as palpitations while taking it. She presented to the emergency room she was found to have new changes in the lateral leads on the EKG. In addition she complained of 2 episodes of amaurosis where she felt like shades  coming down over her eye on the right side. She had no other neurologic complaints. She was found to be tachycardic in the emergency room and was told that she was in  sinus rhythm or not. She has not had angina and denies PND or orthopnea. She did have a moderate amount of shortness of breath.   Past Medical History  Diagnosis Date  . Hypertension     Then hypotension 04/2012 limiting med adjustment.  . Diverticulitis   . Anxiety   . Mitral valve prolapse     a. H/o MVP, with severe MR 04/2012, awaiting surgery.  . Hyperlipidemia     recently- taken off Crestor- due to pain in her legs, but this was after cardiac cath. , pt. questioning whether the pain in her legs was related to cath. or crestor  . Goiter   . GERD (gastroesophageal reflux disease)   . Aortic stenosis 05/19/2012    trivial by TEE  . Atrial fibrillation     a. Dx 04/2012.  . Mitral regurgitation     a. Severe by TEE, pending surgery.  . Systolic CHF   . Refusal of blood transfusions as patient is Jehovah's Witness   . Pneumonia     treated /w antibiotic- [facility]- early Feb. 2014  . S/P Maze operation for atrial fibrillation 06/28/2012    Complete bilateral atrial lesion set using cryothermy ablation with oversewing of LA appendage via right minithoracotomy  . S/P mitral valve repair 06/28/2012    Complex valvuloplasty including quadrangular resection of posterior leaflet, sliding leaflet plasty, artificial Gore-tex neocord placement x2 and 32mm Sorin Memo 3D ring annuloplasty via right mini thoracotomy   . 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 Microscopic hematuria 08/20/2012       Past Surgical History  Procedure  Laterality Date  . Uterine fibroid surgery  1988  . Ovarian cyst surgery  1988  . Tubal ligation  1974  . Tee without cardioversion  05/19/2012    Procedure: TRANSESOPHAGEAL ECHOCARDIOGRAM (TEE);  Surgeon: Dolores Patty, MD;  Location: [facility] ENDOSCOPY;  Service: Cardiovascular;  Laterality: N/A;  . Mitral valve repair Right 06/28/2012    Procedure: MINIMALLY INVASIVE MITRAL VALVE REPAIR (MVR);  Surgeon: Purcell Nails, MD;  Location: [facility] OR;  Service: Open Heart  Surgery;  Laterality: Right;  . Minimally invasive maze procedure N/A 06/28/2012    Procedure: MINIMALLY INVASIVE MAZE PROCEDURE;  Surgeon: Purcell Nails, MD;  Location: [facility] OR;  Service: Open Heart Surgery;  Laterality: N/A;  . Intraoperative transesophageal echocardiogram N/A 06/28/2012    Procedure: INTRAOPERATIVE TRANSESOPHAGEAL ECHOCARDIOGRAM;  Surgeon: Purcell Nails, MD;  Location: [facility] OR;  Service: Open Heart Surgery;  Laterality: N/A;  . Urethral stricture dilatation    .  Allergies: is allergic to amiodarone; diltiazem; sulfa drugs cross reactors; aspirin; and naproxen sodium.   Medications: Prior to Admission medications   Medication Sig Start Date End Date Taking? Authorizing Provider  acetaminophen (TYLENOL) 500 MG tablet Take 1,000 mg by mouth every 6 (six) hours as needed for pain.    Yes Historical Provider, MD  ALPRAZolam (XANAX) 0.25 MG tablet Take 0.25 mg by mouth 3 (three) times daily as needed for anxiety. For anxiety   Yes Historical Provider, MD  diltiazem (CARDIZEM CD) 120 MG 24 hr capsule Take 1 capsule (120 mg total) by mouth daily. 10/25/12 10/25/13 Yes Kathleene Hazel, MD  docusate sodium (COLACE) 100 MG capsule Take 200 mg by mouth 2 (two) times daily.   Yes Historical Provider, MD  methimazole (TAPAZOLE) 10 MG tablet Take 20 mg by mouth daily.   Yes Historical Provider, MD  pantoprazole (PROTONIX) 40 MG tablet Take 40 mg by mouth daily. 08/24/12  Yes Minda Meo, MD  traMADol (ULTRAM) 50 MG tablet Take 50-100 mg by mouth every 4 (four) hours as needed for pain. 07/05/12  Yes Erin Barrett, PA-C    Family History:  Family Status  Relation Status Death Age  . Father Deceased 79    died of colon cancer  . Mother Deceased 73    Alzheimer's  . Brother Deceased 16    died in motorcycle accident    Social History:   reports that she has never smoked. She has never used smokeless tobacco. She reports that she does not drink alcohol or use illicit drugs.    History   Social History Narrative   Divorced - lives alone - remains functionally independent     Review of Systems: She is highly anxious. She complains of frequent palpitations and also has difficulty with a history of diverticulitis. She has significant urinary frequency. She has difficulty sleeping at night and is quite distressed and lonely. She has a history of depression. She does have some dyspnea on exertion and feels that things are worse on some of her medication. She has significant malaise and fatigue. Other than as noted above, the remainder of the review of systems is normal  Physical Exam: BP 107/40  Pulse 100  Temp(Src) 98 F (36.7 C) (Oral)  Resp 16  SpO2 99%  General appearance: Anxious appearing white female who is thin and is a rambling historian Head: Normocephalic, without obvious abnormality, atraumatic Eyes: conjunctivae/corneas clear. PERRL, EOM's intact. Fundi not examined Neck: no adenopathy, no carotid bruit, no JVD and  supple, symmetrical, trachea midline, thyroid mildly enlarged Lungs: clear to auscultation bilaterally Heart: regular rate and rhythm, S1, S2 normal, no murmur, click, rub or gallop Abdomen: soft, non-tender; bowel sounds normal; no masses,  no organomegaly Pelvic: deferred Extremities: extremities normal, atraumatic, no cyanosis or edema Pulses: 2+ and symmetric Skin: Skin color, texture, turgor normal. No rashes or lesions Neurologic: Grossly normal  Labs: CBC  Recent Labs  10/28/12 1230 10/28/12 1252  WBC 8.9  --   RBC 4.24  --   HGB 11.4* 12.2  HCT 34.7* 36.0  PLT 382  --   MCV 81.8  --   MCH 26.9  --   MCHC 32.9  --   RDW 16.5*  --   LYMPHSABS 1.2  --   MONOABS 0.5  --   EOSABS 0.0  --   BASOSABS 0.0  --    CMP   Recent Labs  10/28/12 1252  NA 140  K 3.7  CL 106  GLUCOSE 107*  BUN <3*  CREATININE 0.80   BNP (last 3 results)  Recent Labs  08/20/12 0333 08/21/12 0635 09/09/12 1522  PROBNP 2456.0*  1489.0* 1058.0*   Cardiac Panel (last 3 results) Troponin (Point of Care Test)  Recent Labs  10/28/12 1250  TROPIPOC 0.01   Thyroid  Lab Results  Component Value Date   TSH 0.008* 09/09/2012    EKG: Tachycardia. The rhythm appears different than her previous sinus rhythm. Unclear whether this is an atrial tachycardia that is ectopic or an atypical atrial flutter. She also has new T-wave changes in the lateral leads with QT prolongation.  Radiology: Mild cardiomegaly, coarsened interstitial markings   IMPRESSIONS: 1. Tachycardia that may reflect beta blocker withdrawal and intolerance to diltiazem possible atrial flutter 2. Transient amaurosis 3. Recent mitral valve repair for severe mitral regurgitation 4. Anxiety 5. History of hyperlipidemia 6. Hyperthyroidism thought due to amiodarone therapy  PLAN: New EKG changes would warrant observation overnight. Check serial cardiac enzymes. Discontinue diltiazem and placed back on beta blocker. She may be having some residual hyperthyroidism and will recheck thyroid function tests. Followup EKG  Signed: W. Ashley Royalty MD [facility] Cardiology  10/28/2012, 3:06 PM

## 2012-10-28 NOTE — ED Notes (Signed)
Dr. Beaton at the bedside. 

## 2012-10-28 NOTE — Telephone Encounter (Signed)
[name] [name] is a 72 y.o. female with a history of atrial fibrillation and mitral regurgitation, status post mitral valve repair and Maze procedure. Patient was previously on amiodarone. She developed hyperthyroidism and is currently on Tapazole. She recently saw Dr. Clifton James for palpitations. She had not been taking metoprolol. He placed her on diltiazem and ordered a Holter monitor. She called the answering service today with several complaints. She feels that she is having significant side effects from the diltiazem. She feels as thought her heart is slow and is stopping at times. She has been unable to sleep with this medication. She also notes frequent trips to the bathroom for urination and defecation. She tells me that she had side effects with diltiazem in the past and this was stopped. She also notes dyspnea during the night. She notes dyspnea with minimal activity in her house. She has slept on 2 pillows. She does not describe PND. She denies edema or weight gain. She denies chest pain or syncope. She does describe an episode of what sounds like Amaurosis fugax in her right eye this morning that last about 30 seconds.  Of note, she is off all anticoagulation.  I have advised her to go to the emergency room for further evaluation of a potential TIA. She also has increased dyspnea, which needs evaluation.  I advised her to restart metoprolol 25 mg one half tablet twice a day should she be discharged from the hospital.  I will have Dr. Gibson Ramp nurse contact her Monday morning. If she continues to have side effects with metoprolol, she may need a trial of several different beta blockers to see what she can tolerate.  She notes issues with taking higher doses of metoprolol in th past.  Signed, Tereso Newcomer, PA-C   10/28/2012 9:38 AM

## 2012-10-29 DIAGNOSIS — I509 Heart failure, unspecified: Secondary | ICD-10-CM

## 2012-10-29 DIAGNOSIS — I4891 Unspecified atrial fibrillation: Secondary | ICD-10-CM

## 2012-10-29 LAB — T4, FREE: Free T4: 1.37 ng/dL (ref 0.80–1.80)

## 2012-10-29 MED ORDER — FUROSEMIDE 20 MG PO TABS
10.0000 mg | ORAL_TABLET | Freq: Once | ORAL | Status: AC
  Administered 2012-10-29: 10 mg via ORAL
  Filled 2012-10-29: qty 0.5

## 2012-10-29 NOTE — Care Management Note (Signed)
Cm spoke with patient concerning discharge planning. Pt offered choice for HF home screen. Per pt choice [facility] to provide [facility] services. [facility] rep Christy notified of referral. Cm faxed facesheet, H/P, and MD orders to [facility] at [phone]. Pt states no DME required. Per pt adult assist with home care.    Roxy Manns Estephania Licciardi,RN,BSN [phone]

## 2012-10-29 NOTE — Progress Notes (Signed)
Patient ID: [name] [name], female   DOB: 04-15-1941, 72 y.o.   MRN: [id number] History and Physical  Primary Cardiologist: Dr. Doylene Bode Primary Physician:  Dr. Geoffry Paradise Subjective   Palpitations and shortness of breath.  HPI:  This 72 year old female has a long-standing history of anxiety and has a history of mitral valve prolapse. She developed shortness of breath pneumonia and was found in atrial fibrillation later found to have severe mitral regurgitation. She was evaluated at catheterization that showed no significant coronary artery disease but severe pulmonary hypertension and mitral regurgitation. She underwent mitral valve repair in March by Dr. Cornelius Moras and at the time had a Maze procedure done. She was on the [facility] following this which was later discontinued because of anemia in May when she was readmitted with fever. At that time she was found to have thyrotoxicosis it was thought to be induced by amiodarone given postoperatively and this was stopped and she was placed on Tapazole. She's had intermittent palpitations and was originally on beta blockers but when she was seen recently by the cardiologist the beta blocker was discontinued because she was only taking it once a day and she was placed on sustained-release diltiazem. She took that the past 2 nights but stated that it felt from a curable. She was unable to sleep, became anxious and complained of shortness of breath as well as palpitations while taking it. She presented to the emergency room she was found to have new changes in the lateral leads on the EKG. In addition she complained of 2 episodes of amaurosis where she fell to 70 shavers coming down over her eye on the right side. She had no other neurologic complaints. She was found to be tachycardic in the emergency room and was told that she was in sinus rhythm or not. She has not had angina and denies PND or orthopnea. She did have a moderate amount of shortness of  breath.     Scheduled Meds: . docusate sodium  200 mg Oral BID  . methimazole  20 mg Oral Daily  . metoprolol tartrate  25 mg Oral BID  . pantoprazole  40 mg Oral Daily  . pneumococcal 23 valent vaccine  0.5 mL Intramuscular Tomorrow-1000  . rivaroxaban  20 mg Oral Q supper   Continuous Infusions:  PRN Meds:.acetaminophen, ALPRAZolam, traMADol Family History:  Family Status  Relation Status Death Age  . Father Deceased 34    died of colon cancer  . Mother Deceased 74    Alzheimer's  . Brother Deceased 63    died in motorcycle accident    Social History:   reports that she has never smoked. She has never used smokeless tobacco. She reports that she does not drink alcohol or use illicit drugs.   History   Social History Narrative   Divorced - lives alone - remains functionally independent.  Has 3 children.     Review of Systems: She is highly anxious. She complains of frequent palpitations and also has difficulty with a history of diverticulitis. She has significant urinary frequency. She has difficulty sleeping at night and is quite distressed and lonely. She has a history of depression. She does have some dyspnea on exertion and feels that things are worse on some of her medication. She has significant malaise and fatigue. Other than as noted above, the remainder of the review of systems is normal  Physical Exam: BP 96/41  Pulse 78  Temp(Src) 97.8 F (36.6 C) (Oral)  Resp 18  Ht 5\' 6"  (1.676 m)  Wt 108 lb 3.2 oz (49.079 kg)  BMI 17.47 kg/m2  SpO2 100%  General appearance: Anxious appearing white female who is thin and is a rambling historian Head: Normocephalic, without obvious abnormality, atraumatic Eyes: conjunctivae/corneas clear. PERRL, EOM's intact. Fundi not examined Neck: no adenopathy, no carotid bruit, no JVD and supple, symmetrical, trachea midline, thyroid mildly enlarged Lungs: clear to auscultation bilaterally Heart: regular rate and rhythm, S1, S2  normal, no murmur, click, rub or gallop Abdomen: soft, non-tender; bowel sounds normal; no masses,  no organomegaly Pelvic: deferred Extremities: extremities normal, atraumatic, no cyanosis or edema Pulses: 2+ and symmetric Skin: Skin color, texture, turgor normal. No rashes or lesions Neurologic: Grossly normal  Labs: CBC  Recent Labs  10/28/12 1230 10/28/12 1252  WBC 8.9  --   RBC 4.24  --   HGB 11.4* 12.2  HCT 34.7* 36.0  PLT 382  --   MCV 81.8  --   MCH 26.9  --   MCHC 32.9  --   RDW 16.5*  --   LYMPHSABS 1.2  --   MONOABS 0.5  --   EOSABS 0.0  --   BASOSABS 0.0  --    CMP   Recent Labs  10/28/12 1540  NA 139  K 3.9  CL 103  CO2 25  GLUCOSE 100*  BUN 5*  CREATININE 0.75  CALCIUM 10.0  PROT 7.2  ALBUMIN 3.4*  AST 12  ALT 6  ALKPHOS 148*  BILITOT 0.4  GFRNONAA 83*  GFRAA >90   BNP (last 3 results)  Recent Labs  09/09/12 1522 10/28/12 1422 10/28/12 1539  PROBNP 1058.0* 4411.0* 4320.0*   Cardiac Panel (last 3 results) Troponin (Point of Care Test)  Recent Labs  10/28/12 1250  TROPIPOC 0.01   Thyroid  Lab Results  Component Value Date   TSH 0.008* 09/09/2012    EKG: Tachycardia. The rhythm appears different than her previous sinus rhythm. Unclear whether this is an atrial tachycardia that is ectopic or an atypical atrial flutter. She also has new T-wave changes in the lateral leads with QT prolongation.  Radiology: Mild cardiomegaly, coarsened interstitial markings   IMPRESSIONS: 1. Tachycardia that may reflect beta blocker withdrawal and intolerance to diltiazem Continue beta blocker and titrate as needed 2. Transient amaurosis 3. Recent mitral valve repair for severe mitral regurgitation 4. Anxiety 5. History of hyperlipidemia 6. Hyperthyroidism thought due to amiodarone therapy  PLAN: Titrate beta blocker as needed.  TSH pending.  ECG suggested flutter but telemetry this am clearly sinus with PVC;s.  Enzymes negative and no  chest pain does not need ischemic w/u  Consider lowering xarelto to 15 mg given age and body weight.  Previous anemia on xarelto but Hct stable Lasix x 1 dose today given elevated BNP and known decrease in EF  [facility]  10/29/2012, 8:12 AM

## 2012-10-29 NOTE — Progress Notes (Signed)
Patient rested quietly until 3am after taking Xanax.   Encouraged to return to sleep.

## 2012-10-30 ENCOUNTER — Telehealth: Payer: Self-pay | Admitting: Cardiology

## 2012-10-30 ENCOUNTER — Telehealth: Payer: Self-pay | Admitting: Physician Assistant

## 2012-10-30 ENCOUNTER — Encounter (HOSPITAL_COMMUNITY): Payer: Self-pay | Admitting: Nurse Practitioner

## 2012-10-30 DIAGNOSIS — G453 Amaurosis fugax: Secondary | ICD-10-CM

## 2012-10-30 DIAGNOSIS — E0590 Thyrotoxicosis, unspecified without thyrotoxic crisis or storm: Secondary | ICD-10-CM

## 2012-10-30 DIAGNOSIS — I4892 Unspecified atrial flutter: Secondary | ICD-10-CM

## 2012-10-30 DIAGNOSIS — F411 Generalized anxiety disorder: Secondary | ICD-10-CM

## 2012-10-30 MED ORDER — METOPROLOL TARTRATE 12.5 MG HALF TABLET
12.5000 mg | ORAL_TABLET | Freq: Two times a day (BID) | ORAL | Status: DC
  Filled 2012-10-30 (×2): qty 1

## 2012-10-30 MED ORDER — METOPROLOL TARTRATE 25 MG PO TABS: 12.5000 mg | ORAL_TABLET | Freq: Two times a day (BID) | ORAL | Status: DC

## 2012-10-30 MED ORDER — RIVAROXABAN 20 MG PO TABS: 20.0000 mg | ORAL_TABLET | Freq: Every day | ORAL | Status: DC

## 2012-10-30 NOTE — Telephone Encounter (Signed)
Called by patient due to onset of left flank pain. [name] [name] was just discharged from the hospital after admission for dyspnea and TIA-like symptoms. Medication changes include restarting metoprolol over diltiazem and restart of xarelto. Calling this evening after awakening with left flank pain. No relief with Tramadol and tylenol. Tearful on the phone. Had bowel movement today. No urinary symptoms. No history of kidney issues. No palpitations or chest pain. Unable to make diagnosis over the phone. She wants to try milk of magnesia in case it is GI related and she is very reluctant to go to the ER as she has had multiple visits. In compromise, my recommendation was to try MOM and if no relief, she seek emergency care.  Routing to Dr. Clifton James for follow up in AM if she does not seek emergency care.

## 2012-10-30 NOTE — Telephone Encounter (Signed)
Pt called back to let me know she opted for the option of taking the 15mg  tablet tonight, and understood the risk of lower dosing. She plans to pick up 20mg  prescription tomorrow. Eleanor Gatliff PA-C

## 2012-10-30 NOTE — Progress Notes (Signed)
Pt being d/c home, pt given dc instructions, follow up appointments and medications reviewed, pt verbalized understanding, pt left with daughter via wheelchair, pt stable

## 2012-10-30 NOTE — Discharge Summary (Signed)
See full note this am. cdm 

## 2012-10-30 NOTE — Discharge Summary (Signed)
Patient ID: [name] [name],  MRN: [id number], DOB/AGE: 05/25/1940 72 y.o.  Admit date: 10/28/2012 Discharge date: 10/30/2012  Primary Care Provider: ARONSON,RICHARD A Primary Cardiologist: C. Clifton James, MD   Discharge Diagnoses Principal Problem:   Dyspnea Active Problems:   Atrial flutter  **resolved  **xarelto reinitiated   Anxiety   Atrial fibrillation   S/P mitral valve repair   Amiodarone Induced Thyrotoxicosis   Acute congestive heart failure   Amaurosis fugax  **resolved prior to admission   Hyperlipidemia   Hypertension   Allergies Allergies  Allergen Reactions  . Amiodarone     Hyperthyroidism   . Diltiazem   . Sulfa Drugs Cross Reactors Other (See Comments)    Unknown.  . Aspirin Rash  . Naproxen Sodium Swelling and Rash   Procedures  None  History of Present Illness  72 year old female with history of mitral regurgitation status post mitral valve repair in March of 2014. She also has a history of paroxysmal atrial fibrillation with intolerance to amiodarone, which resulted in hyperthyroidism. She is now on Tapazole therapy. She had been experiencing intermittent palpitations and her beta blocker was recently discontinued in favor of long-acting diltiazem. Unfortunately, this resulted in her feeling as though her heartbeat was slow or stopping at times. She also felt as though this is causing her to have more frequent urination and bowel movements. On the day of admission, she had a brief, approximately 30 second, episode of reduced vision in her right eye. She called our on-call staff and reported symptoms and was advised to present to the ED for evaluation. There, she was found to be in a was felt to be atrial flutter. She was seen by cardiology and admitted for further evaluation.  Hospital Course  Following admission, diltiazem was discontinued in favor of resumption of beta blocker therapy. With this change, heart rhythm reverted to sinus rhythm. In the  setting of pt report of brief vision loss in the right eye, along with history of atrial arrhythmias, Xarelto therapy, which she had been on the past, was resumed. She was felt to have mild volume overload and proBNP was elevated at 4411. She was treated with one dose of IV Lasix. This morning, she is feeling better. She has maintained sinus rhythm with rates in the 70s to 80s. She'll be discharged home today in good condition. She is requested the lowest possible dose beta blocker and thus she'll be discharged home on 12.5 mg twice a day. She will continue Xarelto and f/u in the office as scheduled next week.  Of note, TSH was evaluated and remains low @ 0.244, though this is up compared to measurement in May (0.008 @ that time).   Discharge Vitals Blood pressure 112/51, pulse 85, temperature 98.2 F (36.8 C), temperature source Oral, resp. rate 17, height 5\' 6"  (1.676 m), weight 108 lb 3.9 oz (49.1 kg), SpO2 98.00%.  Filed Weights   10/28/12 1642 10/29/12 0620 10/30/12 0531  Weight: 107 lb 9.4 oz (48.8 kg) 108 lb 3.2 oz (49.079 kg) 108 lb 3.9 oz (49.1 kg)   Labs  CBC  Recent Labs  10/28/12 1230 10/28/12 1252  WBC 8.9  --   NEUTROABS 7.2  --   HGB 11.4* 12.2  HCT 34.7* 36.0  MCV 81.8  --   PLT 382  --    Basic Metabolic Panel  Recent Labs  10/28/12 1252 10/28/12 1540  NA 140 139  K 3.7 3.9  CL 106 103  CO2  --  25  GLUCOSE 107* 100*  BUN <3* 5*  CREATININE 0.80 0.75  CALCIUM  --  10.0   Liver Function Tests  Recent Labs  10/28/12 1540  AST 12  ALT 6  ALKPHOS 148*  BILITOT 0.4  PROT 7.2  ALBUMIN 3.4*   Cardiac Enzymes  Recent Labs  10/28/12 1539 10/28/12 2128  TROPONINI <0.30 <0.30   Thyroid Function Tests  Recent Labs  10/28/12 1540  TSH 0.244*   Disposition  Pt is being discharged home today in good condition.  Follow-up Plans & Appointments  Follow-up Information   Follow up with [facility], MD On 11/06/2012. (9:30 AM)    Contact  information:   [address] [place] [address] [phone]       Follow up with Purcell Nails, MD On 01/08/2013. (1 PM)    Contact information:   [address] [place] [address] [phone]      Discharge Medications    Medication List    STOP taking these medications       diltiazem 120 MG 24 hr capsule  Commonly known as:  CARDIZEM CD      TAKE these medications       acetaminophen 500 MG tablet  Commonly known as:  TYLENOL  Take 1,000 mg by mouth every 6 (six) hours as needed for pain.     ALPRAZolam 0.25 MG tablet  Commonly known as:  XANAX  Take 0.25 mg by mouth 3 (three) times daily as needed for anxiety. For anxiety     docusate sodium 100 MG capsule  Commonly known as:  COLACE  Take 200 mg by mouth 2 (two) times daily.     methimazole 10 MG tablet  Commonly known as:  TAPAZOLE  Take 20 mg by mouth daily.     metoprolol tartrate 25 MG tablet  Commonly known as:  LOPRESSOR  Take 0.5 tablets (12.5 mg total) by mouth 2 (two) times daily.     pantoprazole 40 MG tablet  Commonly known as:  PROTONIX  Take 40 mg by mouth daily.     Rivaroxaban 20 MG Tabs  Commonly known as:  XARELTO  Take 1 tablet (20 mg total) by mouth daily with supper.     traMADol 50 MG tablet  Commonly known as:  ULTRAM  Take 50-100 mg by mouth every 4 (four) hours as needed for pain.       Outstanding Labs/Studies  none  Duration of Discharge Encounter   Greater than 30 minutes including physician time.  Signed, Nicolasa Ducking NP 10/30/2012, 9:59 AM

## 2012-10-30 NOTE — Telephone Encounter (Signed)
Pt currently in hospital.

## 2012-10-30 NOTE — Telephone Encounter (Signed)
Pt called in after-hours with medication question. She was just discharged today after admission 7/5 for amarosis fugax and atrial flutter. She was discharged on Xarelto 20mg  daily (CrCl 78). She actually refused her doses the 2 nights she was in the hospital because she did not understand what it was for. Today she is calling in because she is not willing to go to the pharmacy tonight to pick it up due to the summer heat. She has Xarelto 15mg  tablets on hand from previous prescription that has not expired and was wondering if she can just take those instead. She does not feel comfortable cutting the tablets. I told her that the 20mg  dose was chosen based on a calculation of her weight and kidney function, so it needs to be specific dose in order to help prevent stroke. I asked the patient if she had any way of getting to the pharmacy tonight and she was initially adamant there is no one available to take her. D/w Dr. Purvis Sheffield - we told her it's OK for her to take a 15mg  tablet for tonight to get some coverage, but strongly advised she pick up rx first thing tomorrow. I explained that this may not provide the full effect of blood thinning and that she remains at higher risk of stroke if she does not take the 20mg  tablet. She then said she will try to get her neighbor or daughter to drive her tonight. She verbalized understanding and gratitude. Kery Haltiwanger PA-C

## 2012-10-30 NOTE — Progress Notes (Signed)
    SUBJECTIVE: Feeling better this am.   Tele: sinus, PVCs  BP 114/50  Pulse 78  Temp(Src) 97.3 F (36.3 C) (Oral)  Resp 20  Ht 5\' 6"  (1.676 m)  Wt 108 lb 3.9 oz (49.1 kg)  BMI 17.48 kg/m2  SpO2 99%  Intake/Output Summary (Last 24 hours) at 10/30/12 0750 Last data filed at 10/30/12 8413  Gross per 24 hour  Intake   1260 ml  Output   1375 ml  Net   -115 ml    PHYSICAL EXAM General: Well developed, well nourished, in no acute distress. Alert and oriented x 3.  Psych:  Good affect, responds appropriately Neck: No JVD. No masses noted.  Lungs: Clear bilaterally with no wheezes or rhonci noted.  Heart: RRR with no murmurs noted. Abdomen: Bowel sounds are present. Soft, non-tender.  Extremities: No lower extremity edema.   LABS: Basic Metabolic Panel:  Recent Labs  24/40/10 1252 10/28/12 1540  NA 140 139  K 3.7 3.9  CL 106 103  CO2  --  25  GLUCOSE 107* 100*  BUN <3* 5*  CREATININE 0.80 0.75  CALCIUM  --  10.0   CBC:  Recent Labs  10/28/12 1230 10/28/12 1252  WBC 8.9  --   NEUTROABS 7.2  --   HGB 11.4* 12.2  HCT 34.7* 36.0  MCV 81.8  --   PLT 382  --    Cardiac Enzymes:  Recent Labs  10/28/12 1539 10/28/12 2128  TROPONINI <0.30 <0.30    Current Meds: . docusate sodium  200 mg Oral BID  . methimazole  20 mg Oral Daily  . metoprolol tartrate  25 mg Oral BID  . pantoprazole  40 mg Oral Daily  . pneumococcal 23 valent vaccine  0.5 mL Intramuscular Tomorrow-1000  . rivaroxaban  20 mg Oral Q supper     ASSESSMENT AND PLAN:  1. Tachycardia: She does have a history of atrial fibrillation prior to her MV surgery. She has been in sinus since then but EKG on admission with possible atrial flutter. Now sinus. Beta blocker has been restarted. Dr. Donnie Aho has restarted her Xarelto at 20 mg po Qdaily. H/H stable. She demands to take lower dose of beta blocker. Will lower to Metoprolol 12.5 mg po BID. Will continue Xarelto. F/U in office 2 weeks.   2.  Transient amaurosis: Resolved.   3. Recent mitral valve repair for severe mitral regurgitation: Stable  4. Anxiety: Chronic, lifestyle limiting.   5. Hyperthyroidism:  thought due to amiodarone therapy. Followed in primary care.   6. Dispo: D/C home today.     Koen Antilla  7/7/20147:50 AM

## 2012-10-31 NOTE — Telephone Encounter (Signed)
See previous phone note for additional documentation

## 2012-10-31 NOTE — Telephone Encounter (Signed)
Pat, Can you follow up with her today? See two phone notes from last night. She had question about dosing of Xarelto. I would be ok with 15 mg dose of Xarelto if that is what she has been taking and has on hand at home. Can you speak with her about this? Also, she called in with flank pain last night (see note). If she is still having issues, needs to be seen in primary care today. Thanks, chris

## 2012-10-31 NOTE — Telephone Encounter (Signed)
Spoke with pt. She reports she developed flank pain 45 minutes after taking Xarelto 15 mg last night. She ate cheese sandwich with Xarelto.  She had BM last night and this AM.  Up 4-5 times during night urinating. No flank pain this morning. Feels weak. Tearful on phone. I told pt Dr. Clifton James would like her to take Xarelto and she could take the 15 mg tablets if she would like.  She is not sure she is going to take again and wants me to let Dr. Clifton James she is terrified to take Xarelto as she thinks this caused her to have pain last night.  She is aware Xarelto is for stroke prevention and the importance of taking.  I have told her if she continues to have frequent urination or flank pain returns she should be seen by her primary care doctor today.  I have instructed pt not to drive while feeling weak.

## 2012-10-31 NOTE — Telephone Encounter (Signed)
See phone note. cdm

## 2012-11-01 ENCOUNTER — Telehealth: Payer: Self-pay | Admitting: Nurse Practitioner

## 2012-11-01 ENCOUNTER — Encounter (HOSPITAL_COMMUNITY): Payer: Self-pay | Admitting: Emergency Medicine

## 2012-11-01 ENCOUNTER — Emergency Department (HOSPITAL_COMMUNITY): Payer: Medicare Other

## 2012-11-01 ENCOUNTER — Emergency Department (HOSPITAL_COMMUNITY)
Admission: EM | Admit: 2012-11-01 | Discharge: 2012-11-01 | Disposition: A | Discharge status: Home / Self Care | Payer: Medicare Other | Attending: Emergency Medicine | Admitting: Emergency Medicine

## 2012-11-01 DIAGNOSIS — Z862 Personal history of diseases of the blood and blood-forming organs and certain disorders involving the immune mechanism: Secondary | ICD-10-CM | POA: Insufficient documentation

## 2012-11-01 DIAGNOSIS — Z7901 Long term (current) use of anticoagulants: Secondary | ICD-10-CM | POA: Insufficient documentation

## 2012-11-01 DIAGNOSIS — Z79899 Other long term (current) drug therapy: Secondary | ICD-10-CM | POA: Insufficient documentation

## 2012-11-01 DIAGNOSIS — Z9889 Other specified postprocedural states: Secondary | ICD-10-CM | POA: Insufficient documentation

## 2012-11-01 DIAGNOSIS — Z951 Presence of aortocoronary bypass graft: Secondary | ICD-10-CM | POA: Insufficient documentation

## 2012-11-01 DIAGNOSIS — Z8639 Personal history of other endocrine, nutritional and metabolic disease: Secondary | ICD-10-CM | POA: Insufficient documentation

## 2012-11-01 DIAGNOSIS — I10 Essential (primary) hypertension: Secondary | ICD-10-CM | POA: Insufficient documentation

## 2012-11-01 DIAGNOSIS — I509 Heart failure, unspecified: Secondary | ICD-10-CM | POA: Insufficient documentation

## 2012-11-01 DIAGNOSIS — K219 Gastro-esophageal reflux disease without esophagitis: Secondary | ICD-10-CM | POA: Insufficient documentation

## 2012-11-01 DIAGNOSIS — F419 Anxiety disorder, unspecified: Secondary | ICD-10-CM

## 2012-11-01 DIAGNOSIS — R011 Cardiac murmur, unspecified: Secondary | ICD-10-CM | POA: Insufficient documentation

## 2012-11-01 DIAGNOSIS — R0602 Shortness of breath: Secondary | ICD-10-CM | POA: Insufficient documentation

## 2012-11-01 DIAGNOSIS — I4891 Unspecified atrial fibrillation: Secondary | ICD-10-CM | POA: Insufficient documentation

## 2012-11-01 DIAGNOSIS — Z8679 Personal history of other diseases of the circulatory system: Secondary | ICD-10-CM | POA: Insufficient documentation

## 2012-11-01 DIAGNOSIS — Z8701 Personal history of pneumonia (recurrent): Secondary | ICD-10-CM | POA: Insufficient documentation

## 2012-11-01 DIAGNOSIS — F411 Generalized anxiety disorder: Secondary | ICD-10-CM | POA: Insufficient documentation

## 2012-11-01 DIAGNOSIS — Z87448 Personal history of other diseases of urinary system: Secondary | ICD-10-CM | POA: Insufficient documentation

## 2012-11-01 DIAGNOSIS — I2510 Atherosclerotic heart disease of native coronary artery without angina pectoris: Secondary | ICD-10-CM | POA: Insufficient documentation

## 2012-11-01 DIAGNOSIS — Z8719 Personal history of other diseases of the digestive system: Secondary | ICD-10-CM | POA: Insufficient documentation

## 2012-11-01 HISTORY — DX: Atherosclerotic heart disease of native coronary artery without angina pectoris: I25.10

## 2012-11-01 LAB — CBC
Hemoglobin: 10.7 g/dL — ABNORMAL LOW (ref 12.0–15.0)
MCH: 26.9 pg (ref 26.0–34.0)
Platelets: 319 10*3/uL (ref 150–400)
RBC: 3.98 MIL/uL (ref 3.87–5.11)
WBC: 13.8 10*3/uL — ABNORMAL HIGH (ref 4.0–10.5)

## 2012-11-01 LAB — BASIC METABOLIC PANEL
CO2: 27 mEq/L (ref 19–32)
Calcium: 9.6 mg/dL (ref 8.4–10.5)
Chloride: 99 mEq/L (ref 96–112)
Glucose, Bld: 121 mg/dL — ABNORMAL HIGH (ref 70–99)
Potassium: 3.9 mEq/L (ref 3.5–5.1)
Sodium: 134 mEq/L — ABNORMAL LOW (ref 135–145)

## 2012-11-01 LAB — PROTIME-INR
INR: 1.14 (ref 0.00–1.49)
Prothrombin Time: 14.4 seconds (ref 11.6–15.2)

## 2012-11-01 LAB — POCT I-STAT TROPONIN I

## 2012-11-01 LAB — PRO B NATRIURETIC PEPTIDE: Pro B Natriuretic peptide (BNP): 1433 pg/mL — ABNORMAL HIGH (ref 0–125)

## 2012-11-01 NOTE — ED Notes (Addendum)
PT. REPORTS CHEST PAIN UNDER RIGHT BREAST WITH PROGRESSING SOB ONSET LAST NIGHT WITH OCCASIONAL DRY COUGH , DENIES NAUSEA OR DIAPHORESIS , PT. STATED HISTORY OF CAD / CABG.

## 2012-11-01 NOTE — Telephone Encounter (Signed)
[name] [name] called again this evening stating that she's been having abdominal/epigastric and flank pain since starting xarelto.  She's also feeling more sob than usual and orthopneic.  She's scared and doesn't want to come back to the ED.  I advised that given her multiple complaints over the past few days, which have only gotten worse and now are associated with dyspnea, that she may be best served by coming into the ED for evaluation.  She would like to avoid this and plans to see how things go tonight.  She does not plan to take xarelto tonight and will come in if she doesn't feel better.

## 2012-11-01 NOTE — ED Notes (Signed)
Patient transported to X-ray 

## 2012-11-01 NOTE — ED Notes (Signed)
Patient has returned from xray 

## 2012-11-01 NOTE — ED Notes (Signed)
Patient presents with a 2-day hx SOB that has gotten progressively worse. States that "it's hard to catch my breath sometimes." Also has had some right sided chest pain that radiates to the left underneath both breasts. Additionally, she has had some left sided lower back pain. Endorses nausea and dizziness. Denies headaches, vomiting, abdominal pain, diarrhea or constipation. No fevers, sweats or chills. No urinary sx (hematuria, dysuria, frequency, urgency or vaginal discharge).

## 2012-11-01 NOTE — ED Provider Notes (Signed)
History    CSN: [id number] Arrival date & time 11/01/12  2006  First MD Initiated Contact with Patient 11/01/12 2034     Chief Complaint  Patient presents with  . Chest Pain  . Shortness of Breath   (Consider location/radiation/quality/duration/timing/severity/associated sxs/prior Treatment) Patient is a 72 y.o. female presenting with chest pain and shortness of breath. The history is provided by the patient and a relative.  Chest Pain Associated symptoms: shortness of breath   Associated symptoms comment:  [name] [name] is a 72 y.o. female h/o anxiety, hyperthyroidism, presenting for shortness of breath.  Patient frequently sees her PCP and call in for similar complaints.  She states she was laying in bed tonigtht and experience sudden onset SOB.  She denies chest pain, fever, cough, swelling, DOE, or sleep orthopnea.  She states she has had episodes like this in the past that also resolve spontaneously.  ROS is otherwise neagtive. Shortness of Breath Associated symptoms: chest pain    Past Medical History  Diagnosis Date  . Hypertension     Then hypotension 04/2012 limiting med adjustment.  . Diverticulitis   . Anxiety   . Mitral valve prolapse     a. H/o MVP, with severe MR 04/2012, had MV repair and Maze  . Hyperlipidemia     recently- taken off Crestor- due to pain in her legs, but this was after cardiac cath. , pt. questioning whether the pain in her legs was related to cath. or crestor  . Goiter   . GERD (gastroesophageal reflux disease)   . Aortic stenosis 05/19/2012    trivial by TEE  . Atrial fibrillation     a. Dx 04/2012;  b. Amio d/c'd 2/2 hyperthyroidism;  c. Atrial flutter 10/2012;  d. On xarelto.  . Refusal of blood transfusions as patient is Jehovah's Witness   . Pneumonia     treated /w antibiotic- [facility]- early Feb. 2014  . S/P Maze operation for atrial fibrillation 06/28/2012    Complete bilateral atrial lesion set using cryothermy ablation with oversewing of LA  appendage via right minithoracotomy  . S/P mitral valve repair 06/28/2012    Complex valvuloplasty including quadrangular resection of posterior leaflet, sliding leaflet plasty, artificial Gore-tex neocord placement x2 and 32mm Sorin Memo 3D ring annuloplasty via right mini thoracotomy   . 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 Microscopic hematuria 08/20/2012  . Shortness of breath   . Heart murmur   . CHF (congestive heart failure)   . Anemia   . Coronary artery disease    Past Surgical History  Procedure Laterality Date  . Uterine fibroid surgery  1988  . Ovarian cyst surgery  1988  . Tubal ligation  1974  . Tee without cardioversion  05/19/2012    Procedure: TRANSESOPHAGEAL ECHOCARDIOGRAM (TEE);  Surgeon: Dolores Patty, MD;  Location: [facility] ENDOSCOPY;  Service: Cardiovascular;  Laterality: N/A;  . Mitral valve repair Right 06/28/2012    Procedure: MINIMALLY INVASIVE MITRAL VALVE REPAIR (MVR);  Surgeon: Purcell Nails, MD;  Location: [facility] OR;  Service: Open Heart Surgery;  Laterality: Right;  . Minimally invasive maze procedure N/A 06/28/2012    Procedure: MINIMALLY INVASIVE MAZE PROCEDURE;  Surgeon: Purcell Nails, MD;  Location: [facility] OR;  Service: Open Heart Surgery;  Laterality: N/A;  . Intraoperative transesophageal echocardiogram N/A 06/28/2012    Procedure: INTRAOPERATIVE TRANSESOPHAGEAL ECHOCARDIOGRAM;  Surgeon: Purcell Nails, MD;  Location: [facility] OR;  Service: Open Heart Surgery;  Laterality: N/A;  . Urethral  stricture dilatation    . Coronary artery bypass graft     Family History  Problem Relation Age of Onset  . Colon cancer Father    History  Substance Use Topics  . Smoking status: Never Smoker   . Smokeless tobacco: Never Used  . Alcohol Use: No     Comment: 1 drink a month   OB History   Grav Para Term Preterm Abortions TAB SAB Ect Mult Living                 Review of Systems  Respiratory: Positive for shortness of breath.   Cardiovascular: Positive for  chest pain.  10 Systems reviewed and are negative for acute change except as noted in the HPI.   Allergies  Amiodarone; Diltiazem; Sulfa drugs cross reactors; Aspirin; and Naproxen sodium  Home Medications   Current Outpatient Rx  Name  Route  Sig  Dispense  Refill  . acetaminophen (TYLENOL) 500 MG tablet   Oral   Take 1,000 mg by mouth every 6 (six) hours as needed for pain.          [name] ALPRAZolam (XANAX) 0.25 MG tablet   Oral   Take 0.25 mg by mouth 3 (three) times daily as needed for anxiety. For anxiety         . docusate sodium (COLACE) 100 MG capsule   Oral   Take 200 mg by mouth 2 (two) times daily.         . methimazole (TAPAZOLE) 10 MG tablet   Oral   Take 20 mg by mouth daily.         . metoprolol tartrate (LOPRESSOR) 25 MG tablet   Oral   Take 0.5 tablets (12.5 mg total) by mouth 2 (two) times daily.   30 tablet   6   . pantoprazole (PROTONIX) 40 MG tablet   Oral   Take 40 mg by mouth daily.         . Rivaroxaban (XARELTO) 15 MG TABS tablet   Oral   Take 15 mg by mouth daily with supper.         . traMADol (ULTRAM) 50 MG tablet   Oral   Take 50-100 mg by mouth every 4 (four) hours as needed for pain.          BP 110/53  Pulse 96  Temp(Src) 98 F (36.7 C) (Oral)  Resp 17  SpO2 96% Physical Exam  Nursing note and vitals reviewed. Constitutional: She is oriented to person, place, and time. She appears well-developed and well-nourished. No distress.  HENT:  Head: Normocephalic and atraumatic.  Nose: Nose normal.  Mouth/Throat: Oropharynx is clear and moist.  Eyes: Conjunctivae and EOM are normal. Pupils are equal, round, and reactive to light. No scleral icterus.  Neck: Normal range of motion. Neck supple. No JVD present. No tracheal deviation present. No thyromegaly present.  Cardiovascular: Normal rate, regular rhythm and normal heart sounds.  Exam reveals no gallop and no friction rub.   No murmur heard. Pulmonary/Chest: Effort  normal and breath sounds normal. No respiratory distress. She has no wheezes. She exhibits no tenderness.  Abdominal: Soft. Bowel sounds are normal. She exhibits no distension and no mass. There is no tenderness. There is no rebound and no guarding.  Musculoskeletal: Normal range of motion. She exhibits no edema and no tenderness.  Lymphadenopathy:    She has no cervical adenopathy.  Neurological: She is alert and oriented to person, place, and time.  Skin: Skin is warm and dry. No rash noted. No erythema. No pallor.    ED Course  Procedures (including critical care time) Labs Reviewed  CBC - Abnormal; Notable for the following:    WBC 13.8 (*)    Hemoglobin 10.7 (*)    HCT 33.1 (*)    RDW 17.0 (*)    All other components within normal limits  BASIC METABOLIC PANEL - Abnormal; Notable for the following:    Sodium 134 (*)    Glucose, Bld 121 (*)    GFR calc non Af Amer 82 (*)    All other components within normal limits  PRO B NATRIURETIC PEPTIDE - Abnormal; Notable for the following:    Pro B Natriuretic peptide (BNP) 1433.0 (*)    All other components within normal limits  PROTIME-INR  PRO B NATRIURETIC PEPTIDE  POCT I-STAT TROPONIN I   Dg Chest 2 View  11/01/2012   *RADIOLOGY REPORT*  Clinical Data: Chest pain, shortness of breath, history hypertension, CHF  CHEST - 2 VIEW  Comparison: 10/28/2012 Correlation:  CT chest 09/09/2012  Findings: Mild enlargement cardiac silhouette post MVR. Atherosclerotic calcification aorta. Chronic tracheal deviation right to left question right thyroid mass. Mediastinal contours and pulmonary vascularity otherwise normal. Emphysematous changes with small left pleural effusion. No acute infiltrate pneumothorax. Bones appear diffusely demineralized with broad-based levoconvex thoracolumbar scoliosis.  IMPRESSION: Enlargement of cardiac silhouette. COPD changes with new small left pleural effusion. Tracheal deviation right-to-left with mild transverse  compression secondary to the right thyroid enlargement/mass identified on recent CT; this has been previously evaluated by ultrasound and previously biopsied - see ultrasound report of 08/23/3012.   Original Report Authenticated By: Ulyses Southward, M.D.   1. Anxiety     MDM  Patient symptoms have resolved since presenting to the ED and have not recurred.  All labs and cxr findings are within normal limits.  I spoke with [facility] regarding this patient briefly over the phone and they expressed that this is a chronic recurring event for her that is not acute.  They were ok with a DC home.  Patient is also amendable with this plan.  PCP fu in 1 week  [name] Crumble, MD 11/01/12 2251

## 2012-11-02 NOTE — ED Provider Notes (Signed)
I examined the patient (with the resident physician) and agree with the note, with the following additional thoughts.  I also saw the ECG and all studies as performed and agree with the interpretation.  On my exam the patient was anxious appearing, but in no distress, hemodynamically stable. Patient's labs are similar to prior evaluations, and after a discussion with the patient's primary care physician she is discharged in stable condition.  Gerhard Munch, MD 11/02/12 [phone]

## 2012-11-06 ENCOUNTER — Encounter: Payer: Self-pay | Admitting: Cardiovascular Disease

## 2012-11-06 ENCOUNTER — Ambulatory Visit (INDEPENDENT_AMBULATORY_CARE_PROVIDER_SITE_OTHER): Payer: Medicare Other | Admitting: Cardiovascular Disease

## 2012-11-06 VITALS — BP 132/64 | HR 112 | Ht 66.0 in | Wt 111.0 lb

## 2012-11-06 DIAGNOSIS — I10 Essential (primary) hypertension: Secondary | ICD-10-CM

## 2012-11-06 DIAGNOSIS — I4891 Unspecified atrial fibrillation: Secondary | ICD-10-CM

## 2012-11-06 DIAGNOSIS — I059 Rheumatic mitral valve disease, unspecified: Secondary | ICD-10-CM

## 2012-11-06 NOTE — Progress Notes (Signed)
History of Present Illness: 72 y.o. female with history of chronic systolic CHF, severe MR now s/p mitral valve repair and MAZE, aortic stenosis, HTN, HLD, GERD, atrial fibrillation who is here today for follow up. She was admitted to [facility] 1/20-1/29/14 with atrial fibrillation with RVR, acute CHF. Echo 05/16/12: EF 40-45%, diffuse HK, severe prolapse of the mitral valve involving the anterior and posterior leaflets, severe MR, moderate LAE, mild RAE, small pericardial effusion. TEE 05/19/12: EF 45%, moderately calcified aorta, severe prolapse of the mitral valve involving the P2 scallop of the posterior leaflet, severe MR, severe LAE, mild RAE, no PFO. Patient was diuresed with IV Lasix due to systolic CHF. Right and left heart cath 05/22/12: No CAD, EF 40%, PCWP 15, Fick CO 4.1. She was placed on amiodarone for rate control. She was placed on low-dose Xarelto 15 mg daily for anticoagulation. She has since undergone minimally invasive mitral valve repair and Maze procedure by Dr. Cornelius Moras on 06/28/2012 . Postoperatively, she remained in sinus rhythm on Amiodarone and Xarelto. She had postoperative blood loss anemia, but was treated conservatively with no transfusion in light of her being a Jehovah's Witness. She was discharged to [facility] for continued rehab on 07/06/2012. Her amiodarone was stopped on 08/18/12 at an outpatient appt with Dr. Cornelius Moras. She presented to the ER on 08/20/12 feeling poorly with complaints of back and abdominal pain. She has had increased SOB, epigastric abdominal pain and chills with a temp of 102.6. In the ER pro BNP was elevated. She was treated with Lasix IV and antibiotics but in the ER she had a drop in her oxygen saturation and BP. Abdominal CT and ultrasound were normal. She was found to have thyrotoxicosis. She was still anemic so Xarelto was stopped in the hospital. She was in sinus rhythm so amiodarone was not restarted. Her metoprolol was restarted at 12.5 mg po BID before  discharge. Her thyroid disorder has been followed closely in primary care. She was seen in our office 10/25/12 with c/o palpitations, dizziness improved with Xanax. She had not been taking Metoprolol as advised. I started Cardizem but she did not like this and was admitted 10/28/12 with c/o SOB. Her Cardizem was stopped and Metoprolol was restarted at 12.5 mg po BID. She was restarted on Xarelto but called that night after discharge to complain about flank pain that she related to Xarelto. She was seen in the ED and evaluated 11/01/12 and no changes were made.   She is here today for follow up. Feeling better overall but some dizziness and some SOB. No chest pain. She is planning to see Dr. [name] later today for f/u with thyroid. No LE edema. No syncope. Taking metoprolol. Refusing to take anti-coagulation.   Primary Care Physician: Geoffry Paradise  Past Medical History  Diagnosis Date  . Hypertension     Then hypotension 04/2012 limiting med adjustment.  . Diverticulitis   . Anxiety   . Mitral valve prolapse     a. H/o MVP, with severe MR 04/2012, had MV repair and Maze  . Hyperlipidemia     recently- taken off Crestor- due to pain in her legs, but this was after cardiac cath. , pt. questioning whether the pain in her legs was related to cath. or crestor  . Goiter   . GERD (gastroesophageal reflux disease)   . Aortic stenosis 05/19/2012    trivial by TEE  . Atrial fibrillation     a. Dx 04/2012;  b. Amio  d/c'd 2/2 hyperthyroidism;  c. Atrial flutter 10/2012;  d. On xarelto.  . Refusal of blood transfusions as patient is Jehovah's Witness   . Pneumonia     treated /w antibiotic- [facility]- early Feb. 2014  . S/P Maze operation for atrial fibrillation 06/28/2012    Complete bilateral atrial lesion set using cryothermy ablation with oversewing of LA appendage via right minithoracotomy  . S/P mitral valve repair 06/28/2012    Complex valvuloplasty including quadrangular resection of posterior leaflet,  sliding leaflet plasty, artificial Gore-tex neocord placement x2 and 32mm Sorin Memo 3D ring annuloplasty via right mini thoracotomy   . 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 Microscopic hematuria 08/20/2012  . Shortness of breath   . Heart murmur   . CHF (congestive heart failure)   . Anemia   . Coronary artery disease     Past Surgical History  Procedure Laterality Date  . Uterine fibroid surgery  1988  . Ovarian cyst surgery  1988  . Tubal ligation  1974  . Tee without cardioversion  05/19/2012    Procedure: TRANSESOPHAGEAL ECHOCARDIOGRAM (TEE);  Surgeon: Dolores Patty, MD;  Location: [facility] ENDOSCOPY;  Service: Cardiovascular;  Laterality: N/A;  . Mitral valve repair Right 06/28/2012    Procedure: MINIMALLY INVASIVE MITRAL VALVE REPAIR (MVR);  Surgeon: Purcell Nails, MD;  Location: [facility] OR;  Service: Open Heart Surgery;  Laterality: Right;  . Minimally invasive maze procedure N/A 06/28/2012    Procedure: MINIMALLY INVASIVE MAZE PROCEDURE;  Surgeon: Purcell Nails, MD;  Location: [facility] OR;  Service: Open Heart Surgery;  Laterality: N/A;  . Intraoperative transesophageal echocardiogram N/A 06/28/2012    Procedure: INTRAOPERATIVE TRANSESOPHAGEAL ECHOCARDIOGRAM;  Surgeon: Purcell Nails, MD;  Location: [facility] OR;  Service: Open Heart Surgery;  Laterality: N/A;  . Urethral stricture dilatation    . Coronary artery bypass graft      Current Outpatient Prescriptions  Medication Sig Dispense Refill  . acetaminophen (TYLENOL) 500 MG tablet Take 1,000 mg by mouth every 6 (six) hours as needed for pain.       [name] ALPRAZolam (XANAX) 0.25 MG tablet Take 0.25 mg by mouth 2 (two) times daily as needed for anxiety. For anxiety      . docusate sodium (COLACE) 100 MG capsule Take 200 mg by mouth 2 (two) times daily.      . methimazole (TAPAZOLE) 10 MG tablet Take 20 mg by mouth daily.      . metoprolol tartrate (LOPRESSOR) 25 MG tablet Take 0.5 tablets (12.5 mg total) by mouth 2 (two) times daily.  30  tablet  6  . pantoprazole (PROTONIX) 40 MG tablet Take 40 mg by mouth daily.      . traMADol (ULTRAM) 50 MG tablet Take 50 mg by mouth 2 (two) times daily as needed for pain.        No current facility-administered medications for this visit.    Allergies  Allergen Reactions  . Amiodarone     Hyperthyroidism   . Diltiazem   . Sulfa Drugs Cross Reactors Other (See Comments)    Unknown.  . Aspirin Rash  . Naproxen Sodium Swelling and Rash    History   Social History  . Marital Status: Divorced    Spouse Name: N/A    Number of Children: N/A  . Years of Education: N/A   Occupational History  . Not on file.   Social History Main Topics  . Smoking status: Never Smoker   . Smokeless tobacco: Never  Used  . Alcohol Use: No     Comment: 1 drink a month  . Drug Use: No  . Sexually Active: No   Other Topics Concern  . Not on file   Social History Narrative   Divorced - lives alone - remains functionally independent.  Has 3 children.    Family History  Problem Relation Age of Onset  . Colon cancer Father     Review of Systems:  As stated in the HPI and otherwise negative.   BP 132/64  Pulse 112  Ht 5\' 6"  (1.676 m)  Wt 111 lb (50.349 kg)  BMI 17.92 kg/m2  SpO2 97%  Physical Examination: General: Well developed, well nourished, NAD HEENT: OP clear, mucus membranes moist SKIN: warm, dry. No rashes. Neuro: No focal deficits Musculoskeletal: Muscle strength 5/5 all ext Psychiatric: Mood and affect normal Neck: No JVD, no carotid bruits, no thyromegaly, no lymphadenopathy. Lungs:Clear bilaterally, no wheezes, rhonci, crackles Cardiovascular: Tachy, regular with ectopy. No murmurs, gallops or rubs. Abdomen:Soft. Bowel sounds present. Non-tender.  Extremities: No lower extremity edema. Pulses are 2 + in the bilateral DP/PT.  EKG: Sinus tach, PVCs. Non-specific T wave abnormalities diffusely, unchanged.   Echo April 2014: Left ventricle: Hypokinesis at the base of  the inferior wall. The cavity size was normal. Wall thickness was increased in a pattern of mild LVH. The estimated ejection fraction was 55%. - Mitral valve: The mitral valve repair is intact. There is no MR or MS. Valve area by pressure half-time: 2.47cm^2. - Left atrium: The atrium was mildly dilated.  Assessment and Plan:   1. Mitral valve regurgitation: s/p repair. Examination normal today but pt is worried that her recent symptoms of dizziness are due to valve issues. Will repeat echo to make sure mitral valve repair is intact.   2. Atrial fibrillation: s/p MAZE. Sinus tach today. No anticoagulation at this time as she refuses. Continue metoprolol at current dose.  Continue xanax prn. She has an ASA allergy.   3. PVCs: 48 hour monitor was turned in today. Will review this week and call her with results. I suspect she will have frequent PVCs as seen on her EKG last several visits.   4. HTN: BP controlled. Would not make any changes to current regimen.

## 2012-11-06 NOTE — Patient Instructions (Addendum)
Your physician wants you to follow-up in: 3 months.  You will receive a reminder letter in the mail two months in advance. If you don't receive a letter, please call our office to schedule the follow-up appointment.  Your physician has requested that you have an echocardiogram. Echocardiography is a painless test that uses sound waves to create images of your heart. It provides your doctor with information about the size and shape of your heart and how well your heart's chambers and valves are working. This procedure takes approximately one hour. There are no restrictions for this procedure.

## 2012-11-09 ENCOUNTER — Telehealth: Payer: Self-pay | Admitting: Cardiovascular Disease

## 2012-11-09 NOTE — Telephone Encounter (Signed)
Spoke with pt and told her results of monitor were not available yet and that I would call her after reviewed by Dr. Clifton James. Pt also has billing questions regarding echo. I told pt I would have billing department contact her

## 2012-11-09 NOTE — Telephone Encounter (Signed)
Follow Up ° ° ° ° ° °Pt calling following up on monitor results. Please call. °

## 2012-11-10 ENCOUNTER — Telehealth: Payer: Self-pay | Admitting: Cardiovascular Disease

## 2012-11-10 NOTE — Telephone Encounter (Signed)
Pt called concerning her echo being billed as outpt hosp or office.  Advised pt if done here in [place] it would be billed as outpt.  Pt wanted to cancel as she can't afford echo as outpt hosp but would be willing to do at [facility] since that would be office based charges.  She will get her daughter in law to drive her to [place].  She will call back to r/s.

## 2012-11-14 ENCOUNTER — Other Ambulatory Visit (HOSPITAL_COMMUNITY): Payer: Medicare Other

## 2012-11-23 ENCOUNTER — Telehealth: Payer: Self-pay | Admitting: *Deleted

## 2012-11-23 NOTE — Telephone Encounter (Signed)
I placed call to pt to review monitor results. Left message to call back 

## 2012-11-23 NOTE — Telephone Encounter (Signed)
Left message on home phone and cell phone to call back.

## 2012-12-04 NOTE — Telephone Encounter (Signed)
Left message on home and cell numbers to call back 

## 2012-12-06 ENCOUNTER — Encounter: Payer: Self-pay | Admitting: *Deleted

## 2012-12-06 NOTE — Telephone Encounter (Signed)
Letter sent to pt asking her to contact office to review results.  

## 2012-12-12 NOTE — Telephone Encounter (Signed)
Pt was given results and recommendations from 48 hr monitor. Pt declines increasing metoprolol at this time. Pt has switched cardiologists and has went back to one she saw prior. I will take report to MR to be scanned.

## 2012-12-12 NOTE — Telephone Encounter (Signed)
New Problem  Pt recieved a Letter from nurse Adleman to please call office and review results of heart monitor.//pt request a call back

## 2012-12-13 NOTE — Telephone Encounter (Signed)
Agree. Thanks

## 2013-01-08 ENCOUNTER — Ambulatory Visit (INDEPENDENT_AMBULATORY_CARE_PROVIDER_SITE_OTHER): Payer: Medicare Other | Admitting: Thoracic Surgery (Cardiothoracic Vascular Surgery)

## 2013-01-08 ENCOUNTER — Encounter: Payer: Self-pay | Admitting: Thoracic Surgery (Cardiothoracic Vascular Surgery)

## 2013-01-08 VITALS — BP 114/63 | HR 96 | Resp 20 | Ht 64.0 in | Wt 111.0 lb

## 2013-01-08 DIAGNOSIS — Z8679 Personal history of other diseases of the circulatory system: Secondary | ICD-10-CM | POA: Insufficient documentation

## 2013-01-08 DIAGNOSIS — I059 Rheumatic mitral valve disease, unspecified: Secondary | ICD-10-CM

## 2013-01-08 DIAGNOSIS — Z9889 Other specified postprocedural states: Secondary | ICD-10-CM

## 2013-01-08 DIAGNOSIS — I340 Nonrheumatic mitral (valve) insufficiency: Secondary | ICD-10-CM

## 2013-01-08 NOTE — Progress Notes (Signed)
      [address]       [name] [name] [address]             [phone]     CARDIOTHORACIC SURGERY OFFICE NOTE  Referring Provider is Verne Carrow, MD PCP is Minda Meo, MD   HPI:  Patient returns for routine followup and rhythm check status post minimally invasive mitral valve repair and Maze procedure on 06/28/2012. She developed thyrotoxicosis on amiodarone therapy for which she has been followed carefully by Dr. Jacky [name].  She was last seen here in our office on 09/25/2012.  She was seen in followup in early July by Dr. Clifton James at which time she was complaining of palpitations.  A 48 hour Holter monitor study was performed demonstrating sinus rhythm with frequent PVCs. There was no atrial fibrillation.  Since then she has changed cardiologists and she is now following up Dr. Donnie Aho who has started her on low-dose atenolol. The patient has also started taking 81 mg aspirin on a daily basis. She reports feeling much better years she complains that she still cannot gain any weight. She is no longer having any palpitations. She denies any significant shortness of breath.  She does still report some pain in her right breast related to her minithoracotomy.   Current Outpatient Prescriptions  Medication Sig Dispense Refill  . acetaminophen (TYLENOL) 500 MG tablet Take 1,000 mg by mouth every 6 (six) hours as needed for pain.       [name] ALPRAZolam (XANAX) 0.25 MG tablet Take 0.25 mg by mouth 2 (two) times daily as needed for anxiety. For anxiety      . aspirin EC 81 MG tablet Take 81 mg by mouth daily.      [name] atenolol (TENORMIN) 25 MG tablet Take 12.5 mg by mouth daily.       [name] docusate sodium (COLACE) 100 MG capsule Take 200 mg by mouth 2 (two) times daily.      . methimazole (TAPAZOLE) 10 MG tablet Take 20 mg by mouth daily.      . pantoprazole (PROTONIX) 40 MG tablet Take 40 mg by mouth daily.      . traMADol (ULTRAM) 50 MG tablet Take 50 mg by mouth 2 (two) times  daily as needed for pain.       . fluticasone (FLONASE) 50 MCG/ACT nasal spray Place 2 sprays into the nose daily.        No current facility-administered medications for this visit.      Physical Exam:   BP 114/63  Pulse 96  Resp 20  Ht 5\' 4"  (1.626 m)  Wt 111 lb (50.349 kg)  BMI 19.04 kg/m2  SpO2 99%  General:  Thin but well-appearing  Chest:   Clear to auscultation  CV:   Regular rate and rhythm with frequent ectopic beats, no murmur appreciated  Incisions:  Completely healed  Abdomen:  Soft and nontender  Extremities:  Warm and well-perfused  Diagnostic Tests:  2 channel telemetry rhythm strip demonstrates normal sinus rhythm with frequent PVCs   Impression:  The patient is clinically doing well now 6 months following minimally invasive mitral valve repair and Maze procedure. She is maintaining sinus rhythm with frequent PVCs.  Plan:  The patient will return in 6 months for rhythm check.   Salvatore Decent. Cornelius Moras, MD 01/08/2013 1:32 PM

## 2013-02-10 ENCOUNTER — Encounter (HOSPITAL_COMMUNITY): Payer: Self-pay | Admitting: Emergency Medicine

## 2013-02-10 ENCOUNTER — Emergency Department (HOSPITAL_COMMUNITY)
Admission: EM | Admit: 2013-02-10 | Discharge: 2013-02-10 | Disposition: A | Discharge status: Home / Self Care | Payer: Medicare Other | Attending: Emergency Medicine | Admitting: Emergency Medicine

## 2013-02-10 ENCOUNTER — Emergency Department (HOSPITAL_COMMUNITY): Payer: Medicare Other

## 2013-02-10 DIAGNOSIS — K5900 Constipation, unspecified: Secondary | ICD-10-CM | POA: Insufficient documentation

## 2013-02-10 DIAGNOSIS — I509 Heart failure, unspecified: Secondary | ICD-10-CM | POA: Insufficient documentation

## 2013-02-10 DIAGNOSIS — Z862 Personal history of diseases of the blood and blood-forming organs and certain disorders involving the immune mechanism: Secondary | ICD-10-CM | POA: Insufficient documentation

## 2013-02-10 DIAGNOSIS — Z8701 Personal history of pneumonia (recurrent): Secondary | ICD-10-CM | POA: Insufficient documentation

## 2013-02-10 DIAGNOSIS — IMO0002 Reserved for concepts with insufficient information to code with codable children: Secondary | ICD-10-CM | POA: Insufficient documentation

## 2013-02-10 DIAGNOSIS — Z8639 Personal history of other endocrine, nutritional and metabolic disease: Secondary | ICD-10-CM | POA: Insufficient documentation

## 2013-02-10 DIAGNOSIS — R1110 Vomiting, unspecified: Secondary | ICD-10-CM | POA: Insufficient documentation

## 2013-02-10 DIAGNOSIS — R011 Cardiac murmur, unspecified: Secondary | ICD-10-CM | POA: Insufficient documentation

## 2013-02-10 DIAGNOSIS — I2510 Atherosclerotic heart disease of native coronary artery without angina pectoris: Secondary | ICD-10-CM | POA: Insufficient documentation

## 2013-02-10 DIAGNOSIS — K219 Gastro-esophageal reflux disease without esophagitis: Secondary | ICD-10-CM | POA: Insufficient documentation

## 2013-02-10 DIAGNOSIS — Z951 Presence of aortocoronary bypass graft: Secondary | ICD-10-CM | POA: Insufficient documentation

## 2013-02-10 DIAGNOSIS — Z7982 Long term (current) use of aspirin: Secondary | ICD-10-CM | POA: Insufficient documentation

## 2013-02-10 DIAGNOSIS — I10 Essential (primary) hypertension: Secondary | ICD-10-CM | POA: Insufficient documentation

## 2013-02-10 DIAGNOSIS — Z79899 Other long term (current) drug therapy: Secondary | ICD-10-CM | POA: Insufficient documentation

## 2013-02-10 DIAGNOSIS — F411 Generalized anxiety disorder: Secondary | ICD-10-CM | POA: Insufficient documentation

## 2013-02-10 LAB — URINALYSIS, ROUTINE W REFLEX MICROSCOPIC
Bilirubin Urine: NEGATIVE
Glucose, UA: NEGATIVE mg/dL
Ketones, ur: NEGATIVE mg/dL
Leukocytes, UA: NEGATIVE
Nitrite: NEGATIVE
Protein, ur: NEGATIVE mg/dL
Urobilinogen, UA: 0.2 mg/dL (ref 0.0–1.0)

## 2013-02-10 LAB — HEPATIC FUNCTION PANEL
AST: 20 U/L (ref 0–37)
Albumin: 4 g/dL (ref 3.5–5.2)
Indirect Bilirubin: 0.5 mg/dL (ref 0.3–0.9)
Total Bilirubin: 0.6 mg/dL (ref 0.3–1.2)
Total Protein: 7.5 g/dL (ref 6.0–8.3)

## 2013-02-10 LAB — LACTIC ACID, PLASMA: Lactic Acid, Venous: 0.9 mmol/L (ref 0.5–2.2)

## 2013-02-10 LAB — URINE MICROSCOPIC-ADD ON

## 2013-02-10 LAB — LIPASE, BLOOD: Lipase: 24 U/L (ref 11–59)

## 2013-02-10 LAB — BASIC METABOLIC PANEL
CO2: 28 mEq/L (ref 19–32)
Calcium: 10.2 mg/dL (ref 8.4–10.5)
Creatinine, Ser: 0.66 mg/dL (ref 0.50–1.10)
GFR calc Af Amer: >90 mL/min (ref >90)
Glucose, Bld: 109 mg/dL — ABNORMAL HIGH (ref 70–99)
Potassium: 4.1 mEq/L (ref 3.5–5.1)
Sodium: 130 mEq/L — ABNORMAL LOW (ref 135–145)

## 2013-02-10 LAB — CBC
MCH: 28.9 pg (ref 26.0–34.0)
MCV: 84.1 fL (ref 78.0–100.0)
Platelets: 269 10*3/uL (ref 150–400)
RBC: 4.53 MIL/uL (ref 3.87–5.11)
RDW: 15.8 % — ABNORMAL HIGH (ref 11.5–15.5)
WBC: 16 10*3/uL — ABNORMAL HIGH (ref 4.0–10.5)

## 2013-02-10 MED ORDER — MORPHINE SULFATE 4 MG/ML IJ SOLN
4.0000 mg | Freq: Once | INTRAMUSCULAR | Status: AC
  Administered 2013-02-10: 4 mg via INTRAVENOUS
  Filled 2013-02-10: qty 1

## 2013-02-10 MED ORDER — ONDANSETRON HCL 4 MG/2ML IJ SOLN
4.0000 mg | Freq: Once | INTRAMUSCULAR | Status: AC
  Administered 2013-02-10: 4 mg via INTRAVENOUS
  Filled 2013-02-10: qty 2

## 2013-02-10 MED ORDER — LORAZEPAM 1 MG PO TABS
1.0000 mg | ORAL_TABLET | Freq: Once | ORAL | Status: AC
  Administered 2013-02-10: 1 mg via ORAL
  Filled 2013-02-10: qty 1

## 2013-02-10 MED ORDER — IOHEXOL 300 MG/ML  SOLN
80.0000 mL | Freq: Once | INTRAMUSCULAR | Status: AC | PRN
  Administered 2013-02-10: 80 mL via INTRAVENOUS

## 2013-02-10 MED ORDER — SODIUM CHLORIDE 0.9 % IV BOLUS (SEPSIS)
1000.0000 mL | Freq: Once | INTRAVENOUS | Status: AC
  Administered 2013-02-10: 1000 mL via INTRAVENOUS

## 2013-02-10 MED ORDER — FLEET ENEMA 7-19 GM/118ML RE ENEM
1.0000 | ENEMA | Freq: Once | RECTAL | Status: AC
  Administered 2013-02-10: 1 via RECTAL
  Filled 2013-02-10: qty 1

## 2013-02-10 MED ORDER — IOHEXOL 300 MG/ML  SOLN: 50.0000 mL | Freq: Once | INTRAMUSCULAR | Status: DC | PRN

## 2013-02-10 NOTE — ED Notes (Signed)
Pt arrives to ed c/o abd pain in LLQ.  Pt sts emesis of "bile" x1 this am, no blood,also sts Last BM this AM no blood. Pt denies recent illness/injury.  Caox4, pmsx4, nad.

## 2013-02-10 NOTE — ED Provider Notes (Signed)
CSN: [id number]     Arrival date & time 02/10/13  1201 History   First MD Initiated Contact with Patient 02/10/13 1204     Chief Complaint  Patient presents with  . Abdominal Pain   (Consider location/radiation/quality/duration/timing/severity/associated sxs/prior Treatment) Patient is a 72 y.o. female presenting with abdominal pain. The history is provided by the patient.  Abdominal Pain Pain location:  LLQ and suprapubic Pain quality: aching and sharp   Pain radiates to:  Does not radiate Pain severity:  Moderate Onset quality:  Sudden Timing:  Constant Progression:  Worsening Chronicity:  Recurrent Context: not recent sexual activity, not sick contacts and not trauma   Relieved by:  Nothing Worsened by:  Nothing tried Ineffective treatments:  Bowel activity Associated symptoms: constipation and vomiting (bilious x1)   Associated symptoms: no cough, no dysuria, no fatigue, no fever, no hematemesis, no hematochezia, no hematuria, no nausea, no shortness of breath, no vaginal bleeding and no vaginal discharge     Past Medical History  Diagnosis Date  . Hypertension     Then hypotension 04/2012 limiting med adjustment.  . Diverticulitis   . Anxiety   . Mitral valve prolapse     a. H/o MVP, with severe MR 04/2012, had MV repair and Maze  . Hyperlipidemia     recently- taken off Crestor- due to pain in her legs, but this was after cardiac cath. , pt. questioning whether the pain in her legs was related to cath. or crestor  . Goiter   . GERD (gastroesophageal reflux disease)   . Aortic stenosis 05/19/2012    trivial by TEE  . Atrial fibrillation     a. Dx 04/2012;  b. Amio d/c'd 2/2 hyperthyroidism;  c. Atrial flutter 10/2012;  d. On xarelto.  . Refusal of blood transfusions as patient is Jehovah's Witness   . Pneumonia     treated /w antibiotic- [facility]- early Feb. 2014  . S/P Maze operation for atrial fibrillation 06/28/2012    Complete bilateral atrial lesion set using cryothermy  ablation with oversewing of LA appendage via right minithoracotomy  . S/P mitral valve repair 06/28/2012    Complex valvuloplasty including quadrangular resection of posterior leaflet, sliding leaflet plasty, artificial Gore-tex neocord placement x2 and 32mm Sorin Memo 3D ring annuloplasty via right mini thoracotomy   . 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 Microscopic hematuria 08/20/2012  . Shortness of breath   . Heart murmur   . CHF (congestive heart failure)   . Anemia   . Coronary artery disease    Past Surgical History  Procedure Laterality Date  . Uterine fibroid surgery  1988  . Ovarian cyst surgery  1988  . Tubal ligation  1974  . Tee without cardioversion  05/19/2012    Procedure: TRANSESOPHAGEAL ECHOCARDIOGRAM (TEE);  Surgeon: Dolores Patty, MD;  Location: [facility] ENDOSCOPY;  Service: Cardiovascular;  Laterality: N/A;  . Mitral valve repair Right 06/28/2012    Procedure: MINIMALLY INVASIVE MITRAL VALVE REPAIR (MVR);  Surgeon: Purcell Nails, MD;  Location: [facility] OR;  Service: Open Heart Surgery;  Laterality: Right;  . Minimally invasive maze procedure N/A 06/28/2012    Procedure: MINIMALLY INVASIVE MAZE PROCEDURE;  Surgeon: Purcell Nails, MD;  Location: [facility] OR;  Service: Open Heart Surgery;  Laterality: N/A;  . Intraoperative transesophageal echocardiogram N/A 06/28/2012    Procedure: INTRAOPERATIVE TRANSESOPHAGEAL ECHOCARDIOGRAM;  Surgeon: Purcell Nails, MD;  Location: [facility] OR;  Service: Open Heart Surgery;  Laterality: N/A;  . Urethral stricture  dilatation    . Coronary artery bypass graft     Family History  Problem Relation Age of Onset  . Colon cancer Father    History  Substance Use Topics  . Smoking status: Never Smoker   . Smokeless tobacco: Never Used  . Alcohol Use: No     Comment: 1 drink a month   OB History   Grav Para Term Preterm Abortions TAB SAB Ect Mult Living                 Review of Systems  Constitutional: Negative for fever and fatigue.   Respiratory: Negative for cough and shortness of breath.   Gastrointestinal: Positive for vomiting (bilious x1), abdominal pain and constipation. Negative for nausea, hematochezia and hematemesis.  Genitourinary: Negative for dysuria, hematuria, vaginal bleeding and vaginal discharge.  All other systems reviewed and are negative.    Allergies  Amiodarone; Diltiazem; Sulfa drugs cross reactors; Aspirin; and Naproxen sodium  Home Medications   Current Outpatient Rx  Name  Route  Sig  Dispense  Refill  . acetaminophen (TYLENOL) 500 MG tablet   Oral   Take 1,000 mg by mouth every 6 (six) hours as needed for pain.          [name] ALPRAZolam (XANAX) 0.25 MG tablet   Oral   Take 0.25 mg by mouth 2 (two) times daily as needed for anxiety. For anxiety         . amoxicillin (AMOXIL) 500 MG capsule   Oral   Take 2,000 mg by mouth once as needed (Before going to dentist.).         [name] aspirin EC 81 MG tablet   Oral   Take 81 mg by mouth daily.         [name] atenolol (TENORMIN) 25 MG tablet   Oral   Take 12.5 mg by mouth daily.          [name] docusate sodium (COLACE) 100 MG capsule   Oral   Take 200 mg by mouth 2 (two) times daily.         . fluticasone (FLONASE) 50 MCG/ACT nasal spray   Nasal   Place 2 sprays into the nose daily as needed for allergies.          . methimazole (TAPAZOLE) 10 MG tablet   Oral   Take 20 mg by mouth daily.         . pantoprazole (PROTONIX) 40 MG tablet   Oral   Take 40 mg by mouth 2 (two) times daily.          . traMADol (ULTRAM) 50 MG tablet   Oral   Take 50 mg by mouth 2 (two) times daily as needed for pain.           BP 117/50  Pulse 105  Temp(Src) 98.5 F (36.9 C) (Oral)  Resp 18  Ht 5\' 6"  (1.676 m)  Wt 107 lb (48.535 kg)  BMI 17.28 kg/m2  SpO2 99% Physical Exam  Nursing note and vitals reviewed. Constitutional: She is oriented to person, place, and time. She appears well-developed and well-nourished. No distress.  HENT:   Head: Normocephalic and atraumatic.  Eyes: EOM are normal. Pupils are equal, round, and reactive to light.  Neck: Normal range of motion. Neck supple.  Cardiovascular: Normal rate and regular rhythm.  Exam reveals no friction rub.   No murmur heard. Pulmonary/Chest: Effort normal and breath sounds normal. No respiratory distress. She has no  wheezes. She has no rales.  Abdominal: Soft. She exhibits no distension. There is tenderness (LLQ). There is guarding (LLQ). There is no rebound.  Musculoskeletal: Normal range of motion. She exhibits no edema.  Neurological: She is alert and oriented to person, place, and time. No cranial nerve deficit. She exhibits normal muscle tone. Coordination normal.  Skin: She is not diaphoretic.    ED Course  Procedures (including critical care time) Labs Review Labs Reviewed  CBC - Abnormal; Notable for the following:    WBC 16.0 (*)    RDW 15.8 (*)    All other components within normal limits  BASIC METABOLIC PANEL - Abnormal; Notable for the following:    Sodium 130 (*)    Chloride 94 (*)    Glucose, Bld 109 (*)    GFR calc non Af Amer 87 (*)    All other components within normal limits  URINALYSIS, ROUTINE W REFLEX MICROSCOPIC - Abnormal; Notable for the following:    Hgb urine dipstick TRACE (*)    All other components within normal limits  HEPATIC FUNCTION PANEL  LIPASE, BLOOD  LACTIC ACID, PLASMA  URINE MICROSCOPIC-ADD ON   Imaging Review Ct Abdomen Pelvis W Contrast  02/10/2013   CLINICAL DATA:  Left lower quadrant pain  EXAM: CT ABDOMEN AND PELVIS WITH CONTRAST  TECHNIQUE: Multidetector CT imaging of the abdomen and pelvis was performed using the standard protocol following bolus administration of intravenous contrast.  CONTRAST:  80mL OMNIPAQUE IOHEXOL 300 MG/ML  SOLN  COMPARISON:  08/20/2012  FINDINGS: Liver, gallbladder, spleen, pancreas, adrenal glands are within normal limits.  Stable large a benign appearing cysts in the lower pole of  the right kidney. Stable simple cyst in the lower pole of the left kidney.  Prominent stool burden throughout the length of the colon. Diverticulosis of the sigmoid colon without obvious acute diverticulitis.  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 Uterus is within normal limits.  Mild degenerative change of the lumbar spine. No vertebral compression deformity  The bladder is distended.  IMPRESSION: Prominent stool burden throughout the colon.  Prominent left ovarian vein leading to the left pelvic varices. This can be associated with pelvic congestion syndrome.  Bladder is distended.   Electronically Signed   By: Maryclare Bean M.D.   On: 02/10/2013 15:59    EKG Interpretation   None       MDM   1. Constipation    72 year old female presents with left lower quadrant pain. She had a bilious episode of emesis once this morning. There is no hematemesis. She had a bowel movement this morning when she went to urinate with small stool but did not alleviate her pain. There is no blood in her stool. She denies any fevers or diarrhea. She does have some associated tenesmus. Here her vitals are stable. She has some focal left lower quadrant pain with guarding on exam. She has no fecal impaction on rectal exam. I am concerned for diverticulitis we will CT her abdomen and obtain labs. Pain medicine and fluids given. CT shows large stool burden. She does not have any diverticulitis. Urine is negative. She does have mild leukocytosis at [employer]. [employer] other source concerning for her infection. She's given an of large bowel movement and relief of her pain. She stable for discharge. She's instructed use enema when necessary and continue taking 200 mg of docusate daily. I have reviewed all labs and imaging and considered them in my medical decision  making.   Dagmar Hait, MD 02/10/13 [phone]

## 2013-02-10 NOTE — ED Notes (Signed)
Pt given very detailed instruction of drinking contrast.

## 2013-07-09 ENCOUNTER — Ambulatory Visit: Payer: Self-pay | Admitting: Thoracic Surgery (Cardiothoracic Vascular Surgery)

## 2013-07-23 ENCOUNTER — Ambulatory Visit (INDEPENDENT_AMBULATORY_CARE_PROVIDER_SITE_OTHER): Payer: Medicare Other | Admitting: Thoracic Surgery (Cardiothoracic Vascular Surgery)

## 2013-07-23 ENCOUNTER — Encounter: Payer: Self-pay | Admitting: Thoracic Surgery (Cardiothoracic Vascular Surgery)

## 2013-07-23 VITALS — BP 130/69 | HR 84 | Resp 20 | Ht 66.0 in | Wt 114.0 lb

## 2013-07-23 DIAGNOSIS — Z8679 Personal history of other diseases of the circulatory system: Secondary | ICD-10-CM

## 2013-07-23 DIAGNOSIS — Z9889 Other specified postprocedural states: Secondary | ICD-10-CM

## 2013-07-23 DIAGNOSIS — I059 Rheumatic mitral valve disease, unspecified: Secondary | ICD-10-CM

## 2013-07-23 DIAGNOSIS — I340 Nonrheumatic mitral (valve) insufficiency: Secondary | ICD-10-CM

## 2013-07-23 NOTE — Progress Notes (Signed)
      [address]       [name] [address]             [phone]     CARDIOTHORACIC SURGERY OFFICE NOTE  Primary Cardiologist is Georga Hacking, MD PCP is Minda Meo, MD   HPI:  Patient returns for routine followup and rhythm check status post minimally invasive mitral valve repair and Maze procedure on 06/28/2012. She developed thyrotoxicosis on amiodarone therapy for which she has been followed carefully by Dr. [name]. She was last seen here in our office on 01/08/2013.  Since then she has continued to do well from a cardiovascular standpoint. She denies any problems with exertional shortness of breath.    Current Outpatient Prescriptions  Medication Sig Dispense Refill  . acetaminophen (TYLENOL) 500 MG tablet Take 1,000 mg by mouth every 6 (six) hours as needed for pain.       [name] ALPRAZolam (XANAX) 0.25 MG tablet Take 0.25 mg by mouth 2 (two) times daily as needed for anxiety. For anxiety      . amoxicillin (AMOXIL) 500 MG capsule Take 2,000 mg by mouth once as needed (Before going to dentist.).      [name] aspirin EC 81 MG tablet Take 81 mg by mouth daily.      [name] atenolol (TENORMIN) 25 MG tablet Take 12.5 mg by mouth daily.       [name] docusate sodium (COLACE) 100 MG capsule Take 200 mg by mouth 2 (two) times daily.      . fluticasone (FLONASE) 50 MCG/ACT nasal spray Place 2 sprays into the nose daily as needed for allergies.       . methimazole (TAPAZOLE) 10 MG tablet Take 20 mg by mouth daily.      . pantoprazole (PROTONIX) 40 MG tablet Take 40 mg by mouth 2 (two) times daily.       . traMADol (ULTRAM) 50 MG tablet Take 50 mg by mouth 2 (two) times daily as needed for pain.        No current facility-administered medications for this visit.      Physical Exam:   BP 130/69  Pulse 84  Resp 20  Ht 5\' 6"  (1.676 m)  Wt 114 lb (51.71 kg)  BMI 18.41 kg/m2  SpO2 98%  General:  Well-appearing  Chest:   Clear to auscultation  CV:   Regular rate and  rhythm without murmur  Incisions:  Completely healed  Abdomen:  Soft and nontender  Extremities:  Warm and well-perfused  Diagnostic Tests:  2 channel telemetry rhythm strip demonstrates normal sinus rhythm with occasional PVC   Impression:  The patient continues to do well from a cardiovascular standpoint 1 year status post minimally invasive mitral valve repair and Maze procedure. She is maintaining sinus rhythm with frequent PVCs.  Plan:  The patient will return in one year for routine followup and rhythm check.   Salvatore Decent. Cornelius Moras, MD 07/23/2013 11:58 AM

## 2013-07-23 NOTE — Patient Instructions (Signed)

## 2014-04-04 ENCOUNTER — Encounter (HOSPITAL_COMMUNITY): Payer: Self-pay | Admitting: Cardiovascular Disease

## 2014-07-01 ENCOUNTER — Ambulatory Visit: Payer: Self-pay | Admitting: Thoracic Surgery (Cardiothoracic Vascular Surgery)

## 2014-07-02 IMAGING — CR DG CHEST 2V
2 series · 2 of 2 positions shown · non-contrast
Comparison: PA and lateral chest 05/21/2012 and CT chest
07/07/2010.

CLINICAL DATA: Mitral valve prolapse.

CHEST - 2 VIEW

[view not recorded (1 of 2)]
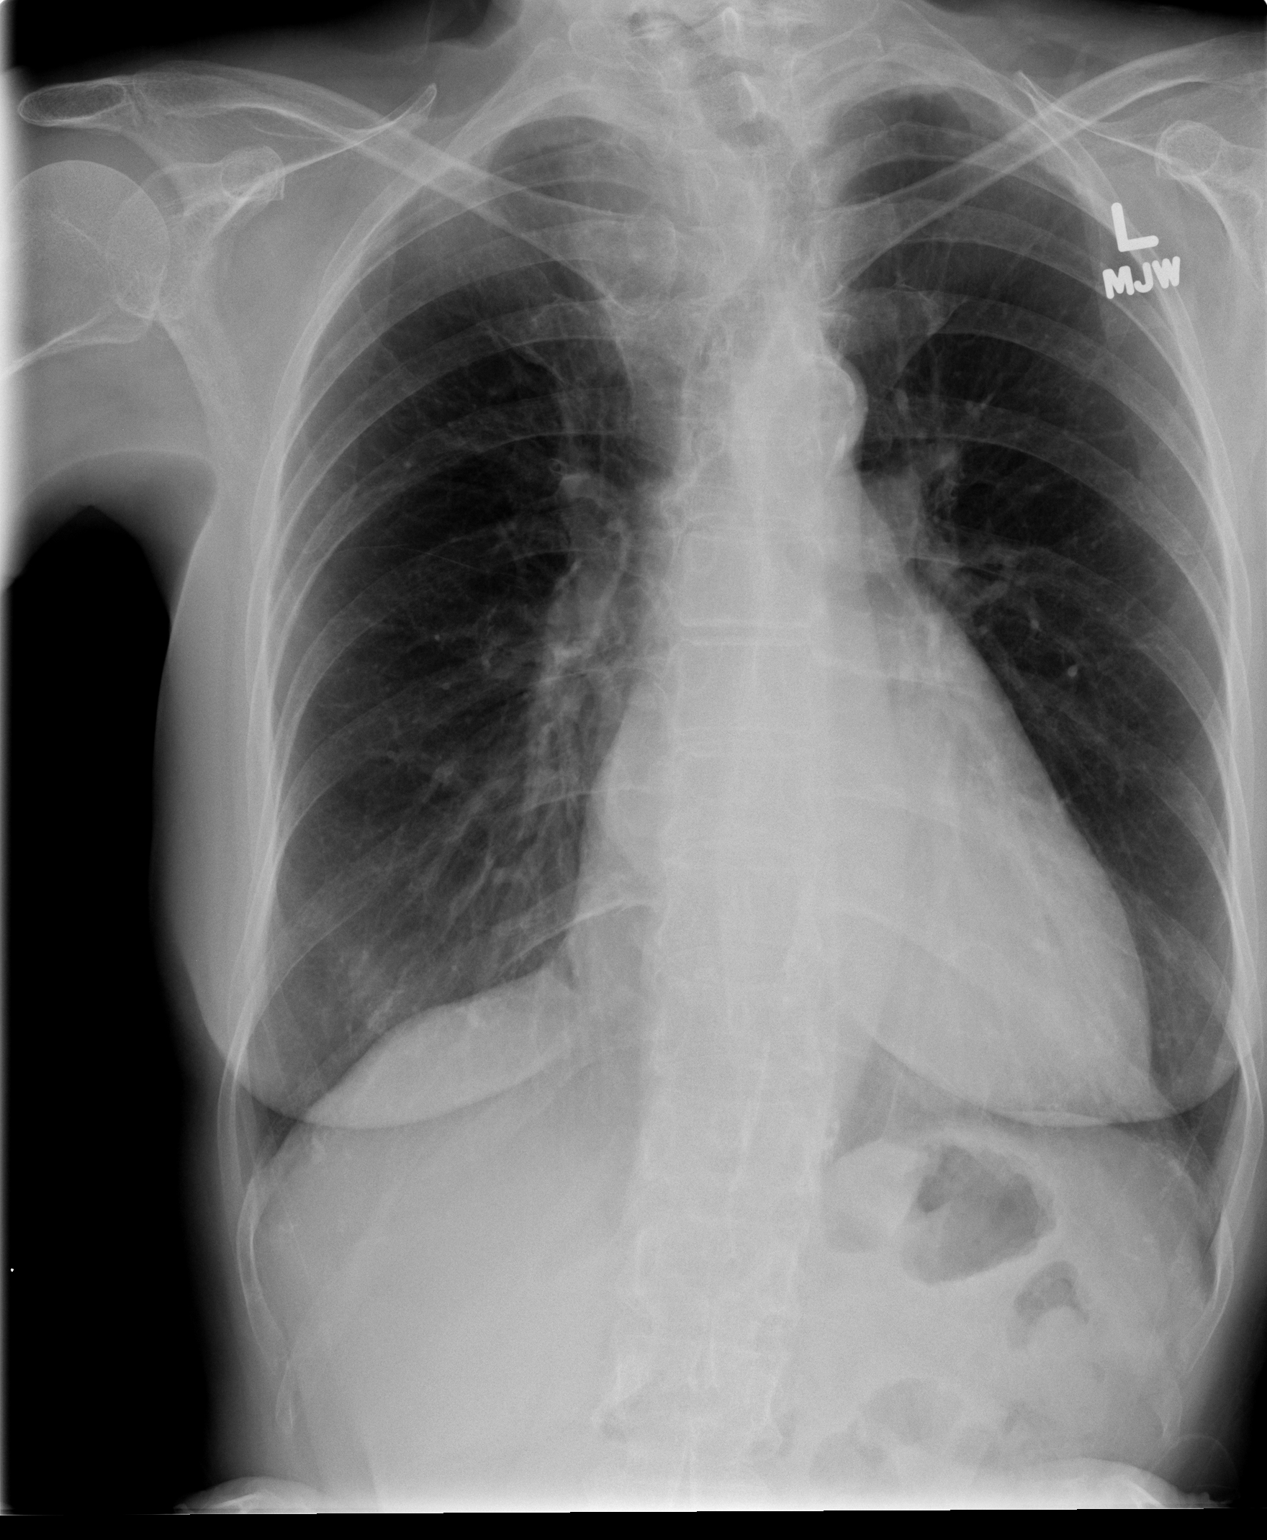

[view not recorded (2 of 2)]
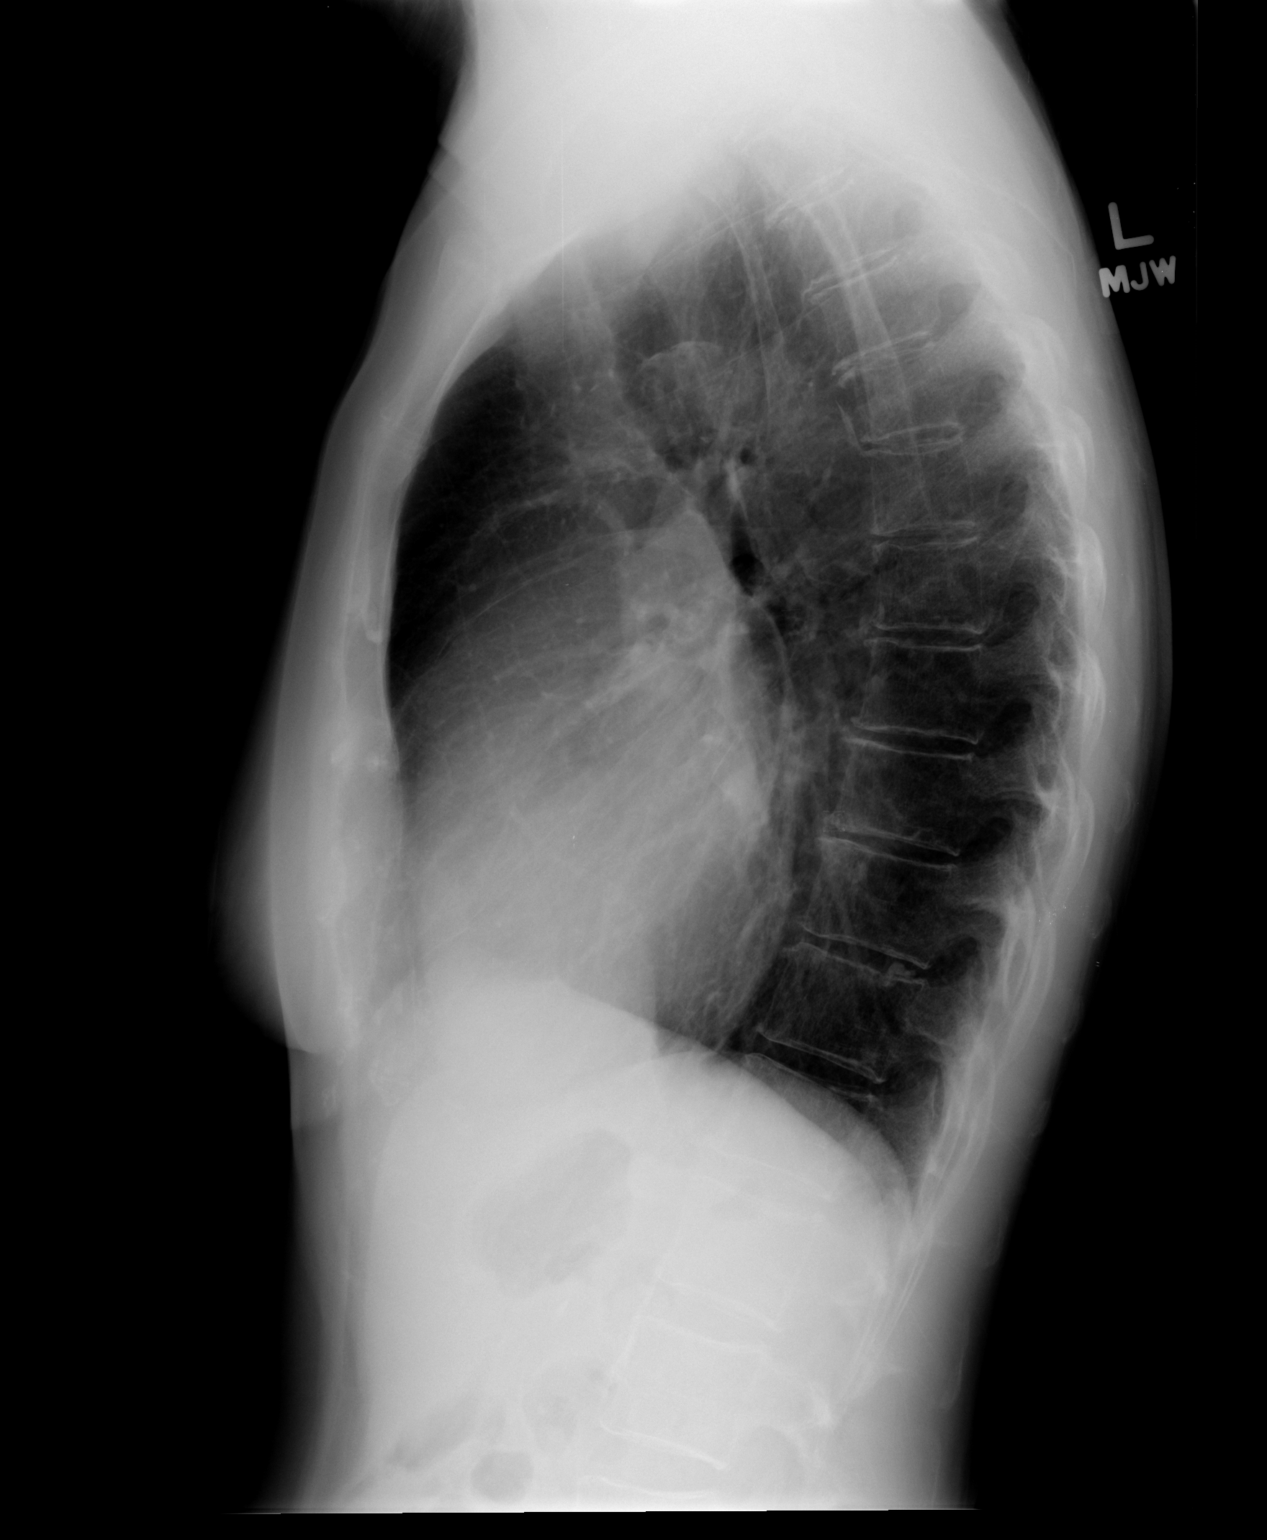

[2 of 2 positions shown; findings below may reference images not displayed]

FINDINGS: There is cardiomegaly without pulmonary edema.  Lungs are
clear.  No pneumothorax or pleural effusion.  Marked tracheal
deviation to the left is consistent with enlargement of the right
lobe of the thyroid gland as seen on the comparison CT.
IMPRESSION: Cardiomegaly without acute disease.

## 2014-07-08 ENCOUNTER — Ambulatory Visit (INDEPENDENT_AMBULATORY_CARE_PROVIDER_SITE_OTHER): Payer: Medicare HMO | Admitting: Thoracic Surgery (Cardiothoracic Vascular Surgery)

## 2014-07-08 ENCOUNTER — Encounter: Payer: Self-pay | Admitting: Thoracic Surgery (Cardiothoracic Vascular Surgery)

## 2014-07-08 VITALS — BP 155/81 | HR 75 | Resp 16 | Ht 66.5 in | Wt 123.0 lb

## 2014-07-08 DIAGNOSIS — I340 Nonrheumatic mitral (valve) insufficiency: Secondary | ICD-10-CM

## 2014-07-08 DIAGNOSIS — Z9889 Other specified postprocedural states: Secondary | ICD-10-CM

## 2014-07-08 DIAGNOSIS — Z8679 Personal history of other diseases of the circulatory system: Secondary | ICD-10-CM

## 2014-07-08 NOTE — Progress Notes (Signed)
[address]       [name] [address]             [phone]     CARDIOTHORACIC SURGERY OFFICE NOTE  Referring Provider is Kathleene Hazel* MD Primary Cardiologist is Georga Hacking, MD PCP is Minda Meo, MD   HPI:  Patient returns for routine followup and rhythm check approximately 2 years status post minimally invasive mitral valve repair and Maze procedure on 06/28/2012. She was last seen here in the office on 07/23/2013. Since then she has apparently remained stable from a cardiovascular standpoint. She was seen recently by Dr. Donnie Aho and reportedly started on a new medication for hypertension, although the patient cannot recall the name of the medicine and she did not bring her list of medications with her to the office today. She thinks that Dr. Donnie Aho has done a follow-up echocardiogram, but she is uncertain when it was performed or what the results showed.  She denies any symptoms of exertional shortness of breath. She continues to exercise on a regular basis at [employer]. She has not had palpitations or dizzy spells. She states that she remains intolerant of most medications, except for the Xanax she uses for anxiety. She continues to follow-up regularly with Dr. Jacky [name] for management of her hyperthyroidism.   Current Outpatient Prescriptions  Medication Sig Dispense Refill  . acetaminophen (TYLENOL) 500 MG tablet Take 1,000 mg by mouth every 6 (six) hours as needed for pain.     [name] ALPRAZolam (XANAX) 0.25 MG tablet Take 0.25 mg by mouth 2 (two) times daily as needed for anxiety. For anxiety    . amoxicillin (AMOXIL) 500 MG capsule Take 2,000 mg by mouth once as needed (Before going to dentist.).    [name] aspirin EC 81 MG tablet Take 81 mg by mouth daily.    [name] atenolol (TENORMIN) 25 MG tablet Take 12.5 mg by mouth daily.     [name] docusate sodium (COLACE) 100 MG capsule Take 200 mg by mouth 2 (two) times daily.    . fluticasone (FLONASE) 50 MCG/ACT  nasal spray Place 2 sprays into the nose daily as needed for allergies.     . methimazole (TAPAZOLE) 5 MG tablet Take 5 mg by mouth daily.    . pantoprazole (PROTONIX) 40 MG tablet Take 40 mg by mouth 2 (two) times daily.     . traMADol (ULTRAM) 50 MG tablet Take 50 mg by mouth 2 (two) times daily as needed for pain.      No current facility-administered medications for this visit.      Physical Exam:   BP 155/81 mmHg  Pulse 75  Resp 16  Ht 5' 6.5" (1.689 m)  Wt 123 lb (55.792 kg)  BMI 19.56 kg/m2  SpO2 99%  General:  Well-appearing  Chest:   Clear to auscultation  CV:   Regular rate and rhythm without murmur  Incisions:  Completely healed  Abdomen:  Soft and nontender  Extremities:  Warm and well-perfused  Diagnostic Tests:  2 channel telemetry rhythm strip demonstrates normal sinus rhythm   Impression:  Patient remains clinically stable from a cardiovascular standpoint approximately 2 years status post minimally invasive mitral valve repair and Maze procedure. She is maintaining sinus rhythm.   Plan:  The patient will continue to follow-up with Dr. Donnie Aho and Dr. Jacky [name]. She will be referred to the atrial fibrillation clinic for long-term surveillance following maze procedure.  She has been reminded regarding  the life long need for antibiotic prophylaxis for all dental cleaning and related procedures.  I spent in excess of 15 minutes during the conduct of this office consultation and >50% of this time involved direct face-to-face encounter with the patient for counseling and/or coordination of their care.   Salvatore Decent. Cornelius Moras, MD 07/08/2014 11:11 AM

## 2014-07-08 NOTE — Patient Instructions (Signed)

## 2014-09-06 IMAGING — US US ABDOMEN COMPLETE
1 series · 14 of 25 positions shown · non-contrast
Comparison: CT abdomen and pelvis 04/24/2011.

CLINICAL DATA: Reflux, indigestion, and back pain.

COMPLETE ABDOMINAL ULTRASOUND

[Series 1: us abdomen complete · 0.32mm/px · 14 of 77 slices shown]
[im 1/77]
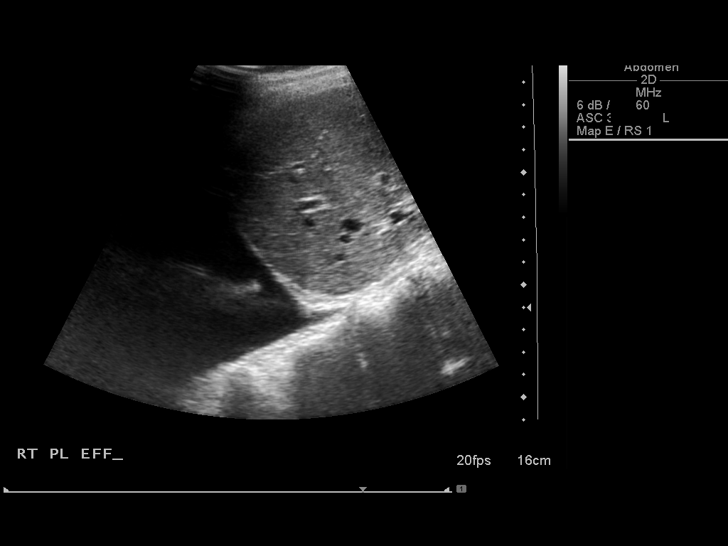
[im 7/77]
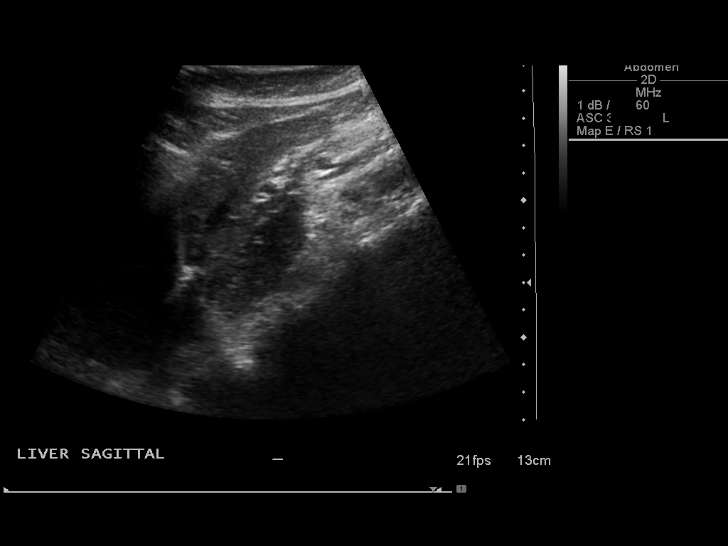
[im 13/77]
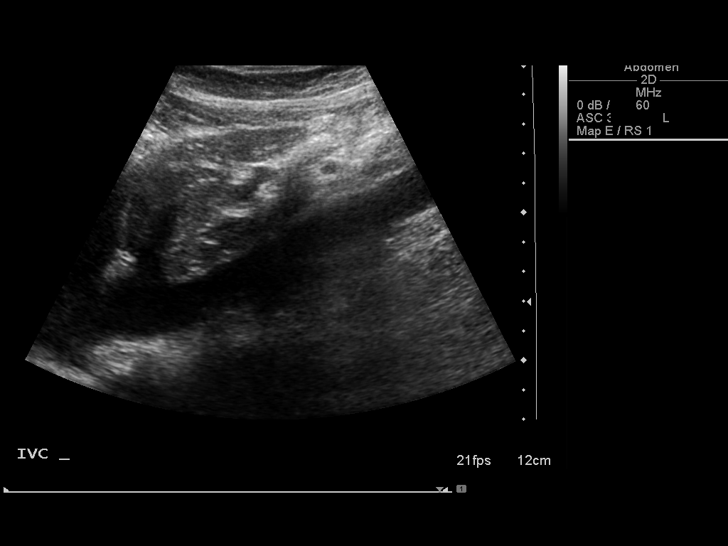
[im 20/77]
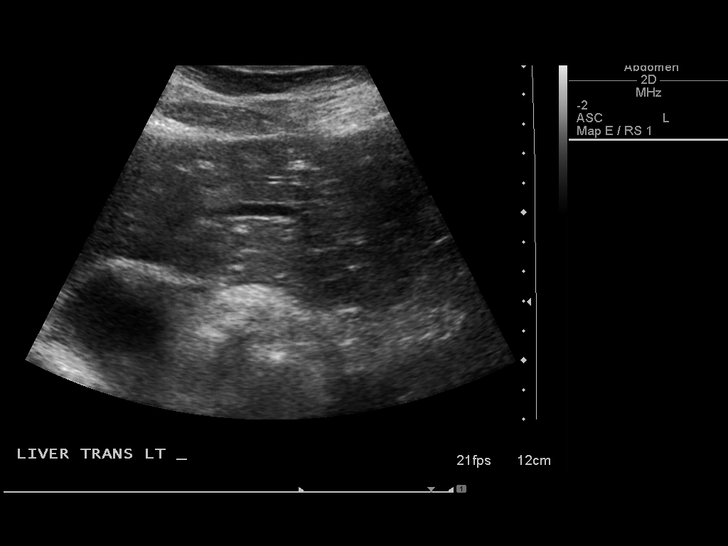
[im 26/77]
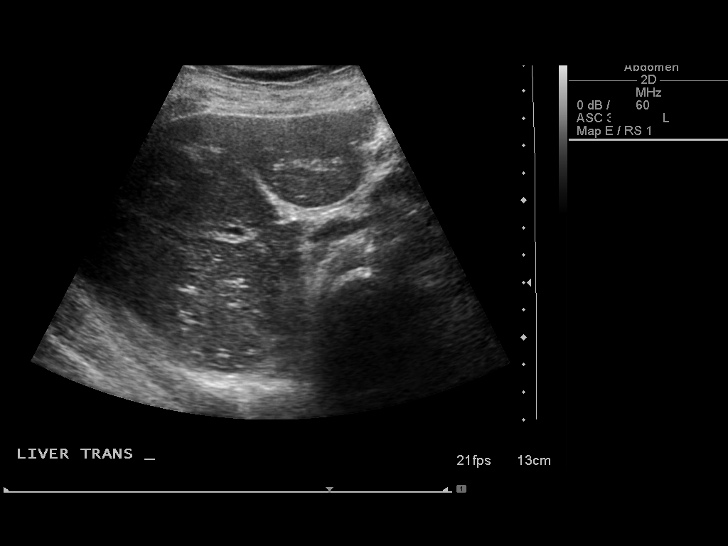
[im 29/77]
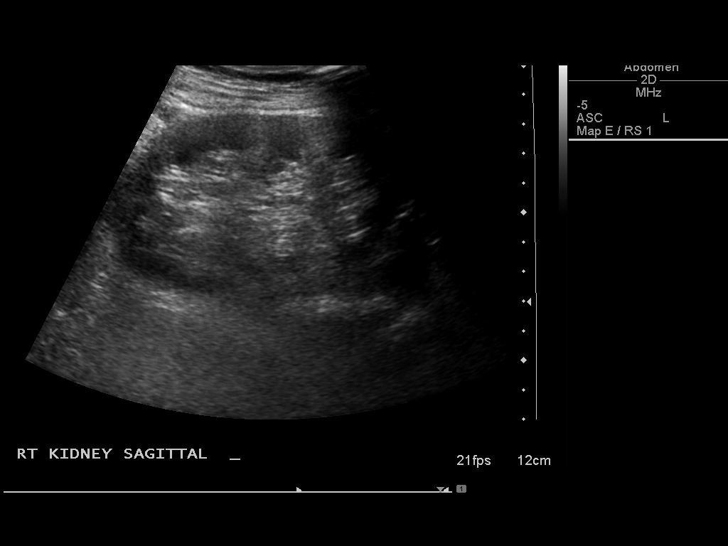
[im 35/77]
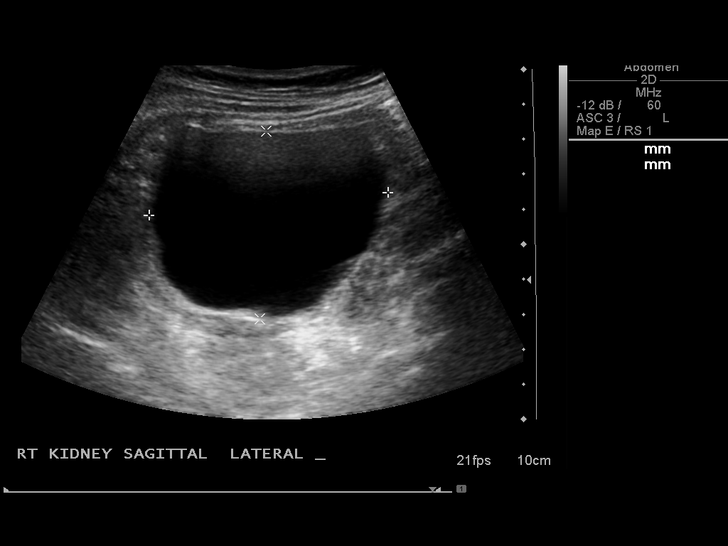
[im 42/77]
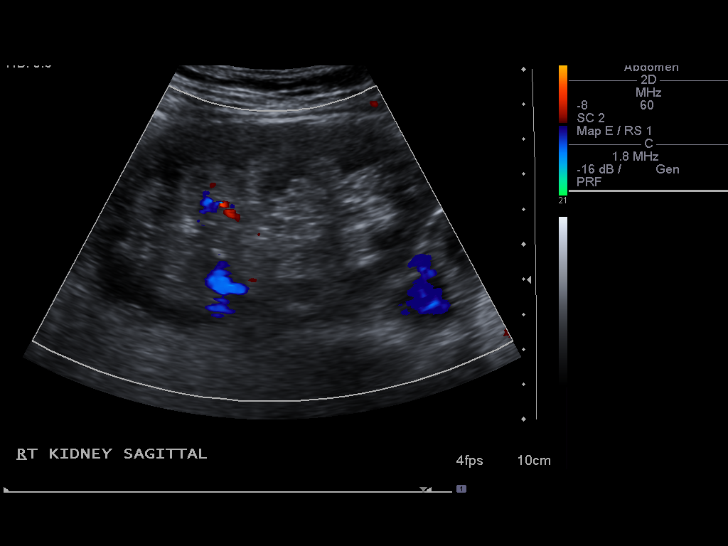
[im 48/77]
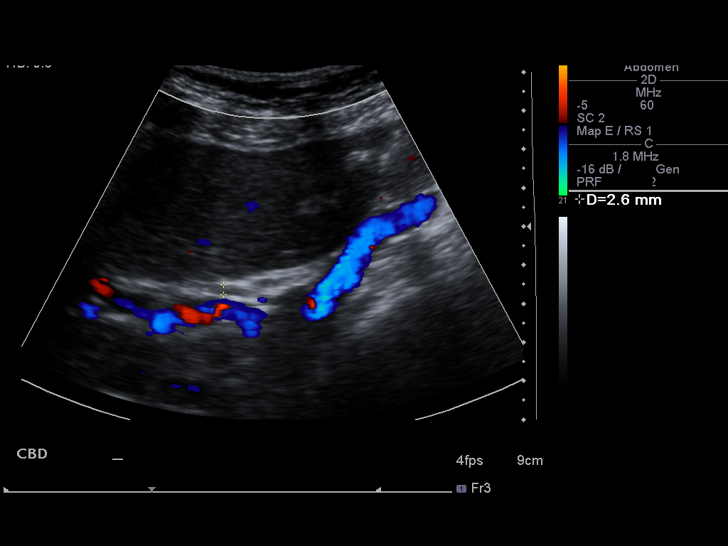
[im 51/77]
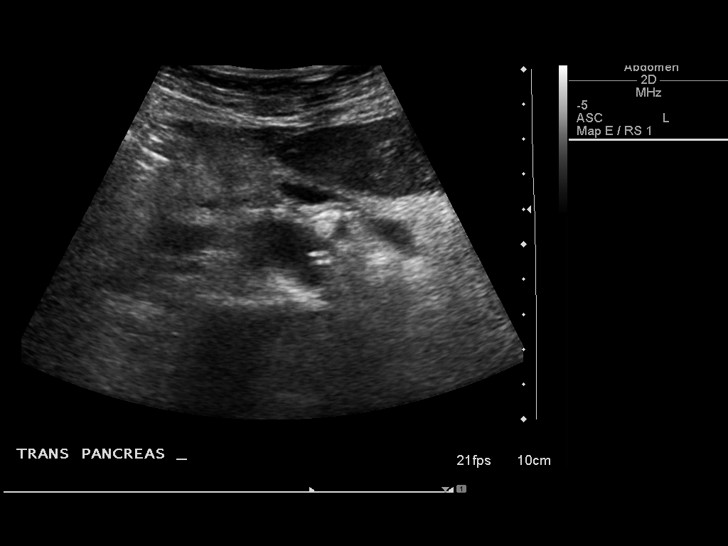
[im 58/77]
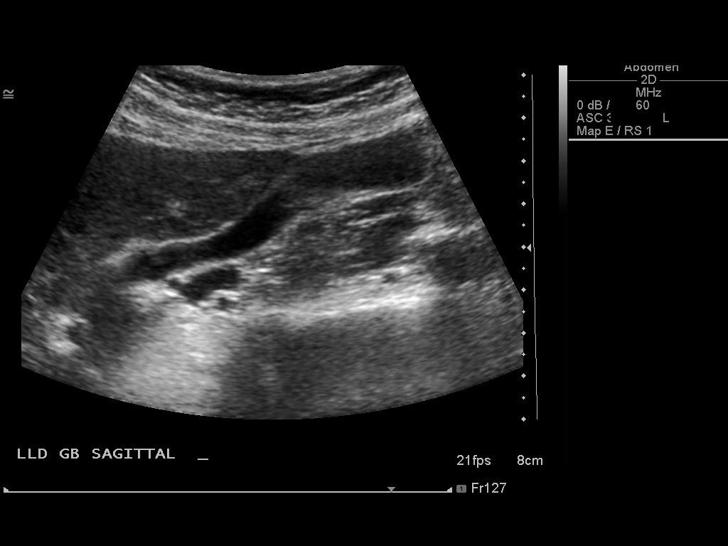
[im 64/77]
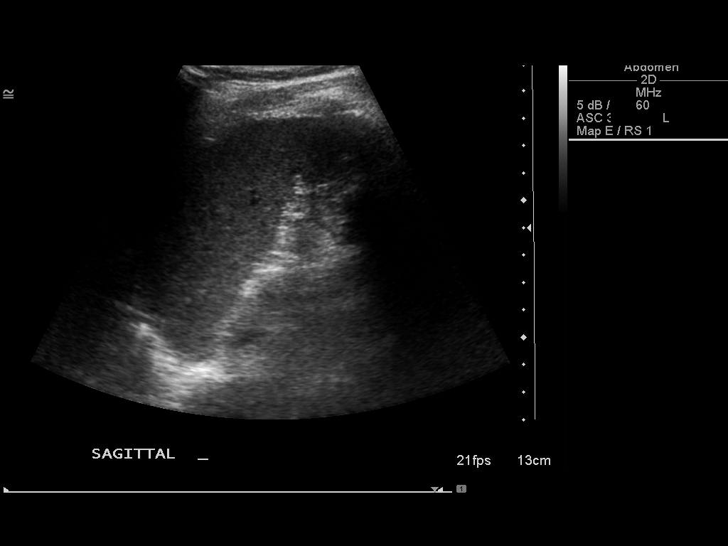
[im 70/77]
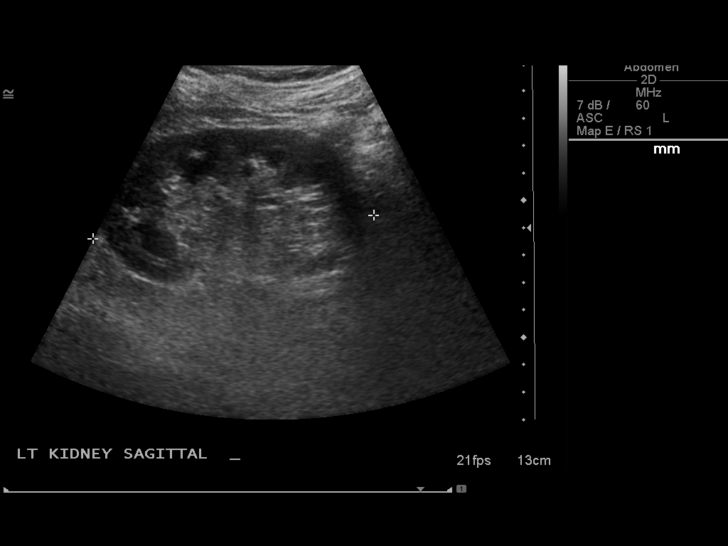
[im 77/77]
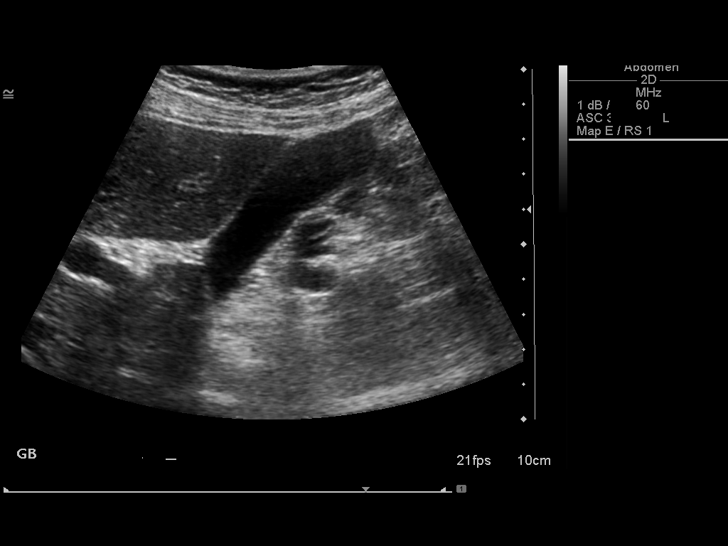

[14 of 25 positions shown; findings below may reference images not displayed]

FINDINGS: Gallbladder:  No gallstones, gallbladder wall thickening, or
pericholecystic fluid.

Common bile duct:  Normal caliber with diameter measured at 2.6 mm.

Liver:  No focal lesion identified.  Within normal limits in
parenchymal echogenicity.

IVC:  Appears normal.

Pancreas:  No focal abnormality seen.

Spleen:  Spleen length measures 6.3 cm.  Normal parenchymal
echotexture.

Right Kidney:  Right kidney measures 10.8 cm length.  No
hydronephrosis.  There is a simple appearing cyst measuring up to
7.7 cm diameter.  This was present on the previous CT scan.

Left Kidney:  Left kidney measures left kidney measures 10.6 cm
length.  No hydronephrosis.

Abdominal aorta:  No aneurysm identified.

A small right pleural effusion is incidentally demonstrated.
IMPRESSION: Small right pleural effusion. Stable large right renal cyst.  No
acute findings demonstrated in the abdomen or pelvis.

## 2014-09-22 ENCOUNTER — Encounter (HOSPITAL_COMMUNITY): Payer: Self-pay

## 2014-09-22 ENCOUNTER — Ambulatory Visit (INDEPENDENT_AMBULATORY_CARE_PROVIDER_SITE_OTHER): Payer: Medicare HMO | Admitting: Family Medicine

## 2014-09-22 ENCOUNTER — Emergency Department (HOSPITAL_COMMUNITY)
Admission: EM | Admit: 2014-09-22 | Discharge: 2014-09-23 | Disposition: A | Discharge status: Home / Self Care | Payer: Medicare HMO | Attending: Emergency Medicine | Admitting: Emergency Medicine

## 2014-09-22 ENCOUNTER — Ambulatory Visit (INDEPENDENT_AMBULATORY_CARE_PROVIDER_SITE_OTHER): Payer: Medicare HMO

## 2014-09-22 ENCOUNTER — Emergency Department (HOSPITAL_COMMUNITY): Payer: Medicare HMO

## 2014-09-22 VITALS — BP 110/70 | HR 89 | Temp 98.2°F | Resp 16 | Wt 121.0 lb

## 2014-09-22 DIAGNOSIS — R1084 Generalized abdominal pain: Secondary | ICD-10-CM

## 2014-09-22 DIAGNOSIS — Z7982 Long term (current) use of aspirin: Secondary | ICD-10-CM | POA: Insufficient documentation

## 2014-09-22 DIAGNOSIS — I10 Essential (primary) hypertension: Secondary | ICD-10-CM | POA: Diagnosis not present

## 2014-09-22 DIAGNOSIS — R109 Unspecified abdominal pain: Secondary | ICD-10-CM | POA: Diagnosis present

## 2014-09-22 DIAGNOSIS — R55 Syncope and collapse: Secondary | ICD-10-CM

## 2014-09-22 DIAGNOSIS — N390 Urinary tract infection, site not specified: Secondary | ICD-10-CM | POA: Insufficient documentation

## 2014-09-22 DIAGNOSIS — R9431 Abnormal electrocardiogram [ECG] [EKG]: Secondary | ICD-10-CM | POA: Diagnosis not present

## 2014-09-22 DIAGNOSIS — Z8679 Personal history of other diseases of the circulatory system: Secondary | ICD-10-CM

## 2014-09-22 DIAGNOSIS — K219 Gastro-esophageal reflux disease without esophagitis: Secondary | ICD-10-CM | POA: Diagnosis not present

## 2014-09-22 DIAGNOSIS — R42 Dizziness and giddiness: Secondary | ICD-10-CM

## 2014-09-22 DIAGNOSIS — Z862 Personal history of diseases of the blood and blood-forming organs and certain disorders involving the immune mechanism: Secondary | ICD-10-CM | POA: Diagnosis not present

## 2014-09-22 DIAGNOSIS — R011 Cardiac murmur, unspecified: Secondary | ICD-10-CM | POA: Diagnosis not present

## 2014-09-22 DIAGNOSIS — Z9889 Other specified postprocedural states: Secondary | ICD-10-CM

## 2014-09-22 DIAGNOSIS — F419 Anxiety disorder, unspecified: Secondary | ICD-10-CM | POA: Insufficient documentation

## 2014-09-22 DIAGNOSIS — Z8701 Personal history of pneumonia (recurrent): Secondary | ICD-10-CM | POA: Insufficient documentation

## 2014-09-22 DIAGNOSIS — R319 Hematuria, unspecified: Secondary | ICD-10-CM | POA: Diagnosis not present

## 2014-09-22 DIAGNOSIS — E049 Nontoxic goiter, unspecified: Secondary | ICD-10-CM | POA: Insufficient documentation

## 2014-09-22 DIAGNOSIS — Z79899 Other long term (current) drug therapy: Secondary | ICD-10-CM | POA: Insufficient documentation

## 2014-09-22 DIAGNOSIS — I509 Heart failure, unspecified: Secondary | ICD-10-CM | POA: Insufficient documentation

## 2014-09-22 DIAGNOSIS — I2510 Atherosclerotic heart disease of native coronary artery without angina pectoris: Secondary | ICD-10-CM | POA: Insufficient documentation

## 2014-09-22 DIAGNOSIS — I4891 Unspecified atrial fibrillation: Secondary | ICD-10-CM | POA: Insufficient documentation

## 2014-09-22 DIAGNOSIS — Z951 Presence of aortocoronary bypass graft: Secondary | ICD-10-CM | POA: Diagnosis not present

## 2014-09-22 LAB — POCT URINALYSIS DIPSTICK
Bilirubin, UA: NEGATIVE
GLUCOSE UA: NEGATIVE
Ketones, UA: NEGATIVE
Nitrite, UA: NEGATIVE
Protein, UA: NEGATIVE
Spec Grav, UA: 1.01
Urobilinogen, UA: 0.2
pH, UA: 6.5

## 2014-09-22 LAB — HEPATIC FUNCTION PANEL
ALBUMIN: 4.1 g/dL (ref 3.5–5.0)
ALT: 18 U/L (ref 14–54)
AST: 24 U/L (ref 15–41)
Alkaline Phosphatase: 75 U/L (ref 38–126)
Bilirubin, Direct: 0.1 mg/dL (ref 0.1–0.5)
Indirect Bilirubin: 0.9 mg/dL (ref 0.3–0.9)
Total Bilirubin: 1 mg/dL (ref 0.3–1.2)
Total Protein: 7.2 g/dL (ref 6.5–8.1)

## 2014-09-22 LAB — POCT UA - MICROSCOPIC ONLY
Casts, Ur, LPF, POC: NEGATIVE
Crystals, Ur, HPF, POC: NEGATIVE
MUCUS UA: NEGATIVE
Yeast, UA: NEGATIVE

## 2014-09-22 LAB — POCT CBC
Granulocyte percent: 65 %G (ref 37–80)
HCT, POC: 40.9 % (ref 37.7–47.9)
Hemoglobin: 13.5 g/dL (ref 12.2–16.2)
LYMPH, POC: 2.6 (ref 0.6–3.4)
MCH: 28.5 pg (ref 27–31.2)
MCHC: 32.9 g/dL (ref 31.8–35.4)
MCV: 86.5 fL (ref 80–97)
MID (cbc): 0.8 (ref 0–0.9)
MPV: 7.3 fL (ref 0–99.8)
POC Granulocyte: 6.2 (ref 2–6.9)
POC LYMPH %: 27.1 % (ref 10–50)
POC MID %: 7.9 % (ref 0–12)
Platelet Count, POC: 272 10*3/uL (ref 142–424)
RBC: 4.73 M/uL (ref 4.04–5.48)
RDW, POC: 13.3 %
WBC: 9.6 10*3/uL (ref 4.6–10.2)

## 2014-09-22 LAB — I-STAT TROPONIN, ED: Troponin i, poc: 0 ng/mL (ref 0.00–0.08)

## 2014-09-22 LAB — GLUCOSE, POCT (MANUAL RESULT ENTRY): POC GLUCOSE: 96 mg/dL (ref 70–99)

## 2014-09-22 LAB — LIPASE, BLOOD: Lipase: 27 U/L (ref 22–51)

## 2014-09-22 LAB — BASIC METABOLIC PANEL
Anion gap: 9 (ref 5–15)
BUN: 11 mg/dL (ref 6–20)
CO2: 27 mmol/L (ref 22–32)
CREATININE: 0.82 mg/dL (ref 0.44–1.00)
Calcium: 10.1 mg/dL (ref 8.9–10.3)
Chloride: 97 mmol/L — ABNORMAL LOW (ref 101–111)
Glucose, Bld: 104 mg/dL — ABNORMAL HIGH (ref 65–99)
POTASSIUM: 4 mmol/L (ref 3.5–5.1)
Sodium: 133 mmol/L — ABNORMAL LOW (ref 135–145)

## 2014-09-22 MED ORDER — TRAMADOL HCL 50 MG PO TABS: 50.0000 mg | ORAL_TABLET | Freq: Two times a day (BID) | ORAL | Status: DC | PRN

## 2014-09-22 MED ORDER — MORPHINE SULFATE 4 MG/ML IJ SOLN
4.0000 mg | Freq: Once | INTRAMUSCULAR | Status: AC
  Administered 2014-09-22: 4 mg via INTRAVENOUS
  Filled 2014-09-22: qty 1

## 2014-09-22 MED ORDER — IOHEXOL 300 MG/ML  SOLN: 25.0000 mL | Freq: Once | INTRAMUSCULAR | Status: AC | PRN

## 2014-09-22 MED ORDER — ONDANSETRON HCL 4 MG/2ML IJ SOLN
4.0000 mg | Freq: Once | INTRAMUSCULAR | Status: AC
  Administered 2014-09-22: 4 mg via INTRAVENOUS
  Filled 2014-09-22: qty 2

## 2014-09-22 MED ORDER — IOHEXOL 300 MG/ML  SOLN
100.0000 mL | Freq: Once | INTRAMUSCULAR | Status: AC | PRN
  Administered 2014-09-22: 100 mL via INTRAVENOUS

## 2014-09-22 MED ORDER — SODIUM CHLORIDE 0.9 % IV SOLN
INTRAVENOUS | Status: DC
  Administered 2014-09-22: 19:00:00 via INTRAVENOUS

## 2014-09-22 MED ORDER — CIPROFLOXACIN HCL 250 MG PO TABS: 250.0000 mg | ORAL_TABLET | Freq: Two times a day (BID) | ORAL | Status: DC

## 2014-09-22 MED ORDER — CIPROFLOXACIN HCL 500 MG PO TABS
250.0000 mg | ORAL_TABLET | Freq: Once | ORAL | Status: AC
  Administered 2014-09-22: 250 mg via ORAL
  Filled 2014-09-22: qty 1

## 2014-09-22 NOTE — ED Notes (Signed)
Dr. Knapp at the bedside.  

## 2014-09-22 NOTE — ED Notes (Signed)
Pt denies having any abd pain at this time, only that she has had some dizziness that comes and goes.

## 2014-09-22 NOTE — Discharge Instructions (Signed)
Abdominal Pain, Women °Abdominal (stomach, pelvic, or belly) pain can be caused by many things. It is important to tell your doctor: °· The location of the pain. °· Does it come and go or is it present all the time? °· Are there things that start the pain (eating certain foods, exercise)? °· Are there other symptoms associated with the pain (fever, nausea, vomiting, diarrhea)? °All of this is helpful to know when trying to find the cause of the pain. °CAUSES  °· Stomach: virus or bacteria infection, or ulcer. °· Intestine: appendicitis (inflamed appendix), regional ileitis (Crohn's disease), ulcerative colitis (inflamed colon), irritable bowel syndrome, diverticulitis (inflamed diverticulum of the colon), or cancer of the stomach or intestine. °· Gallbladder disease or stones in the gallbladder. °· Kidney disease, kidney stones, or infection. °· Pancreas infection or cancer. °· Fibromyalgia (pain disorder). °· Diseases of the female organs: °· Uterus: fibroid (non-cancerous) tumors or infection. °· Fallopian tubes: infection or tubal pregnancy. °· Ovary: cysts or tumors. °· Pelvic adhesions (scar tissue). °· Endometriosis (uterus lining tissue growing in the pelvis and on the pelvic organs). °· Pelvic congestion syndrome (female organs filling up with blood just before the menstrual period). °· Pain with the menstrual period. °· Pain with ovulation (producing an egg). °· Pain with an IUD (intrauterine device, birth control) in the uterus. °· Cancer of the female organs. °· Functional pain (pain not caused by a disease, may improve without treatment). °· Psychological pain. °· Depression. °DIAGNOSIS  °Your doctor will decide the seriousness of your pain by doing an examination. °· Blood tests. °· X-rays. °· Ultrasound. °· CT scan (computed tomography, special type of X-ray). °· MRI (magnetic resonance imaging). °· Cultures, for infection. °· Barium enema (dye inserted in the large intestine, to better view it with  X-rays). °· Colonoscopy (looking in intestine with a lighted tube). °· Laparoscopy (minor surgery, looking in abdomen with a lighted tube). °· Major abdominal exploratory surgery (looking in abdomen with a large incision). °TREATMENT  °The treatment will depend on the cause of the pain.  °· Many cases can be observed and treated at home. °· Over-the-counter medicines recommended by your caregiver. °· Prescription medicine. °· Antibiotics, for infection. °· Birth control pills, for painful periods or for ovulation pain. °· Hormone treatment, for endometriosis. °· Nerve blocking injections. °· Physical therapy. °· Antidepressants. °· Counseling with a psychologist or psychiatrist. °· Minor or major surgery. °HOME CARE INSTRUCTIONS  °· Do not take laxatives, unless directed by your caregiver. °· Take over-the-counter pain medicine only if ordered by your caregiver. Do not take aspirin because it can cause an upset stomach or bleeding. °· Try a clear liquid diet (broth or water) as ordered by your caregiver. Slowly move to a bland diet, as tolerated, if the pain is related to the stomach or intestine. °· Have a thermometer and take your temperature several times a day, and record it. °· Bed rest and sleep, if it helps the pain. °· Avoid sexual intercourse, if it causes pain. °· Avoid stressful situations. °· Keep your follow-up appointments and tests, as your caregiver orders. °· If the pain does not go away with medicine or surgery, you may try: °¨ Acupuncture. °¨ Relaxation exercises (yoga, meditation). °¨ Group therapy. °¨ Counseling. °SEEK MEDICAL CARE IF:  °· You notice certain foods cause stomach pain. °· Your home care treatment is not helping your pain. °· You need stronger pain medicine. °· You want your IUD removed. °· You feel faint or   lightheaded. °· You develop nausea and vomiting. °· You develop a rash. °· You are having side effects or an allergy to your medicine. °SEEK IMMEDIATE MEDICAL CARE IF:  °· Your  pain does not go away or gets worse. °· You have a fever. °· Your pain is felt only in portions of the abdomen. The right side could possibly be appendicitis. The left lower portion of the abdomen could be colitis or diverticulitis. °· You are passing blood in your stools (bright red or black tarry stools, with or without vomiting). °· You have blood in your urine. °· You develop chills, with or without a fever. °· You pass out. °MAKE SURE YOU:  °· Understand these instructions. °· Will watch your condition. °· Will get help right away if you are not doing well or get worse. °Document Released: 02/07/2007 Document Revised: 08/27/2013 Document Reviewed: 02/27/2009 °ExitCare® Patient Information ©2015 [employer]. This information is not intended to replace advice given to you by your health care provider. Make sure you discuss any questions you have with your health care provider. ° °Urinary Tract Infection °Urinary tract infections (UTIs) can develop anywhere along your urinary tract. Your urinary tract is your body's drainage system for removing wastes and extra water. Your urinary tract includes two kidneys, two ureters, a bladder, and a urethra. Your kidneys are a pair of bean-shaped organs. Each kidney is about the size of your fist. They are located below your ribs, one on each side of your spine. °CAUSES °Infections are caused by microbes, which are microscopic organisms, including fungi, viruses, and bacteria. These organisms are so small that they can only be seen through a microscope. Bacteria are the microbes that most commonly cause UTIs. °SYMPTOMS  °Symptoms of UTIs may vary by age and gender of the patient and by the location of the infection. Symptoms in young women typically include a frequent and intense urge to urinate and a painful, burning feeling in the bladder or urethra during urination. Older women and men are more likely to be tired, shaky, and weak and have muscle aches and abdominal pain.  A fever may mean the infection is in your kidneys. Other symptoms of a kidney infection include pain in your back or sides below the ribs, nausea, and vomiting. °DIAGNOSIS °To diagnose a UTI, your caregiver will ask you about your symptoms. Your caregiver also will ask to provide a urine sample. The urine sample will be tested for bacteria and white blood cells. White blood cells are made by your body to help fight infection. °TREATMENT  °Typically, UTIs can be treated with medication. Because most UTIs are caused by a bacterial infection, they usually can be treated with the use of antibiotics. The choice of antibiotic and length of treatment depend on your symptoms and the type of bacteria causing your infection. °HOME CARE INSTRUCTIONS °· If you were prescribed antibiotics, take them exactly as your caregiver instructs you. Finish the medication even if you feel better after you have only taken some of the medication. °· Drink enough water and fluids to keep your urine clear or pale yellow. °· Avoid caffeine, tea, and carbonated beverages. They tend to irritate your bladder. °· Empty your bladder often. Avoid holding urine for long periods of time. °· Empty your bladder before and after sexual intercourse. °· After a bowel movement, women should cleanse from front to back. Use each tissue only once. °SEEK MEDICAL CARE IF:  °· You have back pain. °· You develop   a fever. °· Your symptoms do not begin to resolve within 3 days. °SEEK IMMEDIATE MEDICAL CARE IF:  °· You have severe back pain or lower abdominal pain. °· You develop chills. °· You have nausea or vomiting. °· You have continued burning or discomfort with urination. °MAKE SURE YOU:  °· Understand these instructions. °· Will watch your condition. °· Will get help right away if you are not doing well or get worse. °Document Released: 01/20/2005 Document Revised: 10/12/2011 Document Reviewed: 05/21/2011 °ExitCare® Patient Information ©2015 [employer]. This  information is not intended to replace advice given to you by your health care provider. Make sure you discuss any questions you have with your health care provider. ° °

## 2014-09-22 NOTE — ED Notes (Signed)
PER EMS: pt transferred from Urgent care for abnormal EKG findings. Originally she went to urgent care due to lower abdominal pain and constipation that started on Friday associated wit lightheadedness and dizziness. She informed the MD at [facility] that she was dizzy and lightheaded so he ran an EKG and it showed PACs and ST depression. Pt denies CP, SOB, no diaphoresis. Only complaint is lower abdominal pain. A&OX4. BP-126/67, HR-81, CBG-93. States MD also informed her of blood in her urine.

## 2014-09-22 NOTE — Patient Instructions (Addendum)
Due to your dizzy episodes and episode of almost passing out, along with possible abnormal ekg and heart history - it is best if you are evaluated further through the emergency room.   Additionally with your abdominal pain that is worsening, and blood in urine - they may do some other testing in the emergency room.   Follow up here if needed, but your primary provider may follow up with you next week.

## 2014-09-22 NOTE — ED Provider Notes (Signed)
CSN: [id number]     Arrival date & time 09/22/14  1820 History   First MD Initiated Contact with Patient 09/22/14 1833     Chief Complaint  Patient presents with  . Abnormal ECG  . Abdominal Pain    HPI Patient presents to the emergency room with complaints of abdominal pain that started about a week and a half ago. Said pain in the lower abdomen primarily on the right side. She's had some trouble with decreased appetite and constipation. She has tried over-the-counter laxities but it has not helped much.  She denies any trouble with urination. While at the urgent care she had an episode where she felt dizzy and lightheaded. Physician performed an EKG and was concerned about the findings and sent her to the emergency room. Patient denies any trouble with chest pain or shortness of breath. No diaphoresis. Past Medical History  Diagnosis Date  . Hypertension     Then hypotension 04/2012 limiting med adjustment.  . Diverticulitis   . Anxiety   . Mitral valve prolapse     a. H/o MVP, with severe MR 04/2012, had MV repair and Maze  . Hyperlipidemia     recently- taken off Crestor- due to pain in her legs, but this was after cardiac cath. , pt. questioning whether the pain in her legs was related to cath. or crestor  . Goiter   . GERD (gastroesophageal reflux disease)   . Aortic stenosis 05/19/2012    trivial by TEE  . Atrial fibrillation     a. Dx 04/2012;  b. Amio d/c'd 2/2 hyperthyroidism;  c. Atrial flutter 10/2012;  d. On xarelto.  . Refusal of blood transfusions as patient is Jehovah's Witness   . Pneumonia     treated /w antibiotic- [facility]- early Feb. 2014  . S/P Maze operation for atrial fibrillation 06/28/2012    Complete bilateral atrial lesion set using cryothermy ablation with oversewing of LA appendage via right minithoracotomy  . S/P mitral valve repair 06/28/2012    Complex valvuloplasty including quadrangular resection of posterior leaflet, sliding leaflet plasty, artificial Gore-tex  neocord placement x2 and 32mm Sorin Memo 3D ring annuloplasty via right mini thoracotomy   . 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 Microscopic hematuria 08/20/2012  . Shortness of breath   . Heart murmur   . CHF (congestive heart failure)   . Anemia   . Coronary artery disease    Past Surgical History  Procedure Laterality Date  . Uterine fibroid surgery  1988  . Ovarian cyst surgery  1988  . Tubal ligation  1974  . Tee without cardioversion  05/19/2012    Procedure: TRANSESOPHAGEAL ECHOCARDIOGRAM (TEE);  Surgeon: Dolores Patty, MD;  Location: [facility] ENDOSCOPY;  Service: Cardiovascular;  Laterality: N/A;  . Mitral valve repair Right 06/28/2012    Procedure: MINIMALLY INVASIVE MITRAL VALVE REPAIR (MVR);  Surgeon: Purcell Nails, MD;  Location: [facility] OR;  Service: Open Heart Surgery;  Laterality: Right;  . Minimally invasive maze procedure N/A 06/28/2012    Procedure: MINIMALLY INVASIVE MAZE PROCEDURE;  Surgeon: Purcell Nails, MD;  Location: [facility] OR;  Service: Open Heart Surgery;  Laterality: N/A;  . Intraoperative transesophageal echocardiogram N/A 06/28/2012    Procedure: INTRAOPERATIVE TRANSESOPHAGEAL ECHOCARDIOGRAM;  Surgeon: Purcell Nails, MD;  Location: [facility] OR;  Service: Open Heart Surgery;  Laterality: N/A;  . Urethral stricture dilatation    . Coronary artery bypass graft    . Left and right heart catheterization with coronary angiogram  N/A 05/22/2012    Procedure: LEFT AND RIGHT HEART CATHETERIZATION WITH CORONARY ANGIOGRAM;  Surgeon: Kathleene Hazel, MD;  Location: [facility] CATH LAB;  Service: Cardiovascular;  Laterality: N/A;   Family History  Problem Relation Age of Onset  . Colon cancer Father    History  Substance Use Topics  . Smoking status: Never Smoker   . Smokeless tobacco: Never Used  . Alcohol Use: No     Comment: 1 drink a month   OB History    No data available     Review of Systems  All other systems reviewed and are negative.     Allergies   Amiodarone; Diltiazem; Sulfa drugs cross reactors; Aspirin; and Naproxen sodium  Home Medications   Prior to Admission medications   Medication Sig Start Date End Date Taking? Authorizing Provider  acetaminophen (TYLENOL) 500 MG tablet Take 1,000 mg by mouth every 6 (six) hours as needed for pain.     Historical Provider, MD  ALPRAZolam Prudy Feeler) 0.25 MG tablet Take 0.25 mg by mouth 2 (two) times daily as needed for anxiety. For anxiety    Historical Provider, MD  aspirin EC 81 MG tablet Take 81 mg by mouth daily.    Historical Provider, MD  atenolol (TENORMIN) 25 MG tablet Take 12.5 mg by mouth daily.  11/24/12   Historical Provider, MD  fluticasone (FLONASE) 50 MCG/ACT nasal spray Place 2 sprays into the nose daily as needed for allergies.  12/08/12   Historical Provider, MD  methimazole (TAPAZOLE) 5 MG tablet Take 5 mg by mouth daily.    Historical Provider, MD  pantoprazole (PROTONIX) 40 MG tablet Take 40 mg by mouth 2 (two) times daily.  08/24/12   Geoffry Paradise, MD  traMADol (ULTRAM) 50 MG tablet Take 50 mg by mouth 2 (two) times daily as needed for pain.  07/05/12   Erin R Barrett, PA-C   BP 131/61 mmHg  Pulse 82  Temp(Src) 97.9 F (36.6 C) (Oral)  Resp 13  SpO2 100% Physical Exam  Constitutional: She appears well-developed and well-nourished. No distress.  HENT:  Head: Normocephalic and atraumatic.  Right Ear: External ear normal.  Left Ear: External ear normal.  Eyes: Conjunctivae are normal. Right eye exhibits no discharge. Left eye exhibits no discharge. No scleral icterus.  Neck: Neck supple. No tracheal deviation present.  Cardiovascular: Normal rate, regular rhythm and intact distal pulses.   Pulmonary/Chest: Effort normal and breath sounds normal. No stridor. No respiratory distress. She has no wheezes. She has no rales.  Abdominal: Soft. Bowel sounds are normal. She exhibits no distension. There is tenderness in the right lower quadrant. There is no rebound and no guarding.   Musculoskeletal: She exhibits no edema or tenderness.  Neurological: She is alert. She has normal strength. No cranial nerve deficit (no facial droop, extraocular movements intact, no slurred speech) or sensory deficit. She exhibits normal muscle tone. She displays no seizure activity. Coordination normal.  Skin: Skin is warm and dry. No rash noted.  Psychiatric: She has a normal mood and affect.  Nursing note and vitals reviewed.   ED Course  Procedures (including critical care time) Labs Review Labs Reviewed  BASIC METABOLIC PANEL - Abnormal; Notable for the following:    Sodium 133 (*)    Chloride 97 (*)    Glucose, Bld 104 (*)    All other components within normal limits  LIPASE, BLOOD  HEPATIC FUNCTION PANEL  I-STAT TROPOININ, ED    Imaging Review Ct  Abdomen Pelvis W Contrast  09/22/2014   CLINICAL DATA:  Acute onset of lower abdominal pain and constipation. Initial encounter.  EXAM: CT ABDOMEN AND PELVIS WITH CONTRAST  TECHNIQUE: Multidetector CT imaging of the abdomen and pelvis was performed using the standard protocol following bolus administration of intravenous contrast.  CONTRAST:  OMNIPAQUE IOHEXOL 300 MG/ML  SOLN  COMPARISON:  CT of the abdomen and pelvis from 02/10/2013  FINDINGS: The visualized lung bases are clear.  The liver and spleen are unremarkable in appearance. The gallbladder is within normal limits. The pancreas and adrenal glands are unremarkable.  Scattered bilateral renal cysts are seen, measuring up to 4.0 cm in size. There is no evidence of hydronephrosis. No renal or ureteral stones are seen. No perinephric stranding is appreciated.  Mild apparent wall thickening at the right renal pelvis could reflect mild infection. Would correlate for associated symptoms.  No free fluid is identified. The small bowel is unremarkable in appearance. The stomach is within normal limits. No acute vascular abnormalities are seen. Scattered calcification is seen along the  abdominal aorta and its branches.  The appendix is normal in caliber and contains air, without evidence of appendicitis. Scattered diverticulosis is noted along the transverse, descending and proximal sigmoid colon, without evidence of diverticulitis.  The bladder is mildly distended and grossly unremarkable. The uterus is within normal limits. The ovaries are grossly symmetric. No suspicious adnexal masses are seen. No inguinal lymphadenopathy is seen.  No acute osseous abnormalities are identified. Facet disease is noted along the lumbar spine.  IMPRESSION: 1. Mild apparent wall thickening at the right renal pelvis could reflect mild infection. Would correlate for associated symptoms. 2. Scattered diverticulosis along the transverse, descending and proximal sigmoid colon, without evidence of diverticulitis. Relatively small amount of stool noted in the colon. 3. Scattered bilateral renal cysts seen. 4. Scattered calcification along the abdominal aorta and its branches.   Electronically Signed   By: Roanna Raider M.D.   On: 09/22/2014 22:06   Dg Abd Acute W/chest  09/22/2014   CLINICAL DATA:  Right-sided abdominal pain  EXAM: DG ABDOMEN ACUTE W/ 1V CHEST  COMPARISON:  02/10/2013, 11/01/2012  FINDINGS: Cardiac shadow is within normal limits. Postsurgical changes are again noted. Fullness in the right peritracheal region is again identified consistent with the enlarged right lobe of the thyroid stable over multiple previous exams dating back to 2014. No pulmonary infiltrate is noted. Scattered large and small bowel gas is noted. No abnormal mass or abnormal calcifications are seen. Degenerative changes of lumbar spine are noted.  IMPRESSION: Nonspecific chest and abdomen.  Persistent right thyroid mass   Electronically Signed   By: Alcide Clever M.D.   On: 09/22/2014 17:21     EKG Interpretation   Date/Time:  Sunday Sep 22 2014 18:36:47 EDT Ventricular Rate:  83 PR Interval:  200 QRS Duration: 89 QT  Interval:  392 QTC Calculation: 461 R Axis:   46 Text Interpretation:  Sinus rhythm Anteroseptal infarct, age indeterminate  Baseline wander in lead(s) II III aVL aVF No significant change since last  tracing Confirmed by Nilesh Stegall  MD-J, Damarko Stitely ([id number]) on 09/22/2014 7:02:25 PM      MDM   Final diagnoses:  Abdominal pain, acute  UTI (lower urinary tract infection)   Pt presented to the ED for complaints of abdominal pain.  UA was performed at the urgent care.  Pt did have LE in the urine.  Urine culture sent off.  Abd ct with  possible inflammation in the kidney.  Will treat for uti.  Pt's EKG is unchanged.   She has not chest pain in the ED.  Doubt acute coronary syndrome.  Safe for discharge.  Outpatient follow up with her primary doctor.     Linwood Dibbles, MD 09/22/14 2256

## 2014-09-22 NOTE — Progress Notes (Addendum)
Subjective:  This chart was scribed for Meredith Staggers, MD by Stann Ore, Medical Scribe. This patient was seen in room 10 and the patient's care was started 3:08 PM.    Patient ID: [name] [name], female    DOB: [birth year], 74 y.o.   MRN: [id number]  HPI [name] [name] is a 74 y.o. female   She is here for abdominal pain and dizziness. She has history of mitral valve regurgitation with repair in March 2014. Her last visit with Dr. Cornelius Moras, her cardiothoracic surgeon, was in March 2014. No palpitation or dizzy spells at that time. She has a history of hyperthyroidism followed with Dr. [name]. She has atrial fibrillation in the past including admission in 2014. In review of previous notes, she had refused anti-coagulation at her 2014 cardiology visit. Hx of MAZE procedure.  She does take 81 mg Aspirin each day.   She saw Dr. [name] a week ago and she states that she was feeling fine. She had thyroid and electrolyte testing that was ok. She had some right sided abdominal soreness that happened occasionally when seeing Dr. [name] last Tuesday, but minimal.  She stopped having bowel movements starting 3 days ago and took some OTC treatments.. She started having some worsening abdominal pain starting 2 days ago. She took magnesium last night and had multiple bowel movements over last night. She denies fever, vomiting, blood in stool, and melena. No dysuria or gross hematuria.   She had dental procedure 5 days ago. She took 4 amoxicillin before dental procedure. When she left, she felt headache with nausea. She then stated that she had dizziness since 3 days ago. She also mentions when she went grocery shopping 2 days ago, she felt lightheaded, SOB and wanted to pass out and had a dizzy spell. Had to ask for help carrying her bags, but denies slurred speech or focal weakness at the time.   She takes 1 tramadol everyday for pain in multiple areas for arthritis (some difficulty in her history on  this), and took one xanax earlier - occasional use of this.   Her father died of colon cancer. It's been 3 years since her diverticulitis.   Lives by self at home.    Patient Active Problem List   Diagnosis Date Noted  . Status post Maze operation for atrial fibrillation 01/08/2013  . S/P MVR (mitral valve repair) 01/08/2013  . Atrial flutter 10/30/2012  . Amaurosis fugax 10/30/2012  . Dyspnea 10/28/2012  . 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 Thyroid goiter 08/21/2012  . Microscopic hematuria 08/20/2012  . S/P mitral valve repair 06/28/2012  . History of mitral valve prolapse with severe mitral regurgtation 05/19/2012  . Refusal of blood transfusions as patient is Jehovah's Witness 05/19/2012  . Atrial fibrillation 05/16/2012  . Anxiety 04/27/2011  . Hypertension 04/26/2011  . Diverticulitis 04/24/2011  . Hyperlipidemia    Past Medical History  Diagnosis Date  . Hypertension     Then hypotension 04/2012 limiting med adjustment.  . Diverticulitis   . Anxiety   . Mitral valve prolapse     a. H/o MVP, with severe MR 04/2012, had MV repair and Maze  . Hyperlipidemia     recently- taken off Crestor- due to pain in her legs, but this was after cardiac cath. , pt. questioning whether the pain in her legs was related to cath. or crestor  . Goiter   . GERD (gastroesophageal reflux disease)   . Aortic stenosis 05/19/2012  trivial by TEE  . Atrial fibrillation     a. Dx 04/2012;  b. Amio d/c'd 2/2 hyperthyroidism;  c. Atrial flutter 10/2012;  d. On xarelto.  . Refusal of blood transfusions as patient is Jehovah's Witness   . Pneumonia     treated /w antibiotic- [facility]- early Feb. 2014  . S/P Maze operation for atrial fibrillation 06/28/2012    Complete bilateral atrial lesion set using cryothermy ablation with oversewing of LA appendage via right minithoracotomy  . S/P mitral valve repair 06/28/2012    Complex valvuloplasty including quadrangular resection of posterior leaflet,  sliding leaflet plasty, artificial Gore-tex neocord placement x2 and 32mm Sorin Memo 3D ring annuloplasty via right mini thoracotomy   . 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 Microscopic hematuria 08/20/2012  . Shortness of breath   . Heart murmur   . CHF (congestive heart failure)   . Anemia   . Coronary artery disease    Past Surgical History  Procedure Laterality Date  . Uterine fibroid surgery  1988  . Ovarian cyst surgery  1988  . Tubal ligation  1974  . Tee without cardioversion  05/19/2012    Procedure: TRANSESOPHAGEAL ECHOCARDIOGRAM (TEE);  Surgeon: Dolores Patty, MD;  Location: [facility] ENDOSCOPY;  Service: Cardiovascular;  Laterality: N/A;  . Mitral valve repair Right 06/28/2012    Procedure: MINIMALLY INVASIVE MITRAL VALVE REPAIR (MVR);  Surgeon: Purcell Nails, MD;  Location: [facility] OR;  Service: Open Heart Surgery;  Laterality: Right;  . Minimally invasive maze procedure N/A 06/28/2012    Procedure: MINIMALLY INVASIVE MAZE PROCEDURE;  Surgeon: Purcell Nails, MD;  Location: [facility] OR;  Service: Open Heart Surgery;  Laterality: N/A;  . Intraoperative transesophageal echocardiogram N/A 06/28/2012    Procedure: INTRAOPERATIVE TRANSESOPHAGEAL ECHOCARDIOGRAM;  Surgeon: Purcell Nails, MD;  Location: [facility] OR;  Service: Open Heart Surgery;  Laterality: N/A;  . Urethral stricture dilatation    . Coronary artery bypass graft    . Left and right heart catheterization with coronary angiogram N/A 05/22/2012    Procedure: LEFT AND RIGHT HEART CATHETERIZATION WITH CORONARY ANGIOGRAM;  Surgeon: Kathleene Hazel, MD;  Location: [facility] CATH LAB;  Service: Cardiovascular;  Laterality: N/A;   Allergies  Allergen Reactions  . Amiodarone     Hyperthyroidism   . Diltiazem Other (See Comments)    Put into hospital for 3 days  . Sulfa Drugs Cross Reactors Other (See Comments)    Unknown.  . Aspirin Rash  . Naproxen Sodium Swelling and Rash   Prior to Admission medications   Medication Sig  Start Date End Date Taking? Authorizing Provider  acetaminophen (TYLENOL) 500 MG tablet Take 1,000 mg by mouth every 6 (six) hours as needed for pain.    Yes Historical Provider, MD  ALPRAZolam (XANAX) 0.25 MG tablet Take 0.25 mg by mouth 2 (two) times daily as needed for anxiety. For anxiety   Yes Historical Provider, MD  aspirin EC 81 MG tablet Take 81 mg by mouth daily.   Yes Historical Provider, MD  atenolol (TENORMIN) 25 MG tablet Take 12.5 mg by mouth daily.  11/24/12  Yes Historical Provider, MD  fluticasone (FLONASE) 50 MCG/ACT nasal spray Place 2 sprays into the nose daily as needed for allergies.  12/08/12  Yes Historical Provider, MD  methimazole (TAPAZOLE) 5 MG tablet Take 5 mg by mouth daily.   Yes Historical Provider, MD  pantoprazole (PROTONIX) 40 MG tablet Take 40 mg by mouth 2 (two) times daily.  08/24/12  Yes Geoffry Paradise, MD  traMADol (ULTRAM) 50 MG tablet Take 50 mg by mouth 2 (two) times daily as needed for pain.  07/05/12  Yes Erin R Barrett, PA-C   History   Social History  . Marital Status: Divorced    Spouse Name: N/A  . Number of Children: N/A  . Years of Education: N/A   Occupational History  . Not on file.   Social History Main Topics  . Smoking status: Never Smoker   . Smokeless tobacco: Never Used  . Alcohol Use: No     Comment: 1 drink a month  . Drug Use: No  . Sexual Activity: No   Other Topics Concern  . Not on file   Social History Narrative   Divorced - lives alone - remains functionally independent.  Has 3 children.        Review of Systems  Constitutional: Negative for fever.  Respiratory: Positive for shortness of breath.   Gastrointestinal: Positive for nausea, abdominal pain and constipation. Negative for vomiting and blood in stool.  Genitourinary: Negative for dysuria.  Neurological: Positive for dizziness, light-headedness and headaches.       Objective:   Physical Exam  Constitutional: She is oriented to person, place, and  time. She appears well-developed and well-nourished. No distress.  HENT:  Head: Normocephalic and atraumatic.  Eyes: EOM are normal. Pupils are equal, round, and reactive to light. Left eye exhibits no nystagmus.  Neck: Neck supple.  No carotid bruit  Cardiovascular: Normal rate, regular rhythm and normal heart sounds.   No murmur heard. Pulmonary/Chest: Effort normal. No respiratory distress.  Abdominal: Bowel sounds are normal.  Negative murphy's sign, tenderness RLQ and suprapubic; mild tenderness RUQ  Musculoskeletal: Normal range of motion.  Neurological: She is alert and oriented to person, place, and time.  No weakness  Romberg's negative No Pronator drift.  Normal gait No focal weakness on exam   Skin: Skin is warm and dry.  Psychiatric: She has a normal mood and affect. Her behavior is normal.  Nursing note and vitals reviewed.   Filed Vitals:   09/22/14 1419  BP: 110/70  Pulse: 89  Temp: 98.2 F (36.8 C)  TempSrc: Oral  Resp: 16  Weight: 121 lb (54.885 kg)  SpO2: 98%   EKG: SR, rate 60,  nonspecific ST depression V4-V5.  Rhythm strip: 1 pvc, multiple PAc's with slight pause after PAC.   Results for orders placed or performed in visit on 09/22/14  POCT CBC  Result Value Ref Range   WBC 9.6 4.6 - 10.2 K/uL   Lymph, poc 2.6 0.6 - 3.4   POC LYMPH PERCENT 27.1 10 - 50 %L   MID (cbc) 0.8 0 - 0.9   POC MID % 7.9 0 - 12 %M   POC Granulocyte 6.2 2 - 6.9   Granulocyte percent 65.0 37 - 80 %G   RBC 4.73 4.04 - 5.48 M/uL   Hemoglobin 13.5 12.2 - 16.2 g/dL   HCT, POC 16.1 09.6 - 47.9 %   MCV 86.5 80 - 97 fL   MCH, POC 28.5 27 - 31.2 pg   MCHC 32.9 31.8 - 35.4 g/dL   RDW, POC 04.5 %   Platelet Count, POC 272 142 - 424 K/uL   MPV 7.3 0 - 99.8 fL  POCT glucose (manual entry)  Result Value Ref Range   POC Glucose 96 70 - 99 mg/dl  POCT urinalysis dipstick  Result Value Ref Range   Color,  UA yellow    Clarity, UA clear    Glucose, UA neg    Bilirubin, UA neg     Ketones, UA neg    Spec Grav, UA 1.010    Blood, UA large    pH, UA 6.5    Protein, UA neg    Urobilinogen, UA 0.2    Nitrite, UA neg    Leukocytes, UA Trace   POCT UA - Microscopic Only  Result Value Ref Range   WBC, Ur, HPF, POC 0-2    RBC, urine, microscopic 8-12    Bacteria, U Microscopic trace    Mucus, UA neg    Epithelial cells, urine per micros 1-3    Crystals, Ur, HPF, POC neg    Casts, Ur, LPF, POC neg    Yeast, UA neg    [facility] reading (PRIMARY) by  Dr. Neva Seat: acute abd series: prominent intestinal markings on right, no apparent air/fluid levels.        Assessment & Plan:  MIKO PAK is a 74 y.o. female Abdominal pain, generalized - Plan: COMPLETE METABOLIC PANEL WITH GFR, DG Abd Acute W/Chest, POCT urinalysis dipstick, POCT UA - Microscopic Only, CANCELED: DG Abd Acute W/Chest  - RUQ initially, then RLQ and suprapubic with worsening in past 2 days. Self treatment with OTC magnesium for possible constipation yesterday with increased BM's overnight. TTP suprapubic and RLQ, with hematuria on U/A, but no urinary symptoms. History of diverticulitis. DDX includes colitis/diverticulitis, nephrolithiasis, or hemorrhagic cystitis.   -CMP, lipase pending.   -urine culture pending.   -further eval in ER tonight with possible further imaging.    Dizziness, nonspecific abnormal electrocardiogram (ECG) (EKG), Near syncope, History of mitral valve repair and S/P Maze operation for atrial fibrillation, Plan: POCT CBC, POCT glucose (manual entry), COMPLETE METABOLIC PANEL WITH GFR, EKG 12-Lead, POCT urinalysis dipstick, POCT UA - Microscopic   -hx of MAZE procedure. Frequent PAC's and PVC on EKG with nonspecific ST segments/depression   -intermittent episodes of dizziness with near syncope 2 days ago, no focal weakness on exam  -chronic use of ultram, but denies recent change in dose or additional doses of Xanax.   -placed on monitor, tx to ER by EMS for further eval as  multifactorial cause of dizziness possible, but possible abnormal EKG as above and history of cardiac surgery.   Hematuria - Plan: Urine culture  -as above. Asx, but will check culture.  Further eval of abd pain in ER.     No orders of the defined types were placed in this encounter.   Patient Instructions  Due to your dizzy episodes and episode of almost passing out, along with possible abnormal ekg and heart history - it is best if you are evaluated further through the emergency room.   Additionally with your abdominal pain that is worsening, and blood in urine - they may do some other testing in the emergency room.   I personally performed the services described in this documentation, which was scribed in my presence. The recorded information has been reviewed and considered, and addended by me as needed.

## 2014-09-23 LAB — COMPLETE METABOLIC PANEL WITH GFR
ALK PHOS: 80 U/L (ref 39–117)
ALT: 16 U/L (ref 0–35)
AST: 25 U/L (ref 0–37)
Albumin: 4.5 g/dL (ref 3.5–5.2)
BUN: 12 mg/dL (ref 6–23)
CO2: 29 meq/L (ref 19–32)
Calcium: 10.2 mg/dL (ref 8.4–10.5)
Chloride: 98 mEq/L (ref 96–112)
Creat: 0.73 mg/dL (ref 0.50–1.10)
GFR, EST NON AFRICAN AMERICAN: 83 mL/min
GFR, Est African American: >89 mL/min
Glucose, Bld: 106 mg/dL — ABNORMAL HIGH (ref 70–99)
Potassium: 4.7 mEq/L (ref 3.5–5.3)
SODIUM: 135 meq/L (ref 135–145)
Total Bilirubin: 0.7 mg/dL (ref 0.2–1.2)
Total Protein: 7.4 g/dL (ref 6.0–8.3)

## 2014-09-25 LAB — URINE CULTURE: Colony Count: 50000

## 2014-09-26 IMAGING — CR DG CHEST 2V
2 series · 2 of 2 positions shown · non-contrast
Comparison: 08/19/2012 and today's CT, dictated separately.

CLINICAL DATA: Shaking.  Weakness.  Headache.

CHEST - 2 VIEW

[w chest pa]
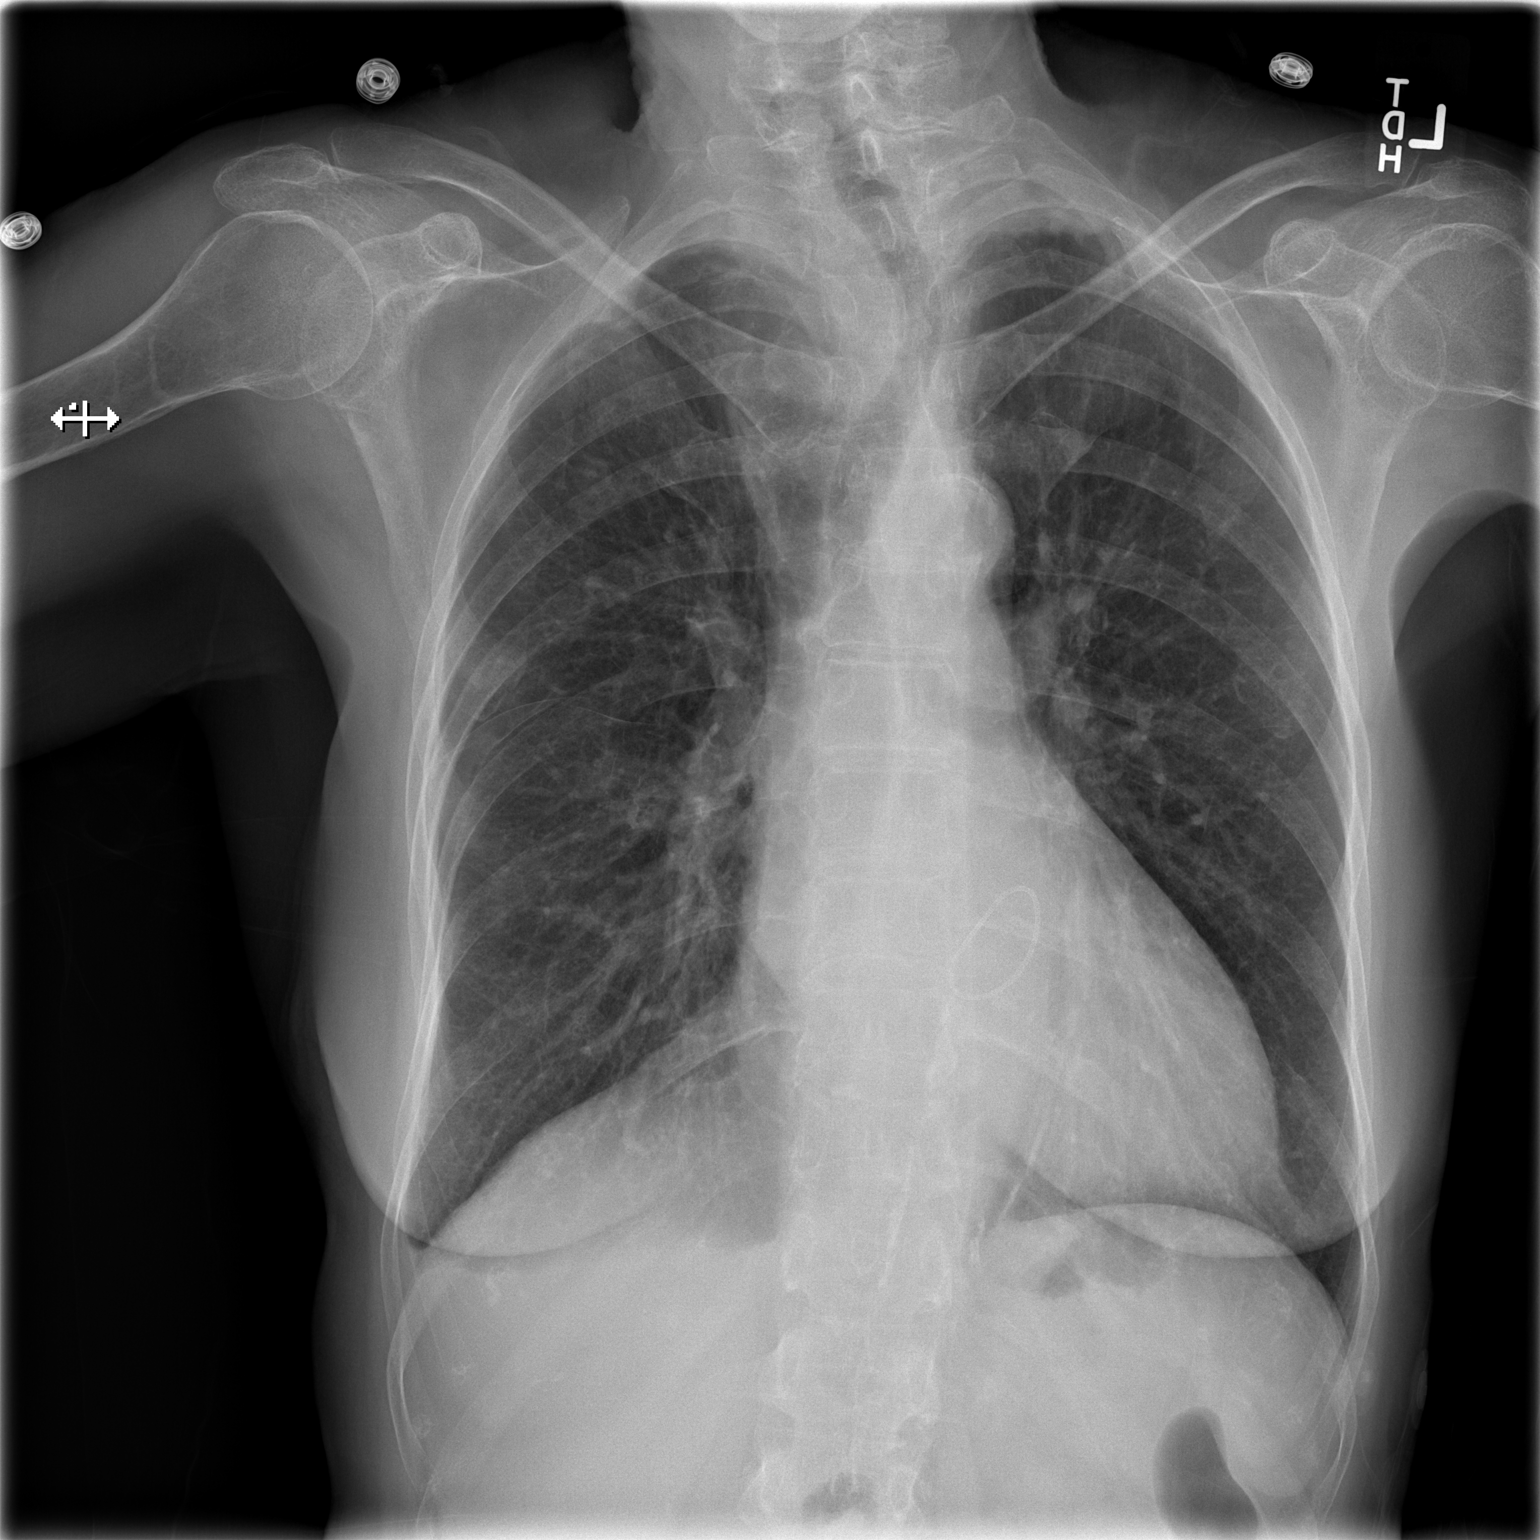

[w chest lat]
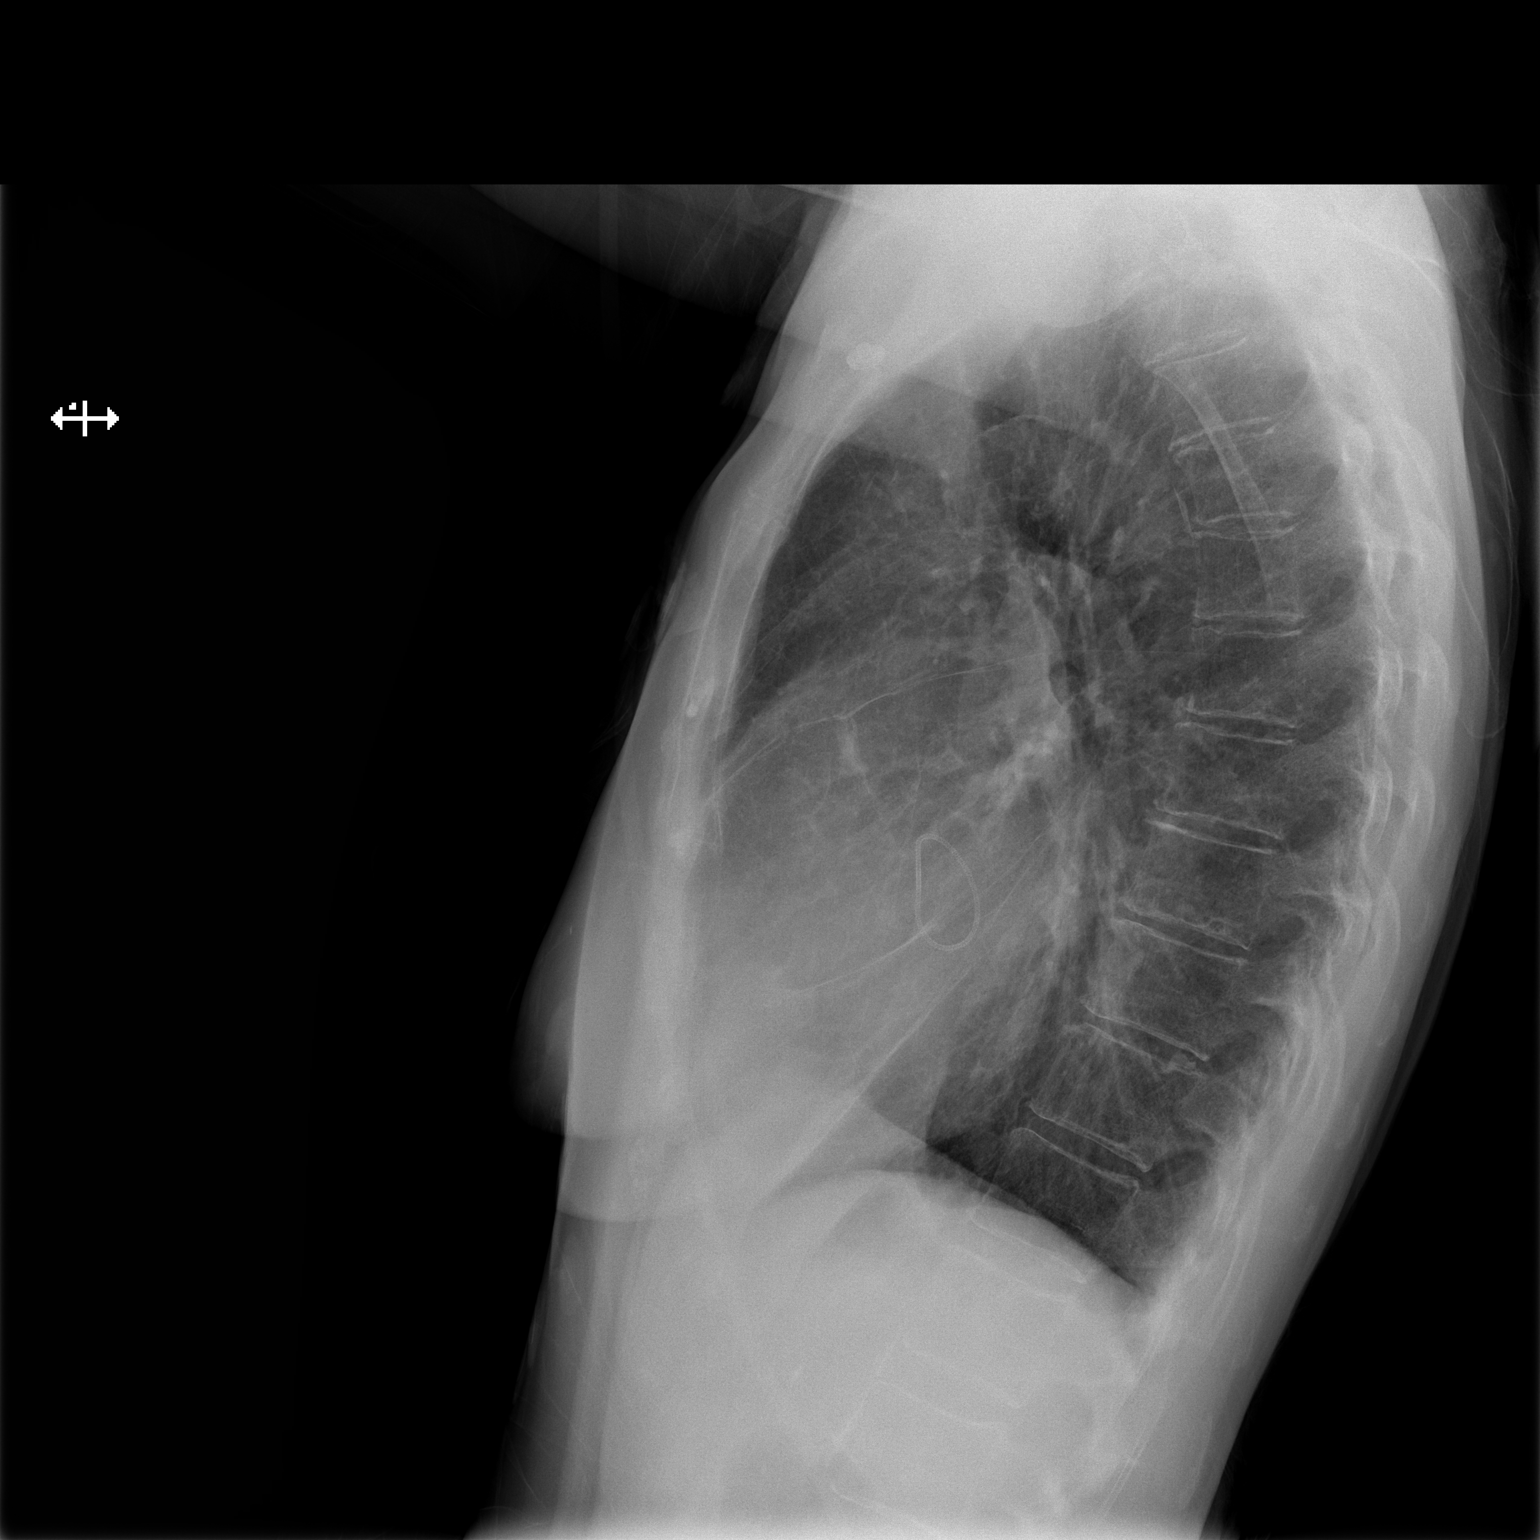

[2 of 2 positions shown; findings below may reference images not displayed]

FINDINGS: Mild osteopenia.  Prior mitral valve repair.

Tracheal deviation left secondary to right-sided thyroid
enlargement.  Biapical pleural thickening. No pleural effusion or
pneumothorax.  No congestive failure.

Clear lungs.
IMPRESSION: Cardiomegaly, without acute disease.

Tracheal deviation to the left, secondary to right sided thyroid
enlargement.

## 2014-11-02 ENCOUNTER — Ambulatory Visit (INDEPENDENT_AMBULATORY_CARE_PROVIDER_SITE_OTHER): Payer: Medicare HMO | Admitting: Family Medicine

## 2014-11-02 VITALS — BP 92/60 | HR 97 | Temp 98.9°F | Resp 18 | Ht 66.0 in | Wt 118.4 lb

## 2014-11-02 DIAGNOSIS — N390 Urinary tract infection, site not specified: Secondary | ICD-10-CM

## 2014-11-02 DIAGNOSIS — R110 Nausea: Secondary | ICD-10-CM

## 2014-11-02 DIAGNOSIS — N359 Urethral stricture, unspecified: Secondary | ICD-10-CM

## 2014-11-02 DIAGNOSIS — R310 Gross hematuria: Secondary | ICD-10-CM | POA: Diagnosis not present

## 2014-11-02 LAB — COMPREHENSIVE METABOLIC PANEL
ALT: 15 U/L (ref 0–35)
AST: 22 U/L (ref 0–37)
Albumin: 4.3 g/dL (ref 3.5–5.2)
Alkaline Phosphatase: 71 U/L (ref 39–117)
BUN: 10 mg/dL (ref 6–23)
CO2: 25 mEq/L (ref 19–32)
Calcium: 9.7 mg/dL (ref 8.4–10.5)
Chloride: 98 mEq/L (ref 96–112)
Creat: 0.73 mg/dL (ref 0.50–1.10)
Glucose, Bld: 155 mg/dL — ABNORMAL HIGH (ref 70–99)
POTASSIUM: 4.3 meq/L (ref 3.5–5.3)
Sodium: 135 mEq/L (ref 135–145)
TOTAL PROTEIN: 6.9 g/dL (ref 6.0–8.3)
Total Bilirubin: 0.9 mg/dL (ref 0.2–1.2)

## 2014-11-02 LAB — POCT UA - MICROSCOPIC ONLY
CRYSTALS, UR, HPF, POC: NEGATIVE
Casts, Ur, LPF, POC: NEGATIVE
Yeast, UA: NEGATIVE

## 2014-11-02 LAB — POCT URINALYSIS DIPSTICK
Bilirubin, UA: NEGATIVE
Glucose, UA: NEGATIVE
Leukocytes, UA: NEGATIVE
Nitrite, UA: NEGATIVE
PH UA: 7.5
PROTEIN UA: 30
SPEC GRAV UA: 1.015
UROBILINOGEN UA: 0.2

## 2014-11-02 LAB — CBC WITH DIFFERENTIAL/PLATELET
Basophils Absolute: 0 10*3/uL (ref 0.0–0.1)
Basophils Relative: 0 % (ref 0–1)
EOS ABS: 0 10*3/uL (ref 0.0–0.7)
Eosinophils Relative: 0 % (ref 0–5)
HEMATOCRIT: 37.5 % (ref 36.0–46.0)
HEMOGLOBIN: 13 g/dL (ref 12.0–15.0)
LYMPHS ABS: 0.4 10*3/uL — AB (ref 0.7–4.0)
Lymphocytes Relative: 3 % — ABNORMAL LOW (ref 12–46)
MCH: 30.3 pg (ref 26.0–34.0)
MCHC: 34.7 g/dL (ref 30.0–36.0)
MCV: 87.4 fL (ref 78.0–100.0)
MONO ABS: 0.4 10*3/uL (ref 0.1–1.0)
MPV: 9.5 fL (ref 8.6–12.4)
Monocytes Relative: 3 % (ref 3–12)
Neutro Abs: 12.3 10*3/uL — ABNORMAL HIGH (ref 1.7–7.7)
Neutrophils Relative %: 94 % — ABNORMAL HIGH (ref 43–77)
Platelets: 256 10*3/uL (ref 150–400)
RBC: 4.29 MIL/uL (ref 3.87–5.11)
RDW: 13.8 % (ref 11.5–15.5)
WBC: 13.1 10*3/uL — AB (ref 4.0–10.5)

## 2014-11-02 MED ORDER — CIPROFLOXACIN HCL 500 MG PO TABS: 500.0000 mg | ORAL_TABLET | Freq: Two times a day (BID) | ORAL | Status: DC

## 2014-11-02 MED ORDER — PHENAZOPYRIDINE HCL 200 MG PO TABS: 200.0000 mg | ORAL_TABLET | Freq: Three times a day (TID) | ORAL | Status: DC | PRN

## 2014-11-02 MED ORDER — ONDANSETRON 4 MG PO TBDP
4.0000 mg | ORAL_TABLET | Freq: Once | ORAL | Status: AC
  Administered 2014-11-02: 4 mg via ORAL

## 2014-11-02 NOTE — Progress Notes (Signed)
Subjective:  This chart was scribed for [name] Sorenson, MD by [name] [name], ED Scribe. This patient was seen in room 13 and the patient's care was started at 12:48 PM.   Patient ID: [name] [name], female    DOB: Sep 19, 1940, 74 y.o.   MRN: [id number]  HPI Chief Complaint  Patient presents with  . Medication Reaction    C/O nausea only-Started on Macrobid yesterday   HPI Comments: [name] [name] is a 74 y.o. female who presents to the [facility] complaining of nausea. Pt states she was seen here 5/29 for abdominal pain and sent to the ED where she was diagnosed with a UTI. Since visit she states symptoms have yet to clear. She was seen by PCP, Dr. Jacky [name], twice, who started her on cephalexin for UTI the first visit but switched back to cipro the second visit after symptoms persisted. She was seen at her gynecologist yesterday where she received a pelvic exam and prescribed macrobid. She did not have blood did blood work yesterday at [facility]. Pt states since starting macrobid she has developed nausea. She also has urinary symptoms decreased urine, straining with urination, and dark colored urine as well as abdominal pain and constipation. She has tried peppermint candy and gingerale without relief.  unfortunately we do no have access to PCP and gynecology note. Her initial evaluation, 6 weeks ago, she presented with abdominal pain and dizziness as well as nausea and constipation. Her vitals were stable.. CBC and glucose was normal and urine was normal other than hematuria. She was sent to ER for further imaging concerns of divertiuclutis and nephrolithiasis and addition to EKG changes. The CT scan showed mild wall thickening at right renal pelvis which correlates with mild infection but no other abnormality. No other lab abnormality was found and pt's urine was contaminated. She was treated with cipro  BID x 5 days.   Past Medical History  Diagnosis Date  . Hypertension      Then hypotension 04/2012 limiting med adjustment.  . Diverticulitis   . Anxiety   . Mitral valve prolapse     a. H/o MVP, with severe MR 04/2012, had MV repair and Maze  . Hyperlipidemia     recently- taken off Crestor- due to pain in her legs, but this was after cardiac cath. , pt. questioning whether the pain in her legs was related to cath. or crestor  . Goiter   . GERD (gastroesophageal reflux disease)   . Aortic stenosis 05/19/2012    trivial by TEE  . Atrial fibrillation     a. Dx 04/2012;  b. Amio d/c'd 2/2 hyperthyroidism;  c. Atrial flutter 10/2012;  d. On xarelto.  . Refusal of blood transfusions as patient is Jehovah's Witness   . Pneumonia     treated /w antibiotic- [facility]- early Feb. 2014  . S/P Maze operation for atrial fibrillation 06/28/2012    Complete bilateral atrial lesion set using cryothermy ablation with oversewing of LA appendage via right minithoracotomy  . S/P mitral valve repair 06/28/2012    Complex valvuloplasty including quadrangular resection of posterior leaflet, sliding leaflet plasty, artificial Gore-tex neocord placement x2 and 32mm Sorin Memo 3D ring annuloplasty via right mini thoracotomy   . 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 Microscopic hematuria 08/20/2012  . Shortness of breath   . Heart murmur   . CHF (congestive heart failure)   . Anemia   . Coronary artery disease    Past Surgical  History  Procedure Laterality Date  . Uterine fibroid surgery  1988  . Ovarian cyst surgery  1988  . Tubal ligation  1974  . Tee without cardioversion  05/19/2012    Procedure: TRANSESOPHAGEAL ECHOCARDIOGRAM (TEE);  Surgeon: [name] Patty, MD;  Location: [facility] ENDOSCOPY;  Service: Cardiovascular;  Laterality: N/A;  . Mitral valve repair Right 06/28/2012    Procedure: MINIMALLY INVASIVE MITRAL VALVE REPAIR (MVR);  Surgeon: [name] Nails, MD;  Location: [facility] OR;  Service: Open Heart Surgery;  Laterality: Right;  . Minimally invasive maze procedure N/A 06/28/2012     Procedure: MINIMALLY INVASIVE MAZE PROCEDURE;  Surgeon: [name] Nails, MD;  Location: [facility] OR;  Service: Open Heart Surgery;  Laterality: N/A;  . Intraoperative transesophageal echocardiogram N/A 06/28/2012    Procedure: INTRAOPERATIVE TRANSESOPHAGEAL ECHOCARDIOGRAM;  Surgeon: [name] Nails, MD;  Location: [facility] OR;  Service: Open Heart Surgery;  Laterality: N/A;  . Urethral stricture dilatation    . Coronary artery bypass graft    . Left and right heart catheterization with coronary angiogram N/A 05/22/2012    Procedure: LEFT AND RIGHT HEART CATHETERIZATION WITH CORONARY ANGIOGRAM;  Surgeon: [name] Hazel, MD;  Location[facility] CATH LAB;  Service: Cardiovascular;  Laterality: N/A;   Prior to Admission medications   Medication Sig Start Date End Date Taking? Authorizing Provider  acetaminophen (TYLENOL) 500 MG tablet Take 1,000 mg by mouth every 6 (six) hours as needed for pain.    Yes Historical Provider, MD  ALPRAZolam (XANAX) 0.25 MG tablet Take 0.25 mg by mouth 2 (two) times daily as needed for anxiety. For anxiety   Yes Historical Provider, MD  aspirin EC 81 MG tablet Take 81 mg by mouth daily.   Yes Historical Provider, MD  atenolol (TENORMIN) 25 MG tablet Take 12.5 mg by mouth daily.  11/24/12  Yes Historical Provider, MD  Cholecalciferol (VITAMIN D3) 2000 UNITS TABS Take 2,000 Units by mouth 2 (two) times daily.   Yes Historical Provider, MD  fluticasone (FLONASE) 50 MCG/ACT nasal spray Place 2 sprays into the nose daily as needed for allergies.  12/08/12  Yes Historical Provider, MD  methimazole (TAPAZOLE) 5 MG tablet Take 5 mg by mouth daily.   Yes Historical Provider, MD  Multiple Vitamin (MULTIVITAMIN WITH MINERALS) TABS tablet Take 1 tablet by mouth daily.   Yes Historical Provider, MD  pantoprazole (PROTONIX) 40 MG tablet Take 40 mg by mouth every morning.  08/24/12  Yes [name] Paradise, MD  Probiotic Product (PROBIOTIC DAILY) CAPS Take 1 capsule by mouth daily.   Yes Historical  Provider, MD  traMADol (ULTRAM) 50 MG tablet Take 1 tablet (50 mg total) by mouth 2 (two) times daily as needed. 09/22/14  Yes Linwood Dibbles, MD  vitamin B-12 (CYANOCOBALAMIN) 1000 MCG tablet Take 1,000 mcg by mouth daily.   Yes Historical Provider, MD   Review of Systems  Gastrointestinal: Positive for nausea, abdominal pain and constipation. Negative for diarrhea.  Genitourinary: Positive for decreased urine volume.   Objective:   Physical Exam  Constitutional: She is oriented to person, place, and time. She appears well-developed and well-nourished. No distress.  HENT:  Head: Normocephalic and atraumatic.  Eyes: Conjunctivae and EOM are normal.  Neck: Neck supple.  Cardiovascular: Normal rate, regular rhythm and normal heart sounds.  Exam reveals no gallop and no friction rub.   No murmur heard. Pulmonary/Chest: Effort normal and breath sounds normal.  Good air movement  Abdominal: Bowel sounds are increased. There is no hepatosplenomegaly.  There is generalized tenderness. There is no rebound and no guarding.  Generalized abdominal tenderness worse in RLQ.    Musculoskeletal: Normal range of motion.  Neurological: She is alert and oriented to person, place, and time.  Skin: Skin is warm and dry.  Psychiatric: She has a normal mood and affect. Her behavior is normal.  Nursing note and vitals reviewed.  Filed Vitals:   11/02/14 1138  BP: 92/60  Pulse: 97  Temp: 98.9 F (37.2 C)  TempSrc: Oral  Resp: 18  Height: 5\' 6"  (1.676 m)  Weight: 118 lb 6 oz (53.695 kg)  SpO2: 97%   Results for orders placed or performed in visit on 11/02/14  POCT UA - Microscopic Only  Result Value Ref Range   WBC, Ur, HPF, POC 0-3    RBC, urine, microscopic 20+    Bacteria, U Microscopic 1+    Mucus, UA Small    Epithelial cells, urine per micros 0-3    Crystals, Ur, HPF, POC negative    Casts, Ur, LPF, POC negative    Yeast, UA negative   POCT urinalysis dipstick  Result Value Ref Range    Color, UA yellow    Clarity, UA clear    Glucose, UA negative    Bilirubin, UA negative    Ketones, UA small    Spec Grav, UA 1.015    Blood, UA large    pH, UA 7.5    Protein, UA 30    Urobilinogen, UA 0.2    Nitrite, UA negative    Leukocytes, UA Negative Negative   Assessment & Plan:   1. Recurrent UTI   2. Nausea without vomiting   3. Hematuria, gross   4. Urethral stricture, unspecified location, unspecified stricture type     Orders Placed This Encounter  Procedures  . Urine culture  . CBC with Differential/Platelet  . Comprehensive metabolic panel  . POCT UA - Microscopic Only  . POCT urinalysis dipstick    Meds ordered this encounter  Medications  . ondansetron (ZOFRAN-ODT) disintegrating tablet 4 mg    Sig:   . ciprofloxacin (CIPRO) 500 MG tablet    Sig: Take 1 tablet (500 mg total) by mouth 2 (two) times daily.    Dispense:  14 tablet    Refill:  0  . phenazopyridine (PYRIDIUM) 200 MG tablet    Sig: Take 1 tablet (200 mg total) by mouth 3 (three) times daily as needed for pain.    Dispense:  10 tablet    Refill:  0    I personally performed the services described in this documentation, which was scribed in my presence. The recorded information has been reviewed and considered, and addended by me as needed.  [name] Sorenson, MD MPH

## 2014-11-02 NOTE — Patient Instructions (Signed)

## 2014-11-03 LAB — URINE CULTURE: Colony Count: 40000

## 2014-11-06 ENCOUNTER — Telehealth: Payer: Self-pay

## 2014-11-06 NOTE — Telephone Encounter (Signed)
Sent to lab pool.  Patient's blood counts so that she definitely has infection in her blood but we can't at all be certain it is coming from her urine in a bladder or kidney infection. Her urine grew out many types of bacteria which usually means it has been contaminated from an outside source.  Her kidneys, liver, and salts in her blood are normal though her blood sugar is a little high.  Since we don't know what we are treating, pt should definitely stay on the cipro but if she is not feeling sig better she needs to come back in for further eval now.  If she is feeling better she should come in for recheck a few days after cipro course is completed.

## 2014-11-06 NOTE — Telephone Encounter (Signed)
Pt calling about labs. Please review.  

## 2015-02-26 ENCOUNTER — Encounter: Payer: Self-pay | Admitting: Internal Medicine

## 2015-05-13 DIAGNOSIS — F419 Anxiety disorder, unspecified: Secondary | ICD-10-CM | POA: Diagnosis not present

## 2015-05-13 DIAGNOSIS — I493 Ventricular premature depolarization: Secondary | ICD-10-CM | POA: Diagnosis not present

## 2015-05-13 DIAGNOSIS — I340 Nonrheumatic mitral (valve) insufficiency: Secondary | ICD-10-CM | POA: Diagnosis not present

## 2015-05-13 DIAGNOSIS — E0590 Thyrotoxicosis, unspecified without thyrotoxic crisis or storm: Secondary | ICD-10-CM | POA: Diagnosis not present

## 2015-05-13 DIAGNOSIS — Z954 Presence of other heart-valve replacement: Secondary | ICD-10-CM | POA: Diagnosis not present

## 2015-05-13 DIAGNOSIS — I480 Paroxysmal atrial fibrillation: Secondary | ICD-10-CM | POA: Diagnosis not present

## 2015-05-13 DIAGNOSIS — I10 Essential (primary) hypertension: Secondary | ICD-10-CM | POA: Diagnosis not present

## 2015-05-26 DIAGNOSIS — R0609 Other forms of dyspnea: Secondary | ICD-10-CM | POA: Diagnosis not present

## 2015-06-13 ENCOUNTER — Other Ambulatory Visit: Payer: Self-pay

## 2015-06-16 ENCOUNTER — Other Ambulatory Visit: Payer: Self-pay

## 2015-06-16 DIAGNOSIS — N644 Mastodynia: Secondary | ICD-10-CM

## 2015-06-25 DIAGNOSIS — I249 Acute ischemic heart disease, unspecified: Secondary | ICD-10-CM

## 2015-06-25 HISTORY — DX: Acute ischemic heart disease, unspecified: I24.9

## 2015-07-04 DIAGNOSIS — E0590 Thyrotoxicosis, unspecified without thyrotoxic crisis or storm: Secondary | ICD-10-CM | POA: Diagnosis not present

## 2015-07-04 DIAGNOSIS — I480 Paroxysmal atrial fibrillation: Secondary | ICD-10-CM | POA: Diagnosis not present

## 2015-07-04 DIAGNOSIS — Z681 Body mass index (BMI) 19 or less, adult: Secondary | ICD-10-CM | POA: Diagnosis not present

## 2015-07-04 DIAGNOSIS — I10 Essential (primary) hypertension: Secondary | ICD-10-CM | POA: Diagnosis not present

## 2015-07-04 DIAGNOSIS — F419 Anxiety disorder, unspecified: Secondary | ICD-10-CM | POA: Diagnosis not present

## 2015-07-04 DIAGNOSIS — N390 Urinary tract infection, site not specified: Secondary | ICD-10-CM | POA: Diagnosis not present

## 2015-07-04 DIAGNOSIS — I5020 Unspecified systolic (congestive) heart failure: Secondary | ICD-10-CM | POA: Diagnosis not present

## 2015-07-15 ENCOUNTER — Inpatient Hospital Stay (HOSPITAL_COMMUNITY)
Admission: AD | Admit: 2015-07-15 | Discharge: 2015-07-18 | DRG: 287 | Disposition: A | Discharge status: Home / Self Care | Payer: PPO | Source: Ambulatory Visit | Attending: Cardiovascular Disease | Admitting: Cardiovascular Disease

## 2015-07-15 ENCOUNTER — Encounter (HOSPITAL_COMMUNITY): Payer: Self-pay | Admitting: General Practice

## 2015-07-15 DIAGNOSIS — Z952 Presence of prosthetic heart valve: Secondary | ICD-10-CM

## 2015-07-15 DIAGNOSIS — Z8679 Personal history of other diseases of the circulatory system: Secondary | ICD-10-CM | POA: Diagnosis not present

## 2015-07-15 DIAGNOSIS — E0500 Thyrotoxicosis with diffuse goiter without thyrotoxic crisis or storm: Secondary | ICD-10-CM | POA: Diagnosis present

## 2015-07-15 DIAGNOSIS — Z79899 Other long term (current) drug therapy: Secondary | ICD-10-CM

## 2015-07-15 DIAGNOSIS — Z882 Allergy status to sulfonamides status: Secondary | ICD-10-CM | POA: Diagnosis not present

## 2015-07-15 DIAGNOSIS — R0602 Shortness of breath: Secondary | ICD-10-CM | POA: Diagnosis not present

## 2015-07-15 DIAGNOSIS — I249 Acute ischemic heart disease, unspecified: Secondary | ICD-10-CM | POA: Diagnosis present

## 2015-07-15 DIAGNOSIS — I4891 Unspecified atrial fibrillation: Secondary | ICD-10-CM | POA: Diagnosis present

## 2015-07-15 DIAGNOSIS — E785 Hyperlipidemia, unspecified: Secondary | ICD-10-CM | POA: Diagnosis not present

## 2015-07-15 DIAGNOSIS — E064 Drug-induced thyroiditis: Secondary | ICD-10-CM | POA: Diagnosis not present

## 2015-07-15 DIAGNOSIS — Z886 Allergy status to analgesic agent status: Secondary | ICD-10-CM | POA: Diagnosis not present

## 2015-07-15 DIAGNOSIS — I272 Other secondary pulmonary hypertension: Secondary | ICD-10-CM | POA: Diagnosis present

## 2015-07-15 DIAGNOSIS — R269 Unspecified abnormalities of gait and mobility: Secondary | ICD-10-CM | POA: Diagnosis not present

## 2015-07-15 DIAGNOSIS — I5031 Acute diastolic (congestive) heart failure: Secondary | ICD-10-CM | POA: Diagnosis not present

## 2015-07-15 DIAGNOSIS — F419 Anxiety disorder, unspecified: Secondary | ICD-10-CM | POA: Diagnosis not present

## 2015-07-15 DIAGNOSIS — Z531 Procedure and treatment not carried out because of patient's decision for reasons of belief and group pressure: Secondary | ICD-10-CM | POA: Diagnosis present

## 2015-07-15 DIAGNOSIS — Z888 Allergy status to other drugs, medicaments and biological substances status: Secondary | ICD-10-CM

## 2015-07-15 DIAGNOSIS — I2510 Atherosclerotic heart disease of native coronary artery without angina pectoris: Secondary | ICD-10-CM | POA: Diagnosis present

## 2015-07-15 DIAGNOSIS — Z7982 Long term (current) use of aspirin: Secondary | ICD-10-CM

## 2015-07-15 DIAGNOSIS — Z8673 Personal history of transient ischemic attack (TIA), and cerebral infarction without residual deficits: Secondary | ICD-10-CM

## 2015-07-15 DIAGNOSIS — I342 Nonrheumatic mitral (valve) stenosis: Secondary | ICD-10-CM | POA: Diagnosis not present

## 2015-07-15 DIAGNOSIS — I110 Hypertensive heart disease with heart failure: Secondary | ICD-10-CM | POA: Diagnosis not present

## 2015-07-15 DIAGNOSIS — R200 Anesthesia of skin: Secondary | ICD-10-CM

## 2015-07-15 DIAGNOSIS — R9431 Abnormal electrocardiogram [ECG] [EKG]: Secondary | ICD-10-CM | POA: Diagnosis not present

## 2015-07-15 DIAGNOSIS — R0789 Other chest pain: Secondary | ICD-10-CM | POA: Diagnosis not present

## 2015-07-15 DIAGNOSIS — K219 Gastro-esophageal reflux disease without esophagitis: Secondary | ICD-10-CM | POA: Diagnosis present

## 2015-07-15 DIAGNOSIS — R202 Paresthesia of skin: Secondary | ICD-10-CM

## 2015-07-15 HISTORY — DX: Acute ischemic heart disease, unspecified: I24.9

## 2015-07-15 HISTORY — DX: Headache, unspecified: R51.9

## 2015-07-15 HISTORY — DX: Headache: R51

## 2015-07-15 HISTORY — DX: Adverse effect of unspecified anesthetic, initial encounter: T41.45XA

## 2015-07-15 HISTORY — DX: Other complications of anesthesia, initial encounter: T88.59XA

## 2015-07-15 LAB — CBC WITH DIFFERENTIAL/PLATELET
Basophils Absolute: 0 10*3/uL (ref 0.0–0.1)
Basophils Relative: 0 %
EOS ABS: 0.1 10*3/uL (ref 0.0–0.7)
Eosinophils Relative: 1 %
HCT: 35.5 % — ABNORMAL LOW (ref 36.0–46.0)
HEMOGLOBIN: 11.5 g/dL — AB (ref 12.0–15.0)
LYMPHS ABS: 2.2 10*3/uL (ref 0.7–4.0)
LYMPHS PCT: 30 %
MCH: 28.6 pg (ref 26.0–34.0)
MCHC: 32.4 g/dL (ref 30.0–36.0)
MCV: 88.3 fL (ref 78.0–100.0)
Monocytes Absolute: 0.7 10*3/uL (ref 0.1–1.0)
Monocytes Relative: 9 %
NEUTROS PCT: 60 %
Neutro Abs: 4.3 10*3/uL (ref 1.7–7.7)
Platelets: 214 10*3/uL (ref 150–400)
RBC: 4.02 MIL/uL (ref 3.87–5.11)
RDW: 13.3 % (ref 11.5–15.5)
WBC: 7.2 10*3/uL (ref 4.0–10.5)

## 2015-07-15 LAB — COMPREHENSIVE METABOLIC PANEL
ALK PHOS: 70 U/L (ref 38–126)
ALT: 16 U/L (ref 14–54)
AST: 23 U/L (ref 15–41)
Albumin: 3.8 g/dL (ref 3.5–5.0)
Anion gap: 10 (ref 5–15)
BILIRUBIN TOTAL: 0.4 mg/dL (ref 0.3–1.2)
BUN: 11 mg/dL (ref 6–20)
CHLORIDE: 103 mmol/L (ref 101–111)
CO2: 25 mmol/L (ref 22–32)
Calcium: 10.2 mg/dL (ref 8.9–10.3)
Creatinine, Ser: 0.81 mg/dL (ref 0.44–1.00)
Glucose, Bld: 94 mg/dL (ref 65–99)
POTASSIUM: 4.1 mmol/L (ref 3.5–5.1)
Sodium: 138 mmol/L (ref 135–145)
Total Protein: 6.5 g/dL (ref 6.5–8.1)

## 2015-07-15 LAB — TROPONIN I

## 2015-07-15 LAB — BRAIN NATRIURETIC PEPTIDE: B Natriuretic Peptide: 471.3 pg/mL — ABNORMAL HIGH (ref 0.0–100.0)

## 2015-07-15 MED ORDER — VITAMIN D 1000 UNITS PO TABS
2000.0000 [IU] | ORAL_TABLET | Freq: Two times a day (BID) | ORAL | Status: DC
  Administered 2015-07-15 – 2015-07-18 (×6): 2000 [IU] via ORAL
  Filled 2015-07-15 (×6): qty 2

## 2015-07-15 MED ORDER — METHIMAZOLE 5 MG PO TABS
5.0000 mg | ORAL_TABLET | Freq: Every day | ORAL | Status: DC
  Administered 2015-07-16 – 2015-07-18 (×3): 5 mg via ORAL
  Filled 2015-07-15 (×6): qty 1

## 2015-07-15 MED ORDER — NITROGLYCERIN 0.4 MG SL SUBL: 0.4000 mg | SUBLINGUAL_TABLET | SUBLINGUAL | Status: DC | PRN

## 2015-07-15 MED ORDER — SODIUM CHLORIDE 0.9% FLUSH: 3.0000 mL | Freq: Two times a day (BID) | INTRAVENOUS | Status: DC

## 2015-07-15 MED ORDER — PROBIOTIC DAILY PO CAPS: 1.0000 | ORAL_CAPSULE | Freq: Every day | ORAL | Status: DC

## 2015-07-15 MED ORDER — FLUTICASONE PROPIONATE 50 MCG/ACT NA SUSP
1.0000 | Freq: Every day | NASAL | Status: DC | PRN
  Filled 2015-07-15: qty 16

## 2015-07-15 MED ORDER — VITAMIN D3 50 MCG (2000 UT) PO TABS: 2000.0000 [IU] | ORAL_TABLET | Freq: Two times a day (BID) | ORAL | Status: DC

## 2015-07-15 MED ORDER — ONDANSETRON HCL 4 MG/2ML IJ SOLN
4.0000 mg | Freq: Four times a day (QID) | INTRAMUSCULAR | Status: DC | PRN
  Administered 2015-07-16: 4 mg via INTRAVENOUS
  Filled 2015-07-15 (×2): qty 2

## 2015-07-15 MED ORDER — SODIUM CHLORIDE 0.9 % IV SOLN: INTRAVENOUS | Status: DC

## 2015-07-15 MED ORDER — HEPARIN BOLUS VIA INFUSION
3000.0000 [IU] | Freq: Once | INTRAVENOUS | Status: AC
  Administered 2015-07-15: 3000 [IU] via INTRAVENOUS
  Filled 2015-07-15: qty 3000

## 2015-07-15 MED ORDER — TRAMADOL HCL 50 MG PO TABS
50.0000 mg | ORAL_TABLET | Freq: Two times a day (BID) | ORAL | Status: DC
Start: 2015-07-15 — End: 2015-07-18
  Administered 2015-07-15 – 2015-07-18 (×6): 50 mg via ORAL
  Filled 2015-07-15 (×6): qty 1

## 2015-07-15 MED ORDER — ALPRAZOLAM 0.25 MG PO TABS
0.2500 mg | ORAL_TABLET | Freq: Two times a day (BID) | ORAL | Status: DC | PRN
  Administered 2015-07-15 – 2015-07-17 (×3): 0.25 mg via ORAL
  Filled 2015-07-15 (×3): qty 1

## 2015-07-15 MED ORDER — SODIUM CHLORIDE 0.9% FLUSH: 3.0000 mL | INTRAVENOUS | Status: DC | PRN

## 2015-07-15 MED ORDER — VITAMIN B-12 1000 MCG PO TABS
1000.0000 ug | ORAL_TABLET | Freq: Every day | ORAL | Status: DC
  Administered 2015-07-16 – 2015-07-18 (×3): 1000 ug via ORAL
  Filled 2015-07-15 (×3): qty 1

## 2015-07-15 MED ORDER — ATENOLOL 25 MG PO TABS
12.5000 mg | ORAL_TABLET | Freq: Every day | ORAL | Status: DC
  Administered 2015-07-16 – 2015-07-17 (×2): 12.5 mg via ORAL
  Filled 2015-07-15 (×2): qty 1

## 2015-07-15 MED ORDER — SODIUM CHLORIDE 0.9 % IV SOLN: 250.0000 mL | INTRAVENOUS | Status: DC | PRN

## 2015-07-15 MED ORDER — PANTOPRAZOLE SODIUM 40 MG PO TBEC
40.0000 mg | DELAYED_RELEASE_TABLET | Freq: Every morning | ORAL | Status: DC
Start: 2015-07-16 — End: 2015-07-18
  Administered 2015-07-16 – 2015-07-18 (×3): 40 mg via ORAL
  Filled 2015-07-15 (×3): qty 1

## 2015-07-15 MED ORDER — RISAQUAD PO CAPS
1.0000 | ORAL_CAPSULE | Freq: Every day | ORAL | Status: DC
  Administered 2015-07-16 – 2015-07-18 (×3): 1 via ORAL
  Filled 2015-07-15 (×6): qty 1

## 2015-07-15 MED ORDER — HEPARIN (PORCINE) IN NACL 100-0.45 UNIT/ML-% IJ SOLN
700.0000 [IU]/h | INTRAMUSCULAR | Status: DC
  Administered 2015-07-15: 600 [IU]/h via INTRAVENOUS
  Filled 2015-07-15: qty 250

## 2015-07-15 MED ORDER — SODIUM CHLORIDE 0.9 % IV SOLN
250.0000 mL | INTRAVENOUS | Status: DC | PRN
Start: 2015-07-15 — End: 2015-07-15

## 2015-07-15 MED ORDER — SODIUM CHLORIDE 0.9 % IV SOLN
INTRAVENOUS | Status: DC
Start: 2015-07-15 — End: 2015-07-16
  Administered 2015-07-15: via INTRAVENOUS

## 2015-07-15 MED ORDER — SIMETHICONE 80 MG PO CHEW
80.0000 mg | CHEWABLE_TABLET | Freq: Four times a day (QID) | ORAL | Status: DC | PRN
  Administered 2015-07-16: 80 mg via ORAL
  Filled 2015-07-15: qty 1

## 2015-07-15 MED ORDER — ACETAMINOPHEN 325 MG PO TABS
650.0000 mg | ORAL_TABLET | ORAL | Status: DC | PRN
  Administered 2015-07-16 – 2015-07-17 (×2): 650 mg via ORAL
  Filled 2015-07-15: qty 2

## 2015-07-15 NOTE — H&P (Signed)
Referring Physician: Dr. Lonna Duval. [name] [name]  [name] [name] is an 75 y.o. female.                       Chief Complaint: Weakness, nausea and back pain  HPI: 75 year old female has one week history of weakness, nausea, back pain and arm numbness.She has noticed shortness of breath with activity. No fever, cough or chest pain. She has PMH of mitral valve repair by Dr. Barry Dienes in 06/2012. EKG done by Dr. Verl Dicker office showed anterior ischemia.  Past Medical History  Diagnosis Date  . Hypertension     Then hypotension 04/2012 limiting med adjustment.  . Diverticulitis   . Anxiety   . Mitral valve prolapse     a. H/o MVP, with severe MR 04/2012, had MV repair and Maze  . Hyperlipidemia     recently- taken off Crestor- due to pain in her legs, but this was after cardiac cath. , pt. questioning whether the pain in her legs was related to cath. or crestor  . Goiter   . GERD (gastroesophageal reflux disease)   . Aortic stenosis 05/19/2012    trivial by TEE  . Atrial fibrillation (HCC)     a. Dx 04/2012;  b. Amio d/c'd 2/2 hyperthyroidism;  c. Atrial flutter 10/2012;  d. On xarelto.  . Refusal of blood transfusions as patient is Jehovah's Witness   . Pneumonia     treated /w antibiotic- [facility]- early Feb. 2014  . S/P Maze operation for atrial fibrillation 06/28/2012    Complete bilateral atrial lesion set using cryothermy ablation with oversewing of LA appendage via right minithoracotomy  . S/P mitral valve repair 06/28/2012    Complex valvuloplasty including quadrangular resection of posterior leaflet, sliding leaflet plasty, artificial Gore-tex neocord placement x2 and 58mm Sorin Memo 3D ring annuloplasty via right mini thoracotomy   . 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 Microscopic hematuria 08/20/2012  . Shortness of breath   . Heart murmur   . CHF (congestive heart failure) (HCC)   . Anemia   . Coronary artery disease   . Acute coronary syndrome (HCC) 06/2015  .  Complication of anesthesia   . PONV (postoperative nausea and vomiting)   . Headache       Past Surgical History  Procedure Laterality Date  . Uterine fibroid surgery  1988  . Ovarian cyst surgery  1988  . Tubal ligation  1974  . Tee without cardioversion  05/19/2012    Procedure: TRANSESOPHAGEAL ECHOCARDIOGRAM (TEE);  Surgeon: Dolores Patty, MD;  Location: [facility] ENDOSCOPY;  Service: Cardiovascular;  Laterality: N/A;  . Mitral valve repair Right 06/28/2012    Procedure: MINIMALLY INVASIVE MITRAL VALVE REPAIR (MVR);  Surgeon: Purcell Nails, MD;  Location: [facility] OR;  Service: Open Heart Surgery;  Laterality: Right;  . Minimally invasive maze procedure N/A 06/28/2012    Procedure: MINIMALLY INVASIVE MAZE PROCEDURE;  Surgeon: Purcell Nails, MD;  Location: [facility] OR;  Service: Open Heart Surgery;  Laterality: N/A;  . Intraoperative transesophageal echocardiogram N/A 06/28/2012    Procedure: INTRAOPERATIVE TRANSESOPHAGEAL ECHOCARDIOGRAM;  Surgeon: Purcell Nails, MD;  Location: [facility] OR;  Service: Open Heart Surgery;  Laterality: N/A;  . Urethral stricture dilatation    . Coronary artery bypass graft    . Left and right heart catheterization with coronary angiogram N/A 05/22/2012    Procedure: LEFT AND RIGHT HEART CATHETERIZATION WITH CORONARY ANGIOGRAM;  Surgeon: Kathleene Hazel, MD;  Location: [facility] CATH LAB;  Service: Cardiovascular;  Laterality: N/A;    Family History  Problem Relation Age of Onset  . Colon cancer Father    Social History:  reports that she has never smoked. She has never used smokeless tobacco. She reports that she does not drink alcohol or use illicit drugs.  Allergies:  Allergies  Allergen Reactions  . Amiodarone Other (See Comments)    Hyperthyroidism   . Diltiazem Other (See Comments)    Put into hospital for 3 days  . Macrobid [Nitrofurantoin Monohyd Macro] Nausea Only  . Aspirin Rash  . Naproxen Sodium Swelling and Rash  . Sulfa Drugs Cross Reactors Other (See  Comments)    Unknown    Medications Prior to Admission  Medication Sig Dispense Refill  . acetaminophen (TYLENOL) 500 MG tablet Take 1,000 mg by mouth every 6 (six) hours as needed for pain.     [name] ALPRAZolam (XANAX) 0.25 MG tablet Take 0.25 mg by mouth 2 (two) times daily as needed for anxiety. For anxiety    . aspirin EC 81 MG tablet Take 81 mg by mouth daily.    [name] atenolol (TENORMIN) 25 MG tablet Take 12.5 mg by mouth daily.     . Cholecalciferol (VITAMIN D3) 2000 UNITS TABS Take 2,000 Units by mouth 2 (two) times daily.    . ciprofloxacin (CIPRO) 500 MG tablet Take 1 tablet (500 mg total) by mouth 2 (two) times daily. 14 tablet 0  . fluticasone (FLONASE) 50 MCG/ACT nasal spray Place 2 sprays into the nose daily as needed for allergies.     . methimazole (TAPAZOLE) 5 MG tablet Take 5 mg by mouth daily.    . Multiple Vitamin (MULTIVITAMIN WITH MINERALS) TABS tablet Take 1 tablet by mouth daily.    . pantoprazole (PROTONIX) 40 MG tablet Take 40 mg by mouth every morning.     . phenazopyridine (PYRIDIUM) 200 MG tablet Take 1 tablet (200 mg total) by mouth 3 (three) times daily as needed for pain. 10 tablet 0  . Probiotic Product (PROBIOTIC DAILY) CAPS Take 1 capsule by mouth daily.    . traMADol (ULTRAM) 50 MG tablet Take 1 tablet (50 mg total) by mouth 2 (two) times daily as needed. 30 tablet 0  . vitamin B-12 (CYANOCOBALAMIN) 1000 MCG tablet Take 1,000 mcg by mouth daily.      No results found for this or any previous visit (from the past 48 hour(s)). No results found.  Review Of Systems General:normal appetite, decreased energy, no weight gain or loss, no fever Cardiac:no chest pain with exertion, occasional chest pain at rest, + SOB with mild exertion, + resting SOB, + PND, + orthopnea, + palpitations, + arrhythmia, + atrial fibrillation, mild LE edema, + dizzy spells, no syncope Respiratory:+ shortness of breath, no bronchitis, no hemoptysis, no asthma, no sleep  apnea. GI:no difficulty swallowing, no reflux, + frequent heartburn, no hiatal hernia, no abdominal pain, no constipation, no diarrhea, no hematochezia, no hematemesis, no melena GU:no dysuria, no frequency, no urinary tract infection, no hematuria, no kidney stones, no kidney disease Vascular:no claudication, no pain in feet, no leg cramps, no varicose veins, no DVT, no non-healing foot ulcer Neuro:no stroke, no TIA's, no seizures, occasional headaches, no temporary blindness one eye, no slurred speech, no peripheral neuropathy, no chronic pain, no instability of gait, no memory/cognitive dysfunction Musculoskeletal:+ arthritis, no joint swelling, no myalgias, no difficulty walking, normal mobility  Skin:no rash, no itching, no skin infections, no pressure  sores or ulcerations Psych:+ anxiety, no depression, + nervousness, no unusual recent stress Eyes:no blurry vision, no floaters, no recent vision changes, doesn't wears glasses or contacts ENT:no hearing loss, no loose or painful teeth, no dentures, gets routine dental prophylaxis Hematologic:No easy bruising, no abnormal bleeding, no clotting disorder, no frequent epistaxis Endocrine:no diabetes, does not check CBG's at home  Blood pressure 136/55, pulse 94, temperature 97.9 F (36.6 C), temperature source Oral, resp. rate 18, height 5' 6.5" (1.689 m), weight 53.706 kg (118 lb 6.4 oz), SpO2 100 %. General:Well built, averagely nourished, thin well-appearing HEENT:[place]/AT, Hazel eyes, EOMI. Conj-pink, Sclera: White.  Neck:no JVD, no bruits, no adenopathy  Chest:Clear breath sounds, bilateral, no wheezes, no rhonchi. ZO:[id number] rhythm w/ normal rate, + II/VI systolic murmur  Abdomen:soft, non-tender, no masses  Extremities:warm, well-perfused, pulses palpable. Neuro:Grossly non-focal and symmetrical throughout. Moves all four extremities Skin:Warm and dry.  Assessment/Plan Acute coronary syndrome S/P  Mitral valve repair Hypertension H/O Thyrotoxicosis-amiodarone induced  Admit/Echocardiogram/Possible cardiac cath or nuclear stress test post cycling Troponin-I.  Ricki Rodriguez, MD  07/15/2015, 6:07 PM

## 2015-07-15 NOTE — Progress Notes (Signed)
ANTICOAGULATION CONSULT NOTE - Initial Consult  Pharmacy Consult for Heparin  Indication: chest pain/ACS  Allergies  Allergen Reactions  . Amiodarone Other (See Comments)    Hyperthyroidism   . Diltiazem Other (See Comments)    Put into hospital for 3 days  . Macrobid [Nitrofurantoin Monohyd Macro] Nausea Only  . Aspirin Rash  . Naproxen Sodium Swelling and Rash  . Sulfa Drugs Cross Reactors Other (See Comments)    Unknown    Patient Measurements: Height: 5' 6.5" (168.9 cm) Weight: 118 lb 6.4 oz (53.706 kg) IBW/kg (Calculated) : 60.45 Heparin Dosing Weight: 53 kg   Vital Signs: Temp: 97.9 F (36.6 C) (03/21 1717) Temp Source: Oral (03/21 1717) BP: 136/55 mmHg (03/21 1717) Pulse Rate: 94 (03/21 1717)  Labs: No results for input(s): HGB, HCT, PLT, APTT, LABPROT, INR, HEPARINUNFRC, HEPRLOWMOCWT, CREATININE, CKTOTAL, CKMB, TROPONINI in the last 72 hours.  CrCl cannot be calculated (Patient has no serum creatinine result on file.).   Medical History: Past Medical History  Diagnosis Date  . Hypertension     Then hypotension 04/2012 limiting med adjustment.  . Diverticulitis   . Anxiety   . Mitral valve prolapse     a. H/o MVP, with severe MR 04/2012, had MV repair and Maze  . Hyperlipidemia     recently- taken off Crestor- due to pain in her legs, but this was after cardiac cath. , pt. questioning whether the pain in her legs was related to cath. or crestor  . Goiter   . GERD (gastroesophageal reflux disease)   . Aortic stenosis 05/19/2012    trivial by TEE  . Atrial fibrillation (HCC)     a. Dx 04/2012;  b. Amio d/c'd 2/2 hyperthyroidism;  c. Atrial flutter 10/2012;  d. On xarelto.  . Refusal of blood transfusions as patient is Jehovah's Witness   . Pneumonia     treated /w antibiotic- [facility]- early Feb. 2014  . S/P Maze operation for atrial fibrillation 06/28/2012    Complete bilateral atrial lesion set using cryothermy ablation with oversewing of LA appendage via right  minithoracotomy  . S/P mitral valve repair 06/28/2012    Complex valvuloplasty including quadrangular resection of posterior leaflet, sliding leaflet plasty, artificial Gore-tex neocord placement x2 and 32mm Sorin Memo 3D ring annuloplasty via right mini thoracotomy   . 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 Microscopic hematuria 08/20/2012  . Shortness of breath   . Heart murmur   . CHF (congestive heart failure) (HCC)   . Anemia   . Coronary artery disease   . Acute coronary syndrome (HCC) 06/2015    Medications:  Prescriptions prior to admission  Medication Sig Dispense Refill Last Dose  . acetaminophen (TYLENOL) 500 MG tablet Take 1,000 mg by mouth every 6 (six) hours as needed for pain.    Taking  . ALPRAZolam (XANAX) 0.25 MG tablet Take 0.25 mg by mouth 2 (two) times daily as needed for anxiety. For anxiety   Taking  . aspirin EC 81 MG tablet Take 81 mg by mouth daily.   Taking  . atenolol (TENORMIN) 25 MG tablet Take 12.5 mg by mouth daily.    Taking  . Cholecalciferol (VITAMIN D3) 2000 UNITS TABS Take 2,000 Units by mouth 2 (two) times daily.   Taking  . ciprofloxacin (CIPRO) 500 MG tablet Take 1 tablet (500 mg total) by mouth 2 (two) times daily. 14 tablet 0   . fluticasone (FLONASE) 50 MCG/ACT nasal spray Place 2 sprays into the nose  daily as needed for allergies.    Taking  . methimazole (TAPAZOLE) 5 MG tablet Take 5 mg by mouth daily.   Taking  . Multiple Vitamin (MULTIVITAMIN WITH MINERALS) TABS tablet Take 1 tablet by mouth daily.   Taking  . pantoprazole (PROTONIX) 40 MG tablet Take 40 mg by mouth every morning.    Taking  . phenazopyridine (PYRIDIUM) 200 MG tablet Take 1 tablet (200 mg total) by mouth 3 (three) times daily as needed for pain. 10 tablet 0   . Probiotic Product (PROBIOTIC DAILY) CAPS Take 1 capsule by mouth daily.   Taking  . traMADol (ULTRAM) 50 MG tablet Take 1 tablet (50 mg total) by mouth 2 (two) times daily as needed. 30 tablet 0 Taking  . vitamin  B-12 (CYANOCOBALAMIN) 1000 MCG tablet Take 1,000 mcg by mouth daily.   Taking    Assessment: Pharmacy consulted to start IV heparin in 42 YOF for ACS who presented with nausea, back pain and arm numbness. Per daughter, patient was not on any anticoagulation prior to admission. H/H low, Plt wnl.    Goal of Therapy:  Heparin level 0.3-0.7 units/ml Monitor platelets by anticoagulation protocol: Yes   Plan:  -Give heparin 4000 units IV bolus followed by heparin 600 units/hr -F/u 8 hr HL -Monitor daily HL, CBC and s/s of bleeding   Vinnie Level, PharmD., BCPS Clinical Pharmacist Pager [phone]

## 2015-07-15 NOTE — Progress Notes (Signed)
Admission note:  Arrival Method: Pt a direct admit from doctors office. Pt arrived on the floor on a wheelchair Mental Orientation: alert and oriented X4 Telemetry. Placed on tele, ccmd notified Assessment: completed see flowsheet Skin: dry warm and intact  Patient has been oriented to the unit, staff and to the room.call light within reach . Will continue to monitor

## 2015-07-16 ENCOUNTER — Encounter (HOSPITAL_COMMUNITY): Payer: Self-pay | Admitting: Cardiovascular Disease

## 2015-07-16 ENCOUNTER — Encounter (HOSPITAL_COMMUNITY)
Admission: AD | Disposition: A | Discharge status: Home / Self Care | Payer: Self-pay | Source: Ambulatory Visit | Attending: Cardiovascular Disease

## 2015-07-16 ENCOUNTER — Inpatient Hospital Stay (HOSPITAL_COMMUNITY): Payer: PPO

## 2015-07-16 DIAGNOSIS — I5031 Acute diastolic (congestive) heart failure: Secondary | ICD-10-CM | POA: Diagnosis not present

## 2015-07-16 DIAGNOSIS — I342 Nonrheumatic mitral (valve) stenosis: Secondary | ICD-10-CM | POA: Diagnosis not present

## 2015-07-16 DIAGNOSIS — E0500 Thyrotoxicosis with diffuse goiter without thyrotoxic crisis or storm: Secondary | ICD-10-CM | POA: Diagnosis not present

## 2015-07-16 DIAGNOSIS — R9431 Abnormal electrocardiogram [ECG] [EKG]: Secondary | ICD-10-CM | POA: Diagnosis not present

## 2015-07-16 DIAGNOSIS — R202 Paresthesia of skin: Secondary | ICD-10-CM | POA: Diagnosis not present

## 2015-07-16 HISTORY — PX: CARDIAC CATHETERIZATION: SHX172

## 2015-07-16 LAB — HEPARIN LEVEL (UNFRACTIONATED): HEPARIN UNFRACTIONATED: 0.23 [IU]/mL — AB (ref 0.30–0.70)

## 2015-07-16 LAB — BASIC METABOLIC PANEL
Anion gap: 9 (ref 5–15)
BUN: 10 mg/dL (ref 6–20)
CALCIUM: 9.9 mg/dL (ref 8.9–10.3)
CHLORIDE: 105 mmol/L (ref 101–111)
CO2: 25 mmol/L (ref 22–32)
CREATININE: 0.73 mg/dL (ref 0.44–1.00)
GFR calc Af Amer: >60 mL/min (ref >60)
GFR calc non Af Amer: >60 mL/min (ref >60)
GLUCOSE: 94 mg/dL (ref 65–99)
POTASSIUM: 4 mmol/L (ref 3.5–5.1)
Sodium: 139 mmol/L (ref 135–145)

## 2015-07-16 LAB — LIPID PANEL
Cholesterol: 226 mg/dL — ABNORMAL HIGH (ref 0–200)
HDL: 45 mg/dL (ref >40)
LDL CALC: 166 mg/dL — AB (ref 0–99)
Total CHOL/HDL Ratio: 5 RATIO
Triglycerides: 74 mg/dL (ref <150)
VLDL: 15 mg/dL (ref 0–40)

## 2015-07-16 LAB — ECHOCARDIOGRAM COMPLETE
Height: 66.5 in
WEIGHTICAEL: 1843.2 [oz_av]

## 2015-07-16 LAB — PROTIME-INR
INR: 1.07 (ref 0.00–1.49)
PROTHROMBIN TIME: 14.1 s (ref 11.6–15.2)

## 2015-07-16 LAB — TROPONIN I

## 2015-07-16 LAB — POCT ACTIVATED CLOTTING TIME: ACTIVATED CLOTTING TIME: 157 s

## 2015-07-16 SURGERY — LEFT HEART CATH AND CORONARY ANGIOGRAPHY
Anesthesia: LOCAL

## 2015-07-16 MED ORDER — LIDOCAINE HCL (PF) 1 % IJ SOLN
INTRAMUSCULAR | Status: DC | PRN
  Administered 2015-07-16: 17 mL

## 2015-07-16 MED ORDER — HEPARIN (PORCINE) IN NACL 2-0.9 UNIT/ML-% IJ SOLN
INTRAMUSCULAR | Status: AC
  Filled 2015-07-16: qty 1000

## 2015-07-16 MED ORDER — FENTANYL CITRATE (PF) 100 MCG/2ML IJ SOLN
INTRAMUSCULAR | Status: AC
  Filled 2015-07-16: qty 2

## 2015-07-16 MED ORDER — SODIUM CHLORIDE 0.9% FLUSH: 3.0000 mL | INTRAVENOUS | Status: DC | PRN

## 2015-07-16 MED ORDER — IOPAMIDOL (ISOVUE-370) INJECTION 76%
INTRAVENOUS | Status: DC | PRN
  Administered 2015-07-16: 45 mL via INTRAVENOUS

## 2015-07-16 MED ORDER — FENTANYL CITRATE (PF) 100 MCG/2ML IJ SOLN
INTRAMUSCULAR | Status: DC | PRN
  Administered 2015-07-16: 25 ug via INTRAVENOUS

## 2015-07-16 MED ORDER — SODIUM CHLORIDE 0.9 % IV SOLN: INTRAVENOUS | Status: DC

## 2015-07-16 MED ORDER — HEPARIN (PORCINE) IN NACL 2-0.9 UNIT/ML-% IJ SOLN
INTRAMUSCULAR | Status: DC | PRN
  Administered 2015-07-16: 1000 mL

## 2015-07-16 MED ORDER — SODIUM CHLORIDE 0.9 % IV SOLN: 250.0000 mL | INTRAVENOUS | Status: DC | PRN

## 2015-07-16 MED ORDER — MIDAZOLAM HCL 2 MG/2ML IJ SOLN
INTRAMUSCULAR | Status: DC | PRN
  Administered 2015-07-16: 1 mg via INTRAVENOUS

## 2015-07-16 MED ORDER — LIDOCAINE HCL (PF) 1 % IJ SOLN
INTRAMUSCULAR | Status: AC
  Filled 2015-07-16: qty 30

## 2015-07-16 MED ORDER — SODIUM CHLORIDE 0.9% FLUSH
3.0000 mL | Freq: Two times a day (BID) | INTRAVENOUS | Status: DC
  Administered 2015-07-16 – 2015-07-17 (×2): 3 mL via INTRAVENOUS

## 2015-07-16 MED ORDER — MIDAZOLAM HCL 2 MG/2ML IJ SOLN
INTRAMUSCULAR | Status: AC
  Filled 2015-07-16: qty 2

## 2015-07-16 MED ORDER — ZOLPIDEM TARTRATE 5 MG PO TABS
5.0000 mg | ORAL_TABLET | Freq: Once | ORAL | Status: AC
  Administered 2015-07-16: 5 mg via ORAL
  Filled 2015-07-16: qty 1

## 2015-07-16 SURGICAL SUPPLY — 7 items
CATH INFINITI 5FR MULTPACK ANG (CATHETERS) ×2 IMPLANT
KIT HEART LEFT (KITS) ×2 IMPLANT
PACK CARDIAC CATHETERIZATION (CUSTOM PROCEDURE TRAY) ×2 IMPLANT
SHEATH PINNACLE 5F 10CM (SHEATH) ×2 IMPLANT
SYR MEDRAD MARK V 150ML (SYRINGE) ×2 IMPLANT
TRANSDUCER W/STOPCOCK (MISCELLANEOUS) ×2 IMPLANT
WIRE EMERALD 3MM-J .035X150CM (WIRE) ×2 IMPLANT

## 2015-07-16 NOTE — Progress Notes (Signed)
Site area: rt groin Site Prior to Removal:  Level 0 Pressure Applied For:  20 minutes  Manual:   yes Patient Status During Pull:  stable Post Pull Site:  Level  0 Post Pull Instructions Given:  yes Post Pull Pulses Present:  yes Dressing Applied:  tegaderm Bedrest begins @ 1155 Comments:

## 2015-07-16 NOTE — Interval H&P Note (Signed)
History and Physical Interval Note:  07/16/2015 10:39 AM  [name] [name]  has presented today for surgery, with the diagnosis of Chest pain  The various methods of treatment have been discussed with the patient and family. After consideration of risks, benefits and other options for treatment, the patient has consented to  Procedure(s): Left Heart Cath and Coronary Angiography (N/A) as a surgical intervention .  The patient's history has been reviewed, patient examined, no change in status, stable for surgery.  I have reviewed the patient's chart and labs.  Questions were answered to the patient's satisfaction.     [name]

## 2015-07-16 NOTE — Progress Notes (Signed)
ANTICOAGULATION CONSULT NOTE - Follow-up Consult  Pharmacy Consult for Heparin  Indication: chest pain/ACS  Allergies  Allergen Reactions  . Amiodarone Other (See Comments)    Hyperthyroidism   . Diltiazem Other (See Comments)    Put into hospital for 3 days  . Macrobid [Nitrofurantoin Monohyd Macro] Nausea Only  . Aspirin Rash    Tolerates low dose aspirin  . Naproxen Sodium Swelling and Rash  . Sulfa Drugs Cross Reactors Other (See Comments)    Allergy per mother - unknown reaction    Patient Measurements: Height: 5' 6.5" (168.9 cm) Weight: 118 lb 6.4 oz (53.706 kg) IBW/kg (Calculated) : 60.45 Heparin Dosing Weight: 53 kg   Vital Signs: Temp: 97.8 F (36.6 C) (03/21 2025) Temp Source: Oral (03/21 2025) BP: 122/48 mmHg (03/21 2025) Pulse Rate: 83 (03/21 2025)  Labs:  Recent Labs  07/15/15 1845 07/15/15 2302 07/16/15 0255  HGB 11.5*  --   --   HCT 35.5*  --   --   PLT 214  --   --   HEPARINUNFRC  --   --  0.23*  CREATININE 0.81  --   --   TROPONINI <0.03 <0.03  --     Estimated Creatinine Clearance: 52.4 mL/min (by C-G formula based on Cr of 0.81).  Assessment: Pharmacy consulted to start IV heparin in 60 YOF for ACS who presented with nausea, back pain and arm numbness. Heparin level 0.23 (subtherapeutic) on 600 units/hr. No issues with line or bleeding reported per RN.  Goal of Therapy:  Heparin level 0.3-0.7 units/ml Monitor platelets by anticoagulation protocol: Yes   Plan: Increase heparin gtt to 700 units/hr F/u 8 hr HL  Christoper Fabian, PharmD, BCPS Clinical pharmacist, pager [phone] 07/16/2015 3:36 AM

## 2015-07-16 NOTE — Progress Notes (Signed)
To cath lab now, ccmd notified of Tele off for now  Governor Specking RN

## 2015-07-16 NOTE — Progress Notes (Signed)
  Echocardiogram 2D Echocardiogram has been performed.  Janalyn Harder 07/16/2015, 1:59 PM

## 2015-07-16 NOTE — Progress Notes (Signed)
@  1215pm  Patient arrived back to room from the cath lab.  Alert & oriented.  Right femoral dressing dry & intact B/P 122/50  HR 60 NSR  O2 sat 100%   Offers no complaints of pain at this time.  [name] [name]

## 2015-07-17 ENCOUNTER — Inpatient Hospital Stay (HOSPITAL_COMMUNITY): Payer: PPO

## 2015-07-17 DIAGNOSIS — M47812 Spondylosis without myelopathy or radiculopathy, cervical region: Secondary | ICD-10-CM | POA: Diagnosis not present

## 2015-07-17 LAB — CBC
HEMATOCRIT: 31.6 % — AB (ref 36.0–46.0)
Hemoglobin: 10.5 g/dL — ABNORMAL LOW (ref 12.0–15.0)
MCH: 30 pg (ref 26.0–34.0)
MCHC: 33.2 g/dL (ref 30.0–36.0)
MCV: 90.3 fL (ref 78.0–100.0)
PLATELETS: 189 10*3/uL (ref 150–400)
RBC: 3.5 MIL/uL — AB (ref 3.87–5.11)
RDW: 13.7 % (ref 11.5–15.5)
WBC: 6.4 10*3/uL (ref 4.0–10.5)

## 2015-07-17 MED ORDER — ZOLPIDEM TARTRATE 5 MG PO TABS
5.0000 mg | ORAL_TABLET | Freq: Once | ORAL | Status: AC
  Administered 2015-07-17: 5 mg via ORAL
  Filled 2015-07-17: qty 1

## 2015-07-17 MED ORDER — FUROSEMIDE 20 MG PO TABS
20.0000 mg | ORAL_TABLET | Freq: Every day | ORAL | Status: DC
  Administered 2015-07-17 – 2015-07-18 (×2): 20 mg via ORAL
  Filled 2015-07-17 (×2): qty 1

## 2015-07-17 MED ORDER — RAMIPRIL 2.5 MG PO CAPS
2.5000 mg | ORAL_CAPSULE | Freq: Every day | ORAL | Status: DC
  Administered 2015-07-17 – 2015-07-18 (×2): 2.5 mg via ORAL
  Filled 2015-07-17 (×4): qty 1

## 2015-07-17 MED ORDER — CARVEDILOL 3.125 MG PO TABS
3.1250 mg | ORAL_TABLET | Freq: Two times a day (BID) | ORAL | Status: DC
  Administered 2015-07-18 (×2): 3.125 mg via ORAL
  Filled 2015-07-17 (×2): qty 1

## 2015-07-17 NOTE — Clinical Documentation Improvement (Signed)
Cardiology Please update your documentation within the medical record to reflect your response to this query. Thank you  Can the diagnosis of CHF be further specified?    Acuity - Acute, Chronic, Acute on Chronic   Type - Systolic, Diastolic, Systolic and Diastolic  Other  Clinically Undetermined  Document any associated diagnoses/conditions  Supporting Information: 07/15/15 H&P.Marland KitchenMarland Kitchen"Hypertension"...".CHF (congestive heart failure) (HCC)".Marland KitchenMarland KitchenAdmit/Echocardiogram/Possible cardiac cath or nuclear stress test post cycling Troponin-I.".Marland KitchenMarland Kitchen Results for [name], [name] (MRN [id number]) as of 07/17/2015 11:17  07/15/2015 18:40  B Natriuretic Peptide 471.3 (H)   Please exercise your independent, professional judgment when responding. A specific answer is not anticipated or expected.  Thank You,  Toribio Harbour, RN, BSN, CCDS Certified Clinical Documentation Specialist [place]: Health Information Management [phone]

## 2015-07-17 NOTE — Progress Notes (Signed)
Ref: ARONSON,RICHARD A, MD   Subjective:  Weakness and right sided numbness at times.  Normal coronaries but EKG changes of antero-lateral ischemia persist.  Echocardiogram showed preserved LV systolic function with moderate MS and without mitral regurgitation.  MRI brain showed small lacunar infarct in left thalamus and small chronic micro hemorrhages in cerebellum and bilateral occipital regions.  C-spine x-ray showed diffuse osteopenia and degenerative changes and right paratracheal mass as right thyroid lobe mass as identified on thyroid ultrasound on 08/23/2012.  Objective:  Vital Signs in the last 24 hours: Temp:  [98.3 F (36.8 C)] 98.3 F (36.8 C) (03/23 0706) Pulse Rate:  [66-75] 75 (03/23 1036) Cardiac Rhythm:  [-] Heart block (03/23 0700) Resp:  [18] 18 (03/23 0706) BP: (115-118)/(45-52) 115/52 mmHg (03/23 1036) SpO2:  [100 %] 100 % (03/23 0706) Weight:  [54.477 kg (120 lb 1.6 oz)] 54.477 kg (120 lb 1.6 oz) (03/23 0706)  Physical Exam: BP Readings from Last 1 Encounters:  07/17/15 115/52    Wt Readings from Last 1 Encounters:  07/17/15 54.477 kg (120 lb 1.6 oz)    Weight change:   HEENT: [place]/AT, Eyes-Brown, PERL, EOMI, Conjunctiva-Pink, Sclera-Non-icteric Neck: No JVD, No bruit, Trachea midline. Right thyroid lobe and mass palable, soft and mobile and non-tender. Lungs:  Clear, Bilateral. Cardiac:  Regular rhythm, normal S1 and S2, no S3.  Abdomen:  Soft, non-tender. Extremities:  No edema present. No cyanosis. No clubbing. CNS: AxOx3, Cranial nerves grossly intact, moves all 4 extremities. Right handed. Skin: Warm and dry.   Intake/Output from previous day:      Lab Results: BMET    Component Value Date/Time   NA 139 07/16/2015 0557   NA 138 07/15/2015 1845   NA 135 11/02/2014 1316   K 4.0 07/16/2015 0557   K 4.1 07/15/2015 1845   K 4.3 11/02/2014 1316   CL 105 07/16/2015 0557   CL 103 07/15/2015 1845   CL 98 11/02/2014 1316   CO2 25 07/16/2015 0557    CO2 25 07/15/2015 1845   CO2 25 11/02/2014 1316   GLUCOSE 94 07/16/2015 0557   GLUCOSE 94 07/15/2015 1845   GLUCOSE 155* 11/02/2014 1316   BUN 10 07/16/2015 0557   BUN 11 07/15/2015 1845   BUN 10 11/02/2014 1316   CREATININE 0.73 07/16/2015 0557   CREATININE 0.81 07/15/2015 1845   CREATININE 0.73 11/02/2014 1316   CREATININE 0.82 09/22/2014 1843   CREATININE 0.73 09/22/2014 1615   CALCIUM 9.9 07/16/2015 0557   CALCIUM 10.2 07/15/2015 1845   CALCIUM 9.7 11/02/2014 1316   GFRNONAA >60 07/16/2015 0557   GFRNONAA >60 07/15/2015 1845   GFRNONAA >60 09/22/2014 1843   GFRNONAA 83 09/22/2014 1615   GFRAA >60 07/16/2015 0557   GFRAA >60 07/15/2015 1845   GFRAA >60 09/22/2014 1843   GFRAA >89 09/22/2014 1615   CBC    Component Value Date/Time   WBC 6.4 07/17/2015 0330   WBC 9.6 09/22/2014 1617   RBC 3.50* 07/17/2015 0330   RBC 4.73 09/22/2014 1617   RBC 3.18* 08/20/2012 0810   HGB 10.5* 07/17/2015 0330   HGB 13.5 09/22/2014 1617   HCT 31.6* 07/17/2015 0330   HCT 40.9 09/22/2014 1617   PLT 189 07/17/2015 0330   MCV 90.3 07/17/2015 0330   MCV 86.5 09/22/2014 1617   MCH 30.0 07/17/2015 0330   MCH 28.5 09/22/2014 1617   MCHC 33.2 07/17/2015 0330   MCHC 32.9 09/22/2014 1617   RDW 13.7 07/17/2015 0330  LYMPHSABS 2.2 07/15/2015 1845   MONOABS 0.7 07/15/2015 1845   EOSABS 0.1 07/15/2015 1845   BASOSABS 0.0 07/15/2015 1845   HEPATIC Function Panel  Recent Labs  09/22/14 1843 11/02/14 1316 07/15/15 1845  PROT 7.2 6.9 6.5   HEMOGLOBIN A1C No components found for: HGA1C,  MPG CARDIAC ENZYMES Lab Results  Component Value Date   CKTOTAL 13 08/20/2012   CKMB 1.3 08/20/2012   TROPONINI <0.03 07/16/2015   TROPONINI <0.03 07/15/2015   TROPONINI <0.03 07/15/2015   BNP No results for input(s): PROBNP in the last 8760 hours. TSH No results for input(s): TSH in the last 8760 hours. CHOLESTEROL  Recent Labs  07/16/15 0557  CHOL 226*    Scheduled Meds: .  acidophilus  1 capsule Oral Daily  . [START ON 07/18/2015] carvedilol  3.125 mg Oral BID WC  . cholecalciferol  2,000 Units Oral BID  . furosemide  20 mg Oral Daily  . methimazole  5 mg Oral Daily  . pantoprazole  40 mg Oral q morning - 10a  . ramipril  2.5 mg Oral Daily  . sodium chloride flush  3 mL Intravenous Q12H  . traMADol  50 mg Oral Q12H  . 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 Add small dose ramipril and small dose lasix as tolerated. May need TEE in future   LOS: 2 days    Orpah Cobb  MD  07/17/2015, 6:57 PM

## 2015-07-18 ENCOUNTER — Encounter (HOSPITAL_COMMUNITY): Payer: Self-pay

## 2015-07-18 MED ORDER — RAMIPRIL 2.5 MG PO CAPS: 2.5000 mg | ORAL_CAPSULE | Freq: Every day | ORAL | Status: DC

## 2015-07-18 MED ORDER — TRAMADOL HCL 50 MG PO TABS: 25.0000 mg | ORAL_TABLET | Freq: Four times a day (QID) | ORAL | Status: DC | PRN

## 2015-07-18 MED ORDER — CARVEDILOL 3.125 MG PO TABS: 3.1250 mg | ORAL_TABLET | Freq: Two times a day (BID) | ORAL | Status: DC

## 2015-07-18 MED ORDER — FUROSEMIDE 20 MG PO TABS: 20.0000 mg | ORAL_TABLET | ORAL | Status: DC

## 2015-07-18 NOTE — Discharge Summary (Signed)
Physician Discharge Summary  Patient ID: [name] [name] MRN: [id number] DOB/AGE: [birth year] 75 y.o.  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 No fever, cough or chest pain. She has PMH of mitral valve repair by Dr. Barry Dienes in 06/2012. EKG done by Dr. Verl Dicker office showed anterior ischemia. She underwent cardiac cath showing normal coronaries. Her echocardiogram showed restricted mobility of her MV with moderate stenosis and mild pulmonary hypertension. She may undergo TEE when pulmonary hypertension increases or has more symptoms post vasodilator therapy.  Her MRI brain showed small lacunar infarct in left thalamus and small chronic micro hemorrhages in cerebellum and bilateral occipital regions. She agreed to use cane for ambulation for weakness and balance. Her C-spine x-ray showed diffuse osteopenia and degenerative changes and right paratracheal mass as right thyroid lobe mass as identified on thyroid ultrasound on 08/23/2012. Small dose lasix, coreg and ramipril were added and she was discharged home with follow up by primary care and Dr. Yates Decamp in 2 to 4 weeks.   Consults: cardiology  Significant Diagnostic Studies: labs: Near normal CBC with mildly low Hgb of  11.5. Normal troponin-I. Normal BMET.  EKG showed anterolateral ischemia.  Echocardiogram:  - Left ventricle: The cavity size was normal. There was mild concentric hypertrophy. Systolic function was normal. Wall motion  was normal; there were no regional wall motion abnormalities. - Mitral valve: An annular ring prosthesis was present. Mobility was restricted. Transvalvular velocity was increased, due to  stenosis. The findings are consistent with moderate stenosis. Valve area by pressure half-time: 1.47 cm^2. - Left atrium: The atrium was moderately dilated. - Right atrium: The atrium was moderately dilated. - Pulmonary arteries: Systolic pressure was mildly increased. PA peak pressure: 39 mm Hg (S). - Pericardium, extracardiac: There was a right pleural effusion.  MRI brain showed small lacunar infarct in left thalamus and small chronic micro hemorrhages in cerebellum and bilateral occipital regions.   C-spine x-ray showed diffuse osteopenia and degenerative changes and right paratracheal mass as right thyroid lobe mass as identified on thyroid ultrasound on 08/23/2012.  Treatments: cardiac meds: ramipril (Altace), carvedilol and furosemide  Discharge Exam: Blood pressure 108/59, pulse 60, temperature 97.8 F (36.6 C), temperature source Oral, resp. rate 19, height 5' 6.5" (1.689 m), weight 52.3 kg (115 lb 4.8 oz), SpO2 100 %. HEENT: /AT, Eyes-Brown, PERL, EOMI, Conjunctiva-Pink, Sclera-Non-icteric Neck: No JVD, No bruit, Trachea midline. Right thyroid lobe and mass palable, soft and mobile and non-tender. Lungs: Clear, Bilateral. Cardiac: Regular rhythm, normal S1 and S2, no S3.  Abdomen: Soft, non-tender. Extremities: No edema present. No cyanosis. No clubbing. CNS: AxOx3, Cranial nerves grossly intact, moves all 4 extremities. Right handed. Skin: Warm and dry.  Disposition: 01-Home or Self Care     Medication List    STOP taking these medications  atenolol  25 MG tablet  Commonly known as:  TENORMIN     multivitamin with minerals Tabs tablet      TAKE these medications        acetaminophen 500 MG tablet  Commonly known as:  TYLENOL  Take 500 mg by mouth every 6 (six) hours as needed (pain).     ALPRAZolam 0.25 MG tablet  Commonly known as:  XANAX  Take 0.125-0.25 mg by mouth 2 (two) times daily as needed for anxiety. For anxiety     aspirin EC 81 MG tablet  Take 81 mg by mouth daily at 2 PM.     b complex vitamins tablet  Take 1 tablet by mouth daily before lunch.     carvedilol 3.125 MG tablet  Commonly known as:  COREG  Take 1 tablet (3.125 mg total) by mouth 2 (two) times daily with a meal.     FISH OIL PO  Take 1 capsule by mouth daily before lunch.     fluticasone 50 MCG/ACT nasal spray  Commonly known as:  FLONASE  Place 2 sprays into the nose daily as needed for allergies.     furosemide 20 MG tablet  Commonly known as:  LASIX  Take 1 tablet (20 mg total) by mouth every Monday, Wednesday, and Friday.     methimazole 5 MG tablet  Commonly known as:  TAPAZOLE  Take 5 mg by mouth daily.     OVER THE COUNTER MEDICATION  Place 1 drop into both eyes daily as needed (dry eyes/ irritation).     pantoprazole 40 MG tablet  Commonly known as:  PROTONIX  Take 40 mg by mouth daily.     PROBIOTIC DAILY Caps  Take 1 capsule by mouth daily before lunch.     ramipril 2.5 MG capsule  Commonly known as:  ALTACE  Take 1 capsule (2.5 mg total) by mouth daily.     traMADol 50 MG tablet  Commonly known as:  ULTRAM  Take 0.5 tablets (25 mg total) by mouth every 6 (six) hours as needed (pain).     VITAMIN B-12 PO  Take 1 tablet by mouth daily before lunch.     Vitamin D3 2000 units Tabs  Take 2,000 Units by mouth daily before lunch.           Follow-up Information    Follow up with ARONSON,RICHARD A, MD. Schedule an appointment as soon as possible for a visit in 1 month.   Specialty:  Internal Medicine   Contact  information:   [address] [place] [address] [phone]       Follow up with Yates Decamp, MD. Schedule an appointment as soon as possible for a visit in 2 weeks.   Specialty:  Cardiology   Contact information:   [address] [place] [address] [phone]       Signed: Ricki Rodriguez 07/18/2015, 5:19 PM

## 2015-07-18 NOTE — Care Management Important Message (Signed)
Important Message  Patient Details  Name: [name] [name] MRN: [id number] Date of Birth: 06/18/1940   Medicare Important Message Given:  Yes    Oralia Rud [name] 07/18/2015, 12:31 PM

## 2015-07-22 ENCOUNTER — Other Ambulatory Visit: Payer: Self-pay | Admitting: Cardiology

## 2015-07-22 DIAGNOSIS — R0602 Shortness of breath: Secondary | ICD-10-CM

## 2015-07-25 ENCOUNTER — Ambulatory Visit
Admission: RE | Admit: 2015-07-25 | Discharge: 2015-07-25 | Disposition: A | Discharge status: Home / Self Care | Payer: PPO | Source: Ambulatory Visit | Attending: Cardiology | Admitting: Cardiology

## 2015-07-25 DIAGNOSIS — R0602 Shortness of breath: Secondary | ICD-10-CM

## 2015-07-25 MED ORDER — IOPAMIDOL (ISOVUE-370) INJECTION 76%
80.0000 mL | Freq: Once | INTRAVENOUS | Status: AC | PRN
  Administered 2015-07-25: 80 mL via INTRAVENOUS

## 2015-07-28 DIAGNOSIS — Z8679 Personal history of other diseases of the circulatory system: Secondary | ICD-10-CM | POA: Diagnosis not present

## 2015-07-28 DIAGNOSIS — R9431 Abnormal electrocardiogram [ECG] [EKG]: Secondary | ICD-10-CM | POA: Diagnosis not present

## 2015-07-28 DIAGNOSIS — E064 Drug-induced thyroiditis: Secondary | ICD-10-CM | POA: Diagnosis not present

## 2015-07-28 DIAGNOSIS — R0602 Shortness of breath: Secondary | ICD-10-CM | POA: Diagnosis not present

## 2015-08-14 DIAGNOSIS — R9431 Abnormal electrocardiogram [ECG] [EKG]: Secondary | ICD-10-CM | POA: Diagnosis not present

## 2015-08-14 DIAGNOSIS — E0500 Thyrotoxicosis with diffuse goiter without thyrotoxic crisis or storm: Secondary | ICD-10-CM | POA: Diagnosis not present

## 2015-08-14 DIAGNOSIS — I5032 Chronic diastolic (congestive) heart failure: Secondary | ICD-10-CM | POA: Diagnosis not present

## 2015-08-14 DIAGNOSIS — I342 Nonrheumatic mitral (valve) stenosis: Secondary | ICD-10-CM | POA: Diagnosis not present

## 2015-08-14 DIAGNOSIS — R42 Dizziness and giddiness: Secondary | ICD-10-CM | POA: Diagnosis not present

## 2015-08-27 DIAGNOSIS — E7800 Pure hypercholesterolemia, unspecified: Secondary | ICD-10-CM | POA: Diagnosis not present

## 2015-08-27 DIAGNOSIS — R9431 Abnormal electrocardiogram [ECG] [EKG]: Secondary | ICD-10-CM | POA: Diagnosis not present

## 2015-08-27 DIAGNOSIS — R0602 Shortness of breath: Secondary | ICD-10-CM | POA: Diagnosis not present

## 2015-08-27 DIAGNOSIS — I10 Essential (primary) hypertension: Secondary | ICD-10-CM | POA: Diagnosis not present

## 2015-09-03 DIAGNOSIS — M545 Low back pain: Secondary | ICD-10-CM | POA: Diagnosis not present

## 2015-09-03 DIAGNOSIS — F1320 Sedative, hypnotic or anxiolytic dependence, uncomplicated: Secondary | ICD-10-CM | POA: Diagnosis not present

## 2015-09-03 DIAGNOSIS — E784 Other hyperlipidemia: Secondary | ICD-10-CM | POA: Diagnosis not present

## 2015-09-03 DIAGNOSIS — I5020 Unspecified systolic (congestive) heart failure: Secondary | ICD-10-CM | POA: Diagnosis not present

## 2015-09-03 DIAGNOSIS — F419 Anxiety disorder, unspecified: Secondary | ICD-10-CM | POA: Diagnosis not present

## 2015-09-03 DIAGNOSIS — J90 Pleural effusion, not elsewhere classified: Secondary | ICD-10-CM | POA: Diagnosis not present

## 2015-09-03 DIAGNOSIS — R0602 Shortness of breath: Secondary | ICD-10-CM | POA: Diagnosis not present

## 2015-09-03 DIAGNOSIS — M810 Age-related osteoporosis without current pathological fracture: Secondary | ICD-10-CM | POA: Diagnosis not present

## 2015-09-03 DIAGNOSIS — I480 Paroxysmal atrial fibrillation: Secondary | ICD-10-CM | POA: Diagnosis not present

## 2015-09-03 DIAGNOSIS — I341 Nonrheumatic mitral (valve) prolapse: Secondary | ICD-10-CM | POA: Diagnosis not present

## 2015-09-03 DIAGNOSIS — E0590 Thyrotoxicosis, unspecified without thyrotoxic crisis or storm: Secondary | ICD-10-CM | POA: Diagnosis not present

## 2015-09-03 DIAGNOSIS — Z681 Body mass index (BMI) 19 or less, adult: Secondary | ICD-10-CM | POA: Diagnosis not present

## 2015-10-17 ENCOUNTER — Encounter (HOSPITAL_COMMUNITY): Payer: Self-pay | Admitting: *Deleted

## 2015-11-07 ENCOUNTER — Emergency Department (HOSPITAL_COMMUNITY): Payer: PPO

## 2015-11-07 ENCOUNTER — Encounter (HOSPITAL_COMMUNITY): Payer: Self-pay | Admitting: Emergency Medicine

## 2015-11-07 ENCOUNTER — Emergency Department (HOSPITAL_COMMUNITY)
Admission: EM | Admit: 2015-11-07 | Discharge: 2015-11-07 | Disposition: A | Discharge status: Home / Self Care | Payer: PPO | Attending: Emergency Medicine | Admitting: Emergency Medicine

## 2015-11-07 DIAGNOSIS — Z951 Presence of aortocoronary bypass graft: Secondary | ICD-10-CM | POA: Insufficient documentation

## 2015-11-07 DIAGNOSIS — I110 Hypertensive heart disease with heart failure: Secondary | ICD-10-CM | POA: Insufficient documentation

## 2015-11-07 DIAGNOSIS — R0602 Shortness of breath: Secondary | ICD-10-CM | POA: Diagnosis not present

## 2015-11-07 DIAGNOSIS — Z7982 Long term (current) use of aspirin: Secondary | ICD-10-CM | POA: Insufficient documentation

## 2015-11-07 DIAGNOSIS — R509 Fever, unspecified: Secondary | ICD-10-CM | POA: Diagnosis not present

## 2015-11-07 DIAGNOSIS — I509 Heart failure, unspecified: Secondary | ICD-10-CM | POA: Insufficient documentation

## 2015-11-07 DIAGNOSIS — R399 Unspecified symptoms and signs involving the genitourinary system: Secondary | ICD-10-CM | POA: Diagnosis not present

## 2015-11-07 DIAGNOSIS — I2510 Atherosclerotic heart disease of native coronary artery without angina pectoris: Secondary | ICD-10-CM | POA: Insufficient documentation

## 2015-11-07 DIAGNOSIS — R110 Nausea: Secondary | ICD-10-CM | POA: Diagnosis not present

## 2015-11-07 DIAGNOSIS — K029 Dental caries, unspecified: Secondary | ICD-10-CM | POA: Diagnosis not present

## 2015-11-07 DIAGNOSIS — N390 Urinary tract infection, site not specified: Secondary | ICD-10-CM | POA: Insufficient documentation

## 2015-11-07 LAB — COMPREHENSIVE METABOLIC PANEL
ALBUMIN: 3.7 g/dL (ref 3.5–5.0)
ALT: 14 U/L (ref 14–54)
ANION GAP: 11 (ref 5–15)
AST: 19 U/L (ref 15–41)
Alkaline Phosphatase: 68 U/L (ref 38–126)
BUN: 8 mg/dL (ref 6–20)
CHLORIDE: 99 mmol/L — AB (ref 101–111)
CO2: 23 mmol/L (ref 22–32)
Calcium: 10 mg/dL (ref 8.9–10.3)
Creatinine, Ser: 0.73 mg/dL (ref 0.44–1.00)
GFR calc Af Amer: >60 mL/min (ref >60)
GFR calc non Af Amer: >60 mL/min (ref >60)
GLUCOSE: 107 mg/dL — AB (ref 65–99)
POTASSIUM: 3.9 mmol/L (ref 3.5–5.1)
SODIUM: 133 mmol/L — AB (ref 135–145)
TOTAL PROTEIN: 6.7 g/dL (ref 6.5–8.1)
Total Bilirubin: 1.1 mg/dL (ref 0.3–1.2)

## 2015-11-07 LAB — CBC WITH DIFFERENTIAL/PLATELET
BASOS PCT: 0 %
Basophils Absolute: 0 10*3/uL (ref 0.0–0.1)
EOS ABS: 0 10*3/uL (ref 0.0–0.7)
EOS PCT: 0 %
HCT: 37.4 % (ref 36.0–46.0)
HEMOGLOBIN: 12.2 g/dL (ref 12.0–15.0)
Lymphocytes Relative: 10 %
Lymphs Abs: 0.9 10*3/uL (ref 0.7–4.0)
MCH: 28.9 pg (ref 26.0–34.0)
MCHC: 32.6 g/dL (ref 30.0–36.0)
MCV: 88.6 fL (ref 78.0–100.0)
Monocytes Absolute: 0.4 10*3/uL (ref 0.1–1.0)
Monocytes Relative: 5 %
NEUTROS PCT: 85 %
Neutro Abs: 6.9 10*3/uL (ref 1.7–7.7)
PLATELETS: 206 10*3/uL (ref 150–400)
RBC: 4.22 MIL/uL (ref 3.87–5.11)
RDW: 13.6 % (ref 11.5–15.5)
WBC: 8.2 10*3/uL (ref 4.0–10.5)

## 2015-11-07 LAB — URINE MICROSCOPIC-ADD ON

## 2015-11-07 LAB — URINALYSIS, ROUTINE W REFLEX MICROSCOPIC
Bilirubin Urine: NEGATIVE
Glucose, UA: NEGATIVE mg/dL
KETONES UR: 15 mg/dL — AB
NITRITE: NEGATIVE
PH: 7.5 (ref 5.0–8.0)
Protein, ur: NEGATIVE mg/dL
Specific Gravity, Urine: 1.013 (ref 1.005–1.030)

## 2015-11-07 LAB — I-STAT CG4 LACTIC ACID, ED: Lactic Acid, Venous: 1.44 mmol/L (ref 0.5–1.9)

## 2015-11-07 MED ORDER — CEPHALEXIN 500 MG PO CAPS: 500.0000 mg | ORAL_CAPSULE | Freq: Two times a day (BID) | ORAL | Status: DC

## 2015-11-07 MED ORDER — ACETAMINOPHEN 325 MG PO TABS
650.0000 mg | ORAL_TABLET | Freq: Once | ORAL | Status: AC
  Administered 2015-11-07: 650 mg via ORAL
  Filled 2015-11-07: qty 2

## 2015-11-07 MED ORDER — CEFTRIAXONE SODIUM 1 G IJ SOLR
1.0000 g | INTRAMUSCULAR | Status: DC
  Administered 2015-11-07: 1 g via INTRAVENOUS
  Filled 2015-11-07: qty 10

## 2015-11-07 MED ORDER — SODIUM CHLORIDE 0.9 % IV SOLN
INTRAVENOUS | Status: DC
  Administered 2015-11-07: 08:00:00 via INTRAVENOUS

## 2015-11-07 NOTE — ED Provider Notes (Signed)
CSN: [id number]     Arrival date & time 11/07/15  7591 History   First MD Initiated Contact with Patient 11/07/15 [phone]     Chief Complaint  Patient presents with  . Nausea  . Dental Pain     (Consider location/radiation/quality/duration/timing/severity/associated sxs/prior Treatment) HPI: This is a 75 year old female who presents to ED via EMS for evaluation of nausea, myalgia and dental pain. The patient states she has had diffuse muscle pains for the past several days but were worse after her dental procedure yesterday. Pt had a molar extraction yesterday and was pre-treated with amoxicillin as patient has hx of mitral valve repair. The patient also reports she had a "sinus infection" last week that cleared up using flonase and also complains of some back pain and urinary symptoms. Denies any vomiting, diarrhea, chest pain or shortness of breath.   Past Medical History  Diagnosis Date  . Hypertension     Then hypotension 04/2012 limiting med adjustment.  . Diverticulitis   . Anxiety   . Mitral valve prolapse     a. H/o MVP, with severe MR 04/2012, had MV repair and Maze  . Hyperlipidemia     recently- taken off Crestor- due to pain in her legs, but this was after cardiac cath. , pt. questioning whether the pain in her legs was related to cath. or crestor  . Goiter   . GERD (gastroesophageal reflux disease)   . Aortic stenosis 05/19/2012    trivial by TEE  . Atrial fibrillation (HCC)     a. Dx 04/2012;  b. Amio d/c'd 2/2 hyperthyroidism;  c. Atrial flutter 10/2012;  d. On xarelto.  . Refusal of blood transfusions as patient is Jehovah's Witness   . Pneumonia     treated /w antibiotic- [facility]- early Feb. 2014  . S/P Maze operation for atrial fibrillation 06/28/2012    Complete bilateral atrial lesion set using cryothermy ablation with oversewing of LA appendage via right minithoracotomy  . S/P mitral valve repair 06/28/2012    Complex valvuloplasty including quadrangular resection of  posterior leaflet, sliding leaflet plasty, artificial Gore-tex neocord placement x2 and 56mm Sorin Memo 3D ring annuloplasty via right mini thoracotomy   . 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 Microscopic hematuria 08/20/2012  . Shortness of breath   . Heart murmur   . CHF (congestive heart failure) (HCC)   . Anemia   . Coronary artery disease   . Acute coronary syndrome (HCC) 06/2015  . Complication of anesthesia   . PONV (postoperative nausea and vomiting)   . Headache    Past Surgical History  Procedure Laterality Date  . Uterine fibroid surgery  1988  . Ovarian cyst surgery  1988  . Tubal ligation  1974  . Tee without cardioversion  05/19/2012    Procedure: TRANSESOPHAGEAL ECHOCARDIOGRAM (TEE);  Surgeon: Dolores Patty, MD;  Location: [facility] ENDOSCOPY;  Service: Cardiovascular;  Laterality: N/A;  . Mitral valve repair Right 06/28/2012    Procedure: MINIMALLY INVASIVE MITRAL VALVE REPAIR (MVR);  Surgeon: Purcell Nails, MD;  Location: [facility] OR;  Service: Open Heart Surgery;  Laterality: Right;  . Minimally invasive maze procedure N/A 06/28/2012    Procedure: MINIMALLY INVASIVE MAZE PROCEDURE;  Surgeon: Purcell Nails, MD;  Location: [facility] OR;  Service: Open Heart Surgery;  Laterality: N/A;  . Intraoperative transesophageal echocardiogram N/A 06/28/2012    Procedure: INTRAOPERATIVE TRANSESOPHAGEAL ECHOCARDIOGRAM;  Surgeon: Purcell Nails, MD;  Location: [facility] OR;  Service: Open Heart Surgery;  Laterality: N/A;  . Urethral stricture dilatation    . Coronary artery bypass graft    . Left and right heart catheterization with coronary angiogram N/A 05/22/2012    Procedure: LEFT AND RIGHT HEART CATHETERIZATION WITH CORONARY ANGIOGRAM;  Surgeon: Kathleene Hazel, MD;  Location: [facility] CATH LAB;  Service: Cardiovascular;  Laterality: N/A;  . Cardiac catheterization N/A 07/16/2015    Procedure: Left Heart Cath and Coronary Angiography;  Surgeon: Orpah Cobb, MD;  Location: [facility] INVASIVE CV LAB;   Service: Cardiovascular;  Laterality: N/A;  . `     Family History  Problem Relation Age of Onset  . Colon cancer Father    Social History  Substance Use Topics  . Smoking status: Never Smoker   . Smokeless tobacco: Never Used  . Alcohol Use: No     Comment: 1 drink a month   OB History    No data available     Review of Systems  Constitutional: Positive for fever and chills.  HENT: Positive for dental problem. Negative for facial swelling.   Eyes: Negative for photophobia.  Respiratory: Negative for cough and shortness of breath.   Cardiovascular: Negative for chest pain.  Musculoskeletal: Positive for myalgias. Negative for neck stiffness.  Skin: Negative for rash.  Neurological: Positive for headaches.   Review of Systems  Constitutional: Positive for fever and chills.  HENT: Positive for dental problem. Negative for facial swelling.   Eyes: Negative for photophobia.  Respiratory: Negative for cough and shortness of breath.   Cardiovascular: Negative for chest pain.  Musculoskeletal: Positive for myalgias. Negative for neck stiffness.  Skin: Negative for rash.  Neurological: Positive for headaches.      Allergies  Amiodarone; Diltiazem; Macrobid; Aspirin; Naproxen sodium; and Sulfa drugs cross reactors  Home Medications   Prior to Admission medications   Medication Sig Start Date End Date Taking? Authorizing Provider  acetaminophen (TYLENOL) 500 MG tablet Take 500 mg by mouth every 6 (six) hours as needed (pain).     Historical Provider, MD  ALPRAZolam Prudy Feeler) 0.25 MG tablet Take 0.125-0.25 mg by mouth 2 (two) times daily as needed for anxiety. For anxiety    Historical Provider, MD  aspirin EC 81 MG tablet Take 81 mg by mouth daily at 2 PM.     Historical Provider, MD  b complex vitamins tablet Take 1 tablet by mouth daily before lunch.    Historical Provider, MD  carvedilol (COREG) 3.125 MG tablet Take 1 tablet (3.125 mg total) by mouth 2 (two) times daily with  a meal. 07/18/15   Orpah Cobb, MD  Cholecalciferol (VITAMIN D3) 2000 UNITS TABS Take 2,000 Units by mouth daily before lunch.     Historical Provider, MD  Cyanocobalamin (VITAMIN B-12 PO) Take 1 tablet by mouth daily before lunch.    Historical Provider, MD  fluticasone (FLONASE) 50 MCG/ACT nasal spray Place 2 sprays into the nose daily as needed for allergies.  12/08/12   Historical Provider, MD  furosemide (LASIX) 20 MG tablet Take 1 tablet (20 mg total) by mouth every Monday, Wednesday, and Friday. 07/18/15   Orpah Cobb, MD  methimazole (TAPAZOLE) 5 MG tablet Take 5 mg by mouth daily.    Historical Provider, MD  Omega-3 Fatty Acids (FISH OIL PO) Take 1 capsule by mouth daily before lunch.    Historical Provider, MD  OVER THE COUNTER MEDICATION Place 1 drop into both eyes daily as needed (dry eyes/ irritation).    Historical Provider, MD  pantoprazole (  PROTONIX) 40 MG tablet Take 40 mg by mouth daily.  08/24/12   Geoffry Paradise, MD  Probiotic Product (PROBIOTIC DAILY) CAPS Take 1 capsule by mouth daily before lunch.     Historical Provider, MD  ramipril (ALTACE) 2.5 MG capsule Take 1 capsule (2.5 mg total) by mouth daily. 07/18/15   Orpah Cobb, MD  traMADol (ULTRAM) 50 MG tablet Take 0.5 tablets (25 mg total) by mouth every 6 (six) hours as needed (pain). 07/18/15   Orpah Cobb, MD   BP 143/74 mmHg  Pulse 85  Temp(Src) 100.1 F (37.8 C) (Oral)  Resp 16  SpO2 100% Physical Exam  Constitutional: She appears well-developed and well-nourished.  HENT:  Head: Normocephalic and atraumatic.  Mouth/Throat: Dental caries present.  Eyes: Pupils are equal, round, and reactive to light.  Cardiovascular: Normal rate.   Occasional extrasystoles are present.  Pulmonary/Chest: No respiratory distress. She has no wheezes. She has no rales.  Abdominal: She exhibits no distension. There is no tenderness.  Neurological: She is alert.  Skin: Skin is warm and dry.  GU: Urinary symptoms  ED Course   Procedures (including critical care time) Labs Review Labs Reviewed  CULTURE, BLOOD (ROUTINE X 2)  CULTURE, BLOOD (ROUTINE X 2)  URINE CULTURE  CBC WITH DIFFERENTIAL/PLATELET  COMPREHENSIVE METABOLIC PANEL  URINALYSIS, ROUTINE W REFLEX MICROSCOPIC (NOT AT [facility])  I-STAT CG4 LACTIC ACID, ED    Imaging Review Dg Chest 2 View  11/07/2015  CLINICAL DATA:  Headache and fever since dental extraction yesterday, nausea, leg pain, history CHF, hypertension, prior heart surgery EXAM: CHEST  2 VIEW COMPARISON:  09/22/2014 chest radiograph; CT chest 07/25/2015, 08/20/2012, 07/07/2010 FINDINGS: Enlargement of cardiac silhouette post MVR. Atherosclerotic calcification aorta. Pulmonary vascularity normal. RIGHT superior mediastinal mass with deviation and compression of trachea RIGHT to LEFT, corresponding to known RIGHT thyroid mass/enlargement on prior CTs since 2012. Emphysematous changes compatible with COPD. No acute infiltrate, pleural effusion or pneumothorax. Diffuse osseous demineralization. IMPRESSION: Enlargement of cardiac silhouette post MVR. COPD changes without acute infiltrate. Stable RIGHT thyroid mass/enlargement with mass effect upon trachea. Aortic atherosclerosis. Suspected osteoporosis. Electronically Signed   By: Ulyses Southward M.D.   On: 11/07/2015 09:23   I have personally reviewed and evaluated these images and lab results as part of my medical decision-making.   EKG Interpretation None      MDM   Final diagnoses:  SOB (shortness of breath)  UTI (lower urinary tract infection)    Patient's UA showed evidence of UTI. Patient given 1gm Rocephin in ED and tolerated well. Patient will be discharged home on 7 day course of PO Keflex.      Farha Dano, DO 11/07/15 1120

## 2015-11-07 NOTE — ED Notes (Signed)
Patient transported to X-ray 

## 2015-11-07 NOTE — Discharge Instructions (Signed)
Antibiotic Medicine °Antibiotic medicines are used to treat infections caused by bacteria. They work by injuring or killing the bacteria that is making you sick. °HOW IS AN ANTIBIOTIC CHOSEN? °An antibiotic is chosen based on many factors. To help your health care provider choose one for you, tell your health care provider if: °· You have any allergies. °· You are pregnant or plan to get pregnant. °· You are breastfeeding. °· You are taking any medicines. These include over-the-counter medicines, prescription medicines, and herbal remedies. °· You have a medical condition or problem you have not already discussed. °Your health care provider will also consider: °· How often the medicine has to be taken. °· Common side effects of the medicine. °· The cost of the medicine. °· The taste of the medicine. °If you have questions about why an antibiotic was chosen, make sure to ask. °FOR HOW LONG SHOULD I TAKE MY ANTIBIOTIC? °Continue to take your antibiotic for as long as told by your health care provider. Do not stop taking it when you feel better. If you stop taking it too soon: °· You may start to feel sick again. °· Your infection may become harder to treat. °· Complications may develop. °WHAT IF I MISS A DOSE? °Try not to miss any doses of medicine. If you miss a dose, take it as soon as possible. However, if it is almost time for the next dose: °· If you are taking 2 doses per day, take the missed dose and the next dose 5 to 6 hours apart. °· If you are taking 3 or more doses per day, take the missed dose and the next dose 2 to 4 hours apart, then go back to the normal schedule. °If you cannot make up a missed dose, take the next scheduled dose on time. Then take the missed dose after you have taken all the doses as recommended by your health care provider, as if you had one more dose left. °DO ANTIBIOTICS AFFECT BIRTH CONTROL? °Birth control pills may not work while you are on antibiotics. If you are taking birth  control pills, continue taking them as usual and use a second form of birth control, such as a condom, to avoid unwanted pregnancy. Continue using the second form of birth control until you are finished with your current 1 month cycle of birth control pills. °OTHER INFORMATION °· If there is any medicine left over, throw it away. °· Never take someone else's antibiotics. °· Never take leftover antibiotics. °SEEK MEDICAL CARE IF: °· You get worse. °· You do not feel better within a few days of starting the antibiotic medicine. °· You vomit. °· White patches appear in your mouth. °· You have new joint pain that begins after starting the antibiotic. °· You have new muscle aches that begin after starting the antibiotic. °· You had a fever before starting the antibiotic and it returns. °· You have any symptoms of an allergic reaction, such as an itchy rash. If this happens, stop taking the antibiotic. °SEEK IMMEDIATE MEDICAL CARE IF: °· Your urine turns dark or becomes blood-colored. °· Your skin turns yellow. °· You bruise or bleed easily. °· You have severe diarrhea and abdominal cramps. °· You have a severe headache. °· You have signs of a severe allergic reaction, such as: °¨ Trouble breathing. °¨ Wheezing. °¨ Swelling of the lips, tongue, or face. °¨ Fainting. °¨ Blisters on the skin or in the mouth. °If you have signs of a severe allergic   reaction, stop taking the antibiotic right away. °  °This information is not intended to replace advice given to you by your health care provider. Make sure you discuss any questions you have with your health care provider. °  °Document Released: 12/24/2003 Document Revised: 01/01/2015 Document Reviewed: 08/28/2014 °Elsevier Interactive Patient Education ©2016 [employer] ° °

## 2015-11-07 NOTE — ED Notes (Addendum)
Pt arrives via [facility] for c/o nausea and pain after having a tooth pulled yesterday. Pt reports she took 4 amoxicillin yesterday prior to procedure. Pt a/o, nad. Pt received 4mg  zofran prior to arrival. Pt reports she took a tramadol at 0500 this am.

## 2015-11-07 NOTE — ED Provider Notes (Signed)
I saw and evaluated the patient, reviewed the resident's note and I agree with the findings and plan.   EKG Interpretation None     Patient here complaining of several days of myalgias was became worse after she had tooth extraction. Does have a history of mitral valve disease and was pretreated with amoxicillin. Denies any URI symptoms. Has had some urinary symptoms. Did take tramadol. Patient will have blood work here including urinalysis and chest x-ray to rule out bacteremia  Lorre Nick, MD 11/07/15 [phone]

## 2015-11-08 LAB — URINE CULTURE

## 2015-11-10 DIAGNOSIS — E042 Nontoxic multinodular goiter: Secondary | ICD-10-CM | POA: Diagnosis not present

## 2015-11-10 DIAGNOSIS — R3129 Other microscopic hematuria: Secondary | ICD-10-CM | POA: Diagnosis not present

## 2015-11-10 DIAGNOSIS — Z681 Body mass index (BMI) 19 or less, adult: Secondary | ICD-10-CM | POA: Diagnosis not present

## 2015-11-10 DIAGNOSIS — I480 Paroxysmal atrial fibrillation: Secondary | ICD-10-CM | POA: Diagnosis not present

## 2015-11-10 DIAGNOSIS — M545 Low back pain: Secondary | ICD-10-CM | POA: Diagnosis not present

## 2015-11-12 LAB — CULTURE, BLOOD (ROUTINE X 2)
Culture: NO GROWTH
Culture: NO GROWTH

## 2015-12-19 DIAGNOSIS — I10 Essential (primary) hypertension: Secondary | ICD-10-CM | POA: Diagnosis not present

## 2015-12-19 DIAGNOSIS — R0609 Other forms of dyspnea: Secondary | ICD-10-CM | POA: Diagnosis not present

## 2015-12-19 DIAGNOSIS — E0590 Thyrotoxicosis, unspecified without thyrotoxic crisis or storm: Secondary | ICD-10-CM | POA: Diagnosis not present

## 2015-12-19 DIAGNOSIS — Z681 Body mass index (BMI) 19 or less, adult: Secondary | ICD-10-CM | POA: Diagnosis not present

## 2015-12-19 DIAGNOSIS — R5383 Other fatigue: Secondary | ICD-10-CM | POA: Diagnosis not present

## 2016-01-19 DIAGNOSIS — H5315 Visual distortions of shape and size: Secondary | ICD-10-CM | POA: Diagnosis not present

## 2016-01-19 DIAGNOSIS — H2513 Age-related nuclear cataract, bilateral: Secondary | ICD-10-CM | POA: Diagnosis not present

## 2016-01-19 DIAGNOSIS — H40033 Anatomical narrow angle, bilateral: Secondary | ICD-10-CM | POA: Diagnosis not present

## 2016-01-19 DIAGNOSIS — H35372 Puckering of macula, left eye: Secondary | ICD-10-CM | POA: Diagnosis not present

## 2016-01-21 DIAGNOSIS — E0590 Thyrotoxicosis, unspecified without thyrotoxic crisis or storm: Secondary | ICD-10-CM | POA: Diagnosis not present

## 2016-01-21 DIAGNOSIS — R1032 Left lower quadrant pain: Secondary | ICD-10-CM | POA: Diagnosis not present

## 2016-01-21 DIAGNOSIS — Z681 Body mass index (BMI) 19 or less, adult: Secondary | ICD-10-CM | POA: Diagnosis not present

## 2016-01-21 DIAGNOSIS — I10 Essential (primary) hypertension: Secondary | ICD-10-CM | POA: Diagnosis not present

## 2016-01-21 DIAGNOSIS — H5452 Low vision, left eye, normal vision right eye: Secondary | ICD-10-CM | POA: Diagnosis not present

## 2016-01-21 DIAGNOSIS — R0609 Other forms of dyspnea: Secondary | ICD-10-CM | POA: Diagnosis not present

## 2016-01-21 DIAGNOSIS — Z23 Encounter for immunization: Secondary | ICD-10-CM | POA: Diagnosis not present

## 2016-01-22 ENCOUNTER — Other Ambulatory Visit (HOSPITAL_COMMUNITY): Payer: Self-pay | Admitting: Internal Medicine

## 2016-01-22 ENCOUNTER — Ambulatory Visit (HOSPITAL_COMMUNITY)
Admission: RE | Admit: 2016-01-22 | Discharge: 2016-01-22 | Disposition: A | Discharge status: Home / Self Care | Payer: PPO | Source: Ambulatory Visit | Attending: Vascular Surgery | Admitting: Vascular Surgery

## 2016-01-22 DIAGNOSIS — H539 Unspecified visual disturbance: Secondary | ICD-10-CM | POA: Diagnosis not present

## 2016-01-22 DIAGNOSIS — I6523 Occlusion and stenosis of bilateral carotid arteries: Secondary | ICD-10-CM | POA: Diagnosis not present

## 2016-01-22 LAB — VAS US CAROTID
LEFT ECA DIAS: -2 cm/s
LEFT VERTEBRAL DIAS: 17 cm/s
LICADDIAS: -33 cm/s
LICAPDIAS: -15 cm/s
LICAPSYS: -57 cm/s
Left CCA dist dias: 18 cm/s
Left CCA dist sys: 96 cm/s
Left ICA dist sys: -130 cm/s
RIGHT CCA MID DIAS: -17 cm/s
RIGHT ECA DIAS: -6 cm/s
RIGHT VERTEBRAL DIAS: -8 cm/s
Right cca dist sys: -100 cm/s

## 2016-03-15 DIAGNOSIS — M810 Age-related osteoporosis without current pathological fracture: Secondary | ICD-10-CM | POA: Diagnosis not present

## 2016-03-15 DIAGNOSIS — E0590 Thyrotoxicosis, unspecified without thyrotoxic crisis or storm: Secondary | ICD-10-CM | POA: Diagnosis not present

## 2016-03-15 DIAGNOSIS — I10 Essential (primary) hypertension: Secondary | ICD-10-CM | POA: Diagnosis not present

## 2016-03-15 DIAGNOSIS — E784 Other hyperlipidemia: Secondary | ICD-10-CM | POA: Diagnosis not present

## 2016-03-15 DIAGNOSIS — R8290 Unspecified abnormal findings in urine: Secondary | ICD-10-CM | POA: Diagnosis not present

## 2016-03-22 ENCOUNTER — Other Ambulatory Visit: Payer: Self-pay | Admitting: Internal Medicine

## 2016-03-22 DIAGNOSIS — E784 Other hyperlipidemia: Secondary | ICD-10-CM | POA: Diagnosis not present

## 2016-03-22 DIAGNOSIS — I480 Paroxysmal atrial fibrillation: Secondary | ICD-10-CM | POA: Diagnosis not present

## 2016-03-22 DIAGNOSIS — R9431 Abnormal electrocardiogram [ECG] [EKG]: Secondary | ICD-10-CM | POA: Diagnosis not present

## 2016-03-22 DIAGNOSIS — I341 Nonrheumatic mitral (valve) prolapse: Secondary | ICD-10-CM | POA: Diagnosis not present

## 2016-03-22 DIAGNOSIS — Z9889 Other specified postprocedural states: Secondary | ICD-10-CM | POA: Diagnosis not present

## 2016-03-22 DIAGNOSIS — E042 Nontoxic multinodular goiter: Secondary | ICD-10-CM

## 2016-03-22 DIAGNOSIS — I5020 Unspecified systolic (congestive) heart failure: Secondary | ICD-10-CM | POA: Diagnosis not present

## 2016-03-22 DIAGNOSIS — Z1389 Encounter for screening for other disorder: Secondary | ICD-10-CM | POA: Diagnosis not present

## 2016-03-22 DIAGNOSIS — F1320 Sedative, hypnotic or anxiolytic dependence, uncomplicated: Secondary | ICD-10-CM | POA: Diagnosis not present

## 2016-03-22 DIAGNOSIS — I10 Essential (primary) hypertension: Secondary | ICD-10-CM | POA: Diagnosis not present

## 2016-03-22 DIAGNOSIS — R0602 Shortness of breath: Secondary | ICD-10-CM | POA: Diagnosis not present

## 2016-03-22 DIAGNOSIS — M545 Low back pain: Secondary | ICD-10-CM | POA: Diagnosis not present

## 2016-03-22 DIAGNOSIS — M810 Age-related osteoporosis without current pathological fracture: Secondary | ICD-10-CM | POA: Diagnosis not present

## 2016-03-22 DIAGNOSIS — Z681 Body mass index (BMI) 19 or less, adult: Secondary | ICD-10-CM | POA: Diagnosis not present

## 2016-03-22 DIAGNOSIS — Z0000 Encounter for general adult medical examination without abnormal findings: Secondary | ICD-10-CM | POA: Diagnosis not present

## 2016-03-22 DIAGNOSIS — F419 Anxiety disorder, unspecified: Secondary | ICD-10-CM | POA: Diagnosis not present

## 2016-03-22 DIAGNOSIS — E0590 Thyrotoxicosis, unspecified without thyrotoxic crisis or storm: Secondary | ICD-10-CM | POA: Diagnosis not present

## 2016-04-13 DIAGNOSIS — Z1212 Encounter for screening for malignant neoplasm of rectum: Secondary | ICD-10-CM | POA: Diagnosis not present

## 2016-05-15 ENCOUNTER — Encounter (HOSPITAL_COMMUNITY): Payer: Self-pay

## 2016-05-15 ENCOUNTER — Inpatient Hospital Stay (HOSPITAL_COMMUNITY)
Admission: EM | Admit: 2016-05-15 | Discharge: 2016-05-26 | DRG: 336 | Disposition: A | Discharge status: Skilled Nursing Facility | Payer: PPO | Attending: Internal Medicine | Admitting: Internal Medicine

## 2016-05-15 ENCOUNTER — Observation Stay (HOSPITAL_COMMUNITY): Payer: PPO

## 2016-05-15 ENCOUNTER — Emergency Department (HOSPITAL_COMMUNITY): Payer: PPO

## 2016-05-15 DIAGNOSIS — K56609 Unspecified intestinal obstruction, unspecified as to partial versus complete obstruction: Secondary | ICD-10-CM | POA: Diagnosis present

## 2016-05-15 DIAGNOSIS — D500 Iron deficiency anemia secondary to blood loss (chronic): Secondary | ICD-10-CM | POA: Diagnosis present

## 2016-05-15 DIAGNOSIS — Z7982 Long term (current) use of aspirin: Secondary | ICD-10-CM | POA: Diagnosis not present

## 2016-05-15 DIAGNOSIS — R1110 Vomiting, unspecified: Secondary | ICD-10-CM | POA: Diagnosis not present

## 2016-05-15 DIAGNOSIS — I4891 Unspecified atrial fibrillation: Secondary | ICD-10-CM | POA: Diagnosis present

## 2016-05-15 DIAGNOSIS — I509 Heart failure, unspecified: Secondary | ICD-10-CM | POA: Diagnosis present

## 2016-05-15 DIAGNOSIS — E440 Moderate protein-calorie malnutrition: Secondary | ICD-10-CM | POA: Diagnosis not present

## 2016-05-15 DIAGNOSIS — E0590 Thyrotoxicosis, unspecified without thyrotoxic crisis or storm: Secondary | ICD-10-CM | POA: Diagnosis not present

## 2016-05-15 DIAGNOSIS — R008 Other abnormalities of heart beat: Secondary | ICD-10-CM | POA: Diagnosis not present

## 2016-05-15 DIAGNOSIS — Z79899 Other long term (current) drug therapy: Secondary | ICD-10-CM | POA: Diagnosis not present

## 2016-05-15 DIAGNOSIS — Z952 Presence of prosthetic heart valve: Secondary | ICD-10-CM

## 2016-05-15 DIAGNOSIS — I10 Essential (primary) hypertension: Secondary | ICD-10-CM | POA: Diagnosis present

## 2016-05-15 DIAGNOSIS — R0789 Other chest pain: Secondary | ICD-10-CM | POA: Diagnosis not present

## 2016-05-15 DIAGNOSIS — I493 Ventricular premature depolarization: Secondary | ICD-10-CM | POA: Diagnosis not present

## 2016-05-15 DIAGNOSIS — I2510 Atherosclerotic heart disease of native coronary artery without angina pectoris: Secondary | ICD-10-CM | POA: Diagnosis not present

## 2016-05-15 DIAGNOSIS — E876 Hypokalemia: Secondary | ICD-10-CM | POA: Diagnosis not present

## 2016-05-15 DIAGNOSIS — I110 Hypertensive heart disease with heart failure: Secondary | ICD-10-CM | POA: Diagnosis present

## 2016-05-15 DIAGNOSIS — K660 Peritoneal adhesions (postprocedural) (postinfection): Principal | ICD-10-CM | POA: Diagnosis present

## 2016-05-15 DIAGNOSIS — R109 Unspecified abdominal pain: Secondary | ICD-10-CM | POA: Diagnosis not present

## 2016-05-15 DIAGNOSIS — E860 Dehydration: Secondary | ICD-10-CM | POA: Diagnosis not present

## 2016-05-15 DIAGNOSIS — I499 Cardiac arrhythmia, unspecified: Secondary | ICD-10-CM

## 2016-05-15 DIAGNOSIS — R002 Palpitations: Secondary | ICD-10-CM | POA: Diagnosis present

## 2016-05-15 DIAGNOSIS — K219 Gastro-esophageal reflux disease without esophagitis: Secondary | ICD-10-CM | POA: Diagnosis present

## 2016-05-15 DIAGNOSIS — Z9889 Other specified postprocedural states: Secondary | ICD-10-CM

## 2016-05-15 DIAGNOSIS — F419 Anxiety disorder, unspecified: Secondary | ICD-10-CM | POA: Diagnosis present

## 2016-05-15 DIAGNOSIS — I498 Other specified cardiac arrhythmias: Secondary | ICD-10-CM

## 2016-05-15 DIAGNOSIS — Z0189 Encounter for other specified special examinations: Secondary | ICD-10-CM

## 2016-05-15 DIAGNOSIS — E785 Hyperlipidemia, unspecified: Secondary | ICD-10-CM | POA: Diagnosis not present

## 2016-05-15 DIAGNOSIS — K56699 Other intestinal obstruction unspecified as to partial versus complete obstruction: Secondary | ICD-10-CM | POA: Diagnosis not present

## 2016-05-15 DIAGNOSIS — Z681 Body mass index (BMI) 19 or less, adult: Secondary | ICD-10-CM | POA: Diagnosis not present

## 2016-05-15 DIAGNOSIS — R112 Nausea with vomiting, unspecified: Secondary | ICD-10-CM | POA: Diagnosis not present

## 2016-05-15 DIAGNOSIS — K469 Unspecified abdominal hernia without obstruction or gangrene: Secondary | ICD-10-CM | POA: Diagnosis present

## 2016-05-15 DIAGNOSIS — Z8679 Personal history of other diseases of the circulatory system: Secondary | ICD-10-CM

## 2016-05-15 DIAGNOSIS — Z951 Presence of aortocoronary bypass graft: Secondary | ICD-10-CM

## 2016-05-15 LAB — URINALYSIS, ROUTINE W REFLEX MICROSCOPIC
BILIRUBIN URINE: NEGATIVE
Glucose, UA: NEGATIVE mg/dL
Ketones, ur: 15 mg/dL — AB
Leukocytes, UA: NEGATIVE
Nitrite: NEGATIVE
PH: 7 (ref 5.0–8.0)
Protein, ur: NEGATIVE mg/dL
SPECIFIC GRAVITY, URINE: 1.015 (ref 1.005–1.030)

## 2016-05-15 LAB — CBC WITH DIFFERENTIAL/PLATELET
Basophils Absolute: 0 10*3/uL (ref 0.0–0.1)
Basophils Relative: 0 %
EOS ABS: 0.1 10*3/uL (ref 0.0–0.7)
Eosinophils Relative: 1 %
HCT: 38.6 % (ref 36.0–46.0)
HEMOGLOBIN: 12.8 g/dL (ref 12.0–15.0)
LYMPHS ABS: 1.8 10*3/uL (ref 0.7–4.0)
Lymphocytes Relative: 18 %
MCH: 29.3 pg (ref 26.0–34.0)
MCHC: 33.2 g/dL (ref 30.0–36.0)
MCV: 88.3 fL (ref 78.0–100.0)
MONOS PCT: 5 %
Monocytes Absolute: 0.5 10*3/uL (ref 0.1–1.0)
NEUTROS PCT: 76 %
Neutro Abs: 7.8 10*3/uL — ABNORMAL HIGH (ref 1.7–7.7)
Platelets: 209 10*3/uL (ref 150–400)
RBC: 4.37 MIL/uL (ref 3.87–5.11)
RDW: 13.8 % (ref 11.5–15.5)
WBC: 10.2 10*3/uL (ref 4.0–10.5)

## 2016-05-15 LAB — COMPREHENSIVE METABOLIC PANEL
ALK PHOS: 72 U/L (ref 38–126)
ALT: 21 U/L (ref 14–54)
ANION GAP: 9 (ref 5–15)
AST: 24 U/L (ref 15–41)
Albumin: 4 g/dL (ref 3.5–5.0)
BILIRUBIN TOTAL: 0.7 mg/dL (ref 0.3–1.2)
BUN: 11 mg/dL (ref 6–20)
CALCIUM: 10.1 mg/dL (ref 8.9–10.3)
CO2: 25 mmol/L (ref 22–32)
CREATININE: 0.76 mg/dL (ref 0.44–1.00)
Chloride: 103 mmol/L (ref 101–111)
Glucose, Bld: 99 mg/dL (ref 65–99)
Potassium: 3.8 mmol/L (ref 3.5–5.1)
SODIUM: 137 mmol/L (ref 135–145)
TOTAL PROTEIN: 6.6 g/dL (ref 6.5–8.1)

## 2016-05-15 LAB — URINALYSIS, MICROSCOPIC (REFLEX)

## 2016-05-15 LAB — I-STAT TROPONIN, ED: Troponin i, poc: 0 ng/mL (ref 0.00–0.08)

## 2016-05-15 LAB — TSH: TSH: 2.449 u[IU]/mL (ref 0.350–4.500)

## 2016-05-15 LAB — LIPASE, BLOOD: Lipase: 24 U/L (ref 11–51)

## 2016-05-15 LAB — I-STAT CG4 LACTIC ACID, ED: LACTIC ACID, VENOUS: 0.68 mmol/L (ref 0.5–1.9)

## 2016-05-15 LAB — MAGNESIUM: MAGNESIUM: 1.8 mg/dL (ref 1.7–2.4)

## 2016-05-15 MED ORDER — ENOXAPARIN SODIUM 40 MG/0.4ML ~~LOC~~ SOLN
40.0000 mg | SUBCUTANEOUS | Status: DC
  Administered 2016-05-16 – 2016-05-26 (×7): 40 mg via SUBCUTANEOUS
  Filled 2016-05-15 (×8): qty 0.4

## 2016-05-15 MED ORDER — ONDANSETRON HCL 4 MG/2ML IJ SOLN
4.0000 mg | Freq: Once | INTRAMUSCULAR | Status: AC
  Administered 2016-05-15: 4 mg via INTRAVENOUS
  Filled 2016-05-15: qty 2

## 2016-05-15 MED ORDER — SODIUM CHLORIDE 0.9 % IV SOLN
30.0000 meq | Freq: Once | INTRAVENOUS | Status: AC
  Administered 2016-05-16: 30 meq via INTRAVENOUS
  Filled 2016-05-15 (×2): qty 15

## 2016-05-15 MED ORDER — ORAL CARE MOUTH RINSE
15.0000 mL | Freq: Two times a day (BID) | OROMUCOSAL | Status: DC
  Administered 2016-05-16 – 2016-05-26 (×19): 15 mL via OROMUCOSAL

## 2016-05-15 MED ORDER — FENTANYL CITRATE (PF) 100 MCG/2ML IJ SOLN
50.0000 ug | Freq: Once | INTRAMUSCULAR | Status: AC
  Administered 2016-05-15: 50 ug via INTRAVENOUS
  Filled 2016-05-15: qty 2

## 2016-05-15 MED ORDER — VITAMIN D 1000 UNITS PO TABS: 2000.0000 [IU] | ORAL_TABLET | Freq: Every day | ORAL | Status: DC

## 2016-05-15 MED ORDER — MORPHINE SULFATE (PF) 4 MG/ML IV SOLN
4.0000 mg | Freq: Once | INTRAVENOUS | Status: AC
  Administered 2016-05-15: 4 mg via INTRAVENOUS
  Filled 2016-05-15: qty 1

## 2016-05-15 MED ORDER — MAGNESIUM SULFATE IN D5W 1-5 GM/100ML-% IV SOLN
1.0000 g | Freq: Once | INTRAVENOUS | Status: AC
  Administered 2016-05-16: 1 g via INTRAVENOUS
  Filled 2016-05-15 (×2): qty 100

## 2016-05-15 MED ORDER — PROMETHAZINE HCL 25 MG/ML IJ SOLN: 12.5000 mg | Freq: Four times a day (QID) | INTRAMUSCULAR | Status: DC | PRN

## 2016-05-15 MED ORDER — ALPRAZOLAM 0.25 MG PO TABS: 0.1250 mg | ORAL_TABLET | Freq: Two times a day (BID) | ORAL | Status: DC | PRN

## 2016-05-15 MED ORDER — SODIUM CHLORIDE 0.9 % IV BOLUS (SEPSIS)
1000.0000 mL | Freq: Once | INTRAVENOUS | Status: AC
  Administered 2016-05-15: 1000 mL via INTRAVENOUS

## 2016-05-15 MED ORDER — SODIUM CHLORIDE 0.9 % IV SOLN
INTRAVENOUS | Status: DC
  Administered 2016-05-16: via INTRAVENOUS

## 2016-05-15 MED ORDER — MORPHINE SULFATE (PF) 4 MG/ML IV SOLN
1.0000 mg | INTRAVENOUS | Status: DC | PRN
  Administered 2016-05-15: 3 mg via INTRAVENOUS
  Administered 2016-05-16: 2 mg via INTRAVENOUS
  Administered 2016-05-16 – 2016-05-22 (×9): 3 mg via INTRAVENOUS
  Filled 2016-05-15 (×11): qty 1

## 2016-05-15 MED ORDER — OMEGA-3-ACID ETHYL ESTERS 1 G PO CAPS: 1.0000 g | ORAL_CAPSULE | Freq: Every day | ORAL | Status: DC

## 2016-05-15 MED ORDER — TRAMADOL HCL 50 MG PO TABS
25.0000 mg | ORAL_TABLET | Freq: Four times a day (QID) | ORAL | Status: DC | PRN
Start: 2016-05-15 — End: 2016-05-18
  Administered 2016-05-17 – 2016-05-18 (×3): 25 mg via ORAL
  Filled 2016-05-15 (×3): qty 1

## 2016-05-15 MED ORDER — PANTOPRAZOLE SODIUM 40 MG PO TBEC: 40.0000 mg | DELAYED_RELEASE_TABLET | Freq: Every day | ORAL | Status: DC

## 2016-05-15 MED ORDER — ASPIRIN EC 81 MG PO TBEC: 81.0000 mg | DELAYED_RELEASE_TABLET | Freq: Every day | ORAL | Status: DC

## 2016-05-15 MED ORDER — FLUTICASONE PROPIONATE 50 MCG/ACT NA SUSP
2.0000 | Freq: Every day | NASAL | Status: DC | PRN
  Filled 2016-05-15: qty 16

## 2016-05-15 MED ORDER — IOPAMIDOL (ISOVUE-300) INJECTION 61%
INTRAVENOUS | Status: AC
  Filled 2016-05-15: qty 30

## 2016-05-15 MED ORDER — METHIMAZOLE 5 MG PO TABS
5.0000 mg | ORAL_TABLET | Freq: Every day | ORAL | Status: DC
  Filled 2016-05-15: qty 1

## 2016-05-15 MED ORDER — ACETAMINOPHEN 325 MG PO TABS
650.0000 mg | ORAL_TABLET | Freq: Four times a day (QID) | ORAL | Status: DC | PRN
  Administered 2016-05-16 – 2016-05-17 (×4): 650 mg via ORAL
  Filled 2016-05-15 (×5): qty 2

## 2016-05-15 MED ORDER — IOPAMIDOL (ISOVUE-300) INJECTION 61%
INTRAVENOUS | Status: AC
  Administered 2016-05-15: 100 mL
  Filled 2016-05-15: qty 100

## 2016-05-15 MED ORDER — SODIUM CHLORIDE 0.9% FLUSH
3.0000 mL | Freq: Two times a day (BID) | INTRAVENOUS | Status: DC
  Administered 2016-05-16 – 2016-05-23 (×13): 3 mL via INTRAVENOUS
  Administered 2016-05-23: 10 mL via INTRAVENOUS
  Administered 2016-05-24 – 2016-05-26 (×4): 3 mL via INTRAVENOUS

## 2016-05-15 MED ORDER — GI COCKTAIL ~~LOC~~
30.0000 mL | Freq: Once | ORAL | Status: AC
  Administered 2016-05-15: 30 mL via ORAL
  Filled 2016-05-15: qty 30

## 2016-05-15 MED ORDER — PROMETHAZINE HCL 25 MG/ML IJ SOLN
12.5000 mg | Freq: Four times a day (QID) | INTRAMUSCULAR | Status: DC | PRN
  Filled 2016-05-15: qty 1

## 2016-05-15 MED ORDER — ONDANSETRON HCL 4 MG/2ML IJ SOLN: 4.0000 mg | Freq: Four times a day (QID) | INTRAMUSCULAR | Status: DC | PRN

## 2016-05-15 MED ORDER — CARVEDILOL 3.125 MG PO TABS: 3.1250 mg | ORAL_TABLET | Freq: Two times a day (BID) | ORAL | Status: DC

## 2016-05-15 MED ORDER — LABETALOL HCL 5 MG/ML IV SOLN
10.0000 mg | INTRAVENOUS | Status: DC | PRN
Start: 2016-05-15 — End: 2016-05-16

## 2016-05-15 MED ORDER — SODIUM CHLORIDE 0.9 % IV SOLN: Freq: Once | INTRAVENOUS | Status: DC

## 2016-05-15 NOTE — ED Triage Notes (Signed)
Per EMS: Pt complaining of epigastric and L sided rib pain. Pt with bigeminy rate in the 80's. Pt with abdominal pain while in bigeminy. Pt with hx of CHF, used to take amiodarone. Pt EMS gave 1 nitro and 324ASA. Pt states took 0.25mg  xanax prior to EMS arrival.

## 2016-05-15 NOTE — ED Notes (Signed)
Pt made aware of bed assignment 

## 2016-05-15 NOTE — ED Notes (Signed)
Pt on bedpan, not taken to CT yet.

## 2016-05-15 NOTE — ED Notes (Signed)
Patient transported to CT 

## 2016-05-15 NOTE — H&P (Signed)
History and Physical    [name] [name]:[id number] DOB: [birth year] DOA: 05/15/2016  PCP: Minda Meo, MD   Patient coming from: Home  Chief Complaint: Vomiting, palpitations  HPI: [name] [name] is a 76 y.o. female with medical history significant for hypertension, coronary artery disease, GERD, hyperthyroidism, and mitral valve prolapse status post MVR who presents to the emergency department with one-day vomiting and palpitations. Patient reports that she had been in her usual state of health until shortly after breakfast when she developed acute nausea with nonbloody nonbilious vomiting. These symptoms persisted throughout the morning and afternoon and she developed palpitations. She reports a history of anxiety and has been very concerned with the palpitations, prompting her presentation to the ED. She reports a normal bowel movement the day prior. She reports mid-abdominal pain, described as cramping, moderate in intensity, localized to the mid-abdomen, and without associated fevers, chills, or diarrhea. Patient had eaten organic eggs for breakfast and suspects that has causing her symptoms. She has not been able to tolerate anything by mouth since the vomiting developed. She tried taking the Protonix at home, but quickly vomited and then felt as though it made things worse. She denies similar symptoms previously.   ED Course: Upon arrival to the ED, patient is found to be afebrile, saturating well on room air, mildly tachycardic, and with vitals otherwise stable. EKG features a sinus tachycardia with rate 107 and pared PVCs. Chemistry panel is unremarkable and CBC is also within the normal limits. Troponin was obtained and undetectable. Lactic acid is reassuring at 0.68. Urinalysis features moderate hemoglobin, but is not suggestive of infection. The patient's primary complaint was palpitations and her cardiologist was consulted by the ED physician. It was advised that the patient be  hydrated and monitored on telemetry, with the palpitations likely secondary to frequent PVCs noted on the monitor. Ultimately, a CT of the abdomen and pelvis was obtained and demonstrates a small bowel obstruction with transition point in the mid abdomen, with findings suggestive of an internal hernia that site. Patient refused NG tube. She was given a liter of normal saline and Zofran. Tachycardia resolved with the fluid bolus, patient remained hemodynamically stable and in no apparent respiratory distress, and she will be admitted to the telemetry unit for ongoing evaluation and management of small bowel obstruction, as well as palpitations with frequent PVCs noted on EKG.  Review of Systems:  All other systems reviewed and apart from HPI, are negative.  Past Medical History:  Diagnosis Date  . Acute coronary syndrome (HCC) 06/2015  . 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 Anemia   . Anxiety   . Aortic stenosis 05/19/2012   trivial by TEE  . Atrial fibrillation (HCC)    a. Dx 04/2012;  b. Amio d/c'd 2/2 hyperthyroidism;  c. Atrial flutter 10/2012;  d. On xarelto.  . CHF (congestive heart failure) (HCC)   . Complication of anesthesia   . Coronary artery disease   . Diverticulitis   . GERD (gastroesophageal reflux disease)   . Goiter   . Headache   . Heart murmur   . Hyperlipidemia    recently- taken off Crestor- due to pain in her legs, but this was after cardiac cath. , pt. questioning whether the pain in her legs was related to cath. or crestor  . Hypertension    Then hypotension 04/2012 limiting med adjustment.  . Microscopic hematuria 08/20/2012  . Mitral valve prolapse    a. H/o MVP, with severe MR  04/2012, had MV repair and Maze  . Pneumonia    treated /w antibiotic- [facility]- early Feb. 2014  . PONV (postoperative nausea and vomiting)   . Refusal of blood transfusions as patient is Jehov[employer]'s Witness   . S/P Maze operation for atrial fibrillation 06/28/2012   Complete bilateral  atrial lesion set using cryothermy ablation with oversewing of LA appendage via right minithoracotomy  . S/P mitral valve repair 06/28/2012   Complex valvuloplasty including quadrangular resection of posterior leaflet, sliding leaflet plasty, artificial Gore-tex neocord placement x2 and 88mm Sorin Memo 3D ring annuloplasty via right mini thoracotomy   . Shortness of breath     Past Surgical History:  Procedure Laterality Date  . CARDIAC CATHETERIZATION N/A 07/16/2015   Procedure: Left Heart Cath and Coronary Angiography;  Surgeon: Orpah Cobb, MD;  Location: [facility] INVASIVE CV LAB;  Service: Cardiovascular;  Laterality: N/A;  . CORONARY ARTERY BYPASS GRAFT    . INTRAOPERATIVE TRANSESOPHAGEAL ECHOCARDIOGRAM N/A 06/28/2012   Procedure: INTRAOPERATIVE TRANSESOPHAGEAL ECHOCARDIOGRAM;  Surgeon: Purcell Nails, MD;  Location: [facility] OR;  Service: Open Heart Surgery;  Laterality: N/A;  . LEFT AND RIGHT HEART CATHETERIZATION WITH CORONARY ANGIOGRAM N/A 05/22/2012   Procedure: LEFT AND RIGHT HEART CATHETERIZATION WITH CORONARY ANGIOGRAM;  Surgeon: Kathleene Hazel, MD;  Location: [facility] CATH LAB;  Service: Cardiovascular;  Laterality: N/A;  . MINIMALLY INVASIVE MAZE PROCEDURE N/A 06/28/2012   Procedure: MINIMALLY INVASIVE MAZE PROCEDURE;  Surgeon: Purcell Nails, MD;  Location: [facility] OR;  Service: Open Heart Surgery;  Laterality: N/A;  . MITRAL VALVE REPAIR Right 06/28/2012   Procedure: MINIMALLY INVASIVE MITRAL VALVE REPAIR (MVR);  Surgeon: Purcell Nails, MD;  Location: [facility] OR;  Service: Open Heart Surgery;  Laterality: Right;  . OVARIAN CYST SURGERY  1988  . TEE WITHOUT CARDIOVERSION  05/19/2012   Procedure: TRANSESOPHAGEAL ECHOCARDIOGRAM (TEE);  Surgeon: Dolores Patty, MD;  Location: [facility] ENDOSCOPY;  Service: Cardiovascular;  Laterality: N/A;  . TUBAL LIGATION  1974  . URETHRAL STRICTURE DILATATION    . UTERINE FIBROID SURGERY  1988  . `       reports that she has never smoked. She has never used smokeless  tobacco. She reports that she does not drink alcohol or use drugs.  Allergies  Allergen Reactions  . Amiodarone Other (See Comments)    Hyperthyroidism   . Diltiazem Other (See Comments)    Put into hospital for 3 days  . Macrobid [Nitrofurantoin Monohyd Macro] Nausea Only  . Aspirin Rash    Tolerates low dose aspirin  . Naproxen Sodium Swelling and Rash  . Sulfa Drugs Cross Reactors Other (See Comments)    Allergy per mother - unknown reaction    Family History  Problem Relation Age of Onset  . Colon cancer Father      Prior to Admission medications   Medication Sig Start Date End Date Taking? Authorizing Provider  acetaminophen (TYLENOL) 500 MG tablet Take 500 mg by mouth every 6 (six) hours as needed (pain).    Yes Historical Provider, MD  ALPRAZolam (XANAX) 0.25 MG tablet Take 0.125-0.25 mg by mouth 2 (two) times daily as needed for anxiety. For anxiety   Yes Historical Provider, MD  aspirin EC 81 MG tablet Take 81 mg by mouth daily at 2 PM.    Yes Historical Provider, MD  b complex vitamins tablet Take 1 tablet by mouth daily before lunch.   Yes Historical Provider, MD  carvedilol (COREG) 3.125 MG tablet Take 1 tablet (  3.125 mg total) by mouth 2 (two) times daily with a meal. 07/18/15  Yes Orpah Cobb, MD  Cholecalciferol (VITAMIN D3) 2000 UNITS TABS Take 2,000 Units by mouth daily before lunch.    Yes Historical Provider, MD  Cyanocobalamin (VITAMIN B-12 PO) Take 1 tablet by mouth daily before lunch.   Yes Historical Provider, MD  fluticasone (FLONASE) 50 MCG/ACT nasal spray Place 2 sprays into the nose daily as needed for allergies.  12/08/12  Yes Historical Provider, MD  methimazole (TAPAZOLE) 5 MG tablet Take 5 mg by mouth daily.   Yes Historical Provider, MD  Omega-3 Fatty Acids (FISH OIL PO) Take 1 capsule by mouth daily before lunch.   Yes Historical Provider, MD  OVER THE COUNTER MEDICATION Place 1 drop into both eyes daily as needed (dry eyes/ irritation).   Yes  Historical Provider, MD  pantoprazole (PROTONIX) 40 MG tablet Take 40 mg by mouth daily.  08/24/12  Yes Geoffry Paradise, MD  Probiotic Product (PROBIOTIC DAILY) CAPS Take 1 capsule by mouth daily before lunch.    Yes Historical Provider, MD  traMADol (ULTRAM) 50 MG tablet Take 0.5 tablets (25 mg total) by mouth every 6 (six) hours as needed (pain). 07/18/15  Yes Orpah Cobb, MD    Physical Exam: Vitals:   05/15/16 1700 05/15/16 1914 05/15/16 2100  BP: 138/55 160/83 134/74  Pulse: 90 102 (!) 51  Resp: 22 22 17   Temp: 97.8 F (36.6 C)    TempSrc: Oral    SpO2: 99% 99% 99%      Constitutional: No respiratory distress, no pallor, no diaphoresis Eyes: PERTLA, lids and conjunctivae normal ENMT: Mucous membranes are dry. Posterior pharynx clear of any exudate or lesions.   Neck: normal, supple, no masses, no thyromegaly Respiratory: clear to auscultation bilaterally, no wheezing, no crackles. Normal respiratory effort.   Cardiovascular: S1 & S2 heard, regular rate and rhythm. No extremity edema. No significant JVD. Abdomen: No distension, tender in mid-abdomen without rebound pain or guarding, no masses palpated. Bowel sounds are active.  Musculoskeletal: no clubbing / cyanosis. No joint deformity upper and lower extremities. Normal muscle tone.  Skin: no significant rashes, lesions, ulcers. Warm, dry, well-perfused. Neurologic: CN 2-12 grossly intact. Sensation intact, DTR normal. Strength 5/5 in all 4 limbs.  Psychiatric: Normal judgment and insight. Alert and oriented x 3. Very anxious, cooperative, pleasant.     Labs on Admission: I have personally reviewed following labs and imaging studies  CBC:  Recent Labs Lab 05/15/16 1713  WBC 10.2  NEUTROABS 7.8*  HGB 12.8  HCT 38.6  MCV 88.3  PLT 209   Basic Metabolic Panel:  Recent Labs Lab 05/15/16 1713  NA 137  K 3.8  CL 103  CO2 25  GLUCOSE 99  BUN 11  CREATININE 0.76  CALCIUM 10.1  MG 1.8   GFR: CrCl cannot be  calculated (Unknown ideal weight.). Liver Function Tests:  Recent Labs Lab 05/15/16 1713  AST 24  ALT 21  ALKPHOS 72  BILITOT 0.7  PROT 6.6  ALBUMIN 4.0    Recent Labs Lab 05/15/16 1713  LIPASE 24   No results for input(s): AMMONIA in the last 168 hours. Coagulation Profile: No results for input(s): INR, PROTIME in the last 168 hours. Cardiac Enzymes: No results for input(s): CKTOTAL, CKMB, CKMBINDEX, TROPONINI in the last 168 hours. BNP (last 3 results) No results for input(s): PROBNP in the last 8760 hours. HbA1C: No results for input(s): HGBA1C in the last 72 hours.  CBG: No results for input(s): GLUCAP in the last 168 hours. Lipid Profile: No results for input(s): CHOL, HDL, LDLCALC, TRIG, CHOLHDL, LDLDIRECT in the last 72 hours. Thyroid Function Tests:  Recent Labs  05/15/16 1818  TSH 2.449   Anemia Panel: No results for input(s): VITAMINB12, FOLATE, FERRITIN, TIBC, IRON, RETICCTPCT in the last 72 hours. Urine analysis:    Component Value Date/Time   COLORURINE YELLOW 05/15/2016 2146   APPEARANCEUR CLEAR 05/15/2016 2146   LABSPEC 1.015 05/15/2016 2146   PHURINE 7.0 05/15/2016 2146   GLUCOSEU NEGATIVE 05/15/2016 2146   HGBUR MODERATE (A) 05/15/2016 2146   BILIRUBINUR NEGATIVE 05/15/2016 2146   BILIRUBINUR negative 11/02/2014 1336   KETONESUR 15 (A) 05/15/2016 2146   PROTEINUR NEGATIVE 05/15/2016 2146   UROBILINOGEN 0.2 11/02/2014 1336   UROBILINOGEN 0.2 02/10/2013 1615   NITRITE NEGATIVE 05/15/2016 2146   LEUKOCYTESUR NEGATIVE 05/15/2016 2146   Sepsis Labs: @LABRCNTIP (procalcitonin:4,lacticidven:4) )No results found for this or any previous visit (from the past 240 hour(s)).   Radiological Exams on Admission: No results found.  EKG: Independently reviewed. Sinus tachycardia (rate 107), paired PVC's   Assessment/Plan  1. SBO  - Pt developed acute nausea and vomiting after breakfast on 05/15/16  - CT abd/pelvis demonstrates SBO with findings  suggestive of internal hernia  - Pt refused NGT - Plan to keep NPO, provide gentle IV hydration, prn antiemetics, prn analgesia    2. Palpitations   - Likely secondary to frequent PVC's  - Will supplement potassium and magnesium to goal of 4.0 and 2.0, respectively  - Monitor on telemetry  - Cardiology was consulted by ED physician and much appreciated    3. Hypertension  - BP at goal on admission  - Treated with Coreg at home, will resume when she is appropriate for diet - Treat as needed with labetalol IVP's for now    4. Anxiety  - Pt appears quite anxious on admission  - Treat with prn Ativan until able to tolerate PO and resume her Xanax    DVT prophylaxis: sq Lovenox Code Status: Full  Family Communication: Discussed with patient  Disposition Plan: Observe on telemetry Consults called: Cardiology  Admission status: Observation    Briscoe Deutscher, MD [facility] Pager [phone]  If 7PM-7AM, please contact night-coverage [email or web address] Password [facility]  05/15/2016, 10:36 PM

## 2016-05-15 NOTE — ED Provider Notes (Signed)
[facility]-EMERGENCY DEPT Provider Note   CSN: [id number] Arrival date & time: 05/15/16  1649     History   Chief Complaint Chief Complaint  Patient presents with  . Palpitations    HPI [name] [name] is a 76 y.o. female.  HPI 76 year old female with extensive past medical history including aortic stenosis, atrial fibrillation previously on amiodarone, CHF, mitral valve prolapse, who presents with abdominal pain and lightheadedness. The patient states she was in her usual state of health until eating breakfast this morning. She ate eggs and a biscuit. Approximately 30 minutes later, she developed belching and mild epigastric pain which progressively worsened. The pain seems to come and go and is described as an aching, gnawing, pressure-like sensation that radiates to her bilateral flanks. She has associated lightheadedness as well as shortness of breath. She also endorses palpitations but denies overt chest pain. She has not recently changed any of her medications. She tried Pepto-Bismol which seemed to actually worsen her symptoms.  Past Medical History:  Diagnosis Date  . Acute coronary syndrome (HCC) 06/2015  . 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 Anemia   . Anxiety   . Aortic stenosis 05/19/2012   trivial by TEE  . Atrial fibrillation (HCC)    a. Dx 04/2012;  b. Amio d/c'd 2/2 hyperthyroidism;  c. Atrial flutter 10/2012;  d. On xarelto.  . CHF (congestive heart failure) (HCC)   . Complication of anesthesia   . Coronary artery disease   . Diverticulitis   . GERD (gastroesophageal reflux disease)   . Goiter   . Headache   . Heart murmur   . Hyperlipidemia    recently- taken off Crestor- due to pain in her legs, but this was after cardiac cath. , pt. questioning whether the pain in her legs was related to cath. or crestor  . Hypertension    Then hypotension 04/2012 limiting med adjustment.  . Microscopic hematuria 08/20/2012  . Mitral valve prolapse    a. H/o MVP, with  severe MR 04/2012, had MV repair and Maze  . Pneumonia    treated /w antibiotic- [facility]- early Feb. 2014  . PONV (postoperative nausea and vomiting)   . Refusal of blood transfusions as patient is Jehovah's Witness   . S/P Maze operation for atrial fibrillation 06/28/2012   Complete bilateral atrial lesion set using cryothermy ablation with oversewing of LA appendage via right minithoracotomy  . S/P mitral valve repair 06/28/2012   Complex valvuloplasty including quadrangular resection of posterior leaflet, sliding leaflet plasty, artificial Gore-tex neocord placement x2 and 32mm Sorin Memo 3D ring annuloplasty via right mini thoracotomy   . Shortness of breath     Patient Active Problem List   Diagnosis Date Noted  . Intractable vomiting 05/15/2016  . Heart palpitations 05/15/2016  . SBO (small bowel obstruction) 05/15/2016  . Acute coronary syndrome (HCC) 07/15/2015  . Status post Maze operation for atrial fibrillation 01/08/2013  . S/P MVR (mitral valve repair) 01/08/2013  . Atrial flutter (HCC) 10/30/2012  . Amaurosis fugax 10/30/2012  . Dyspnea 10/28/2012  . 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 Thyroid goiter 08/21/2012  . Microscopic hematuria 08/20/2012  . S/P mitral valve repair 06/28/2012  . History of mitral valve prolapse with severe mitral regurgtation 05/19/2012  . Refusal of blood transfusions as patient is Jehovah's Witness 05/19/2012  . Atrial fibrillation (HCC) 05/16/2012  . Anxiety 04/27/2011  . Hypertension 04/26/2011  . Diverticulitis 04/24/2011  . Hyperlipidemia     Past Surgical History:  Procedure Laterality Date  . CARDIAC CATHETERIZATION N/A 07/16/2015   Procedure: Left Heart Cath and Coronary Angiography;  Surgeon: Orpah Cobb, MD;  Location: [facility] INVASIVE CV LAB;  Service: Cardiovascular;  Laterality: N/A;  . CORONARY ARTERY BYPASS GRAFT    . INTRAOPERATIVE TRANSESOPHAGEAL ECHOCARDIOGRAM N/A 06/28/2012   Procedure: INTRAOPERATIVE TRANSESOPHAGEAL  ECHOCARDIOGRAM;  Surgeon: Purcell Nails, MD;  Location: [facility] OR;  Service: Open Heart Surgery;  Laterality: N/A;  . LEFT AND RIGHT HEART CATHETERIZATION WITH CORONARY ANGIOGRAM N/A 05/22/2012   Procedure: LEFT AND RIGHT HEART CATHETERIZATION WITH CORONARY ANGIOGRAM;  Surgeon: Kathleene Hazel, MD;  Location: [facility] CATH LAB;  Service: Cardiovascular;  Laterality: N/A;  . MINIMALLY INVASIVE MAZE PROCEDURE N/A 06/28/2012   Procedure: MINIMALLY INVASIVE MAZE PROCEDURE;  Surgeon: Purcell Nails, MD;  Location: [facility] OR;  Service: Open Heart Surgery;  Laterality: N/A;  . MITRAL VALVE REPAIR Right 06/28/2012   Procedure: MINIMALLY INVASIVE MITRAL VALVE REPAIR (MVR);  Surgeon: Purcell Nails, MD;  Location: [facility] OR;  Service: Open Heart Surgery;  Laterality: Right;  . OVARIAN CYST SURGERY  1988  . TEE WITHOUT CARDIOVERSION  05/19/2012   Procedure: TRANSESOPHAGEAL ECHOCARDIOGRAM (TEE);  Surgeon: Dolores Patty, MD;  Location: [facility] ENDOSCOPY;  Service: Cardiovascular;  Laterality: N/A;  . TUBAL LIGATION  1974  . URETHRAL STRICTURE DILATATION    . UTERINE FIBROID SURGERY  1988  . `      OB History    No data available       Home Medications    Prior to Admission medications   Medication Sig Start Date End Date Taking? Authorizing Provider  acetaminophen (TYLENOL) 500 MG tablet Take 500 mg by mouth every 6 (six) hours as needed (pain).    Yes Historical Provider, MD  ALPRAZolam (XANAX) 0.25 MG tablet Take 0.125-0.25 mg by mouth 2 (two) times daily as needed for anxiety. For anxiety   Yes Historical Provider, MD  aspirin EC 81 MG tablet Take 81 mg by mouth daily at 2 PM.    Yes Historical Provider, MD  b complex vitamins tablet Take 1 tablet by mouth daily before lunch.   Yes Historical Provider, MD  carvedilol (COREG) 3.125 MG tablet Take 1 tablet (3.125 mg total) by mouth 2 (two) times daily with a meal. 07/18/15  Yes Orpah Cobb, MD  Cholecalciferol (VITAMIN D3) 2000 UNITS TABS Take 2,000 Units by  mouth daily before lunch.    Yes Historical Provider, MD  Cyanocobalamin (VITAMIN B-12 PO) Take 1 tablet by mouth daily before lunch.   Yes Historical Provider, MD  fluticasone (FLONASE) 50 MCG/ACT nasal spray Place 2 sprays into the nose daily as needed for allergies.  12/08/12  Yes Historical Provider, MD  methimazole (TAPAZOLE) 5 MG tablet Take 5 mg by mouth daily.   Yes Historical Provider, MD  Omega-3 Fatty Acids (FISH OIL PO) Take 1 capsule by mouth daily before lunch.   Yes Historical Provider, MD  OVER THE COUNTER MEDICATION Place 1 drop into both eyes daily as needed (dry eyes/ irritation).   Yes Historical Provider, MD  pantoprazole (PROTONIX) 40 MG tablet Take 40 mg by mouth daily.  08/24/12  Yes Geoffry Paradise, MD  Probiotic Product (PROBIOTIC DAILY) CAPS Take 1 capsule by mouth daily before lunch.    Yes Historical Provider, MD  traMADol (ULTRAM) 50 MG tablet Take 0.5 tablets (25 mg total) by mouth every 6 (six) hours as needed (pain). 07/18/15  Yes Orpah Cobb, MD    Family  History Family History  Problem Relation Age of Onset  . Colon cancer Father     Social History Social History  Substance Use Topics  . Smoking status: Never Smoker  . Smokeless tobacco: Never Used  . Alcohol use No     Comment: 1 drink a month     Allergies   Amiodarone; Diltiazem; Macrobid [nitrofurantoin monohyd macro]; Aspirin; Naproxen sodium; and Sulfa drugs cross reactors   Review of Systems Review of Systems  Constitutional: Positive for fatigue. Negative for chills and fever.  HENT: Negative for congestion and rhinorrhea.   Eyes: Negative for visual disturbance.  Respiratory: Negative for cough, shortness of breath and wheezing.   Cardiovascular: Positive for palpitations. Negative for chest pain and leg swelling.  Gastrointestinal: Positive for abdominal pain and nausea. Negative for diarrhea and vomiting.  Genitourinary: Negative for dysuria and flank pain.  Musculoskeletal: Negative  for neck pain and neck stiffness.  Skin: Negative for rash and wound.  Allergic/Immunologic: Negative for immunocompromised state.  Neurological: Positive for weakness and light-headedness. Negative for syncope and headaches.  All other systems reviewed and are negative.    Physical Exam Updated Vital Signs BP (!) 149/107   Pulse 67   Temp 97.8 F (36.6 C) (Oral)   Resp 23   SpO2 100%   Physical Exam  Constitutional: She is oriented to person, place, and time. She appears well-developed and well-nourished. She appears distressed.  HENT:  Head: Normocephalic and atraumatic.  Eyes: Conjunctivae are normal.  Neck: Neck supple.  Cardiovascular: Normal heart sounds.  An irregularly irregular rhythm present. Bradycardia present.  Exam reveals no friction rub.   No murmur heard. Pulmonary/Chest: Effort normal and breath sounds normal. No respiratory distress. She has no wheezes. She has no rales.  Abdominal: Soft. Bowel sounds are normal. She exhibits no distension. There is tenderness. There is guarding.  Musculoskeletal: She exhibits no edema.  Neurological: She is alert and oriented to person, place, and time. She exhibits normal muscle tone.  Skin: Skin is warm. Capillary refill takes less than 2 seconds.  Psychiatric: She has a normal mood and affect.  Nursing note and vitals reviewed.    ED Treatments / Results  Labs (all labs ordered are listed, but only abnormal results are displayed) Labs Reviewed  CBC WITH DIFFERENTIAL/PLATELET - Abnormal; Notable for the following:       Result Value   Neutro Abs 7.8 (*)    All other components within normal limits  URINALYSIS, ROUTINE W REFLEX MICROSCOPIC - Abnormal; Notable for the following:    Hgb urine dipstick MODERATE (*)    Ketones, ur 15 (*)    All other components within normal limits  URINALYSIS, MICROSCOPIC (REFLEX) - Abnormal; Notable for the following:    Bacteria, UA RARE (*)    Squamous Epithelial / LPF 0-5 (*)     All other components within normal limits  COMPREHENSIVE METABOLIC PANEL  LIPASE, BLOOD  MAGNESIUM  TSH  TROPONIN I  TROPONIN I  BASIC METABOLIC PANEL  I-STAT CG4 LACTIC ACID, ED  I-STAT TROPOININ, ED    EKG  EKG Interpretation  Date/Time:  Saturday May 15 2016 16:58:50 EST Ventricular Rate:  107 PR Interval:    QRS Duration: 99 QT Interval:  379 QTC Calculation: 462 R Axis:   18 Text Interpretation:  Sinus tachycardia Paired ventricular premature complexes Borderline repolarization abnormality Baseline wander in lead(s) V2 Since last EKG, deep TWI in laterla leads are no longer present PVCs are now  evidence and are frequent Confirmed by [facility] MD, Sheria Lang [phone]) on 05/15/2016 5:57:08 PM       Radiology Ct Abdomen Pelvis W Contrast  Result Date: 05/15/2016 CLINICAL DATA:  76 y/o F; abdominal pain radiating to the back with vomiting. EXAM: CT ABDOMEN AND PELVIS WITH CONTRAST TECHNIQUE: Multidetector CT imaging of the abdomen and pelvis was performed using the standard protocol following bolus administration of intravenous contrast. CONTRAST:  1 ISOVUE-300 IOPAMIDOL (ISOVUE-300) INJECTION 61% COMPARISON:  09/22/2014 CT abdomen and pelvis FINDINGS: Lower chest: Cardiomegaly with biatrial enlargement. Mitral annuloplasty. Mild aortic valvular calcification. Hepatobiliary: Focal fat along falciform ligament. Normal gallbladder. No intra or extrahepatic biliary ductal dilatation. Pancreas: Unremarkable. No pancreatic ductal dilatation or surrounding inflammatory changes. Spleen: Punctate posterior calcification is stable compatible sequelae of prior granulomatous disease. Adrenals/Urinary Tract: Bilateral renal cysts largest on the right extending inferiorly exophytic leak from the right kidney lower pole, decreased since from prior study. Normal adrenal glands. No urinary stone disease or obstructive uropathy. Normal bladder. Stomach/Bowel: Small bowel obstruction in the left hemiabdomen  with transition point in left mid abdomen (series 5, image 24). There appears to be several loops of collapsed bowel that are edematous with small surrounding small volume of ascites in the left upper quadrant (series 5, image 39) and there appears to be afferent and effent limbs passing through the same region with mesentery suggesting an internal hernia (series 6, image 71). Diverticulosis without diverticulitis. Vascular/Lymphatic: Surgical clips in the right groin likely related to prior vascular access. Severe aortic atherosclerosis with concentric calcification no aortic aneurysm. No lymphadenopathy identified. Reproductive: Uterus and bilateral adnexa are unremarkable. Other: Trace volume of pelvic ascites. Musculoskeletal: Lumbar degenerative changes with prominent lower lumbar facet arthropathy. No acute osseous abnormality. IMPRESSION: 1. Small bowel obstruction with transition point in the left mid abdomen. At the point of transition there are findings suggesting internal hernia of small bowel into the left posterior upper quadrant. 2. Cardiomegaly. 3. Aortic atherosclerosis. These results will be called to the ordering clinician or representative by the Radiologist Assistant, and communication documented in the PACS or zVision Dashboard. Electronically Signed   By: Mitzi Hansen M.D.   On: 05/15/2016 22:44    Procedures Procedures (including critical care time)  Medications Ordered in ED Medications  iopamidol (ISOVUE-300) 61 % injection (not administered)  morphine 4 MG/ML injection 1-3 mg (not administered)  promethazine (PHENERGAN) injection 12.5 mg (not administered)  carvedilol (COREG) tablet 3.125 mg (not administered)  omega-3 acid ethyl esters (LOVAZA) capsule 1 g (not administered)  traMADol (ULTRAM) tablet 25 mg (not administered)  Vitamin D3 TABS 2,000 Units (not administered)  methimazole (TAPAZOLE) tablet 5 mg (not administered)  aspirin EC tablet 81 mg (not  administered)  fluticasone (FLONASE) 50 MCG/ACT nasal spray 2 spray (not administered)  pantoprazole (PROTONIX) EC tablet 40 mg (not administered)  ALPRAZolam (XANAX) tablet 0.125-0.25 mg (not administered)  acetaminophen (TYLENOL) tablet 650 mg (not administered)  enoxaparin (LOVENOX) injection 40 mg (not administered)  sodium chloride flush (NS) 0.9 % injection 3 mL (not administered)  magnesium sulfate IVPB 1 g 100 mL (not administered)  0.9 %  sodium chloride infusion (not administered)  potassium chloride 30 mEq in sodium chloride 0.9 % 265 mL (KCL MULTIRUN) IVPB (not administered)  labetalol (NORMODYNE,TRANDATE) injection 10 mg (not administered)  sodium chloride 0.9 % bolus 1,000 mL (0 mLs Intravenous Stopped 05/15/16 1822)  fentaNYL (SUBLIMAZE) injection 50 mcg (50 mcg Intravenous Given 05/15/16 1722)  gi cocktail (Maalox,Lidocaine,Donnatal) (30 mLs  Oral Given 05/15/16 1722)  morphine 4 MG/ML injection 4 mg (4 mg Intravenous Given 05/15/16 2002)  ondansetron (ZOFRAN) injection 4 mg (4 mg Intravenous Given 05/15/16 2005)  iopamidol (ISOVUE-300) 61 % injection (100 mLs  Contrast Given 05/15/16 2150)     Initial Impression / Assessment and Plan / ED Course  I have reviewed the triage vital signs and the nursing notes.  Pertinent labs & imaging results that were available during my care of the patient were reviewed by me and considered in my medical decision making (see chart for details).     76 yo F with extensive PMHx here with nausea, epigastric pain, and symptomatic palpitation. En route, pt noted to be in bigeminy with EMS with non-perfusion of ventricular beats and sx of lightheadedness/dizziness. Pt HDS at this time. Exam as above. Unclear if all her sx are 2/2 symptomatic PVCs/bigeminy or whether this is 2/2 abdominal process such as pancreatitis, gastritis, ileitis, and she does have known sick contacts. Will check labs, lytes, and monitor. GI cocktail and fentanyl for  pain.  Labs show no significant abnormality. Trop negative. Pt has persistent vomiting. D/w Dr. Jacinto Halim who recommends obs with Hospitalist. Given significant vomiting and dehydration, will also obtain CT.  CT scan shows partial SBO with transition point from possible internal hernia. D/w CCS as well, who recommends NGT, NPO, admit to hospitalist.  Final Clinical Impressions(s) / ED Diagnoses   Final diagnoses:  Bigeminy  Dehydration  SBO (small bowel obstruction)     Shaune Pollack, MD 05/16/16 [phone]

## 2016-05-15 NOTE — ED Notes (Signed)
Attempted report x1.  Name and callback number provided.   

## 2016-05-15 NOTE — ED Notes (Signed)
Pt vomited, Provider notified, Pt cleaned up, sheet and gown changed

## 2016-05-15 NOTE — ED Notes (Signed)
Patient transported to CT. Pt refusing to drink contrast because it makes her vomit.  CT informed, will scan as is.

## 2016-05-15 NOTE — ED Notes (Signed)
Pt given ice chips per MD

## 2016-05-15 NOTE — ED Notes (Signed)
Pt actively vomited at time of zofran administration

## 2016-05-16 ENCOUNTER — Observation Stay (HOSPITAL_COMMUNITY): Payer: PPO

## 2016-05-16 DIAGNOSIS — Z7982 Long term (current) use of aspirin: Secondary | ICD-10-CM | POA: Diagnosis not present

## 2016-05-16 DIAGNOSIS — K56600 Partial intestinal obstruction, unspecified as to cause: Secondary | ICD-10-CM | POA: Diagnosis not present

## 2016-05-16 DIAGNOSIS — F419 Anxiety disorder, unspecified: Secondary | ICD-10-CM | POA: Diagnosis not present

## 2016-05-16 DIAGNOSIS — D500 Iron deficiency anemia secondary to blood loss (chronic): Secondary | ICD-10-CM | POA: Diagnosis not present

## 2016-05-16 DIAGNOSIS — K469 Unspecified abdominal hernia without obstruction or gangrene: Secondary | ICD-10-CM | POA: Diagnosis not present

## 2016-05-16 DIAGNOSIS — K565 Intestinal adhesions [bands], unspecified as to partial versus complete obstruction: Secondary | ICD-10-CM | POA: Diagnosis not present

## 2016-05-16 DIAGNOSIS — E876 Hypokalemia: Secondary | ICD-10-CM | POA: Diagnosis not present

## 2016-05-16 DIAGNOSIS — I499 Cardiac arrhythmia, unspecified: Secondary | ICD-10-CM

## 2016-05-16 DIAGNOSIS — Z951 Presence of aortocoronary bypass graft: Secondary | ICD-10-CM | POA: Diagnosis not present

## 2016-05-16 DIAGNOSIS — E785 Hyperlipidemia, unspecified: Secondary | ICD-10-CM | POA: Diagnosis not present

## 2016-05-16 DIAGNOSIS — R112 Nausea with vomiting, unspecified: Secondary | ICD-10-CM | POA: Diagnosis not present

## 2016-05-16 DIAGNOSIS — E440 Moderate protein-calorie malnutrition: Secondary | ICD-10-CM | POA: Diagnosis not present

## 2016-05-16 DIAGNOSIS — I509 Heart failure, unspecified: Secondary | ICD-10-CM | POA: Diagnosis not present

## 2016-05-16 DIAGNOSIS — K219 Gastro-esophageal reflux disease without esophagitis: Secondary | ICD-10-CM | POA: Diagnosis not present

## 2016-05-16 DIAGNOSIS — Z48815 Encounter for surgical aftercare following surgery on the digestive system: Secondary | ICD-10-CM | POA: Diagnosis not present

## 2016-05-16 DIAGNOSIS — R008 Other abnormalities of heart beat: Secondary | ICD-10-CM | POA: Diagnosis not present

## 2016-05-16 DIAGNOSIS — K660 Peritoneal adhesions (postprocedural) (postinfection): Secondary | ICD-10-CM | POA: Diagnosis not present

## 2016-05-16 DIAGNOSIS — Z952 Presence of prosthetic heart valve: Secondary | ICD-10-CM | POA: Diagnosis not present

## 2016-05-16 DIAGNOSIS — I2510 Atherosclerotic heart disease of native coronary artery without angina pectoris: Secondary | ICD-10-CM | POA: Diagnosis not present

## 2016-05-16 DIAGNOSIS — I110 Hypertensive heart disease with heart failure: Secondary | ICD-10-CM | POA: Diagnosis not present

## 2016-05-16 DIAGNOSIS — Z4682 Encounter for fitting and adjustment of non-vascular catheter: Secondary | ICD-10-CM | POA: Diagnosis not present

## 2016-05-16 DIAGNOSIS — M6281 Muscle weakness (generalized): Secondary | ICD-10-CM | POA: Diagnosis not present

## 2016-05-16 DIAGNOSIS — Z681 Body mass index (BMI) 19 or less, adult: Secondary | ICD-10-CM | POA: Diagnosis not present

## 2016-05-16 DIAGNOSIS — K56609 Unspecified intestinal obstruction, unspecified as to partial versus complete obstruction: Secondary | ICD-10-CM | POA: Diagnosis not present

## 2016-05-16 DIAGNOSIS — I10 Essential (primary) hypertension: Secondary | ICD-10-CM | POA: Diagnosis not present

## 2016-05-16 DIAGNOSIS — R1031 Right lower quadrant pain: Secondary | ICD-10-CM | POA: Diagnosis not present

## 2016-05-16 DIAGNOSIS — K5651 Intestinal adhesions [bands], with partial obstruction: Secondary | ICD-10-CM | POA: Diagnosis not present

## 2016-05-16 DIAGNOSIS — E0590 Thyrotoxicosis, unspecified without thyrotoxic crisis or storm: Secondary | ICD-10-CM | POA: Diagnosis not present

## 2016-05-16 DIAGNOSIS — I498 Other specified cardiac arrhythmias: Secondary | ICD-10-CM

## 2016-05-16 DIAGNOSIS — I493 Ventricular premature depolarization: Secondary | ICD-10-CM | POA: Diagnosis not present

## 2016-05-16 DIAGNOSIS — I4891 Unspecified atrial fibrillation: Secondary | ICD-10-CM | POA: Diagnosis not present

## 2016-05-16 DIAGNOSIS — R002 Palpitations: Secondary | ICD-10-CM | POA: Diagnosis not present

## 2016-05-16 DIAGNOSIS — Z79899 Other long term (current) drug therapy: Secondary | ICD-10-CM | POA: Diagnosis not present

## 2016-05-16 LAB — BASIC METABOLIC PANEL
Anion gap: 5 (ref 5–15)
BUN: 9 mg/dL (ref 6–20)
CO2: 25 mmol/L (ref 22–32)
CREATININE: 0.8 mg/dL (ref 0.44–1.00)
Calcium: 9.3 mg/dL (ref 8.9–10.3)
Chloride: 107 mmol/L (ref 101–111)
GFR calc non Af Amer: >60 mL/min (ref >60)
Glucose, Bld: 92 mg/dL (ref 65–99)
POTASSIUM: 4.4 mmol/L (ref 3.5–5.1)
Sodium: 137 mmol/L (ref 135–145)

## 2016-05-16 LAB — GLUCOSE, CAPILLARY: GLUCOSE-CAPILLARY: 87 mg/dL (ref 65–99)

## 2016-05-16 LAB — TROPONIN I
Troponin I: <0.03 ng/mL (ref <0.03)
Troponin I: <0.03 ng/mL (ref <0.03)

## 2016-05-16 MED ORDER — DEXTROSE-NACL 5-0.9 % IV SOLN
INTRAVENOUS | Status: DC
  Administered 2016-05-16 – 2016-05-21 (×6): via INTRAVENOUS

## 2016-05-16 MED ORDER — FAMOTIDINE IN NACL 20-0.9 MG/50ML-% IV SOLN
20.0000 mg | Freq: Two times a day (BID) | INTRAVENOUS | Status: DC
  Administered 2016-05-16 – 2016-05-26 (×22): 20 mg via INTRAVENOUS
  Filled 2016-05-16 (×25): qty 50

## 2016-05-16 MED ORDER — ONDANSETRON HCL 4 MG/2ML IJ SOLN
4.0000 mg | Freq: Four times a day (QID) | INTRAMUSCULAR | Status: DC | PRN
  Administered 2016-05-16 – 2016-05-25 (×8): 4 mg via INTRAVENOUS
  Filled 2016-05-16 (×11): qty 2

## 2016-05-16 MED ORDER — PANTOPRAZOLE SODIUM 40 MG IV SOLR
40.0000 mg | Freq: Two times a day (BID) | INTRAVENOUS | Status: DC
  Administered 2016-05-16 – 2016-05-26 (×21): 40 mg via INTRAVENOUS
  Filled 2016-05-16 (×21): qty 40

## 2016-05-16 MED ORDER — FLEET ENEMA 7-19 GM/118ML RE ENEM
1.0000 | ENEMA | Freq: Once | RECTAL | Status: AC
  Administered 2016-05-16: 1 via RECTAL
  Filled 2016-05-16: qty 1

## 2016-05-16 MED ORDER — METOPROLOL TARTRATE 5 MG/5ML IV SOLN
2.5000 mg | Freq: Three times a day (TID) | INTRAVENOUS | Status: DC
  Administered 2016-05-16 – 2016-05-17 (×4): 2.5 mg via INTRAVENOUS
  Filled 2016-05-16 (×5): qty 5

## 2016-05-16 MED ORDER — LORAZEPAM 2 MG/ML IJ SOLN
0.5000 mg | Freq: Four times a day (QID) | INTRAMUSCULAR | Status: DC | PRN
  Administered 2016-05-16 – 2016-05-24 (×10): 0.5 mg via INTRAVENOUS
  Filled 2016-05-16 (×10): qty 1

## 2016-05-16 MED ORDER — LORAZEPAM 2 MG/ML IJ SOLN
0.5000 mg | Freq: Once | INTRAMUSCULAR | Status: AC
  Administered 2016-05-16: 0.5 mg via INTRAVENOUS
  Filled 2016-05-16: qty 1

## 2016-05-16 NOTE — Progress Notes (Addendum)
After suctioning good 2 hrs, no any significant results noticed (no any output), Per MD (Dr Lindie Spruce)  NG tube taken out, IV drip continue. Pt is on ice-chips, sips with meds now

## 2016-05-16 NOTE — Progress Notes (Signed)
New Admission Note:   Arrival Method: Bed Mental Orientation: AOX4 Telemetry:[id number] Assessment: Completed Skin: Intact IV: Right AC Pain:7/10 Tubes: None Safety Measures: Safety Fall Prevention Plan has been discussed  Admission:  [facility] [place] Orientation: Patient has been orientated to the room, unit and staff.  Family: none at bedside  Orders to be reviewed and implemented. Will continue to monitor the patient. Call light has been placed within reach and bed alarm has been activated.   Bettey Mare, RN Phone: [phone]

## 2016-05-16 NOTE — Progress Notes (Signed)
NG tube insertion done, pt tolerated well, after confirming with abdominal X-ray, NG is attached to Low intermittent suction, Morphine provided for complain of pain and IV Lorazepam given for anxiety, will continue to monitor the patient  Lonia Farber, RN

## 2016-05-16 NOTE — Progress Notes (Signed)
Pt. Complained of abdominal pain. PRN given and effective. NG tube refused. No nausea, vomiting or other concerns on the night shift. Will continue to monitor.  Jeyli Zwicker, RN

## 2016-05-16 NOTE — Progress Notes (Addendum)
PROGRESS NOTE    [name] [name]  XBM:[id number] DOB: 07/23/1940 DOA: 05/15/2016 PCP: Minda Meo, MD   Brief Narrative:  [name] [name] is a 76 y.o. female with medical history significant for hypertension, coronary artery disease, GERD, hyperthyroidism, and mitral valve prolapse status post MVR who presents to the emergency department with one-day vomiting and palpitations. Patient reports that she had been in her usual state of health until shortly after breakfast when she developed acute nausea with nonbloody nonbilious vomiting.   A CT of the abdomen and pelvis was obtained and demonstrates a small bowel obstruction with transition point in the mid abdomen, with findings suggestive of an internal hernia that site. Patient refused NG tube   Assessment & Plan:   Principal Problem:   SBO (small bowel obstruction) Active Problems:   Hypertension   Anxiety   S/P mitral valve repair   Status post Maze operation for atrial fibrillation   Intractable vomiting   Heart palpitations   Bigeminy  1-SBO;   CT abd/pelvis demonstrates SBO with findings suggestive of internal hernia. Transition point.  Patient still complaining of severe pain. Passed gas lat night, none today.  Had 2 small BM yesterday prior arriving to hospital.  Continue with IV fluids. NPO.  Surgery consulted. NG tube ordered.   2-Palpitation, Frequents PVC.  Improved after IV fluids..  Replete electrolytes.  Change coreg to metoprolol IV due to NPO status.   3-MVR, 2014 ECHO ; 2017: Mitral valve: An annular ring prosthesis was present. Mobility  was restricted. Transvalvular velocity was increased, due to stenosis. The findings are consistent with moderate stenosis. Cardiology consulted.    HTN; change coreg to metoprolol.   Anxiety;ativan PRN for anxiety.       DVT prophylaxis: lovenox Code Status: Full code.  Family Communication: Care discussed with patient  Disposition Plan: remain  inpatient.    Consultants:   Surgery  cardio   Procedures: none   Antimicrobials: none   Subjective: Complaining of abdominal pain. Has not pass gas this morning but did yesterday.  Vomited twice yesterday.   Objective: Vitals:   05/15/16 2130 05/15/16 2312 05/16/16 0224 05/16/16 0506  BP: (!) 149/107 (!) 147/109 131/61 118/64  Pulse: 67 88  81  Resp: 23 19 16 17   Temp:  97.4 F (36.3 C) 98.2 F (36.8 C) 97.9 F (36.6 C)  TempSrc:  Oral Oral Oral  SpO2: 100% 99% 98% 99%  Weight:  51.1 kg (112 lb 11.2 oz)  51.1 kg (112 lb 9.6 oz)  Height:  5\' 6"  (1.676 m)      Intake/Output Summary (Last 24 hours) at 05/16/16 0748 Last data filed at 05/16/16 0647  Gross per 24 hour  Intake             1715 ml  Output             1100 ml  Net              615 ml   Filed Weights   05/15/16 2312 05/16/16 0506  Weight: 51.1 kg (112 lb 11.2 oz) 51.1 kg (112 lb 9.6 oz)    Examination:  General exam: Appears calm and comfortable  Respiratory system: Clear to auscultation. Respiratory effort normal. Cardiovascular system: S1 & S2 heard, RRR. No JVD, murmurs, rubs, gallops or clicks. No pedal edema. Gastrointestinal system: Abdomen is nondistended, soft and tender left side.  No organomegaly or masses felt. Normal bowel sounds heard. Central nervous system: Alert and  oriented. No focal neurological deficits. Extremities: Symmetric 5 x 5 power. Skin: No rashes, lesions or ulcers    Data Reviewed: I have personally reviewed following labs and imaging studies  CBC:  Recent Labs Lab 05/15/16 1713  WBC 10.2  NEUTROABS 7.8*  HGB 12.8  HCT 38.6  MCV 88.3  PLT 209   Basic Metabolic Panel:  Recent Labs Lab 05/15/16 1713  NA 137  K 3.8  CL 103  CO2 25  GLUCOSE 99  BUN 11  CREATININE 0.76  CALCIUM 10.1  MG 1.8   GFR: Estimated Creatinine Clearance: 49.8 mL/min (by C-G formula based on SCr of 0.76 mg/dL). Liver Function Tests:  Recent Labs Lab 05/15/16 1713    AST 24  ALT 21  ALKPHOS 72  BILITOT 0.7  PROT 6.6  ALBUMIN 4.0    Recent Labs Lab 05/15/16 1713  LIPASE 24   No results for input(s): AMMONIA in the last 168 hours. Coagulation Profile: No results for input(s): INR, PROTIME in the last 168 hours. Cardiac Enzymes:  Recent Labs Lab 05/16/16 0139  TROPONINI <0.03   BNP (last 3 results) No results for input(s): PROBNP in the last 8760 hours. HbA1C: No results for input(s): HGBA1C in the last 72 hours. CBG:  Recent Labs Lab 05/16/16 0647  GLUCAP 87   Lipid Profile: No results for input(s): CHOL, HDL, LDLCALC, TRIG, CHOLHDL, LDLDIRECT in the last 72 hours. Thyroid Function Tests:  Recent Labs  05/15/16 1818  TSH 2.449   Anemia Panel: No results for input(s): VITAMINB12, FOLATE, FERRITIN, TIBC, IRON, RETICCTPCT in the last 72 hours. Sepsis Labs:  Recent Labs Lab 05/15/16 1728  LATICACIDVEN 0.68    No results found for this or any previous visit (from the past 240 hour(s)).       Radiology Studies: Ct Abdomen Pelvis W Contrast  Result Date: 05/15/2016 CLINICAL DATA:  76 y/o F; abdominal pain radiating to the back with vomiting. EXAM: CT ABDOMEN AND PELVIS WITH CONTRAST TECHNIQUE: Multidetector CT imaging of the abdomen and pelvis was performed using the standard protocol following bolus administration of intravenous contrast. CONTRAST:  1 ISOVUE-300 IOPAMIDOL (ISOVUE-300) INJECTION 61% COMPARISON:  09/22/2014 CT abdomen and pelvis FINDINGS: Lower chest: Cardiomegaly with biatrial enlargement. Mitral annuloplasty. Mild aortic valvular calcification. Hepatobiliary: Focal fat along falciform ligament. Normal gallbladder. No intra or extrahepatic biliary ductal dilatation. Pancreas: Unremarkable. No pancreatic ductal dilatation or surrounding inflammatory changes. Spleen: Punctate posterior calcification is stable compatible sequelae of prior granulomatous disease. Adrenals/Urinary Tract: Bilateral renal cysts  largest on the right extending inferiorly exophytic leak from the right kidney lower pole, decreased since from prior study. Normal adrenal glands. No urinary stone disease or obstructive uropathy. Normal bladder. Stomach/Bowel: Small bowel obstruction in the left hemiabdomen with transition point in left mid abdomen (series 5, image 24). There appears to be several loops of collapsed bowel that are edematous with small surrounding small volume of ascites in the left upper quadrant (series 5, image 39) and there appears to be afferent and effent limbs passing through the same region with mesentery suggesting an internal hernia (series 6, image 71). Diverticulosis without diverticulitis. Vascular/Lymphatic: Surgical clips in the right groin likely related to prior vascular access. Severe aortic atherosclerosis with concentric calcification no aortic aneurysm. No lymphadenopathy identified. Reproductive: Uterus and bilateral adnexa are unremarkable. Other: Trace volume of pelvic ascites. Musculoskeletal: Lumbar degenerative changes with prominent lower lumbar facet arthropathy. No acute osseous abnormality. IMPRESSION: 1. Small bowel obstruction with transition point in the  left mid abdomen. At the point of transition there are findings suggesting internal hernia of small bowel into the left posterior upper quadrant. 2. Cardiomegaly. 3. Aortic atherosclerosis. These results will be called to the ordering clinician or representative by the Radiologist Assistant, and communication documented in the PACS or zVision Dashboard. Electronically Signed   By: Mitzi Hansen M.D.   On: 05/15/2016 22:44        Scheduled Meds: . aspirin EC  81 mg Oral Q1400  . carvedilol  3.125 mg Oral BID WC  . cholecalciferol  2,000 Units Oral Daily  . enoxaparin (LOVENOX) injection  40 mg Subcutaneous Q24H  . famotidine (PEPCID) IV  20 mg Intravenous Q12H  . mouth rinse  15 mL Mouth Rinse BID  . methimazole  5 mg Oral  Daily  . omega-3 acid ethyl esters  1 g Oral Daily  . pantoprazole  40 mg Oral Daily  . sodium chloride flush  3 mL Intravenous Q12H   Continuous Infusions: . sodium chloride 100 mL/hr at 05/16/16 0001     LOS: 0 days    Time spent: 35 minutes.     Alba Cory, MD [facility] Pager [phone]  If 7PM-7AM, please contact night-coverage [email or web address] Password [facility] [place] 05/16/2016, 7:48 AM

## 2016-05-16 NOTE — Consult Note (Signed)
[facility] Consult/Admission Note  [name] [name] 01-19-1941  [id number].    Requesting MD: Dr. Myna Hidalgo Chief Complaint/Reason for Consult: SBO  HPI:  [name] [name] is a 76 y.o. female with PMHx significant for hypertension, coronary artery disease, GERD, anxiety, hyperthyroidism, and mitral valve prolapse status post MVR who presented to the [facility] ED with one-day vomiting and palpitations. Patient reports she developed abdominal pain shortly after breakfast yesterday morning. She describes the pain as cramping, "gas pains' that started in her mid abdomin and radiated into her back and left side of chest. Pain waxed and waned and was severe at times. Pt had a Bm yesterday around 3PM which was more than normal for her. She has a history of constipation. She tried gingerale and gas-ex which made her symptoms worse. Pt had one episode of vomiting before EMS arrived and another in the ED. No blood in her vomit and she states it was green. She has not eaten since yesterday morning. She had assocaited abdominal bloating which has resolved. Pt is having a small amount of flatus. Pt denies diarrhea, fever, chills, SOB. Current pt has no abdominal pain, bloating, nausea or vomiting.  Surgical history significant for tubal ligation and uterine fibroid removal    ED Course:  Labs unremarkable, VSS with mild tachycardia, afebrile. Troponin negative. Lactic acid WNL.  CT of the abdomen and pelvis showed a small bowel obstruction with transition point in the mid abdomen, with findings suggestive of an internal hernia that site.  Patient refused NG tube. She received fluids and Zofran. Tachycardia resolved.  ROS:  Review of Systems  Constitutional: Negative for chills, diaphoresis and fever.  Eyes: Negative for discharge.  Respiratory: Negative for cough and shortness of breath.   Cardiovascular: Positive for chest pain. Negative for leg swelling.  Gastrointestinal: Positive for abdominal  pain, constipation, nausea and vomiting. Negative for blood in stool and diarrhea.  Genitourinary: Negative for dysuria.  Skin: Negative for rash.  Neurological: Negative for dizziness and loss of consciousness.  All other systems reviewed and are negative.    Family History  Problem Relation Age of Onset  . Colon cancer Father     Past Medical History:  Diagnosis Date  . Acute coronary syndrome ([place]) 06/2015  . 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 Anemia   . Anxiety   . Aortic stenosis 05/19/2012   trivial by TEE  . Atrial fibrillation ([place])    a. Dx 04/2012;  b. Amio d/c'd 2/2 hyperthyroidism;  c. Atrial flutter 10/2012;  d. On xarelto.  . CHF (congestive heart failure) ([place])   . Complication of anesthesia   . Coronary artery disease   . Diverticulitis   . GERD (gastroesophageal reflux disease)   . Goiter   . Headache   . Heart murmur   . Hyperlipidemia    recently- taken off Crestor- due to pain in her legs, but this was after cardiac cath. , pt. questioning whether the pain in her legs was related to cath. or crestor  . Hypertension    Then hypotension 04/2012 limiting med adjustment.  . Microscopic hematuria 08/20/2012  . Mitral valve prolapse    a. H/o MVP, with severe MR 04/2012, had MV repair and Maze  . Pneumonia    treated /w antibiotic- [facility]- early Feb. 2014  . PONV (postoperative nausea and vomiting)   . Refusal of blood transfusions as patient is Jehovah's Witness   . S/P Maze operation for atrial fibrillation 06/28/2012  Complete bilateral atrial lesion set using cryothermy ablation with oversewing of LA appendage via right minithoracotomy  . S/P mitral valve repair 06/28/2012   Complex valvuloplasty including quadrangular resection of posterior leaflet, sliding leaflet plasty, artificial Gore-tex neocord placement x2 and 71m Sorin Memo 3D ring annuloplasty via right mini thoracotomy   . Shortness of breath     Past Surgical History:  Procedure  Laterality Date  . CARDIAC CATHETERIZATION N/A 07/16/2015   Procedure: Left Heart Cath and Coronary Angiography;  Surgeon: ADixie Dials MD;  [facility];  Service: Cardiovascular;  Laterality: N/A;  . CORONARY ARTERY BYPASS GRAFT    . INTRAOPERATIVE TRANSESOPHAGEAL ECHOCARDIOGRAM N/A 06/28/2012   Procedure: INTRAOPERATIVE TRANSESOPHAGEAL ECHOCARDIOGRAM;  Surgeon: CRexene Alberts MD;  [place] [facility]  Service: Open Heart Surgery;  Laterality: N/A;  . LEFT AND RIGHT HEART CATHETERIZATION WITH CORONARY ANGIOGRAM N/A 05/22/2012   Procedure: LEFT AND RIGHT HEART CATHETERIZATION WITH CORONARY ANGIOGRAM;  Surgeon: CBurnell Blanks MD;  [facility];  Service: Cardiovascular;  Laterality: N/A;  . MINIMALLY INVASIVE MAZE PROCEDURE N/A 06/28/2012   Procedure: MINIMALLY INVASIVE MAZE PROCEDURE;  Surgeon: CRexene Alberts MD;  [place] [facility]  Service: Open Heart Surgery;  Laterality: N/A;  . MITRAL VALVE REPAIR Right 06/28/2012   Procedure: MINIMALLY INVASIVE MITRAL VALVE REPAIR (MVR);  Surgeon: CRexene Alberts MD;  [place] [facility]  Service: Open Heart Surgery;  Laterality: Right;  . OVARIAN CYST SURGERY  1988  . TEE WITHOUT CARDIOVERSION  05/19/2012   Procedure: TRANSESOPHAGEAL ECHOCARDIOGRAM (TEE);  Surgeon: DJolaine Artist MD;  [place] [facility]  Service: Cardiovascular;  Laterality: N/A;  . TUBAL LIGATION  1974  . URETHRAL STRICTURE DILATATION    . UTERINE FIBROID SURGERY  1988  . `      Social History:  reports that she has never smoked. She has never used smokeless tobacco. She reports that she does not drink alcohol or use drugs.  Allergies:  Allergies  Allergen Reactions  . Amiodarone Other (See Comments)    Hyperthyroidism   . Diltiazem Other (See Comments)    Put into hospital for 3 days  . Macrobid [Nitrofurantoin Monohyd Macro] Nausea Only  . Aspirin Rash    Tolerates low dose aspirin  . Naproxen Sodium Swelling and Rash  . Sulfa Drugs Cross  Reactors Other (See Comments)    Allergy per mother - unknown reaction    Medications Prior to Admission  Medication Sig Dispense Refill  . acetaminophen (TYLENOL) 500 MG tablet Take 500 mg by mouth every 6 (six) hours as needed (pain).     .Marland KitchenALPRAZolam (XANAX) 0.25 MG tablet Take 0.125-0.25 mg by mouth 2 (two) times daily as needed for anxiety. For anxiety    . aspirin EC 81 MG tablet Take 81 mg by mouth daily at 2 PM.     . b complex vitamins tablet Take 1 tablet by mouth daily before lunch.    . carvedilol (COREG) 3.125 MG tablet Take 1 tablet (3.125 mg total) by mouth 2 (two) times daily with a meal. 60 tablet 3  . Cholecalciferol (VITAMIN D3) 2000 UNITS TABS Take 2,000 Units by mouth daily before lunch.     . Cyanocobalamin (VITAMIN B-12 PO) Take 1 tablet by mouth daily before lunch.    . fluticasone (FLONASE) 50 MCG/ACT nasal spray Place 2 sprays into the nose daily as needed for allergies.     . methimazole (TAPAZOLE) 5 MG tablet Take 5  mg by mouth daily.    . Omega-3 Fatty Acids (FISH OIL PO) Take 1 capsule by mouth daily before lunch.    [name] OVER THE COUNTER MEDICATION Place 1 drop into both eyes daily as needed (dry eyes/ irritation).    . pantoprazole (PROTONIX) 40 MG tablet Take 40 mg by mouth daily.     . Probiotic Product (PROBIOTIC DAILY) CAPS Take 1 capsule by mouth daily before lunch.     . traMADol (ULTRAM) 50 MG tablet Take 0.5 tablets (25 mg total) by mouth every 6 (six) hours as needed (pain).      Blood pressure 110/70, pulse 80, temperature 97.9 F (36.6 C), temperature source Oral, resp. rate 17, height _0  (1.676 m), weight 112 lb 9.6 oz (51.1 kg), SpO2 99 %.  Physical Exam  Constitutional: She is oriented to person, place, and time and well-developed, well-nourished, and in no distress. Vital signs are normal. No distress.  HENT:  Head: Normocephalic and atraumatic.  Nose: Nose normal.  Mouth/Throat: Oropharynx is clear and moist.  Eyes: Conjunctivae are  normal. Right eye exhibits no discharge. Left eye exhibits no discharge. No scleral icterus.  Neck: Normal range of motion. Neck supple.  Cardiovascular: Normal rate, regular rhythm, normal heart sounds and intact distal pulses.  Exam reveals no gallop and no friction rub.   No murmur heard. Pulses:      Radial pulses are 2+ on the right side, and 2+ on the left side.       Dorsalis pedis pulses are 2+ on the right side, and 2+ on the left side.  Pulmonary/Chest: Effort normal and breath sounds normal. No respiratory distress. She has no decreased breath sounds. She has no wheezes. She has no rhonchi. She has no rales.  Abdominal: Soft. Bowel sounds are normal. She exhibits no distension and no mass. There is no rebound and no guarding.  Very mild TTP to epigastric region and periumbilical region  Musculoskeletal: Normal range of motion. She exhibits no edema or deformity.  Neurological: She is alert and oriented to person, place, and time.  Skin: Skin is warm and dry. No rash noted. She is not diaphoretic.  Psychiatric: Mood and affect normal.  Nursing note and vitals reviewed.   Results for orders placed or performed during the hospital encounter of 05/15/16 (from the past 48 hour(s))  CBC with Differential     Status: Abnormal   Collection Time: 05/15/16  5:13 PM  Result Value Ref Range   WBC 10.2 4.0 - 10.5 K/uL   RBC 4.37 3.87 - 5.11 MIL/uL   Hemoglobin 12.8 12.0 - 15.0 g/dL   HCT 38.6 36.0 - 46.0 %   MCV 88.3 78.0 - 100.0 fL   MCH 29.3 26.0 - 34.0 pg   MCHC 33.2 30.0 - 36.0 g/dL   RDW 13.8 11.5 - 15.5 %   Platelets 209 150 - 400 K/uL   Neutrophils Relative % 76 %   Neutro Abs 7.8 (H) 1.7 - 7.7 K/uL   Lymphocytes Relative 18 %   Lymphs Abs 1.8 0.7 - 4.0 K/uL   Monocytes Relative 5 %   Monocytes Absolute 0.5 0.1 - 1.0 K/uL   Eosinophils Relative 1 %   Eosinophils Absolute 0.1 0.0 - 0.7 K/uL   Basophils Relative 0 %   Basophils Absolute 0.0 0.0 - 0.1 K/uL  Comprehensive  metabolic panel     Status: None   Collection Time: 05/15/16  5:13 PM  Result Value Ref Range  Sodium 137 135 - 145 mmol/L   Potassium 3.8 3.5 - 5.1 mmol/L   Chloride 103 101 - 111 mmol/L   CO2 25 22 - 32 mmol/L   Glucose, Bld 99 65 - 99 mg/dL   BUN 11 6 - 20 mg/dL   Creatinine, Ser 0.76 0.44 - 1.00 mg/dL   Calcium 10.1 8.9 - 10.3 mg/dL   Total Protein 6.6 6.5 - 8.1 g/dL   Albumin 4.0 3.5 - 5.0 g/dL   AST 24 15 - 41 U/L   ALT 21 14 - 54 U/L   Alkaline Phosphatase 72 38 - 126 U/L   Total Bilirubin 0.7 0.3 - 1.2 mg/dL   GFR calc non Af Amer >60 >60 mL/min   GFR calc Af Amer >60 >60 mL/min    Comment: (NOTE) The eGFR has been calculated using the CKD EPI equation. This calculation has not been validated in all clinical situations. eGFR's persistently <60 mL/min signify possible Chronic Kidney Disease.    Anion gap 9 5 - 15  Lipase, blood     Status: None   Collection Time: 05/15/16  5:13 PM  Result Value Ref Range   Lipase 24 11 - 51 U/L  Magnesium     Status: None   Collection Time: 05/15/16  5:13 PM  Result Value Ref Range   Magnesium 1.8 1.7 - 2.4 mg/dL  I-Stat Troponin, ED (not at [facility])     Status: None   Collection Time: 05/15/16  5:26 PM  Result Value Ref Range   Troponin i, poc 0.00 0.00 - 0.08 ng/mL   Comment 3            Comment: Due to the release kinetics of cTnI, a negative result within the first hours of the onset of symptoms does not rule out myocardial infarction with certainty. If myocardial infarction is still suspected, repeat the test at appropriate intervals.   I-Stat CG4 Lactic Acid, ED     Status: None   Collection Time: 05/15/16  5:28 PM  Result Value Ref Range   Lactic Acid, Venous 0.68 0.5 - 1.9 mmol/L  TSH     Status: None   Collection Time: 05/15/16  6:18 PM  Result Value Ref Range   TSH 2.449 0.350 - 4.500 uIU/mL    Comment: Performed by a 3rd Generation assay with a functional sensitivity of <=0.01 uIU/mL.  Urinalysis, Routine w reflex  microscopic     Status: Abnormal   Collection Time: 05/15/16  9:46 PM  Result Value Ref Range   Color, Urine YELLOW YELLOW   APPearance CLEAR CLEAR   Specific Gravity, Urine 1.015 1.005 - 1.030   pH 7.0 5.0 - 8.0   Glucose, UA NEGATIVE NEGATIVE mg/dL   Hgb urine dipstick MODERATE (A) NEGATIVE   Bilirubin Urine NEGATIVE NEGATIVE   Ketones, ur 15 (A) NEGATIVE mg/dL   Protein, ur NEGATIVE NEGATIVE mg/dL   Nitrite NEGATIVE NEGATIVE   Leukocytes, UA NEGATIVE NEGATIVE  Urinalysis, Microscopic (reflex)     Status: Abnormal   Collection Time: 05/15/16  9:46 PM  Result Value Ref Range   RBC / HPF 6-30 0 - 5 RBC/hpf   WBC, UA 0-5 0 - 5 WBC/hpf   Bacteria, UA RARE (A) NONE SEEN   Squamous Epithelial / LPF 0-5 (A) NONE SEEN  Troponin I     Status: None   Collection Time: 05/16/16  1:39 AM  Result Value Ref Range   Troponin I <0.03 <0.03 ng/mL  Glucose,  capillary     Status: None   Collection Time: 05/16/16  6:47 AM  Result Value Ref Range   Glucose-Capillary 87 65 - 99 mg/dL   Comment 1 Notify RN    Comment 2 Document in Chart   Troponin I     Status: None   Collection Time: 05/16/16  7:11 AM  Result Value Ref Range   Troponin I <0.03 <0.03 ng/mL  Basic metabolic panel     Status: None   Collection Time: 05/16/16  7:11 AM  Result Value Ref Range   Sodium 137 135 - 145 mmol/L   Potassium 4.4 3.5 - 5.1 mmol/L   Chloride 107 101 - 111 mmol/L   CO2 25 22 - 32 mmol/L   Glucose, Bld 92 65 - 99 mg/dL   BUN 9 6 - 20 mg/dL   Creatinine, Ser 0.80 0.44 - 1.00 mg/dL   Calcium 9.3 8.9 - 10.3 mg/dL   GFR calc non Af Amer >60 >60 mL/min   GFR calc Af Amer >60 >60 mL/min    Comment: (NOTE) The eGFR has been calculated using the CKD EPI equation. This calculation has not been validated in all clinical situations. eGFR's persistently <60 mL/min signify possible Chronic Kidney Disease.    Anion gap 5 5 - 15   Ct Abdomen Pelvis W Contrast  Result Date: 05/15/2016 CLINICAL DATA:  76 y/o F;  abdominal pain radiating to the back with vomiting. EXAM: CT ABDOMEN AND PELVIS WITH CONTRAST TECHNIQUE: Multidetector CT imaging of the abdomen and pelvis was performed using the standard protocol following bolus administration of intravenous contrast. CONTRAST:  1 ISOVUE-300 IOPAMIDOL (ISOVUE-300) INJECTION 61% COMPARISON:  09/22/2014 CT abdomen and pelvis FINDINGS: Lower chest: Cardiomegaly with biatrial enlargement. Mitral annuloplasty. Mild aortic valvular calcification. Hepatobiliary: Focal fat along falciform ligament. Normal gallbladder. No intra or extrahepatic biliary ductal dilatation. Pancreas: Unremarkable. No pancreatic ductal dilatation or surrounding inflammatory changes. Spleen: Punctate posterior calcification is stable compatible sequelae of prior granulomatous disease. Adrenals/Urinary Tract: Bilateral renal cysts largest on the right extending inferiorly exophytic leak from the right kidney lower pole, decreased since from prior study. Normal adrenal glands. No urinary stone disease or obstructive uropathy. Normal bladder. Stomach/Bowel: Small bowel obstruction in the left hemiabdomen with transition point in left mid abdomen (series 5, image 24). There appears to be several loops of collapsed bowel that are edematous with small surrounding small volume of ascites in the left upper quadrant (series 5, image 39) and there appears to be afferent and effent limbs passing through the same region with mesentery suggesting an internal hernia (series 6, image 71). Diverticulosis without diverticulitis. Vascular/Lymphatic: Surgical clips in the right groin likely related to prior vascular access. Severe aortic atherosclerosis with concentric calcification no aortic aneurysm. No lymphadenopathy identified. Reproductive: Uterus and bilateral adnexa are unremarkable. Other: Trace volume of pelvic ascites. Musculoskeletal: Lumbar degenerative changes with prominent lower lumbar facet arthropathy. No acute  osseous abnormality. IMPRESSION: 1. Small bowel obstruction with transition point in the left mid abdomen. At the point of transition there are findings suggesting internal hernia of small bowel into the left posterior upper quadrant. 2. Cardiomegaly. 3. Aortic atherosclerosis. These results will be called to the ordering clinician or representative by the Radiologist Assistant, and communication documented in the PACS or zVision Dashboard. Electronically Signed   By: Kristine Garbe M.D.   On: 05/15/2016 22:44      Assessment/Plan  SBO - CT scan: Small bowel obstruction with transition point in the  left mid abdomen. At the point of transition there are findings suggesting internal hernia of small bowel into the left posterior upper quadrant. - NGT, NPO, IVF - no nausea or vomiting since admission, benign abdominal exam - Abd film for NGT placement showed improving bowel gas pattern  Not sure pt is truly obstructed. She is not having nausea, vomiting, abdominal pain, and is having small amt of flatus. Will monitor NGT outpt. If minimal oupt overnight would clamp tube or advance to clears.  We will continue to follow this pt. Thank you for the consult.     Kalman Drape, [facility] 05/16/2016, 9:22 AM Pager: [phone] Consults: [phone] Mon-Fri 7:00 am-4:30 pm Sat-Sun 7:00 am-11:30 am

## 2016-05-16 NOTE — Progress Notes (Addendum)
Talked with doctor Opyd, T. Informed, if patient is refusing NG tube  to discontinue the order. Pt is refusing NG tube at this time. Will continue to monitor.  Lindsay Soulliere, RN

## 2016-05-17 ENCOUNTER — Inpatient Hospital Stay (HOSPITAL_COMMUNITY): Payer: PPO

## 2016-05-17 DIAGNOSIS — E440 Moderate protein-calorie malnutrition: Secondary | ICD-10-CM | POA: Insufficient documentation

## 2016-05-17 LAB — GLUCOSE, CAPILLARY: Glucose-Capillary: 157 mg/dL — ABNORMAL HIGH (ref 65–99)

## 2016-05-17 LAB — CBC
HEMATOCRIT: 37.2 % (ref 36.0–46.0)
HEMOGLOBIN: 12.1 g/dL (ref 12.0–15.0)
MCH: 28.6 pg (ref 26.0–34.0)
MCHC: 32.5 g/dL (ref 30.0–36.0)
MCV: 87.9 fL (ref 78.0–100.0)
Platelets: 222 10*3/uL (ref 150–400)
RBC: 4.23 MIL/uL (ref 3.87–5.11)
RDW: 13.9 % (ref 11.5–15.5)
WBC: 10.1 10*3/uL (ref 4.0–10.5)

## 2016-05-17 LAB — BASIC METABOLIC PANEL
Anion gap: 9 (ref 5–15)
BUN: 7 mg/dL (ref 6–20)
CHLORIDE: 108 mmol/L (ref 101–111)
CO2: 22 mmol/L (ref 22–32)
CREATININE: 0.7 mg/dL (ref 0.44–1.00)
Calcium: 9 mg/dL (ref 8.9–10.3)
GFR calc Af Amer: >60 mL/min (ref >60)
GFR calc non Af Amer: >60 mL/min (ref >60)
Glucose, Bld: 159 mg/dL — ABNORMAL HIGH (ref 65–99)
Potassium: 3.5 mmol/L (ref 3.5–5.1)
SODIUM: 139 mmol/L (ref 135–145)

## 2016-05-17 LAB — MAGNESIUM: Magnesium: 1.6 mg/dL — ABNORMAL LOW (ref 1.7–2.4)

## 2016-05-17 MED ORDER — BOOST / RESOURCE BREEZE PO LIQD
1.0000 | Freq: Two times a day (BID) | ORAL | Status: DC
  Administered 2016-05-17 – 2016-05-26 (×8): 1 via ORAL

## 2016-05-17 MED ORDER — ZOLPIDEM TARTRATE 5 MG PO TABS
5.0000 mg | ORAL_TABLET | Freq: Every evening | ORAL | Status: DC | PRN
  Administered 2016-05-17 – 2016-05-25 (×3): 5 mg via ORAL
  Filled 2016-05-17 (×3): qty 1

## 2016-05-17 MED ORDER — POTASSIUM CHLORIDE CRYS ER 20 MEQ PO TBCR
20.0000 meq | EXTENDED_RELEASE_TABLET | Freq: Once | ORAL | Status: AC
  Administered 2016-05-17: 20 meq via ORAL
  Filled 2016-05-17: qty 1

## 2016-05-17 MED ORDER — CARVEDILOL 3.125 MG PO TABS
3.1250 mg | ORAL_TABLET | Freq: Two times a day (BID) | ORAL | Status: DC
  Administered 2016-05-17 – 2016-05-19 (×4): 3.125 mg via ORAL
  Filled 2016-05-17 (×4): qty 1

## 2016-05-17 MED ORDER — MAGNESIUM SULFATE 2 GM/50ML IV SOLN
2.0000 g | Freq: Once | INTRAVENOUS | Status: AC
  Administered 2016-05-17: 2 g via INTRAVENOUS
  Filled 2016-05-17: qty 50

## 2016-05-17 NOTE — Progress Notes (Signed)
PROGRESS NOTE    ABRISH CHUGHTAI  VZS:[id number] DOB: [birth year] DOA: 05/15/2016 PCP: Minda Meo, MD   Brief Narrative:  [name] [name] is a 76 y.o. female with medical history significant for hypertension, coronary artery disease, GERD, hyperthyroidism, and mitral valve prolapse status post MVR who presents to the emergency department with one-day vomiting and palpitations. Patient reports that she had been in her usual state of health until shortly after breakfast when she developed acute nausea with nonbloody nonbilious vomiting.   A CT of the abdomen and pelvis was obtained and demonstrates a small bowel obstruction with transition point in the mid abdomen, with findings suggestive of an internal hernia that site. Patient refused NG tube   Assessment & Plan:   Principal Problem:   SBO (small bowel obstruction) Active Problems:   Hypertension   Anxiety   S/P mitral valve repair   Status post Maze operation for atrial fibrillation   Intractable vomiting   Heart palpitations   Bigeminy   Malnutrition of moderate degree  1-SBO;   CT abd/pelvis demonstrates SBO with findings suggestive of internal hernia. Transition point.  Patient still complaining of severe pain. Passed gas lat night, none today.  Continue with IV fluids. Surgery consulted and following. . Had multiples BM overnight. KUB still with SBO pattern.  Started on clear diet.   2-Palpitation, Frequents PVC.  Improved after IV fluids..  Replete electrolytes.  Resume coreg.   3-MVR, 2014 ECHO ; 2017: Mitral valve: An annular ring prosthesis was present. Mobility  was restricted. Transvalvular velocity was increased, due to stenosis. The findings are consistent with moderate stenosis.   HTN; resume coreg.   Anxiety;ativan PRN for anxiety.   Hypomagnesemia; replete mg.     DVT prophylaxis: lovenox Code Status: Full code.  Family Communication: Care discussed with patient  Disposition Plan:  remain inpatient.    Consultants:   Surgery  cardio   Procedures: none   Antimicrobials: none   Subjective: Had multiples BM last night.  Mild abdominal soreness. Mild nausea.   Objective: Vitals:   05/16/16 1220 05/16/16 2118 05/17/16 0544 05/17/16 1139  BP: (!) 147/62 (!) 144/62 (!) 148/75 (!) 151/66  Pulse: 80 81 88 (!) 41  Resp: 18 17 18 18   Temp: 98 F (36.7 C) 98.8 F (37.1 C) 98.4 F (36.9 C) 99.2 F (37.3 C)  TempSrc: Oral Oral Oral Oral  SpO2: 100% 100% 97% 99%  Weight:   53.7 kg (118 lb 6.4 oz)   Height:        Intake/Output Summary (Last 24 hours) at 05/17/16 1527 Last data filed at 05/17/16 1127  Gross per 24 hour  Intake             1880 ml  Output             2702 ml  Net             -822 ml   Filed Weights   05/15/16 2312 05/16/16 0506 05/17/16 0544  Weight: 51.1 kg (112 lb 11.2 oz) 51.1 kg (112 lb 9.6 oz) 53.7 kg (118 lb 6.4 oz)    Examination:  General exam: Appears calm and comfortable  Respiratory system: Clear to auscultation. Respiratory effort normal. Cardiovascular system: S1 & S2 heard, RRR. No JVD, murmurs, rubs, gallops or clicks. No pedal edema. Gastrointestinal system: Abdomen is nondistended, soft and tender left side.  No organomegaly or masses felt. Normal bowel sounds heard. Central nervous system: Alert and oriented. No  focal neurological deficits. Extremities: Symmetric 5 x 5 power. Skin: No rashes, lesions or ulcers    Data Reviewed: I have personally reviewed following labs and imaging studies  CBC:  Recent Labs Lab 05/15/16 1713 05/17/16 0459  WBC 10.2 10.1  NEUTROABS 7.8*  --   HGB 12.8 12.1  HCT 38.6 37.2  MCV 88.3 87.9  PLT 209 222   Basic Metabolic Panel:  Recent Labs Lab 05/15/16 1713 05/16/16 0711 05/17/16 0459  NA 137 137 139  K 3.8 4.4 3.5  CL 103 107 108  CO2 25 25 22   GLUCOSE 99 92 159*  BUN 11 9 7   CREATININE 0.76 0.80 0.70  CALCIUM 10.1 9.3 9.0  MG 1.8  --  1.6*    GFR: Estimated Creatinine Clearance: 52.3 mL/min (by C-G formula based on SCr of 0.7 mg/dL). Liver Function Tests:  Recent Labs Lab 05/15/16 1713  AST 24  ALT 21  ALKPHOS 72  BILITOT 0.7  PROT 6.6  ALBUMIN 4.0    Recent Labs Lab 05/15/16 1713  LIPASE 24   No results for input(s): AMMONIA in the last 168 hours. Coagulation Profile: No results for input(s): INR, PROTIME in the last 168 hours. Cardiac Enzymes:  Recent Labs Lab 05/16/16 0139 05/16/16 0711  TROPONINI <0.03 <0.03   BNP (last 3 results) No results for input(s): PROBNP in the last 8760 hours. HbA1C: No results for input(s): HGBA1C in the last 72 hours. CBG:  Recent Labs Lab 05/16/16 0647 05/17/16 0558  GLUCAP 87 157*   Lipid Profile: No results for input(s): CHOL, HDL, LDLCALC, TRIG, CHOLHDL, LDLDIRECT in the last 72 hours. Thyroid Function Tests:  Recent Labs  05/15/16 1818  TSH 2.449   Anemia Panel: No results for input(s): VITAMINB12, FOLATE, FERRITIN, TIBC, IRON, RETICCTPCT in the last 72 hours. Sepsis Labs:  Recent Labs Lab 05/15/16 1728  LATICACIDVEN 0.68    No results found for this or any previous visit (from the past 240 hour(s)).       Radiology Studies: Dg Abd 1 View  Result Date: 05/17/2016 CLINICAL DATA:  76 year old female with right lower quadrant pain, nausea, vomiting and constipation. History of diverticulosis. Subsequent encounter. EXAM: ABDOMEN - 1 VIEW COMPARISON:  05/16/2016 plain film exam.  05/15/2016 CT. FINDINGS: Nasogastric tube has been removed. Progressive increase in number of gas distended small bowel loops with slightly thickened folds consistent with small bowel obstruction. Stool within the right colon. The possibility of free intraperitoneal air cannot be assessed on a supine view. Mild scoliosis lumbar spine convex right possibly related to splinting. IMPRESSION: Nasogastric tube has been removed. Progressive increase in number of gas distended  small bowel loops with slightly thickened folds consistent with small bowel obstruction. Electronically Signed   By: Lacy Duverney M.D.   On: 05/17/2016 09:35   Ct Abdomen Pelvis W Contrast  Result Date: 05/15/2016 CLINICAL DATA:  76 y/o F; abdominal pain radiating to the back with vomiting. EXAM: CT ABDOMEN AND PELVIS WITH CONTRAST TECHNIQUE: Multidetector CT imaging of the abdomen and pelvis was performed using the standard protocol following bolus administration of intravenous contrast. CONTRAST:  1 ISOVUE-300 IOPAMIDOL (ISOVUE-300) INJECTION 61% COMPARISON:  09/22/2014 CT abdomen and pelvis FINDINGS: Lower chest: Cardiomegaly with biatrial enlargement. Mitral annuloplasty. Mild aortic valvular calcification. Hepatobiliary: Focal fat along falciform ligament. Normal gallbladder. No intra or extrahepatic biliary ductal dilatation. Pancreas: Unremarkable. No pancreatic ductal dilatation or surrounding inflammatory changes. Spleen: Punctate posterior calcification is stable compatible sequelae of prior  granulomatous disease. Adrenals/Urinary Tract: Bilateral renal cysts largest on the right extending inferiorly exophytic leak from the right kidney lower pole, decreased since from prior study. Normal adrenal glands. No urinary stone disease or obstructive uropathy. Normal bladder. Stomach/Bowel: Small bowel obstruction in the left hemiabdomen with transition point in left mid abdomen (series 5, image 24). There appears to be several loops of collapsed bowel that are edematous with small surrounding small volume of ascites in the left upper quadrant (series 5, image 39) and there appears to be afferent and effent limbs passing through the same region with mesentery suggesting an internal hernia (series 6, image 71). Diverticulosis without diverticulitis. Vascular/Lymphatic: Surgical clips in the right groin likely related to prior vascular access. Severe aortic atherosclerosis with concentric calcification no  aortic aneurysm. No lymphadenopathy identified. Reproductive: Uterus and bilateral adnexa are unremarkable. Other: Trace volume of pelvic ascites. Musculoskeletal: Lumbar degenerative changes with prominent lower lumbar facet arthropathy. No acute osseous abnormality. IMPRESSION: 1. Small bowel obstruction with transition point in the left mid abdomen. At the point of transition there are findings suggesting internal hernia of small bowel into the left posterior upper quadrant. 2. Cardiomegaly. 3. Aortic atherosclerosis. These results will be called to the ordering clinician or representative by the Radiologist Assistant, and communication documented in the PACS or zVision Dashboard. Electronically Signed   By: Mitzi Hansen M.D.   On: 05/15/2016 22:44   Dg Abd Portable 1v  Result Date: 05/16/2016 CLINICAL DATA:  Small bowel obstruction.  Evaluate NG tube. EXAM: PORTABLE ABDOMEN - 1 VIEW COMPARISON:  CT scan from yesterday FINDINGS: An NG tube terminates in the stomach. A mildly prominent air-filled loop of small bowel in the left pelvis measures 3.2 cm. The bowel gas pattern is otherwise unremarkable. Contrast is seen the bladder consistent with yesterday's CT scan. Oval high attenuation in the right upper abdomen is probably in the right renal collecting system. No other acute abnormalities. IMPRESSION: Appropriate placement of NG tube.  Improving bowel gas pattern. Electronically Signed   By: Gerome Sam III M.D   On: 05/16/2016 10:15        Scheduled Meds: . enoxaparin (LOVENOX) injection  40 mg Subcutaneous Q24H  . famotidine (PEPCID) IV  20 mg Intravenous Q12H  . feeding supplement  1 Container Oral BID BM  . mouth rinse  15 mL Mouth Rinse BID  . metoprolol  2.5 mg Intravenous Q8H  . pantoprazole (PROTONIX) IV  40 mg Intravenous Q12H  . sodium chloride flush  3 mL Intravenous Q12H   Continuous Infusions: . dextrose 5 % and 0.9% NaCl 100 mL/hr at 05/16/16 2045     LOS: 1  day    Time spent: 35 minutes.     Alba Cory, MD [facility] Pager [phone]  If 7PM-7AM, please contact night-coverage [email or web address] Password [facility] 05/17/2016, 3:27 PM

## 2016-05-17 NOTE — Progress Notes (Signed)
Calls received from both the patient's son and daughter, within 4 minutes of one another.  Both children updated on patient's status; multiple bowel movements, advancing diet to clear liquids today.   Both children state that they are not coming to pick her up, until she is 'discharged' as a response to the pt's cries for departure to them.  Per pt's daughter, if there is a need for family contact, or pick up do not call her until after 1300 today.   Son, Dannielle Huh, can be contacted at [phone].

## 2016-05-17 NOTE — Progress Notes (Signed)
Pt en route to radiology for abdominal xray

## 2016-05-17 NOTE — Progress Notes (Signed)
[facility] Progress Note     Subjective: Pt states she feels like she is in a prison cause she can't have water or get out of bed. She has not had any nausea or vomiting overnight. She endorses mild abdominal pain. Pt is having flatus and feels like she could have a BM.  Objective: Vital signs in last 24 hours: Temp:  [98 F (36.7 C)-98.8 F (37.1 C)] 98.4 F (36.9 C) (01/22 0544) Pulse Rate:  [40-88] 88 (01/22 0544) Resp:  [17-18] 18 (01/22 0544) BP: (110-148)/(62-75) 148/75 (01/22 0544) SpO2:  [97 %-100 %] 97 % (01/22 0544) Weight:  [118 lb 6.4 oz (53.7 kg)] 118 lb 6.4 oz (53.7 kg) (01/22 0544) Last BM Date: 05/17/16  Intake/Output from previous day: 01/21 0701 - 01/22 0700 In: 1880 [I.V.:1780; IV Piggyback:100] Out: 2102 [Urine:2100; Stool:2] Intake/Output this shift: No intake/output data recorded.  PE: Constitutional: She is oriented to person, place, and time and well-developed, well-nourished, and in no distress. Appears anxious  Mouth/Throat: Oropharynx is clear and moist. Lips are dry .  Neck: Normal range of motion. Neck supple.  Cardiovascular: Normal rate, regular rhythm, normal heart sounds. No murmur heard. Pulmonary/Chest: Effort normal  No respiratory distress.  Abdominal: Soft. Bowel sounds are normal. She exhibits no distension. There is no rebound and no guarding.  Very mild TTP to RLQ and periumbilical region  Skin: Skin is warm and dry. She is not diaphoretic.  Psychiatric: Mood and affect normal.  Nursing note and vitals reviewed.  Lab Results:   Recent Labs  05/15/16 1713 05/17/16 0459  WBC 10.2 10.1  HGB 12.8 12.1  HCT 38.6 37.2  PLT 209 222   BMET  Recent Labs  05/16/16 0711 05/17/16 0459  NA 137 139  K 4.4 3.5  CL 107 108  CO2 25 22  GLUCOSE 92 159*  BUN 9 7  CREATININE 0.80 0.70  CALCIUM 9.3 9.0   PT/INR No results for input(s): LABPROT, INR in the last 72 hours. CMP     Component Value Date/Time   NA 139  05/17/2016 0459   K 3.5 05/17/2016 0459   CL 108 05/17/2016 0459   CO2 22 05/17/2016 0459   GLUCOSE 159 (H) 05/17/2016 0459   BUN 7 05/17/2016 0459   CREATININE 0.70 05/17/2016 0459   CREATININE 0.73 11/02/2014 1316   CALCIUM 9.0 05/17/2016 0459   PROT 6.6 05/15/2016 1713   ALBUMIN 4.0 05/15/2016 1713   AST 24 05/15/2016 1713   ALT 21 05/15/2016 1713   ALKPHOS 72 05/15/2016 1713   BILITOT 0.7 05/15/2016 1713   GFRNONAA >60 05/17/2016 0459   GFRNONAA 83 09/22/2014 1615   GFRAA >60 05/17/2016 0459   GFRAA >89 09/22/2014 1615   Lipase     Component Value Date/Time   LIPASE 24 05/15/2016 1713       Studies/Results: Ct Abdomen Pelvis W Contrast  Result Date: 05/15/2016 CLINICAL DATA:  76 y/o F; abdominal pain radiating to the back with vomiting. EXAM: CT ABDOMEN AND PELVIS WITH CONTRAST TECHNIQUE: Multidetector CT imaging of the abdomen and pelvis was performed using the standard protocol following bolus administration of intravenous contrast. CONTRAST:  1 ISOVUE-300 IOPAMIDOL (ISOVUE-300) INJECTION 61% COMPARISON:  09/22/2014 CT abdomen and pelvis FINDINGS: Lower chest: Cardiomegaly with biatrial enlargement. Mitral annuloplasty. Mild aortic valvular calcification. Hepatobiliary: Focal fat along falciform ligament. Normal gallbladder. No intra or extrahepatic biliary ductal dilatation. Pancreas: Unremarkable. No pancreatic ductal dilatation or surrounding inflammatory changes. Spleen: Punctate posterior calcification  is stable compatible sequelae of prior granulomatous disease. Adrenals/Urinary Tract: Bilateral renal cysts largest on the right extending inferiorly exophytic leak from the right kidney lower pole, decreased since from prior study. Normal adrenal glands. No urinary stone disease or obstructive uropathy. Normal bladder. Stomach/Bowel: Small bowel obstruction in the left hemiabdomen with transition point in left mid abdomen (series 5, image 24). There appears to be several  loops of collapsed bowel that are edematous with small surrounding small volume of ascites in the left upper quadrant (series 5, image 39) and there appears to be afferent and effent limbs passing through the same region with mesentery suggesting an internal hernia (series 6, image 71). Diverticulosis without diverticulitis. Vascular/Lymphatic: Surgical clips in the right groin likely related to prior vascular access. Severe aortic atherosclerosis with concentric calcification no aortic aneurysm. No lymphadenopathy identified. Reproductive: Uterus and bilateral adnexa are unremarkable. Other: Trace volume of pelvic ascites. Musculoskeletal: Lumbar degenerative changes with prominent lower lumbar facet arthropathy. No acute osseous abnormality. IMPRESSION: 1. Small bowel obstruction with transition point in the left mid abdomen. At the point of transition there are findings suggesting internal hernia of small bowel into the left posterior upper quadrant. 2. Cardiomegaly. 3. Aortic atherosclerosis. These results will be called to the ordering clinician or representative by the Radiologist Assistant, and communication documented in the PACS or zVision Dashboard. Electronically Signed   By: Mitzi Hansen M.D.   On: 05/15/2016 22:44   Dg Abd Portable 1v  Result Date: 05/16/2016 CLINICAL DATA:  Small bowel obstruction.  Evaluate NG tube. EXAM: PORTABLE ABDOMEN - 1 VIEW COMPARISON:  CT scan from yesterday FINDINGS: An NG tube terminates in the stomach. A mildly prominent air-filled loop of small bowel in the left pelvis measures 3.2 cm. The bowel gas pattern is otherwise unremarkable. Contrast is seen the bladder consistent with yesterday's CT scan. Oval high attenuation in the right upper abdomen is probably in the right renal collecting system. No other acute abnormalities. IMPRESSION: Appropriate placement of NG tube.  Improving bowel gas pattern. Electronically Signed   By: Gerome Sam III M.D   On:  05/16/2016 10:15    Anti-infectives: Anti-infectives    None       Assessment/Plan  SBO - CT scan: Small bowel obstruction with transition point in the left mid abdomen. At the point of transition there are findings suggesting internal hernia of small bowel into the left posterior upper quadrant. - NGT had little to no output when placed, it has been pulled - no nausea or vomiting since admission, benign abdominal exam - Abd film for NGT placement showed improving bowel gas pattern  Plan: Not sure pt is truly obstructed. She is not having nausea, vomiting, and is having flatus. Will advance to clears and advance her diet as tolerated. We will continue to follow.     LOS: 1 day    Jerre Simon , [facility] 05/17/2016, 8:14 AM Pager: [phone] Consults: [phone] Mon-Fri 7:00 am-4:30 pm Sat-Sun 7:00 am-11:30 am

## 2016-05-17 NOTE — Progress Notes (Addendum)
Initial Nutrition Assessment  DOCUMENTATION CODES:   Non-severe (moderate) malnutrition in context of chronic illness, Underweight  INTERVENTION:  Provide Boost Breeze po BID, each supplement provides 250 kcal and 9 grams of protein  Change supplement to Ensure Enlive and Breakfast Essentials when diet is advanced   NUTRITION DIAGNOSIS:   Inadequate oral intake related to poor appetite as evidenced by mild depletion of body fat, mild depletion of muscle mass.   GOAL:   Patient will meet greater than or equal to 90% of their needs   MONITOR:   PO intake, Supplement acceptance, Labs, Weight trends, Skin  REASON FOR ASSESSMENT:   Malnutrition Screening Tool    ASSESSMENT:   76 y.o. female with medical history significant for hypertension, coronary artery disease, GERD, hyperthyroidism, and mitral valve prolapse status post MVR who presents to the emergency department with one-day vomiting and palpitations. Patient reports that she had been in her usual state of health until shortly after breakfast when she developed acute nausea with nonbloody nonbilious vomiting. A CT of the abdomen and pelvis was obtained and demonstrates a small bowel obstruction with transition point in the mid abdomen, with findings suggestive of an internal hernia that site.  Pt states that she used to weight more, but for the past year she has been weighing between 113 and 115 lbs. She denies any recent weight loss and reports eating well PTA with 3 daily meals and the occasional nutritional protein shake (vanilla protein powder with milk). Pt was NPO 1/20-1/21 and advanced to clear liquids this morning. She denies any abdominal pain or nausea after consuming clear liquids. She has mild muscle wasting of clavicles, acromion, patellar region, and thigh and mild fat wasting of arm. She is agreeable to trying [employer] today. Pt is underweight based on admission weight of 112 lbs.   Labs: low  magnesium  Diet Order:  Diet clear liquid Room service appropriate? Yes; Fluid consistency: Thin  Skin:  Reviewed, no issues  Last BM:  1/22  Height:   Ht Readings from Last 1 Encounters:  05/15/16 5\' 6"  (1.676 m)    Weight:   Wt Readings from Last 1 Encounters:  05/17/16 118 lb 6.4 oz (53.7 kg)    Ideal Body Weight:  59.09 kg  BMI:  Body mass index is 19.11 kg/m. Pt is underweight based on admission weight of 112 lbs= BMI of 18.2 kg/(m^2)  Estimated Nutritional Needs:   Kcal:  1300-1500  Protein:  65-75 grams  Fluid:  1.5 L/day  EDUCATION NEEDS:   No education needs identified at this time  Dorothea Ogle RD, CSP, LDN Inpatient Clinical Dietitian Pager: [phone] After Hours Pager: [phone]

## 2016-05-17 NOTE — Progress Notes (Signed)
I visited the patient socially.  No active cardiac issues.  She has not had any temperature or elevated white count, would have a low threshold to start antibiotic therapy in view of mitral valve disease.  However she does not appear to be toxic.  I have simply reviewed her charts, and reassured the patient, I'll certainly be available if cardiac issues were to arise.  She requests help with sleep, I will put in orders for Ambien for p.r.n. Use.   Yates Decamp, MD 05/17/2016, 7:10 PM [facility]. [facility] Pager: [phone] Office: [phone] If no answer: Cell:  [phone]

## 2016-05-18 ENCOUNTER — Inpatient Hospital Stay (HOSPITAL_COMMUNITY): Payer: PPO

## 2016-05-18 LAB — BASIC METABOLIC PANEL
Anion gap: 8 (ref 5–15)
BUN: <5 mg/dL — ABNORMAL LOW (ref 6–20)
CHLORIDE: 103 mmol/L (ref 101–111)
CO2: 24 mmol/L (ref 22–32)
Calcium: 9.2 mg/dL (ref 8.9–10.3)
Creatinine, Ser: 0.68 mg/dL (ref 0.44–1.00)
GFR calc non Af Amer: >60 mL/min (ref >60)
GLUCOSE: 142 mg/dL — AB (ref 65–99)
Potassium: 2.9 mmol/L — ABNORMAL LOW (ref 3.5–5.1)
Sodium: 135 mmol/L (ref 135–145)

## 2016-05-18 LAB — GLUCOSE, CAPILLARY: Glucose-Capillary: 133 mg/dL — ABNORMAL HIGH (ref 65–99)

## 2016-05-18 LAB — MAGNESIUM: Magnesium: 1.9 mg/dL (ref 1.7–2.4)

## 2016-05-18 MED ORDER — POTASSIUM CHLORIDE CRYS ER 20 MEQ PO TBCR
40.0000 meq | EXTENDED_RELEASE_TABLET | ORAL | Status: AC
  Administered 2016-05-18: 40 meq via ORAL
  Filled 2016-05-18: qty 2

## 2016-05-18 MED ORDER — TRAMADOL HCL 50 MG PO TABS
50.0000 mg | ORAL_TABLET | Freq: Four times a day (QID) | ORAL | Status: DC | PRN
  Administered 2016-05-18 – 2016-05-26 (×10): 50 mg via ORAL
  Filled 2016-05-18 (×11): qty 1

## 2016-05-18 MED ORDER — CIPROFLOXACIN IN D5W 400 MG/200ML IV SOLN
400.0000 mg | Freq: Two times a day (BID) | INTRAVENOUS | Status: DC
  Administered 2016-05-18 – 2016-05-21 (×6): 400 mg via INTRAVENOUS
  Filled 2016-05-18 (×6): qty 200

## 2016-05-18 MED ORDER — DIATRIZOATE MEGLUMINE & SODIUM 66-10 % PO SOLN: 90.0000 mL | Freq: Once | ORAL | Status: DC

## 2016-05-18 MED ORDER — POTASSIUM CHLORIDE CRYS ER 20 MEQ PO TBCR: 40.0000 meq | EXTENDED_RELEASE_TABLET | Freq: Once | ORAL | Status: DC

## 2016-05-18 MED ORDER — METRONIDAZOLE 500 MG PO TABS
500.0000 mg | ORAL_TABLET | Freq: Three times a day (TID) | ORAL | Status: DC
  Administered 2016-05-18 – 2016-05-19 (×4): 500 mg via ORAL
  Filled 2016-05-18 (×5): qty 1

## 2016-05-18 MED ORDER — SODIUM CHLORIDE 0.9 % IV SOLN
30.0000 meq | Freq: Once | INTRAVENOUS | Status: AC
  Administered 2016-05-18: 30 meq via INTRAVENOUS
  Filled 2016-05-18: qty 15

## 2016-05-18 MED ORDER — POTASSIUM CHLORIDE 20 MEQ PO PACK
40.0000 meq | PACK | Freq: Once | ORAL | Status: AC
  Administered 2016-05-18: 40 meq via ORAL
  Filled 2016-05-18: qty 2

## 2016-05-18 MED ORDER — DIATRIZOATE MEGLUMINE & SODIUM 66-10 % PO SOLN
90.0000 mL | Freq: Once | ORAL | Status: AC
  Administered 2016-05-19: 90 mL via NASOGASTRIC
  Filled 2016-05-18: qty 90

## 2016-05-18 MED ORDER — CIPROFLOXACIN HCL 500 MG PO TABS
500.0000 mg | ORAL_TABLET | Freq: Two times a day (BID) | ORAL | Status: DC
  Administered 2016-05-18: 500 mg via ORAL
  Filled 2016-05-18: qty 1

## 2016-05-18 NOTE — Progress Notes (Addendum)
Pt's son called for an update. This nurse inquired of his location/distance from the hospital, as the pt is vocalizing loneliness. He states that he is unable to come see her due to working 0730-1730 20 minutes away.

## 2016-05-18 NOTE — Progress Notes (Signed)
Page sent to Dr Sunnie Nielsen regarding pt's intermittent tolerance of PO intake, no BM and refusal to take Gastroview.   "pt 'cannot take' PO K+ in any form. tolerates tramadol well. pt denies any chance of taking Gastroview for CT, surgery's new order. Please advise."  Radiology aware of hold up.  Spoke with Carter @ [phone]  Dr Sunnie Nielsen changed K+ Orders to Potassium.

## 2016-05-18 NOTE — Progress Notes (Signed)
Page sent to Surgical PA Mattie Marlin regarding orders that were just entered.

## 2016-05-18 NOTE — Progress Notes (Signed)
PROGRESS NOTE    [name]  YME:[id number] DOB: 1940/12/03 DOA: 05/15/2016 PCP: Minda Meo, MD   Brief Narrative:  [name] [name] is a 76 y.o. female with medical history significant for hypertension, coronary artery disease, GERD, hyperthyroidism, and mitral valve prolapse status post MVR who presents to the emergency department with one-day vomiting and palpitations. Patient reports that she had been in her usual state of health until shortly after breakfast when she developed acute nausea with nonbloody nonbilious vomiting.   A CT of the abdomen and pelvis was obtained and demonstrates a small bowel obstruction with transition point in the mid abdomen, with findings suggestive of an internal hernia that site.   Admitted with SBO, surgery consulted and helping with management.    Assessment & Plan:   Principal Problem:   SBO (small bowel obstruction) Active Problems:   Hypertension   Anxiety   S/P mitral valve repair   Status post Maze operation for atrial fibrillation   Intractable vomiting   Heart palpitations   Bigeminy   Malnutrition of moderate degree  1-SBO;  CT abd/pelvis demonstrates SBO with findings suggestive of internal hernia. Transition point.  Continue with IV fluids. Surgery consulted and following. . She had 2  Bowel movement 1-22. She was started on clear diet. KUB still with SBO pattern.  Will start on IV antibiotics due to history of MVR.  Still with abdominal pain, passing gas, complaints of nausea.  KUB with persistent SBO. Surgery planning SBO protocol.   2-Palpitation, Frequents PVC.  Improved after IV fluids..  Replete electrolytes.  Resume coreg.   3-MVR, 2014 ECHO ; 2017: Mitral valve: An annular ring prosthesis was present. Mobility  was restricted. Transvalvular velocity was increased, due to stenosis. The findings are consistent with moderate stenosis.  HTN; resume coreg.   Anxiety;ativan PRN for anxiety.    Hypomagnesemia; replaced.     DVT prophylaxis: lovenox Code Status: Full code.  Family Communication: Care discussed with patient  Disposition Plan: remain inpatient.    Consultants:   Surgery  cardio   Procedures: none   Antimicrobials: none   Subjective: Anxious. Report nausea, abdominal pain, passing gas.   Objective: Vitals:   05/17/16 1600 05/17/16 2117 05/18/16 0554 05/18/16 0937  BP: (!) 146/83 (!) 156/73 (!) 150/62 (!) 143/81  Pulse: 60 82 80 75  Resp:  20 20   Temp:  97.8 F (36.6 C) 98.1 F (36.7 C) 98.5 F (36.9 C)  TempSrc:  Oral Oral Oral  SpO2:  100% 97% 98%  Weight:   51.6 kg (113 lb 12.8 oz)   Height:        Intake/Output Summary (Last 24 hours) at 05/18/16 0938 Last data filed at 05/18/16 0700  Gross per 24 hour  Intake              580 ml  Output              875 ml  Net             -295 ml   Filed Weights   05/16/16 0506 05/17/16 0544 05/18/16 0554  Weight: 51.1 kg (112 lb 9.6 oz) 53.7 kg (118 lb 6.4 oz) 51.6 kg (113 lb 12.8 oz)    Examination:  General exam: Appears calm and comfortable  Respiratory system: Clear to auscultation. Respiratory effort normal. Cardiovascular system: S1 & S2 heard, RRR. No JVD, murmurs, rubs, gallops or clicks. No pedal edema. Gastrointestinal system: Abdomen is nondistended, soft and  tender left side.  No organomegaly or masses felt. Normal bowel sounds heard. Central nervous system: Alert and oriented. No focal neurological deficits. Extremities: Symmetric 5 x 5 power. Skin: No rashes, lesions or ulcers    Data Reviewed: I have personally reviewed following labs and imaging studies  CBC:  Recent Labs Lab 05/15/16 1713 05/17/16 0459  WBC 10.2 10.1  NEUTROABS 7.8*  --   HGB 12.8 12.1  HCT 38.6 37.2  MCV 88.3 87.9  PLT 209 222   Basic Metabolic Panel:  Recent Labs Lab 05/15/16 1713 05/16/16 0711 05/17/16 0459 05/18/16 0342  NA 137 137 139 135  K 3.8 4.4 3.5 2.9*  CL 103 107  108 103  CO2 25 25 22 24   GLUCOSE 99 92 159* 142*  BUN 11 9 7  <5*  CREATININE 0.76 0.80 0.70 0.68  CALCIUM 10.1 9.3 9.0 9.2  MG 1.8  --  1.6* 1.9   GFR: Estimated Creatinine Clearance: 50.3 mL/min (by C-G formula based on SCr of 0.68 mg/dL). Liver Function Tests:  Recent Labs Lab 05/15/16 1713  AST 24  ALT 21  ALKPHOS 72  BILITOT 0.7  PROT 6.6  ALBUMIN 4.0    Recent Labs Lab 05/15/16 1713  LIPASE 24   No results for input(s): AMMONIA in the last 168 hours. Coagulation Profile: No results for input(s): INR, PROTIME in the last 168 hours. Cardiac Enzymes:  Recent Labs Lab 05/16/16 0139 05/16/16 0711  TROPONINI <0.03 <0.03   BNP (last 3 results) No results for input(s): PROBNP in the last 8760 hours. HbA1C: No results for input(s): HGBA1C in the last 72 hours. CBG:  Recent Labs Lab 05/16/16 0647 05/17/16 0558 05/18/16 0636  GLUCAP 87 157* 133*   Lipid Profile: No results for input(s): CHOL, HDL, LDLCALC, TRIG, CHOLHDL, LDLDIRECT in the last 72 hours. Thyroid Function Tests:  Recent Labs  05/15/16 1818  TSH 2.449   Anemia Panel: No results for input(s): VITAMINB12, FOLATE, FERRITIN, TIBC, IRON, RETICCTPCT in the last 72 hours. Sepsis Labs:  Recent Labs Lab 05/15/16 1728  LATICACIDVEN 0.68    No results found for this or any previous visit (from the past 240 hour(s)).       Radiology Studies: Dg Abd 1 View  Result Date: 05/17/2016 CLINICAL DATA:  76 year old female with right lower quadrant pain, nausea, vomiting and constipation. History of diverticulosis. Subsequent encounter. EXAM: ABDOMEN - 1 VIEW COMPARISON:  05/16/2016 plain film exam.  05/15/2016 CT. FINDINGS: Nasogastric tube has been removed. Progressive increase in number of gas distended small bowel loops with slightly thickened folds consistent with small bowel obstruction. Stool within the right colon. The possibility of free intraperitoneal air cannot be assessed on a supine  view. Mild scoliosis lumbar spine convex right possibly related to splinting. IMPRESSION: Nasogastric tube has been removed. Progressive increase in number of gas distended small bowel loops with slightly thickened folds consistent with small bowel obstruction. Electronically Signed   By: Lacy Duverney M.D.   On: 05/17/2016 09:35   Dg Abd Portable 1v  Result Date: 05/16/2016 CLINICAL DATA:  Small bowel obstruction.  Evaluate NG tube. EXAM: PORTABLE ABDOMEN - 1 VIEW COMPARISON:  CT scan from yesterday FINDINGS: An NG tube terminates in the stomach. A mildly prominent air-filled loop of small bowel in the left pelvis measures 3.2 cm. The bowel gas pattern is otherwise unremarkable. Contrast is seen the bladder consistent with yesterday's CT scan. Oval high attenuation in the right upper abdomen is probably  in the right renal collecting system. No other acute abnormalities. IMPRESSION: Appropriate placement of NG tube.  Improving bowel gas pattern. Electronically Signed   By: Gerome Sam III M.D   On: 05/16/2016 10:15        Scheduled Meds: . carvedilol  3.125 mg Oral BID WC  . ciprofloxacin  500 mg Oral BID  . enoxaparin (LOVENOX) injection  40 mg Subcutaneous Q24H  . famotidine (PEPCID) IV  20 mg Intravenous Q12H  . feeding supplement  1 Container Oral BID BM  . mouth rinse  15 mL Mouth Rinse BID  . metroNIDAZOLE  500 mg Oral Q8H  . pantoprazole (PROTONIX) IV  40 mg Intravenous Q12H  . potassium chloride  40 mEq Oral Once  . potassium chloride  40 mEq Oral Q4H  . sodium chloride flush  3 mL Intravenous Q12H   Continuous Infusions: . dextrose 5 % and 0.9% NaCl 100 mL/hr at 05/18/16 0900     LOS: 2 days    Time spent: 35 minutes.     Alba Cory, MD [facility] Pager [phone]  If 7PM-7AM, please contact night-coverage [email or web address] Password [facility] 05/18/2016, 9:38 AM

## 2016-05-18 NOTE — Progress Notes (Signed)
Contacted xray, requested they re-send transport for pt to have ABD xray completed. After pt driven postponement this morning.

## 2016-05-18 NOTE — Progress Notes (Signed)
Subjective: Pt states she is passing flatus and has had BMs Feels less bloated  Objective: Vital signs in last 24 hours: Temp:  [97.8 F (36.6 C)-99.2 F (37.3 C)] 98.1 F (36.7 C) (01/23 0554) Pulse Rate:  [41-82] 80 (01/23 0554) Resp:  [18-20] 20 (01/23 0554) BP: (146-156)/(62-83) 150/62 (01/23 0554) SpO2:  [97 %-100 %] 97 % (01/23 0554) Weight:  [51.6 kg (113 lb 12.8 oz)] 51.6 kg (113 lb 12.8 oz) (01/23 0554) Last BM Date: 05/17/16  Intake/Output from previous day: 01/22 0701 - 01/23 0700 In: 580 [P.O.:580] Out: 1175 [Urine:1175] Intake/Output this shift: No intake/output data recorded.  General appearance: alert and cooperative GI: soft. min ttp, min dist, no rebound/guarding  Lab Results:   Recent Labs  05/15/16 1713 05/17/16 0459  WBC 10.2 10.1  HGB 12.8 12.1  HCT 38.6 37.2  PLT 209 222   BMET  Recent Labs  05/17/16 0459 05/18/16 0342  NA 139 135  K 3.5 2.9*  CL 108 103  CO2 22 24  GLUCOSE 159* 142*  BUN 7 <5*  CREATININE 0.70 0.68  CALCIUM 9.0 9.2   PT/INR No results for input(s): LABPROT, INR in the last 72 hours. ABG No results for input(s): PHART, HCO3 in the last 72 hours.  Invalid input(s): PCO2, PO2  Studies/Results: Dg Abd 1 View  Result Date: 05/17/2016 CLINICAL DATA:  76 year old female with right lower quadrant pain, nausea, vomiting and constipation. History of diverticulosis. Subsequent encounter. EXAM: ABDOMEN - 1 VIEW COMPARISON:  05/16/2016 plain film exam.  05/15/2016 CT. FINDINGS: Nasogastric tube has been removed. Progressive increase in number of gas distended small bowel loops with slightly thickened folds consistent with small bowel obstruction. Stool within the right colon. The possibility of free intraperitoneal air cannot be assessed on a supine view. Mild scoliosis lumbar spine convex right possibly related to splinting. IMPRESSION: Nasogastric tube has been removed. Progressive increase in number of gas distended  small bowel loops with slightly thickened folds consistent with small bowel obstruction. Electronically Signed   By: Lacy Duverney M.D.   On: 05/17/2016 09:35   Dg Abd Portable 1v  Result Date: 05/16/2016 CLINICAL DATA:  Small bowel obstruction.  Evaluate NG tube. EXAM: PORTABLE ABDOMEN - 1 VIEW COMPARISON:  CT scan from yesterday FINDINGS: An NG tube terminates in the stomach. A mildly prominent air-filled loop of small bowel in the left pelvis measures 3.2 cm. The bowel gas pattern is otherwise unremarkable. Contrast is seen the bladder consistent with yesterday's CT scan. Oval high attenuation in the right upper abdomen is probably in the right renal collecting system. No other acute abnormalities. IMPRESSION: Appropriate placement of NG tube.  Improving bowel gas pattern. Electronically Signed   By: Gerome Sam III M.D   On: 05/16/2016 10:15    Anti-infectives: Anti-infectives    Start     Dose/Rate Route Frequency Ordered Stop   05/18/16 0800  ciprofloxacin (CIPRO) tablet 500 mg     500 mg Oral 2 times daily 05/18/16 0749     05/18/16 0800  metroNIDAZOLE (FLAGYL) tablet 500 mg     500 mg Oral Every 8 hours 05/18/16 0749        Assessment/Plan: SBO -repeat KUB if con't to have an obstructive pattern, will start SBO protocol  Plan:  KUB shows large right sided stool load.  Pt states she's had BMs.  Will repeat KUB.  Still having some nausea.  No surgical plans at this time   LOS:  2 days    Marigene Ehlers., Jed Limerick 05/18/2016

## 2016-05-19 ENCOUNTER — Inpatient Hospital Stay (HOSPITAL_COMMUNITY): Payer: PPO

## 2016-05-19 DIAGNOSIS — R112 Nausea with vomiting, unspecified: Secondary | ICD-10-CM

## 2016-05-19 DIAGNOSIS — R002 Palpitations: Secondary | ICD-10-CM

## 2016-05-19 DIAGNOSIS — I10 Essential (primary) hypertension: Secondary | ICD-10-CM

## 2016-05-19 DIAGNOSIS — K56609 Unspecified intestinal obstruction, unspecified as to partial versus complete obstruction: Secondary | ICD-10-CM

## 2016-05-19 DIAGNOSIS — I499 Cardiac arrhythmia, unspecified: Secondary | ICD-10-CM

## 2016-05-19 DIAGNOSIS — Z9889 Other specified postprocedural states: Secondary | ICD-10-CM

## 2016-05-19 DIAGNOSIS — F419 Anxiety disorder, unspecified: Secondary | ICD-10-CM

## 2016-05-19 LAB — CBC
HCT: 35.6 % — ABNORMAL LOW (ref 36.0–46.0)
Hemoglobin: 12 g/dL (ref 12.0–15.0)
MCH: 29.1 pg (ref 26.0–34.0)
MCHC: 33.7 g/dL (ref 30.0–36.0)
MCV: 86.2 fL (ref 78.0–100.0)
PLATELETS: 207 10*3/uL (ref 150–400)
RBC: 4.13 MIL/uL (ref 3.87–5.11)
RDW: 13.4 % (ref 11.5–15.5)
WBC: 8.7 10*3/uL (ref 4.0–10.5)

## 2016-05-19 LAB — BASIC METABOLIC PANEL
Anion gap: 8 (ref 5–15)
BUN: 7 mg/dL (ref 6–20)
CALCIUM: 9.5 mg/dL (ref 8.9–10.3)
CO2: 26 mmol/L (ref 22–32)
CREATININE: 0.62 mg/dL (ref 0.44–1.00)
Chloride: 101 mmol/L (ref 101–111)
GFR calc Af Amer: >60 mL/min (ref >60)
GLUCOSE: 172 mg/dL — AB (ref 65–99)
POTASSIUM: 3.1 mmol/L — AB (ref 3.5–5.1)
SODIUM: 135 mmol/L (ref 135–145)

## 2016-05-19 LAB — GLUCOSE, CAPILLARY: GLUCOSE-CAPILLARY: 170 mg/dL — AB (ref 65–99)

## 2016-05-19 MED ORDER — BISACODYL 10 MG RE SUPP
10.0000 mg | Freq: Once | RECTAL | Status: AC
Start: 2016-05-19 — End: 2016-05-19
  Administered 2016-05-19: 10 mg via RECTAL
  Filled 2016-05-19: qty 1

## 2016-05-19 MED ORDER — POTASSIUM CHLORIDE 2 MEQ/ML IV SOLN
30.0000 meq | Freq: Once | INTRAVENOUS | Status: AC
  Administered 2016-05-19: 30 meq via INTRAVENOUS
  Filled 2016-05-19: qty 15

## 2016-05-19 NOTE — Progress Notes (Signed)
Spoke with patient's son Dannielle Huh. He called wanting an update on patient's care. I spoke with Ms. Sveum to verify that it was alright to discuss her care with her son when he calls and  Patient says absolutely. Dannielle Huh will be called back and updated on his mother care.

## 2016-05-19 NOTE — Progress Notes (Signed)
Pt c/o abdomen pain.  Abdomen distended.  Pt is still not able to have a BM.  Suppository has been given.  PA notified.

## 2016-05-19 NOTE — Progress Notes (Signed)
PROGRESS NOTE    [name] [name]  ZOX:[id number] DOB: 21-Jan-1941 DOA: 05/15/2016 PCP: Minda Meo, MD   Chief Complaint  Patient presents with  . Palpitations    Brief Narrative:  HPI on 05/15/2016 by Dr. Marcial Pacas Opyd [name] [name] is a 76 y.o. female with medical history significant for hypertension, coronary artery disease, GERD, hyperthyroidism, and mitral valve prolapse status post MVR who presents to the emergency department with one-day vomiting and palpitations. Patient reports that she had been in her usual state of health until shortly after breakfast when she developed acute nausea with nonbloody nonbilious vomiting. These symptoms persisted throughout the morning and afternoon and she developed palpitations. She reports a history of anxiety and has been very concerned with the palpitations, prompting her presentation to the ED. She reports a normal bowel movement the day prior. She reports mid-abdominal pain, described as cramping, moderate in intensity, localized to the mid-abdomen, and without associated fevers, chills, or diarrhea. Patient had eaten organic eggs for breakfast and suspects that has causing her symptoms. She has not been able to tolerate anything by mouth since the vomiting developed. She tried taking the Protonix at home, but quickly vomited and then felt as though it made things worse. She denies similar symptoms previously.   A CT of the abdomen and pelvis was obtained and demonstrates a small bowel obstruction with transition point in the mid abdomen, with findings suggestive of an internal hernia that site. Admitted with SBO, surgery consulted and helping with management.   Assessment & Plan   Small bowel obstruction -CT abd/pelvis demonstrates SBO with findings suggestive of internal hernia. Transition point.  -Continue with IV fluids. -General surgery consulted and appreciated -KUB showed SBO pattern, stool burden -Gastrograffin ordered    -Continue cipro/flagyl -patient did have NGT placed, but pulled out and refused replacement  Palpitations/ Frequents PVC.  -Improved after IV fluids. -Magnesium replaced -Continue coreg  MVR, 2014 -Echocardiogram in 2017: Mitral valve: An annular ring prosthesis was present. Mobility was restricted. Transvalvular velocity was increased, due to stenosis. The findings are consistent with moderate stenosis. -Dr. Jacinto Halim aware of patient's admission.   Essential hypertension -Continue coreg   Anxiety -Continue ativan PRN    Hypomagnesemia -Replaced  DVT Prophylaxis  lovenox  Code Status: Full  Family Communication: None at bedside  Disposition Plan: Admitted. Continue to monitor. Given gastrografin today as patient had large stool load.   Consultants Cardiology General surgery  Procedures  None  Antibiotics   Anti-infectives    Start     Dose/Rate Route Frequency Ordered Stop   05/18/16 1730  ciprofloxacin (CIPRO) IVPB 400 mg     400 mg 200 mL/hr over 60 Minutes Intravenous Every 12 hours 05/18/16 1721     05/18/16 0800  ciprofloxacin (CIPRO) tablet 500 mg  Status:  Discontinued     500 mg Oral 2 times daily 05/18/16 0749 05/18/16 1721   05/18/16 0800  metroNIDAZOLE (FLAGYL) tablet 500 mg     500 mg Oral Every 8 hours 05/18/16 0749        Subjective:   [name] seen and examined today.  Patient would like water.  Denies current nausea. Denies bowel movement or gas passage since admission. Denies chest pain, shortness of breath, dizziness, headache.  Objective:   Vitals:   05/18/16 1615 05/18/16 2317 05/19/16 0334 05/19/16 0759  BP: (!) 159/81 (!) 158/94 (!) 148/86 (!) 152/76  Pulse: 85 85 88 (!) 44  Resp:  20 20 20  Temp:  98.4 F (36.9 C) 98.2 F (36.8 C) 98.2 F (36.8 C)  TempSrc:  Oral Oral Oral  SpO2:  98% 99% 100%  Weight:   51.5 kg (113 lb 9.6 oz)   Height:        Intake/Output Summary (Last 24 hours) at 05/19/16 1419 Last data  filed at 05/19/16 1347  Gross per 24 hour  Intake              340 ml  Output              800 ml  Net             -460 ml   Filed Weights   05/17/16 0544 05/18/16 0554 05/19/16 0334  Weight: 53.7 kg (118 lb 6.4 oz) 51.6 kg (113 lb 12.8 oz) 51.5 kg (113 lb 9.6 oz)    Exam  General: Well developed, well nourished, NAD, appears stated age  HEENT: NCAT, mucous membranes moist.   Cardiovascular: S1 S2 auscultated, no rubs, murmurs or gallops. Regular rate and rhythm.  Respiratory: Clear to auscultation bilaterally with equal chest rise  Abdomen: Soft, generalized TTP, nondistended, + bowel sounds  Extremities: warm dry without cyanosis clubbing or edema  Neuro: AAOx3, nonfocal  Psych: Normal affect and demeanor    Data Reviewed: I have personally reviewed following labs and imaging studies  CBC:  Recent Labs Lab 05/15/16 1713 05/17/16 0459 05/19/16 0528  WBC 10.2 10.1 8.7  NEUTROABS 7.8*  --   --   HGB 12.8 12.1 12.0  HCT 38.6 37.2 35.6*  MCV 88.3 87.9 86.2  PLT 209 222 207   Basic Metabolic Panel:  Recent Labs Lab 05/15/16 1713 05/16/16 0711 05/17/16 0459 05/18/16 0342 05/19/16 0528  NA 137 137 139 135 135  K 3.8 4.4 3.5 2.9* 3.1*  CL 103 107 108 103 101  CO2 25 25 22 24 26   GLUCOSE 99 92 159* 142* 172*  BUN 11 9 7  <5* 7  CREATININE 0.76 0.80 0.70 0.68 0.62  CALCIUM 10.1 9.3 9.0 9.2 9.5  MG 1.8  --  1.6* 1.9  --    GFR: Estimated Creatinine Clearance: 50.2 mL/min (by C-G formula based on SCr of 0.62 mg/dL). Liver Function Tests:  Recent Labs Lab 05/15/16 1713  AST 24  ALT 21  ALKPHOS 72  BILITOT 0.7  PROT 6.6  ALBUMIN 4.0    Recent Labs Lab 05/15/16 1713  LIPASE 24   No results for input(s): AMMONIA in the last 168 hours. Coagulation Profile: No results for input(s): INR, PROTIME in the last 168 hours. Cardiac Enzymes:  Recent Labs Lab 05/16/16 0139 05/16/16 0711  TROPONINI <0.03 <0.03   BNP (last 3 results) No results for  input(s): PROBNP in the last 8760 hours. HbA1C: No results for input(s): HGBA1C in the last 72 hours. CBG:  Recent Labs Lab 05/16/16 0647 05/17/16 0558 05/18/16 0636 05/19/16 0608  GLUCAP 87 157* 133* 170*   Lipid Profile: No results for input(s): CHOL, HDL, LDLCALC, TRIG, CHOLHDL, LDLDIRECT in the last 72 hours. Thyroid Function Tests: No results for input(s): TSH, T4TOTAL, FREET4, T3FREE, THYROIDAB in the last 72 hours. Anemia Panel: No results for input(s): VITAMINB12, FOLATE, FERRITIN, TIBC, IRON, RETICCTPCT in the last 72 hours. Urine analysis:    Component Value Date/Time   COLORURINE YELLOW 05/15/2016 2146   APPEARANCEUR CLEAR 05/15/2016 2146   LABSPEC 1.015 05/15/2016 2146   PHURINE 7.0 05/15/2016 2146   GLUCOSEU NEGATIVE 05/15/2016 2146  HGBUR MODERATE (A) 05/15/2016 2146   BILIRUBINUR NEGATIVE 05/15/2016 2146   BILIRUBINUR negative 11/02/2014 1336   KETONESUR 15 (A) 05/15/2016 2146   PROTEINUR NEGATIVE 05/15/2016 2146   UROBILINOGEN 0.2 11/02/2014 1336   UROBILINOGEN 0.2 02/10/2013 1615   NITRITE NEGATIVE 05/15/2016 2146   LEUKOCYTESUR NEGATIVE 05/15/2016 2146   Sepsis Labs: @LABRCNTIP (procalcitonin:4,lacticidven:4)  )No results found for this or any previous visit (from the past 240 hour(s)).    Radiology Studies: Dg Abd 2 Views  Result Date: 05/18/2016 CLINICAL DATA:  76 year old female with small bowel obstruction. Subsequent encounter. EXAM: ABDOMEN - 2 VIEW COMPARISON:  05/17/2016 FINDINGS: Persistent gas distended small bowel loops with thickened folds consistent with small bowel obstruction without improvement. No free air. Stool right colon. IMPRESSION: Persistent high-grade small bowel obstruction. Electronically Signed   By: Lacy Duverney M.D.   On: 05/18/2016 13:23     Scheduled Meds: . carvedilol  3.125 mg Oral BID WC  . ciprofloxacin  400 mg Intravenous Q12H  . enoxaparin (LOVENOX) injection  40 mg Subcutaneous Q24H  . famotidine (PEPCID)  IV  20 mg Intravenous Q12H  . feeding supplement  1 Container Oral BID BM  . mouth rinse  15 mL Mouth Rinse BID  . metroNIDAZOLE  500 mg Oral Q8H  . pantoprazole (PROTONIX) IV  40 mg Intravenous Q12H  . sodium chloride flush  3 mL Intravenous Q12H   Continuous Infusions: . dextrose 5 % and 0.9% NaCl 100 mL/hr at 05/18/16 0900     LOS: 3 days   Time Spent in minutes   30 minutes  Angelena Sand D.O. on 05/19/2016 at 2:19 PM  Between 7am to 7pm - Pager - [phone]  After 7pm go to [email or web address] - password [email or web address]  And look for the night coverage person covering for me after hours  [facility] Office  [phone]

## 2016-05-19 NOTE — Progress Notes (Signed)
Pt states she does not want another NG tube.  Pt states, "I am too weak and my heart is not strong enough".  MD paged.

## 2016-05-19 NOTE — Consult Note (Signed)
   [facility] CM Inpatient Consult   05/19/2016  MYSTIC LABO [birth year] [id number]   Patient evaluated for [facility] needs and was assigned EMMI HF calls by inpatient RNCM. Chart review reveals the patient is [name] [name] is a 76 y.o. female with medical history significant for hypertension, coronary artery disease, GERD, hyperthyroidism, and mitral valve prolapse status post MVR who presents to the emergency department with one-day vomiting and palpitations. Patient with HX of HF.  On the day of admission, the patient reports that she had been in her usual state of health until shortly after breakfast when she developed acute nausea with nonbloody nonbilious vomiting.  Met with the patient  who endorses [employer] plan and her primary care provider to be Dr. Burnard Bunting.  Patient will receive post hospital EMMI automated calls, if she has symptoms a [facility] telephonic nurse will respond as needed and she states she would be fine with that.  Encouraged her to call the 24 nurse line as needed.  Patient with ongoing GI problems she states. A brochure with [facility] given with contact information.  Patient verbalized understanding.  For questions, please contact:  Natividad Brood, RN BSN [facility] Liaison  [phone] business mobile phone Toll free office [phone]

## 2016-05-19 NOTE — Progress Notes (Signed)
[facility] Progress Note     Subjective: Pt states she is too weak to have an NG tube. She is not passing much gas and has not had a BM since admission. She is having mild nausea, one episode of emesis yesterday. She is tolerating her clear liquids although she is not taking in much. No fever or chills overnight.   Objective: Vital signs in last 24 hours: Temp:  [98 F (36.7 C)-98.5 F (36.9 C)] 98.2 F (36.8 C) (01/24 0759) Pulse Rate:  [44-88] 44 (01/24 0759) Resp:  [18-20] 20 (01/24 0759) BP: (143-159)/(76-94) 152/76 (01/24 0759) SpO2:  [98 %-100 %] 100 % (01/24 0759) Weight:  [113 lb 9.6 oz (51.5 kg)] 113 lb 9.6 oz (51.5 kg) (01/24 0334) Last BM Date: 05/17/16  Intake/Output from previous day: 01/23 0701 - 01/24 0700 In: 680 [P.O.:680] Out: 1150 [Urine:1150] Intake/Output this shift: No intake/output data recorded.  PE: Constitutional: She is oriented to person, place, and timeand well-developed, well-nourished, and in no distress. Appears anxious   Neck: Normal range of motion. Neck supple.  Cardiovascular: Normal rate, regular rhythm, normal heart sounds. No murmurheard. Pulmonary/Chest: Effort normal No respiratory distress.  Abdominal: Soft. Bowel sounds are normal. She exhibits no distension. There is no reboundor guarding.  Very mild generalized TTP Skin: Skin is warmand dry. She is not diaphoretic.  Psychiatric: Moodand affectnormal.  Nursing noteand vitalsreviewed.  Lab Results:   Recent Labs  05/17/16 0459 05/19/16 0528  WBC 10.1 8.7  HGB 12.1 12.0  HCT 37.2 35.6*  PLT 222 207   BMET  Recent Labs  05/18/16 0342 05/19/16 0528  NA 135 135  K 2.9* 3.1*  CL 103 101  CO2 24 26  GLUCOSE 142* 172*  BUN <5* 7  CREATININE 0.68 0.62  CALCIUM 9.2 9.5   PT/INR No results for input(s): LABPROT, INR in the last 72 hours. CMP     Component Value Date/Time   NA 135 05/19/2016 0528   K 3.1 (L) 05/19/2016 0528   CL 101  05/19/2016 0528   CO2 26 05/19/2016 0528   GLUCOSE 172 (H) 05/19/2016 0528   BUN 7 05/19/2016 0528   CREATININE 0.62 05/19/2016 0528   CREATININE 0.73 11/02/2014 1316   CALCIUM 9.5 05/19/2016 0528   PROT 6.6 05/15/2016 1713   ALBUMIN 4.0 05/15/2016 1713   AST 24 05/15/2016 1713   ALT 21 05/15/2016 1713   ALKPHOS 72 05/15/2016 1713   BILITOT 0.7 05/15/2016 1713   GFRNONAA >60 05/19/2016 0528   GFRNONAA 83 09/22/2014 1615   GFRAA >60 05/19/2016 0528   GFRAA >89 09/22/2014 1615   Lipase     Component Value Date/Time   LIPASE 24 05/15/2016 1713       Studies/Results: Dg Abd 1 View  Result Date: 05/17/2016 CLINICAL DATA:  76 year old female with right lower quadrant pain, nausea, vomiting and constipation. History of diverticulosis. Subsequent encounter. EXAM: ABDOMEN - 1 VIEW COMPARISON:  05/16/2016 plain film exam.  05/15/2016 CT. FINDINGS: Nasogastric tube has been removed. Progressive increase in number of gas distended small bowel loops with slightly thickened folds consistent with small bowel obstruction. Stool within the right colon. The possibility of free intraperitoneal air cannot be assessed on a supine view. Mild scoliosis lumbar spine convex right possibly related to splinting. IMPRESSION: Nasogastric tube has been removed. Progressive increase in number of gas distended small bowel loops with slightly thickened folds consistent with small bowel obstruction. Electronically Signed   By: Viviann Spare  Constance Goltz M.D.   On: 05/17/2016 09:35   Dg Abd 2 Views  Result Date: 05/18/2016 CLINICAL DATA:  76 year old female with small bowel obstruction. Subsequent encounter. EXAM: ABDOMEN - 2 VIEW COMPARISON:  05/17/2016 FINDINGS: Persistent gas distended small bowel loops with thickened folds consistent with small bowel obstruction without improvement. No free air. Stool right colon. IMPRESSION: Persistent high-grade small bowel obstruction. Electronically Signed   By: Lacy Duverney M.D.   On:  05/18/2016 13:23    Anti-infectives: Anti-infectives    Start     Dose/Rate Route Frequency Ordered Stop   05/18/16 1730  ciprofloxacin (CIPRO) IVPB 400 mg     400 mg 200 mL/hr over 60 Minutes Intravenous Every 12 hours 05/18/16 1721     05/18/16 0800  ciprofloxacin (CIPRO) tablet 500 mg  Status:  Discontinued     500 mg Oral 2 times daily 05/18/16 0749 05/18/16 1721   05/18/16 0800  metroNIDAZOLE (FLAGYL) tablet 500 mg     500 mg Oral Every 8 hours 05/18/16 0749         Assessment/Plan  SBO - CT scan: Small bowel obstruction with transition point in the left mid abdomen. At the point of transition there are findings suggesting internal hernia of small bowel into the left posterior upper quadrant. - NGT had little to no output when placed, pt pulled it out and refused another one - SBO protocol  Plan: Not sure pt is truly obstructed. This could be 2/2 heavy stool load. KUB shows large right sided stool load. Will repeat KUB 8h after gastrografin. Suppository for stool load. Still having some nausea and stated the K made her vomit yesterday.  No surgical plans at this time    LOS: 3 days    Jerre Simon , [facility] 05/19/2016, 9:21 AM Pager: [phone] Consults: [phone] Mon-Fri 7:00 am-4:30 pm Sat-Sun 7:00 am-11:30 am

## 2016-05-19 NOTE — Care Management Important Message (Signed)
Important Message  Patient Details  Name: [name] [name] MRN: [id number] Date of Birth: 05-18-1940   Medicare Important Message Given:  Yes    [name] [name] 05/19/2016, 12:05 PM

## 2016-05-20 ENCOUNTER — Inpatient Hospital Stay (HOSPITAL_COMMUNITY): Payer: PPO

## 2016-05-20 DIAGNOSIS — Z8679 Personal history of other diseases of the circulatory system: Secondary | ICD-10-CM

## 2016-05-20 LAB — MAGNESIUM: Magnesium: 1.7 mg/dL (ref 1.7–2.4)

## 2016-05-20 LAB — CBC
HEMATOCRIT: 37.9 % (ref 36.0–46.0)
HEMOGLOBIN: 12.8 g/dL (ref 12.0–15.0)
MCH: 29.4 pg (ref 26.0–34.0)
MCHC: 33.8 g/dL (ref 30.0–36.0)
MCV: 86.9 fL (ref 78.0–100.0)
Platelets: 239 10*3/uL (ref 150–400)
RBC: 4.36 MIL/uL (ref 3.87–5.11)
RDW: 13.5 % (ref 11.5–15.5)
WBC: 11 10*3/uL — AB (ref 4.0–10.5)

## 2016-05-20 LAB — BASIC METABOLIC PANEL
ANION GAP: 9 (ref 5–15)
BUN: 11 mg/dL (ref 6–20)
CALCIUM: 9.8 mg/dL (ref 8.9–10.3)
CO2: 27 mmol/L (ref 22–32)
Chloride: 101 mmol/L (ref 101–111)
Creatinine, Ser: 0.66 mg/dL (ref 0.44–1.00)
GFR calc non Af Amer: >60 mL/min (ref >60)
Glucose, Bld: 163 mg/dL — ABNORMAL HIGH (ref 65–99)
Potassium: 3 mmol/L — ABNORMAL LOW (ref 3.5–5.1)
Sodium: 137 mmol/L (ref 135–145)

## 2016-05-20 LAB — GLUCOSE, CAPILLARY: GLUCOSE-CAPILLARY: 147 mg/dL — AB (ref 65–99)

## 2016-05-20 MED ORDER — MAGNESIUM SULFATE 2 GM/50ML IV SOLN
2.0000 g | Freq: Once | INTRAVENOUS | Status: AC
Start: 2016-05-20 — End: 2016-05-20
  Administered 2016-05-20: 2 g via INTRAVENOUS
  Filled 2016-05-20: qty 50

## 2016-05-20 MED ORDER — SODIUM CHLORIDE 0.9 % IV SOLN
30.0000 meq | Freq: Once | INTRAVENOUS | Status: DC
  Filled 2016-05-20: qty 15

## 2016-05-20 MED ORDER — METRONIDAZOLE IN NACL 5-0.79 MG/ML-% IV SOLN
500.0000 mg | Freq: Three times a day (TID) | INTRAVENOUS | Status: DC
  Administered 2016-05-20 – 2016-05-26 (×17): 500 mg via INTRAVENOUS
  Filled 2016-05-20 (×17): qty 100

## 2016-05-20 MED ORDER — SODIUM CHLORIDE 0.9 % IV SOLN
30.0000 meq | Freq: Once | INTRAVENOUS | Status: AC
  Administered 2016-05-20: 30 meq via INTRAVENOUS
  Filled 2016-05-20: qty 15

## 2016-05-20 NOTE — Progress Notes (Signed)
Pt with low NG tube output, connections checked.  Wall suction not working properly.  Turned to full suction and 920 ml pulled out.  Changed wall suction and turned back to low  Intermittent suction.  Device working properly now.  Will continue to monitor.

## 2016-05-20 NOTE — Progress Notes (Signed)
[facility] Progress Note     Subjective: Pt complaining of back and neck pain. She states she wishes she would die because she feels awful. She is not having abdominal pain. She did have a small amt of brown emesis this morning and the nurse states she did vomit overnight. Nurse states pt had a very small BM yesterday after the suppository.   Objective: Vital signs in last 24 hours: Temp:  [98.4 F (36.9 C)-98.8 F (37.1 C)] 98.4 F (36.9 C) (01/25 0349) Pulse Rate:  [60-94] 60 (01/25 0349) Resp:  [18-20] 18 (01/25 0349) BP: (157-166)/(74-82) 157/82 (01/25 0349) SpO2:  [99 %-100 %] 100 % (01/25 0349) Weight:  [113 lb 14.4 oz (51.7 kg)] 113 lb 14.4 oz (51.7 kg) (01/25 0349) Last BM Date: 05/19/16  Intake/Output from previous day: 01/24 0701 - 01/25 0700 In: 1426.7 [P.O.:120; I.V.:1056.7; IV Piggyback:250] Out: 1151 [Urine:1150; Stool:1] Intake/Output this shift: No intake/output data recorded.  PE: Constitutional: She is oriented to person, place, and timeand well-developed, well-nourished, and in no distress. Appears irritated  Neck: Normal range of motion. Neck supple.  Cardiovascular: Normal rate, regular rhythm, normal heart sounds. No murmurheard. Pulmonary/Chest: Effort normalNo respiratory distress.  Abdominal: Soft. Bowel sounds are normal. She exhibits mild distension. There is no reboundor guarding.  Very mild generalized TTP Skin: Skin is warmand dry. She is not diaphoretic.  Psychiatric: Moodand affectnormal.  Nursing noteand vitalsreviewed.  Lab Results:   Recent Labs  05/19/16 0528 05/20/16 0413  WBC 8.7 11.0*  HGB 12.0 12.8  HCT 35.6* 37.9  PLT 207 239   BMET  Recent Labs  05/19/16 0528 05/20/16 0413  NA 135 137  K 3.1* 3.0*  CL 101 101  CO2 26 27  GLUCOSE 172* 163*  BUN 7 11  CREATININE 0.62 0.66  CALCIUM 9.5 9.8   PT/INR No results for input(s): LABPROT, INR in the last 72 hours. CMP     Component Value  Date/Time   NA 137 05/20/2016 0413   K 3.0 (L) 05/20/2016 0413   CL 101 05/20/2016 0413   CO2 27 05/20/2016 0413   GLUCOSE 163 (H) 05/20/2016 0413   BUN 11 05/20/2016 0413   CREATININE 0.66 05/20/2016 0413   CREATININE 0.73 11/02/2014 1316   CALCIUM 9.8 05/20/2016 0413   PROT 6.6 05/15/2016 1713   ALBUMIN 4.0 05/15/2016 1713   AST 24 05/15/2016 1713   ALT 21 05/15/2016 1713   ALKPHOS 72 05/15/2016 1713   BILITOT 0.7 05/15/2016 1713   GFRNONAA >60 05/20/2016 0413   GFRNONAA 83 09/22/2014 1615   GFRAA >60 05/20/2016 0413   GFRAA >89 09/22/2014 1615   Lipase     Component Value Date/Time   LIPASE 24 05/15/2016 1713       Studies/Results: Dg Abd 2 Views  Result Date: 05/18/2016 CLINICAL DATA:  76 year old female with small bowel obstruction. Subsequent encounter. EXAM: ABDOMEN - 2 VIEW COMPARISON:  05/17/2016 FINDINGS: Persistent gas distended small bowel loops with thickened folds consistent with small bowel obstruction without improvement. No free air. Stool right colon. IMPRESSION: Persistent high-grade small bowel obstruction. Electronically Signed   By: Lacy Duverney M.D.   On: 05/18/2016 13:23   Dg Abd Portable 1v-small Bowel Obstruction Protocol-initial, 8 Hr Delay  Result Date: 05/19/2016 CLINICAL DATA:  Small bowel obstruction. EXAM: PORTABLE ABDOMEN - 1 VIEW COMPARISON:  Earlier the same day. FINDINGS: AP supine portable study at 2031 hours shows contrast material in the stomach and proximal small bowel. Small  bowel loops remain dilated with gas filled with left abdominal loop measuring up to 5.4 cm diameter. No evidence for contrast material in the colon. IMPRESSION: Imaging features remain consistent with small bowel obstruction. Electronically Signed   By: Kennith Center M.D.   On: 05/19/2016 22:28    Anti-infectives: Anti-infectives    Start     Dose/Rate Route Frequency Ordered Stop   05/18/16 1730  ciprofloxacin (CIPRO) IVPB 400 mg     400 mg 200 mL/hr over 60  Minutes Intravenous Every 12 hours 05/18/16 1721     05/18/16 0800  ciprofloxacin (CIPRO) tablet 500 mg  Status:  Discontinued     500 mg Oral 2 times daily 05/18/16 0749 05/18/16 1721   05/18/16 0800  metroNIDAZOLE (FLAGYL) tablet 500 mg     500 mg Oral Every 8 hours 05/18/16 0749         Assessment/Plan  SBO - CT scan: Small bowel obstruction with transition point in the left mid abdomen. At the point of transition there are findings suggesting internal hernia of small bowel into the left posterior upper quadrant. - NGT had little to no output when placed, pt pulled it out and refused another one - SBO protocol - 8h abd film showed no contrast in the colon, will repeat tonight.  - enema to help with stool load  Plan: Not sure pt is truly obstructed. This could be 2/2 heavy stool load and slow motility. KUB shows large right sided stool load. Enema for stool load. Some emesis. Abd xray last evening showed no contrast in the colon. Per protocol will repeat abd xray at 2000 to see if there is contrast in the colon. If there is no contrast, pt will be taken to OR for ex lap for SBO. I discussed with the pt that if she continues to vomit she will need an NG tube. She was agreeable to this. Nurse was at bedside.    LOS: 4 days    Jerre Simon , [facility] Surgery 05/20/2016, 8:18 AM Pager: [phone] Consults: [phone] Mon-Fri 7:00 am-4:30 pm Sat-Sun 7:00 am-11:30 am

## 2016-05-20 NOTE — Progress Notes (Signed)
PROGRESS NOTE    [name] [name]  VGJ:[id number] DOB: November 30, 1940 DOA: 05/15/2016 PCP: Minda Meo, MD   Chief Complaint  Patient presents with  . Palpitations    Brief Narrative:  HPI on 05/15/2016 by Dr. Marcial Pacas Opyd [name] [name] is a 76 y.o. female with medical history significant for hypertension, coronary artery disease, GERD, hyperthyroidism, and mitral valve prolapse status post MVR who presents to the emergency department with one-day vomiting and palpitations. Patient reports that she had been in her usual state of health until shortly after breakfast when she developed acute nausea with nonbloody nonbilious vomiting. These symptoms persisted throughout the morning and afternoon and she developed palpitations. She reports a history of anxiety and has been very concerned with the palpitations, prompting her presentation to the ED. She reports a normal bowel movement the day prior. She reports mid-abdominal pain, described as cramping, moderate in intensity, localized to the mid-abdomen, and without associated fevers, chills, or diarrhea. Patient had eaten organic eggs for breakfast and suspects that has causing her symptoms. She has not been able to tolerate anything by mouth since the vomiting developed. She tried taking the Protonix at home, but quickly vomited and then felt as though it made things worse. She denies similar symptoms previously.   A CT of the abdomen and pelvis was obtained and demonstrates a small bowel obstruction with transition point in the mid abdomen, with findings suggestive of an internal hernia that site. Admitted with SBO, surgery consulted and helping with management.   Assessment & Plan   Small bowel obstruction -CT abd/pelvis demonstrates SBO with findings suggestive of internal hernia. Transition point.  -Continue with IV fluids. -General surgery consulted and appreciated -KUB showed SBO pattern, stool burden -Gastrograffin given. KUB  continued to show SBO -Continue cipro/flagyl -patient did have NGT placed, but pulled out and refused replacement -Patient actively vomiting today.  Have asked for NG tube to be reinserted -patient to receive enema today to help with stool load -Repeat imaging tonight.  If persistent SBO- will likely need surgery  Palpitations/ Frequents PVC.  -Improved after IV fluids. -Magnesium replaced. Continue to replace potassium -Continue coreg  MVR, 2014 -Echocardiogram in 2017: Mitral valve: An annular ring prosthesis was present. Mobility was restricted. Transvalvular velocity was increased, due to stenosis. The findings are consistent with moderate stenosis. -Dr. Jacinto Halim aware of patient's admission.   Essential hypertension -Continue coreg   Anxiety -Continue ativan PRN    Hypokalemia/Hypomagnesemia -Replacing. Continue to monitor  DVT Prophylaxis  lovenox  Code Status: Full  Family Communication: None at bedside  Disposition Plan: Admitted. Continue to monitor- NG tube to be reinserted today. Pending enema and repeat imaging.   Consultants Cardiology General surgery  Procedures  None  Antibiotics   Anti-infectives    Start     Dose/Rate Route Frequency Ordered Stop   05/18/16 1730  ciprofloxacin (CIPRO) IVPB 400 mg     400 mg 200 mL/hr over 60 Minutes Intravenous Every 12 hours 05/18/16 1721     05/18/16 0800  ciprofloxacin (CIPRO) tablet 500 mg  Status:  Discontinued     500 mg Oral 2 times daily 05/18/16 0749 05/18/16 1721   05/18/16 0800  metroNIDAZOLE (FLAGYL) tablet 500 mg     500 mg Oral Every 8 hours 05/18/16 0749        Subjective:   [name] [name] seen and examined today.  Patient continue to feel nauseated and have active vomiting.  Feels we are torturing her. Denies  chest pain, shortness of breath, dizziness, headache.   Objective:   Vitals:   05/19/16 0759 05/19/16 1538 05/19/16 2046 05/20/16 0349  BP: (!) 152/76 (!) 166/78 (!) 157/74 (!)  157/82  Pulse: (!) 44 88 94 60  Resp: 20 20 19 18   Temp: 98.2 F (36.8 C) 98.8 F (37.1 C) 98.4 F (36.9 C) 98.4 F (36.9 C)  TempSrc: Oral Oral Oral Oral  SpO2: 100% 100% 99% 100%  Weight:    51.7 kg (113 lb 14.4 oz)  Height:        Intake/Output Summary (Last 24 hours) at 05/20/16 1129 Last data filed at 05/20/16 0600  Gross per 24 hour  Intake          1306.66 ml  Output             1151 ml  Net           155.66 ml   Filed Weights   05/18/16 0554 05/19/16 0334 05/20/16 0349  Weight: 51.6 kg (113 lb 12.8 oz) 51.5 kg (113 lb 9.6 oz) 51.7 kg (113 lb 14.4 oz)    Exam  General: Well developed, well nourished, NAD, appears stated age  HEENT: NCAT, mucous membranes moist.   Cardiovascular: S1 S2 auscultated, no murmurs, RRR  Respiratory: Clear to auscultation  Abdomen: Soft, generalized TTP, nondistended, + bowel sounds  Extremities: warm dry without cyanosis clubbing or edema  Neuro: AAOx3, nonfocal  Psych: Anxious    Data Reviewed: I have personally reviewed following labs and imaging studies  CBC:  Recent Labs Lab 05/15/16 1713 05/17/16 0459 05/19/16 0528 05/20/16 0413  WBC 10.2 10.1 8.7 11.0*  NEUTROABS 7.8*  --   --   --   HGB 12.8 12.1 12.0 12.8  HCT 38.6 37.2 35.6* 37.9  MCV 88.3 87.9 86.2 86.9  PLT 209 222 207 239   Basic Metabolic Panel:  Recent Labs Lab 05/15/16 1713 05/16/16 0711 05/17/16 0459 05/18/16 0342 05/19/16 0528 05/20/16 0413  NA 137 137 139 135 135 137  K 3.8 4.4 3.5 2.9* 3.1* 3.0*  CL 103 107 108 103 101 101  CO2 25 25 22 24 26 27   GLUCOSE 99 92 159* 142* 172* 163*  BUN 11 9 7  <5* 7 11  CREATININE 0.76 0.80 0.70 0.68 0.62 0.66  CALCIUM 10.1 9.3 9.0 9.2 9.5 9.8  MG 1.8  --  1.6* 1.9  --  1.7   GFR: Estimated Creatinine Clearance: 50.4 mL/min (by C-G formula based on SCr of 0.66 mg/dL). Liver Function Tests:  Recent Labs Lab 05/15/16 1713  AST 24  ALT 21  ALKPHOS 72  BILITOT 0.7  PROT 6.6  ALBUMIN 4.0     Recent Labs Lab 05/15/16 1713  LIPASE 24   No results for input(s): AMMONIA in the last 168 hours. Coagulation Profile: No results for input(s): INR, PROTIME in the last 168 hours. Cardiac Enzymes:  Recent Labs Lab 05/16/16 0139 05/16/16 0711  TROPONINI <0.03 <0.03   BNP (last 3 results) No results for input(s): PROBNP in the last 8760 hours. HbA1C: No results for input(s): HGBA1C in the last 72 hours. CBG:  Recent Labs Lab 05/16/16 0647 05/17/16 0558 05/18/16 0636 05/19/16 0608 05/20/16 0600  GLUCAP 87 157* 133* 170* 147*   Lipid Profile: No results for input(s): CHOL, HDL, LDLCALC, TRIG, CHOLHDL, LDLDIRECT in the last 72 hours. Thyroid Function Tests: No results for input(s): TSH, T4TOTAL, FREET4, T3FREE, THYROIDAB in the last 72 hours. Anemia Panel: No  results for input(s): VITAMINB12, FOLATE, FERRITIN, TIBC, IRON, RETICCTPCT in the last 72 hours. Urine analysis:    Component Value Date/Time   COLORURINE YELLOW 05/15/2016 2146   APPEARANCEUR CLEAR 05/15/2016 2146   LABSPEC 1.015 05/15/2016 2146   PHURINE 7.0 05/15/2016 2146   GLUCOSEU NEGATIVE 05/15/2016 2146   HGBUR MODERATE (A) 05/15/2016 2146   BILIRUBINUR NEGATIVE 05/15/2016 2146   BILIRUBINUR negative 11/02/2014 1336   KETONESUR 15 (A) 05/15/2016 2146   PROTEINUR NEGATIVE 05/15/2016 2146   UROBILINOGEN 0.2 11/02/2014 1336   UROBILINOGEN 0.2 02/10/2013 1615   NITRITE NEGATIVE 05/15/2016 2146   LEUKOCYTESUR NEGATIVE 05/15/2016 2146   Sepsis Labs: @LABRCNTIP (procalcitonin:4,lacticidven:4)  )No results found for this or any previous visit (from the past 240 hour(s)).    Radiology Studies: Dg Abd 2 Views  Result Date: 05/18/2016 CLINICAL DATA:  75 year old female with small bowel obstruction. Subsequent encounter. EXAM: ABDOMEN - 2 VIEW COMPARISON:  05/17/2016 FINDINGS: Persistent gas distended small bowel loops with thickened folds consistent with small bowel obstruction without improvement.  No free air. Stool right colon. IMPRESSION: Persistent high-grade small bowel obstruction. Electronically Signed   By: Lacy Duverney M.D.   On: 05/18/2016 13:23   Dg Abd Portable 1v-small Bowel Obstruction Protocol-initial, 8 Hr Delay  Result Date: 05/19/2016 CLINICAL DATA:  Small bowel obstruction. EXAM: PORTABLE ABDOMEN - 1 VIEW COMPARISON:  Earlier the same day. FINDINGS: AP supine portable study at 2031 hours shows contrast material in the stomach and proximal small bowel. Small bowel loops remain dilated with gas filled with left abdominal loop measuring up to 5.4 cm diameter. No evidence for contrast material in the colon. IMPRESSION: Imaging features remain consistent with small bowel obstruction. Electronically Signed   By: Kennith Center M.D.   On: 05/19/2016 22:28     Scheduled Meds: . carvedilol  3.125 mg Oral BID WC  . ciprofloxacin  400 mg Intravenous Q12H  . enoxaparin (LOVENOX) injection  40 mg Subcutaneous Q24H  . famotidine (PEPCID) IV  20 mg Intravenous Q12H  . feeding supplement  1 Container Oral BID BM  . magnesium sulfate 1 - 4 g bolus IVPB  2 g Intravenous Once  . mouth rinse  15 mL Mouth Rinse BID  . metroNIDAZOLE  500 mg Oral Q8H  . pantoprazole (PROTONIX) IV  40 mg Intravenous Q12H  . sodium chloride flush  3 mL Intravenous Q12H   Continuous Infusions: . dextrose 5 % and 0.9% NaCl 100 mL/hr at 05/20/16 0510     LOS: 4 days   Time Spent in minutes   30 minutes  [name] [name] D.O. on 05/20/2016 at 11:29 AM  Between 7am to 7pm - Pager - [phone]  After 7pm go to [email or web address] - password [email or web address]  And look for the night coverage person covering for me after hours  [facility] Office  [phone]

## 2016-05-20 NOTE — Progress Notes (Addendum)
[employer]  IV ordered. Will continue to monitor.  Brylen Wagar, RN

## 2016-05-20 NOTE — Progress Notes (Signed)
Pt given soap suds enema.  Pt had a very small BM following enema.  Pt is actively vomiting brown fluid.  Pt ultimately needs NG tube placed per MD.  Pt is agreeable to this today.  Will place tube and continue to monitor.

## 2016-05-20 NOTE — Progress Notes (Addendum)
PRN antiemetic effective. Will continue to monitor.   [place] Kirchner, RN

## 2016-05-20 NOTE — Progress Notes (Signed)
Advanced NG tube 10 cm with no complications.  Hooked to low intermittent suction.  Will continue to monitor.

## 2016-05-21 ENCOUNTER — Encounter (HOSPITAL_COMMUNITY)
Admission: EM | Disposition: A | Discharge status: Skilled Nursing Facility | Payer: Self-pay | Source: Home / Self Care | Attending: Internal Medicine

## 2016-05-21 ENCOUNTER — Inpatient Hospital Stay (HOSPITAL_COMMUNITY): Payer: PPO | Admitting: Certified Registered"

## 2016-05-21 ENCOUNTER — Encounter (HOSPITAL_COMMUNITY): Payer: Self-pay | Admitting: Certified Registered"

## 2016-05-21 HISTORY — PX: LAPAROSCOPY: SHX197

## 2016-05-21 LAB — CBC
HEMATOCRIT: 35.9 % — AB (ref 36.0–46.0)
HEMOGLOBIN: 11.8 g/dL — AB (ref 12.0–15.0)
MCH: 28.9 pg (ref 26.0–34.0)
MCHC: 32.9 g/dL (ref 30.0–36.0)
MCV: 88 fL (ref 78.0–100.0)
Platelets: 214 10*3/uL (ref 150–400)
RBC: 4.08 MIL/uL (ref 3.87–5.11)
RDW: 13.5 % (ref 11.5–15.5)
WBC: 10.8 10*3/uL — ABNORMAL HIGH (ref 4.0–10.5)

## 2016-05-21 LAB — BASIC METABOLIC PANEL
Anion gap: 7 (ref 5–15)
BUN: 13 mg/dL (ref 6–20)
CHLORIDE: 102 mmol/L (ref 101–111)
CO2: 30 mmol/L (ref 22–32)
CREATININE: 0.76 mg/dL (ref 0.44–1.00)
Calcium: 9.5 mg/dL (ref 8.9–10.3)
GFR calc Af Amer: >60 mL/min (ref >60)
GFR calc non Af Amer: >60 mL/min (ref >60)
Glucose, Bld: 106 mg/dL — ABNORMAL HIGH (ref 65–99)
Potassium: 3 mmol/L — ABNORMAL LOW (ref 3.5–5.1)
Sodium: 139 mmol/L (ref 135–145)

## 2016-05-21 LAB — GLUCOSE, CAPILLARY: Glucose-Capillary: 105 mg/dL — ABNORMAL HIGH (ref 65–99)

## 2016-05-21 LAB — MAGNESIUM: Magnesium: 2.2 mg/dL (ref 1.7–2.4)

## 2016-05-21 LAB — SURGICAL PCR SCREEN
MRSA, PCR: NEGATIVE
STAPHYLOCOCCUS AUREUS: NEGATIVE

## 2016-05-21 SURGERY — LAPAROSCOPY, DIAGNOSTIC
Anesthesia: General | Site: Abdomen

## 2016-05-21 MED ORDER — ONDANSETRON HCL 4 MG/2ML IJ SOLN
INTRAMUSCULAR | Status: AC
  Filled 2016-05-21: qty 2

## 2016-05-21 MED ORDER — ROCURONIUM BROMIDE 10 MG/ML (PF) SYRINGE
PREFILLED_SYRINGE | INTRAVENOUS | Status: DC | PRN
  Administered 2016-05-21: 30 mg via INTRAVENOUS

## 2016-05-21 MED ORDER — LIDOCAINE 2% (20 MG/ML) 5 ML SYRINGE
INTRAMUSCULAR | Status: DC | PRN
  Administered 2016-05-21: 60 mg via INTRAVENOUS

## 2016-05-21 MED ORDER — HYDROMORPHONE HCL 1 MG/ML IJ SOLN
INTRAMUSCULAR | Status: AC
  Filled 2016-05-21: qty 0.5

## 2016-05-21 MED ORDER — ROCURONIUM BROMIDE 50 MG/5ML IV SOSY
PREFILLED_SYRINGE | INTRAVENOUS | Status: AC
  Filled 2016-05-21: qty 5

## 2016-05-21 MED ORDER — HYDROMORPHONE HCL 1 MG/ML IJ SOLN
0.2500 mg | INTRAMUSCULAR | Status: DC | PRN
  Administered 2016-05-21 (×2): 0.5 mg via INTRAVENOUS

## 2016-05-21 MED ORDER — OXYCODONE HCL 5 MG PO TABS: 5.0000 mg | ORAL_TABLET | Freq: Once | ORAL | Status: DC | PRN

## 2016-05-21 MED ORDER — SUGAMMADEX SODIUM 200 MG/2ML IV SOLN
INTRAVENOUS | Status: DC | PRN
  Administered 2016-05-21: 100 mg via INTRAVENOUS

## 2016-05-21 MED ORDER — PHENYLEPHRINE 40 MCG/ML (10ML) SYRINGE FOR IV PUSH (FOR BLOOD PRESSURE SUPPORT)
PREFILLED_SYRINGE | INTRAVENOUS | Status: AC
  Filled 2016-05-21: qty 10

## 2016-05-21 MED ORDER — PROPOFOL 10 MG/ML IV BOLUS
INTRAVENOUS | Status: AC
  Filled 2016-05-21: qty 20

## 2016-05-21 MED ORDER — FENTANYL CITRATE (PF) 100 MCG/2ML IJ SOLN
INTRAMUSCULAR | Status: DC | PRN
  Administered 2016-05-21: 100 ug via INTRAVENOUS
  Administered 2016-05-21: 50 ug via INTRAVENOUS

## 2016-05-21 MED ORDER — FENTANYL CITRATE (PF) 100 MCG/2ML IJ SOLN
INTRAMUSCULAR | Status: AC
  Filled 2016-05-21: qty 4

## 2016-05-21 MED ORDER — PHENYLEPHRINE HCL 10 MG/ML IJ SOLN
INTRAMUSCULAR | Status: DC | PRN
  Administered 2016-05-21: 50 ug/min via INTRAVENOUS

## 2016-05-21 MED ORDER — LACTATED RINGERS IV SOLN
INTRAVENOUS | Status: DC
  Administered 2016-05-21: 11:00:00 via INTRAVENOUS

## 2016-05-21 MED ORDER — BUPIVACAINE HCL 0.25 % IJ SOLN
INTRAMUSCULAR | Status: DC | PRN
  Administered 2016-05-21: 4 mL

## 2016-05-21 MED ORDER — SUGAMMADEX SODIUM 200 MG/2ML IV SOLN
INTRAVENOUS | Status: AC
  Filled 2016-05-21: qty 2

## 2016-05-21 MED ORDER — 0.9 % SODIUM CHLORIDE (POUR BTL) OPTIME
TOPICAL | Status: DC | PRN
  Administered 2016-05-21: 1000 mL

## 2016-05-21 MED ORDER — CIPROFLOXACIN IN D5W 400 MG/200ML IV SOLN
400.0000 mg | Freq: Two times a day (BID) | INTRAVENOUS | Status: DC
  Administered 2016-05-21 – 2016-05-26 (×10): 400 mg via INTRAVENOUS
  Filled 2016-05-21 (×10): qty 200

## 2016-05-21 MED ORDER — LIDOCAINE 2% (20 MG/ML) 5 ML SYRINGE
INTRAMUSCULAR | Status: AC
  Filled 2016-05-21: qty 5

## 2016-05-21 MED ORDER — ONDANSETRON HCL 4 MG/2ML IJ SOLN: 4.0000 mg | Freq: Four times a day (QID) | INTRAMUSCULAR | Status: DC | PRN

## 2016-05-21 MED ORDER — SUCCINYLCHOLINE CHLORIDE 200 MG/10ML IV SOSY
PREFILLED_SYRINGE | INTRAVENOUS | Status: DC | PRN
  Administered 2016-05-21: 100 mg via INTRAVENOUS

## 2016-05-21 MED ORDER — PROPOFOL 10 MG/ML IV BOLUS
INTRAVENOUS | Status: DC | PRN
  Administered 2016-05-21: 110 mg via INTRAVENOUS

## 2016-05-21 MED ORDER — PHENOL 1.4 % MT LIQD
1.0000 | OROMUCOSAL | Status: DC | PRN
  Administered 2016-05-21: 1 via OROMUCOSAL
  Filled 2016-05-21: qty 177

## 2016-05-21 MED ORDER — PHENYLEPHRINE 40 MCG/ML (10ML) SYRINGE FOR IV PUSH (FOR BLOOD PRESSURE SUPPORT)
PREFILLED_SYRINGE | INTRAVENOUS | Status: DC | PRN
  Administered 2016-05-21 (×2): 120 ug via INTRAVENOUS

## 2016-05-21 MED ORDER — BUPIVACAINE HCL (PF) 0.25 % IJ SOLN
INTRAMUSCULAR | Status: AC
  Filled 2016-05-21: qty 30

## 2016-05-21 MED ORDER — MIDAZOLAM HCL 2 MG/2ML IJ SOLN
INTRAMUSCULAR | Status: DC | PRN
  Administered 2016-05-21: 2 mg via INTRAVENOUS

## 2016-05-21 MED ORDER — SUCCINYLCHOLINE CHLORIDE 200 MG/10ML IV SOSY
PREFILLED_SYRINGE | INTRAVENOUS | Status: AC
  Filled 2016-05-21: qty 10

## 2016-05-21 MED ORDER — MIDAZOLAM HCL 2 MG/2ML IJ SOLN
INTRAMUSCULAR | Status: AC
  Filled 2016-05-21: qty 2

## 2016-05-21 MED ORDER — OXYCODONE HCL 5 MG/5ML PO SOLN: 5.0000 mg | Freq: Once | ORAL | Status: DC | PRN

## 2016-05-21 MED ORDER — ONDANSETRON HCL 4 MG/2ML IJ SOLN
INTRAMUSCULAR | Status: DC | PRN
  Administered 2016-05-21: 4 mg via INTRAVENOUS

## 2016-05-21 MED ORDER — SODIUM CHLORIDE 0.9 % IV SOLN
30.0000 meq | Freq: Once | INTRAVENOUS | Status: AC
  Administered 2016-05-21: 30 meq via INTRAVENOUS
  Filled 2016-05-21: qty 15

## 2016-05-21 SURGICAL SUPPLY — 62 items
ADH SKN CLS APL DERMABOND .7 (GAUZE/BANDAGES/DRESSINGS) ×2
APL SKNCLS STERI-STRIP NONHPOA (GAUZE/BANDAGES/DRESSINGS) ×2
BENZOIN TINCTURE PRP APPL 2/3 (GAUZE/BANDAGES/DRESSINGS) ×4 IMPLANT
BLADE SURG ROTATE 9660 (MISCELLANEOUS) IMPLANT
CANISTER SUCTION 2500CC (MISCELLANEOUS) ×4 IMPLANT
CHLORAPREP W/TINT 26ML (MISCELLANEOUS) ×4 IMPLANT
CLOSURE WOUND 1/2 X4 (GAUZE/BANDAGES/DRESSINGS) ×1
COVER SURGICAL LIGHT HANDLE (MISCELLANEOUS) ×4 IMPLANT
DERMABOND ADVANCED (GAUZE/BANDAGES/DRESSINGS) ×2
DERMABOND ADVANCED .7 DNX12 (GAUZE/BANDAGES/DRESSINGS) ×2 IMPLANT
DRAPE LAPAROSCOPIC ABDOMINAL (DRAPES) ×4 IMPLANT
DRAPE WARM FLUID 44X44 (DRAPE) ×4 IMPLANT
DRSG OPSITE POSTOP 4X10 (GAUZE/BANDAGES/DRESSINGS) IMPLANT
DRSG OPSITE POSTOP 4X8 (GAUZE/BANDAGES/DRESSINGS) IMPLANT
ELECT BLADE 6.5 EXT (BLADE) IMPLANT
ELECT CAUTERY BLADE 6.4 (BLADE) ×4 IMPLANT
ELECT REM PT RETURN 9FT ADLT (ELECTROSURGICAL) ×4
ELECTRODE REM PT RTRN 9FT ADLT (ELECTROSURGICAL) ×2 IMPLANT
GAUZE SPONGE 2X2 8PLY STRL LF (GAUZE/BANDAGES/DRESSINGS) ×2 IMPLANT
GLOVE BIO SURGEON STRL SZ7.5 (GLOVE) ×4 IMPLANT
GLOVE BIOGEL PI IND STRL 7.0 (GLOVE) ×2 IMPLANT
GLOVE BIOGEL PI IND STRL 8 (GLOVE) ×2 IMPLANT
GLOVE BIOGEL PI INDICATOR 7.0 (GLOVE) ×2
GLOVE BIOGEL PI INDICATOR 8 (GLOVE) ×2
GLOVE SURG SS PI 6.5 STRL IVOR (GLOVE) ×4 IMPLANT
GOWN STRL REUS W/ TWL LRG LVL3 (GOWN DISPOSABLE) ×4 IMPLANT
GOWN STRL REUS W/ TWL XL LVL3 (GOWN DISPOSABLE) ×2 IMPLANT
GOWN STRL REUS W/TWL LRG LVL3 (GOWN DISPOSABLE) ×8
GOWN STRL REUS W/TWL XL LVL3 (GOWN DISPOSABLE) ×4
KIT BASIN OR (CUSTOM PROCEDURE TRAY) ×4 IMPLANT
KIT ROOM TURNOVER OR (KITS) ×4 IMPLANT
LIGASURE IMPACT 36 18CM CVD LR (INSTRUMENTS) IMPLANT
NS IRRIG 1000ML POUR BTL (IV SOLUTION) ×8 IMPLANT
PACK GENERAL/GYN (CUSTOM PROCEDURE TRAY) IMPLANT
PAD ARMBOARD 7.5X6 YLW CONV (MISCELLANEOUS) ×8 IMPLANT
PENCIL BUTTON HOLSTER BLD 10FT (ELECTRODE) ×4 IMPLANT
SCISSORS LAP 5X35 DISP (ENDOMECHANICALS) IMPLANT
SET IRRIG TUBING LAPAROSCOPIC (IRRIGATION / IRRIGATOR) IMPLANT
SLEEVE ENDOPATH XCEL 5M (ENDOMECHANICALS) ×12 IMPLANT
SPECIMEN JAR LARGE (MISCELLANEOUS) IMPLANT
SPONGE GAUZE 2X2 STER 10/PKG (GAUZE/BANDAGES/DRESSINGS) ×2
SPONGE LAP 18X18 X RAY DECT (DISPOSABLE) IMPLANT
STAPLER VISISTAT 35W (STAPLE) ×4 IMPLANT
STRIP CLOSURE SKIN 1/2X4 (GAUZE/BANDAGES/DRESSINGS) ×3 IMPLANT
SUCTION POOLE TIP (SUCTIONS) IMPLANT
SUT MNCRL AB 4-0 PS2 18 (SUTURE) ×8 IMPLANT
SUT PDS AB 1 TP1 96 (SUTURE) IMPLANT
SUT SILK 2 0 SH CR/8 (SUTURE) ×4 IMPLANT
SUT SILK 2 0 TIES 10X30 (SUTURE) ×4 IMPLANT
SUT SILK 3 0 SH 30 (SUTURE) ×8 IMPLANT
SUT SILK 3 0 SH CR/8 (SUTURE) ×4 IMPLANT
SUT SILK 3 0 TIES 10X30 (SUTURE) ×4 IMPLANT
TOWEL OR 17X24 6PK STRL BLUE (TOWEL DISPOSABLE) IMPLANT
TOWEL OR 17X26 10 PK STRL BLUE (TOWEL DISPOSABLE) ×4 IMPLANT
TRAY FOLEY CATH 16FRSI W/METER (SET/KITS/TRAYS/PACK) ×4 IMPLANT
TRAY LAPAROSCOPIC MC (CUSTOM PROCEDURE TRAY) ×4 IMPLANT
TROCAR XCEL 12X100 BLDLESS (ENDOMECHANICALS) IMPLANT
TROCAR XCEL BLUNT TIP 100MML (ENDOMECHANICALS) IMPLANT
TROCAR XCEL NON-BLD 11X100MML (ENDOMECHANICALS) IMPLANT
TROCAR XCEL NON-BLD 5MMX100MML (ENDOMECHANICALS) ×4 IMPLANT
TUBING INSUFFLATION (TUBING) ×4 IMPLANT
YANKAUER SUCT BULB TIP NO VENT (SUCTIONS) ×4 IMPLANT

## 2016-05-21 NOTE — Progress Notes (Signed)
Pt sent down to OR with 2 rings on. This RN went down to pick up patients rings, they are in a labeled bag inside her makeup bag in the top drawer at bedside. Jilda Panda, RN was witness to this.

## 2016-05-21 NOTE — Anesthesia Postprocedure Evaluation (Signed)
Anesthesia Post Note  Patient: [name] [name]  Procedure(s) Performed: Procedure(s) (LRB): LAPAROSCOPY DIAGNOSTIC, LYSIS OF ADHESION BAND (N/A)  Patient location during evaluation: PACU Anesthesia Type: General Level of consciousness: awake and alert and patient cooperative Pain management: pain level controlled Vital Signs Assessment: post-procedure vital signs reviewed and stable Respiratory status: spontaneous breathing and respiratory function stable Cardiovascular status: stable Anesthetic complications: no       Last Vitals:  Vitals:   05/21/16 1317 05/21/16 1443  BP:  98/63  Pulse: 90 99  Resp: 16 18  Temp: 36.1 C 36.8 C    Last Pain:  Vitals:   05/21/16 1443  TempSrc: Oral  PainSc:                  Del Wiseman S

## 2016-05-21 NOTE — Progress Notes (Signed)
PROGRESS NOTE    [name] [name]  ZOX:[id number] DOB: 01-14-1941 DOA: 05/15/2016 PCP: Minda Meo, MD   Chief Complaint  Patient presents with  . Palpitations    Brief Narrative:  HPI on 05/15/2016 by Dr. Marcial Pacas Opyd [name] [name] is a 76 y.o. female with medical history significant for hypertension, coronary artery disease, GERD, hyperthyroidism, and mitral valve prolapse status post MVR who presents to the emergency department with one-day vomiting and palpitations. Patient reports that she had been in her usual state of health until shortly after breakfast when she developed acute nausea with nonbloody nonbilious vomiting. These symptoms persisted throughout the morning and afternoon and she developed palpitations. She reports a history of anxiety and has been very concerned with the palpitations, prompting her presentation to the ED. She reports a normal bowel movement the day prior. She reports mid-abdominal pain, described as cramping, moderate in intensity, localized to the mid-abdomen, and without associated fevers, chills, or diarrhea. Patient had eaten organic eggs for breakfast and suspects that has causing her symptoms. She has not been able to tolerate anything by mouth since the vomiting developed. She tried taking the Protonix at home, but quickly vomited and then felt as though it made things worse. She denies similar symptoms previously.   A CT of the abdomen and pelvis was obtained and demonstrates a small bowel obstruction with transition point in the mid abdomen, with findings suggestive of an internal hernia that site. Admitted with SBO, surgery consulted and helping with management.   Assessment & Plan   Small bowel obstruction -CT abd/pelvis demonstrates SBO with findings suggestive of internal hernia. Transition point.  -Continue with IV fluids. -General surgery consulted and appreciated -KUB showed SBO pattern, stool burden -Gastrograffin given. KUB  continued to show SBO -Continue cipro/flagyl -NG tube reinserted on 05/20/2016. -Successful bowel movement on 05/20/2016 with enema -Repeat xray on 1/25 showed persistent SBO -Plan for exploratory laparoscopy with possible resection  Palpitations/ Frequents PVC.  -Improved after IV fluids. -Magnesium replaced. Continue to replace potassium -Coreg held due to NPO status and surgery  MVR, 2014 -Echocardiogram in 2017: Mitral valve: An annular ring prosthesis was present. Mobility was restricted. Transvalvular velocity was increased, due to stenosis. The findings are consistent with moderate stenosis. -Dr. Jacinto Halim aware of patient's admission.   Essential hypertension -BP stable, Coreg held  Anxiety -Continue ativan PRN    Hypokalemia/Hypomagnesemia -Replacing. Continue to monitor  DVT Prophylaxis  lovenox  Code Status: Full  Family Communication: None at bedside  Disposition Plan: Admitted. Plan for exp lap today.   Consultants Cardiology General surgery  Procedures  None  Antibiotics   Anti-infectives    Start     Dose/Rate Route Frequency Ordered Stop   05/20/16 2100  [MAR Hold]  metroNIDAZOLE (FLAGYL) IVPB 500 mg     (MAR Hold since 05/21/16 1021)   500 mg 100 mL/hr over 60 Minutes Intravenous Every 8 hours 05/20/16 2048     05/18/16 1730  [MAR Hold]  ciprofloxacin (CIPRO) IVPB 400 mg     (MAR Hold since 05/21/16 1021)   400 mg 200 mL/hr over 60 Minutes Intravenous Every 12 hours 05/18/16 1721     05/18/16 0800  ciprofloxacin (CIPRO) tablet 500 mg  Status:  Discontinued     500 mg Oral 2 times daily 05/18/16 0749 05/18/16 1721   05/18/16 0800  metroNIDAZOLE (FLAGYL) tablet 500 mg  Status:  Discontinued     500 mg Oral Every 8 hours 05/18/16 0749 05/20/16  2048      Subjective:   [name] [name] seen and examined today.  Patient no longer feels nauseated after NG tube placed.  Had bowel movement overnight. Very anxious about her surgery. Denies chest pain,  shortness of breath, dizziness, headache.   Objective:   Vitals:   05/20/16 2100 05/21/16 0217 05/21/16 0406 05/21/16 0829  BP: (!) 147/59 (!) 156/79 (!) 144/76 134/64  Pulse: 88 75 91 89  Resp: 18  19 18   Temp: 98.8 F (37.1 C)  98.2 F (36.8 C) 98.2 F (36.8 C)  TempSrc: Oral  Oral Oral  SpO2: 98%  100% 100%  Weight:   51 kg (112 lb 6.4 oz)   Height:        Intake/Output Summary (Last 24 hours) at 05/21/16 1120 Last data filed at 05/21/16 0953  Gross per 24 hour  Intake             3315 ml  Output             2270 ml  Net             1045 ml   Filed Weights   05/19/16 0334 05/20/16 0349 05/21/16 0406  Weight: 51.5 kg (113 lb 9.6 oz) 51.7 kg (113 lb 14.4 oz) 51 kg (112 lb 6.4 oz)    Exam  General: Well developed, well nourished, NAD, appears stated age  HEENT: NCAT, mucous membranes moist. NG tube in place  Cardiovascular: S1 S2 auscultated, no murmurs, RRR  Respiratory: Clear to auscultation  Abdomen: Soft, generalized TTP, nondistended, + bowel sounds  Extremities: warm dry without cyanosis clubbing or edema  Neuro: AAOx3, nonfocal  Psych: Anxious    Data Reviewed: I have personally reviewed following labs and imaging studies  CBC:  Recent Labs Lab 05/15/16 1713 05/17/16 0459 05/19/16 0528 05/20/16 0413 05/21/16 0352  WBC 10.2 10.1 8.7 11.0* 10.8*  NEUTROABS 7.8*  --   --   --   --   HGB 12.8 12.1 12.0 12.8 11.8*  HCT 38.6 37.2 35.6* 37.9 35.9*  MCV 88.3 87.9 86.2 86.9 88.0  PLT 209 222 207 239 214   Basic Metabolic Panel:  Recent Labs Lab 05/15/16 1713  05/17/16 0459 05/18/16 0342 05/19/16 0528 05/20/16 0413 05/21/16 0352  NA 137  < > 139 135 135 137 139  K 3.8  < > 3.5 2.9* 3.1* 3.0* 3.0*  CL 103  < > 108 103 101 101 102  CO2 25  < > 22 24 26 27 30   GLUCOSE 99  < > 159* 142* 172* 163* 106*  BUN 11  < > 7 <5* 7 11 13   CREATININE 0.76  < > 0.70 0.68 0.62 0.66 0.76  CALCIUM 10.1  < > 9.0 9.2 9.5 9.8 9.5  MG 1.8  --  1.6* 1.9  --   1.7 2.2  < > = values in this interval not displayed. GFR: Estimated Creatinine Clearance: 49.7 mL/min (by C-G formula based on SCr of 0.76 mg/dL). Liver Function Tests:  Recent Labs Lab 05/15/16 1713  AST 24  ALT 21  ALKPHOS 72  BILITOT 0.7  PROT 6.6  ALBUMIN 4.0    Recent Labs Lab 05/15/16 1713  LIPASE 24   No results for input(s): AMMONIA in the last 168 hours. Coagulation Profile: No results for input(s): INR, PROTIME in the last 168 hours. Cardiac Enzymes:  Recent Labs Lab 05/16/16 0139 05/16/16 0711  TROPONINI <0.03 <0.03   BNP (last 3  results) No results for input(s): PROBNP in the last 8760 hours. HbA1C: No results for input(s): HGBA1C in the last 72 hours. CBG:  Recent Labs Lab 05/17/16 0558 05/18/16 0636 05/19/16 0608 05/20/16 0600 05/21/16 0532  GLUCAP 157* 133* 170* 147* 105*   Lipid Profile: No results for input(s): CHOL, HDL, LDLCALC, TRIG, CHOLHDL, LDLDIRECT in the last 72 hours. Thyroid Function Tests: No results for input(s): TSH, T4TOTAL, FREET4, T3FREE, THYROIDAB in the last 72 hours. Anemia Panel: No results for input(s): VITAMINB12, FOLATE, FERRITIN, TIBC, IRON, RETICCTPCT in the last 72 hours. Urine analysis:    Component Value Date/Time   COLORURINE YELLOW 05/15/2016 2146   APPEARANCEUR CLEAR 05/15/2016 2146   LABSPEC 1.015 05/15/2016 2146   PHURINE 7.0 05/15/2016 2146   GLUCOSEU NEGATIVE 05/15/2016 2146   HGBUR MODERATE (A) 05/15/2016 2146   BILIRUBINUR NEGATIVE 05/15/2016 2146   BILIRUBINUR negative 11/02/2014 1336   KETONESUR 15 (A) 05/15/2016 2146   PROTEINUR NEGATIVE 05/15/2016 2146   UROBILINOGEN 0.2 11/02/2014 1336   UROBILINOGEN 0.2 02/10/2013 1615   NITRITE NEGATIVE 05/15/2016 2146   LEUKOCYTESUR NEGATIVE 05/15/2016 2146   Sepsis Labs: @LABRCNTIP (procalcitonin:4,lacticidven:4)  )No results found for this or any previous visit (from the past 240 hour(s)).    Radiology Studies: Dg Abd Portable 1v  Result  Date: 05/20/2016 CLINICAL DATA:  Follow-up small bowel obstruction. Initial encounter. EXAM: PORTABLE ABDOMEN - 1 VIEW COMPARISON:  Abdominal radiograph performed 05/20/2016 FINDINGS: There is persistent dilatation of small-bowel loops to 4.9 cm in diameter, concerning for relatively high-grade small bowel obstruction. Contrast is seen within the small bowel. No free intra-abdominal air is identified, though evaluation for free air is limited on a single supine view. Stool is noted within the ascending colon. The visualized osseous structures are within normal limits; the sacroiliac joints are unremarkable in appearance. The visualized lung bases are essentially clear. The patient's enteric tube is noted ending overlying the fundus of the stomach, with the side port about the distal esophagus. IMPRESSION: Persistent dilatation of small-bowel loops to 4.9 cm in diameter, concerning for relatively high-grade small bowel obstruction. Contrast remains within the small bowel. No free intra-abdominal air seen. Electronically Signed   By: Roanna Raider M.D.   On: 05/20/2016 20:51   Dg Abd Portable 1v  Result Date: 05/20/2016 CLINICAL DATA:  NG tube placement, history of small-bowel obstruction EXAM: PORTABLE ABDOMEN - 1 VIEW COMPARISON:  KUB of 05/19/2016 and CT abdomen and pelvis of 05/15/2016 FINDINGS: The tip of the NG tube overlies the distal esophagus and needs to be advanced by approximately 10 to 15 cm into the stomach. There is persistent dilatation of small bowel consistent with small bowel obstruction. The stomach appears fluid filled. IMPRESSION: NG tube tip within the distal esophagus and needs to be advanced at least 10 cm. Little change in small bowel obstructive pattern. Electronically Signed   By: Dwyane Dee M.D.   On: 05/20/2016 12:44   Dg Abd Portable 1v-small Bowel Obstruction Protocol-initial, 8 Hr Delay  Result Date: 05/19/2016 CLINICAL DATA:  Small bowel obstruction. EXAM: PORTABLE ABDOMEN -  1 VIEW COMPARISON:  Earlier the same day. FINDINGS: AP supine portable study at 2031 hours shows contrast material in the stomach and proximal small bowel. Small bowel loops remain dilated with gas filled with left abdominal loop measuring up to 5.4 cm diameter. No evidence for contrast material in the colon. IMPRESSION: Imaging features remain consistent with small bowel obstruction. Electronically Signed   By: Jamison Oka.D.  On: 05/19/2016 22:28     Scheduled Meds: . [MAR Hold] carvedilol  3.125 mg Oral BID WC  . [MAR Hold] ciprofloxacin  400 mg Intravenous Q12H  . [MAR Hold] enoxaparin (LOVENOX) injection  40 mg Subcutaneous Q24H  . [MAR Hold] famotidine (PEPCID) IV  20 mg Intravenous Q12H  . [MAR Hold] feeding supplement  1 Container Oral BID BM  . [MAR Hold] mouth rinse  15 mL Mouth Rinse BID  . [MAR Hold] metronidazole  500 mg Intravenous Q8H  . [MAR Hold] pantoprazole (PROTONIX) IV  40 mg Intravenous Q12H  . [MAR Hold] sodium chloride flush  3 mL Intravenous Q12H   Continuous Infusions: . dextrose 5 % and 0.9% NaCl 100 mL/hr at 05/20/16 0510  . lactated ringers       LOS: 5 days   Time Spent in minutes   30 minutes  Bunny Kleist D.O. on 05/21/2016 at 11:20 AM  Between 7am to 7pm - Pager - [phone]  After 7pm go to [email or web address] - password [email or web address]  And look for the night coverage person covering for me after hours  [facility] Office  [phone]

## 2016-05-21 NOTE — Anesthesia Preprocedure Evaluation (Signed)
Anesthesia Evaluation  Patient identified by MRN, date of birth, ID band Patient awake    Reviewed: Allergy & Precautions, H&P , NPO status , Patient's Chart, lab work & pertinent test results  History of Anesthesia Complications (+) PONV and history of anesthetic complications  Airway Mallampati: II   Neck ROM: full    Dental   Pulmonary shortness of breath,    breath sounds clear to auscultation       Cardiovascular hypertension, +CHF  + Valvular Problems/Murmurs  Rhythm:regular Rate:Normal  S/p mitral valve repair.  Now with moderate mitral stenosis.  Normal LV function per TTE 2017.   Neuro/Psych  Headaches, Anxiety    GI/Hepatic GERD  ,  Endo/Other  Hyperthyroidism   Renal/GU      Musculoskeletal   Abdominal   Peds  Hematology  (+) anemia , REFUSES BLOOD PRODUCTS, JEHOVAH'S WITNESS  Anesthesia Other Findings   Reproductive/Obstetrics                             Anesthesia Physical Anesthesia Plan  ASA: III  Anesthesia Plan: General   Post-op Pain Management:    Induction: Intravenous  Airway Management Planned: Oral ETT  Additional Equipment:   Intra-op Plan:   Post-operative Plan: Extubation in OR  Informed Consent: I have reviewed the patients History and Physical, chart, labs and discussed the procedure including the risks, benefits and alternatives for the proposed anesthesia with the patient or authorized representative who has indicated his/her understanding and acceptance.     Plan Discussed with: CRNA, Anesthesiologist and Surgeon  Anesthesia Plan Comments:         Anesthesia Quick Evaluation

## 2016-05-21 NOTE — Progress Notes (Signed)
Day of Surgery  Subjective: "I feel better."  BM with enema yesterday.  Objective: Vital signs in last 24 hours: Temp:  [98.1 F (36.7 C)-98.8 F (37.1 C)] 98.2 F (36.8 C) (01/26 0829) Pulse Rate:  [75-91] 89 (01/26 0829) Resp:  [18-22] 18 (01/26 0829) BP: (134-162)/(59-84) 134/64 (01/26 0829) SpO2:  [98 %-100 %] 100 % (01/26 0829) Weight:  [51 kg (112 lb 6.4 oz)] 51 kg (112 lb 6.4 oz) (01/26 0406) Last BM Date: 05/20/16  Intake/Output from previous day: 01/25 0701 - 01/26 0700 In: 2600 [I.V.:2500; IV Piggyback:100] Out: 2070 [Urine:250; Emesis/NG output:1820] Intake/Output this shift: Total I/O In: 665 [IV Piggyback:665] Out: -   General appearance: alert and cooperative GI: soft, distended, hypoactive BS  Lab Results:   Recent Labs  05/20/16 0413 05/21/16 0352  WBC 11.0* 10.8*  HGB 12.8 11.8*  HCT 37.9 35.9*  PLT 239 214   BMET  Recent Labs  05/20/16 0413 05/21/16 0352  NA 137 139  K 3.0* 3.0*  CL 101 102  CO2 27 30  GLUCOSE 163* 106*  BUN 11 13  CREATININE 0.66 0.76  CALCIUM 9.8 9.5   PT/INR No results for input(s): LABPROT, INR in the last 72 hours. ABG No results for input(s): PHART, HCO3 in the last 72 hours.  Invalid input(s): PCO2, PO2  Studies/Results: Dg Abd Portable 1v  Result Date: 05/20/2016 CLINICAL DATA:  Follow-up small bowel obstruction. Initial encounter. EXAM: PORTABLE ABDOMEN - 1 VIEW COMPARISON:  Abdominal radiograph performed 05/20/2016 FINDINGS: There is persistent dilatation of small-bowel loops to 4.9 cm in diameter, concerning for relatively high-grade small bowel obstruction. Contrast is seen within the small bowel. No free intra-abdominal air is identified, though evaluation for free air is limited on a single supine view. Stool is noted within the ascending colon. The visualized osseous structures are within normal limits; the sacroiliac joints are unremarkable in appearance. The visualized lung bases are essentially  clear. The patient's enteric tube is noted ending overlying the fundus of the stomach, with the side port about the distal esophagus. IMPRESSION: Persistent dilatation of small-bowel loops to 4.9 cm in diameter, concerning for relatively high-grade small bowel obstruction. Contrast remains within the small bowel. No free intra-abdominal air seen. Electronically Signed   By: Roanna Raider M.D.   On: 05/20/2016 20:51   Dg Abd Portable 1v  Result Date: 05/20/2016 CLINICAL DATA:  NG tube placement, history of small-bowel obstruction EXAM: PORTABLE ABDOMEN - 1 VIEW COMPARISON:  KUB of 05/19/2016 and CT abdomen and pelvis of 05/15/2016 FINDINGS: The tip of the NG tube overlies the distal esophagus and needs to be advanced by approximately 10 to 15 cm into the stomach. There is persistent dilatation of small bowel consistent with small bowel obstruction. The stomach appears fluid filled. IMPRESSION: NG tube tip within the distal esophagus and needs to be advanced at least 10 cm. Little change in small bowel obstructive pattern. Electronically Signed   By: Dwyane Dee M.D.   On: 05/20/2016 12:44   Dg Abd Portable 1v-small Bowel Obstruction Protocol-initial, 8 Hr Delay  Result Date: 05/19/2016 CLINICAL DATA:  Small bowel obstruction. EXAM: PORTABLE ABDOMEN - 1 VIEW COMPARISON:  Earlier the same day. FINDINGS: AP supine portable study at 2031 hours shows contrast material in the stomach and proximal small bowel. Small bowel loops remain dilated with gas filled with left abdominal loop measuring up to 5.4 cm diameter. No evidence for contrast material in the colon. IMPRESSION: Imaging features remain consistent with  small bowel obstruction. Electronically Signed   By: Kennith Center M.D.   On: 05/19/2016 22:28    Anti-infectives: Anti-infectives    Start     Dose/Rate Route Frequency Ordered Stop   05/20/16 2100  metroNIDAZOLE (FLAGYL) IVPB 500 mg     500 mg 100 mL/hr over 60 Minutes Intravenous Every 8 hours  05/20/16 2048     05/18/16 1730  ciprofloxacin (CIPRO) IVPB 400 mg     400 mg 200 mL/hr over 60 Minutes Intravenous Every 12 hours 05/18/16 1721     05/18/16 0800  ciprofloxacin (CIPRO) tablet 500 mg  Status:  Discontinued     500 mg Oral 2 times daily 05/18/16 0749 05/18/16 1721   05/18/16 0800  metroNIDAZOLE (FLAGYL) tablet 500 mg  Status:  Discontinued     500 mg Oral Every 8 hours 05/18/16 0749 05/20/16 2048      Assessment/Plan: SBO - umimproved SBO as per RADS report -To OR for Dx lap, possibel ex lap and LOA, possible bowel resection -All risks and benefits were discussed with the patient, to generally include infection, bleeding, damage to surrounding structures, leak, and possible recurrence. Alternatives were offered and described.  All questions were answered and the patient voiced understanding of the procedure and wishes to proceed at this point.     LOS: 5 days    Marigene Ehlers., Jed Limerick 05/21/2016

## 2016-05-21 NOTE — Transfer of Care (Signed)
Immediate Anesthesia Transfer of Care Note  Patient: [name] [name]  Procedure(s) Performed: Procedure(s): LAPAROSCOPY DIAGNOSTIC, LYSIS OF ADHESION BAND (N/A)  Patient Location: PACU  Anesthesia Type:General  Level of Consciousness: awake, alert , oriented and patient cooperative  Airway & Oxygen Therapy: Patient Spontanous Breathing and Patient connected to nasal cannula oxygen  Post-op Assessment: Report given to RN, Post -op Vital signs reviewed and stable and Patient moving all extremities  Post vital signs: Reviewed and stable  Last Vitals:  Vitals:   05/21/16 0406 05/21/16 0829  BP: (!) 144/76 134/64  Pulse: 91 89  Resp: 19 18  Temp: 36.8 C 36.8 C    Last Pain:  Vitals:   05/21/16 0829  TempSrc: Oral  PainSc:       Patients Stated Pain Goal: 2 (05/21/16 0800)  Complications: No apparent anesthesia complications

## 2016-05-21 NOTE — Progress Notes (Signed)
Dr Derrell Lolling states he know NG tube is in correct postion from OR and no Xray is needed

## 2016-05-21 NOTE — Anesthesia Procedure Notes (Signed)
Procedure Name: Intubation Date/Time: 05/21/2016 11:20 AM Performed by: Myna Bright Pre-anesthesia Checklist: Patient identified, Emergency Drugs available, Suction available and Patient being monitored Patient Re-evaluated:Patient Re-evaluated prior to inductionOxygen Delivery Method: Circle system utilized Preoxygenation: Pre-oxygenation with 100% oxygen Intubation Type: IV induction, Rapid sequence and Cricoid Pressure applied Laryngoscope Size: Mac and 3 Grade View: Grade I Tube type: Oral Tube size: 7.0 mm Number of attempts: 1 Airway Equipment and Method: Stylet Placement Confirmation: ETT inserted through vocal cords under direct vision,  positive ETCO2 and breath sounds checked- equal and bilateral Secured at: 20 cm Tube secured with: Tape Dental Injury: Teeth and Oropharynx as per pre-operative assessment

## 2016-05-21 NOTE — Op Note (Signed)
05/21/2016  12:17 PM  PATIENT:  [name] [name]  76 y.o. female  PRE-OPERATIVE DIAGNOSIS:  SBO  POST-OPERATIVE DIAGNOSIS:  Adhesive band  PROCEDURE:  Procedure(s): LAPAROSCOPY DIAGNOSTIC, .LYSIS OF ADHESIVE BAND (N/A)  SURGEON:  Surgeon(s) and Role:    * Axel Filler, MD - Primary  PHYSICIAN ASSISTANT: Bailey Mech, PA-C  ASSISTANTS: none   ANESTHESIA:   local and general  EBL:<5cc  Total I/O In: 715 [IV Piggyback:715] Out: 200 [Urine:200]  BLOOD ADMINISTERED:none  DRAINS: none   LOCAL MEDICATIONS USED:  BUPIVICAINE   SPECIMEN:  No Specimen  DISPOSITION OF SPECIMEN:  N/A  COUNTS:  YES  TOURNIQUET:  * No tourniquets in log *  DICTATION: .Dragon Dictation  Indication for procedure: Patient is a 76 year old female who was admitted with what appeared to be a bowel obstruction. Patient underwent our small bowel obstruction protocol. This appeared to be negative for any contrast mobilizing the colon. Secondary to this we proceeded with consented the patient for a diagnostic laparoscopy possible exploratory laparoscopy.   Details of the procedure:  After the patient was consented she was taken back to the or and placed in the supine position with bilateral SCDs in place. Patient underwent general endotracheal anesthesia. Patient was prepped and draped in the standard fashion. Timeout was called all facts verified.  A Veress needle technique was used to insufflate the abdomen to 15 mmHg in the right lower quadrant. Subsequent to this a family retrograde, placed intra-abdominally. There is no injury to any intra-abdominal organs. A final trocar is placed the suprapubic region as well as the right upper quadrant. This was done under direct visualization.  At this time proceeded to position the patient and mobilize the small bowel which was dilated proximally. I was able to visualize ligament of Treitz. I traced this distally. In the area of the mid jejunum that appeared to  be a internal hernia around a adhesive omental band. There appeared to be a natural defect in the omentum that the bowel was herniated into.   the small bowel was removed from the omental defect. The distal portion of the bowel was easily seen to be distended thereafter, an appropriately. Upon visualizing the area of the herniated herniated small bowel there was a small puncture just proximal to the entry into the hernia. Secondary to this a 3-0 silk was used to reapproximate the puncture in a Lembert fashion 2. This appeared to make the repair air proof.  At this time I proceeded to run the rest of the small bowel to the terminal ileum. There is no further obstruction seen. At this time the omentum was brought over the small bowel. Insufflation was evacuated. All trochars were removed. The skin was reapproximated using 4-0 Monocryl subcuticular fashion. The skin was dressed with Steri-Strips gauze and tape. The patient was taken to the PACU in stable condition.       PLAN OF CARE: Admit to inpatient   PATIENT DISPOSITION:  PACU - hemodynamically stable.   Delay start of Pharmacological VTE agent (>24hrs) due to surgical blood loss or risk of bleeding: not applicable

## 2016-05-21 NOTE — Progress Notes (Signed)
Patient returned from the PACU and is not having complaints of pain in abdomen.  Patient states she feels like she has to pee and the catheter is extremely uncomfortable and she wants it out.  Foley catheter discontinued.  Patient is a little confused at times, thinking she is at home, however she knows she just had surgery on her stomach.  Dressings are clean, dry, and intact. Will continue to monitor patient.

## 2016-05-22 ENCOUNTER — Encounter (HOSPITAL_COMMUNITY): Payer: Self-pay | Admitting: General Surgery

## 2016-05-22 ENCOUNTER — Inpatient Hospital Stay (HOSPITAL_COMMUNITY): Payer: PPO

## 2016-05-22 LAB — CBC
HCT: 33.7 % — ABNORMAL LOW (ref 36.0–46.0)
HEMOGLOBIN: 11.1 g/dL — AB (ref 12.0–15.0)
MCH: 29 pg (ref 26.0–34.0)
MCHC: 32.9 g/dL (ref 30.0–36.0)
MCV: 88 fL (ref 78.0–100.0)
Platelets: 194 10*3/uL (ref 150–400)
RBC: 3.83 MIL/uL — ABNORMAL LOW (ref 3.87–5.11)
RDW: 13.8 % (ref 11.5–15.5)
WBC: 9 10*3/uL (ref 4.0–10.5)

## 2016-05-22 LAB — BASIC METABOLIC PANEL
Anion gap: 4 — ABNORMAL LOW (ref 5–15)
BUN: 13 mg/dL (ref 6–20)
CALCIUM: 8.7 mg/dL — AB (ref 8.9–10.3)
CHLORIDE: 105 mmol/L (ref 101–111)
CO2: 28 mmol/L (ref 22–32)
CREATININE: 0.66 mg/dL (ref 0.44–1.00)
GFR calc Af Amer: >60 mL/min (ref >60)
GFR calc non Af Amer: >60 mL/min (ref >60)
Glucose, Bld: 124 mg/dL — ABNORMAL HIGH (ref 65–99)
Potassium: 2.9 mmol/L — ABNORMAL LOW (ref 3.5–5.1)
SODIUM: 137 mmol/L (ref 135–145)

## 2016-05-22 LAB — GLUCOSE, CAPILLARY: GLUCOSE-CAPILLARY: 97 mg/dL (ref 65–99)

## 2016-05-22 MED ORDER — MORPHINE SULFATE (PF) 4 MG/ML IV SOLN
1.0000 mg | INTRAVENOUS | Status: DC | PRN
  Administered 2016-05-22 – 2016-05-23 (×8): 3 mg via INTRAVENOUS
  Administered 2016-05-24 – 2016-05-25 (×3): 1 mg via INTRAVENOUS
  Filled 2016-05-22 (×11): qty 1

## 2016-05-22 MED ORDER — DEXTROSE-NACL 5-0.45 % IV SOLN
INTRAVENOUS | Status: DC
  Administered 2016-05-22: 09:00:00 via INTRAVENOUS
  Filled 2016-05-22 (×6): qty 1000

## 2016-05-22 NOTE — Progress Notes (Signed)
Progress Note: General Surgery Service   Subjective: Pain in abdomen and NG uncomfortable  Objective: Vital signs in last 24 hours: Temp:  [97 F (36.1 C)-98.2 F (36.8 C)] 98 F (36.7 C) (01/27 0546) Pulse Rate:  [87-99] 92 (01/27 0546) Resp:  [14-22] 19 (01/27 0546) BP: (98-137)/(44-75) 137/44 (01/27 0546) SpO2:  [97 %-100 %] 97 % (01/27 0546) Weight:  [54.5 kg (120 lb 3.2 oz)] 54.5 kg (120 lb 3.2 oz) (01/27 0546) Last BM Date: 05/20/16  Intake/Output from previous day: 01/26 0701 - 01/27 0700 In: 3126.7 [I.V.:1711.7; IV Piggyback:1415] Out: 900 [Urine:850; Blood:50] Intake/Output this shift: No intake/output data recorded.  Lungs: CTAb  Cardiovascular: RRR  Abd: soft, ATTP, incisions bandaged without seep through  Extremities: no edema  Neuro: AOx3  Lab Results: CBC   Recent Labs  05/21/16 0352 05/22/16 0433  WBC 10.8* 9.0  HGB 11.8* 11.1*  HCT 35.9* 33.7*  PLT 214 194   BMET  Recent Labs  05/21/16 0352 05/22/16 0433  NA 139 137  K 3.0* 2.9*  CL 102 105  CO2 30 28  GLUCOSE 106* 124*  BUN 13 13  CREATININE 0.76 0.66  CALCIUM 9.5 8.7*   PT/INR No results for input(s): LABPROT, INR in the last 72 hours. ABG No results for input(s): PHART, HCO3 in the last 72 hours.  Invalid input(s): PCO2, PO2  Studies/Results:  Anti-infectives: Anti-infectives    Start     Dose/Rate Route Frequency Ordered Stop   05/21/16 1800  ciprofloxacin (CIPRO) IVPB 400 mg     400 mg 200 mL/hr over 60 Minutes Intravenous Every 12 hours 05/21/16 1355     05/20/16 2100  metroNIDAZOLE (FLAGYL) IVPB 500 mg     500 mg 100 mL/hr over 60 Minutes Intravenous Every 8 hours 05/20/16 2048     05/18/16 1730  ciprofloxacin (CIPRO) IVPB 400 mg  Status:  Discontinued     400 mg 200 mL/hr over 60 Minutes Intravenous Every 12 hours 05/18/16 1721 05/21/16 1355   05/18/16 0800  ciprofloxacin (CIPRO) tablet 500 mg  Status:  Discontinued     500 mg Oral 2 times daily 05/18/16  0749 05/18/16 1721   05/18/16 0800  metroNIDAZOLE (FLAGYL) tablet 500 mg  Status:  Discontinued     500 mg Oral Every 8 hours 05/18/16 0749 05/20/16 2048      Medications: Scheduled Meds: . ciprofloxacin  400 mg Intravenous Q12H  . enoxaparin (LOVENOX) injection  40 mg Subcutaneous Q24H  . famotidine (PEPCID) IV  20 mg Intravenous Q12H  . feeding supplement  1 Container Oral BID BM  . mouth rinse  15 mL Mouth Rinse BID  . metronidazole  500 mg Intravenous Q8H  . pantoprazole (PROTONIX) IV  40 mg Intravenous Q12H  . sodium chloride flush  3 mL Intravenous Q12H   Continuous Infusions: . dextrose 5 % and 0.45% NaCl 1,000 mL with potassium chloride 80 mEq infusion    . lactated ringers     PRN Meds:.acetaminophen, fluticasone, LORazepam, morphine injection, ondansetron (ZOFRAN) IV, phenol, traMADol, zolpidem  Assessment/Plan: Patient Active Problem List   Diagnosis Date Noted  . Malnutrition of moderate degree 05/17/2016  . Bigeminy   . Intractable vomiting 05/15/2016  . Heart palpitations 05/15/2016  . SBO (small bowel obstruction) 05/15/2016  . Acute coronary syndrome (HCC) 07/15/2015  . Status post Maze operation for atrial fibrillation 01/08/2013  . S/P MVR (mitral valve repair) 01/08/2013  . Atrial flutter (HCC) 10/30/2012  . Amaurosis fugax 10/30/2012  .  Dyspnea 10/28/2012  . 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 Thyroid goiter 08/21/2012  . Microscopic hematuria 08/20/2012  . S/P mitral valve repair 06/28/2012  . History of mitral valve prolapse with severe mitral regurgtation 05/19/2012  . Refusal of blood transfusions as patient is Jehov[employer]'s Witness 05/19/2012  . Atrial fibrillation (HCC) 05/16/2012  . Anxiety 04/27/2011  . Hypertension 04/26/2011  . Diverticulitis 04/24/2011  . Hyperlipidemia    s/p Procedure(s): LAPAROSCOPY DIAGNOSTIC, LYSIS OF ADHESION BAND 05/21/2016 -continue NG tube -NPo -pain control     LOS: 6 days   Rodman Pickle, MD Pg# [phone] [facility]

## 2016-05-22 NOTE — Progress Notes (Addendum)
Pt is complaining headache and pain on surgical site all the time. IV Morphine is given around the clock, IV fluid continue with KCL, NG tube suction is continue at LIS, will continue to monitor the patient. Lonia Farber, RN

## 2016-05-22 NOTE — Progress Notes (Signed)
During pt's rounding at 6.30pm, RN found that pt almost pulled her NG tube, RN reinserted NG tube and right now waiting for the X-ray to confirm the placement. NG tube output 225 cc since am (450cc in 24 hours), will continue to monitor  Lonia Farber, RN

## 2016-05-22 NOTE — Progress Notes (Signed)
PROGRESS NOTE    [name] [name]  ZOX:[id number] DOB: Nov 22, 1940 DOA: 05/15/2016 PCP: Minda Meo, MD   Chief Complaint  Patient presents with  . Palpitations    Brief Narrative:  HPI on 05/15/2016 by Dr. Marcial Pacas Opyd [name] [name] is a 76 y.o. female with medical history significant for hypertension, coronary artery disease, GERD, hyperthyroidism, and mitral valve prolapse status post MVR who presents to the emergency department with one-day vomiting and palpitations. Patient reports that she had been in her usual state of health until shortly after breakfast when she developed acute nausea with nonbloody nonbilious vomiting. These symptoms persisted throughout the morning and afternoon and she developed palpitations. She reports a history of anxiety and has been very concerned with the palpitations, prompting her presentation to the ED. She reports a normal bowel movement the day prior. She reports mid-abdominal pain, described as cramping, moderate in intensity, localized to the mid-abdomen, and without associated fevers, chills, or diarrhea. Patient had eaten organic eggs for breakfast and suspects that has causing her symptoms. She has not been able to tolerate anything by mouth since the vomiting developed. She tried taking the Protonix at home, but quickly vomited and then felt as though it made things worse. She denies similar symptoms previously.   A CT of the abdomen and pelvis was obtained and demonstrates a small bowel obstruction with transition point in the mid abdomen, with findings suggestive of an internal hernia that site. Admitted with SBO, surgery consulted and helping with management.   Assessment & Plan   Small bowel obstruction -CT abd/pelvis demonstrates SBO with findings suggestive of internal hernia. Transition point.  -Continue with IV fluids, pain control -General surgery consulted and appreciated -KUB showed SBO pattern, stool burden -Gastrograffin  given. KUB continued to show SBO -Continue cipro/flagyl -NG tube reinserted on 05/20/2016. -Successful bowel movement on 05/20/2016 with enema -Repeat xray on 1/25 showed persistent SBO -S/p exploratory laparoscopy with lysis of adhesion band on 05/21/16  Palpitations/ Frequents PVC.  -Improved after IV fluids. -Magnesium replaced. Continue to replace potassium -Coreg held due to NPO status and surgery  MVR, 2014 -Echocardiogram in 2017: Mitral valve: An annular ring prosthesis was present. Mobility was restricted. Transvalvular velocity was increased, due to stenosis. The findings are consistent with moderate stenosis. -Dr. Jacinto Halim aware of patient's admission.   Essential hypertension -BP stable, Coreg held  Anxiety -Continue ativan PRN    Hypokalemia/Hypomagnesemia -Replacing. Continue to monitor  DVT Prophylaxis  lovenox  Code Status: Full  Family Communication: None at bedside  Disposition Plan: Admitted.   Consultants Cardiology General surgery  Procedures  Exploratory laparoscopy with lysis of adhesion band  Antibiotics   Anti-infectives    Start     Dose/Rate Route Frequency Ordered Stop   05/21/16 1800  ciprofloxacin (CIPRO) IVPB 400 mg     400 mg 200 mL/hr over 60 Minutes Intravenous Every 12 hours 05/21/16 1355     05/20/16 2100  metroNIDAZOLE (FLAGYL) IVPB 500 mg     500 mg 100 mL/hr over 60 Minutes Intravenous Every 8 hours 05/20/16 2048     05/18/16 1730  ciprofloxacin (CIPRO) IVPB 400 mg  Status:  Discontinued     400 mg 200 mL/hr over 60 Minutes Intravenous Every 12 hours 05/18/16 1721 05/21/16 1355   05/18/16 0800  ciprofloxacin (CIPRO) tablet 500 mg  Status:  Discontinued     500 mg Oral 2 times daily 05/18/16 0749 05/18/16 1721   05/18/16 0800  metroNIDAZOLE (FLAGYL) tablet  500 mg  Status:  Discontinued     500 mg Oral Every 8 hours 05/18/16 0749 05/20/16 2048      Subjective:   [name] [name] seen and examined today.  Patient  complains of feeling hungry and feels she is going to starve to death. Denies nausea or vomiting. Has abdominal pain. Denies chest pain, shortness of breath, dizziness.  Objective:   Vitals:   05/21/16 1317 05/21/16 1443 05/22/16 0546 05/22/16 1015  BP:  98/63 (!) 137/44 135/65  Pulse: 90 99 92 93  Resp: 16 18 19 20   Temp: 97 F (36.1 C) 98.2 F (36.8 C) 98 F (36.7 C) 98.4 F (36.9 C)  TempSrc:  Oral Oral Oral  SpO2: 100% 99% 97% 99%  Weight:   54.5 kg (120 lb 3.2 oz)   Height:        Intake/Output Summary (Last 24 hours) at 05/22/16 1116 Last data filed at 05/22/16 0857  Gross per 24 hour  Intake          2651.67 ml  Output             1425 ml  Net          1226.67 ml   Filed Weights   05/20/16 0349 05/21/16 0406 05/22/16 0546  Weight: 51.7 kg (113 lb 14.4 oz) 51 kg (112 lb 6.4 oz) 54.5 kg (120 lb 3.2 oz)    Exam  General: Well developed, well nourished, NAD, appears stated age  HEENT: NCAT, mucous membranes moist. NG tube in place  Cardiovascular: S1 S2 auscultated, no murmurs, RRR  Respiratory: Clear to auscultation  Abdomen: Soft, generalized TTP, nondistended, bandage in place  Extremities: warm dry without cyanosis clubbing or edema  Neuro: AAOx3, nonfocal  Psych: Anxious    Data Reviewed: I have personally reviewed following labs and imaging studies  CBC:  Recent Labs Lab 05/15/16 1713 05/17/16 0459 05/19/16 0528 05/20/16 0413 05/21/16 0352 05/22/16 0433  WBC 10.2 10.1 8.7 11.0* 10.8* 9.0  NEUTROABS 7.8*  --   --   --   --   --   HGB 12.8 12.1 12.0 12.8 11.8* 11.1*  HCT 38.6 37.2 35.6* 37.9 35.9* 33.7*  MCV 88.3 87.9 86.2 86.9 88.0 88.0  PLT 209 222 207 239 214 194   Basic Metabolic Panel:  Recent Labs Lab 05/15/16 1713  05/17/16 0459 05/18/16 0342 05/19/16 0528 05/20/16 0413 05/21/16 0352 05/22/16 0433  NA 137  < > 139 135 135 137 139 137  K 3.8  < > 3.5 2.9* 3.1* 3.0* 3.0* 2.9*  CL 103  < > 108 103 101 101 102 105  CO2 25   < > 22 24 26 27 30 28   GLUCOSE 99  < > 159* 142* 172* 163* 106* 124*  BUN 11  < > 7 <5* 7 11 13 13   CREATININE 0.76  < > 0.70 0.68 0.62 0.66 0.76 0.66  CALCIUM 10.1  < > 9.0 9.2 9.5 9.8 9.5 8.7*  MG 1.8  --  1.6* 1.9  --  1.7 2.2  --   < > = values in this interval not displayed. GFR: Estimated Creatinine Clearance: 53.1 mL/min (by C-G formula based on SCr of 0.66 mg/dL). Liver Function Tests:  Recent Labs Lab 05/15/16 1713  AST 24  ALT 21  ALKPHOS 72  BILITOT 0.7  PROT 6.6  ALBUMIN 4.0    Recent Labs Lab 05/15/16 1713  LIPASE 24   No results for input(s): AMMONIA  in the last 168 hours. Coagulation Profile: No results for input(s): INR, PROTIME in the last 168 hours. Cardiac Enzymes:  Recent Labs Lab 05/16/16 0139 05/16/16 0711  TROPONINI <0.03 <0.03   BNP (last 3 results) No results for input(s): PROBNP in the last 8760 hours. HbA1C: No results for input(s): HGBA1C in the last 72 hours. CBG:  Recent Labs Lab 05/18/16 0636 05/19/16 0608 05/20/16 0600 05/21/16 0532 05/22/16 0557  GLUCAP 133* 170* 147* 105* 97   Lipid Profile: No results for input(s): CHOL, HDL, LDLCALC, TRIG, CHOLHDL, LDLDIRECT in the last 72 hours. Thyroid Function Tests: No results for input(s): TSH, T4TOTAL, FREET4, T3FREE, THYROIDAB in the last 72 hours. Anemia Panel: No results for input(s): VITAMINB12, FOLATE, FERRITIN, TIBC, IRON, RETICCTPCT in the last 72 hours. Urine analysis:    Component Value Date/Time   COLORURINE YELLOW 05/15/2016 2146   APPEARANCEUR CLEAR 05/15/2016 2146   LABSPEC 1.015 05/15/2016 2146   PHURINE 7.0 05/15/2016 2146   GLUCOSEU NEGATIVE 05/15/2016 2146   HGBUR MODERATE (A) 05/15/2016 2146   BILIRUBINUR NEGATIVE 05/15/2016 2146   BILIRUBINUR negative 11/02/2014 1336   KETONESUR 15 (A) 05/15/2016 2146   PROTEINUR NEGATIVE 05/15/2016 2146   UROBILINOGEN 0.2 11/02/2014 1336   UROBILINOGEN 0.2 02/10/2013 1615   NITRITE NEGATIVE 05/15/2016 2146    LEUKOCYTESUR NEGATIVE 05/15/2016 2146   Sepsis Labs: @LABRCNTIP (procalcitonin:4,lacticidven:4)  ) Recent Results (from the past 240 hour(s))  Surgical pcr screen     Status: None   Collection Time: 05/21/16 10:12 AM  Result Value Ref Range Status   MRSA, PCR NEGATIVE NEGATIVE Final   Staphylococcus aureus NEGATIVE NEGATIVE Final    Comment:        The Xpert SA Assay (FDA approved for NASAL specimens in patients over 54 years of age), is one component of a comprehensive surveillance program.  Test performance has been validated by [facility] for patients greater than or equal to 66 year old. It is not intended to diagnose infection nor to guide or monitor treatment.       Radiology Studies: Dg Abd Portable 1v  Result Date: 05/20/2016 CLINICAL DATA:  Follow-up small bowel obstruction. Initial encounter. EXAM: PORTABLE ABDOMEN - 1 VIEW COMPARISON:  Abdominal radiograph performed 05/20/2016 FINDINGS: There is persistent dilatation of small-bowel loops to 4.9 cm in diameter, concerning for relatively high-grade small bowel obstruction. Contrast is seen within the small bowel. No free intra-abdominal air is identified, though evaluation for free air is limited on a single supine view. Stool is noted within the ascending colon. The visualized osseous structures are within normal limits; the sacroiliac joints are unremarkable in appearance. The visualized lung bases are essentially clear. The patient's enteric tube is noted ending overlying the fundus of the stomach, with the side port about the distal esophagus. IMPRESSION: Persistent dilatation of small-bowel loops to 4.9 cm in diameter, concerning for relatively high-grade small bowel obstruction. Contrast remains within the small bowel. No free intra-abdominal air seen. Electronically Signed   By: Roanna Raider M.D.   On: 05/20/2016 20:51   Dg Abd Portable 1v  Result Date: 05/20/2016 CLINICAL DATA:  NG tube placement, history of  small-bowel obstruction EXAM: PORTABLE ABDOMEN - 1 VIEW COMPARISON:  KUB of 05/19/2016 and CT abdomen and pelvis of 05/15/2016 FINDINGS: The tip of the NG tube overlies the distal esophagus and needs to be advanced by approximately 10 to 15 cm into the stomach. There is persistent dilatation of small bowel consistent with small bowel obstruction. The stomach  appears fluid filled. IMPRESSION: NG tube tip within the distal esophagus and needs to be advanced at least 10 cm. Little change in small bowel obstructive pattern. Electronically Signed   By: Dwyane Dee M.D.   On: 05/20/2016 12:44     Scheduled Meds: . ciprofloxacin  400 mg Intravenous Q12H  . enoxaparin (LOVENOX) injection  40 mg Subcutaneous Q24H  . famotidine (PEPCID) IV  20 mg Intravenous Q12H  . feeding supplement  1 Container Oral BID BM  . mouth rinse  15 mL Mouth Rinse BID  . metronidazole  500 mg Intravenous Q8H  . pantoprazole (PROTONIX) IV  40 mg Intravenous Q12H  . sodium chloride flush  3 mL Intravenous Q12H   Continuous Infusions: . dextrose 5 % and 0.45% NaCl 1,000 mL with potassium chloride 80 mEq infusion 100 mL/hr at 05/22/16 0906  . lactated ringers       LOS: 6 days   Time Spent in minutes   30 minutes  Rajiv Parlato D.O. on 05/22/2016 at 11:16 AM  Between 7am to 7pm - Pager - [phone]  After 7pm go to [email or web address] - password [email or web address]  And look for the night coverage person covering for me after hours  [facility] Office  [phone]

## 2016-05-23 LAB — CBC
HEMATOCRIT: 32.9 % — AB (ref 36.0–46.0)
Hemoglobin: 10.8 g/dL — ABNORMAL LOW (ref 12.0–15.0)
MCH: 29 pg (ref 26.0–34.0)
MCHC: 32.8 g/dL (ref 30.0–36.0)
MCV: 88.4 fL (ref 78.0–100.0)
PLATELETS: 207 10*3/uL (ref 150–400)
RBC: 3.72 MIL/uL — AB (ref 3.87–5.11)
RDW: 13.9 % (ref 11.5–15.5)
WBC: 12 10*3/uL — AB (ref 4.0–10.5)

## 2016-05-23 LAB — BASIC METABOLIC PANEL
ANION GAP: 5 (ref 5–15)
BUN: 9 mg/dL (ref 6–20)
CALCIUM: 8.8 mg/dL — AB (ref 8.9–10.3)
CO2: 25 mmol/L (ref 22–32)
Chloride: 104 mmol/L (ref 101–111)
Creatinine, Ser: 0.61 mg/dL (ref 0.44–1.00)
GLUCOSE: 150 mg/dL — AB (ref 65–99)
POTASSIUM: 4.1 mmol/L (ref 3.5–5.1)
Sodium: 134 mmol/L — ABNORMAL LOW (ref 135–145)

## 2016-05-23 LAB — GLUCOSE, CAPILLARY: GLUCOSE-CAPILLARY: 152 mg/dL — AB (ref 65–99)

## 2016-05-23 LAB — MAGNESIUM: Magnesium: 1.6 mg/dL — ABNORMAL LOW (ref 1.7–2.4)

## 2016-05-23 MED ORDER — DEXTROSE-NACL 5-0.45 % IV SOLN
INTRAVENOUS | Status: DC
  Filled 2016-05-23: qty 1000

## 2016-05-23 MED ORDER — DEXTROSE-NACL 5-0.45 % IV SOLN
INTRAVENOUS | Status: DC
  Administered 2016-05-23 (×2): via INTRAVENOUS

## 2016-05-23 NOTE — Progress Notes (Signed)
Pt is alert and oriented tearful in pain, 1 assist to the bathroom. Gave pain medication morphine IV.

## 2016-05-23 NOTE — Progress Notes (Signed)
Pt"s NG tube taken out this Am in surgery presence, sips given throughout the day, pt had 2 episodes of BM so far, no nausea and vomiting presence, IV fluid continue, will continue to monitor.

## 2016-05-23 NOTE — Progress Notes (Signed)
Patient experiencing Nausea and restlessness gave PRN Relief was a immediate.

## 2016-05-23 NOTE — Progress Notes (Signed)
Abd x-ray was performed to evaluate placement of NG tube. Called and spoke with Radiologist to clarify placement & he Verified that it  was in place, but to advance 3 CM, which was done by this Clinical research associate. Also checked for placement by advancing approx 5 CC's of air and heard on auscultation.. NG was then placed back to low intermitten wall suction. Finally primary was notified via text. No deviations noted.

## 2016-05-23 NOTE — Progress Notes (Signed)
PROGRESS NOTE    ALIAHA EDISON  WLN:[id number] DOB: 02-16-1941 DOA: 05/15/2016 PCP: Minda Meo, MD   Chief Complaint  Patient presents with  . Palpitations    Brief Narrative:  HPI on 05/15/2016 by Dr. Marcial Pacas Opyd NUSAYBAH SEASTRAND is a 76 y.o. female with medical history significant for hypertension, coronary artery disease, GERD, hyperthyroidism, and mitral valve prolapse status post MVR who presents to the emergency department with one-day vomiting and palpitations. Patient reports that she had been in her usual state of health until shortly after breakfast when she developed acute nausea with nonbloody nonbilious vomiting. These symptoms persisted throughout the morning and afternoon and she developed palpitations. She reports a history of anxiety and has been very concerned with the palpitations, prompting her presentation to the ED. She reports a normal bowel movement the day prior. She reports mid-abdominal pain, described as cramping, moderate in intensity, localized to the mid-abdomen, and without associated fevers, chills, or diarrhea. Patient had eaten organic eggs for breakfast and suspects that has causing her symptoms. She has not been able to tolerate anything by mouth since the vomiting developed. She tried taking the Protonix at home, but quickly vomited and then felt as though it made things worse. She denies similar symptoms previously.   A CT of the abdomen and pelvis was obtained and demonstrates a small bowel obstruction with transition point in the mid abdomen, with findings suggestive of an internal hernia that site. Admitted with SBO, surgery consulted and helping with management.   Assessment & Plan   Small bowel obstruction -CT abd/pelvis demonstrates SBO with findings suggestive of internal hernia. Transition point.  -Continue with IV fluids, pain control -General surgery consulted and appreciated -KUB showed SBO pattern, stool burden -Gastrograffin  given. KUB continued to show SBO -Continue cipro/flagyl -NG tube reinserted on 05/20/2016. NGTube removed by patient again. -Successful bowel movement on 05/20/2016 with enema -Repeat xray on 1/25 showed persistent SBO -S/p exploratory laparoscopy with lysis of adhesion band on 05/21/16  Palpitations/ Frequents PVC.  -Improved after IV fluids. -Magnesium replaced. Continue to replace potassium -Coreg held due to NPO status and surgery  MVR, 2014 -Echocardiogram in 2017: Mitral valve: An annular ring prosthesis was present. Mobility was restricted. Transvalvular velocity was increased, due to stenosis. The findings are consistent with moderate stenosis. -Dr. Jacinto Halim aware of patient's admission.   Essential hypertension -BP stable, Coreg held  Anxiety -Continue ativan PRN    Hypokalemia/Hypomagnesemia -Replacing. Continue to monitor  DVT Prophylaxis  lovenox  Code Status: Full  Family Communication: None at bedside  Disposition Plan: Admitted.   Consultants Cardiology General surgery  Procedures  Exploratory laparoscopy with lysis of adhesion band  Antibiotics   Anti-infectives    Start     Dose/Rate Route Frequency Ordered Stop   05/21/16 1800  ciprofloxacin (CIPRO) IVPB 400 mg     400 mg 200 mL/hr over 60 Minutes Intravenous Every 12 hours 05/21/16 1355     05/20/16 2100  metroNIDAZOLE (FLAGYL) IVPB 500 mg     500 mg 100 mL/hr over 60 Minutes Intravenous Every 8 hours 05/20/16 2048     05/18/16 1730  ciprofloxacin (CIPRO) IVPB 400 mg  Status:  Discontinued     400 mg 200 mL/hr over 60 Minutes Intravenous Every 12 hours 05/18/16 1721 05/21/16 1355   05/18/16 0800  ciprofloxacin (CIPRO) tablet 500 mg  Status:  Discontinued     500 mg Oral 2 times daily 05/18/16 0749 05/18/16 1721   05/18/16  0800  metroNIDAZOLE (FLAGYL) tablet 500 mg  Status:  Discontinued     500 mg Oral Every 8 hours 05/18/16 0749 05/20/16 2048      Subjective:   Kiyo Heal seen and  examined today.  Patient complains of wanting to go to the bathroom and wanting to eat. Currently, denies nausea or vomiting. Denies chest pain, shortness of breath, dizziness.  Per RN, patient has been removing her NG Tube several times.   Objective:   Vitals:   05/22/16 2015 05/23/16 0422 05/23/16 0607 05/23/16 0808  BP: 138/66  (!) 151/61 137/86  Pulse: 100 (!) 101 (!) 104 (!) 108  Resp: 18  20 20   Temp: 98.2 F (36.8 C)  98 F (36.7 C) 97.6 F (36.4 C)  TempSrc: Oral  Oral Oral  SpO2: 94% 94% 96% 90%  Weight:   58.5 kg (129 lb)   Height:        Intake/Output Summary (Last 24 hours) at 05/23/16 1130 Last data filed at 05/23/16 1125  Gross per 24 hour  Intake             1680 ml  Output             1775 ml  Net              -95 ml   Filed Weights   05/21/16 0406 05/22/16 0546 05/23/16 0607  Weight: 51 kg (112 lb 6.4 oz) 54.5 kg (120 lb 3.2 oz) 58.5 kg (129 lb)    Exam  General: Well developed, well nourished, NAD  HEENT: NCAT, mucous membranes moist.  Cardiovascular: S1 S2 auscultated, no murmurs, RRR  Respiratory: Clear to auscultation  Abdomen: Soft, generalized TTP, nondistended, few BS, bandage in place  Extremities: warm dry without cyanosis clubbing or edema  Neuro: AAOx3, nonfocal  Psych: Anxious, no insight into her medical issues   Data Reviewed: I have personally reviewed following labs and imaging studies  CBC:  Recent Labs Lab 05/19/16 0528 05/20/16 0413 05/21/16 0352 05/22/16 0433 05/23/16 0429  WBC 8.7 11.0* 10.8* 9.0 12.0*  HGB 12.0 12.8 11.8* 11.1* 10.8*  HCT 35.6* 37.9 35.9* 33.7* 32.9*  MCV 86.2 86.9 88.0 88.0 88.4  PLT 207 239 214 194 207   Basic Metabolic Panel:  Recent Labs Lab 05/17/16 0459 05/18/16 0342 05/19/16 0528 05/20/16 0413 05/21/16 0352 05/22/16 0433 05/23/16 0429  NA 139 135 135 137 139 137 134*  K 3.5 2.9* 3.1* 3.0* 3.0* 2.9* 4.1  CL 108 103 101 101 102 105 104  CO2 22 24 26 27 30 28 25   GLUCOSE  159* 142* 172* 163* 106* 124* 150*  BUN 7 <5* 7 11 13 13 9   CREATININE 0.70 0.68 0.62 0.66 0.76 0.66 0.61  CALCIUM 9.0 9.2 9.5 9.8 9.5 8.7* 8.8*  MG 1.6* 1.9  --  1.7 2.2  --  1.6*   GFR: Estimated Creatinine Clearance: 57 mL/min (by C-G formula based on SCr of 0.61 mg/dL). Liver Function Tests: No results for input(s): AST, ALT, ALKPHOS, BILITOT, PROT, ALBUMIN in the last 168 hours. No results for input(s): LIPASE, AMYLASE in the last 168 hours. No results for input(s): AMMONIA in the last 168 hours. Coagulation Profile: No results for input(s): INR, PROTIME in the last 168 hours. Cardiac Enzymes: No results for input(s): CKTOTAL, CKMB, CKMBINDEX, TROPONINI in the last 168 hours. BNP (last 3 results) No results for input(s): PROBNP in the last 8760 hours. HbA1C: No results for input(s): HGBA1C in  the last 72 hours. CBG:  Recent Labs Lab 05/19/16 0608 05/20/16 0600 05/21/16 0532 05/22/16 0557 05/23/16 0635  GLUCAP 170* 147* 105* 97 152*   Lipid Profile: No results for input(s): CHOL, HDL, LDLCALC, TRIG, CHOLHDL, LDLDIRECT in the last 72 hours. Thyroid Function Tests: No results for input(s): TSH, T4TOTAL, FREET4, T3FREE, THYROIDAB in the last 72 hours. Anemia Panel: No results for input(s): VITAMINB12, FOLATE, FERRITIN, TIBC, IRON, RETICCTPCT in the last 72 hours. Urine analysis:    Component Value Date/Time   COLORURINE YELLOW 05/15/2016 2146   APPEARANCEUR CLEAR 05/15/2016 2146   LABSPEC 1.015 05/15/2016 2146   PHURINE 7.0 05/15/2016 2146   GLUCOSEU NEGATIVE 05/15/2016 2146   HGBUR MODERATE (A) 05/15/2016 2146   BILIRUBINUR NEGATIVE 05/15/2016 2146   BILIRUBINUR negative 11/02/2014 1336   KETONESUR 15 (A) 05/15/2016 2146   PROTEINUR NEGATIVE 05/15/2016 2146   UROBILINOGEN 0.2 11/02/2014 1336   UROBILINOGEN 0.2 02/10/2013 1615   NITRITE NEGATIVE 05/15/2016 2146   LEUKOCYTESUR NEGATIVE 05/15/2016 2146   Sepsis  Labs: @LABRCNTIP (procalcitonin:4,lacticidven:4)  ) Recent Results (from the past 240 hour(s))  Surgical pcr screen     Status: None   Collection Time: 05/21/16 10:12 AM  Result Value Ref Range Status   MRSA, PCR NEGATIVE NEGATIVE Final   Staphylococcus aureus NEGATIVE NEGATIVE Final    Comment:        The Xpert SA Assay (FDA approved for NASAL specimens in patients over 81 years of age), is one component of a comprehensive surveillance program.  Test performance has been validated by [facility] for patients greater than or equal to 72 year old. It is not intended to diagnose infection nor to guide or monitor treatment.       Radiology Studies: Dg Abd Portable 1v  Result Date: 05/22/2016 CLINICAL DATA:  Initial evaluation for NG tube placement. EXAM: PORTABLE ABDOMEN - 1 VIEW COMPARISON:  Prior radiograph from 05/20/2016. FINDINGS: Tip of an enteric to overlies the gastric body, projecting inferiorly. Side hole partially visualized, likely just above the GE junction. Persistent dilated loops of gas-filled small bowel seen within the lower abdomen, measuring up to 4.9 cm, similar to previous. Retained enteric contrast material within the colon. IMPRESSION: 1. Tip of enteric tube overlying the gastric body, side hole likely just above the GE junction. 2. Dilated gas-filled loops of small bowel, similar to previous, and concerning for persistent small bowel obstruction. Enteric contrast material is now seen within the colon. Electronically Signed   By: Rise Mu M.D.   On: 05/22/2016 21:06     Scheduled Meds: . ciprofloxacin  400 mg Intravenous Q12H  . enoxaparin (LOVENOX) injection  40 mg Subcutaneous Q24H  . famotidine (PEPCID) IV  20 mg Intravenous Q12H  . feeding supplement  1 Container Oral BID BM  . mouth rinse  15 mL Mouth Rinse BID  . metronidazole  500 mg Intravenous Q8H  . pantoprazole (PROTONIX) IV  40 mg Intravenous Q12H  . sodium chloride flush  3 mL  Intravenous Q12H   Continuous Infusions: . dextrose 5 % and 0.45% NaCl 100 mL/hr at 05/23/16 0828  . lactated ringers       LOS: 7 days   Time Spent in minutes   30 minutes  Etta Gassett D.O. on 05/23/2016 at 11:30 AM  Between 7am to 7pm - Pager - [phone]  After 7pm go to [email or web address] - password [email or web address]  And look for the night coverage person covering for me after hours  [facility]  [facility] Office  [phone]

## 2016-05-23 NOTE — Progress Notes (Signed)
2 Days Post-Op  Subjective: Up on BSC, no BM, she was actively removing NGT despite bedside RN's best efforts  Objective: Vital signs in last 24 hours: Temp:  [97.6 F (36.4 C)-98.4 F (36.9 C)] 97.6 F (36.4 C) (01/28 0808) Pulse Rate:  [91-108] 108 (01/28 0808) Resp:  [18-20] 20 (01/28 0808) BP: (135-151)/(54-86) 137/86 (01/28 0808) SpO2:  [90 %-99 %] 90 % (01/28 0808) Weight:  [58.5 kg (129 lb)] 58.5 kg (129 lb) (01/28 0607) Last BM Date: 05/21/16  Intake/Output from previous day: 01/27 0701 - 01/28 0700 In: 1680 [P.O.:240; I.V.:590; IV Piggyback:850] Out: 1475 [Urine:1250; Emesis/NG output:225] Intake/Output this shift: No intake/output data recorded.  General appearance: mild distress Resp: clear to auscultation bilaterally Cardio: regular rate and rhythm GI: soft, dressings CDI, a few BS, incisional tenderness  Lab Results:   Recent Labs  05/22/16 0433 05/23/16 0429  WBC 9.0 12.0*  HGB 11.1* 10.8*  HCT 33.7* 32.9*  PLT 194 207   BMET  Recent Labs  05/22/16 0433 05/23/16 0429  NA 137 134*  K 2.9* 4.1  CL 105 104  CO2 28 25  GLUCOSE 124* 150*  BUN 13 9  CREATININE 0.66 0.61  CALCIUM 8.7* 8.8*   PT/INR No results for input(s): LABPROT, INR in the last 72 hours. ABG No results for input(s): PHART, HCO3 in the last 72 hours.  Invalid input(s): PCO2, PO2  Studies/Results: Dg Abd Portable 1v  Result Date: 05/22/2016 CLINICAL DATA:  Initial evaluation for NG tube placement. EXAM: PORTABLE ABDOMEN - 1 VIEW COMPARISON:  Prior radiograph from 05/20/2016. FINDINGS: Tip of an enteric to overlies the gastric body, projecting inferiorly. Side hole partially visualized, likely just above the GE junction. Persistent dilated loops of gas-filled small bowel seen within the lower abdomen, measuring up to 4.9 cm, similar to previous. Retained enteric contrast material within the colon. IMPRESSION: 1. Tip of enteric tube overlying the gastric body, side hole likely  just above the GE junction. 2. Dilated gas-filled loops of small bowel, similar to previous, and concerning for persistent small bowel obstruction. Enteric contrast material is now seen within the colon. Electronically Signed   By: Rise Mu M.D.   On: 05/22/2016 21:06    Anti-infectives: Anti-infectives    Start     Dose/Rate Route Frequency Ordered Stop   05/21/16 1800  ciprofloxacin (CIPRO) IVPB 400 mg     400 mg 200 mL/hr over 60 Minutes Intravenous Every 12 hours 05/21/16 1355     05/20/16 2100  metroNIDAZOLE (FLAGYL) IVPB 500 mg     500 mg 100 mL/hr over 60 Minutes Intravenous Every 8 hours 05/20/16 2048     05/18/16 1730  ciprofloxacin (CIPRO) IVPB 400 mg  Status:  Discontinued     400 mg 200 mL/hr over 60 Minutes Intravenous Every 12 hours 05/18/16 1721 05/21/16 1355   05/18/16 0800  ciprofloxacin (CIPRO) tablet 500 mg  Status:  Discontinued     500 mg Oral 2 times daily 05/18/16 0749 05/18/16 1721   05/18/16 0800  metroNIDAZOLE (FLAGYL) tablet 500 mg  Status:  Discontinued     500 mg Oral Every 8 hours 05/18/16 0749 05/20/16 2048      Assessment/Plan: s/p Procedure(s): LAPAROSCOPY DIAGNOSTIC, LYSIS OF ADHESION BAND (N/A) REPAIR ENTEROTOMY 1/26 Derrell Lolling)  Await bowel function Leave NGT out - she pulled 4x overnight Ice chips  LOS: 7 days    Kannon Baum E 05/23/2016

## 2016-05-24 LAB — BASIC METABOLIC PANEL
ANION GAP: 6 (ref 5–15)
BUN: 5 mg/dL — ABNORMAL LOW (ref 6–20)
CHLORIDE: 104 mmol/L (ref 101–111)
CO2: 27 mmol/L (ref 22–32)
CREATININE: 0.62 mg/dL (ref 0.44–1.00)
Calcium: 8.8 mg/dL — ABNORMAL LOW (ref 8.9–10.3)
GFR calc non Af Amer: >60 mL/min (ref >60)
Glucose, Bld: 116 mg/dL — ABNORMAL HIGH (ref 65–99)
POTASSIUM: 3.2 mmol/L — AB (ref 3.5–5.1)
Sodium: 137 mmol/L (ref 135–145)

## 2016-05-24 LAB — GLUCOSE, CAPILLARY: GLUCOSE-CAPILLARY: 135 mg/dL — AB (ref 65–99)

## 2016-05-24 LAB — CBC
HEMATOCRIT: 29.6 % — AB (ref 36.0–46.0)
HEMOGLOBIN: 9.7 g/dL — AB (ref 12.0–15.0)
MCH: 29 pg (ref 26.0–34.0)
MCHC: 32.8 g/dL (ref 30.0–36.0)
MCV: 88.6 fL (ref 78.0–100.0)
Platelets: 218 10*3/uL (ref 150–400)
RBC: 3.34 MIL/uL — AB (ref 3.87–5.11)
RDW: 13.7 % (ref 11.5–15.5)
WBC: 8 10*3/uL (ref 4.0–10.5)

## 2016-05-24 MED ORDER — POTASSIUM CHLORIDE 2 MEQ/ML IV SOLN
INTRAVENOUS | Status: DC
  Administered 2016-05-24 – 2016-05-25 (×4): via INTRAVENOUS
  Filled 2016-05-24 (×7): qty 1000

## 2016-05-24 MED ORDER — PRO-STAT SUGAR FREE PO LIQD
30.0000 mL | Freq: Every day | ORAL | Status: DC
  Administered 2016-05-24 – 2016-05-26 (×3): 30 mL via ORAL
  Filled 2016-05-24 (×3): qty 30

## 2016-05-24 NOTE — Care Management Important Message (Signed)
Important Message  Patient Details  Name: ANAHIS MININGER MRN: [id number] Date of Birth: 1940-10-19   Medicare Important Message Given:  Yes    Shalayne Leach [name] 05/24/2016, 1:43 PM

## 2016-05-24 NOTE — Progress Notes (Signed)
[facility] Progress Note  3 Days Post-Op  Subjective: Pt is having flatus and had a BM today. No vomiting overnight. Complaining of pain around her right shoulder blade if she lays on it a certain way. Pain not affected by movement. She states she has had this pain for days.   Objective: Vital signs in last 24 hours: Temp:  [97.8 F (36.6 C)-98.7 F (37.1 C)] 97.8 F (36.6 C) (01/29 0812) Pulse Rate:  [74-94] 82 (01/29 0812) Resp:  [18-20] 18 (01/29 0812) BP: (102-139)/(48-64) 124/57 (01/29 0812) SpO2:  [94 %-99 %] 99 % (01/29 0812) Weight:  [117 lb 4.8 oz (53.2 kg)] 117 lb 4.8 oz (53.2 kg) (01/29 0500) Last BM Date: 05/21/16  Intake/Output from previous day: 01/28 0701 - 01/29 0700 In: 1173.3 [P.O.:120; I.V.:653.3; IV Piggyback:400] Out: 2450 [Urine:2450] Intake/Output this shift: Total I/O In: 0  Out: 560 [Urine:560]  PE: Gen:  Alert, NAD, pleasant, cooperative, lying in bed Card:  RRR, no M/G/R heard Pulm:  Rate and effort normal Abd: Soft, nondistended, +BS, incisions C/D/I, mild generalized TTP worse around incisions Skin: no rashes noted, warm and dry  Lab Results:   Recent Labs  05/23/16 0429 05/24/16 0231  WBC 12.0* 8.0  HGB 10.8* 9.7*  HCT 32.9* 29.6*  PLT 207 218   BMET  Recent Labs  05/23/16 0429 05/24/16 0231  NA 134* 137  K 4.1 3.2*  CL 104 104  CO2 25 27  GLUCOSE 150* 116*  BUN 9 5*  CREATININE 0.61 0.62  CALCIUM 8.8* 8.8*   PT/INR No results for input(s): LABPROT, INR in the last 72 hours. CMP     Component Value Date/Time   NA 137 05/24/2016 0231   K 3.2 (L) 05/24/2016 0231   CL 104 05/24/2016 0231   CO2 27 05/24/2016 0231   GLUCOSE 116 (H) 05/24/2016 0231   BUN 5 (L) 05/24/2016 0231   CREATININE 0.62 05/24/2016 0231   CREATININE 0.73 11/02/2014 1316   CALCIUM 8.8 (L) 05/24/2016 0231   PROT 6.6 05/15/2016 1713   ALBUMIN 4.0 05/15/2016 1713   AST 24 05/15/2016 1713   ALT 21 05/15/2016 1713   ALKPHOS 72  05/15/2016 1713   BILITOT 0.7 05/15/2016 1713   GFRNONAA >60 05/24/2016 0231   GFRNONAA 83 09/22/2014 1615   GFRAA >60 05/24/2016 0231   GFRAA >89 09/22/2014 1615   Lipase     Component Value Date/Time   LIPASE 24 05/15/2016 1713       Studies/Results: Dg Abd Portable 1v  Result Date: 05/22/2016 CLINICAL DATA:  Initial evaluation for NG tube placement. EXAM: PORTABLE ABDOMEN - 1 VIEW COMPARISON:  Prior radiograph from 05/20/2016. FINDINGS: Tip of an enteric to overlies the gastric body, projecting inferiorly. Side hole partially visualized, likely just above the GE junction. Persistent dilated loops of gas-filled small bowel seen within the lower abdomen, measuring up to 4.9 cm, similar to previous. Retained enteric contrast material within the colon. IMPRESSION: 1. Tip of enteric tube overlying the gastric body, side hole likely just above the GE junction. 2. Dilated gas-filled loops of small bowel, similar to previous, and concerning for persistent small bowel obstruction. Enteric contrast material is now seen within the colon. Electronically Signed   By: Rise Mu M.D.   On: 05/22/2016 21:06    Anti-infectives: Anti-infectives    Start     Dose/Rate Route Frequency Ordered Stop   05/21/16 1800  ciprofloxacin (CIPRO) IVPB 400 mg     400  mg 200 mL/hr over 60 Minutes Intravenous Every 12 hours 05/21/16 1355     05/20/16 2100  metroNIDAZOLE (FLAGYL) IVPB 500 mg     500 mg 100 mL/hr over 60 Minutes Intravenous Every 8 hours 05/20/16 2048     05/18/16 1730  ciprofloxacin (CIPRO) IVPB 400 mg  Status:  Discontinued     400 mg 200 mL/hr over 60 Minutes Intravenous Every 12 hours 05/18/16 1721 05/21/16 1355   05/18/16 0800  ciprofloxacin (CIPRO) tablet 500 mg  Status:  Discontinued     500 mg Oral 2 times daily 05/18/16 0749 05/18/16 1721   05/18/16 0800  metroNIDAZOLE (FLAGYL) tablet 500 mg  Status:  Discontinued     500 mg Oral Every 8 hours 05/18/16 0749 05/20/16 2048        Assessment/Plan  s/p Procedure(s): LAPAROSCOPY DIAGNOSTIC, LYSIS OF ADHESION BAND (N/A) REPAIR ENTEROTOMY 1/26 Derrell Lolling)  - pt is having flatus and had a Bm this morning - advance to clears  - shoulder pain could be related to gas from surgery, common in postop laparoscopic procedures, will monitor   LOS: 8 days    Jerre Simon , [facility] 05/24/2016, 10:07 AM Pager: [phone] Consults: [phone] Mon-Fri 7:00 am-4:30 pm Sat-Sun 7:00 am-11:30 am

## 2016-05-24 NOTE — Progress Notes (Signed)
PROGRESS NOTE    [name] [name]  LAG:[id number] DOB: [birth year] DOA: 05/15/2016 PCP: Minda Meo, MD   Chief Complaint  Patient presents with  . Palpitations    Brief Narrative:  HPI on 05/15/2016 by Dr. Marcial Pacas [name] [name] is a 76 y.o. female with medical history significant for hypertension, coronary artery disease, GERD, hyperthyroidism, and mitral valve prolapse status post MVR who presents to the emergency department with one-day vomiting and palpitations. Patient reports that she had been in her usual state of health until shortly after breakfast when she developed acute nausea with nonbloody nonbilious vomiting. These symptoms persisted throughout the morning and afternoon and she developed palpitations. She reports a history of anxiety and has been very concerned with the palpitations, prompting her presentation to the ED. She reports a normal bowel movement the day prior. She reports mid-abdominal pain, described as cramping, moderate in intensity, localized to the mid-abdomen, and without associated fevers, chills, or diarrhea. Patient had eaten organic eggs for breakfast and suspects that has causing her symptoms. She has not been able to tolerate anything by mouth since the vomiting developed. She tried taking the Protonix at home, but quickly vomited and then felt as though it made things worse. She denies similar symptoms previously.   A CT of the abdomen and pelvis was obtained and demonstrates a small bowel obstruction with transition point in the mid abdomen, with findings suggestive of an internal hernia that site. Admitted with SBO, surgery consulted and helping with management.   Assessment & Plan   Small bowel obstruction -CT abd/pelvis demonstrates SBO with findings suggestive of internal hernia. Transition point.  -Continue with IV fluids, pain control -General surgery consulted and appreciated -KUB showed SBO pattern, stool burden -Gastrograffin  given. KUB continued to show SBO -Continue cipro/flagyl -NG tube reinserted on 05/20/2016. NGTube removed by patient again. -Successful bowel movement on 05/20/2016 with enema -Repeat xray on 1/25 showed persistent SBO -S/p exploratory laparoscopy with lysis of adhesion band on 05/21/16 -Advancing to clear liquids today -patient is having BM and flatus  Palpitations/ Frequents PVC.  -Improved after IV fluids. -Magnesium replaced. Continue to replace potassium -Coreg held due to NPO status and surgery -Will restart once patient is able to tolerate liquids  MVR, 2014 -Echocardiogram in 2017: Mitral valve: An annular ring prosthesis was present. Mobility was restricted. Transvalvular velocity was increased, due to stenosis. The findings are consistent with moderate stenosis. -Dr. Jacinto Halim aware of patient's admission.   Essential hypertension -BP stable, Coreg held  Anxiety -Continue ativan PRN    Hypokalemia/Hypomagnesemia -Replacing. Continue to monitor  Normocytic Anemia/ Anemia secondary to blood loss? -hemoglobin currently 9.7  -Will continue to monitor   DVT Prophylaxis  lovenox  Code Status: Full  Family Communication: None at bedside  Disposition Plan: Admitted. Advancing diet to clear liquid today  Consultants Cardiology General surgery  Procedures  Exploratory laparoscopy with lysis of adhesion band  Antibiotics   Anti-infectives    Start     Dose/Rate Route Frequency Ordered Stop   05/21/16 1800  ciprofloxacin (CIPRO) IVPB 400 mg     400 mg 200 mL/hr over 60 Minutes Intravenous Every 12 hours 05/21/16 1355     05/20/16 2100  metroNIDAZOLE (FLAGYL) IVPB 500 mg     500 mg 100 mL/hr over 60 Minutes Intravenous Every 8 hours 05/20/16 2048     05/18/16 1730  ciprofloxacin (CIPRO) IVPB 400 mg  Status:  Discontinued     400 mg 200 mL/hr  over 60 Minutes Intravenous Every 12 hours 05/18/16 1721 05/21/16 1355   05/18/16 0800  ciprofloxacin (CIPRO) tablet 500  mg  Status:  Discontinued     500 mg Oral 2 times daily 05/18/16 0749 05/18/16 1721   05/18/16 0800  metroNIDAZOLE (FLAGYL) tablet 500 mg  Status:  Discontinued     500 mg Oral Every 8 hours 05/18/16 0749 05/20/16 2048      Subjective:   [name] [name] seen and examined today.  States she had 2 bowel movements yesterday and is passing gas. Would like to eat and states she is hungry. Still has abdominal pain. Complains of back pain from lying on the bed. Currently, denies nausea or vomiting. Denies chest pain, shortness of breath, dizziness.  Per RN, patient has been removing her NG Tube several times.   Objective:   Vitals:   05/23/16 1631 05/23/16 2123 05/24/16 0500 05/24/16 0812  BP: (!) 102/48 (!) 127/54 139/64 (!) 124/57  Pulse: 74 94 90 82  Resp: 20 19 20 18   Temp: 98.5 F (36.9 C) 98.7 F (37.1 C) 98.6 F (37 C) 97.8 F (36.6 C)  TempSrc: Oral Oral Oral Oral  SpO2: 99% 94% 99% 99%  Weight:   53.2 kg (117 lb 4.8 oz)   Height:        Intake/Output Summary (Last 24 hours) at 05/24/16 1331 Last data filed at 05/24/16 1131  Gross per 24 hour  Intake             1795 ml  Output             2310 ml  Net             -515 ml   Filed Weights   05/22/16 0546 05/23/16 0607 05/24/16 0500  Weight: 54.5 kg (120 lb 3.2 oz) 58.5 kg (129 lb) 53.2 kg (117 lb 4.8 oz)    Exam  General: Well developed, well nourished, no distress  HEENT: NCAT, mucous membranes moist.  Cardiovascular: S1 S2 auscultated, no murmurs, RRR  Respiratory: Clear to auscultation  Abdomen: Soft, generalized TTP, nondistended, +BS  Extremities: warm dry without cyanosis clubbing or edema  Neuro: AAOx3, nonfocal  Psych: Anxious, no insight into her medical issues   Data Reviewed: I have personally reviewed following labs and imaging studies  CBC:  Recent Labs Lab 05/20/16 0413 05/21/16 0352 05/22/16 0433 05/23/16 0429 05/24/16 0231  WBC 11.0* 10.8* 9.0 12.0* 8.0  HGB 12.8 11.8* 11.1*  10.8* 9.7*  HCT 37.9 35.9* 33.7* 32.9* 29.6*  MCV 86.9 88.0 88.0 88.4 88.6  PLT 239 214 194 207 218   Basic Metabolic Panel:  Recent Labs Lab 05/18/16 0342  05/20/16 0413 05/21/16 0352 05/22/16 0433 05/23/16 0429 05/24/16 0231  NA 135  < > 137 139 137 134* 137  K 2.9*  < > 3.0* 3.0* 2.9* 4.1 3.2*  CL 103  < > 101 102 105 104 104  CO2 24  < > 27 30 28 25 27   GLUCOSE 142*  < > 163* 106* 124* 150* 116*  BUN <5*  < > 11 13 13 9  5*  CREATININE 0.68  < > 0.66 0.76 0.66 0.61 0.62  CALCIUM 9.2  < > 9.8 9.5 8.7* 8.8* 8.8*  MG 1.9  --  1.7 2.2  --  1.6*  --   < > = values in this interval not displayed. GFR: Estimated Creatinine Clearance: 51.8 mL/min (by C-G formula based on SCr of 0.62 mg/dL). Liver  Function Tests: No results for input(s): AST, ALT, ALKPHOS, BILITOT, PROT, ALBUMIN in the last 168 hours. No results for input(s): LIPASE, AMYLASE in the last 168 hours. No results for input(s): AMMONIA in the last 168 hours. Coagulation Profile: No results for input(s): INR, PROTIME in the last 168 hours. Cardiac Enzymes: No results for input(s): CKTOTAL, CKMB, CKMBINDEX, TROPONINI in the last 168 hours. BNP (last 3 results) No results for input(s): PROBNP in the last 8760 hours. HbA1C: No results for input(s): HGBA1C in the last 72 hours. CBG:  Recent Labs Lab 05/20/16 0600 05/21/16 0532 05/22/16 0557 05/23/16 0635 05/24/16 0646  GLUCAP 147* 105* 97 152* 135*   Lipid Profile: No results for input(s): CHOL, HDL, LDLCALC, TRIG, CHOLHDL, LDLDIRECT in the last 72 hours. Thyroid Function Tests: No results for input(s): TSH, T4TOTAL, FREET4, T3FREE, THYROIDAB in the last 72 hours. Anemia Panel: No results for input(s): VITAMINB12, FOLATE, FERRITIN, TIBC, IRON, RETICCTPCT in the last 72 hours. Urine analysis:    Component Value Date/Time   COLORURINE YELLOW 05/15/2016 2146   APPEARANCEUR CLEAR 05/15/2016 2146   LABSPEC 1.015 05/15/2016 2146   PHURINE 7.0 05/15/2016 2146    GLUCOSEU NEGATIVE 05/15/2016 2146   HGBUR MODERATE (A) 05/15/2016 2146   BILIRUBINUR NEGATIVE 05/15/2016 2146   BILIRUBINUR negative 11/02/2014 1336   KETONESUR 15 (A) 05/15/2016 2146   PROTEINUR NEGATIVE 05/15/2016 2146   UROBILINOGEN 0.2 11/02/2014 1336   UROBILINOGEN 0.2 02/10/2013 1615   NITRITE NEGATIVE 05/15/2016 2146   LEUKOCYTESUR NEGATIVE 05/15/2016 2146   Sepsis Labs: @LABRCNTIP (procalcitonin:4,lacticidven:4)  ) Recent Results (from the past 240 hour(s))  Surgical pcr screen     Status: None   Collection Time: 05/21/16 10:12 AM  Result Value Ref Range Status   MRSA, PCR NEGATIVE NEGATIVE Final   Staphylococcus aureus NEGATIVE NEGATIVE Final    Comment:        The Xpert SA Assay (FDA approved for NASAL specimens in patients over 22 years of age), is one component of a comprehensive surveillance program.  Test performance has been validated by [facility] for patients greater than or equal to 14 year old. It is not intended to diagnose infection nor to guide or monitor treatment.       Radiology Studies: Dg Abd Portable 1v  Result Date: 05/22/2016 CLINICAL DATA:  Initial evaluation for NG tube placement. EXAM: PORTABLE ABDOMEN - 1 VIEW COMPARISON:  Prior radiograph from 05/20/2016. FINDINGS: Tip of an enteric to overlies the gastric body, projecting inferiorly. Side hole partially visualized, likely just above the GE junction. Persistent dilated loops of gas-filled small bowel seen within the lower abdomen, measuring up to 4.9 cm, similar to previous. Retained enteric contrast material within the colon. IMPRESSION: 1. Tip of enteric tube overlying the gastric body, side hole likely just above the GE junction. 2. Dilated gas-filled loops of small bowel, similar to previous, and concerning for persistent small bowel obstruction. Enteric contrast material is now seen within the colon. Electronically Signed   By: Rise Mu M.D.   On: 05/22/2016 21:06      Scheduled Meds: . ciprofloxacin  400 mg Intravenous Q12H  . enoxaparin (LOVENOX) injection  40 mg Subcutaneous Q24H  . famotidine (PEPCID) IV  20 mg Intravenous Q12H  . feeding supplement  1 Container Oral BID BM  . mouth rinse  15 mL Mouth Rinse BID  . metronidazole  500 mg Intravenous Q8H  . pantoprazole (PROTONIX) IV  40 mg Intravenous Q12H  . sodium chloride flush  3 mL Intravenous Q12H   Continuous Infusions: . dextrose 5 % and 0.45% NaCl 1,000 mL with potassium chloride 60 mEq infusion 100 mL/hr at 05/24/16 0835  . lactated ringers       LOS: 8 days   Time Spent in minutes   30 minutes  Kade Rickels D.O. on 05/24/2016 at 1:31 PM  Between 7am to 7pm - Pager - [phone]  After 7pm go to [email or web address] - password [email or web address]  And look for the night coverage person covering for me after hours  [facility] Office  [phone]

## 2016-05-24 NOTE — Progress Notes (Signed)
Nutrition Follow-up  DOCUMENTATION CODES:   Non-severe (moderate) malnutrition in context of chronic illness, Underweight  INTERVENTION:  Continue Boost Breeze po BID, each supplement provides 250 kcal and 9 grams of protein  Provide 30 ml Pro-stat once daily, provides 100 kcal and 15 grams of protein  NUTRITION DIAGNOSIS:   Inadequate oral intake related to poor appetite as evidenced by mild depletion of body fat, mild depletion of muscle mass.  ongoing  GOAL:   Patient will meet greater than or equal to 90% of their needs  Unmet  MONITOR:   PO intake, Supplement acceptance, Labs, Weight trends, Skin  REASON FOR ASSESSMENT:   Malnutrition Screening Tool    ASSESSMENT:   76 y.o. female with medical history significant for hypertension, coronary artery disease, GERD, hyperthyroidism, and mitral valve prolapse status post MVR who presents to the emergency department with one-day vomiting and palpitations. Patient reports that she had been in her usual state of health until shortly after breakfast when she developed acute nausea with nonbloody nonbilious vomiting. A CT of the abdomen and pelvis was obtained and demonstrates a small bowel obstruction with transition point in the mid abdomen, with findings suggestive of an internal hernia that site.  Pt was NPO from 1/24 until this morning. s/p LAPAROSCOPY DIAGNOSTIC, LYSIS OF ADHESION BAND,REPAIR ENTEROTOMY on 1/26. Pt states that she had a BM and is passing gas. She denies any nausea or abdominal pain with PO intake. She doesn't like the [employer] supplement very much, but she is drinking it anyway. Encouraged pt to continue consuming supplements. Pt's weight has remained stable.   Labs: low potassium, low hemoglobin, low magnesium  Diet Order:  Diet clear liquid Room service appropriate? Yes; Fluid consistency: Thin  Skin:  Reviewed, no issues  Last BM:  1/29  Height:   Ht Readings from Last 1 Encounters:  05/15/16  5\' 6"  (1.676 m)    Weight:   Wt Readings from Last 1 Encounters:  05/24/16 117 lb 4.8 oz (53.2 kg)    Ideal Body Weight:  59.09 kg  BMI:  Body mass index is 18.93 kg/m.  Estimated Nutritional Needs:   Kcal:  1300-1500  Protein:  65-75 grams  Fluid:  1.5 L/day  EDUCATION NEEDS:   No education needs identified at this time  Dorothea Ogle RD, CSP, LDN Inpatient Clinical Dietitian Pager: [phone] After Hours Pager: [phone]

## 2016-05-25 DIAGNOSIS — E440 Moderate protein-calorie malnutrition: Secondary | ICD-10-CM

## 2016-05-25 LAB — GLUCOSE, CAPILLARY: Glucose-Capillary: 94 mg/dL (ref 65–99)

## 2016-05-25 LAB — BASIC METABOLIC PANEL
Anion gap: 5 (ref 5–15)
CO2: 24 mmol/L (ref 22–32)
Calcium: 8.8 mg/dL — ABNORMAL LOW (ref 8.9–10.3)
Chloride: 106 mmol/L (ref 101–111)
Creatinine, Ser: 0.59 mg/dL (ref 0.44–1.00)
GFR calc Af Amer: >60 mL/min (ref >60)
GLUCOSE: 116 mg/dL — AB (ref 65–99)
POTASSIUM: 3.9 mmol/L (ref 3.5–5.1)
Sodium: 135 mmol/L (ref 135–145)

## 2016-05-25 LAB — CBC
HEMATOCRIT: 29.8 % — AB (ref 36.0–46.0)
Hemoglobin: 9.7 g/dL — ABNORMAL LOW (ref 12.0–15.0)
MCH: 28.5 pg (ref 26.0–34.0)
MCHC: 32.6 g/dL (ref 30.0–36.0)
MCV: 87.6 fL (ref 78.0–100.0)
PLATELETS: 230 10*3/uL (ref 150–400)
RBC: 3.4 MIL/uL — AB (ref 3.87–5.11)
RDW: 13.6 % (ref 11.5–15.5)
WBC: 8 10*3/uL (ref 4.0–10.5)

## 2016-05-25 MED ORDER — CARVEDILOL 3.125 MG PO TABS
3.1250 mg | ORAL_TABLET | Freq: Two times a day (BID) | ORAL | Status: DC
  Administered 2016-05-25 – 2016-05-26 (×2): 3.125 mg via ORAL
  Filled 2016-05-25 (×2): qty 1

## 2016-05-25 MED ORDER — CARVEDILOL 3.125 MG PO TABS
3.1250 mg | ORAL_TABLET | Freq: Once | ORAL | Status: AC
  Administered 2016-05-25: 3.125 mg via ORAL
  Filled 2016-05-25: qty 1

## 2016-05-25 NOTE — Progress Notes (Signed)
Son called regarding pt discharge orders, stated she has no discharge orders at this time. Son stated he is on his way to the hospital, concerned for placement upon discharge. Stated wil have PT evaluate and social work  TEFL teacher

## 2016-05-25 NOTE — Discharge Instructions (Signed)
Please arrive at least 30 min before your appointment to complete your check in paperwork.  If you are unable to arrive 30 min prior to your appointment time we may have to cancel or reschedule you. ° °LAPAROSCOPIC SURGERY: POST OP INSTRUCTIONS  °1. DIET: Follow a light bland diet the first 24 hours after arrival home, such as soup, liquids, crackers, etc. Be sure to include lots of fluids daily. Avoid fast food or heavy meals as your are more likely to get nauseated. Eat a low fat the next few days after surgery.  °2. Take your usually prescribed home medications unless otherwise directed. °3. PAIN CONTROL:  °1. Pain is best controlled by a usual combination of three different methods TOGETHER:  °1. Ice/Heat °2. Over the counter pain medication °3. Prescription pain medication °2. Most patients will experience some swelling and bruising around the incisions. Ice packs or heating pads (30-60 minutes up to 6 times a day) will help. Use ice for the first few days to help decrease swelling and bruising, then switch to heat to help relax tight/sore spots and speed recovery. Some people prefer to use ice alone, heat alone, alternating between ice & heat. Experiment to what works for you. Swelling and bruising can take several weeks to resolve.  °3. It is helpful to take an over-the-counter pain medication regularly for the first few weeks. Choose one of the following that works best for you:  °1. Naproxen (Aleve, etc) Two 220mg tabs twice a day °2. Ibuprofen (Advil, etc) Three 200mg tabs four times a day (every meal & bedtime) °3. Acetaminophen (Tylenol, etc) 500-650mg four times a day (every meal & bedtime) °4. A prescription for pain medication (such as oxycodone, hydrocodone, etc) should be given to you upon discharge. Take your pain medication as prescribed.  °1. If you are having problems/concerns with the prescription medicine (does not control pain, nausea, vomiting, rash, itching, etc), please call us [phone] to see if we need to switch you to a different pain medicine that will work better for you and/or control your side effect better. °2. If you need a refill on your pain medication, please contact your pharmacy. They will contact our office to request authorization. Prescriptions will not be filled after 5 pm or on week-ends. °4. Avoid getting constipated. Between the surgery and the pain medications, it is common to experience some constipation. Increasing fluid intake and taking a fiber supplement (such as Metamucil, Citrucel, FiberCon, MiraLax, etc) 1-2 times a day regularly will usually help prevent this problem from occurring. A mild laxative (prune juice, Milk of Magnesia, MiraLax, etc) should be taken according to package directions if there are no bowel movements after 48 hours.  °5. Watch out for diarrhea. If you have many loose bowel movements, simplify your diet to bland foods & liquids for a few days. Stop any stool softeners and decrease your fiber supplement. Switching to mild anti-diarrheal medications (Kayopectate, Pepto Bismol) can help. If this worsens or does not improve, please call us. °6. Wash / shower every day. You may shower over the dressings as they are waterproof. Continue to shower over incision(s) after the dressing is off. °7. Remove your waterproof bandages 5 days after surgery. You may leave the incision open to air. You may replace a dressing/Band-Aid to cover the incision for comfort if you wish.  °8. ACTIVITIES as tolerated:  °1. You may resume regular (light) daily activities beginning the next day--such as daily self-care, walking, climbing stairs--gradually   increasing activities as tolerated. If you can walk 30 minutes without difficulty, it is safe to try more intense activity such as jogging, treadmill, bicycling, low-impact aerobics, swimming, etc. °2. Save the most intensive and strenuous activity for last such as sit-ups, heavy lifting, contact sports, etc Refrain  from any heavy lifting or straining until you are off narcotics for pain control.  °3. DO NOT PUSH THROUGH PAIN. Let pain be your guide: If it hurts to do something, don't do it. Pain is your body warning you to avoid that activity for another week until the pain goes down. °4. You may drive when you are no longer taking prescription pain medication, you can comfortably wear a seatbelt, and you can safely maneuver your car and apply brakes. °5. You may have sexual intercourse when it is comfortable.  °9. FOLLOW UP in our office  °1. Please call [facility] at [phone] to set up an appointment to see your surgeon in the office for a follow-up appointment approximately 2-3 weeks after your surgery. °2. Make sure that you call for this appointment the day you arrive home to insure a convenient appointment time. °     10. IF YOU HAVE DISABILITY OR FAMILY LEAVE FORMS, BRING THEM TO THE               OFFICE FOR PROCESSING.  ° °WHEN TO CALL US [phone]:  °1. Poor pain control °2. Reactions / problems with new medications (rash/itching, nausea, etc)  °3. Fever over 101.5 F (38.5 C) °4. Inability to urinate °5. Nausea and/or vomiting °6. Worsening swelling or bruising °7. Continued bleeding from incision. °8. Increased pain, redness, or drainage from the incision ° °The clinic staff is available to answer your questions during regular business hours (8:30am-5pm). Please don’t hesitate to call and ask to speak to one of our nurses for clinical concerns.  °If you have a medical emergency, go to the nearest emergency room or call 911.  °A surgeon from [facility] is always on call at the hospitals  ° °[facility]  °[address], [address], [place], [place] [address] ?  °MAIN: [phone] ? TOLL FREE: [phone] ?  °FAX [phone]  °[email or web address] ° °

## 2016-05-25 NOTE — Progress Notes (Addendum)
PROGRESS NOTE    [name] [name]  BHA:[id number] DOB: Jun 10, 1940 DOA: 05/15/2016 PCP: Minda Meo, MD   Chief Complaint  Patient presents with  . Palpitations    Brief Narrative:  HPI on 05/15/2016 by Dr. Marcial Pacas Opyd [name] [name] is a 76 y.o. female with medical history significant for hypertension, coronary artery disease, GERD, hyperthyroidism, and mitral valve prolapse status post MVR who presents to the emergency department with one-day vomiting and palpitations. Patient reports that she had been in her usual state of health until shortly after breakfast when she developed acute nausea with nonbloody nonbilious vomiting. These symptoms persisted throughout the morning and afternoon and she developed palpitations. She reports a history of anxiety and has been very concerned with the palpitations, prompting her presentation to the ED. She reports a normal bowel movement the day prior. She reports mid-abdominal pain, described as cramping, moderate in intensity, localized to the mid-abdomen, and without associated fevers, chills, or diarrhea. Patient had eaten organic eggs for breakfast and suspects that has causing her symptoms. She has not been able to tolerate anything by mouth since the vomiting developed. She tried taking the Protonix at home, but quickly vomited and then felt as though it made things worse. She denies similar symptoms previously.   Interim history A CT of the abdomen and pelvis was obtained and demonstrates a small bowel obstruction with transition point in the mid abdomen, with findings suggestive of an internal hernia that site. Admitted with SBO, surgery consulted and helping with management. Underwent lysis of adhesions. Currently on clears.  Assessment & Plan   Small bowel obstruction -CT abd/pelvis demonstrates SBO with findings suggestive of internal hernia. Transition point.  -Continue with IV fluids, pain control -General surgery consulted and  appreciated -KUB showed SBO pattern, stool burden -Gastrograffin given. KUB continued to show SBO -Continue cipro/flagyl -NG tube reinserted on 05/20/2016. NGTube removed by patient again. -Successful bowel movement on 05/20/2016 with enema -Repeat xray on 1/25 showed persistent SBO -S/p exploratory laparoscopy with lysis of adhesion band on 05/21/16 -patient is having BM and flatus -Patient tolerated clear liquid diet well. Surgery advanced diet to soft.  Palpitations/ Frequents PVC.  -Improved after IV fluids. -Magnesium replaced. Continue to replace potassium -Coreg held due to NPO status and surgery -Will restart coreg today  MVR, 2014 -Echocardiogram in 2017: Mitral valve: An annular ring prosthesis was present. Mobility was restricted. Transvalvular velocity was increased, due to stenosis. The findings are consistent with moderate stenosis. -Dr. Jacinto Halim aware of patient's admission.   Essential hypertension -BP stable -Restart coreg today  Anxiety -Continue ativan PRN    Hypokalemia/Hypomagnesemia -Replacing. Continue to monitor  Normocytic Anemia/ Anemia secondary to blood loss? -hemoglobin currently 9.7  -Will continue to monitor   Moderate malnutrition  -Nutrition consulted, continue supplements  Deconditioning  -PT consulted  DVT Prophylaxis  lovenox  Code Status: Full  Family Communication: None at bedside  Disposition Plan: Admitted. Advancing diet to soft. Will consult PT. Possibly discharge in 24 hours  Consultants Cardiology General surgery  Procedures  Exploratory laparoscopy with lysis of adhesion band  Antibiotics   Anti-infectives    Start     Dose/Rate Route Frequency Ordered Stop   05/21/16 1800  ciprofloxacin (CIPRO) IVPB 400 mg     400 mg 200 mL/hr over 60 Minutes Intravenous Every 12 hours 05/21/16 1355     05/20/16 2100  metroNIDAZOLE (FLAGYL) IVPB 500 mg     500 mg 100 mL/hr over 60 Minutes Intravenous  Every 8 hours 05/20/16  2048     05/18/16 1730  ciprofloxacin (CIPRO) IVPB 400 mg  Status:  Discontinued     400 mg 200 mL/hr over 60 Minutes Intravenous Every 12 hours 05/18/16 1721 05/21/16 1355   05/18/16 0800  ciprofloxacin (CIPRO) tablet 500 mg  Status:  Discontinued     500 mg Oral 2 times daily 05/18/16 0749 05/18/16 1721   05/18/16 0800  metroNIDAZOLE (FLAGYL) tablet 500 mg  Status:  Discontinued     500 mg Oral Every 8 hours 05/18/16 0749 05/20/16 2048      Subjective:   [name] [name] seen and examined today.  Patient states she has had gas passage and bowel movements, but feels abdomen is bigger.  Continues to complain of back pain. Would like to eat solid food. Currently, denies nausea or vomiting. Denies chest pain, shortness of breath, dizziness.  Objective:   Vitals:   05/24/16 0812 05/24/16 2055 05/25/16 0507 05/25/16 0823  BP: (!) 124/57 128/64 (!) 146/63 132/73  Pulse: 82 84 92 89  Resp: 18 18 18 19   Temp: 97.8 F (36.6 C) 98.6 F (37 C) 98.5 F (36.9 C)   TempSrc: Oral Oral Oral   SpO2: 99% 97% 97% 99%  Weight:   54.2 kg (119 lb 6.4 oz)   Height:        Intake/Output Summary (Last 24 hours) at 05/25/16 1123 Last data filed at 05/25/16 1006  Gross per 24 hour  Intake             3440 ml  Output             2560 ml  Net              880 ml   Filed Weights   05/23/16 0607 05/24/16 0500 05/25/16 0507  Weight: 58.5 kg (129 lb) 53.2 kg (117 lb 4.8 oz) 54.2 kg (119 lb 6.4 oz)    Exam  General: Well developed, well nourished, no distress  HEENT: NCAT, mucous membranes moist.  Cardiovascular: S1 S2 auscultated, no murmurs, RRR  Respiratory: Clear to auscultation bilaterally   Abdomen: Soft, generalized mild TTP, nondistended, +BS  Extremities: warm dry without cyanosis clubbing or edema  Neuro: AAOx3, nonfocal  Psych: Anxious, no insight into her medical issues   Data Reviewed: I have personally reviewed following labs and imaging studies  CBC:  Recent Labs Lab  05/21/16 0352 05/22/16 0433 05/23/16 0429 05/24/16 0231 05/25/16 0540  WBC 10.8* 9.0 12.0* 8.0 8.0  HGB 11.8* 11.1* 10.8* 9.7* 9.7*  HCT 35.9* 33.7* 32.9* 29.6* 29.8*  MCV 88.0 88.0 88.4 88.6 87.6  PLT 214 194 207 218 230   Basic Metabolic Panel:  Recent Labs Lab 05/20/16 0413 05/21/16 0352 05/22/16 0433 05/23/16 0429 05/24/16 0231 05/25/16 0540  NA 137 139 137 134* 137 135  K 3.0* 3.0* 2.9* 4.1 3.2* 3.9  CL 101 102 105 104 104 106  CO2 27 30 28 25 27 24   GLUCOSE 163* 106* 124* 150* 116* 116*  BUN 11 13 13 9  5* <5*  CREATININE 0.66 0.76 0.66 0.61 0.62 0.59  CALCIUM 9.8 9.5 8.7* 8.8* 8.8* 8.8*  MG 1.7 2.2  --  1.6*  --   --    GFR: Estimated Creatinine Clearance: 52.8 mL/min (by C-G formula based on SCr of 0.59 mg/dL). Liver Function Tests: No results for input(s): AST, ALT, ALKPHOS, BILITOT, PROT, ALBUMIN in the last 168 hours. No results for input(s): LIPASE, AMYLASE  in the last 168 hours. No results for input(s): AMMONIA in the last 168 hours. Coagulation Profile: No results for input(s): INR, PROTIME in the last 168 hours. Cardiac Enzymes: No results for input(s): CKTOTAL, CKMB, CKMBINDEX, TROPONINI in the last 168 hours. BNP (last 3 results) No results for input(s): PROBNP in the last 8760 hours. HbA1C: No results for input(s): HGBA1C in the last 72 hours. CBG:  Recent Labs Lab 05/21/16 0532 05/22/16 0557 05/23/16 0635 05/24/16 0646 05/25/16 0557  GLUCAP 105* 97 152* 135* 94   Lipid Profile: No results for input(s): CHOL, HDL, LDLCALC, TRIG, CHOLHDL, LDLDIRECT in the last 72 hours. Thyroid Function Tests: No results for input(s): TSH, T4TOTAL, FREET4, T3FREE, THYROIDAB in the last 72 hours. Anemia Panel: No results for input(s): VITAMINB12, FOLATE, FERRITIN, TIBC, IRON, RETICCTPCT in the last 72 hours. Urine analysis:    Component Value Date/Time   COLORURINE YELLOW 05/15/2016 2146   APPEARANCEUR CLEAR 05/15/2016 2146   LABSPEC 1.015 05/15/2016  2146   PHURINE 7.0 05/15/2016 2146   GLUCOSEU NEGATIVE 05/15/2016 2146   HGBUR MODERATE (A) 05/15/2016 2146   BILIRUBINUR NEGATIVE 05/15/2016 2146   BILIRUBINUR negative 11/02/2014 1336   KETONESUR 15 (A) 05/15/2016 2146   PROTEINUR NEGATIVE 05/15/2016 2146   UROBILINOGEN 0.2 11/02/2014 1336   UROBILINOGEN 0.2 02/10/2013 1615   NITRITE NEGATIVE 05/15/2016 2146   LEUKOCYTESUR NEGATIVE 05/15/2016 2146   Sepsis Labs: @LABRCNTIP (procalcitonin:4,lacticidven:4)  ) Recent Results (from the past 240 hour(s))  Surgical pcr screen     Status: None   Collection Time: 05/21/16 10:12 AM  Result Value Ref Range Status   MRSA, PCR NEGATIVE NEGATIVE Final   Staphylococcus aureus NEGATIVE NEGATIVE Final    Comment:        The Xpert SA Assay (FDA approved for NASAL specimens in patients over 20 years of age), is one component of a comprehensive surveillance program.  Test performance has been validated by [facility] for patients greater than or equal to 46 year old. It is not intended to diagnose infection nor to guide or monitor treatment.       Radiology Studies: No results found.   Scheduled Meds: . ciprofloxacin  400 mg Intravenous Q12H  . enoxaparin (LOVENOX) injection  40 mg Subcutaneous Q24H  . famotidine (PEPCID) IV  20 mg Intravenous Q12H  . feeding supplement  1 Container Oral BID BM  . feeding supplement (PRO-STAT SUGAR FREE 64)  30 mL Oral Daily  . mouth rinse  15 mL Mouth Rinse BID  . metronidazole  500 mg Intravenous Q8H  . pantoprazole (PROTONIX) IV  40 mg Intravenous Q12H  . sodium chloride flush  3 mL Intravenous Q12H   Continuous Infusions: . dextrose 5 % and 0.45% NaCl 1,000 mL with potassium chloride 60 mEq infusion 100 mL/hr at 05/25/16 1107  . lactated ringers       LOS: 9 days   Time Spent in minutes   30 minutes  Mekaela Azizi D.O. on 05/25/2016 at 11:23 AM  Between 7am to 7pm - Pager - [phone]  After 7pm go to [email or web address] - password  [email or web address]  And look for the night coverage person covering for me after hours  [facility] Office  [phone]

## 2016-05-25 NOTE — Progress Notes (Signed)
Pt having multifocal atrial fibrillation tachycardia, MD aware, MD gave verbal order to give coreg 3.125mg  dose now and at 1630  [name] [name]

## 2016-05-25 NOTE — Progress Notes (Signed)
[facility] Progress Note  4 Days Post-Op  Subjective: No new complaints. Pain improved. No nausea or vomiting overnight. Pt states she slept well.   Objective: Vital signs in last 24 hours: Temp:  [98.5 F (36.9 C)-98.6 F (37 C)] 98.5 F (36.9 C) (01/30 0507) Pulse Rate:  [84-92] 89 (01/30 0823) Resp:  [18-19] 19 (01/30 0823) BP: (128-146)/(63-73) 132/73 (01/30 0823) SpO2:  [97 %-99 %] 99 % (01/30 0823) Weight:  [119 lb 6.4 oz (54.2 kg)] 119 lb 6.4 oz (54.2 kg) (01/30 0507) Last BM Date: 05/24/16  Intake/Output from previous day: 01/29 0701 - 01/30 0700 In: 3941.7 [P.O.:600; I.V.:2141.7; IV Piggyback:1200] Out: 2320 [Urine:2320] Intake/Output this shift: No intake/output data recorded.  PE: Gen:  Alert, NAD, pleasant, cooperative, lying in bed Card:  RRR, no M/G/R heard Pulm:  Rate and effort normal Abd: Soft, nondistended, +BS, incisions C/D/I, very mild generalized TTP Skin: no rashes noted, warm and dry  Lab Results:   Recent Labs  05/24/16 0231 05/25/16 0540  WBC 8.0 8.0  HGB 9.7* 9.7*  HCT 29.6* 29.8*  PLT 218 230   BMET  Recent Labs  05/24/16 0231 05/25/16 0540  NA 137 135  K 3.2* 3.9  CL 104 106  CO2 27 24  GLUCOSE 116* 116*  BUN 5* <5*  CREATININE 0.62 0.59  CALCIUM 8.8* 8.8*   PT/INR No results for input(s): LABPROT, INR in the last 72 hours. CMP     Component Value Date/Time   NA 135 05/25/2016 0540   K 3.9 05/25/2016 0540   CL 106 05/25/2016 0540   CO2 24 05/25/2016 0540   GLUCOSE 116 (H) 05/25/2016 0540   BUN <5 (L) 05/25/2016 0540   CREATININE 0.59 05/25/2016 0540   CREATININE 0.73 11/02/2014 1316   CALCIUM 8.8 (L) 05/25/2016 0540   PROT 6.6 05/15/2016 1713   ALBUMIN 4.0 05/15/2016 1713   AST 24 05/15/2016 1713   ALT 21 05/15/2016 1713   ALKPHOS 72 05/15/2016 1713   BILITOT 0.7 05/15/2016 1713   GFRNONAA >60 05/25/2016 0540   GFRNONAA 83 09/22/2014 1615   GFRAA >60 05/25/2016 0540   GFRAA >89 09/22/2014  1615   Lipase     Component Value Date/Time   LIPASE 24 05/15/2016 1713       Studies/Results: No results found.  Anti-infectives: Anti-infectives    Start     Dose/Rate Route Frequency Ordered Stop   05/21/16 1800  ciprofloxacin (CIPRO) IVPB 400 mg     400 mg 200 mL/hr over 60 Minutes Intravenous Every 12 hours 05/21/16 1355     05/20/16 2100  metroNIDAZOLE (FLAGYL) IVPB 500 mg     500 mg 100 mL/hr over 60 Minutes Intravenous Every 8 hours 05/20/16 2048     05/18/16 1730  ciprofloxacin (CIPRO) IVPB 400 mg  Status:  Discontinued     400 mg 200 mL/hr over 60 Minutes Intravenous Every 12 hours 05/18/16 1721 05/21/16 1355   05/18/16 0800  ciprofloxacin (CIPRO) tablet 500 mg  Status:  Discontinued     500 mg Oral 2 times daily 05/18/16 0749 05/18/16 1721   05/18/16 0800  metroNIDAZOLE (FLAGYL) tablet 500 mg  Status:  Discontinued     500 mg Oral Every 8 hours 05/18/16 0749 05/20/16 2048       Assessment/Plan  s/p Procedure(s): LAPAROSCOPY DIAGNOSTIC, LYSIS OF ADHESION BAND (N/A)REPAIR ENTEROTOMY 1/26 Derrell Lolling)  - pt is having flatus and BM's, no nausea or vomiting, tolerating diet - advanced to  soft diet and advance to reg diet as tolerated - If she tolerates her diet she will be okay for discharge from a surgery standpoint.    LOS: 9 days    Jerre Simon , [facility] Surgery 05/25/2016, 9:49 AM Pager: [phone] Consults: [phone] Mon-Fri 7:00 am-4:30 pm Sat-Sun 7:00 am-11:30 am

## 2016-05-25 NOTE — Evaluation (Signed)
Physical Therapy Evaluation Patient Details Name: [name] [name] MRN: [id number] DOB: February 22, 1941 Today's Date: 05/25/2016   History of Present Illness  Pt adm with partial SBO. Underwent laparoscopy for lysis of adhesions on 1/26. PMH - HTN, CAD, MVR  Clinical Impression  Pt admitted with above diagnosis and presents to PT with functional limitations due to deficits listed below (See PT problem list). Pt needs skilled PT to maximize independence and safety to allow discharge to ST-SNF. Pt lives alone and is requiring assist for all mobility. Requires encouragement to mobilize. Feel she needs ST-SNF prior to return home alone.     Follow Up Recommendations SNF    Equipment Recommendations  Rolling walker with 5" wheels    Recommendations for Other Services       Precautions / Restrictions Precautions Precautions: Fall Restrictions Weight Bearing Restrictions: No      Mobility  Bed Mobility Overal bed mobility: Needs Assistance Bed Mobility: Supine to Sit     Supine to sit: Min guard        Transfers Overall transfer level: Needs assistance Equipment used: 1 person hand held assist;Rolling walker (2 wheeled) Transfers: Sit to/from Stand Sit to Stand: Min assist         General transfer comment: Assist for balance and to bring hips up  Ambulation/Gait Ambulation/Gait assistance: Min assist Ambulation Distance (Feet): 90 Feet Assistive device: Rolling walker (2 wheeled) Gait Pattern/deviations: Step-through pattern;Decreased step length - right;Decreased step length - left;Shuffle Gait velocity: decr Gait velocity interpretation: Below normal speed for age/gender General Gait Details: Assist for balance and support and to steer walker. Pt veering left and preoccupied with looking in other rooms.  Stairs            Wheelchair Mobility    Modified Rankin (Stroke Patients Only)       Balance Overall balance assessment: Needs  assistance Sitting-balance support: No upper extremity supported;Feet supported Sitting balance-Leahy Scale: Good     Standing balance support: Single extremity supported Standing balance-Leahy Scale: Poor Standing balance comment: UE support                             Pertinent Vitals/Pain Pain Assessment: 0-10 Pain Score: 5  Pain Location: abdomen    Home Living Family/patient expects to be discharged to:: Private residence Living Arrangements: Alone Available Help at Discharge: Family;Available PRN/intermittently Type of Home: Other(Comment) (condo) Home Access: Stairs to enter Entrance Stairs-Rails: Right;Left;Can reach both Entrance Stairs-Number of Steps: 14 Home Layout: One level Home Equipment: Cane - single point      Prior Function Level of Independence: Independent               Hand Dominance   Dominant Hand: Right    Extremity/Trunk Assessment   Upper Extremity Assessment Upper Extremity Assessment: Overall WFL for tasks assessed    Lower Extremity Assessment Lower Extremity Assessment: Generalized weakness       Communication   Communication: No difficulties  Cognition Arousal/Alertness: Awake/alert Behavior During Therapy: WFL for tasks assessed/performed Overall Cognitive Status: Within Functional Limits for tasks assessed                      General Comments      Exercises     Assessment/Plan    PT Assessment Patient needs continued PT services  PT Problem List Decreased strength;Decreased activity tolerance;Decreased balance;Decreased mobility;Decreased knowledge of use of DME;Pain  PT Treatment Interventions DME instruction;Gait training;Stair training;Functional mobility training;Therapeutic activities;Cognitive remediation;Therapeutic exercise;Balance training    PT Goals (Current goals can be found in the Care Plan section)  Acute Rehab PT Goals Patient Stated Goal: go home PT Goal  Formulation: With patient Time For Goal Achievement: 06/01/16 Potential to Achieve Goals: Good    Frequency Min 3X/week   Barriers to discharge Inaccessible home environment;Decreased caregiver support Lives alone and has 14 steps to enter    Co-evaluation               End of Session   Activity Tolerance: Patient limited by fatigue Patient left: with call bell/phone within reach;Other (comment) (on [facility]) Nurse Communication: Mobility status;Other (comment) (Pt on bsc)         Time: 9507-2257 PT Time Calculation (min) (ACUTE ONLY): 18 min   Charges:   PT Evaluation $PT Eval Moderate Complexity: 1 Procedure     PT G CodesAngelina Ok Maycok 06-10-2016, 2:15 PM [employer] PT [phone]

## 2016-05-25 NOTE — Progress Notes (Signed)
MD called stated pt will not be discharged today. MD aware of order placed for physical therapy evaluation, and agreed. Will continue to monitor patient   [name] [name]

## 2016-05-25 NOTE — Progress Notes (Signed)
Pt daughter called asking about mother condition. Stated pt had bowel movement over night and MD progressed diet.   Izack Hoogland Elige Radon

## 2016-05-26 DIAGNOSIS — I499 Cardiac arrhythmia, unspecified: Secondary | ICD-10-CM | POA: Diagnosis not present

## 2016-05-26 DIAGNOSIS — F419 Anxiety disorder, unspecified: Secondary | ICD-10-CM | POA: Diagnosis not present

## 2016-05-26 DIAGNOSIS — Z48815 Encounter for surgical aftercare following surgery on the digestive system: Secondary | ICD-10-CM | POA: Diagnosis not present

## 2016-05-26 DIAGNOSIS — R002 Palpitations: Secondary | ICD-10-CM | POA: Diagnosis not present

## 2016-05-26 DIAGNOSIS — K56609 Unspecified intestinal obstruction, unspecified as to partial versus complete obstruction: Secondary | ICD-10-CM | POA: Diagnosis not present

## 2016-05-26 DIAGNOSIS — M6281 Muscle weakness (generalized): Secondary | ICD-10-CM | POA: Diagnosis not present

## 2016-05-26 DIAGNOSIS — R008 Other abnormalities of heart beat: Secondary | ICD-10-CM | POA: Diagnosis not present

## 2016-05-26 LAB — CBC
HCT: 32 % — ABNORMAL LOW (ref 36.0–46.0)
Hemoglobin: 10.5 g/dL — ABNORMAL LOW (ref 12.0–15.0)
MCH: 28.9 pg (ref 26.0–34.0)
MCHC: 32.8 g/dL (ref 30.0–36.0)
MCV: 88.2 fL (ref 78.0–100.0)
PLATELETS: 266 10*3/uL (ref 150–400)
RBC: 3.63 MIL/uL — AB (ref 3.87–5.11)
RDW: 13.7 % (ref 11.5–15.5)
WBC: 8.5 10*3/uL (ref 4.0–10.5)

## 2016-05-26 LAB — BASIC METABOLIC PANEL
ANION GAP: 5 (ref 5–15)
CALCIUM: 9.3 mg/dL (ref 8.9–10.3)
CO2: 25 mmol/L (ref 22–32)
Chloride: 103 mmol/L (ref 101–111)
Creatinine, Ser: 0.61 mg/dL (ref 0.44–1.00)
GFR calc Af Amer: >60 mL/min (ref >60)
GLUCOSE: 103 mg/dL — AB (ref 65–99)
POTASSIUM: 4.3 mmol/L (ref 3.5–5.1)
SODIUM: 133 mmol/L — AB (ref 135–145)

## 2016-05-26 LAB — GLUCOSE, CAPILLARY: GLUCOSE-CAPILLARY: 116 mg/dL — AB (ref 65–99)

## 2016-05-26 MED ORDER — BOOST / RESOURCE BREEZE PO LIQD: 1.0000 | Freq: Two times a day (BID) | ORAL | 0 refills | Status: DC

## 2016-05-26 MED ORDER — ALPRAZOLAM 0.25 MG PO TABS: 0.1250 mg | ORAL_TABLET | Freq: Two times a day (BID) | ORAL | 0 refills | Status: DC | PRN

## 2016-05-26 MED ORDER — TRAMADOL HCL 50 MG PO TABS: 25.0000 mg | ORAL_TABLET | Freq: Four times a day (QID) | ORAL | Status: DC | PRN

## 2016-05-26 MED ORDER — PRO-STAT SUGAR FREE PO LIQD: 30.0000 mL | Freq: Every day | ORAL | 0 refills | Status: DC

## 2016-05-26 MED ORDER — TRAMADOL HCL 50 MG PO TABS: 25.0000 mg | ORAL_TABLET | Freq: Four times a day (QID) | ORAL | 0 refills | Status: DC | PRN

## 2016-05-26 NOTE — Clinical Social Work Note (Signed)
CSW facilitated patient discharge including contacting patient family and facility to confirm patient discharge plans. Clinical information faxed to facility and family agreeable with plan. Patient's son will transport patient by car to [facility] around 5:00 pm. Patient's insurance will likely not cover PTAR due to the fact that she is not on oxygen and is ambulating 90 feet. RN to call report prior to discharge [phone]).  CSW will sign off for now as social work intervention is no longer needed. Please consult [place] again if new needs arise.  Charlynn Court, CSW [phone]

## 2016-05-26 NOTE — Evaluation (Signed)
Occupational Therapy Evaluation Patient Details Name: [name] [name] MRN: [id number] DOB: 04-27-1940 Today's Date: 05/26/2016    History of Present Illness Pt adm with partial SBO. Underwent laparoscopy for lysis of adhesions on 1/26. PMH - HTN, CAD, MVR   Clinical Impression   Pt requiring min assist for ADL and mobility and lives alone. Recommending SNF for ST rehab. Will defer further OT to SNF.    Follow Up Recommendations  SNF;Supervision/Assistance - 24 hour    Equipment Recommendations  None recommended by OT    Recommendations for Other Services       Precautions / Restrictions Precautions Precautions: Fall Restrictions Weight Bearing Restrictions: No      Mobility Bed Mobility Overal bed mobility: Modified Independent                Transfers Overall transfer level: Needs assistance Equipment used: 1 person hand held assist Transfers: Sit to/from Stand Sit to Stand: Min guard         General transfer comment: min guard for safety    Balance Overall balance assessment: Needs assistance   Sitting balance-Leahy Scale: Good       Standing balance-Leahy Scale: Poor                              ADL Overall ADL's : Needs assistance/impaired Eating/Feeding: Independent;Sitting   Grooming: Wash/dry hands;Standing;Supervision/safety Grooming Details (indicate cue type and reason): decreased sequencing, distracted by talking Upper Body Bathing: Set up;Sitting   Lower Body Bathing: Min guard;Sit to/from stand   Upper Body Dressing : Set up;Sitting   Lower Body Dressing: Min guard;Sit to/from stand   Toilet Transfer: Minimal assistance;Ambulation   Toileting- Clothing Manipulation and Hygiene: Minimal assistance;Sit to/from stand       Functional mobility during ADLs: Minimal assistance General ADL Comments: pt unsteady in standing     Vision     Perception     Praxis      Pertinent Vitals/Pain Pain Assessment:  Faces Faces Pain Scale: No hurt     Hand Dominance Right   Extremity/Trunk Assessment Upper Extremity Assessment Upper Extremity Assessment: Overall WFL for tasks assessed   Lower Extremity Assessment Lower Extremity Assessment: Generalized weakness       Communication Communication Communication: No difficulties   Cognition Arousal/Alertness: Awake/alert Behavior During Therapy: WFL for tasks assessed/performed Overall Cognitive Status: No family/caregiver present to determine baseline cognitive functioning                 General Comments: pt with poor attention to task, easily distractable with tangential conversation   General Comments       Exercises       Shoulder Instructions      Home Living Family/patient expects to be discharged to:: Skilled nursing facility Living Arrangements: Alone                                      Prior Functioning/Environment Level of Independence: Independent        Comments: pt goes to [employer] to exercise        OT Problem List: Impaired balance (sitting and/or standing);Decreased cognition;Decreased safety awareness   OT Treatment/Interventions:      OT Goals(Current goals can be found in the care plan section) Acute Rehab OT Goals Patient Stated Goal: go home  OT Frequency:  Barriers to D/C:            Co-evaluation              End of Session Equipment Utilized During Treatment: Gait belt  Activity Tolerance: Patient tolerated treatment well Patient left: in chair;with call bell/phone within reach   Time: 1214-1236 OT Time Calculation (min): 22 min Charges:  OT General Charges $OT Visit: 1 Procedure OT Evaluation $OT Eval Moderate Complexity: 1 Procedure G-Codes:    Evern Bio 05/26/2016, 12:51 PM  [phone]

## 2016-05-26 NOTE — Progress Notes (Signed)
[facility] Progress Note  5 Days Post-Op  Subjective: Pt states she is feeling good. Tolerating her diet without nausea or vomiting. Having flatus. She does not want to go to SNF. She states her son will come get her and her granddaughter will stay with her at her house.   Objective: Vital signs in last 24 hours: Temp:  [98 F (36.7 C)-98.6 F (37 C)] 98.6 F (37 C) (01/31 0413) Pulse Rate:  [82-90] 82 (01/31 0413) Resp:  [18-19] 18 (01/31 0413) BP: (123-142)/(51-76) 142/60 (01/31 0413) SpO2:  [98 %-99 %] 98 % (01/31 0413) Weight:  [116 lb (52.6 kg)] 116 lb (52.6 kg) (01/31 0412) Last BM Date: 05/25/16  Intake/Output from previous day: 01/30 0701 - 01/31 0700 In: 4058.7 [P.O.:942; I.V.:2316.7; IV Piggyback:800] Out: 3500 [Urine:3500] Intake/Output this shift: No intake/output data recorded.  PE: Gen: Alert, NAD, pleasant, cooperative, sitting up in bed, well appearing Card: RRR, no M/G/R heard Pulm: Rate and effort normal Abd: Soft, nondistended,+BS, incisions C/D/I, very mild generalized TTP Skin: no rashes noted, warm and dry  Lab Results:   Recent Labs  05/25/16 0540 05/26/16 0515  WBC 8.0 8.5  HGB 9.7* 10.5*  HCT 29.8* 32.0*  PLT 230 266   BMET  Recent Labs  05/25/16 0540 05/26/16 0515  NA 135 133*  K 3.9 4.3  CL 106 103  CO2 24 25  GLUCOSE 116* 103*  BUN <5* <5*  CREATININE 0.59 0.61  CALCIUM 8.8* 9.3   PT/INR No results for input(s): LABPROT, INR in the last 72 hours. CMP     Component Value Date/Time   NA 133 (L) 05/26/2016 0515   K 4.3 05/26/2016 0515   CL 103 05/26/2016 0515   CO2 25 05/26/2016 0515   GLUCOSE 103 (H) 05/26/2016 0515   BUN <5 (L) 05/26/2016 0515   CREATININE 0.61 05/26/2016 0515   CREATININE 0.73 11/02/2014 1316   CALCIUM 9.3 05/26/2016 0515   PROT 6.6 05/15/2016 1713   ALBUMIN 4.0 05/15/2016 1713   AST 24 05/15/2016 1713   ALT 21 05/15/2016 1713   ALKPHOS 72 05/15/2016 1713   BILITOT 0.7  05/15/2016 1713   GFRNONAA >60 05/26/2016 0515   GFRNONAA 83 09/22/2014 1615   GFRAA >60 05/26/2016 0515   GFRAA >89 09/22/2014 1615   Lipase     Component Value Date/Time   LIPASE 24 05/15/2016 1713       Studies/Results: No results found.  Anti-infectives: Anti-infectives    Start     Dose/Rate Route Frequency Ordered Stop   05/21/16 1800  ciprofloxacin (CIPRO) IVPB 400 mg     400 mg 200 mL/hr over 60 Minutes Intravenous Every 12 hours 05/21/16 1355     05/20/16 2100  metroNIDAZOLE (FLAGYL) IVPB 500 mg     500 mg 100 mL/hr over 60 Minutes Intravenous Every 8 hours 05/20/16 2048     05/18/16 1730  ciprofloxacin (CIPRO) IVPB 400 mg  Status:  Discontinued     400 mg 200 mL/hr over 60 Minutes Intravenous Every 12 hours 05/18/16 1721 05/21/16 1355   05/18/16 0800  ciprofloxacin (CIPRO) tablet 500 mg  Status:  Discontinued     500 mg Oral 2 times daily 05/18/16 0749 05/18/16 1721   05/18/16 0800  metroNIDAZOLE (FLAGYL) tablet 500 mg  Status:  Discontinued     500 mg Oral Every 8 hours 05/18/16 0749 05/20/16 2048       Assessment/Plan  s/p Procedure(s): LAPAROSCOPY DIAGNOSTIC, LYSIS OF ADHESION BAND (  N/A)REPAIR ENTEROTOMY 1/26 Derrell Lolling)  - pt is having flatus and BM's, no nausea or vomiting, tolerating diet - advanced to reg diet and is tolerating - okay for discharge from a surgery standpoint.  - pt will need f/u with Dr. Derrell Lolling in 2 weeks - continue soft diet for another 2-3 weeks until f/u  - pt does not want to go to SNF. I have consulted case management to call her son regarding her dispo.    LOS: 10 days    Jerre Simon , [facility] 05/26/2016, 7:53 AM Pager: [phone] Consults: [phone] Mon-Fri 7:00 am-4:30 pm Sat-Sun 7:00 am-11:30 am

## 2016-05-26 NOTE — Clinical Social Work Placement (Signed)
   CLINICAL SOCIAL WORK PLACEMENT  NOTE  Date:  05/26/2016  Patient Details  Name: [name] [name] MRN: [id number] Date of Birth: 08/01/1940  Clinical Social Work is seeking post-discharge placement for this patient at the Skilled  Nursing Facility level of care (*CSW will initial, date and re-position this form in  chart as items are completed):  Yes   Patient/family provided with [place] Clinical Social Work Department's list of facilities offering this level of care within the geographic area requested by the patient (or if unable, by the patient's family).  Yes   Patient/family informed of their freedom to choose among providers that offer the needed level of care, that participate in Medicare, Medicaid or managed care program needed by the patient, have an available bed and are willing to accept the patient.  Yes   Patient/family informed of [facility]'s ownership interest in [facility] and [facility], as well as of the fact that they are under no obligation to receive care at these facilities.  PASRR submitted to EDS on 05/26/16     PASRR number received on       Existing PASRR number confirmed on 05/26/16     FL2 transmitted to all facilities in geographic area requested by pt/family on 05/26/16     FL2 transmitted to all facilities within larger geographic area on       Patient informed that his/her managed care company has contracts with or will negotiate with certain facilities, including the following:            Patient/family informed of bed offers received.  Patient chooses bed at       Physician recommends and patient chooses bed at      Patient to be transferred to   on  .  Patient to be transferred to facility by       Patient family notified on   of transfer.  Name of family member notified:        PHYSICIAN Please sign FL2     Additional Comment:    _______________________________________________ Margarito Liner, LCSW 05/26/2016,  10:14 AM

## 2016-05-26 NOTE — Progress Notes (Signed)
Spoke with pt son, Kasandra Knudsen, over the phone. Explained to pt's son that I had to confirm with pt that it was ok for me to speak with him. Son cut this RN off over the phone claiming that he met me in the hallway yesterday. I explained to son that I have never cared for this pt, he may have possibly passed me in the hallway but I had never met him and either way the pt still had to confirm that I could give him information.  Pt gave consent for me to talk to son. At this time, son laughs at this RN and was very aggravated that I was doing my job. Son at this point acted like he no longer wanted to ask his questions and was told that we have no way of knowing when pt will be d/c to SNF.  I asked son if he would like to be notified when everything is in place, he stated "sure, whatever".

## 2016-05-26 NOTE — NC FL2 (Signed)
[place] MEDICAID FL2 LEVEL OF CARE SCREENING TOOL     IDENTIFICATION  Patient Name: [name] [name] Birthdate: March 12, 1941 Sex: female Admission Date (Current Location): 05/15/2016  [facility] and [place] Number:  Producer, television/film/video and Address:  [facility] [place], [address], [place], [place] [address]      Provider Number: [id number]  Attending Physician Name and Address:  Edsel Petrin, DO  Relative Name and Phone Number:       Current Level of Care: Hospital Recommended Level of Care: Skilled Nursing Facility Prior Approval Number:    Date Approved/Denied:   PASRR Number: [id number]  Discharge Plan: SNF    Current Diagnoses: Patient Active Problem List   Diagnosis Date Noted  . Malnutrition of moderate degree 05/17/2016  . Bigeminy   . Intractable vomiting 05/15/2016  . Heart palpitations 05/15/2016  . SBO (small bowel obstruction) 05/15/2016  . Acute coronary syndrome (HCC) 07/15/2015  . Status post Maze operation for atrial fibrillation 01/08/2013  . S/P MVR (mitral valve repair) 01/08/2013  . Atrial flutter (HCC) 10/30/2012  . Amaurosis fugax 10/30/2012  . Dyspnea 10/28/2012  . 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 Thyroid goiter 08/21/2012  . Microscopic hematuria 08/20/2012  . S/P mitral valve repair 06/28/2012  . History of mitral valve prolapse with severe mitral regurgtation 05/19/2012  . Refusal of blood transfusions as patient is Jehovah's Witness 05/19/2012  . Atrial fibrillation (HCC) 05/16/2012  . Anxiety 04/27/2011  . Hypertension 04/26/2011  . Diverticulitis 04/24/2011  . Hyperlipidemia     Orientation RESPIRATION BLADDER Height & Weight     Self, Time, Situation, Place  Normal Continent Weight: 116 lb (52.6 kg) Height:  5\' 6"  (167.6 cm)  BEHAVIORAL SYMPTOMS/MOOD NEUROLOGICAL BOWEL NUTRITION STATUS   (None)  (None) Continent Diet (Soft)  AMBULATORY STATUS COMMUNICATION OF NEEDS Skin   Limited  Assist Verbally Normal                       Personal Care Assistance Level of Assistance  Bathing, Feeding, Dressing Bathing Assistance: Limited assistance Feeding assistance: Limited assistance Dressing Assistance: Limited assistance     Functional Limitations Info  Sight, Hearing, Speech Sight Info: Adequate Hearing Info: Adequate Speech Info: Adequate    SPECIAL CARE FACTORS FREQUENCY  PT (By licensed PT), Blood pressure, OT (By licensed OT)     PT Frequency: 5 x week OT Frequency: 5 x week            Contractures Contractures Info: Not present    Additional Factors Info  Code Status, Allergies, Psychotropic Code Status Info: Full Allergies Info: Amiodarone, Diltiazem, Macrobid (Nitrofurantoin Monohyd Macro), Aspirin, Naproxen Sodium, Sulfa Drugs Cross Reactors Psychotropic Info: Anxiety         Current Medications (05/26/2016):  This is the current hospital active medication list Current Facility-Administered Medications  Medication Dose Route Frequency Provider Last Rate Last Dose  . acetaminophen (TYLENOL) tablet 650 mg  650 mg Oral Q6H PRN Briscoe Deutscher, MD   650 mg at 05/17/16 2052  . carvedilol (COREG) tablet 3.125 mg  3.125 mg Oral BID WC Maryann Mikhail, DO   3.125 mg at 05/26/16 0551  . ciprofloxacin (CIPRO) IVPB 400 mg  400 mg Intravenous Q12H Francine Graven Simaan, PA-C   400 mg at 05/26/16 0510  . dextrose 5 % and 0.45% NaCl 1,000 mL with potassium chloride 60 mEq infusion   Intravenous Continuous Maryann Mikhail, DO 100 mL/hr at 05/25/16  2303    . enoxaparin (LOVENOX) injection 40 mg  40 mg Subcutaneous Q24H Briscoe Deutscher, MD   40 mg at 05/24/16 0834  . famotidine (PEPCID) IVPB 20 mg premix  20 mg Intravenous Q12H Briscoe Deutscher, MD   20 mg at 05/25/16 2301  . feeding supplement (BOOST / RESOURCE BREEZE) liquid 1 Container  1 Container Oral BID BM Belkys A Regalado, MD   1 Container at 05/25/16 1000  . feeding supplement (PRO-STAT SUGAR FREE 64)  liquid 30 mL  30 mL Oral Daily Maryann Mikhail, DO   30 mL at 05/25/16 1049  . fluticasone (FLONASE) 50 MCG/ACT nasal spray 2 spray  2 spray Each Nare Daily PRN Lavone Neri Opyd, MD      . lactated ringers infusion   Intravenous Continuous Achille Rich, MD      . LORazepam (ATIVAN) injection 0.5 mg  0.5 mg Intravenous Q6H PRN Belkys A Regalado, MD   0.5 mg at 05/24/16 2024  . MEDLINE mouth rinse  15 mL Mouth Rinse BID Briscoe Deutscher, MD   15 mL at 05/25/16 2302  . metroNIDAZOLE (FLAGYL) IVPB 500 mg  500 mg Intravenous Q8H Roma Kayser Schorr, NP   500 mg at 05/26/16 0352  . morphine 4 MG/ML injection 1-3 mg  1-3 mg Intravenous Q1H PRN Rodman Pickle, MD   1 mg at 05/25/16 1634  . ondansetron (ZOFRAN) injection 4 mg  4 mg Intravenous Q6H PRN Belkys A Regalado, MD   4 mg at 05/25/16 0847  . pantoprazole (PROTONIX) injection 40 mg  40 mg Intravenous Q12H Belkys A Regalado, MD   40 mg at 05/25/16 2212  . phenol (CHLORASEPTIC) mouth spray 1 spray  1 spray Mouth/Throat PRN Maryann Mikhail, DO   1 spray at 05/21/16 1710  . sodium chloride flush (NS) 0.9 % injection 3 mL  3 mL Intravenous Q12H Lavone Neri Opyd, MD   3 mL at 05/25/16 1050  . traMADol (ULTRAM) tablet 50 mg  50 mg Oral Q6H PRN Belkys A Regalado, MD   50 mg at 05/25/16 2212  . zolpidem (AMBIEN) tablet 5 mg  5 mg Oral QHS PRN Yates Decamp, MD   5 mg at 05/25/16 2259     Discharge Medications: Please see discharge summary for a list of discharge medications.  Relevant Imaging Results:  Relevant Lab Results:   Additional Information SS#: [id number]. Patient wants a private room.  Margarito Liner, LCSW

## 2016-05-26 NOTE — Clinical Social Work Placement (Signed)
   CLINICAL SOCIAL WORK PLACEMENT  NOTE  Date:  05/26/2016  Patient Details  Name: [name] [name] MRN: [id number] Date of Birth: [birth year]  Clinical Social Work is seeking post-discharge placement for this patient at the Skilled  Nursing Facility level of care (*CSW will initial, date and re-position this form in  chart as items are completed):  Yes   Patient/family provided with [facility] Clinical Social Work Department's list of facilities offering this level of care within the geographic area requested by the patient (or if unable, by the patient's family).  Yes   Patient/family informed of their freedom to choose among providers that offer the needed level of care, that participate in Medicare, Medicaid or managed care program needed by the patient, have an available bed and are willing to accept the patient.  Yes   Patient/family informed of [facility]'s ownership interest in [facility] and [facility], as well as of the fact that they are under no obligation to receive care at these facilities.  PASRR submitted to EDS on 05/26/16     PASRR number received on       Existing PASRR number confirmed on 05/26/16     FL2 transmitted to all facilities in geographic area requested by pt/family on 05/26/16     FL2 transmitted to all facilities within larger geographic area on       Patient informed that his/her managed care company has contracts with or will negotiate with certain facilities, including the following:        Yes   Patient/family informed of bed offers received.  Patient chooses bed at [facility]     Physician recommends and patient chooses bed at      Patient to be transferred to [facility] on 05/26/16.  Patient to be transferred to facility by Son's car     Patient family notified on 05/26/16 of transfer.  Name of family member notified:        PHYSICIAN Please prepare prescriptions     Additional Comment:     _______________________________________________ Margarito Liner, LCSW 05/26/2016, 1:56 PM

## 2016-05-26 NOTE — Clinical Social Work Note (Signed)
Clinical Social Work Assessment  Patient Details  Name: [name] [name] MRN: [id number] Date of Birth: [birth year]  Date of referral:  05/26/16               Reason for consult:  Facility Placement, Discharge Planning                Permission sought to share information with:  Facility Sport and exercise psychologist, Family Supports Permission granted to share information::  Yes, Verbal Permission Granted  Name::     Samyrah Bruster  Agency::  SNF's  Relationship::  Son  Contact Information:  [phone]  Housing/Transportation Living arrangements for the past 2 months:   Theme park manager) Source of Information:  Patient, Medical Team Patient Interpreter Needed:  None Criminal Activity/Legal Involvement Pertinent to Current Situation/Hospitalization:  No - Comment as needed Significant Relationships:  Adult Children, Friend Lives with:  Self Do you feel safe going back to the place where you live?  Yes Need for family participation in patient care:  Yes (Comment)  Care giving concerns:  PT recommending SNF once medically stable for discharge.   Social Worker assessment / plan:  CSW met with patient. No supports at bedside. CSW introduced role and explained that PT recommendations would be discussed. Patient agreeable to SNF although she prefers to go home. Patient will agree to go to SNF with the understanding that she can leave whenever she wants. Discussed preferences: [facility], [facility], and [facility] Patient states that she has a good support system with her children and grandchildren. No further concerns. CSW encouraged patient to contact CSW as needed. CSW will continue to follow patient for support and facilitate discharge to SNF once medically stable.  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 Patient's family supportive and involved in patient's care. Patient appreciated social work intervention.  Patient/Family's Understanding of and Emotional Response to Diagnosis, Current Treatment, and Prognosis:  Patient has a good understanding of the reason for her admission. Patient appears happy with hospital care.  Emotional Assessment Appearance:  Appears stated age Attitude/Demeanor/Rapport:  Other (Pleasant) Affect (typically observed):  Accepting, Appropriate, Calm, Pleasant Orientation:  Oriented to Self, Oriented to Place, Oriented to  Time, Oriented to Situation Alcohol / Substance use:  Never Used Psych involvement (Current and /or in the community):  No (Comment)  Discharge Needs  Concerns to be addressed:  Care Coordination Readmission within the last 30 days:  No Current discharge risk:  Dependent with Mobility, Lives alone Barriers to Discharge:  [place], LCSW 05/26/2016, 10:11 AM

## 2016-05-26 NOTE — Discharge Summary (Signed)
Physician Discharge Summary  RUDELL MARLOWE AVW:[id number] DOB: 09-Apr-1941 DOA: 05/15/2016  PCP: Minda Meo, MD  Admit date: 05/15/2016 Discharge date: 05/26/2016  Time spent: 45 minutes  Recommendations for Outpatient Follow-up:  Patient will be discharged to skilled nursing facility. Continue physical and occupational therapy..  Patient will need to follow up with primary care provider within one week of discharge, repeat CBC.  Follow up with Dr. Derrell Lolling in 2 weeks.  Patient should continue medications as prescribed.  Patient should follow a soft diet.   Discharge Diagnoses:  Small bowel obstruction Palpitations/ Frequents PVC.  MVR, 2014 Essential hypertension Anxiety Hypokalemia/Hypomagnesemia Normocytic Anemia/ Anemia secondary to blood loss? Moderate malnutrition  Deconditioning  Hyperthyroidism  Discharge Condition: Stable  Diet recommendation: Soft  Filed Weights   05/24/16 0500 05/25/16 0507 05/26/16 0412  Weight: 53.2 kg (117 lb 4.8 oz) 54.2 kg (119 lb 6.4 oz) 52.6 kg (116 lb)    History of present illness:  On 05/15/2016 by Dr. Coralyn Helling A Caudleis a 76 y.o.femalewith medical history significant forhypertension, coronary artery disease, GERD, hyperthyroidism, and mitral valve prolapse status post MVR who presents to the emergency department with one-day vomiting and palpitations. Patient reports that she had been in her usual state of health until shortly after breakfast when she developed acute nausea with nonbloody nonbilious vomiting. These symptoms persisted throughout the morning and afternoon and she developed palpitations. She reports a history of anxiety and has been very concerned with the palpitations, prompting her presentation to the ED. She reports a normal bowel movement the day prior. She reports mid-abdominal pain, described as cramping, moderate in intensity, localized to the mid-abdomen, and without associated fevers, chills, or  diarrhea. Patient had eaten organic eggs for breakfast and suspects that has causing her symptoms. She has not been able to tolerate anything by mouth since the vomiting developed. She tried taking the Protonix at home, but quickly vomited and then felt as though it made things worse. She denies similar symptoms previously.  Hospital Course:  Small bowel obstruction -CT abd/pelvis demonstrates SBO with findings suggestive of internal hernia. Transition point.  -Continue with IV fluids, pain control -General surgery consulted and appreciated -KUB showed SBO pattern, stool burden -Gastrograffin given. KUB continued to show SBO -Was placed on cipro/flagyl -NG tube reinserted on 05/20/2016. NGTube removed by patient again. -Successful bowel movement on 05/20/2016 with enema -Repeat xray on 1/25 showed persistent SBO -S/p exploratory laparoscopy with lysis of adhesion band on 05/21/16 -patient is having BM and flatus -Patient tolerated soft diet. Will need to continue soft diet until seen by Dr. Derrell Lolling in 2 weeks.   Palpitations/ Frequents PVC.  -Improved after IV fluids. -Magnesium replaced. Continue to replace potassium -Coreg held due to NPO status and surgery -Continue coreg  MVR, 2014 -Echocardiogram in 2017: Mitral valve: An annular ring prosthesis was present. Mobility was restricted. Transvalvular velocity was increased, due to stenosis. The findings are consistent with moderate stenosis. -Dr. Jacinto Halim aware of patient's admission.   Essential hypertension -BP stable -Continue coreg  Anxiety -was on ativan PRN    Hypokalemia/Hypomagnesemia -Replacing. Continue to monitor  Normocytic Anemia/ Anemia secondary to blood loss? -hemoglobin currently 10.5 -Repeat CBC in one week  Moderate malnutrition  -Nutrition consulted, continue supplements  Deconditioning  -PT consulted and rec SNF  Hyperthyroidism -Continue home methimazole  Consultants Cardiology General  surgery  Procedures  Exploratory laparoscopy with lysis of adhesion band  Discharge Exam: Vitals:   05/26/16 0413 05/26/16 0931  BP: [name])  142/60 (!) 144/64  Pulse: 82 80  Resp: 18 20  Temp: 98.6 F (37 C) 98 F (36.7 C)   Patient states she has had gas passage and bowel movements. Able to eat without nausea or vomiting. Denies abdominal pain. Denies chest pain, shortness of breath, dizziness.  Exam  General: Well developed, well nourished, no distress  HEENT: NCAT, mucous membranes moist.  Cardiovascular: S1 S2 auscultated, no murmurs, RRR  Respiratory: Clear to auscultation bilaterally   Abdomen: Soft, generalized mild TTP, nondistended, +BS  Extremities: warm dry without cyanosis clubbing or edema  Neuro: AAOx3, nonfocal  Psych: Anxious, no insight into her medical issues  Discharge Instructions Discharge Instructions    Discharge instructions    Complete by:  As directed    Patient will be discharged to skilled nursing facility. Continue physical and occupational therapy..  Patient will need to follow up with primary care provider within one week of discharge, repeat CBC.  Follow up with Dr. Derrell Lolling in 2 weeks.  Patient should continue medications as prescribed.  Patient should follow a soft diet.     Current Discharge Medication List    START taking these medications   Details  Amino Acids-Protein Hydrolys (FEEDING SUPPLEMENT, PRO-STAT SUGAR FREE 64,) LIQD Take 30 mLs by mouth daily. Qty: 900 mL, Refills: 0    feeding supplement (BOOST / RESOURCE BREEZE) LIQD Take 1 Container by mouth 2 (two) times daily between meals. Refills: 0      CONTINUE these medications which have CHANGED   Details  ALPRAZolam (XANAX) 0.25 MG tablet Take 0.5-1 tablets (0.125-0.25 mg total) by mouth 2 (two) times daily as needed for anxiety. For anxiety Qty: 15 tablet, Refills: 0    traMADol (ULTRAM) 50 MG tablet Take 0.5 tablets (25 mg total) by mouth every 6 (six) hours as needed  (pain). Qty: 30 tablet      CONTINUE these medications which have NOT CHANGED   Details  acetaminophen (TYLENOL) 500 MG tablet Take 500 mg by mouth every 6 (six) hours as needed (pain).     aspirin EC 81 MG tablet Take 81 mg by mouth daily at 2 PM.     b complex vitamins tablet Take 1 tablet by mouth daily before lunch.    carvedilol (COREG) 3.125 MG tablet Take 1 tablet (3.125 mg total) by mouth 2 (two) times daily with a meal. Qty: 60 tablet, Refills: 3    Cholecalciferol (VITAMIN D3) 2000 UNITS TABS Take 2,000 Units by mouth daily before lunch.     Cyanocobalamin (VITAMIN B-12 PO) Take 1 tablet by mouth daily before lunch.    fluticasone (FLONASE) 50 MCG/ACT nasal spray Place 2 sprays into the nose daily as needed for allergies.     methimazole (TAPAZOLE) 5 MG tablet Take 5 mg by mouth daily.    Omega-3 Fatty Acids (FISH OIL PO) Take 1 capsule by mouth daily before lunch.    OVER THE COUNTER MEDICATION Place 1 drop into both eyes daily as needed (dry eyes/ irritation).    pantoprazole (PROTONIX) 40 MG tablet Take 40 mg by mouth daily.     Probiotic Product (PROBIOTIC DAILY) CAPS Take 1 capsule by mouth daily before lunch.        Allergies  Allergen Reactions  . Amiodarone Other (See Comments)    Hyperthyroidism   . Diltiazem Other (See Comments)    Put into hospital for 3 days  . Macrobid [Nitrofurantoin Monohyd Macro] Nausea Only  . Aspirin Rash  Tolerates low dose aspirin  . Naproxen Sodium Swelling and Rash  . Sulfa Drugs Cross Reactors Other (See Comments)    Allergy per mother - unknown reaction   Follow-up Information    Lajean Saver, MD. Schedule an appointment as soon as possible for a visit in 2 week(s).   Specialty:  General Surgery Why:  for follow up Contact information: [address] [place] [address] [phone]        ARONSON,RICHARD A, MD. Schedule an appointment as soon as possible for a visit in 1 week(s).     Specialty:  Internal Medicine Why:  Hopsital follow up Contact information: [address] [place] [address] [phone]            The results of significant diagnostics from this hospitalization (including imaging, microbiology, ancillary and laboratory) are listed below for reference.    Significant Diagnostic Studies: Dg Abd 1 View  Result Date: 05/17/2016 CLINICAL DATA:  76 year old female with right lower quadrant pain, nausea, vomiting and constipation. History of diverticulosis. Subsequent encounter. EXAM: ABDOMEN - 1 VIEW COMPARISON:  05/16/2016 plain film exam.  05/15/2016 CT. FINDINGS: Nasogastric tube has been removed. Progressive increase in number of gas distended small bowel loops with slightly thickened folds consistent with small bowel obstruction. Stool within the right colon. The possibility of free intraperitoneal air cannot be assessed on a supine view. Mild scoliosis lumbar spine convex right possibly related to splinting. IMPRESSION: Nasogastric tube has been removed. Progressive increase in number of gas distended small bowel loops with slightly thickened folds consistent with small bowel obstruction. Electronically Signed   By: Lacy Duverney M.D.   On: 05/17/2016 09:35   Ct Abdomen Pelvis W Contrast  Result Date: 05/15/2016 CLINICAL DATA:  76 y/o F; abdominal pain radiating to the back with vomiting. EXAM: CT ABDOMEN AND PELVIS WITH CONTRAST TECHNIQUE: Multidetector CT imaging of the abdomen and pelvis was performed using the standard protocol following bolus administration of intravenous contrast. CONTRAST:  1 ISOVUE-300 IOPAMIDOL (ISOVUE-300) INJECTION 61% COMPARISON:  09/22/2014 CT abdomen and pelvis FINDINGS: Lower chest: Cardiomegaly with biatrial enlargement. Mitral annuloplasty. Mild aortic valvular calcification. Hepatobiliary: Focal fat along falciform ligament. Normal gallbladder. No intra or extrahepatic biliary ductal dilatation. Pancreas:  Unremarkable. No pancreatic ductal dilatation or surrounding inflammatory changes. Spleen: Punctate posterior calcification is stable compatible sequelae of prior granulomatous disease. Adrenals/Urinary Tract: Bilateral renal cysts largest on the right extending inferiorly exophytic leak from the right kidney lower pole, decreased since from prior study. Normal adrenal glands. No urinary stone disease or obstructive uropathy. Normal bladder. Stomach/Bowel: Small bowel obstruction in the left hemiabdomen with transition point in left mid abdomen (series 5, image 24). There appears to be several loops of collapsed bowel that are edematous with small surrounding small volume of ascites in the left upper quadrant (series 5, image 39) and there appears to be afferent and effent limbs passing through the same region with mesentery suggesting an internal hernia (series 6, image 71). Diverticulosis without diverticulitis. Vascular/Lymphatic: Surgical clips in the right groin likely related to prior vascular access. Severe aortic atherosclerosis with concentric calcification no aortic aneurysm. No lymphadenopathy identified. Reproductive: Uterus and bilateral adnexa are unremarkable. Other: Trace volume of pelvic ascites. Musculoskeletal: Lumbar degenerative changes with prominent lower lumbar facet arthropathy. No acute osseous abnormality. IMPRESSION: 1. Small bowel obstruction with transition point in the left mid abdomen. At the point of transition there are findings suggesting internal hernia of small bowel  into the left posterior upper quadrant. 2. Cardiomegaly. 3. Aortic atherosclerosis. These results will be called to the ordering clinician or representative by the Radiologist Assistant, and communication documented in the PACS or zVision Dashboard. Electronically Signed   By: Mitzi Hansen M.D.   On: 05/15/2016 22:44   Dg Abd 2 Views  Result Date: 05/18/2016 CLINICAL DATA:  76 year old female with  small bowel obstruction. Subsequent encounter. EXAM: ABDOMEN - 2 VIEW COMPARISON:  05/17/2016 FINDINGS: Persistent gas distended small bowel loops with thickened folds consistent with small bowel obstruction without improvement. No free air. Stool right colon. IMPRESSION: Persistent high-grade small bowel obstruction. Electronically Signed   By: Lacy Duverney M.D.   On: 05/18/2016 13:23   Dg Abd Portable 1v  Result Date: 05/22/2016 CLINICAL DATA:  Initial evaluation for NG tube placement. EXAM: PORTABLE ABDOMEN - 1 VIEW COMPARISON:  Prior radiograph from 05/20/2016. FINDINGS: Tip of an enteric to overlies the gastric body, projecting inferiorly. Side hole partially visualized, likely just above the GE junction. Persistent dilated loops of gas-filled small bowel seen within the lower abdomen, measuring up to 4.9 cm, similar to previous. Retained enteric contrast material within the colon. IMPRESSION: 1. Tip of enteric tube overlying the gastric body, side hole likely just above the GE junction. 2. Dilated gas-filled loops of small bowel, similar to previous, and concerning for persistent small bowel obstruction. Enteric contrast material is now seen within the colon. Electronically Signed   By: Rise Mu M.D.   On: 05/22/2016 21:06   Dg Abd Portable 1v  Result Date: 05/20/2016 CLINICAL DATA:  Follow-up small bowel obstruction. Initial encounter. EXAM: PORTABLE ABDOMEN - 1 VIEW COMPARISON:  Abdominal radiograph performed 05/20/2016 FINDINGS: There is persistent dilatation of small-bowel loops to 4.9 cm in diameter, concerning for relatively high-grade small bowel obstruction. Contrast is seen within the small bowel. No free intra-abdominal air is identified, though evaluation for free air is limited on a single supine view. Stool is noted within the ascending colon. The visualized osseous structures are within normal limits; the sacroiliac joints are unremarkable in appearance. The visualized lung  bases are essentially clear. The patient's enteric tube is noted ending overlying the fundus of the stomach, with the side port about the distal esophagus. IMPRESSION: Persistent dilatation of small-bowel loops to 4.9 cm in diameter, concerning for relatively high-grade small bowel obstruction. Contrast remains within the small bowel. No free intra-abdominal air seen. Electronically Signed   By: Roanna Raider M.D.   On: 05/20/2016 20:51   Dg Abd Portable 1v  Result Date: 05/20/2016 CLINICAL DATA:  NG tube placement, history of small-bowel obstruction EXAM: PORTABLE ABDOMEN - 1 VIEW COMPARISON:  KUB of 05/19/2016 and CT abdomen and pelvis of 05/15/2016 FINDINGS: The tip of the NG tube overlies the distal esophagus and needs to be advanced by approximately 10 to 15 cm into the stomach. There is persistent dilatation of small bowel consistent with small bowel obstruction. The stomach appears fluid filled. IMPRESSION: NG tube tip within the distal esophagus and needs to be advanced at least 10 cm. Little change in small bowel obstructive pattern. Electronically Signed   By: Dwyane Dee M.D.   On: 05/20/2016 12:44   Dg Abd Portable 1v-small Bowel Obstruction Protocol-initial, 8 Hr Delay  Result Date: 05/19/2016 CLINICAL DATA:  Small bowel obstruction. EXAM: PORTABLE ABDOMEN - 1 VIEW COMPARISON:  Earlier the same day. FINDINGS: AP supine portable study at 2031 hours shows contrast material in the stomach and proximal small bowel.  Small bowel loops remain dilated with gas filled with left abdominal loop measuring up to 5.4 cm diameter. No evidence for contrast material in the colon. IMPRESSION: Imaging features remain consistent with small bowel obstruction. Electronically Signed   By: Kennith Center M.D.   On: 05/19/2016 22:28   Dg Abd Portable 1v  Result Date: 05/16/2016 CLINICAL DATA:  Small bowel obstruction.  Evaluate NG tube. EXAM: PORTABLE ABDOMEN - 1 VIEW COMPARISON:  CT scan from yesterday FINDINGS: An  NG tube terminates in the stomach. A mildly prominent air-filled loop of small bowel in the left pelvis measures 3.2 cm. The bowel gas pattern is otherwise unremarkable. Contrast is seen the bladder consistent with yesterday's CT scan. Oval high attenuation in the right upper abdomen is probably in the right renal collecting system. No other acute abnormalities. IMPRESSION: Appropriate placement of NG tube.  Improving bowel gas pattern. Electronically Signed   By: Gerome Sam III M.D   On: 05/16/2016 10:15    Microbiology: Recent Results (from the past 240 hour(s))  Surgical pcr screen     Status: None   Collection Time: 05/21/16 10:12 AM  Result Value Ref Range Status   MRSA, PCR NEGATIVE NEGATIVE Final   Staphylococcus aureus NEGATIVE NEGATIVE Final    Comment:        The Xpert SA Assay (FDA approved for NASAL specimens in patients over 41 years of age), is one component of a comprehensive surveillance program.  Test performance has been validated by [facility] for patients greater than or equal to 20 year old. It is not intended to diagnose infection nor to guide or monitor treatment.      Labs: Basic Metabolic Panel:  Recent Labs Lab 05/20/16 0413 05/21/16 0352 05/22/16 0433 05/23/16 0429 05/24/16 0231 05/25/16 0540 05/26/16 0515  NA 137 139 137 134* 137 135 133*  K 3.0* 3.0* 2.9* 4.1 3.2* 3.9 4.3  CL 101 102 105 104 104 106 103  CO2 27 30 28 25 27 24 25   GLUCOSE 163* 106* 124* 150* 116* 116* 103*  BUN 11 13 13 9  5* <5* <5*  CREATININE 0.66 0.76 0.66 0.61 0.62 0.59 0.61  CALCIUM 9.8 9.5 8.7* 8.8* 8.8* 8.8* 9.3  MG 1.7 2.2  --  1.6*  --   --   --    Liver Function Tests: No results for input(s): AST, ALT, ALKPHOS, BILITOT, PROT, ALBUMIN in the last 168 hours. No results for input(s): LIPASE, AMYLASE in the last 168 hours. No results for input(s): AMMONIA in the last 168 hours. CBC:  Recent Labs Lab 05/22/16 0433 05/23/16 0429 05/24/16 0231  05/25/16 0540 05/26/16 0515  WBC 9.0 12.0* 8.0 8.0 8.5  HGB 11.1* 10.8* 9.7* 9.7* 10.5*  HCT 33.7* 32.9* 29.6* 29.8* 32.0*  MCV 88.0 88.4 88.6 87.6 88.2  PLT 194 207 218 230 266   Cardiac Enzymes: No results for input(s): CKTOTAL, CKMB, CKMBINDEX, TROPONINI in the last 168 hours. BNP: BNP (last 3 results)  Recent Labs  07/15/15 1840  BNP 471.3*    ProBNP (last 3 results) No results for input(s): PROBNP in the last 8760 hours.  CBG:  Recent Labs Lab 05/22/16 0557 05/23/16 0635 05/24/16 0646 05/25/16 0557 05/26/16 0538  GLUCAP 97 152* 135* 94 116*       Signed:  Taniesha Glanz  [facility] 05/26/2016, 12:33 PM

## 2016-05-26 NOTE — Progress Notes (Signed)
Called report to [facility]. Pt's granddaughter is picking pt up to transport to [facility]. IV and tele have been removed.

## 2016-05-26 NOTE — Clinical Social Work Note (Addendum)
[facility] has a private room for patient today. Patient accepts bed offer. CSW called [employer] and started insurance authorization for discharge today.  Charlynn Court, CSW [phone]  1:56 pm Insurance authorization obtained: [id number].  Charlynn Court, CSW [phone]

## 2016-05-27 ENCOUNTER — Non-Acute Institutional Stay (SKILLED_NURSING_FACILITY): Payer: PPO | Admitting: Adult Health

## 2016-05-27 ENCOUNTER — Encounter: Payer: Self-pay | Admitting: Adult Health

## 2016-05-27 DIAGNOSIS — F419 Anxiety disorder, unspecified: Secondary | ICD-10-CM

## 2016-05-27 DIAGNOSIS — I10 Essential (primary) hypertension: Secondary | ICD-10-CM

## 2016-05-27 DIAGNOSIS — M6281 Muscle weakness (generalized): Secondary | ICD-10-CM | POA: Diagnosis not present

## 2016-05-27 DIAGNOSIS — D62 Acute posthemorrhagic anemia: Secondary | ICD-10-CM | POA: Diagnosis not present

## 2016-05-27 DIAGNOSIS — E0590 Thyrotoxicosis, unspecified without thyrotoxic crisis or storm: Secondary | ICD-10-CM | POA: Diagnosis not present

## 2016-05-27 DIAGNOSIS — Z48815 Encounter for surgical aftercare following surgery on the digestive system: Secondary | ICD-10-CM | POA: Diagnosis not present

## 2016-05-27 DIAGNOSIS — R5381 Other malaise: Secondary | ICD-10-CM | POA: Diagnosis not present

## 2016-05-27 DIAGNOSIS — K219 Gastro-esophageal reflux disease without esophagitis: Secondary | ICD-10-CM | POA: Diagnosis not present

## 2016-05-27 DIAGNOSIS — E871 Hypo-osmolality and hyponatremia: Secondary | ICD-10-CM | POA: Diagnosis not present

## 2016-05-27 DIAGNOSIS — R008 Other abnormalities of heart beat: Secondary | ICD-10-CM | POA: Diagnosis not present

## 2016-05-27 DIAGNOSIS — I480 Paroxysmal atrial fibrillation: Secondary | ICD-10-CM | POA: Diagnosis not present

## 2016-05-27 DIAGNOSIS — K5901 Slow transit constipation: Secondary | ICD-10-CM | POA: Diagnosis not present

## 2016-05-27 DIAGNOSIS — J309 Allergic rhinitis, unspecified: Secondary | ICD-10-CM

## 2016-05-27 DIAGNOSIS — K56609 Unspecified intestinal obstruction, unspecified as to partial versus complete obstruction: Secondary | ICD-10-CM

## 2016-05-27 NOTE — Progress Notes (Signed)
DATE:  05/27/2016   MRN:  [id number]  BIRTHDAY: 17-Jul-1940  Facility:  Nursing Home Location:  [facility]  Nursing Home Room Number: 604-P  LEVEL OF CARE:  SNF [phone])  Contact Information    Name Relation Home Work Mobile   Vivian Spouse   [phone]   [facility] Daughter [phone]  [phone]   Zella Ball [phone]         Code Status History    Date Active Date Inactive Code Status Order ID Comments User Context   05/15/2016 10:36 PM 05/26/2016  7:07 PM Full Code [id number]  Briscoe Deutscher, MD ED   07/15/2015  5:52 PM 07/18/2015  9:58 PM Full Code [id number]  Orpah Cobb, MD Inpatient   07/03/2012  9:19 AM 07/06/2012  9:07 PM Full Code [id number]  Purcell Nails, MD Inpatient   06/28/2012  4:48 PM 07/03/2012  9:19 AM Full Code [id number]  Purcell Nails, MD Inpatient   05/15/2012  7:34 PM 05/24/2012  9:10 PM Full Code [id number]  Cephus Slater, RN Inpatient       Chief Complaint  Patient presents with  . Hospitalization Follow-up    HISTORY OF PRESENT ILLNESS:  This is a 76-YO female seen for follow-up.  She was admitted to [facility] for short-term rehabilitation on 05/26/2016 following an admission at [facility] 05/15/2016-05/26/2016 for a one-day history of vomiting and palpitations. CTAbdomen/pelvis showed SBO with findings suggestive of internal hernia. KUB showed SBO pattern and stool burden. She was given IV fluids and pain control. She was placed on Cipro/Flagyl and NG tube insertion. She had a bowel movement on 05/20/16 with enema however, repeat x-ray on 1/25 showed persistent SBO. She had exploratory laparoscopy with lysis of adhesion band on 05/21/16.  She has PMH of hypertension, coronary artery disease, GERD, hyperthyroidism and mitral valve S/P MVR.   She was seen in the room today and complained of constipation.   PAST MEDICAL HISTORY:  Past Medical History:  Diagnosis Date  . Acute coronary syndrome (HCC)  06/2015  . 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 Anemia   . Anxiety   . Aortic stenosis 05/19/2012   trivial by TEE  . Atrial fibrillation (HCC)    a. Dx 04/2012;  b. Amio d/c'd 2/2 hyperthyroidism;  c. Atrial flutter 10/2012;  d. On xarelto.  [name]   . CHF (congestive heart failure) (HCC)   . Complication of anesthesia   . Coronary artery disease   . Diverticulitis   . GERD (gastroesophageal reflux disease)   . Goiter   . Headache   . Heart murmur   . Hyperlipidemia    recently- taken off Crestor- due to pain in her legs, but this was after cardiac cath. , pt. questioning whether the pain in her legs was related to cath. or crestor  . Hypertension    Then hypotension 04/2012 limiting med adjustment.  . Intractable vomiting   . Malnutrition of moderate degree (HCC)   . Microscopic hematuria 08/20/2012  . Mitral valve prolapse    a. H/o MVP, with severe MR 04/2012, had MV repair and Maze  . Pneumonia    treated /w antibiotic- [facility]- early Feb. 2014  . PONV (postoperative nausea and vomiting)   . Refusal of blood transfusions as patient is Jehovah's Witness   . S/P Maze operation for atrial fibrillation 06/28/2012   Complete bilateral atrial lesion set using cryothermy ablation with oversewing of LA appendage via right  minithoracotomy  . S/P mitral valve repair 06/28/2012   Complex valvuloplasty including quadrangular resection of posterior leaflet, sliding leaflet plasty, artificial Gore-tex neocord placement x2 and 32mm Sorin Memo 3D ring annuloplasty via right mini thoracotomy   . SBO (small bowel obstruction)   . Shortness of breath      CURRENT MEDICATIONS: Reviewed  Patient's Medications  New Prescriptions   No medications on file  Previous Medications   ACETAMINOPHEN (TYLENOL) 500 MG TABLET    Take 500 mg by mouth every 6 (six) hours as needed (pain).    ALPRAZOLAM (XANAX) 0.25 MG TABLET    Take 0.5-1 tablets (0.125-0.25 mg total) by mouth 2 (two) times daily as  needed for anxiety. For anxiety   AMINO ACIDS-PROTEIN HYDROLYS (FEEDING SUPPLEMENT, PRO-STAT SUGAR FREE 64,) LIQD    Take 30 mLs by mouth daily.   ASPIRIN EC 81 MG TABLET    Take 81 mg by mouth daily at 2 PM.    B COMPLEX VITAMINS TABLET    Take 1 tablet by mouth daily before lunch.   CARVEDILOL (COREG) 3.125 MG TABLET    Take 1 tablet (3.125 mg total) by mouth 2 (two) times daily with a meal.   CHOLECALCIFEROL (VITAMIN D3) 2000 UNITS TABS    Take 2,000 Units by mouth daily before lunch.    CYANOCOBALAMIN (VITAMIN B-12 PO)    Take 1,000 mcg by mouth daily before lunch.    FLUTICASONE (FLONASE) 50 MCG/ACT NASAL SPRAY    Place 2 sprays into the nose daily as needed for allergies.    METHIMAZOLE (TAPAZOLE) 5 MG TABLET    Take 5 mg by mouth daily.    NUTRITIONAL SUPPLEMENT LIQD    Take 90 mLs by mouth 2 (two) times daily between meals. Med[employer]   OMEGA-3 FATTY ACIDS (FISH OIL PO)    Take 1 capsule by mouth daily before lunch.   OVER THE COUNTER MEDICATION    Place 1 drop into both eyes daily as needed (dry eyes/ irritation). Artificial Tears   PANTOPRAZOLE (PROTONIX) 40 MG TABLET    Take 40 mg by mouth daily.    PROBIOTIC PRODUCT (PROBIOTIC DAILY) CAPS    Take 250 mg by mouth daily before lunch.    TRAMADOL (ULTRAM) 50 MG TABLET    Take 0.5 tablets (25 mg total) by mouth every 6 (six) hours as needed (pain).  Modified Medications   No medications on file  Discontinued Medications   FEEDING SUPPLEMENT (BOOST / RESOURCE BREEZE) LIQD    Take 1 Container by mouth 2 (two) times daily between meals.     Allergies  Allergen Reactions  . Amiodarone Other (See Comments)    Hyperthyroidism   . Diltiazem Other (See Comments)    Put into hospital for 3 days  . Macrobid [Nitrofurantoin Monohyd Macro] Nausea Only  . Aspirin Rash    Tolerates low dose aspirin  . Naproxen Sodium Swelling and Rash  . Sulfa Drugs Cross Reactors Other (See Comments)    Allergy per mother - unknown reaction     REVIEW  OF SYSTEMS:  GENERAL: no change in appetite, no fatigue, no weight changes, no fever, chills or weakness EYES: Denies change in vision, dry eyes, eye pain, itching or discharge EARS: Denies change in hearing, ringing in ears, or earache NOSE: Denies nasal congestion or epistaxis MOUTH and THROAT: Denies oral discomfort, gingival pain or bleeding, pain from teeth or hoarseness   RESPIRATORY: no cough, SOB, DOE, wheezing, hemoptysis CARDIAC: no  chest pain, or palpitations GI: no abdominal pain, diarrhea, heart burn, nausea or vomiting, +constipation GU: Denies dysuria, frequency, hematuria, incontinence, or discharge PSYCHIATRIC: Denies feeling of depression or anxiety. No report of hallucinations, insomnia, paranoia, or agitation    PHYSICAL EXAMINATION  GENERAL APPEARANCE: Well nourished. In no acute distress. Normal body habitus SKIN:  Has 3 laparoscopic sites on abdomen, with steri-strips, dry and no redness HEAD: Normal in size and contour. No evidence of trauma EYES: Lids open and close normally. No blepharitis, entropion or ectropion. PERRL. Conjunctivae are clear and sclerae are white. Lenses are without opacity EARS: Pinnae are normal. Patient hears normal voice tunes of the examiner MOUTH and THROAT: Lips are without lesions. Oral mucosa is moist and without lesions. Tongue is normal in shape, size, and color and without lesions NECK: supple, trachea midline, no neck masses, no thyroid tenderness, no thyromegaly LYMPHATICS: no LAN in the neck, no supraclavicular LAN RESPIRATORY: breathing is even & unlabored, BS CTAB CARDIAC: RRR, no murmur,no extra heart sounds, RLE 1+ edema GI: abdomen soft, normal BS, no masses, no tenderness, no hepatomegaly, no splenomegaly EXTREMITIES:  Able to move 4 extremities PSYCHIATRIC: Alert and oriented X 3. Affect and behavior are appropriate    LABS/RADIOLOGY: Labs reviewed: Basic Metabolic Panel:  Recent Labs  54/27/06 0413  05/21/16 0352  05/23/16 0429 05/24/16 0231 05/25/16 0540 05/26/16 0515  NA 137 139  < > 134* 137 135 133*  K 3.0* 3.0*  < > 4.1 3.2* 3.9 4.3  CL 101 102  < > 104 104 106 103  CO2 27 30  < > 25 27 24 25   GLUCOSE 163* 106*  < > 150* 116* 116* 103*  BUN 11 13  < > 9 5* <5* <5*  CREATININE 0.66 0.76  < > 0.61 0.62 0.59 0.61  CALCIUM 9.8 9.5  < > 8.8* 8.8* 8.8* 9.3  MG 1.7 2.2  --  1.6*  --   --   --   < > = values in this interval not displayed. Liver Function Tests:  Recent Labs  07/15/15 1845 11/07/15 0718 05/15/16 1713  AST 23 19 24   ALT 16 14 21   ALKPHOS 70 68 72  BILITOT 0.4 1.1 0.7  PROT 6.5 6.7 6.6  ALBUMIN 3.8 3.7 4.0    Recent Labs  05/15/16 1713  LIPASE 24   CBC:  Recent Labs  07/15/15 1845  11/07/15 0718 05/15/16 1713  05/24/16 0231 05/25/16 0540 05/26/16 0515  WBC 7.2  < > 8.2 10.2  < > 8.0 8.0 8.5  NEUTROABS 4.3  --  6.9 7.8*  --   --   --   --   HGB 11.5*  < > 12.2 12.8  < > 9.7* 9.7* 10.5*  HCT 35.5*  < > 37.4 38.6  < > 29.6* 29.8* 32.0*  MCV 88.3  < > 88.6 88.3  < > 88.6 87.6 88.2  PLT 214  < > 206 209  < > 218 230 266  < > = values in this interval not displayed. Lipid Panel:  Recent Labs  07/16/15 0557  HDL 45   Cardiac Enzymes:  Recent Labs  07/16/15 0557 05/16/16 0139 05/16/16 0711  TROPONINI <0.03 <0.03 <0.03   CBG:  Recent Labs  05/24/16 0646 05/25/16 0557 05/26/16 0538  GLUCAP 135* 94 116*      Dg Abd 1 View  Result Date: 05/17/2016 CLINICAL DATA:  76 year old female with right lower quadrant pain, nausea,  vomiting and constipation. History of diverticulosis. Subsequent encounter. EXAM: ABDOMEN - 1 VIEW COMPARISON:  05/16/2016 plain film exam.  05/15/2016 CT. FINDINGS: Nasogastric tube has been removed. Progressive increase in number of gas distended small bowel loops with slightly thickened folds consistent with small bowel obstruction. Stool within the right colon. The possibility of free intraperitoneal air  cannot be assessed on a supine view. Mild scoliosis lumbar spine convex right possibly related to splinting. IMPRESSION: Nasogastric tube has been removed. Progressive increase in number of gas distended small bowel loops with slightly thickened folds consistent with small bowel obstruction. Electronically Signed   By: Lacy Duverney M.D.   On: 05/17/2016 09:35   Ct Abdomen Pelvis W Contrast  Result Date: 05/15/2016 CLINICAL DATA:  76 y/o F; abdominal pain radiating to the back with vomiting. EXAM: CT ABDOMEN AND PELVIS WITH CONTRAST TECHNIQUE: Multidetector CT imaging of the abdomen and pelvis was performed using the standard protocol following bolus administration of intravenous contrast. CONTRAST:  1 ISOVUE-300 IOPAMIDOL (ISOVUE-300) INJECTION 61% COMPARISON:  09/22/2014 CT abdomen and pelvis FINDINGS: Lower chest: Cardiomegaly with biatrial enlargement. Mitral annuloplasty. Mild aortic valvular calcification. Hepatobiliary: Focal fat along falciform ligament. Normal gallbladder. No intra or extrahepatic biliary ductal dilatation. Pancreas: Unremarkable. No pancreatic ductal dilatation or surrounding inflammatory changes. Spleen: Punctate posterior calcification is stable compatible sequelae of prior granulomatous disease. Adrenals/Urinary Tract: Bilateral renal cysts largest on the right extending inferiorly exophytic leak from the right kidney lower pole, decreased since from prior study. Normal adrenal glands. No urinary stone disease or obstructive uropathy. Normal bladder. Stomach/Bowel: Small bowel obstruction in the left hemiabdomen with transition point in left mid abdomen (series 5, image 24). There appears to be several loops of collapsed bowel that are edematous with small surrounding small volume of ascites in the left upper quadrant (series 5, image 39) and there appears to be afferent and effent limbs passing through the same region with mesentery suggesting an internal hernia (series 6, image  71). Diverticulosis without diverticulitis. Vascular/Lymphatic: Surgical clips in the right groin likely related to prior vascular access. Severe aortic atherosclerosis with concentric calcification no aortic aneurysm. No lymphadenopathy identified. Reproductive: Uterus and bilateral adnexa are unremarkable. Other: Trace volume of pelvic ascites. Musculoskeletal: Lumbar degenerative changes with prominent lower lumbar facet arthropathy. No acute osseous abnormality. IMPRESSION: 1. Small bowel obstruction with transition point in the left mid abdomen. At the point of transition there are findings suggesting internal hernia of small bowel into the left posterior upper quadrant. 2. Cardiomegaly. 3. Aortic atherosclerosis. These results will be called to the ordering clinician or representative by the Radiologist Assistant, and communication documented in the PACS or zVision Dashboard. Electronically Signed   By: Mitzi Hansen M.D.   On: 05/15/2016 22:44   Dg Abd 2 Views  Result Date: 05/18/2016 CLINICAL DATA:  76 year old female with small bowel obstruction. Subsequent encounter. EXAM: ABDOMEN - 2 VIEW COMPARISON:  05/17/2016 FINDINGS: Persistent gas distended small bowel loops with thickened folds consistent with small bowel obstruction without improvement. No free air. Stool right colon. IMPRESSION: Persistent high-grade small bowel obstruction. Electronically Signed   By: Lacy Duverney M.D.   On: 05/18/2016 13:23   Dg Abd Portable 1v  Result Date: 05/22/2016 CLINICAL DATA:  Initial evaluation for NG tube placement. EXAM: PORTABLE ABDOMEN - 1 VIEW COMPARISON:  Prior radiograph from 05/20/2016. FINDINGS: Tip of an enteric to overlies the gastric body, projecting inferiorly. Side hole partially visualized, likely just above the GE junction. Persistent dilated loops  of gas-filled small bowel seen within the lower abdomen, measuring up to 4.9 cm, similar to previous. Retained enteric contrast material  within the colon. IMPRESSION: 1. Tip of enteric tube overlying the gastric body, side hole likely just above the GE junction. 2. Dilated gas-filled loops of small bowel, similar to previous, and concerning for persistent small bowel obstruction. Enteric contrast material is now seen within the colon. Electronically Signed   By: Rise Mu M.D.   On: 05/22/2016 21:06   Dg Abd Portable 1v  Result Date: 05/20/2016 CLINICAL DATA:  Follow-up small bowel obstruction. Initial encounter. EXAM: PORTABLE ABDOMEN - 1 VIEW COMPARISON:  Abdominal radiograph performed 05/20/2016 FINDINGS: There is persistent dilatation of small-bowel loops to 4.9 cm in diameter, concerning for relatively high-grade small bowel obstruction. Contrast is seen within the small bowel. No free intra-abdominal air is identified, though evaluation for free air is limited on a single supine view. Stool is noted within the ascending colon. The visualized osseous structures are within normal limits; the sacroiliac joints are unremarkable in appearance. The visualized lung bases are essentially clear. The patient's enteric tube is noted ending overlying the fundus of the stomach, with the side port about the distal esophagus. IMPRESSION: Persistent dilatation of small-bowel loops to 4.9 cm in diameter, concerning for relatively high-grade small bowel obstruction. Contrast remains within the small bowel. No free intra-abdominal air seen. Electronically Signed   By: Roanna Raider M.D.   On: 05/20/2016 20:51   Dg Abd Portable 1v  Result Date: 05/20/2016 CLINICAL DATA:  NG tube placement, history of small-bowel obstruction EXAM: PORTABLE ABDOMEN - 1 VIEW COMPARISON:  KUB of 05/19/2016 and CT abdomen and pelvis of 05/15/2016 FINDINGS: The tip of the NG tube overlies the distal esophagus and needs to be advanced by approximately 10 to 15 cm into the stomach. There is persistent dilatation of small bowel consistent with small bowel obstruction.  The stomach appears fluid filled. IMPRESSION: NG tube tip within the distal esophagus and needs to be advanced at least 10 cm. Little change in small bowel obstructive pattern. Electronically Signed   By: Dwyane Dee M.D.   On: 05/20/2016 12:44   Dg Abd Portable 1v-small Bowel Obstruction Protocol-initial, 8 Hr Delay  Result Date: 05/19/2016 CLINICAL DATA:  Small bowel obstruction. EXAM: PORTABLE ABDOMEN - 1 VIEW COMPARISON:  Earlier the same day. FINDINGS: AP supine portable study at 2031 hours shows contrast material in the stomach and proximal small bowel. Small bowel loops remain dilated with gas filled with left abdominal loop measuring up to 5.4 cm diameter. No evidence for contrast material in the colon. IMPRESSION: Imaging features remain consistent with small bowel obstruction. Electronically Signed   By: Kennith Center M.D.   On: 05/19/2016 22:28   Dg Abd Portable 1v  Result Date: 05/16/2016 CLINICAL DATA:  Small bowel obstruction.  Evaluate NG tube. EXAM: PORTABLE ABDOMEN - 1 VIEW COMPARISON:  CT scan from yesterday FINDINGS: An NG tube terminates in the stomach. A mildly prominent air-filled loop of small bowel in the left pelvis measures 3.2 cm. The bowel gas pattern is otherwise unremarkable. Contrast is seen the bladder consistent with yesterday's CT scan. Oval high attenuation in the right upper abdomen is probably in the right renal collecting system. No other acute abnormalities. IMPRESSION: Appropriate placement of NG tube.  Improving bowel gas pattern. Electronically Signed   By: Gerome Sam III M.D   On: 05/16/2016 10:15    ASSESSMENT/PLAN:  Physical deconditioning - for rehabilitation,  PT and OT, for therapeutic strengthening exercises; fall precautions  Small bowel obstruction - S/P exploratory laparoscopy with lysis of adhesion band on 05/21/16; continue mechanical soft diet until seen by Dr. Derrell Lolling, general surgery, in 2 weeks; continue tramadol 50 mg 1/2 tab by mouth every  6 hours and acetaminophen 500 mg 1 tab by mouth every 6 hours when necessary for pain  Anxiety - mood this is stable; continue alprazolam 0.25 mg 1/2 tab = 0.125 mg by mouth twice a day when necessary and stool weeks  Allergic rhinitis -continue Flonase 50 g administered 2 sprays into nostrils daily when necessary  GERD - continue pantoprazole 40 mg 1 tab by mouth daily  Hyperthyroidism - continue Methimazole 5 mg 1 tab by mouth daily Lab Results  Component Value Date   TSH 2.449 05/15/2016   Hypertension - continue Coreg 3.125 mg 1 tab by mouth twice a day  Anemia, acute blood loss -  check CBC on 05/31/16 Lab Results  Component Value Date   HGB 10.5 (L) 05/26/2016   Hyponatremia - Na 133 ; check BMP on 05/31/16  Constipation - start senna S 8.6-50 mg 2 tabs by mouth twice a day and MiraLAX 17 g by mouth twice a day   Atrial fibrillation - improved after IV fluids, magnesium and potassium were replaced; continue Coreg; check magnesium level on 05/31/16     Goals of care:  Short-term rehabilitation    Khayree Delellis C. Medina-Vargas - NP [employer] [phone]

## 2016-07-09 DIAGNOSIS — Z681 Body mass index (BMI) 19 or less, adult: Secondary | ICD-10-CM | POA: Diagnosis not present

## 2016-07-09 DIAGNOSIS — R10817 Generalized abdominal tenderness: Secondary | ICD-10-CM | POA: Diagnosis not present

## 2016-07-09 DIAGNOSIS — R1084 Generalized abdominal pain: Secondary | ICD-10-CM | POA: Diagnosis not present

## 2016-07-13 ENCOUNTER — Telehealth: Payer: Self-pay

## 2016-07-13 ENCOUNTER — Encounter: Payer: Self-pay | Admitting: Physician Assistant

## 2016-07-13 ENCOUNTER — Ambulatory Visit (INDEPENDENT_AMBULATORY_CARE_PROVIDER_SITE_OTHER): Payer: PPO | Admitting: Physician Assistant

## 2016-07-13 ENCOUNTER — Telehealth: Payer: Self-pay | Admitting: Physician Assistant

## 2016-07-13 ENCOUNTER — Other Ambulatory Visit (INDEPENDENT_AMBULATORY_CARE_PROVIDER_SITE_OTHER): Payer: Self-pay

## 2016-07-13 VITALS — BP 130/60 | HR 80 | Ht 66.0 in | Wt 108.0 lb

## 2016-07-13 DIAGNOSIS — K56609 Unspecified intestinal obstruction, unspecified as to partial versus complete obstruction: Secondary | ICD-10-CM

## 2016-07-13 DIAGNOSIS — R109 Unspecified abdominal pain: Secondary | ICD-10-CM

## 2016-07-13 DIAGNOSIS — R1032 Left lower quadrant pain: Secondary | ICD-10-CM | POA: Diagnosis not present

## 2016-07-13 LAB — BASIC METABOLIC PANEL
BUN: 12 mg/dL (ref 6–23)
CHLORIDE: 102 meq/L (ref 96–112)
CO2: 30 mEq/L (ref 19–32)
Calcium: 10.4 mg/dL (ref 8.4–10.5)
Creatinine, Ser: 0.67 mg/dL (ref 0.40–1.20)
GFR: 91.26 mL/min (ref >60.00)
Glucose, Bld: 112 mg/dL — ABNORMAL HIGH (ref 70–99)
POTASSIUM: 4.5 meq/L (ref 3.5–5.1)
SODIUM: 137 meq/L (ref 135–145)

## 2016-07-13 MED ORDER — CIPROFLOXACIN HCL 500 MG PO TABS: 500.0000 mg | ORAL_TABLET | Freq: Two times a day (BID) | ORAL | 0 refills | Status: DC

## 2016-07-13 MED ORDER — METRONIDAZOLE 500 MG PO TABS: 500.0000 mg | ORAL_TABLET | Freq: Two times a day (BID) | ORAL | 0 refills | Status: DC

## 2016-07-13 NOTE — Telephone Encounter (Signed)
Spoke with patient and explained that we needed to reschedule her CT to give her insurance company time to approve it.  New appointment is 07/29/2016 at 2:00pm.  I told her that Amy went ahead and prescribed Cipro and Flagyl for her to start in case she has diverticulitis.  I told her to call in the next week and if she was feeling better, we might be able to cancel the CT.  Patient agreed.

## 2016-07-13 NOTE — Progress Notes (Signed)
Subjective:    Patient ID: [name] [name], female    DOB: 29-Aug-1940, 76 y.o.   MRN: [id number]  HPI  [name] [name] is a pleasant 76 year old white female, referred by Dr. Reynaldo Minium for evaluation of abdominal pain. She is known to Dr. Delfin Edis previously and was last seen here in 2013. Patient has family history of colon cancer, had undergone colonoscopy last in October 2013, no polyps but did have moderate left-sided diverticulosis and small internal hemorrhoids. She was hospitalized in late January 2018 with an acute small bowel obstruction and underwent laparoscopic lysis of adhesions per Dr. Rosendo Gros with finding of an internal hernia in the mid jejunum associated with an adhesive omental band. She says she has been recuperating from that and has been released by Dr. Rosendo Gros. He says she has had a significant increase in belching and burping over the past couple of weeks, she has not been vomiting and denies nausea. She's also been having a lot of flatus and now over the past week or so has developed intermittent sharp upper abdominal pains and some pains in the left lower abdomen. She was seen at Dr. Jacquiline Doe office last week and had CBC done which was unremarkable. She's not had any imaging. She has had some increase in discomfort this week, denies any fever. Her bowel movements have been fairly regular with no melena or hematochezia. She is very concerned because she is scheduled to have oral surgery on Friday of this week. She has had prior diverticulitis and says this discomfort is similar. Other medical problems include hypertension, atrial fib flutter status post previous mitral valve repair and history of bigeminy.  Review of Systems Pertinent positive and negative review of systems were noted in the above HPI section.  All other review of systems was otherwise negative.  Outpatient Encounter Prescriptions as of 07/13/2016  Medication Sig  . acetaminophen (TYLENOL) 500 MG tablet Take  500 mg by mouth every 6 (six) hours as needed (pain).   [name] ALPRAZolam (XANAX) 0.25 MG tablet Take 0.5-1 tablets (0.125-0.25 mg total) by mouth 2 (two) times daily as needed for anxiety. For anxiety  . Amino Acids-Protein Hydrolys (FEEDING SUPPLEMENT, PRO-STAT SUGAR FREE 64,) LIQD Take 30 mLs by mouth daily.  [name] aspirin EC 81 MG tablet Take 81 mg by mouth daily at 2 PM.   . b complex vitamins tablet Take 1 tablet by mouth daily before lunch.  . carvedilol (COREG) 3.125 MG tablet Take 1 tablet (3.125 mg total) by mouth 2 (two) times daily with a meal.  . Cholecalciferol (VITAMIN D3) 2000 UNITS TABS Take 2,000 Units by mouth daily before lunch.   . Cyanocobalamin (VITAMIN B-12 PO) Take 1,000 mcg by mouth daily before lunch.   . fluticasone (FLONASE) 50 MCG/ACT nasal spray Place 2 sprays into the nose daily as needed for allergies.   . methimazole (TAPAZOLE) 5 MG tablet Take 5 mg by mouth daily.   [name] NUTRITIONAL SUPPLEMENT LIQD Take 90 mLs by mouth 2 (two) times daily between meals. [employer]  . Omega-3 Fatty Acids (FISH OIL PO) Take 1 capsule by mouth daily before lunch.  [name] OVER THE COUNTER MEDICATION Place 1 drop into both eyes daily as needed (dry eyes/ irritation). Artificial Tears  . pantoprazole (PROTONIX) 40 MG tablet Take 40 mg by mouth daily.   . Probiotic Product (PROBIOTIC DAILY) CAPS Take 250 mg by mouth daily before lunch.   . traMADol (ULTRAM) 50 MG tablet Take 0.5 tablets (25 mg total) by  mouth every 6 (six) hours as needed (pain).   No facility-administered encounter medications on file as of 07/13/2016.    Allergies  Allergen Reactions  . Amiodarone Other (See Comments)    Hyperthyroidism   . Diltiazem Other (See Comments)    Put into hospital for 3 days  . Macrobid [Nitrofurantoin Monohyd Macro] Nausea Only  . Aspirin Rash    Tolerates low dose aspirin  . Naproxen Sodium Swelling and Rash  . Sulfa Drugs Cross Reactors Other (See Comments)    Allergy per mother - unknown  reaction   Patient Active Problem List   Diagnosis Date Noted  . Malnutrition of moderate degree 05/17/2016  . Bigeminy   . Intractable vomiting 05/15/2016  . Heart palpitations 05/15/2016  . SBO (small bowel obstruction) 05/15/2016  . Acute coronary syndrome ([place]) 07/15/2015  . Status post Maze operation for atrial fibrillation 01/08/2013  . S/P MVR (mitral valve repair) 01/08/2013  . Atrial flutter ([place]) 10/30/2012  . Amaurosis fugax 10/30/2012  . Dyspnea 10/28/2012  . 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 Thyroid goiter 08/21/2012  . Microscopic hematuria 08/20/2012  . S/P mitral valve repair 06/28/2012  . History of mitral valve prolapse with severe mitral regurgtation 05/19/2012  . Refusal of blood transfusions as patient is Jehovah's Witness 05/19/2012  . Atrial fibrillation ([place]) 05/16/2012  . Anxiety 04/27/2011  . Hypertension 04/26/2011  . Diverticulitis 04/24/2011  . Hyperlipidemia    Social History   Social History  . Marital status: Divorced    Spouse name: N/A  . Number of children: N/A  . Years of education: N/A   Occupational History  . Not on file.   Social History Main Topics  . Smoking status: Never Smoker  . Smokeless tobacco: Never Used  . Alcohol use No     Comment: 1 drink a month  . Drug use: No  . Sexual activity: No   Other Topics Concern  . Not on file   Social History Narrative   Divorced - lives alone - remains functionally independent.  Has 3 children.    [name] [name] family history includes Colon cancer in her father.      Objective:    Vitals:   07/13/16 1044  BP: 130/60  Pulse: 80    Physical Exam  well-developed thin older white female in no acute distress, talkative and somewhat anxious, blood pressure 130/60, pulse 80, height 5 foot 6, weight 108, BMI 17.4 HEENT; nontraumatic normocephalic EOMI PERRLA sclera anicteric, Cardiovascular; regular rate and rhythm with S1-S2 no murmur or gallop, Pulmonary ;clear  bilaterally, Abdomen; flat soft she has some mild generalized tenderness but is more markedly tender in the left mid quadrant and left lower quadrant there is no guarding or rebound no palpable mass or hepatosplenomegaly, bowel sounds are present, Rectal ;exam not done, Ext; no clubbing cyanosis or edema skin warm and dry, Neuropsych; mood and affect appropriate       Assessment & Plan:   #1 76 yo female status post lap scopic lysis of adhesions 05/21/2016 for small bowel obstruction with finding of an internal hernia in the mid jejunum who now presents with ongoing complaints of both upper and left-sided abdominal pain over the past week and a half. Suspect she may have diverticulitis, need to rule out recurrent partial obstruction or other postop complication. #2 persistent belching and burping despite chronic PPI use #3 family history of colon cancer-due for follow-up colonoscopy 2018 #4 previously documented diverticulosis #5 history of atrial  fib/flutter #6 status post mitral valve repair #7 history of bigeminy #8 hypertension  Plan; CBC normal on 07/09/2016 will not repeat today, check be met Schedule for CT of the abdomen and pelvis with contrast tomorrow. Soft bland diet Increase Protonix to 40 mg by mouth twice a day 2 weeks She  will eventually need colonoscopy later this year after acute problems sorted out and resolved The patient will be established with Dr. Loletha Carrow.   Addendum; patient's insurance does not authorize CT scans without a one-week process. We'll go ahead and start her on Cipro 500 by mouth twice a day and Flagyl 500 mg by mouth twice a day both 10 days. Will ask her to call back early next week with a progress report, if she is clearly better  Will  cancel CT scan - if symptoms are persisting then will proceed with CT abdomen and pelvis.    [name] Sosinski S Modene Andy PA-C 07/13/2016   Cc: Burnard Bunting, MD

## 2016-07-13 NOTE — Telephone Encounter (Signed)
Called again and got an answering maching - left message.  Then called son and he is going to try to get her to call me back so I can let her know that her CT scheduled for tomorrow has been rescheduled.

## 2016-07-13 NOTE — Progress Notes (Signed)
Thank you for sending this case to me. I have reviewed the entire note, and the outlined plan seems appropriate.   Henry Danis, MD  

## 2016-07-13 NOTE — Telephone Encounter (Signed)
Called patient to tell her that we would need to move her CT due to her insurance - no answer no voicemail.

## 2016-07-13 NOTE — Patient Instructions (Signed)
Your physician has requested that you go to the basement for the following lab work before leaving today:  BMET   Increase your Protonix to twice a day for 2 weeks.  Eat a soft diet  You have been scheduled for a CT scan of the abdomen and pelvis at [facility] ([address]---this is in the same building as [facility]).   You are scheduled on 07/14/2016 at 3:00pm. You should arrive 15 minutes prior to your appointment time for registration. Please follow the written instructions below on the day of your exam:  WARNING: IF YOU ARE ALLERGIC TO IODINE/X-RAY DYE, PLEASE NOTIFY RADIOLOGY IMMEDIATELY AT [phone]! YOU WILL BE GIVEN A 13 HOUR PREMEDICATION PREP.  1) Do not eat or drink anything after 11:00am (4 hours prior to your test) 2) You have been given 2 bottles of oral contrast to drink. The solution may taste               better if refrigerated, but do NOT add ice or any other liquid to this solution. Shake well before drinking.    Drink 1 bottle of contrast @ 1:00pm (2 hours prior to your exam)  Drink 1 bottle of contrast @ 2:00pm (1 hour prior to your exam)  You may take any medications as prescribed with a small amount of water except for the following: Metformin, Glucophage, Glucovance, Avandamet, Riomet, Fortamet, Actoplus Met, Janumet, Glumetza or Metaglip. The above medications must be held the day of the exam AND 48 hours after the exam.  The purpose of you drinking the oral contrast is to aid in the visualization of your intestinal tract. The contrast solution may cause some diarrhea. Before your exam is started, you will be given a small amount of fluid to drink. Depending on your individual set of symptoms, you may also receive an intravenous injection of x-ray contrast/dye. Plan on being at [facility] for 30 minutes or long, depending on the type of exam you are having performed.  If you have any questions regarding your exam or if you need to  reschedule, you may call the CT department at [phone] between the hours of 8:00 am and 5:00 pm, Monday-Friday.  ________________________________________________________________________

## 2016-07-14 ENCOUNTER — Inpatient Hospital Stay: Admission: RE | Admit: 2016-07-14 | Payer: Self-pay | Source: Ambulatory Visit

## 2016-07-14 MED ORDER — METRONIDAZOLE 500 MG PO TABS: 500.0000 mg | ORAL_TABLET | Freq: Two times a day (BID) | ORAL | 0 refills | Status: DC

## 2016-07-14 MED ORDER — CIPROFLOXACIN HCL 500 MG PO TABS: 500.0000 mg | ORAL_TABLET | Freq: Two times a day (BID) | ORAL | 0 refills | Status: DC

## 2016-07-14 NOTE — Telephone Encounter (Signed)
Cipro and Flagyl sent to [facility]

## 2016-07-29 ENCOUNTER — Telehealth: Payer: Self-pay

## 2016-07-29 ENCOUNTER — Inpatient Hospital Stay: Admission: RE | Admit: 2016-07-29 | Payer: Self-pay | Source: Ambulatory Visit

## 2016-07-29 NOTE — Telephone Encounter (Signed)
[name] [name] called and said she is feeling better and does not want to have her CT Abdomen and Pelvis at this time.

## 2016-07-30 NOTE — Telephone Encounter (Signed)
Ok, that's fine.

## 2016-09-03 DIAGNOSIS — M9905 Segmental and somatic dysfunction of pelvic region: Secondary | ICD-10-CM | POA: Diagnosis not present

## 2016-09-07 DIAGNOSIS — M9905 Segmental and somatic dysfunction of pelvic region: Secondary | ICD-10-CM | POA: Diagnosis not present

## 2016-09-22 DIAGNOSIS — T82857A Stenosis of cardiac prosthetic devices, implants and grafts, initial encounter: Secondary | ICD-10-CM | POA: Diagnosis not present

## 2016-09-22 DIAGNOSIS — Z9889 Other specified postprocedural states: Secondary | ICD-10-CM | POA: Diagnosis not present

## 2016-09-22 DIAGNOSIS — R9431 Abnormal electrocardiogram [ECG] [EKG]: Secondary | ICD-10-CM | POA: Diagnosis not present

## 2016-09-22 DIAGNOSIS — R0602 Shortness of breath: Secondary | ICD-10-CM | POA: Diagnosis not present

## 2016-09-29 DIAGNOSIS — M545 Low back pain: Secondary | ICD-10-CM | POA: Diagnosis not present

## 2016-09-29 DIAGNOSIS — I480 Paroxysmal atrial fibrillation: Secondary | ICD-10-CM | POA: Diagnosis not present

## 2016-09-29 DIAGNOSIS — R8290 Unspecified abnormal findings in urine: Secondary | ICD-10-CM | POA: Diagnosis not present

## 2016-09-29 DIAGNOSIS — M810 Age-related osteoporosis without current pathological fracture: Secondary | ICD-10-CM | POA: Diagnosis not present

## 2016-09-29 DIAGNOSIS — I10 Essential (primary) hypertension: Secondary | ICD-10-CM | POA: Diagnosis not present

## 2016-09-29 DIAGNOSIS — I341 Nonrheumatic mitral (valve) prolapse: Secondary | ICD-10-CM | POA: Diagnosis not present

## 2016-09-29 DIAGNOSIS — I5020 Unspecified systolic (congestive) heart failure: Secondary | ICD-10-CM | POA: Diagnosis not present

## 2016-09-29 DIAGNOSIS — Z681 Body mass index (BMI) 19 or less, adult: Secondary | ICD-10-CM | POA: Diagnosis not present

## 2016-09-29 DIAGNOSIS — Z1389 Encounter for screening for other disorder: Secondary | ICD-10-CM | POA: Diagnosis not present

## 2016-09-29 DIAGNOSIS — F419 Anxiety disorder, unspecified: Secondary | ICD-10-CM | POA: Diagnosis not present

## 2016-09-29 DIAGNOSIS — F1320 Sedative, hypnotic or anxiolytic dependence, uncomplicated: Secondary | ICD-10-CM | POA: Diagnosis not present

## 2016-09-29 DIAGNOSIS — E784 Other hyperlipidemia: Secondary | ICD-10-CM | POA: Diagnosis not present

## 2017-02-05 DIAGNOSIS — Z23 Encounter for immunization: Secondary | ICD-10-CM | POA: Diagnosis not present

## 2017-03-30 DIAGNOSIS — R82998 Other abnormal findings in urine: Secondary | ICD-10-CM | POA: Diagnosis not present

## 2017-03-30 DIAGNOSIS — E0590 Thyrotoxicosis, unspecified without thyrotoxic crisis or storm: Secondary | ICD-10-CM | POA: Diagnosis not present

## 2017-03-30 DIAGNOSIS — I10 Essential (primary) hypertension: Secondary | ICD-10-CM | POA: Diagnosis not present

## 2017-03-30 DIAGNOSIS — M810 Age-related osteoporosis without current pathological fracture: Secondary | ICD-10-CM | POA: Diagnosis not present

## 2017-03-30 DIAGNOSIS — E7849 Other hyperlipidemia: Secondary | ICD-10-CM | POA: Diagnosis not present

## 2017-04-06 DIAGNOSIS — I10 Essential (primary) hypertension: Secondary | ICD-10-CM | POA: Diagnosis not present

## 2017-04-06 DIAGNOSIS — I5020 Unspecified systolic (congestive) heart failure: Secondary | ICD-10-CM | POA: Diagnosis not present

## 2017-04-06 DIAGNOSIS — M545 Low back pain: Secondary | ICD-10-CM | POA: Diagnosis not present

## 2017-04-06 DIAGNOSIS — I480 Paroxysmal atrial fibrillation: Secondary | ICD-10-CM | POA: Diagnosis not present

## 2017-04-06 DIAGNOSIS — F419 Anxiety disorder, unspecified: Secondary | ICD-10-CM | POA: Diagnosis not present

## 2017-04-06 DIAGNOSIS — Z0000 Encounter for general adult medical examination without abnormal findings: Secondary | ICD-10-CM | POA: Diagnosis not present

## 2017-04-06 DIAGNOSIS — E7849 Other hyperlipidemia: Secondary | ICD-10-CM | POA: Diagnosis not present

## 2017-04-06 DIAGNOSIS — E042 Nontoxic multinodular goiter: Secondary | ICD-10-CM | POA: Diagnosis not present

## 2017-04-06 DIAGNOSIS — M810 Age-related osteoporosis without current pathological fracture: Secondary | ICD-10-CM | POA: Diagnosis not present

## 2017-04-06 DIAGNOSIS — I341 Nonrheumatic mitral (valve) prolapse: Secondary | ICD-10-CM | POA: Diagnosis not present

## 2017-04-06 DIAGNOSIS — F1320 Sedative, hypnotic or anxiolytic dependence, uncomplicated: Secondary | ICD-10-CM | POA: Diagnosis not present

## 2017-04-06 DIAGNOSIS — E0590 Thyrotoxicosis, unspecified without thyrotoxic crisis or storm: Secondary | ICD-10-CM | POA: Diagnosis not present

## 2017-04-28 ENCOUNTER — Telehealth: Payer: Self-pay | Admitting: Physician Assistant

## 2017-04-28 NOTE — Telephone Encounter (Signed)
Complaining of

## 2017-04-28 NOTE — Telephone Encounter (Signed)
Spoke with the patient about her complaint of constipation. She usually take Miralax daily. She uses MOM when needed. She is out of Miralax and has been for at least a week or more. Last 3 days she has had only small hard stools. She does not feel like she empties completely. She took MOM 2 nights ago.  Discussed Miralax purge, magnesium citrate and Fleets enemas. She opts to resume the Miralax at least 3 glasses today. She understands she may drink a dose of Miralax every 15 minutes up to 7 doses or until her bowels move. ER if she acutely worsens. Call back as needed.

## 2017-04-29 NOTE — Telephone Encounter (Signed)
agree

## 2017-05-17 DIAGNOSIS — I951 Orthostatic hypotension: Secondary | ICD-10-CM | POA: Diagnosis not present

## 2017-05-17 DIAGNOSIS — Z9889 Other specified postprocedural states: Secondary | ICD-10-CM | POA: Diagnosis not present

## 2017-05-17 DIAGNOSIS — I10 Essential (primary) hypertension: Secondary | ICD-10-CM | POA: Diagnosis not present

## 2017-05-17 DIAGNOSIS — R002 Palpitations: Secondary | ICD-10-CM | POA: Diagnosis not present

## 2017-06-01 DIAGNOSIS — R0602 Shortness of breath: Secondary | ICD-10-CM | POA: Diagnosis not present

## 2017-06-02 ENCOUNTER — Encounter: Payer: Self-pay | Admitting: Gastroenterology

## 2017-06-09 DIAGNOSIS — Z9889 Other specified postprocedural states: Secondary | ICD-10-CM | POA: Diagnosis not present

## 2017-06-09 DIAGNOSIS — R002 Palpitations: Secondary | ICD-10-CM | POA: Diagnosis not present

## 2017-06-09 DIAGNOSIS — I951 Orthostatic hypotension: Secondary | ICD-10-CM | POA: Diagnosis not present

## 2017-06-09 DIAGNOSIS — I428 Other cardiomyopathies: Secondary | ICD-10-CM | POA: Diagnosis not present

## 2017-06-09 DIAGNOSIS — I5042 Chronic combined systolic (congestive) and diastolic (congestive) heart failure: Secondary | ICD-10-CM | POA: Diagnosis not present

## 2017-06-20 DIAGNOSIS — I428 Other cardiomyopathies: Secondary | ICD-10-CM | POA: Diagnosis not present

## 2017-06-20 DIAGNOSIS — I493 Ventricular premature depolarization: Secondary | ICD-10-CM | POA: Diagnosis not present

## 2017-06-20 DIAGNOSIS — I5042 Chronic combined systolic (congestive) and diastolic (congestive) heart failure: Secondary | ICD-10-CM | POA: Diagnosis not present

## 2017-06-20 DIAGNOSIS — Z9889 Other specified postprocedural states: Secondary | ICD-10-CM | POA: Diagnosis not present

## 2017-07-29 ENCOUNTER — Encounter: Payer: Self-pay | Admitting: Cardiology

## 2017-07-29 DIAGNOSIS — I428 Other cardiomyopathies: Secondary | ICD-10-CM | POA: Diagnosis not present

## 2017-07-29 DIAGNOSIS — Z9889 Other specified postprocedural states: Secondary | ICD-10-CM | POA: Diagnosis not present

## 2017-07-29 DIAGNOSIS — I493 Ventricular premature depolarization: Secondary | ICD-10-CM | POA: Diagnosis not present

## 2017-07-29 DIAGNOSIS — I5042 Chronic combined systolic (congestive) and diastolic (congestive) heart failure: Secondary | ICD-10-CM | POA: Diagnosis not present

## 2017-08-01 DIAGNOSIS — I493 Ventricular premature depolarization: Secondary | ICD-10-CM | POA: Diagnosis not present

## 2017-08-05 ENCOUNTER — Encounter: Payer: Self-pay | Admitting: Cardiology

## 2017-08-16 ENCOUNTER — Ambulatory Visit (INDEPENDENT_AMBULATORY_CARE_PROVIDER_SITE_OTHER): Payer: PPO | Admitting: Cardiology

## 2017-08-16 ENCOUNTER — Encounter: Payer: Self-pay | Admitting: Cardiology

## 2017-08-16 VITALS — BP 120/80 | HR 111 | Ht 66.0 in | Wt 107.0 lb

## 2017-08-16 DIAGNOSIS — Z79899 Other long term (current) drug therapy: Secondary | ICD-10-CM | POA: Diagnosis not present

## 2017-08-16 DIAGNOSIS — I484 Atypical atrial flutter: Secondary | ICD-10-CM | POA: Diagnosis not present

## 2017-08-16 DIAGNOSIS — I480 Paroxysmal atrial fibrillation: Secondary | ICD-10-CM

## 2017-08-16 DIAGNOSIS — I493 Ventricular premature depolarization: Secondary | ICD-10-CM

## 2017-08-16 NOTE — Patient Instructions (Addendum)
Medication Instructions:  Your physician has recommended you make the following change in your medication: 1. START Xarelto -- nurse will call you on what dose to start after we get lab results.  Labwork: Today: BMET  Testing/Procedures: Your physician has recommended that you have a Cardioversion (DCCV) - Nurse will call you to arrange this procedure.. Electrical Cardioversion uses a jolt of electricity to your heart either through paddles or wired patches attached to your chest. This is a controlled, usually prescheduled, procedure. Defibrillation is done under light anesthesia in the hospital, and you usually go home the day of the procedure. This is done to get your heart back into a normal rhythm. You are not awake for the procedure.   Follow-Up: Follow up will be scheduled when nurse calls you to arrange cardioversion.  * If you need a refill on your cardiac medications before your next appointment, please call your pharmacy.   *Please note that any paperwork needing to be filled out by the provider will need to be addressed at the front desk prior to seeing the provider. Please note that any FMLA, disability or other documents regarding health condition is subject to a $25.00 charge that must be received prior to completion of paperwork in the form of a money order or check.  Thank you for choosing [facility]!!   [name] Horn, RN [phone]  Any Other Special Instructions Will Be Listed Below (If Applicable).  Rivaroxaban oral tablets What is this medicine? RIVAROXABAN (ri va ROX a ban) is an anticoagulant (blood thinner). It is used to treat blood clots in the lungs or in the veins. It is also used after knee or hip surgeries to prevent blood clots. It is also used to lower the chance of stroke in people with a medical condition called atrial fibrillation. This medicine may be used for other purposes; ask your health care provider or pharmacist if you have  questions. COMMON BRAND NAME(S): Xarelto, Xarelto Starter Pack What should I tell my health care provider before I take this medicine? They need to know if you have any of these conditions: -bleeding disorders -bleeding in the brain -blood in your stools (black or tarry stools) or if you have blood in your vomit -history of stomach bleeding -kidney disease -liver disease -low blood counts, like low white cell, platelet, or red cell counts -recent or planned spinal or epidural procedure -take medicines that treat or prevent blood clots -an unusual or allergic reaction to rivaroxaban, other medicines, foods, dyes, or preservatives -pregnant or trying to get pregnant -breast-feeding How should I use this medicine? Take this medicine by mouth with a glass of water. Follow the directions on the prescription label. Take your medicine at regular intervals. Do not take it more often than directed. Do not stop taking except on your doctor's advice. Stopping this medicine may increase your risk of a blood clot. Be sure to refill your prescription before you run out of medicine. If you are taking this medicine after hip or knee replacement surgery, take it with or without food. If you are taking this medicine for atrial fibrillation, take it with your evening meal. If you are taking this medicine to treat blood clots, take it with food at the same time each day. If you are unable to swallow your tablet, you may crush the tablet and mix it in applesauce. Then, immediately eat the applesauce. You should eat more food right after you eat the applesauce containing the crushed tablet.  Talk to your pediatrician regarding the use of this medicine in children. Special care may be needed. Overdosage: If you think you have taken too much of this medicine contact a poison control center or emergency room at once. NOTE: This medicine is only for you. Do not share this medicine with others. What if I miss a dose? If  you take your medicine once a day and miss a dose, take the missed dose as soon as you remember. If you take your medicine twice a day and miss a dose, take the missed dose immediately. In this instance, 2 tablets may be taken at the same time. The next day you should take 1 tablet twice a day as directed. What may interact with this medicine? Do not take this medicine with any of the following medications: -defibrotide This medicine may also interact with the following medications: -aspirin and aspirin-like medicines -certain antibiotics like erythromycin, azithromycin, and clarithromycin -certain medicines for fungal infections like ketoconazole and itraconazole -certain medicines for irregular heart beat like amiodarone, quinidine, dronedarone -certain medicines for seizures like carbamazepine, phenytoin -certain medicines that treat or prevent blood clots like warfarin, enoxaparin, and dalteparin -conivaptan -diltiazem -felodipine -indinavir -lopinavir; ritonavir -NSAIDS, medicines for pain and inflammation, like ibuprofen or naproxen -ranolazine -rifampin -ritonavir -SNRIs, medicines for depression, like desvenlafaxine, duloxetine, levomilnacipran, venlafaxine -SSRIs, medicines for depression, like citalopram, escitalopram, fluoxetine, fluvoxamine, paroxetine, sertraline -St. John's wort -verapamil This list may not describe all possible interactions. Give your health care provider a list of all the medicines, herbs, non-prescription drugs, or dietary supplements you use. Also tell them if you smoke, drink alcohol, or use illegal drugs. Some items may interact with your medicine. What should I watch for while using this medicine? Visit your doctor or health care professional for regular checks on your progress. Notify your doctor or health care professional and seek emergency treatment if you develop breathing problems; changes in vision; chest pain; severe, sudden headache; pain,  swelling, warmth in the leg; trouble speaking; sudden numbness or weakness of the face, arm or leg. These can be signs that your condition has gotten worse. If you are going to have surgery or other procedure, tell your doctor that you are taking this medicine. What side effects may I notice from receiving this medicine? Side effects that you should report to your doctor or health care professional as soon as possible: -allergic reactions like skin rash, itching or hives, swelling of the face, lips, or tongue -back pain -redness, blistering, peeling or loosening of the skin, including inside the mouth -signs and symptoms of bleeding such as bloody or black, tarry stools; red or dark-brown urine; spitting up blood or brown material that looks like coffee grounds; red spots on the skin; unusual bruising or bleeding from the eye, gums, or nose Side effects that usually do not require medical attention (report to your doctor or health care professional if they continue or are bothersome): -dizziness -muscle pain This list may not describe all possible side effects. Call your doctor for medical advice about side effects. You may report side effects to FDA at 1-800-FDA-1088. Where should I keep my medicine? Keep out of the reach of children. Store at room temperature between 15 and 30 degrees C (59 and 86 degrees F). Throw away any unused medicine after the expiration date. NOTE: This sheet is a summary. It may not cover all possible information. If you have questions about this medicine, talk to your doctor, pharmacist, or health care provider.  2018 Elsevier/Gold Standard (2015-12-31 16:29:33)    Electrical Cardioversion Electrical cardioversion is the delivery of a jolt of electricity to restore a normal rhythm to the heart. A rhythm that is too fast or is not regular keeps the heart from pumping well. In this procedure, sticky patches or metal paddles are placed on the chest to deliver  electricity to the heart from a device. This procedure may be done in an emergency if:  There is low or no blood pressure as a result of the heart rhythm.  Normal rhythm must be restored as fast as possible to protect the brain and heart from further damage.  It may save a life.  This procedure may also be done for irregular or fast heart rhythms that are not immediately life-threatening. Tell a health care provider about:  Any allergies you have.  All medicines you are taking, including vitamins, herbs, eye drops, creams, and over-the-counter medicines.  Any problems you or family members have had with anesthetic medicines.  Any blood disorders you have.  Any surgeries you have had.  Any medical conditions you have.  Whether you are pregnant or may be pregnant. What are the risks? Generally, this is a safe procedure. However, problems may occur, including:  Allergic reactions to medicines.  A blood clot that breaks free and travels to other parts of your body.  The possible return of an abnormal heart rhythm within hours or days after the procedure.  Your heart stopping (cardiac arrest). This is rare.  What happens before the procedure? Medicines  Your health care provider may have you start taking: ? Blood-thinning medicines (anticoagulants) so your blood does not clot as easily. ? Medicines may be given to help stabilize your heart rate and rhythm.  Ask your health care provider about changing or stopping your regular medicines. This is especially important if you are taking diabetes medicines or blood thinners. General instructions  Plan to have someone take you home from the hospital or clinic.  If you will be going home right after the procedure, plan to have someone with you for 24 hours.  Follow instructions from your health care provider about eating or drinking restrictions. What happens during the procedure?  To lower your risk of infection: ? Your  health care team will wash or sanitize their hands. ? Your skin will be washed with soap.  An IV tube will be inserted into one of your veins.  You will be given a medicine to help you relax (sedative).  Sticky patches (electrodes) or metal paddles may be placed on your chest.  An electrical shock will be delivered. The procedure may vary among health care providers and hospitals. What happens after the procedure?  Your blood pressure, heart rate, breathing rate, and blood oxygen level will be monitored until the medicines you were given have worn off.  Do not drive for 24 hours if you were given a sedative.  Your heart rhythm will be watched to make sure it does not change. This information is not intended to replace advice given to you by your health care provider. Make sure you discuss any questions you have with your health care provider. Document Released: 04/02/2002 Document Revised: 12/10/2015 Document Reviewed: 10/17/2015 Elsevier Interactive Patient Education  2017 [employer].

## 2017-08-16 NOTE — Progress Notes (Signed)
Electrophysiology Office Note   Date:  08/16/2017   ID:  [name] [name], DOB [birth year], MRN [id number]  PCP:  Geoffry Paradise, MD  Cardiologist:  Jacinto Halim Primary Electrophysiologist:  Trygg Mantz Jorja Loa, MD    Chief Complaint  Patient presents with  . Advice Only    PVC's/Cardiomyopathy     History of Present Illness: [name] [name] is a 77 y.o. female who is being seen today for the evaluation of PVCs at the request of [name] [name]. Presenting today for electrophysiology evaluation.  She has a history of hyperlipidemia, hypertension, mitral valve repair and left atrial appendage clipping with maze in 2014.  She has not had atrial fibrillation since her operation.  Coronary angiogram May 2017 was normal.  She has a history of amiodarone induced thyroiditis.  She had an echo performed February 2019 with severe LV systolic dysfunction.  June 2017 showed no LV systolic dysfunction.  She was having 22% PVCs which were monomorphic.  She was put on Coreg which improved her symptoms.  Today, she denies symptoms of palpitations, chest pain, shortness of breath, orthopnea, PND, lower extremity edema, claudication, dizziness, presyncope, syncope, bleeding, or neurologic sequela. The patient is tolerating medications without difficulties.  Complaint is of weakness and fatigue.  She is not able to do all of her daily activities because of her symptoms.   Past Medical History:  Diagnosis Date  . Acute coronary syndrome (HCC) 06/2015  . 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 Anemia   . Anxiety   . Aortic stenosis 05/19/2012   trivial by TEE  . Atrial fibrillation (HCC)    a. Dx 04/2012;  b. Amio d/c'd 2/2 hyperthyroidism;  c. Atrial flutter 10/2012;  d. On xarelto.  [name]   . CHF (congestive heart failure) (HCC)   . Complication of anesthesia   . Coronary artery disease   . Diverticulitis   . GERD (gastroesophageal reflux disease)   . Goiter   . Headache   . Heart murmur     . Hyperlipidemia    recently- taken off Crestor- due to pain in her legs, but this was after cardiac cath. , pt. questioning whether the pain in her legs was related to cath. or crestor  . Hypertension    Then hypotension 04/2012 limiting med adjustment.  . Intractable vomiting   . Malnutrition of moderate degree (HCC)   . Microscopic hematuria 08/20/2012  . Mitral valve prolapse    a. H/o MVP, with severe MR 04/2012, had MV repair and Maze  . Pneumonia    treated /w antibiotic- [facility]- early Feb. 2014  . PONV (postoperative nausea and vomiting)   . Refusal of blood transfusions as patient is Jehovah's Witness   . S/P Maze operation for atrial fibrillation 06/28/2012   Complete bilateral atrial lesion set using cryothermy ablation with oversewing of LA appendage via right minithoracotomy  . S/P mitral valve repair 06/28/2012   Complex valvuloplasty including quadrangular resection of posterior leaflet, sliding leaflet plasty, artificial Gore-tex neocord placement x2 and 32mm Sorin Memo 3D ring annuloplasty via right mini thoracotomy   . SBO (small bowel obstruction) (HCC)   . Shortness of breath    Past Surgical History:  Procedure Laterality Date  . CARDIAC CATHETERIZATION N/A 07/16/2015   Procedure: Left Heart Cath and Coronary Angiography;  Surgeon: Orpah Cobb, MD;  Location: [facility] INVASIVE CV LAB;  Service: Cardiovascular;  Laterality: N/A;  . CORONARY ARTERY BYPASS GRAFT    . INTRAOPERATIVE TRANSESOPHAGEAL ECHOCARDIOGRAM N/A  06/28/2012   Procedure: INTRAOPERATIVE TRANSESOPHAGEAL ECHOCARDIOGRAM;  Surgeon: Purcell Nails, MD;  Location: [facility] OR;  Service: Open Heart Surgery;  Laterality: N/A;  . LAPAROSCOPY N/A 05/21/2016   Procedure: LAPAROSCOPY DIAGNOSTIC, LYSIS OF ADHESION BAND;  Surgeon: Axel Filler, MD;  Location: [facility] OR;  Service: General;  Laterality: N/A;  . LEFT AND RIGHT HEART CATHETERIZATION WITH CORONARY ANGIOGRAM N/A 05/22/2012   Procedure: LEFT AND RIGHT HEART CATHETERIZATION WITH  CORONARY ANGIOGRAM;  Surgeon: Kathleene Hazel, MD;  Location: [facility] CATH LAB;  Service: Cardiovascular;  Laterality: N/A;  . MINIMALLY INVASIVE MAZE PROCEDURE N/A 06/28/2012   Procedure: MINIMALLY INVASIVE MAZE PROCEDURE;  Surgeon: Purcell Nails, MD;  Location: [facility] OR;  Service: Open Heart Surgery;  Laterality: N/A;  . MITRAL VALVE REPAIR Right 06/28/2012   Procedure: MINIMALLY INVASIVE MITRAL VALVE REPAIR (MVR);  Surgeon: Purcell Nails, MD;  Location[facility] OR;  Service: Open Heart Surgery;  Laterality: Right;  . OVARIAN CYST SURGERY  1988  . TEE WITHOUT CARDIOVERSION  05/19/2012   Procedure: TRANSESOPHAGEAL ECHOCARDIOGRAM (TEE);  Surgeon: Dolores Patty, MD;  Location: [facility] ENDOSCOPY;  Service: Cardiovascular;  Laterality: N/A;  . TUBAL LIGATION  1974  . URETHRAL STRICTURE DILATATION    . UTERINE FIBROID SURGERY  1988  . `       Current Outpatient Medications  Medication Sig Dispense Refill  . acetaminophen (TYLENOL) 500 MG tablet Take 500 mg by mouth every 6 (six) hours as needed (pain).     [name] ALPRAZolam (XANAX) 0.25 MG tablet Take 0.5-1 tablets (0.125-0.25 mg total) by mouth 2 (two) times daily as needed for anxiety. For anxiety 15 tablet 0  . aspirin EC 81 MG tablet Take 81 mg by mouth daily at 2 PM.     . b complex vitamins tablet Take 1 tablet by mouth daily before lunch.    . carvedilol (COREG) 3.125 MG tablet Take 1 tablet (3.125 mg total) by mouth 2 (two) times daily with a meal. 60 tablet 3  . Cholecalciferol (VITAMIN D3) 2000 UNITS TABS Take 2,000 Units by mouth daily before lunch.     . Cyanocobalamin (VITAMIN B-12 PO) Take 1,000 mcg by mouth daily before lunch.     . fluticasone (FLONASE) 50 MCG/ACT nasal spray Place 2 sprays into the nose daily as needed for allergies.     . methimazole (TAPAZOLE) 5 MG tablet Take 5 mg by mouth daily.     [name] NUTRITIONAL SUPPLEMENT LIQD Take 90 mLs by mouth 2 (two) times daily between meals. MedPass    . Omega-3 Fatty Acids (FISH OIL PO) Take  1 capsule by mouth daily before lunch.    [name] OVER THE COUNTER MEDICATION Place 1 drop into both eyes daily as needed (dry eyes/ irritation). Artificial Tears    . pantoprazole (PROTONIX) 40 MG tablet Take 40 mg by mouth daily.     . Probiotic Product (PROBIOTIC DAILY) CAPS Take 250 mg by mouth daily before lunch.     . traMADol (ULTRAM) 50 MG tablet Take 0.5 tablets (25 mg total) by mouth every 6 (six) hours as needed (pain). 30 tablet 0   No current facility-administered medications for this visit.     Allergies:   Amiodarone; Diltiazem; Macrobid [nitrofurantoin monohyd macro]; Aspirin; Naproxen sodium; and Sulfa drugs cross reactors   Social History:  The patient  reports that she has never smoked. She has never used smokeless tobacco. She reports that she does not drink alcohol or use drugs.  Family History:  The patient's family history includes Colon cancer in her father.    ROS:  Please see the history of present illness.   Otherwise, review of systems is positive for weakness, fatigue.   All other systems are reviewed and negative.    PHYSICAL EXAM: VS:  BP 120/80   Pulse (!) 111   Ht 5\' 6"  (1.676 m)   Wt 107 lb (48.5 kg)   SpO2 100%   BMI 17.27 kg/m  , BMI Body mass index is 17.27 kg/m. GEN: Well nourished, well developed, in no acute distress  HEENT: normal  Neck: no JVD, carotid bruits, or masses Cardiac: iRRR; no murmurs, rubs, or gallops,no edema  Respiratory:  clear to auscultation bilaterally, normal work of breathing GI: soft, nontender, nondistended, + BS MS: no deformity or atrophy  Skin: warm and dry Neuro:  Strength and sensation are intact Psych: euthymic mood, full affect  EKG:  EKG is ordered today. Personal review of the ekg ordered shows atrial flutter, atypical, PVCs  Device interrogation is reviewed today in detail.  See PaceArt for details.   Recent Labs: No results found for requested labs within last 8760 hours.    Lipid Panel       Component Value Date/Time   CHOL 226 (H) 07/16/2015 0557   TRIG 74 07/16/2015 0557   HDL 45 07/16/2015 0557   CHOLHDL 5.0 07/16/2015 0557   VLDL 15 07/16/2015 0557   LDLCALC 166 (H) 07/16/2015 0557     Wt Readings from Last 3 Encounters:  08/16/17 107 lb (48.5 kg)  07/13/16 108 lb (49 kg)  05/27/16 115 lb 15.4 oz (52.6 kg)      Other studies Reviewed: Additional studies/ records that were reviewed today include: TTE 06/01/2017 Review of the above records today demonstrates:  LV cavity size normal, ejection fraction 35 to 40%.  No wall motion abnormalities. Mildly dilated right atrium Mild aortic regurgitation Mitral annuloplasty ring in place.  Restricted movement of posterior leaflet.  Mean gradient 4 mmHg. Mild tricuspid regurgitation.  Estimated PA pressure 22 mmHg.   ASSESSMENT AND PLAN:  1.  PVCs: Cardiac monitor shows 22% PVCs.  This is possibly the cause of her reduced ejection fraction.  I discussed the possibility of medical management versus ablation.  She is in atrial flutter today.  We Amarisa Wilinski start her on Xarelto and set her up for cardioversion.  We Suzie Vandam continue further conversation of PVC therapy post cardioversion.  2.  Chronic systolic heart failure due to nonischemic cardiomyopathy: Possibly due to elevated PVC burden.  3.  Paroxysmal atrial fibrillation: Status post maze and left atrial appendage clipping.  Has had no further recurrences to her knowledge.  24-hour monitor without evidence of atrial fibrillation.  She is in atrial flutter, Adis Sturgill start her on Xarelto.  She has had a left atrial appendage clipping, but she would prefer to be cardioverted.  We Avarie Tavano start her on Xarelto today and plan for cardioversion in 3 weeks.  We Dodie Parisi bring her back for further discussion of possible rhythm control.  This patients CHA2DS2-VASc Score and unadjusted Ischemic Stroke Rate (% per year) is equal to 3.2 % stroke rate/year from a score of 3  Above score calculated as 1  point each if present [CHF, HTN, DM, Vascular=MI/PAD/Aortic Plaque, Age if 65-74, or Female] Above score calculated as 2 points each if present [Age > 75, or Stroke/TIA/TE]   Current medicines are reviewed at length with the patient today.  The patient does not have concerns regarding her medicines.  The following changes were made today: Xarelto  Labs/ tests ordered today include:  Orders Placed This Encounter  Procedures  . Basic Metabolic Panel (BMET)  . EKG 12-Lead   Case discssed with primary cardiology  Disposition:   FU with Nadie Fiumara 5 weeks  Signed, Jamielee Mchale Jorja Loa, MD  08/16/2017 12:31 PM     [facility] [address] [place] [address] [phone] (office) [phone] (fax)

## 2017-08-17 LAB — BASIC METABOLIC PANEL
BUN / CREAT RATIO: 15 (ref 12–28)
BUN: 13 mg/dL (ref 8–27)
CALCIUM: 10.2 mg/dL (ref 8.7–10.3)
CHLORIDE: 102 mmol/L (ref 96–106)
CO2: 25 mmol/L (ref 20–29)
Creatinine, Ser: 0.85 mg/dL (ref 0.57–1.00)
GFR calc non Af Amer: 67 mL/min/{1.73_m2} (ref >59)
GFR, EST AFRICAN AMERICAN: 78 mL/min/{1.73_m2} (ref >59)
Glucose: 87 mg/dL (ref 65–99)
POTASSIUM: 4.8 mmol/L (ref 3.5–5.2)
Sodium: 139 mmol/L (ref 134–144)

## 2017-08-18 ENCOUNTER — Telehealth: Payer: Self-pay | Admitting: *Deleted

## 2017-08-18 MED ORDER — RIVAROXABAN 15 MG PO TABS: 15.0000 mg | ORAL_TABLET | Freq: Every day | ORAL | 0 refills | Status: DC

## 2017-08-18 MED ORDER — RIVAROXABAN 15 MG PO TABS: 15.0000 mg | ORAL_TABLET | Freq: Every day | ORAL | 6 refills | Status: DC

## 2017-08-18 NOTE — Telephone Encounter (Addendum)
Advised patient to start Xarelto 15 mg once daily and stop ASA. Pt given 30 day free card at [facility] earlier this week.  She understands to use that card to fill first Rx.  She will get cost of Xarelto from pharmacy when she picks up her 30 day free supply  (if too expensive - she will inform me when I follow up w/ her). Pt understands I will call her in a week/so to arrange DCCV and follow up w/ Dr. Elberta Fortis.  Patient verbalized understanding and agreeable to plan.

## 2017-08-30 NOTE — Telephone Encounter (Signed)
lmtcb

## 2017-08-31 NOTE — Telephone Encounter (Signed)
Informed pt that 5/27 is a holiday and we cannot schedule procedure. She is going to discuss days/options with son & dtr over the weekend. She asks me to call her next week to determine date.  She appreciates my call

## 2017-08-31 NOTE — Telephone Encounter (Signed)
Pt started Xarelto on 4/26. Pt would like to schedule DCCV for 5/27 if possible.  She is aware I will schedule and call her this week/next to review instructions.

## 2017-09-05 NOTE — Telephone Encounter (Signed)
Follow Up:; ° ° °Returning your call. °

## 2017-09-05 NOTE — Telephone Encounter (Signed)
Pt's dtr spoke to her boss who told her to schedule it whenever and he would let her off to take mom. Pt aware I will start w/ 5/28 for scheduling. Will call her back this week to review instructions. Patient verbalized understanding and agreeable to plan.

## 2017-09-06 ENCOUNTER — Telehealth: Payer: Self-pay

## 2017-09-06 NOTE — Telephone Encounter (Signed)
-----   Message from Baird Lyons, RN sent at 09/05/2017  4:20 PM EDT ----- Regarding: Xarelto assistance Can you check to see if pt can get Xarelto for cheaper. thx :)

## 2017-09-06 NOTE — Telephone Encounter (Signed)
**Note De-Identified  Obfuscation** I called the pts pharmacy, [facility], and s/w a pharmacist who advised me that the pts cost for Xarelto is $45 for a 30 day supply and $90 for a 90 day supply.  I called the pt and advised her that Xarelto is at a tier 3 and that is the lowest tier you can get for a brand name medication under Medicare Part D guidelines.  She states that she is not interested in applying for pt assistance as she feels she will not qualify as in the past.  Eliquis or Pradaxa will cost the pt more as they are not a tier 3.  I talked with her concerning changing to Coumadin but she is not interested as she cannot come to the office for routine INR checks.  She states that she will stay on Xarelto but will call us back if she cannot afford.  She is aware that I am mailing her some free RX savings coupons to present to her pharmacy when picking up her medications in hopes that will reduce the cost of all of her medications.  She thanked me for my help.

## 2017-09-08 NOTE — Telephone Encounter (Signed)
Pt reports not feeling well when calling her to discuss DCCV instructions.   She thinks she is having a terrible time w/ indigestion/constipation and took some MOM.  She reports that this helps sometimes when she is constipated. She reports experiencing what she thought was CP but feels it was related to anxiety/constipation.  She called EMS and they told her to take an anxiety pill, which helped. Pt will continue to monitor and call office back/EMS is she experiences again and not sure if heart attack or something else. We did go over signs of a heart attack with her.   Informed patient that I would follow back up tomorrow/next week to go over DCCV instructions.  Informed her that I would rather her be feeling better when we discuss instructions.  Pt agreeable and appreciative of my caring. Patient verbalized understanding and agreeable to plan.

## 2017-09-14 ENCOUNTER — Encounter: Payer: Self-pay | Admitting: Cardiology

## 2017-09-14 NOTE — Telephone Encounter (Signed)
This encounter was created in error - please disregard.

## 2017-09-14 NOTE — Telephone Encounter (Signed)
Lily Kocher D at 09/14/2017 9:20 AM   Status: Signed    New Message   Pt states she has questions regarding an upcoming procedure. Please call

## 2017-09-14 NOTE — Telephone Encounter (Signed)
New Message   Pt states she has questions regarding an upcoming procedure. Please call

## 2017-09-14 NOTE — Telephone Encounter (Signed)
lmtcb

## 2017-09-19 DIAGNOSIS — I4891 Unspecified atrial fibrillation: Secondary | ICD-10-CM | POA: Diagnosis not present

## 2017-09-20 ENCOUNTER — Other Ambulatory Visit: Payer: Self-pay

## 2017-09-20 ENCOUNTER — Emergency Department (HOSPITAL_COMMUNITY)
Admission: EM | Admit: 2017-09-20 | Discharge: 2017-09-20 | Disposition: A | Discharge status: Home / Self Care | Payer: PPO | Attending: Emergency Medicine | Admitting: Emergency Medicine

## 2017-09-20 ENCOUNTER — Emergency Department (HOSPITAL_COMMUNITY): Payer: PPO

## 2017-09-20 ENCOUNTER — Encounter (HOSPITAL_COMMUNITY): Payer: Self-pay | Admitting: Emergency Medicine

## 2017-09-20 DIAGNOSIS — Z743 Need for continuous supervision: Secondary | ICD-10-CM | POA: Diagnosis not present

## 2017-09-20 DIAGNOSIS — I4891 Unspecified atrial fibrillation: Secondary | ICD-10-CM | POA: Insufficient documentation

## 2017-09-20 DIAGNOSIS — Z0189 Encounter for other specified special examinations: Secondary | ICD-10-CM | POA: Diagnosis not present

## 2017-09-20 DIAGNOSIS — R279 Unspecified lack of coordination: Secondary | ICD-10-CM | POA: Diagnosis not present

## 2017-09-20 DIAGNOSIS — R002 Palpitations: Secondary | ICD-10-CM | POA: Diagnosis not present

## 2017-09-20 DIAGNOSIS — R000 Tachycardia, unspecified: Secondary | ICD-10-CM | POA: Diagnosis not present

## 2017-09-20 LAB — I-STAT TROPONIN, ED: Troponin i, poc: 0 ng/mL (ref 0.00–0.08)

## 2017-09-20 LAB — BASIC METABOLIC PANEL
ANION GAP: 8 (ref 5–15)
BUN: 12 mg/dL (ref 6–20)
CALCIUM: 10.2 mg/dL (ref 8.9–10.3)
CHLORIDE: 106 mmol/L (ref 101–111)
CO2: 26 mmol/L (ref 22–32)
Creatinine, Ser: 0.84 mg/dL (ref 0.44–1.00)
GFR calc Af Amer: >60 mL/min (ref >60)
GFR calc non Af Amer: >60 mL/min (ref >60)
GLUCOSE: 101 mg/dL — AB (ref 65–99)
Potassium: 4.6 mmol/L (ref 3.5–5.1)
Sodium: 140 mmol/L (ref 135–145)

## 2017-09-20 LAB — CBC WITH DIFFERENTIAL/PLATELET
Abs Immature Granulocytes: 0 10*3/uL (ref 0.0–0.1)
Basophils Absolute: 0 10*3/uL (ref 0.0–0.1)
Basophils Relative: 0 %
EOS ABS: 0 10*3/uL (ref 0.0–0.7)
EOS PCT: 0 %
HEMATOCRIT: 41.1 % (ref 36.0–46.0)
Hemoglobin: 13.4 g/dL (ref 12.0–15.0)
IMMATURE GRANULOCYTES: 0 %
LYMPHS ABS: 1.3 10*3/uL (ref 0.7–4.0)
Lymphocytes Relative: 16 %
MCH: 28.9 pg (ref 26.0–34.0)
MCHC: 32.6 g/dL (ref 30.0–36.0)
MCV: 88.6 fL (ref 78.0–100.0)
MONOS PCT: 5 %
Monocytes Absolute: 0.4 10*3/uL (ref 0.1–1.0)
NEUTROS PCT: 79 %
Neutro Abs: 6.1 10*3/uL (ref 1.7–7.7)
Platelets: 226 10*3/uL (ref 150–400)
RBC: 4.64 MIL/uL (ref 3.87–5.11)
RDW: 14.4 % (ref 11.5–15.5)
WBC: 7.8 10*3/uL (ref 4.0–10.5)

## 2017-09-20 LAB — TSH: TSH: 2.254 u[IU]/mL (ref 0.350–4.500)

## 2017-09-20 LAB — T4, FREE: FREE T4: 0.83 ng/dL (ref 0.82–1.77)

## 2017-09-20 MED ORDER — ETOMIDATE 2 MG/ML IV SOLN
INTRAVENOUS | Status: AC | PRN
  Administered 2017-09-20: 5 mg via INTRAVENOUS

## 2017-09-20 MED ORDER — PROPOFOL 10 MG/ML IV BOLUS: 0.5000 mg/kg | Freq: Once | INTRAVENOUS | Status: DC

## 2017-09-20 MED ORDER — ONDANSETRON HCL 4 MG/2ML IJ SOLN
INTRAMUSCULAR | Status: AC
  Administered 2017-09-20: 4 mg
  Filled 2017-09-20: qty 2

## 2017-09-20 MED ORDER — METOPROLOL TARTRATE 5 MG/5ML IV SOLN
5.0000 mg | Freq: Once | INTRAVENOUS | Status: AC
  Administered 2017-09-20: 5 mg via INTRAVENOUS
  Filled 2017-09-20: qty 5

## 2017-09-20 MED ORDER — ETOMIDATE 2 MG/ML IV SOLN
0.1000 mg/kg | Freq: Once | INTRAVENOUS | Status: AC
  Administered 2017-09-20: 5 mg via INTRAVENOUS

## 2017-09-20 MED ORDER — RIVAROXABAN 15 MG PO TABS
15.0000 mg | ORAL_TABLET | Freq: Once | ORAL | Status: AC
  Administered 2017-09-20: 15 mg via ORAL
  Filled 2017-09-20: qty 1

## 2017-09-20 MED ORDER — SODIUM CHLORIDE 0.9 % IV BOLUS
500.0000 mL | Freq: Once | INTRAVENOUS | Status: AC
  Administered 2017-09-20: 500 mL via INTRAVENOUS

## 2017-09-20 MED ORDER — LORAZEPAM 2 MG/ML IJ SOLN
1.0000 mg | Freq: Once | INTRAMUSCULAR | Status: AC
  Administered 2017-09-20: 1 mg via INTRAVENOUS
  Filled 2017-09-20: qty 1

## 2017-09-20 NOTE — ED Provider Notes (Signed)
.Sedation Date/Time: 09/20/2017 11:33 AM Performed by: Little, Rachel Morgan, MD Authorized by: Little, Rachel Morgan, MD   Consent:    Consent obtained:  Written   Consent given by:  Patient   Risks discussed:  Allergic reaction, inadequate sedation, prolonged hypoxia resulting in organ damage, respiratory compromise necessitating ventilatory assistance and intubation, nausea and vomiting Universal protocol:    Procedure explained and questions answered to patient or proxy's satisfaction: yes     Required blood products, implants, devices, and special equipment available: yes     Immediately prior to procedure a time out was called: yes   Indications:    Procedure performed:  Cardioversion   Procedure necessitating sedation performed by:  Different physician   Intended level of sedation:  Moderate (conscious sedation) Pre-sedation assessment:    Time since last food or drink:  6   NPO status caution: urgency dictates proceeding with non-ideal NPO status     ASA classification: class 2 - patient with mild systemic disease     Neck mobility: normal     Mouth opening:  3 or more finger widths   Mallampati score:  III - soft palate, base of uvula visible   Pre-sedation assessments completed and reviewed: airway patency, cardiovascular function, mental status, nausea/vomiting and respiratory function   Immediate pre-procedure details:    Reassessment: Patient reassessed immediately prior to procedure     Reviewed: vital signs and NPO status     Verified: bag valve mask available, emergency equipment available, intubation equipment available, IV patency confirmed, oxygen available and suction available   Procedure details (see MAR for exact dosages):    Preoxygenation:  Nasal cannula   Sedation:  Etomidate   Intra-procedure monitoring:  Blood pressure monitoring, cardiac monitor, continuous pulse oximetry, continuous capnometry, frequent LOC assessments and frequent vital sign checks  Intra-procedure events comment:  Gagging/wretching    Intra-procedure management:  Airway repositioning and airway suctioning   Total Provider sedation time (minutes):  5 Post-procedure details:    Attendance: Constant attendance by certified staff until patient recovered     Post-sedation assessments completed and reviewed: airway patency, cardiovascular function, mental status, nausea/vomiting and respiratory function     Patient is stable for discharge or admission: yes     Patient tolerance:  Tolerated well, no immediate complications Comments:     Patient began having gagging after 2nd attempt at cardioversion; sat her up and suctioned mouth, no actual vomiting during procedure. Given zofran.    Patient w/ [id number] Mostb on anticoagulation p/w worsening palpitations feeling at home today. Follows w/ Dr. Ganji, planned for cardioversion but due to holiday weekend, rescheduled for the end of this week. On exam, she was anxious but well appearing, irregularly irregular rhythm, tachycardic. EKG shows A fib w/ RVR and frequent PVCs. Cardiology contacted, performed cardioversion under sedation, see procedure notes. She initially remained in A fib after cardioversion x 2, then briefly converted to sinus rhythm, then back to A fib. Her rate has been controlled in ED. Cardiology recommended continuing home medications and follow-up in their clinic.  CRITICAL CARE Performed by: Rachel Morgan Little   Total critical care time: 30 minutes  Critical care time was exclusive of separately billable procedures and treating other patients.  Critical care was necessary to treat or prevent imminent or life-threatening deterioration.  Critical care was time spent personally by me on the following activities: development of treatment plan with patient and/or surrogate as well as nursing, discussions with consultants, evaluation  of patient's response to treatment, examination of patient, obtaining history from  patient or surrogate, ordering and performing treatments and interventions, ordering and review of laboratory studies, ordering and review of radiographic studies, pulse oximetry and re-evaluation of patient's condition.    Little, Ambrose Finland, MD 09/20/17 1410

## 2017-09-20 NOTE — ED Notes (Signed)
Dr Rosemary Holms here to do cardioversion, dt little at bedside , permit signed and pt states understanding,

## 2017-09-20 NOTE — Discharge Instructions (Addendum)
Please call and follow up closely with your cardiologist Dr. Jacinto Halim on 10/06/2017 at 11 AM for further management of your atrial fibrillation.  Return if you have any concerns.

## 2017-09-20 NOTE — ED Notes (Signed)
BOWIE INTO SPEAK TO FAMILY ABOUT HAVING SOMEONE STAY WITH HER AT NIGHT , CONSULT CARE MX

## 2017-09-20 NOTE — ED Notes (Signed)
Care mx into speak to pt , [facility] called to take pt home

## 2017-09-20 NOTE — ED Notes (Signed)
Patient assisted to restroom and back into bed. Patient ambulatory with assistance.

## 2017-09-20 NOTE — ED Notes (Signed)
Setting up for cardioversion

## 2017-09-20 NOTE — ED Notes (Signed)
Pt aware that they are going to do cardioversion today, pt states very nervous , family and friends in room

## 2017-09-20 NOTE — ED Notes (Signed)
Patient assisted into clothing and moved to hall bed while she awaits [facility]. Patient's daughter called and notified of plan.

## 2017-09-20 NOTE — Discharge Planning (Signed)
Saliha Salts J. Laconya Clere, RN, BSN, NCM [phone] Spoke with pt at bedside regarding discharge planning for Home Health Services. Offered pt list of home health agencies to choose from.  Pt chose [facility] to render services. Ellen Williams of [facility] notified. Patient made aware that [facility] will be in contact in 24-48 hours.  No DME needs identified at this time.  

## 2017-09-20 NOTE — Procedures (Addendum)
Direct current cardioversion:  Indication symptomatic A. Fibrillation.  Procedure: Deep sedation administered and provided by ER physician Dr. Clarene Duke. Synchronized direct current cardioversion performed. Patient was delivered with 120, and 150 Joules of electricity X 2 with brief conversion to sinus rhythm, but did not sustain sinus rhythm. She developed significant nausea, limiting further attempts of cardioversion under deep sedation. Few minutes later, she reverted back to sinus rhythm with first degree AV block and frequent PVC's.   Continue xarelto and coreg 3.125 mg bid for now. Will arrange outpatient follow up. If she returns to Afib, may consider antiarrhythmic therapy in future.   Elder Negus, MD [facility] [place] Pager: [phone] Office: [phone] If no answer Cell [phone]

## 2017-09-20 NOTE — ED Provider Notes (Signed)
[facility] EMERGENCY DEPARTMENT Provider Note   CSN: [id number] Arrival date & time: 09/20/17  0901     History   Chief Complaint No chief complaint on file.   HPI [name] [name] is a 77 y.o. female.  HPI   77 year old female with history of atrial fibrillation currently on Xarelto, ACS, CHF, hypertension, valvular disease brought here via EMS from home presenting for evaluation of heart palpitation.  Patient mention she was diagnosed with atrial fibrillation approximately 2 months ago.  Cardiologist, Dr. Jacinto Halim did schedule a cardioversion on minimal weekend however due to it being a holiday, it was rescheduled for 5 days from now.  Patient mention for the past week she have not been feeling well, yesterday she felt hot palpitation and weak, but this morning her symptoms become much more progressive.  States that she felt her heart is racing as if is going to jump out of her chest.  She endorsed feeling lightheadedness, weakness, nausea, having some slight diarrhea.  No associated chest pain but did report some indigestion previously.  She also endorse progressive weight loss of unknown amount for the past several months.  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  She is currently on methimazole.    Past Medical History:  Diagnosis Date  . Acute coronary syndrome (HCC) 06/2015  . 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 Anemia   . Anxiety   . Aortic stenosis 05/19/2012   trivial by TEE  . Atrial fibrillation (HCC)    a. Dx 04/2012;  b. Amio d/c'd 2/2 hyperthyroidism;  c. Atrial flutter 10/2012;  d. On xarelto.  [name]   . CHF (congestive heart failure) (HCC)   . Complication of anesthesia   . Coronary artery disease   . Diverticulitis   . GERD (gastroesophageal reflux disease)   . Goiter   . Headache   . Heart murmur   . Hyperlipidemia    recently- taken off Crestor- due to pain in her legs, but this was after cardiac cath. , pt.  questioning whether the pain in her legs was related to cath. or crestor  . Hypertension    Then hypotension 04/2012 limiting med adjustment.  . Intractable vomiting   . Malnutrition of moderate degree (HCC)   . Microscopic hematuria 08/20/2012  . Mitral valve prolapse    a. H/o MVP, with severe MR 04/2012, had MV repair and Maze  . Pneumonia    treated /w antibiotic- [facility]- early Feb. 2014  . PONV (postoperative nausea and vomiting)   . Refusal of blood transfusions as patient is Jehovah's Witness   . S/P Maze operation for atrial fibrillation 06/28/2012   Complete bilateral atrial lesion set using cryothermy ablation with oversewing of LA appendage via right minithoracotomy  . S/P mitral valve repair 06/28/2012   Complex valvuloplasty including quadrangular resection of posterior leaflet, sliding leaflet plasty, artificial Gore-tex neocord placement x2 and 41mm Sorin Memo 3D ring annuloplasty via right mini thoracotomy   . SBO (small bowel obstruction) (HCC)   . Shortness of breath     Patient Active Problem List   Diagnosis Date Noted  . Malnutrition of moderate degree 05/17/2016  . Bigeminy   . Intractable vomiting 05/15/2016  . Heart palpitations 05/15/2016  . SBO (small bowel obstruction) (HCC) 05/15/2016  . Acute coronary syndrome (HCC) 07/15/2015  . Status post Maze operation for atrial fibrillation 01/08/2013  . S/P MVR (mitral valve repair) 01/08/2013  . Atrial flutter (HCC) 10/30/2012  .  Amaurosis fugax 10/30/2012  . Dyspnea 10/28/2012  . 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 Thyroid goiter 08/21/2012  . Microscopic hematuria 08/20/2012  . S/P mitral valve repair 06/28/2012  . History of mitral valve prolapse with severe mitral regurgtation 05/19/2012  . Refusal of blood transfusions as patient is Jehovah's Witness 05/19/2012  . Atrial fibrillation (HCC) 05/16/2012  . Anxiety 04/27/2011  . Hypertension 04/26/2011  . Diverticulitis 04/24/2011  . Hyperlipidemia       Past Surgical History:  Procedure Laterality Date  . CARDIAC CATHETERIZATION N/A 07/16/2015   Procedure: Left Heart Cath and Coronary Angiography;  Surgeon: Orpah Cobb, MD;  Location: [facility] INVASIVE CV LAB;  Service: Cardiovascular;  Laterality: N/A;  . CORONARY ARTERY BYPASS GRAFT    . INTRAOPERATIVE TRANSESOPHAGEAL ECHOCARDIOGRAM N/A 06/28/2012   Procedure: INTRAOPERATIVE TRANSESOPHAGEAL ECHOCARDIOGRAM;  Surgeon: Purcell Nails, MD;  Location: [facility] OR;  Service: Open Heart Surgery;  Laterality: N/A;  . LAPAROSCOPY N/A 05/21/2016   Procedure: LAPAROSCOPY DIAGNOSTIC, LYSIS OF ADHESION BAND;  Surgeon: Axel Filler, MD;  Location: [facility] OR;  Service: General;  Laterality: N/A;  . LEFT AND RIGHT HEART CATHETERIZATION WITH CORONARY ANGIOGRAM N/A 05/22/2012   Procedure: LEFT AND RIGHT HEART CATHETERIZATION WITH CORONARY ANGIOGRAM;  Surgeon: Kathleene Hazel, MD;  Location: [facility] CATH LAB;  Service: Cardiovascular;  Laterality: N/A;  . MINIMALLY INVASIVE MAZE PROCEDURE N/A 06/28/2012   Procedure: MINIMALLY INVASIVE MAZE PROCEDURE;  Surgeon: Purcell Nails, MD;  Location: [facility] OR;  Service: Open Heart Surgery;  Laterality: N/A;  . MITRAL VALVE REPAIR Right 06/28/2012   Procedure: MINIMALLY INVASIVE MITRAL VALVE REPAIR (MVR);  Surgeon: Purcell Nails, MD;  Location: [facility] OR;  Service: Open Heart Surgery;  Laterality: Right;  . OVARIAN CYST SURGERY  1988  . TEE WITHOUT CARDIOVERSION  05/19/2012   Procedure: TRANSESOPHAGEAL ECHOCARDIOGRAM (TEE);  Surgeon: Dolores Patty, MD;  Location: [facility] ENDOSCOPY;  Service: Cardiovascular;  Laterality: N/A;  . TUBAL LIGATION  1974  . URETHRAL STRICTURE DILATATION    . UTERINE FIBROID SURGERY  1988  . `       OB History   None      Home Medications    Prior to Admission medications   Medication Sig Start Date End Date Taking? Authorizing Provider  ALPRAZolam (XANAX) 0.25 MG tablet Take 0.5-1 tablets (0.125-0.25 mg total) by mouth 2 (two) times daily as needed  for anxiety. For anxiety Patient taking differently: Take 0.125-0.25 mg by mouth at bedtime as needed for anxiety.  05/26/16   Mikhail, Nita Sells, DO  carvedilol (COREG) 3.125 MG tablet Take 1 tablet (3.125 mg total) by mouth 2 (two) times daily with a meal. 07/18/15   Orpah Cobb, MD  cholecalciferol (VITAMIN D) 1000 units tablet Take 1,000 Units by mouth daily.     [provider]  fluticasone (FLONASE) 50 MCG/ACT nasal spray Place 1 spray into both nostrils daily as needed for allergies.  12/08/12   [provider]  Menthol, Topical Analgesic, (ICY HOT EX) Apply 1 application topically daily as needed (back pain).    [provider]  methimazole (TAPAZOLE) 5 MG tablet Take 5 mg by mouth daily.     [provider]  Omega-3 Fatty Acids (FISH OIL PO) Take 1 capsule by mouth once a week.     [provider]  pantoprazole (PROTONIX) 40 MG tablet Take 40 mg by mouth daily.  08/24/12   Geoffry Paradise, MD  Rivaroxaban (XARELTO) 15 MG TABS tablet Take 1 tablet (  15 mg total) by mouth daily with supper. 08/18/17   Camnitz, Andree Coss, MD  Rivaroxaban (XARELTO) 15 MG TABS tablet Take 1 tablet (15 mg total) by mouth daily with supper. 08/18/17   Camnitz, Andree Coss, MD  traMADol (ULTRAM) 50 MG tablet Take 0.5 tablets (25 mg total) by mouth every 6 (six) hours as needed (pain). Patient taking differently: Take 25 mg by mouth every 4 (four) hours as needed for moderate pain.  05/26/16   Edsel Petrin, DO    Family History Family History  Problem Relation Age of Onset  . Colon cancer Father     Social History Social History   Tobacco Use  . Smoking status: Never Smoker  . Smokeless tobacco: Never Used  Substance Use Topics  . Alcohol use: No    Comment: 1 drink a month  . Drug use: No     Allergies   Amiodarone; Diltiazem; Macrobid [nitrofurantoin monohyd macro]; Aspirin; Naproxen sodium; and Sulfa drugs cross reactors   Review of Systems Review  of Systems  All other systems reviewed and are negative.    Physical Exam Updated Vital Signs BP (!) 136/98   Pulse (!) 120   Temp 98 F (36.7 C) (Oral)   Resp 16   SpO2 96%   Physical Exam  Constitutional: She is oriented to person, place, and time.  Frail appearing elderly female appears uncomfortable, dry heaving.  HENT:  Mouth/Throat: Oropharynx is clear and moist.  Eyes: Pupils are equal, round, and reactive to light. EOM are normal.  Neck: Normal range of motion. Neck supple.  Cardiovascular:  Irregularly irregular heart rhythm without murmur rubs or gallops  Pulmonary/Chest: Effort normal and breath sounds normal.  Abdominal: Soft. She exhibits no distension. There is no tenderness.  Musculoskeletal: She exhibits no edema.  Neurological: She is alert and oriented to person, place, and time.     ED Treatments / Results  Labs (all labs ordered are listed, but only abnormal results are displayed) Labs Reviewed  BASIC METABOLIC PANEL - Abnormal; Notable for the following components:      Result Value   Glucose, Bld 101 (*)    All other components within normal limits  CBC WITH DIFFERENTIAL/PLATELET  TSH  T4, FREE  I-STAT TROPONIN, ED    EKG EKG Interpretation  Date/Time:  Tuesday Sep 20 2017 09:09:58 EDT Ventricular Rate:  115 PR Interval:    QRS Duration: 115 QT Interval:  358 QTC Calculation: 496 R Axis:   -72 Text Interpretation:  Atrial fibrillation Paired ventricular premature complexes Left anterior fascicular block Anteroseptal infarct, age indeterminate Lateral leads are also involved A fib with frequent PVCs Confirmed by Frederick Peers [phone]) on 09/20/2017 12:57:16 PM   Radiology Dg Chest Portable 1 View  Result Date: 09/20/2017 CLINICAL DATA:  Palpitations EXAM: PORTABLE CHEST 1 VIEW COMPARISON:  11/07/2015 FINDINGS: Chronic cardiomegaly. Mitral valve repair. Stable aortic contours. Right thyroid mass with tracheal deviation, chronic. No  detected progressive airway narrowing. The lungs are clear. No acute osseous finding. Artifact from EKG leads. IMPRESSION: 1. No acute finding when compared to prior. 2. Chronic cardiomegaly. 3. Chronic right thyroid mass with tracheal deviation. Electronically Signed   By: Marnee Spring M.D.   On: 09/20/2017 09:43    Procedures Procedures (including critical care time)  Medications Ordered in ED Medications  sodium chloride 0.9 % bolus 500 mL (0 mLs Intravenous Stopped 09/20/17 1209)  metoprolol tartrate (LOPRESSOR) injection 5 mg (5 mg Intravenous Given 09/20/17 0953)  Rivaroxaban (XARELTO) tablet 15 mg (15 mg Oral Given 09/20/17 1335)  etomidate (AMIDATE) injection 0.1 mg/kg (5 mg Intravenous Given 09/20/17 1132)  ondansetron (ZOFRAN) 4 MG/2ML injection (4 mg  Given 09/20/17 1117)  etomidate (AMIDATE) injection (5 mg Intravenous Given 09/20/17 1112)  LORazepam (ATIVAN) injection 1 mg (1 mg Intravenous Given 09/20/17 1212)     Initial Impression / Assessment and Plan / ED Course  I have reviewed the triage vital signs and the nursing notes.  Pertinent labs & imaging results that were available during my care of the patient were reviewed by me and considered in my medical decision making (see chart for details).     BP 122/71   Pulse (!) 106   Temp 98 F (36.7 C) (Oral)   Resp 13   SpO2 100%    Final Clinical Impressions(s) / ED Diagnoses   Final diagnoses:  Atrial fibrillation with RVR (HCC)  Encounter for cardioversion procedure    ED Discharge Orders    None     11:30 AM Patient with new onset A. fib diagnosed 2 months ago currently on Xarelto presenting with worsening heart palpitation.  She was found to be in A. fib with RVR.  She is allergic to diltiazem and therefore metoprolol was given.  Appreciate consultation from cardiologist, Dr. Jacinto Halim who would like to cardiovert patient in the ER.  His partner, Dr. Jeffie Pollock was therefore the cardioversion.  Patient was  shocked twice and etomidate, and subsequently converted to a sinus rhythm temporarily.  Her rhythm went back to A. fib and now converted back to sinus and heart rate stabilized.  She is currently having nausea, and fatigue from the recent cardioversion.  Plan to provide supportive care and anticipate discharging home with outpatient follow-up with cardiology for further management.  Suspect her sinus rhythm may not be sustained.  However, additional cardioversion may not provide adequate relief. Cardioversion was done under the care of Dr. Clarene Duke.   1:49 PM Case manager will help to provide outpatient nursing support as needed.  Patient felt unsteady on her feet, with generalized weakness and concern for return of A. fib.  Family does not feel patient can care for herself at home because she is by herself.  Family prefers to have patient admitted for management of her heart rate and her deconditioning.  However, patient does not meet admission criteria.  Patient will need to follow-up with cardiology for further management of her condition.  She has been monitoring for the past several hours and maintain in sinus rhythm.  Her symptoms did improve with symptomatic treatment.  Normal troponin, normal TSH, normal WBC, hemoglobin, electrolytes panels are reassuring, chest x-ray unremarkable.  Case manager recommend ordering face-to-face RN, and PT for outpatient support.  Will discharge patient and she will follow-up with her cardiologist.  CRITICAL CARE Performed by: Fayrene Helper Total critical care time: 45 minutes Critical care time was exclusive of separately billable procedures and treating other patients. Critical care was necessary to treat or prevent imminent or life-threatening deterioration. Critical care was time spent personally by me on the following activities: development of treatment plan with patient and/or surrogate as well as nursing, discussions with consultants, evaluation of patient's  response to treatment, examination of patient, obtaining history from patient or surrogate, ordering and performing treatments and interventions, ordering and review of laboratory studies, ordering and review of radiographic studies, pulse oximetry and re-evaluation of patient's condition.    Fayrene Helper, PA-C 09/20/17 1400  Little, Ambrose Finland, MD 09/20/17 1410

## 2017-09-20 NOTE — Consult Note (Signed)
Reason for Consult: Atrial fibrtillation Referring Physician: [facility]  [name] [name] is an 77 y.o. female.  HPI:   77 year old female with hypertension, hyperlipidemia, mitral valve repair and left atrial appendage clipping with maze in 2014, frequent PVCs with PVC burden 22%, nonischemic cardia myopathy with EF 35 to 40% on echocardiogram in 05/2017, now presented to emergency department with tachycardia and found to be in atrial fibrillation with RVR.  Patient has been on Xarelto since 08/18/2017.  Past Medical History:  Diagnosis Date  . Acute coronary syndrome (HCC) 06/2015  . 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 Anemia   . Anxiety   . Aortic stenosis 05/19/2012   trivial by TEE  . Atrial fibrillation (HCC)    a. Dx 04/2012;  b. Amio d/c'd 2/2 hyperthyroidism;  c. Atrial flutter 10/2012;  d. On xarelto.  [name] [name]   . CHF (congestive heart failure) (HCC)   . Complication of anesthesia   . Coronary artery disease   . Diverticulitis   . GERD (gastroesophageal reflux disease)   . Goiter   . Headache   . Heart murmur   . Hyperlipidemia    recently- taken off Crestor- due to pain in her legs, but this was after cardiac cath. , pt. questioning whether the pain in her legs was related to cath. or crestor  . Hypertension    Then hypotension 04/2012 limiting med adjustment.  . Intractable vomiting   . Malnutrition of moderate degree (HCC)   . Microscopic hematuria 08/20/2012  . Mitral valve prolapse    a. H/o MVP, with severe MR 04/2012, had MV repair and Maze  . Pneumonia    treated /w antibiotic- [facility]- early Feb. 2014  . PONV (postoperative nausea and vomiting)   . Refusal of blood transfusions as patient is Jehovah's Witness   . S/P Maze operation for atrial fibrillation 06/28/2012   Complete bilateral atrial lesion set using cryothermy ablation with oversewing of LA appendage via right minithoracotomy  . S/P mitral valve repair 06/28/2012   Complex valvuloplasty  including quadrangular resection of posterior leaflet, sliding leaflet plasty, artificial Gore-tex neocord placement x2 and 38mm Sorin Memo 3D ring annuloplasty via right mini thoracotomy   . SBO (small bowel obstruction) (HCC)   . Shortness of breath     Past Surgical History:  Procedure Laterality Date  . CARDIAC CATHETERIZATION N/A 07/16/2015   Procedure: Left Heart Cath and Coronary Angiography;  Surgeon: Orpah Cobb, MD;  Location: [facility] INVASIVE CV LAB;  Service: Cardiovascular;  Laterality: N/A;  . CORONARY ARTERY BYPASS GRAFT    . INTRAOPERATIVE TRANSESOPHAGEAL ECHOCARDIOGRAM N/A 06/28/2012   Procedure: INTRAOPERATIVE TRANSESOPHAGEAL ECHOCARDIOGRAM;  Surgeon: Purcell Nails, MD;  Location: [facility] OR;  Service: Open Heart Surgery;  Laterality: N/A;  . LAPAROSCOPY N/A 05/21/2016   Procedure: LAPAROSCOPY DIAGNOSTIC, LYSIS OF ADHESION BAND;  Surgeon: Axel Filler, MD;  Location: [facility] OR;  Service: General;  Laterality: N/A;  . LEFT AND RIGHT HEART CATHETERIZATION WITH CORONARY ANGIOGRAM N/A 05/22/2012   Procedure: LEFT AND RIGHT HEART CATHETERIZATION WITH CORONARY ANGIOGRAM;  Surgeon: Kathleene Hazel, MD;  Location: [facility] CATH LAB;  Service: Cardiovascular;  Laterality: N/A;  . MINIMALLY INVASIVE MAZE PROCEDURE N/A 06/28/2012   Procedure: MINIMALLY INVASIVE MAZE PROCEDURE;  Surgeon: Purcell Nails, MD;  Location: [facility] OR;  Service: Open Heart Surgery;  Laterality: N/A;  . MITRAL VALVE REPAIR Right 06/28/2012   Procedure: MINIMALLY INVASIVE MITRAL VALVE REPAIR (MVR);  Surgeon: Purcell Nails, MD;  Location:  [facility] OR;  Service: Open Heart Surgery;  Laterality: Right;  . OVARIAN CYST SURGERY  1988  . TEE WITHOUT CARDIOVERSION  05/19/2012   Procedure: TRANSESOPHAGEAL ECHOCARDIOGRAM (TEE);  Surgeon: Dolores Patty, MD;  Location: [facility] ENDOSCOPY;  Service: Cardiovascular;  Laterality: N/A;  . TUBAL LIGATION  1974  . URETHRAL STRICTURE DILATATION    . UTERINE FIBROID SURGERY  1988  . `      Family  History  Problem Relation Age of Onset  . Colon cancer Father     Social History:  reports that she has never smoked. She has never used smokeless tobacco. She reports that she does not drink alcohol or use drugs.  Allergies:  Allergies  Allergen Reactions  . Amiodarone Other (See Comments)    Hyperthyroidism   . Diltiazem Other (See Comments)    Put into hospital for 3 days  . Macrobid [Nitrofurantoin Monohyd Macro] Nausea Only  . Aspirin Rash    Tolerates low dose aspirin  . Naproxen Sodium Swelling and Rash  . Sulfa Drugs Cross Reactors Other (See Comments)    Allergy per mother - unknown reaction    Medications: I have reviewed the patient's current medications.  No results found for this or any previous visit (from the past 48 hour(s)).  Dg Chest Portable 1 View  Result Date: 09/20/2017 CLINICAL DATA:  Palpitations EXAM: PORTABLE CHEST 1 VIEW COMPARISON:  11/07/2015 FINDINGS: Chronic cardiomegaly. Mitral valve repair. Stable aortic contours. Right thyroid mass with tracheal deviation, chronic. No detected progressive airway narrowing. The lungs are clear. No acute osseous finding. Artifact from EKG leads. IMPRESSION: 1. No acute finding when compared to prior. 2. Chronic cardiomegaly. 3. Chronic right thyroid mass with tracheal deviation. Electronically Signed   By: Marnee Spring M.D.   On: 09/20/2017 09:43   Echocardiogram 06/01/2017:  Normal LV cavity size.  Moderate decrease in global wall motion.  EF 35 to 40%.   Right atrial cavity mildly dilated.   Mild aortic regurgitation.   Mitral annuloplasty ring in place and mitral valve without any pannus or thrombus. Pressure gradient 4 mmHg, MVA 1.9 cm by PHT method at heart rate 143 bpm.  Mild mitral stenosis. Moderate mitral regurgitation.   Mild tricuspid regurgitation.  No evidence of pulmonary hypertension. Compared to previous echocardiogram in 2017, EF is mildly reduced.   Review of Systems  Constitutional:  Positive for malaise/fatigue.  HENT: Negative.   Eyes: Negative.   Respiratory: Negative for cough.   Cardiovascular: Positive for palpitations. Negative for chest pain and leg swelling.  Gastrointestinal: Negative for abdominal pain, diarrhea and nausea.  Genitourinary: Negative.   Musculoskeletal: Negative.   Skin: Negative.   Neurological: Negative for loss of consciousness.  Endo/Heme/Allergies: Negative for environmental allergies.  Psychiatric/Behavioral: Negative.   All other systems reviewed and are negative.  Blood pressure 124/80, pulse (!) 116, temperature 98 F (36.7 C), temperature source Oral, resp. rate 20, SpO2 99 %. Physical Exam  Nursing note and vitals reviewed. Constitutional: She is oriented to person, place, and time.  Cachetic person.  HENT:  Head: Normocephalic and atraumatic.  Eyes: Conjunctivae are normal.  Neck: Normal range of motion. Neck supple. JVD present.  Cardiovascular:  Tachycardic. Variable S1, normal S2. No murmur  Respiratory: Breath sounds normal. She is in respiratory distress. She has no rales. She exhibits no tenderness.  GI: Soft. Bowel sounds are normal. Mass: Assessment: There is no tenderness. There is no rebound.  Musculoskeletal: She exhibits no edema.  Neurological:  She is alert and oriented to person, place, and time. No cranial nerve deficit.  Skin: Skin is warm and dry.    Assessment:  77 y/o Caucasian female Afib w/RVR CHA2DS2VASc score 5, 7.2% annual stroke risk Frequent PVC's Nonischemic cardiomyopathy H/o mitral valve repair and Maze procedure  Recommendations: Dc cardioversion in the ED Continue Xarelto. Continue baseline medical therapy.    Nature Vogelsang J Broc Caspers 09/20/2017, 10:35 AM   Lam Bjorklund Emiliano Dyer, MD [facility]. [place] Pager: [phone] Office: [phone] If no answer Cell [phone]

## 2017-09-20 NOTE — ED Notes (Signed)
Patient verbalized understanding of discharge instructions and denies any further needs or questions at this time. VS stable. [facility] here to take patient home.

## 2017-09-21 NOTE — Telephone Encounter (Signed)
Regan Lemming, MD  Thomas Hoff, RN        Presented to the ER in flutter, cardioverted. Needs follow up to discuss options.

## 2017-09-21 NOTE — Telephone Encounter (Signed)
Informed patient DCCV scheduled for Friday is cancelled. Scheduled pt to follow up with Dr. Elberta Fortis next Thursday to discuss. Patient verbalized understanding and agreeable to plan.

## 2017-09-23 ENCOUNTER — Ambulatory Visit (HOSPITAL_COMMUNITY): Admission: RE | Admit: 2017-09-23 | Payer: PPO | Source: Ambulatory Visit | Admitting: Cardiology

## 2017-09-23 ENCOUNTER — Encounter (HOSPITAL_COMMUNITY): Admission: RE | Payer: Self-pay | Source: Ambulatory Visit

## 2017-09-23 SURGERY — CARDIOVERSION
Anesthesia: General

## 2017-09-26 DIAGNOSIS — I4891 Unspecified atrial fibrillation: Secondary | ICD-10-CM | POA: Diagnosis not present

## 2017-09-26 DIAGNOSIS — F418 Other specified anxiety disorders: Secondary | ICD-10-CM | POA: Diagnosis not present

## 2017-09-26 DIAGNOSIS — E7849 Other hyperlipidemia: Secondary | ICD-10-CM | POA: Diagnosis not present

## 2017-09-26 DIAGNOSIS — K5790 Diverticulosis of intestine, part unspecified, without perforation or abscess without bleeding: Secondary | ICD-10-CM | POA: Diagnosis not present

## 2017-09-26 DIAGNOSIS — E440 Moderate protein-calorie malnutrition: Secondary | ICD-10-CM | POA: Diagnosis not present

## 2017-09-26 DIAGNOSIS — E785 Hyperlipidemia, unspecified: Secondary | ICD-10-CM | POA: Diagnosis not present

## 2017-09-26 DIAGNOSIS — Z7951 Long term (current) use of inhaled steroids: Secondary | ICD-10-CM | POA: Diagnosis not present

## 2017-09-26 DIAGNOSIS — I249 Acute ischemic heart disease, unspecified: Secondary | ICD-10-CM | POA: Diagnosis not present

## 2017-09-26 DIAGNOSIS — E0500 Thyrotoxicosis with diffuse goiter without thyrotoxic crisis or storm: Secondary | ICD-10-CM | POA: Diagnosis not present

## 2017-09-26 DIAGNOSIS — F419 Anxiety disorder, unspecified: Secondary | ICD-10-CM | POA: Diagnosis not present

## 2017-09-26 DIAGNOSIS — K56609 Unspecified intestinal obstruction, unspecified as to partial versus complete obstruction: Secondary | ICD-10-CM | POA: Diagnosis not present

## 2017-09-26 DIAGNOSIS — I4892 Unspecified atrial flutter: Secondary | ICD-10-CM | POA: Diagnosis not present

## 2017-09-26 DIAGNOSIS — Z79891 Long term (current) use of opiate analgesic: Secondary | ICD-10-CM | POA: Diagnosis not present

## 2017-09-26 DIAGNOSIS — Z7901 Long term (current) use of anticoagulants: Secondary | ICD-10-CM | POA: Diagnosis not present

## 2017-09-26 DIAGNOSIS — T462X5D Adverse effect of other antidysrhythmic drugs, subsequent encounter: Secondary | ICD-10-CM | POA: Diagnosis not present

## 2017-09-26 DIAGNOSIS — K56699 Other intestinal obstruction unspecified as to partial versus complete obstruction: Secondary | ICD-10-CM | POA: Diagnosis not present

## 2017-09-26 DIAGNOSIS — I10 Essential (primary) hypertension: Secondary | ICD-10-CM | POA: Diagnosis not present

## 2017-09-27 ENCOUNTER — Ambulatory Visit (INDEPENDENT_AMBULATORY_CARE_PROVIDER_SITE_OTHER): Payer: PPO | Admitting: Physician Assistant

## 2017-09-27 ENCOUNTER — Encounter: Payer: Self-pay | Admitting: Physician Assistant

## 2017-09-27 VITALS — BP 126/68 | HR 64 | Ht 66.5 in | Wt 104.0 lb

## 2017-09-27 DIAGNOSIS — Z800 Family history of malignant neoplasm of digestive organs: Secondary | ICD-10-CM | POA: Diagnosis not present

## 2017-09-27 DIAGNOSIS — R1032 Left lower quadrant pain: Secondary | ICD-10-CM

## 2017-09-27 DIAGNOSIS — K5730 Diverticulosis of large intestine without perforation or abscess without bleeding: Secondary | ICD-10-CM

## 2017-09-27 DIAGNOSIS — K5900 Constipation, unspecified: Secondary | ICD-10-CM

## 2017-09-27 NOTE — Patient Instructions (Addendum)
If you are age 76 or older, your body mass index should be between 23-30. Your Body mass index is 16.53 kg/m. If this is out of the aforementioned range listed, please consider follow up with your Primary Care Provider.  Call us back in 2-3 weeks to make an appointment with Mike Gip PA for July.   Take Benefiber daily in a glass of water in the evening.  Take Miralax daily in a glass of prune juice.   Today take 2-3 doses of Miralax then repeat tomorrow.  ( one dose is 17 grams in 8 oz of water or Gatorade. )

## 2017-09-27 NOTE — Progress Notes (Signed)
Subjective:    Patient ID: [name] [name], female    DOB: 22-Aug-1940, 77 y.o.   MRN: [id number]  HPI [name] is a pleasant 77 year old white female, established with Dr. Myrtie Neither.  She was seen here by myself last in March 2018.  She has history of reticular disease, prior diverticulitis, chronic constipation, and history of small bowel obstruction.  She had undergone a laparoscopic lysis of adhesions in January 2018 after she had presented with partial small bowel obstruction involving the mid jejunum. Also with history of atrial fibrillation, hypertension and is status post mitral valve replacement.  She is maintained on Xarelto. Positive family history of colon cancer in her father. Last colonoscopy was done in October 2013 showing moderate diverticulosis and small internal hemorrhoids. Patient had recent ER visit last week after she presented with rapid atrial fibrillation and underwent an ablation on that same day.  She says she feels better and has not had a racing heart but thinks she might still be in atrial fibrillation. She came in here because of constipation she been present prior to the ER visit last week.  She says she is been drinking prune juice and eating multiple prunes per day and actually today for the first time in 5 or 6 days she did have a bowel movement in past several balls of stool.  She says her abdomen in general feels better.  She does not think she has diverticulitis.  She has not had any fever or chills.  She has had generalized lower abdominal discomfort which again has improved since bowel movement this morning.   Review of Systems Pertinent positive and negative review of systems were noted in the above HPI section.  All other review of systems was otherwise negative.  Outpatient Encounter Medications as of 09/27/2017  Medication Sig  . ALPRAZolam (XANAX) 0.25 MG tablet Take 0.5-1 tablets (0.125-0.25 mg total) by mouth 2 (two) times daily as needed for anxiety. For  anxiety (Patient taking differently: Take 0.125-0.25 mg by mouth at bedtime as needed for anxiety. )  . carvedilol (COREG) 3.125 MG tablet Take 1 tablet (3.125 mg total) by mouth 2 (two) times daily with a meal.  . cholecalciferol (VITAMIN D) 1000 units tablet Take 1,000 Units by mouth daily.   . fluticasone (FLONASE) 50 MCG/ACT nasal spray Place 1 spray into both nostrils daily as needed for allergies.   . Menthol, Topical Analgesic, (ICY HOT EX) Apply 1 application topically daily as needed (back pain).  . methimazole (TAPAZOLE) 5 MG tablet Take 5 mg by mouth daily.   . pantoprazole (PROTONIX) 40 MG tablet Take 40 mg by mouth daily.   . Rivaroxaban (XARELTO) 15 MG TABS tablet Take 1 tablet (15 mg total) by mouth daily with supper.  . traMADol (ULTRAM) 50 MG tablet Take 0.5 tablets (25 mg total) by mouth every 6 (six) hours as needed (pain). (Patient taking differently: Take 25 mg by mouth every 4 (four) hours as needed for moderate pain. )  . [DISCONTINUED] Rivaroxaban (XARELTO) 15 MG TABS tablet Take 1 tablet (15 mg total) by mouth daily with supper.   No facility-administered encounter medications on file as of 09/27/2017.    Allergies  Allergen Reactions  . Amiodarone Other (See Comments)    Hyperthyroidism   . Diltiazem Other (See Comments)    Put into hospital for 3 days  . Macrobid [Nitrofurantoin Monohyd Macro] Nausea Only  . Aspirin Rash    Tolerates low dose aspirin  . Naproxen  Sodium Swelling and Rash  . Sulfa Drugs Cross Reactors Other (See Comments)    Allergy per mother - unknown reaction   Patient Active Problem List   Diagnosis Date Noted  . Malnutrition of moderate degree 05/17/2016  . Bigeminy   . Intractable vomiting 05/15/2016  . Heart palpitations 05/15/2016  . SBO (small bowel obstruction) (HCC) 05/15/2016  . Acute coronary syndrome (HCC) 07/15/2015  . Status post Maze operation for atrial fibrillation 01/08/2013  . S/P MVR (mitral valve repair) 01/08/2013  .  Atrial flutter (HCC) 10/30/2012  . Amaurosis fugax 10/30/2012  . Dyspnea 10/28/2012  . 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 Thyroid goiter 08/21/2012  . Microscopic hematuria 08/20/2012  . S/P mitral valve repair 06/28/2012  . History of mitral valve prolapse with severe mitral regurgtation 05/19/2012  . Refusal of blood transfusions as patient is Jehovah's Witness 05/19/2012  . Atrial fibrillation (HCC) 05/16/2012  . Anxiety 04/27/2011  . Hypertension 04/26/2011  . Diverticulitis 04/24/2011  . Hyperlipidemia    Social History   Socioeconomic History  . Marital status: Divorced    Spouse name: Not on file  . Number of children: Not on file  . Years of education: Not on file  . Highest education level: Not on file  Occupational History  . Not on file  Social Needs  . Financial resource strain: Not on file  . Food insecurity:    Worry: Not on file    Inability: Not on file  . Transportation needs:    Medical: Not on file    Non-medical: Not on file  Tobacco Use  . Smoking status: Never Smoker  . Smokeless tobacco: Never Used  Substance and Sexual Activity  . Alcohol use: No    Comment: 1 drink a month  . Drug use: No  . Sexual activity: Never  Lifestyle  . Physical activity:    Days per week: Not on file    Minutes per session: Not on file  . Stress: Not on file  Relationships  . Social connections:    Talks on phone: Not on file    Gets together: Not on file    Attends religious service: Not on file    Active member of club or organization: Not on file    Attends meetings of clubs or organizations: Not on file    Relationship status: Not on file  . Intimate partner violence:    Fear of current or ex partner: Not on file    Emotionally abused: Not on file    Physically abused: Not on file    Forced sexual activity: Not on file  Other Topics Concern  . Not on file  Social History Narrative   Divorced - lives alone - remains functionally  independent.  Has 3 children.    Ms. Montoto family history includes Colon cancer in her father.      Objective:    Vitals:   09/27/17 0911  BP: 126/68  Pulse: 64    Physical Exam; well-developed elderly white female in no acute distress, very pleasant blood pressure 126/68 pulse 64, height 5 foot 6, weight 104, BMI 16.5.  HEENT; nontraumatic normocephalic EOMI PERRLA sclera anicteric, oropharynx clear, Cardiovascular; irregular rate and rhythm with S1-S2 no murmur rub or gallop, Pulmonary; clear bilaterally, Abdomen ;soft, bowel sounds are present, somewhat hyperactive she is nontender there is no palpable mass or hepatosplenomegaly, Rectal; exam not done, Extremities ;no clubbing cyanosis or edema skin warm and dry, Neuro psych; alert  and oriented, grossly nonfocal mood and affect appropriate       Assessment & Plan:   #74  77 year old white female comes in after recent episode of obstipation, and associated lower abdominal discomfort.  This is in the setting of chronic constipation and prior history of diverticulitis as well as prior small bowel obstruction. Her symptoms have improved over the past 24 hours and she has started laxatives and has started having bowel movements. I do not think she has acute diverticulitis at present.  Symptoms are not consistent with partial small bowel obstruction at present.  #2 history of atrial fibrillation-recent ER visit last week with rapid A. fib she is now status post ablation #3 chronic anticoagulation-on Xarelto #4 hypertension 5.  Status post mitral valve replacement 6.  family history of colon cancer- patient's father.  Patient is due for follow-up colonoscopy which was last done October 2013.  Plan; start MiraLAX 17 g in 8 ounces of water on a daily basis.  Asked patient to consider mixing this with a glass of prune juice daily. Add Benefiber 1 scoop daily in 8 ounces of water to be taken in the evening. Patient is asked to call should  she have any recurrence of lower abdominal pain and at that time would consider CT imaging. I will plan to follow her up in about 6 weeks and She will need to be scheduled for follow-up colonoscopy with Dr. Myrtie Neither, but I think it makes sense to wait she is still having cardiac issues with atrial fibrillation and mild hypotension intermittently.  Greater than 50% of visit spent in counseling regarding ongoing GI issues, and coordination of care.  Amy S Esterwood PA-C 09/27/2017   Cc: Geoffry Paradise, MD

## 2017-09-29 ENCOUNTER — Encounter: Payer: Self-pay | Admitting: Cardiology

## 2017-09-29 ENCOUNTER — Ambulatory Visit: Payer: PPO | Admitting: Cardiology

## 2017-09-29 VITALS — BP 132/78 | HR 92 | Ht 66.5 in | Wt 105.0 lb

## 2017-09-29 DIAGNOSIS — I428 Other cardiomyopathies: Secondary | ICD-10-CM | POA: Diagnosis not present

## 2017-09-29 DIAGNOSIS — I493 Ventricular premature depolarization: Secondary | ICD-10-CM | POA: Diagnosis not present

## 2017-09-29 DIAGNOSIS — I481 Persistent atrial fibrillation: Secondary | ICD-10-CM

## 2017-09-29 DIAGNOSIS — I4819 Other persistent atrial fibrillation: Secondary | ICD-10-CM

## 2017-09-29 NOTE — Patient Instructions (Signed)
Medication Instructions:  Your physician recommends that you continue on your current medications as directed. Please refer to the Current Medication list given to you today.  Labwork: None ordered     *We will only notify you of abnormal results, otherwise continue current treatment plan.  Testing/Procedures: None ordered  Follow-Up: Your physician wants you to follow-up in: 6 months  with Dr. Camnitz.  You will receive a reminder letter in the mail two months in advance. If you don't receive a letter, please call our office to schedule the follow-up appointment.   * If you need a refill on your cardiac medications before your next appointment, please call your pharmacy.   *Please note that any paperwork needing to be filled out by the provider will need to be addressed at the front desk prior to seeing the provider. Please note that any FMLA, disability or other documents regarding health condition is subject to a $25.00 charge that must be received prior to completion of paperwork in the form of a money order or check.  Thank you for choosing [facility]!!   Izamar Linden, RN [phone]      

## 2017-09-29 NOTE — Progress Notes (Signed)
Thank you for sending this case to me. I have reviewed the entire note, and the outlined plan seems appropriate.   Jillianna Stanek Danis, MD  

## 2017-09-29 NOTE — Progress Notes (Signed)
Electrophysiology Office Note   Date:  09/29/2017   ID:  [name] [name], DOB [birth year], MRN [id number]  PCP:  Geoffry Paradise, MD  Cardiologist:  Jacinto Halim Primary Electrophysiologist:  Antwyne Pingree Jorja Loa, MD    No chief complaint on file.    History of Present Illness: [name] [name] is a 77 y.o. female who is being seen today for the evaluation of PVCs at the request of Yates Decamp. Presenting today for electrophysiology evaluation.  She has a history of hyperlipidemia, hypertension, mitral valve repair and left atrial appendage clipping with maze in 2014.  Coronary angiogram May 2017 was normal.  She has a history of amiodarone induced thyroiditis.  She had an echo performed February 2019 with severe LV systolic dysfunction.  June 2017 showed no LV systolic dysfunction.  She was having 22% PVCs which were monomorphic.  She was put on Coreg which improved her symptoms.  Today, denies symptoms of palpitations, chest pain, shortness of breath, orthopnea, PND, lower extremity edema, claudication, dizziness, presyncope, syncope, bleeding, or neurologic sequela. The patient is tolerating medications without difficulties.  She is feeling well.  She was in the hospital late May 2019 with atrial fibrillation.  She was cardioverted and was in sinus rhythm, but has quickly reverted likely back to atrial fibrillation.  She is mildly weak and fatigued but otherwise feels well.  Past Medical History:  Diagnosis Date  . Acute coronary syndrome (HCC) 06/2015  . 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 Anemia   . Anxiety   . Aortic stenosis 05/19/2012   trivial by TEE  . Atrial fibrillation (HCC)    a. Dx 04/2012;  b. Amio d/c'd 2/2 hyperthyroidism;  c. Atrial flutter 10/2012;  d. On xarelto.  [name]   . CHF (congestive heart failure) (HCC)   . Complication of anesthesia   . Coronary artery disease   . Diverticulitis   . GERD (gastroesophageal reflux disease)   . Goiter   . Headache     . Heart murmur   . Hyperlipidemia    recently- taken off Crestor- due to pain in her legs, but this was after cardiac cath. , pt. questioning whether the pain in her legs was related to cath. or crestor  . Hypertension    Then hypotension 04/2012 limiting med adjustment.  . Intractable vomiting   . Malnutrition of moderate degree (HCC)   . Microscopic hematuria 08/20/2012  . Mitral valve prolapse    a. H/o MVP, with severe MR 04/2012, had MV repair and Maze  . Pneumonia    treated /w antibiotic- [facility]- early Feb. 2014  . PONV (postoperative nausea and vomiting)   . Refusal of blood transfusions as patient is Jehovah's Witness   . S/P Maze operation for atrial fibrillation 06/28/2012   Complete bilateral atrial lesion set using cryothermy ablation with oversewing of LA appendage via right minithoracotomy  . S/P mitral valve repair 06/28/2012   Complex valvuloplasty including quadrangular resection of posterior leaflet, sliding leaflet plasty, artificial Gore-tex neocord placement x2 and 15mm Sorin Memo 3D ring annuloplasty via right mini thoracotomy   . SBO (small bowel obstruction) (HCC)   . Shortness of breath    Past Surgical History:  Procedure Laterality Date  . CARDIAC CATHETERIZATION N/A 07/16/2015   Procedure: Left Heart Cath and Coronary Angiography;  Surgeon: Orpah Cobb, MD;  Location: [facility] INVASIVE CV LAB;  Service: Cardiovascular;  Laterality: N/A;  . CORONARY ARTERY BYPASS GRAFT    . INTRAOPERATIVE TRANSESOPHAGEAL ECHOCARDIOGRAM  N/A 06/28/2012   Procedure: INTRAOPERATIVE TRANSESOPHAGEAL ECHOCARDIOGRAM;  Surgeon: Purcell Nails, MD;  Location: [facility] OR;  Service: Open Heart Surgery;  Laterality: N/A;  . LAPAROSCOPY N/A 05/21/2016   Procedure: LAPAROSCOPY DIAGNOSTIC, LYSIS OF ADHESION BAND;  Surgeon: Axel Filler, MD;  Location: [facility] OR;  Service: General;  Laterality: N/A;  . LEFT AND RIGHT HEART CATHETERIZATION WITH CORONARY ANGIOGRAM N/A 05/22/2012   Procedure: LEFT AND RIGHT HEART  CATHETERIZATION WITH CORONARY ANGIOGRAM;  Surgeon: Kathleene Hazel, MD;  Location[facility] C[facility] LAB;  Service: Cardiovascular;  Laterality: N/A;  . MINIMALLY INVASIVE MAZE PROCEDURE N/A 06/28/2012   Procedure: MINIMALLY INVASIVE MAZE PROCEDURE;  Surgeon: Purcell Nails, MD;  Location: [facility] OR;  Service: Open Heart Surgery;  Laterality: N/A;  . MITRAL VALVE REPAIR Right 06/28/2012   Procedure: MINIMALLY INVASIVE MITRAL VALVE REPAIR (MVR);  Surgeon: Purcell Nails, MD;  Location[facility] OR;  Service: Open Heart Surgery;  Laterality: Right;  . OVARIAN CYST SURGERY  1988  . TEE WITHOUT CARDIOVERSION  05/19/2012   Procedure: TRANSESOPHAGEAL ECHOCARDIOGRAM (TEE);  Surgeon: Dolores Patty, MD;  Location: [facility] ENDOSCOPY;  Service: Cardiovascular;  Laterality: N/A;  . TUBAL LIGATION  1974  . URETHRAL STRICTURE DILATATION    . UTERINE FIBROID SURGERY  1988  . `       Current Outpatient Medications  Medication Sig Dispense Refill  . ALPRAZolam (XANAX) 0.25 MG tablet Take 0.25 mg by mouth as directed.    . carvedilol (COREG) 3.125 MG tablet Take 1 tablet (3.125 mg total) by mouth 2 (two) times daily with a meal. 60 tablet 3  . cholecalciferol (VITAMIN D) 1000 units tablet Take 1,000 Units by mouth daily.     . fluticasone (FLONASE) 50 MCG/ACT nasal spray Place 1 spray into both nostrils daily as needed for allergies.     . Menthol, Topical Analgesic, (ICY HOT EX) Apply 1 application topically daily as needed (back pain).    . methimazole (TAPAZOLE) 5 MG tablet Take 5 mg by mouth daily.     . pantoprazole (PROTONIX) 40 MG tablet Take 40 mg by mouth daily.     . Rivaroxaban (XARELTO) 15 MG TABS tablet Take 1 tablet (15 mg total) by mouth daily with supper. 30 tablet 6  . traMADol (ULTRAM) 50 MG tablet Take by mouth as directed.     No current facility-administered medications for this visit.     Allergies:   Amiodarone; Diltiazem; Macrobid [nitrofurantoin monohyd macro]; Aspirin; Naproxen sodium; and  Sulfa drugs cross reactors   Social History:  The patient  reports that she has never smoked. She has never used smokeless tobacco. She reports that she does not drink alcohol or use drugs.   Family History:  The patient's family history includes Colon cancer in her father.    ROS:  Please see the history of present illness.   Otherwise, review of systems is positive for weight loss, palpitations.   All other systems are reviewed and negative.   PHYSICAL EXAM: VS:  BP 132/78   Pulse 92   Ht 5' 6.5" (1.689 m)   Wt 105 lb (47.6 kg)   SpO2 97%   BMI 16.69 kg/m  , BMI Body mass index is 16.69 kg/m. GEN: Well nourished, well developed, in no acute distress  HEENT: normal  Neck: no JVD, carotid bruits, or masses Cardiac: iRRR; no murmurs, rubs, or gallops,no edema  Respiratory:  clear to auscultation bilaterally, normal work of breathing GI: soft, nontender, nondistended, +  BS MS: no deformity or atrophy  Skin: warm and dry Neuro:  Strength and sensation are intact Psych: euthymic mood, full affect  EKG:  EKG is not ordered today. Personal review of the ekg ordered 09/20/17 shows AF, rate 115, PVCs, LAFB  Recent Labs: 09/20/2017: BUN 12; Creatinine, Ser 0.84; Hemoglobin 13.4; Platelets 226; Potassium 4.6; Sodium 140; TSH 2.254    Lipid Panel     Component Value Date/Time   CHOL 226 (H) 07/16/2015 0557   TRIG 74 07/16/2015 0557   HDL 45 07/16/2015 0557   CHOLHDL 5.0 07/16/2015 0557   VLDL 15 07/16/2015 0557   LDLCALC 166 (H) 07/16/2015 0557     Wt Readings from Last 3 Encounters:  09/29/17 105 lb (47.6 kg)  09/27/17 104 lb (47.2 kg)  08/16/17 107 lb (48.5 kg)      Other studies Reviewed: Additional studies/ records that were reviewed today include: TTE 06/01/2017 Review of the above records today demonstrates:  LV cavity size normal, ejection fraction 35 to 40%.  No wall motion abnormalities. Mildly dilated right atrium Mild aortic regurgitation Mitral annuloplasty  ring in place.  Restricted movement of posterior leaflet.  Mean gradient 4 mmHg. Mild tricuspid regurgitation.  Estimated PA pressure 22 mmHg.   ASSESSMENT AND PLAN:  1.  PVCs: 29% on cardiac monitor.  I discussed with her options of antiarrhythmic drugs, but she is hesitant to start anything.  We Kaylor Simenson continue with current management.  2.  Chronic systolic heart failure due to nonischemic cardiomyopathy: Possibly due to elevated PVC burden.  We did discuss options of antiarrhythmic medications.  It appears that her only medication to reduce her PVC burden would be sotalol.  She is also hesitant to start blood pressure medication at as she says that her blood pressure and pulse are dropping in the afternoons.  No changes.  3.  Paroxysmal atrial fibrillation: Status post maze and left atrial appendage clipping at the time of her mitral valve surgery.  She is likely in atrial fibrillation today based on auscultation.  Her heart rate is fast at 92.  She is minimally symptomatic from this.  At this point, I do not feel that it is necessary to have a rhythm control strategy and she is hesitant to start medications.  I did offer her loading with dofetilide which she would like to think about.  She is also not taking her carvedilol.  Blood pressure being a low in the afternoon.  Could potentially start her on low-dose Bystolic.  She Jasmina Gendron think about this and call us back.  This patients CHA2DS2-VASc Score and unadjusted Ischemic Stroke Rate (% per year) is equal to 3.2 % stroke rate/year from a score of 3  Above score calculated as 1 point each if present [CHF, HTN, DM, Vascular=MI/PAD/Aortic Plaque, Age if 65-74, or Female] Above score calculated as 2 points each if present [Age > 75, or Stroke/TIA/TE]   Current medicines are reviewed at length with the patient today.   The patient does not have concerns regarding her medicines.  The following changes were made today: None  Labs/ tests ordered today  include:  No orders of the defined types were placed in this encounter.  Disposition:   FU with Iasia Forcier 6 months  Signed, Gawain Crombie Jorja Loa, MD  09/29/2017 2:31 PM     [facility] [address] [place] [address] [phone] (office) [phone] (fax)

## 2017-09-30 ENCOUNTER — Other Ambulatory Visit: Payer: Self-pay | Admitting: *Deleted

## 2017-09-30 ENCOUNTER — Encounter: Payer: Self-pay | Admitting: *Deleted

## 2017-09-30 NOTE — Patient Outreach (Signed)
[facility]) Care Management  09/30/2017  [name] [name] [birth year] [id number]  Referral via [facility]; Reason: requesting medication co pay assistance:  Telephone call to patient; left HIPPA compliant voice mail requesting call back.   Plan: Furniture conservator/restorer. Follow up 2-4 business days.  Colleen Can, RN BSN CCM Care Management Coordinator [facility] Care Management  [phone]

## 2017-10-03 ENCOUNTER — Telehealth: Payer: Self-pay | Admitting: Cardiology

## 2017-10-03 NOTE — Telephone Encounter (Signed)
Spoke to patient in regards to her dizziness and faint feeling.  She took her carvedilol this morning at 8:15 and shortly afterward began feeling faint so she sat down.  Her HR has fluctuated between 45-120 over the weekend.    She said that she was supposed to monitor herself over the weekend and call today to see if we needed to start Bystolic, not prescribed yet. Her present BP is 127/57 HR 74.  Please advise, thank you.

## 2017-10-03 NOTE — Telephone Encounter (Signed)
Currently pt reports feeling better and not like she did this morning.  Reports BP/HR this morning was: 131/69, 102 142/80, 120 148/93, 122 Pt reports taking an "extra Carvedilol" this morning when her HR wouldn't come down.  States that she started feeling anxious when she felt elevated HR. States that she thinks that she just got up too fast this morning after taking the increased dose. Her BP was 135/67, HR 120 when she took extra Carvedilol.  30 min later BP was 127/54, HR 74. Advised pt not to take an extra dose of Carvedilol without consulting office. Advised to take prescribed dose tonight and tomorrow morning to see how she does on correct dosage. Advised to call the office if she experiences any elevated HR/BP. Advised that if this doesn't work to control HR, then we will be willing to switch to low dose Bystolic to see how she responds.  Pt made aware Dr. Elberta Fortis is agreeable to this plan. Patient verbalized understanding and agreeable to plan.

## 2017-10-03 NOTE — Telephone Encounter (Signed)
New Message  STAT if patient feels like he/she is going to faint   1) Are you dizzy now? yes  2) Do you feel faint or have you passed out? Feels faint  3) Do you have any other symptoms? Thinks bp dropped too long but not sure   4) Have you checked your HR and BP (record if available)? Not sure

## 2017-10-06 ENCOUNTER — Other Ambulatory Visit: Payer: Self-pay | Admitting: *Deleted

## 2017-10-06 DIAGNOSIS — I428 Other cardiomyopathies: Secondary | ICD-10-CM | POA: Diagnosis not present

## 2017-10-06 DIAGNOSIS — I493 Ventricular premature depolarization: Secondary | ICD-10-CM | POA: Diagnosis not present

## 2017-10-06 DIAGNOSIS — I480 Paroxysmal atrial fibrillation: Secondary | ICD-10-CM | POA: Diagnosis not present

## 2017-10-06 DIAGNOSIS — I5042 Chronic combined systolic (congestive) and diastolic (congestive) heart failure: Secondary | ICD-10-CM | POA: Diagnosis not present

## 2017-10-06 NOTE — Patient Outreach (Signed)
[facility]) Care Management  10/06/2017  FABIAN ALMENDINGER 1940/05/19 [id number]   Referral via [facility]; Reason: requesting medication co pay assistance:  Per chart patient had ED visit 5/28./2019  Telephone call attempt x 2; left HIPPA compliant  voice mail requesting call back.   Plan:  Follow up in 2-4 business days.   Colleen Can, RN BSN CCM Care Management Coordinator [facility] Care Management  [phone]

## 2017-10-12 ENCOUNTER — Other Ambulatory Visit: Payer: Self-pay | Admitting: *Deleted

## 2017-10-12 ENCOUNTER — Encounter: Payer: Self-pay | Admitting: *Deleted

## 2017-10-12 NOTE — Patient Outreach (Signed)
[facility]) Care Management  10/12/2017  PERCY LUDWICK 28-Jun-1940 [id number]   Referral via [facility]; Reason: requesting medication co pay assistance:  Telephone call #2 to patient; left HIPPA compliant phone mail requesting call back.  Plan: Furniture conservator/restorer. Follow up call in 2-4 days.  Colleen Can, RN BSN CCM Care Management Coordinator [facility] Care Management  [phone]

## 2017-10-17 DIAGNOSIS — M545 Low back pain: Secondary | ICD-10-CM | POA: Diagnosis not present

## 2017-10-17 DIAGNOSIS — I341 Nonrheumatic mitral (valve) prolapse: Secondary | ICD-10-CM | POA: Diagnosis not present

## 2017-10-17 DIAGNOSIS — I10 Essential (primary) hypertension: Secondary | ICD-10-CM | POA: Diagnosis not present

## 2017-10-17 DIAGNOSIS — F418 Other specified anxiety disorders: Secondary | ICD-10-CM | POA: Diagnosis not present

## 2017-10-17 DIAGNOSIS — E0590 Thyrotoxicosis, unspecified without thyrotoxic crisis or storm: Secondary | ICD-10-CM | POA: Diagnosis not present

## 2017-10-17 DIAGNOSIS — I480 Paroxysmal atrial fibrillation: Secondary | ICD-10-CM | POA: Diagnosis not present

## 2017-10-17 DIAGNOSIS — M810 Age-related osteoporosis without current pathological fracture: Secondary | ICD-10-CM | POA: Diagnosis not present

## 2017-10-17 DIAGNOSIS — E042 Nontoxic multinodular goiter: Secondary | ICD-10-CM | POA: Diagnosis not present

## 2017-10-17 DIAGNOSIS — F1320 Sedative, hypnotic or anxiolytic dependence, uncomplicated: Secondary | ICD-10-CM | POA: Diagnosis not present

## 2017-10-17 DIAGNOSIS — I5020 Unspecified systolic (congestive) heart failure: Secondary | ICD-10-CM | POA: Diagnosis not present

## 2017-10-17 DIAGNOSIS — E7849 Other hyperlipidemia: Secondary | ICD-10-CM | POA: Diagnosis not present

## 2017-10-17 DIAGNOSIS — Z681 Body mass index (BMI) 19 or less, adult: Secondary | ICD-10-CM | POA: Diagnosis not present

## 2017-10-18 ENCOUNTER — Other Ambulatory Visit: Payer: Self-pay | Admitting: Internal Medicine

## 2017-10-18 ENCOUNTER — Other Ambulatory Visit: Payer: Self-pay | Admitting: *Deleted

## 2017-10-18 DIAGNOSIS — E042 Nontoxic multinodular goiter: Secondary | ICD-10-CM

## 2017-10-18 NOTE — Patient Outreach (Signed)
[facility]) Care Management  10/18/2017  [name] [name] October 03, 1940 [id number]  Referral via [facility]; Reason: requesting medication co pay assistance:  Telephone call #3 to patient; left HIPPA compliant voice mail requesting call back.  Plan: Outreach letter sent 6/7. Will follow up 3 business days.  Colleen Can, RN BSN CCM Care Management Coordinator [facility] Care Management  [phone]

## 2017-10-19 ENCOUNTER — Ambulatory Visit
Admission: RE | Admit: 2017-10-19 | Discharge: 2017-10-19 | Disposition: A | Discharge status: Home / Self Care | Payer: PPO | Source: Ambulatory Visit | Attending: Internal Medicine | Admitting: Internal Medicine

## 2017-10-19 DIAGNOSIS — E042 Nontoxic multinodular goiter: Secondary | ICD-10-CM

## 2017-10-21 ENCOUNTER — Other Ambulatory Visit: Payer: Self-pay | Admitting: Internal Medicine

## 2017-10-21 DIAGNOSIS — E042 Nontoxic multinodular goiter: Secondary | ICD-10-CM

## 2017-10-25 ENCOUNTER — Other Ambulatory Visit: Payer: Self-pay | Admitting: *Deleted

## 2017-10-25 NOTE — Patient Outreach (Signed)
[facility]) Care Management  10/25/2017  [name] [name] 1941/01/02 [id number]  Unsuccessful call attempts x 4.  No response to outreach letter.  Plan: Case closure.  Colleen Can, RN BSN CCM Care Management Coordinator [facility] Care Management  [phone]

## 2017-10-26 ENCOUNTER — Telehealth: Payer: Self-pay | Admitting: Cardiology

## 2017-10-26 NOTE — Telephone Encounter (Signed)
   [facility] Pre-operative Risk Assessment    Request for surgical clearance:  1. What type of surgery is being performed?  Thyroid Biopsy   2. When is this surgery scheduled?  TBD   3. What type of clearance is required (medical clearance vs. Pharmacy clearance to hold med vs. Both)?  Medication  4. Are there any medications that need to be held prior to surgery and how long? Xarelto for 1 day   5. Practice name and name of physician performing surgery?  [facility]   6. What is your office phone number? [phone]    7.   What is your office fax number? [phone]  8.   Anesthesia type (None, local, MAC, general) ? Not mentioned    _________________________________________________________________   (provider comments below)

## 2017-10-28 DIAGNOSIS — Z79899 Other long term (current) drug therapy: Secondary | ICD-10-CM | POA: Diagnosis not present

## 2017-10-28 DIAGNOSIS — M545 Low back pain: Secondary | ICD-10-CM | POA: Diagnosis not present

## 2017-10-28 DIAGNOSIS — Z681 Body mass index (BMI) 19 or less, adult: Secondary | ICD-10-CM | POA: Diagnosis not present

## 2017-10-28 DIAGNOSIS — M1990 Unspecified osteoarthritis, unspecified site: Secondary | ICD-10-CM | POA: Diagnosis not present

## 2017-10-28 NOTE — Telephone Encounter (Signed)
Pt takes Xarelto for afib with CHADS2VASc score of (age x2, sex, HTN, CHF, CAD). CrCl is 67mL/min, appropriately taking Xarelto 15mg  daily. Ok to hold Xarelto 1 day as requested prior to procedure.

## 2017-10-28 NOTE — Telephone Encounter (Signed)
Pt followed by Dr Jacinto Halim- please forward to Dr Jacinto Halim for clearance. Pharmacy recommendations noted- OK to hold Xarelto one day pre op.  Corine Shelter PA-C 10/28/2017 1:54 PM

## 2017-11-10 ENCOUNTER — Other Ambulatory Visit: Payer: Self-pay | Admitting: Internal Medicine

## 2017-11-10 ENCOUNTER — Ambulatory Visit
Admission: RE | Admit: 2017-11-10 | Discharge: 2017-11-10 | Disposition: A | Discharge status: Home / Self Care | Payer: PPO | Source: Ambulatory Visit | Attending: Internal Medicine | Admitting: Internal Medicine

## 2017-11-10 DIAGNOSIS — E042 Nontoxic multinodular goiter: Secondary | ICD-10-CM

## 2017-11-10 DIAGNOSIS — E041 Nontoxic single thyroid nodule: Secondary | ICD-10-CM | POA: Diagnosis not present

## 2017-11-17 ENCOUNTER — Other Ambulatory Visit: Payer: Self-pay | Admitting: Internal Medicine

## 2017-11-17 DIAGNOSIS — R634 Abnormal weight loss: Secondary | ICD-10-CM

## 2017-11-24 ENCOUNTER — Other Ambulatory Visit: Payer: Self-pay | Admitting: Internal Medicine

## 2017-11-24 DIAGNOSIS — R634 Abnormal weight loss: Secondary | ICD-10-CM

## 2017-11-25 DIAGNOSIS — E0500 Thyrotoxicosis with diffuse goiter without thyrotoxic crisis or storm: Secondary | ICD-10-CM | POA: Diagnosis not present

## 2017-11-25 DIAGNOSIS — F418 Other specified anxiety disorders: Secondary | ICD-10-CM | POA: Diagnosis not present

## 2017-11-25 DIAGNOSIS — K5790 Diverticulosis of intestine, part unspecified, without perforation or abscess without bleeding: Secondary | ICD-10-CM | POA: Diagnosis not present

## 2017-11-25 DIAGNOSIS — I10 Essential (primary) hypertension: Secondary | ICD-10-CM | POA: Diagnosis not present

## 2017-11-25 DIAGNOSIS — Z7901 Long term (current) use of anticoagulants: Secondary | ICD-10-CM | POA: Diagnosis not present

## 2017-11-25 DIAGNOSIS — T462X5D Adverse effect of other antidysrhythmic drugs, subsequent encounter: Secondary | ICD-10-CM | POA: Diagnosis not present

## 2017-11-25 DIAGNOSIS — I248 Other forms of acute ischemic heart disease: Secondary | ICD-10-CM | POA: Diagnosis not present

## 2017-11-25 DIAGNOSIS — I4892 Unspecified atrial flutter: Secondary | ICD-10-CM | POA: Diagnosis not present

## 2017-11-25 DIAGNOSIS — E7849 Other hyperlipidemia: Secondary | ICD-10-CM | POA: Diagnosis not present

## 2017-11-25 DIAGNOSIS — E440 Moderate protein-calorie malnutrition: Secondary | ICD-10-CM | POA: Diagnosis not present

## 2017-11-25 DIAGNOSIS — K56699 Other intestinal obstruction unspecified as to partial versus complete obstruction: Secondary | ICD-10-CM | POA: Diagnosis not present

## 2017-11-25 DIAGNOSIS — I4891 Unspecified atrial fibrillation: Secondary | ICD-10-CM | POA: Diagnosis not present

## 2017-11-30 ENCOUNTER — Other Ambulatory Visit: Payer: Self-pay

## 2017-12-05 ENCOUNTER — Ambulatory Visit
Admission: RE | Admit: 2017-12-05 | Discharge: 2017-12-05 | Disposition: A | Discharge status: Home / Self Care | Payer: PPO | Source: Ambulatory Visit | Attending: Internal Medicine | Admitting: Internal Medicine

## 2017-12-05 DIAGNOSIS — R634 Abnormal weight loss: Secondary | ICD-10-CM

## 2017-12-05 DIAGNOSIS — N289 Disorder of kidney and ureter, unspecified: Secondary | ICD-10-CM | POA: Diagnosis not present

## 2017-12-05 DIAGNOSIS — I700 Atherosclerosis of aorta: Secondary | ICD-10-CM | POA: Diagnosis not present

## 2017-12-05 MED ORDER — IOPAMIDOL (ISOVUE-300) INJECTION 61%
100.0000 mL | Freq: Once | INTRAVENOUS | Status: AC | PRN
  Administered 2017-12-05: 100 mL via INTRAVENOUS

## 2017-12-30 DIAGNOSIS — R630 Anorexia: Secondary | ICD-10-CM | POA: Diagnosis not present

## 2017-12-30 DIAGNOSIS — E041 Nontoxic single thyroid nodule: Secondary | ICD-10-CM | POA: Diagnosis not present

## 2018-01-13 DIAGNOSIS — I493 Ventricular premature depolarization: Secondary | ICD-10-CM | POA: Diagnosis not present

## 2018-01-13 DIAGNOSIS — I480 Paroxysmal atrial fibrillation: Secondary | ICD-10-CM | POA: Diagnosis not present

## 2018-01-13 DIAGNOSIS — I428 Other cardiomyopathies: Secondary | ICD-10-CM | POA: Diagnosis not present

## 2018-01-13 DIAGNOSIS — I5042 Chronic combined systolic (congestive) and diastolic (congestive) heart failure: Secondary | ICD-10-CM | POA: Diagnosis not present

## 2018-01-21 DIAGNOSIS — Z23 Encounter for immunization: Secondary | ICD-10-CM | POA: Diagnosis not present

## 2018-03-01 DIAGNOSIS — I428 Other cardiomyopathies: Secondary | ICD-10-CM | POA: Diagnosis not present

## 2018-03-01 DIAGNOSIS — I480 Paroxysmal atrial fibrillation: Secondary | ICD-10-CM | POA: Diagnosis not present

## 2018-03-01 DIAGNOSIS — I493 Ventricular premature depolarization: Secondary | ICD-10-CM | POA: Diagnosis not present

## 2018-03-01 DIAGNOSIS — I5042 Chronic combined systolic (congestive) and diastolic (congestive) heart failure: Secondary | ICD-10-CM | POA: Diagnosis not present

## 2018-03-02 DIAGNOSIS — I480 Paroxysmal atrial fibrillation: Secondary | ICD-10-CM | POA: Diagnosis not present

## 2018-03-02 DIAGNOSIS — I493 Ventricular premature depolarization: Secondary | ICD-10-CM | POA: Diagnosis not present

## 2018-03-06 DIAGNOSIS — I5042 Chronic combined systolic (congestive) and diastolic (congestive) heart failure: Secondary | ICD-10-CM | POA: Diagnosis not present

## 2018-03-09 ENCOUNTER — Other Ambulatory Visit (HOSPITAL_COMMUNITY): Payer: Self-pay | Admitting: Internal Medicine

## 2018-03-09 DIAGNOSIS — R51 Headache: Secondary | ICD-10-CM | POA: Diagnosis not present

## 2018-03-09 DIAGNOSIS — R519 Headache, unspecified: Secondary | ICD-10-CM

## 2018-03-09 DIAGNOSIS — Z681 Body mass index (BMI) 19 or less, adult: Secondary | ICD-10-CM | POA: Diagnosis not present

## 2018-03-09 DIAGNOSIS — I480 Paroxysmal atrial fibrillation: Secondary | ICD-10-CM | POA: Diagnosis not present

## 2018-03-09 DIAGNOSIS — R479 Unspecified speech disturbances: Secondary | ICD-10-CM

## 2018-03-10 ENCOUNTER — Ambulatory Visit (HOSPITAL_COMMUNITY)
Admission: RE | Admit: 2018-03-10 | Discharge: 2018-03-10 | Disposition: A | Discharge status: Home / Self Care | Payer: PPO | Source: Ambulatory Visit | Attending: Internal Medicine | Admitting: Internal Medicine

## 2018-03-10 DIAGNOSIS — I480 Paroxysmal atrial fibrillation: Secondary | ICD-10-CM | POA: Diagnosis not present

## 2018-03-10 DIAGNOSIS — I428 Other cardiomyopathies: Secondary | ICD-10-CM | POA: Diagnosis not present

## 2018-03-10 DIAGNOSIS — I493 Ventricular premature depolarization: Secondary | ICD-10-CM | POA: Diagnosis not present

## 2018-03-10 DIAGNOSIS — R4789 Other speech disturbances: Secondary | ICD-10-CM | POA: Diagnosis not present

## 2018-03-10 DIAGNOSIS — R519 Headache, unspecified: Secondary | ICD-10-CM

## 2018-03-10 DIAGNOSIS — R51 Headache: Secondary | ICD-10-CM | POA: Insufficient documentation

## 2018-03-10 DIAGNOSIS — R479 Unspecified speech disturbances: Secondary | ICD-10-CM | POA: Diagnosis not present

## 2018-03-10 DIAGNOSIS — I5042 Chronic combined systolic (congestive) and diastolic (congestive) heart failure: Secondary | ICD-10-CM | POA: Diagnosis not present

## 2018-03-29 ENCOUNTER — Encounter: Payer: Self-pay | Admitting: Cardiology

## 2018-03-29 ENCOUNTER — Ambulatory Visit: Payer: PPO | Admitting: Cardiology

## 2018-03-29 VITALS — BP 150/72 | HR 83 | Ht 66.5 in | Wt 102.0 lb

## 2018-03-29 DIAGNOSIS — I4819 Other persistent atrial fibrillation: Secondary | ICD-10-CM | POA: Diagnosis not present

## 2018-03-29 DIAGNOSIS — I493 Ventricular premature depolarization: Secondary | ICD-10-CM | POA: Diagnosis not present

## 2018-03-29 DIAGNOSIS — I5022 Chronic systolic (congestive) heart failure: Secondary | ICD-10-CM | POA: Diagnosis not present

## 2018-03-29 NOTE — Patient Instructions (Signed)
Medication Instructions:  Your physician has recommended you make the following change in your medication: 1. START Mexiletine 250 mg twice a day  * If you need a refill on your cardiac medications before your next appointment, please call your pharmacy.   Labwork: None ordered  Testing/Procedures: None ordered  Follow-Up: Your physician recommends that you schedule a follow-up appointment in: 3 months with Dr. Elberta Fortis.   *Please note that any paperwork needing to be filled out by the provider will need to be addressed at the front desk prior to seeing the provider. Please note that any FMLA, disability or other documents regarding health condition is subject to a $25.00 charge that must be received prior to completion of paperwork in the form of a money order or check.  Thank you for choosing [facility]!!   Dory Horn, RN [phone]

## 2018-03-29 NOTE — Progress Notes (Signed)
Electrophysiology Office Note   Date:  03/29/2018   ID:  [name] [name], DOB 05-05-1940, MRN [id number]  PCP:  Geoffry Paradise, MD  Cardiologist:  Jacinto Halim Primary Electrophysiologist:  Sonda Coppens Jorja Loa, MD    No chief complaint on file.    History of Present Illness: [name] [name] is a 77 y.o. female who is being seen today for the evaluation of PVCs at the request of Yates Decamp. Presenting today for electrophysiology evaluation.  She has a history of hyperlipidemia, hypertension, mitral valve repair and left atrial appendage clipping with maze in 2014.  Coronary angiogram May 2017 was normal.  She has a history of amiodarone induced thyroiditis.  She had an echo performed February 2019 with severe LV systolic dysfunction.  June 2017 showed no LV systolic dysfunction.  She was having 22% PVCs which were monomorphic.  She was put on Coreg which improved her symptoms.  Today, denies symptoms of palpitations, chest pain, shortness of breath, orthopnea, PND, lower extremity edema, claudication, dizziness, presyncope, syncope, bleeding, or neurologic sequela. The patient is tolerating medications without difficulties.  Her main complaints today are of weakness and fatigue.  She has a high burden of PVCs up to 30% on her cardiac monitor.  She has been resistant to therapy, but she does have a low ejection fraction.  Past Medical History:  Diagnosis Date  . Acute coronary syndrome (HCC) 06/2015  . 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 Anemia   . Anxiety   . Aortic stenosis 05/19/2012   trivial by TEE  . Atrial fibrillation (HCC)    a. Dx 04/2012;  b. Amio d/c'd 2/2 hyperthyroidism;  c. Atrial flutter 10/2012;  d. On xarelto.  [name] [name]   . CHF (congestive heart failure) (HCC)   . Complication of anesthesia   . Coronary artery disease   . Diverticulitis   . GERD (gastroesophageal reflux disease)   . Goiter   . Headache   . Heart murmur   . Hyperlipidemia    recently-  taken off Crestor- due to pain in her legs, but this was after cardiac cath. , pt. questioning whether the pain in her legs was related to cath. or crestor  . Hypertension    Then hypotension 04/2012 limiting med adjustment.  . Intractable vomiting   . Malnutrition of moderate degree (HCC)   . Microscopic hematuria 08/20/2012  . Mitral valve prolapse    a. H/o MVP, with severe MR 04/2012, had MV repair and Maze  . Pneumonia    treated /w antibiotic- [facility]- early Feb. 2014  . PONV (postoperative nausea and vomiting)   . Refusal of blood transfusions as patient is Jehovah's Witness   . S/P Maze operation for atrial fibrillation 06/28/2012   Complete bilateral atrial lesion set using cryothermy ablation with oversewing of LA appendage via right minithoracotomy  . S/P mitral valve repair 06/28/2012   Complex valvuloplasty including quadrangular resection of posterior leaflet, sliding leaflet plasty, artificial Gore-tex neocord placement x2 and 64mm Sorin Memo 3D ring annuloplasty via right mini thoracotomy   . SBO (small bowel obstruction) (HCC)   . Shortness of breath    Past Surgical History:  Procedure Laterality Date  . CARDIAC CATHETERIZATION N/A 07/16/2015   Procedure: Left Heart Cath and Coronary Angiography;  Surgeon: Orpah Cobb, MD;  Location: [facility] INVASIVE CV LAB;  Service: Cardiovascular;  Laterality: N/A;  . CORONARY ARTERY BYPASS GRAFT    . INTRAOPERATIVE TRANSESOPHAGEAL ECHOCARDIOGRAM N/A 06/28/2012   Procedure: INTRAOPERATIVE TRANSESOPHAGEAL  ECHOCARDIOGRAM;  Surgeon: Purcell Nails, MD;  Location: [facility] OR;  Service: Open Heart Surgery;  Laterality: N/A;  . LAPAROSCOPY N/A 05/21/2016   Procedure: LAPAROSCOPY DIAGNOSTIC, LYSIS OF ADHESION BAND;  Surgeon: Axel Filler, MD;  Location: [facility] OR;  Service: General;  Laterality: N/A;  . LEFT AND RIGHT HEART CATHETERIZATION WITH CORONARY ANGIOGRAM N/A 05/22/2012   Procedure: LEFT AND RIGHT HEART CATHETERIZATION WITH CORONARY ANGIOGRAM;  Surgeon:  Kathleene Hazel, MD;  Location: [facility] CATH LAB;  Service: Cardiovascular;  Laterality: N/A;  . MINIMALLY INVASIVE MAZE PROCEDURE N/A 06/28/2012   Procedure: MINIMALLY INVASIVE MAZE PROCEDURE;  Surgeon: Purcell Nails, MD;  Location: [facility] OR;  Service: Open Heart Surgery;  Laterality: N/A;  . MITRAL VALVE REPAIR Right 06/28/2012   Procedure: MINIMALLY INVASIVE MITRAL VALVE REPAIR (MVR);  Surgeon: Purcell Nails, MD;  Location: [facility] OR;  Service: Open Heart Surgery;  Laterality: Right;  . OVARIAN CYST SURGERY  1988  . TEE WITHOUT CARDIOVERSION  05/19/2012   Procedure: TRANSESOPHAGEAL ECHOCARDIOGRAM (TEE);  Surgeon: Dolores Patty, MD;  Location: [facility] ENDOSCOPY;  Service: Cardiovascular;  Laterality: N/A;  . TUBAL LIGATION  1974  . URETHRAL STRICTURE DILATATION    . UTERINE FIBROID SURGERY  1988  . `       Current Outpatient Medications  Medication Sig Dispense Refill  . ALPRAZolam (XANAX) 0.25 MG tablet Take 0.25 mg by mouth as directed.    . carvedilol (COREG) 3.125 MG tablet Take 1 tablet (3.125 mg total) by mouth 2 (two) times daily with a meal. 60 tablet 3  . cholecalciferol (VITAMIN D) 1000 units tablet Take 1,000 Units by mouth daily.     . fluticasone (FLONASE) 50 MCG/ACT nasal spray Place 1 spray into both nostrils daily as needed for allergies.     . Menthol, Topical Analgesic, (ICY HOT EX) Apply 1 application topically daily as needed (back pain).    . methimazole (TAPAZOLE) 5 MG tablet Take 5 mg by mouth daily.     . pantoprazole (PROTONIX) 40 MG tablet Take 40 mg by mouth daily.     . Rivaroxaban (XARELTO) 15 MG TABS tablet Take 1 tablet (15 mg total) by mouth daily with supper. 30 tablet 6  . traMADol (ULTRAM) 50 MG tablet Take by mouth as directed.     No current facility-administered medications for this visit.     Allergies:   Amiodarone; Diltiazem; Macrobid [nitrofurantoin monohyd macro]; Aspirin; Naproxen sodium; and Sulfa drugs cross reactors   Social History:  The  patient  reports that she has never smoked. She has never used smokeless tobacco. She reports that she does not drink alcohol or use drugs.   Family History:  The patient's family history includes Colon cancer in her father.    ROS:  Please see the history of present illness.   Otherwise, review of systems is positive for weight loss, fatigue, palpitations, abdominal pain, constipation, anxiety joint swelling, muscle pain, dizziness, headaches.   All other systems are reviewed and negative.   PHYSICAL EXAM: VS:  BP (!) 150/72   Pulse 83   Ht 5' 6.5" (1.689 m)   Wt 102 lb (46.3 kg)   SpO2 98%   BMI 16.22 kg/m  , BMI Body mass index is 16.22 kg/m. GEN: Well nourished, well developed, in no acute distress  HEENT: normal  Neck: no JVD, carotid bruits, or masses Cardiac: RRR; no murmurs, rubs, or gallops,no edema  Respiratory:  clear to auscultation bilaterally, normal work of  breathing GI: soft, nontender, nondistended, + BS MS: no deformity or atrophy  Skin: warm and dry Neuro:  Strength and sensation are intact Psych: euthymic mood, full affect  EKG:  EKG is ordered today. Personal review of the ekg ordered shows sinus rhythm, PVCs  Recent Labs: 09/20/2017: BUN 12; Creatinine, Ser 0.84; Hemoglobin 13.4; Platelets 226; Potassium 4.6; Sodium 140; TSH 2.254    Lipid Panel     Component Value Date/Time   CHOL 226 (H) 07/16/2015 0557   TRIG 74 07/16/2015 0557   HDL 45 07/16/2015 0557   CHOLHDL 5.0 07/16/2015 0557   VLDL 15 07/16/2015 0557   LDLCALC 166 (H) 07/16/2015 0557     Wt Readings from Last 3 Encounters:  03/29/18 102 lb (46.3 kg)  09/29/17 105 lb (47.6 kg)  09/27/17 104 lb (47.2 kg)      Other studies Reviewed: Additional studies/ records that were reviewed today include: TTE 03/08/2018 Review of the above records today demonstrates:  Left ventricular cavity normal size.  Mild concentric LVH.  EF 40%. Severely dilated left atrium Mild aortic  regurgitation Mitral annuloplasty ring in place with severe calcification.  Mild to moderate mitral stenosis.  Mild mitral regurgitation.  ASSESSMENT AND PLAN:  1.  PVCs: Appears to be coming from the outflow tract, though she does have multiple morphologies.  I feel that antiarrhythmic drugs are likely her best option.  She has tried sotalol, but this is made her feel quite poorly even at low doses.  We Khai Arrona start her on mexiletine today.  2.  Chronic systolic heart failure due to nonischemic cardiomyopathy: Possibly related to her elevated PVC burden.  We Creola Krotz plan to start mexiletine.  3.  Paroxysmal atrial fibrillation: Status post maze and left atrial clipping at the time of mitral valve surgery.  She has had both atrial fibrillation and atypical atrial flutter.  Her left atrium is unfortunately severely dilated.  I feel that it would be very difficult to keep her in sinus rhythm.  That being said, she is stayed in sinus rhythm cardioversion.  This patients CHA2DS2-VASc Score and unadjusted Ischemic Stroke Rate (% per year) is equal to 3.2 % stroke rate/year from a score of 3  Above score calculated as 1 point each if present [CHF, HTN, DM, Vascular=MI/PAD/Aortic Plaque, Age if 65-74, or Female] Above score calculated as 2 points each if present [Age > 75, or Stroke/TIA/TE]  Case discussed with primary cardiology  Current medicines are reviewed at length with the patient today.   The patient does not have concerns regarding her medicines.  The following changes were made today: Start mexiletine  Labs/ tests ordered today include:  Orders Placed This Encounter  Procedures  . EKG 12-Lead   Disposition:   FU with Kenidi Elenbaas 3 months  Signed, Braylyn Eye Jorja Loa, MD  03/29/2018 3:52 PM     [facility] [address] [place] [address] [phone] (office) [phone] (fax)

## 2018-03-30 ENCOUNTER — Telehealth: Payer: Self-pay | Admitting: Cardiology

## 2018-03-30 MED ORDER — MEXILETINE HCL 250 MG PO CAPS: 250.0000 mg | ORAL_CAPSULE | Freq: Two times a day (BID) | ORAL | 3 refills | Status: DC

## 2018-03-30 NOTE — Telephone Encounter (Signed)
° ° ° °*  STAT* If patient is at the pharmacy, call can be transferred to refill team.   1. Which medications need to be refilled? (please list name of each medication and dose if known) Mexiletine 250 mg twice a day  2. Which pharmacy/location (including street and city if local pharmacy) is medication to be sent to? [facility], [address] and [address]  3. Do they need a 30 day or 90 day supply? 30

## 2018-03-30 NOTE — Telephone Encounter (Signed)
Pt was seen yesterday 03/29/18 and Dr. Elberta Fortis stated to start pt on mexiletine. This medication is not on pt's medication list. Please address

## 2018-03-30 NOTE — Addendum Note (Signed)
Addended by: Baird Lyons on: 03/30/2018 11:40 AM   Modules accepted: Orders

## 2018-03-30 NOTE — Telephone Encounter (Signed)
Informed pt that medication sent to pharmacy

## 2018-04-02 ENCOUNTER — Telehealth: Payer: Self-pay | Admitting: Cardiovascular Disease

## 2018-04-02 NOTE — Telephone Encounter (Signed)
Received a call from patient due to concern for side effect from mexiletine.  Pt reports a headache and dry mouth since starting the medication.  Also feels like bugs are crawling on her skin.  No difficulty breathing or swelling.  She plans to stop the medication because she cannot tolerate the side effects.  I advised her that her symptoms should improve in the next few hours.

## 2018-04-05 ENCOUNTER — Telehealth: Payer: Self-pay | Admitting: Cardiology

## 2018-04-05 NOTE — Telephone Encounter (Signed)
Pt made aware that Dr. Elberta Fortis is going to review her chart. She is agreeable to restarting the low dose Sotalol if he feels that the best option.

## 2018-04-05 NOTE — Telephone Encounter (Signed)
Forwarding to Dr. Elberta Fortis to review chart

## 2018-04-05 NOTE — Telephone Encounter (Signed)
New Message:  Patient calling about some medication that is having a reaction. She did call the Dr. On call please call patient.

## 2018-04-07 ENCOUNTER — Telehealth: Payer: Self-pay | Admitting: Cardiology

## 2018-04-07 MED ORDER — SOTALOL HCL 80 MG PO TABS: 40.0000 mg | ORAL_TABLET | Freq: Two times a day (BID) | ORAL | 3 refills | Status: DC

## 2018-04-07 NOTE — Telephone Encounter (Signed)
Pt is not experiencing any "bugs crawling on her feeling".  It is her SBP being around 160 today.  This is concerning her. She also c/o being sick/not feeling well.  Pt advised to restart low dose Sotalol 40 mg twice a day.  Pt agreeable to plan.  Pt will continue to monitor her BP and call if no improvement after taking her medications.

## 2018-04-07 NOTE — Telephone Encounter (Signed)
  Patient is calling back in regards to previous discussion of her reaction to a medication. She is scared and would like advice on what to do because she is still having a reaction.

## 2018-04-10 DIAGNOSIS — E0590 Thyrotoxicosis, unspecified without thyrotoxic crisis or storm: Secondary | ICD-10-CM | POA: Diagnosis not present

## 2018-04-10 DIAGNOSIS — M810 Age-related osteoporosis without current pathological fracture: Secondary | ICD-10-CM | POA: Diagnosis not present

## 2018-04-10 DIAGNOSIS — E7849 Other hyperlipidemia: Secondary | ICD-10-CM | POA: Diagnosis not present

## 2018-04-10 DIAGNOSIS — R82998 Other abnormal findings in urine: Secondary | ICD-10-CM | POA: Diagnosis not present

## 2018-04-10 DIAGNOSIS — I10 Essential (primary) hypertension: Secondary | ICD-10-CM | POA: Diagnosis not present

## 2018-04-17 DIAGNOSIS — R51 Headache: Secondary | ICD-10-CM | POA: Diagnosis not present

## 2018-04-17 DIAGNOSIS — M545 Low back pain: Secondary | ICD-10-CM | POA: Diagnosis not present

## 2018-04-17 DIAGNOSIS — E042 Nontoxic multinodular goiter: Secondary | ICD-10-CM | POA: Diagnosis not present

## 2018-04-17 DIAGNOSIS — I480 Paroxysmal atrial fibrillation: Secondary | ICD-10-CM | POA: Diagnosis not present

## 2018-04-17 DIAGNOSIS — I341 Nonrheumatic mitral (valve) prolapse: Secondary | ICD-10-CM | POA: Diagnosis not present

## 2018-04-17 DIAGNOSIS — E7849 Other hyperlipidemia: Secondary | ICD-10-CM | POA: Diagnosis not present

## 2018-04-17 DIAGNOSIS — E0590 Thyrotoxicosis, unspecified without thyrotoxic crisis or storm: Secondary | ICD-10-CM | POA: Diagnosis not present

## 2018-04-17 DIAGNOSIS — M1990 Unspecified osteoarthritis, unspecified site: Secondary | ICD-10-CM | POA: Diagnosis not present

## 2018-04-17 DIAGNOSIS — I5020 Unspecified systolic (congestive) heart failure: Secondary | ICD-10-CM | POA: Diagnosis not present

## 2018-04-17 DIAGNOSIS — I10 Essential (primary) hypertension: Secondary | ICD-10-CM | POA: Diagnosis not present

## 2018-04-17 DIAGNOSIS — Z0000 Encounter for general adult medical examination without abnormal findings: Secondary | ICD-10-CM | POA: Diagnosis not present

## 2018-04-17 DIAGNOSIS — M810 Age-related osteoporosis without current pathological fracture: Secondary | ICD-10-CM | POA: Diagnosis not present

## 2018-04-20 ENCOUNTER — Other Ambulatory Visit: Payer: Self-pay | Admitting: Cardiology

## 2018-04-20 NOTE — Telephone Encounter (Signed)
°*  STAT* If patient is at the pharmacy, call can be transferred to refill team.   1. Which medications need to be refilled? (please list name of each medication and dose if known) Xareto 15mg   2. Which pharmacy/location (including street and city if local pharmacy) is medication to be sent to? [facility] on [address] of [address] and [address]   3. Do they need a 30 day or 90 day supply? 90 day supply

## 2018-04-20 NOTE — Telephone Encounter (Signed)
Xarelto 15mg  refill request received; pt is 78 yrs old, wt-46.3kg, Crea-0.84 on 09/20/17, last seen by Dr. Elberta Fortis on 03/29/18, CrCl-41.57ml/min; will send in refill to requested pharmacy.

## 2018-05-09 ENCOUNTER — Other Ambulatory Visit: Payer: Self-pay

## 2018-05-09 ENCOUNTER — Emergency Department (HOSPITAL_COMMUNITY): Payer: PPO

## 2018-05-09 ENCOUNTER — Emergency Department (HOSPITAL_COMMUNITY)
Admission: EM | Admit: 2018-05-09 | Discharge: 2018-05-09 | Disposition: A | Discharge status: Home / Self Care | Payer: PPO | Attending: Emergency Medicine | Admitting: Emergency Medicine

## 2018-05-09 ENCOUNTER — Encounter (HOSPITAL_COMMUNITY): Payer: Self-pay | Admitting: Emergency Medicine

## 2018-05-09 DIAGNOSIS — R5383 Other fatigue: Secondary | ICD-10-CM | POA: Diagnosis not present

## 2018-05-09 DIAGNOSIS — R531 Weakness: Secondary | ICD-10-CM | POA: Diagnosis not present

## 2018-05-09 DIAGNOSIS — I110 Hypertensive heart disease with heart failure: Secondary | ICD-10-CM | POA: Diagnosis not present

## 2018-05-09 DIAGNOSIS — R0602 Shortness of breath: Secondary | ICD-10-CM | POA: Diagnosis not present

## 2018-05-09 DIAGNOSIS — Z79899 Other long term (current) drug therapy: Secondary | ICD-10-CM | POA: Diagnosis not present

## 2018-05-09 DIAGNOSIS — R079 Chest pain, unspecified: Secondary | ICD-10-CM | POA: Diagnosis not present

## 2018-05-09 DIAGNOSIS — I509 Heart failure, unspecified: Secondary | ICD-10-CM | POA: Insufficient documentation

## 2018-05-09 LAB — COMPREHENSIVE METABOLIC PANEL
ALBUMIN: 3.9 g/dL (ref 3.5–5.0)
ALT: 21 U/L (ref 0–44)
AST: 27 U/L (ref 15–41)
Alkaline Phosphatase: 58 U/L (ref 38–126)
Anion gap: 9 (ref 5–15)
BILIRUBIN TOTAL: 0.8 mg/dL (ref 0.3–1.2)
BUN: 12 mg/dL (ref 8–23)
CO2: 27 mmol/L (ref 22–32)
CREATININE: 0.7 mg/dL (ref 0.44–1.00)
Calcium: 10 mg/dL (ref 8.9–10.3)
Chloride: 104 mmol/L (ref 98–111)
GFR calc Af Amer: >60 mL/min (ref >60)
GFR calc non Af Amer: >60 mL/min (ref >60)
GLUCOSE: 91 mg/dL (ref 70–99)
Potassium: 4.1 mmol/L (ref 3.5–5.1)
Sodium: 140 mmol/L (ref 135–145)
TOTAL PROTEIN: 6.8 g/dL (ref 6.5–8.1)

## 2018-05-09 LAB — URINALYSIS, ROUTINE W REFLEX MICROSCOPIC
BACTERIA UA: NONE SEEN
Bilirubin Urine: NEGATIVE
GLUCOSE, UA: NEGATIVE mg/dL
Ketones, ur: NEGATIVE mg/dL
Leukocytes, UA: NEGATIVE
Nitrite: NEGATIVE
PH: 8 (ref 5.0–8.0)
PROTEIN: NEGATIVE mg/dL
SPECIFIC GRAVITY, URINE: 1.005 (ref 1.005–1.030)

## 2018-05-09 LAB — BRAIN NATRIURETIC PEPTIDE: B Natriuretic Peptide: 141 pg/mL — ABNORMAL HIGH (ref 0.0–100.0)

## 2018-05-09 LAB — T4, FREE: Free T4: 0.78 ng/dL — ABNORMAL LOW (ref 0.82–1.77)

## 2018-05-09 LAB — CBC WITH DIFFERENTIAL/PLATELET
Abs Immature Granulocytes: 0.01 10*3/uL (ref 0.00–0.07)
BASOS PCT: 1 %
Basophils Absolute: 0 10*3/uL (ref 0.0–0.1)
EOS PCT: 1 %
Eosinophils Absolute: 0 10*3/uL (ref 0.0–0.5)
HEMATOCRIT: 39.2 % (ref 36.0–46.0)
Hemoglobin: 12.4 g/dL (ref 12.0–15.0)
IMMATURE GRANULOCYTES: 0 %
LYMPHS ABS: 1.8 10*3/uL (ref 0.7–4.0)
Lymphocytes Relative: 30 %
MCH: 29.2 pg (ref 26.0–34.0)
MCHC: 31.6 g/dL (ref 30.0–36.0)
MCV: 92.5 fL (ref 80.0–100.0)
Monocytes Absolute: 0.5 10*3/uL (ref 0.1–1.0)
Monocytes Relative: 8 %
NEUTROS PCT: 60 %
NRBC: 0 % (ref 0.0–0.2)
Neutro Abs: 3.5 10*3/uL (ref 1.7–7.7)
Platelets: 202 10*3/uL (ref 150–400)
RBC: 4.24 MIL/uL (ref 3.87–5.11)
RDW: 13.2 % (ref 11.5–15.5)
WBC: 5.8 10*3/uL (ref 4.0–10.5)

## 2018-05-09 LAB — TSH: TSH: 2.231 u[IU]/mL (ref 0.350–4.500)

## 2018-05-09 LAB — I-STAT TROPONIN, ED: TROPONIN I, POC: 0 ng/mL (ref 0.00–0.08)

## 2018-05-09 NOTE — ED Triage Notes (Signed)
Pt brought in by EMS and pt has been feeling very weak last 3 days and EKG shows bigeminy. Dr. Theotis Burrow is pts cardiologist sent pt here. BP 142/80, HR 80, 100% room air. 20 LAC. AO x4.

## 2018-05-09 NOTE — ED Notes (Signed)
Patient verbalizes understanding of discharge instructions. Opportunity for questioning and answers were provided. Armband removed by staff, pt discharged from ED. Family to take pt home.

## 2018-05-09 NOTE — ED Notes (Signed)
(  [phone], [name] [name], please call with updates

## 2018-05-09 NOTE — ED Notes (Signed)
Pt moved to progressive bed. Son at bedside.

## 2018-05-09 NOTE — ED Notes (Signed)
ED Provider at bedside. 

## 2018-05-09 NOTE — ED Notes (Signed)
EDP at bedside  

## 2018-05-09 NOTE — ED Provider Notes (Signed)
[facility] EMERGENCY DEPARTMENT Provider Note   CSN: [id number] Arrival date & time: 05/09/18  1006     History   Chief Complaint Chief Complaint  Patient presents with  . Weakness    HPI [name] [name] is a 78 y.o. female.  HPI   78 y.o. female history of hyperlipidemia, hypertension, mitral valve repair and left atrial appendage clipping with maze in 2014, PVCs  When checking blood pressure and heart rate at home they have been low.  Reports heart rate wouldn't show would just saw it was low.  Severe fatigue, generalized weakness, can't do normal things, since Sunday, maybe even Saturday  Started sotalol 12/13, but these symptoms just started this week Told her to cut it in half Reports history of issues with medications  Hx of thyrotoxicosis from amiodarone and on methimazole  No fevers, no chest pain or dyspnea, no cough, dyruia, no vomiting, diarrhea, black or bloody stools, drinking prune juice for constipation, hard dark stool, ran out of miralax     Past Medical History:  Diagnosis Date  . Acute coronary syndrome (HCC) 06/2015  . 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 Anemia   . Anxiety   . Aortic stenosis 05/19/2012   trivial by TEE  . Atrial fibrillation (HCC)    a. Dx 04/2012;  b. Amio d/c'd 2/2 hyperthyroidism;  c. Atrial flutter 10/2012;  d. On xarelto.  [name]   . CHF (congestive heart failure) (HCC)   . Complication of anesthesia   . Coronary artery disease   . Diverticulitis   . GERD (gastroesophageal reflux disease)   . Goiter   . Headache   . Heart murmur   . Hyperlipidemia    recently- taken off Crestor- due to pain in her legs, but this was after cardiac cath. , pt. questioning whether the pain in her legs was related to cath. or crestor  . Hypertension    Then hypotension 04/2012 limiting med adjustment.  . Intractable vomiting   . Malnutrition of moderate degree (HCC)   . Microscopic hematuria 08/20/2012    . Mitral valve prolapse    a. H/o MVP, with severe MR 04/2012, had MV repair and Maze  . Pneumonia    treated /w antibiotic- [facility]- early Feb. 2014  . PONV (postoperative nausea and vomiting)   . Refusal of blood transfusions as patient is Jehovah's Witness   . S/P Maze operation for atrial fibrillation 06/28/2012   Complete bilateral atrial lesion set using cryothermy ablation with oversewing of LA appendage via right minithoracotomy  . S/P mitral valve repair 06/28/2012   Complex valvuloplasty including quadrangular resection of posterior leaflet, sliding leaflet plasty, artificial Gore-tex neocord placement x2 and 25mm Sorin Memo 3D ring annuloplasty via right mini thoracotomy   . SBO (small bowel obstruction) (HCC)   . Shortness of breath     Patient Active Problem List   Diagnosis Date Noted  . Malnutrition of moderate degree 05/17/2016  . Bigeminy   . Intractable vomiting 05/15/2016  . Heart palpitations 05/15/2016  . SBO (small bowel obstruction) (HCC) 05/15/2016  . Acute coronary syndrome (HCC) 07/15/2015  . Status post Maze operation for atrial fibrillation 01/08/2013  . S/P MVR (mitral valve repair) 01/08/2013  . Atrial flutter (HCC) 10/30/2012  . Amaurosis fugax 10/30/2012  . Dyspnea 10/28/2012  . 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 Thyroid goiter 08/21/2012  . Microscopic hematuria 08/20/2012  . S/P mitral valve repair 06/28/2012  . History of mitral  valve prolapse with severe mitral regurgtation 05/19/2012  . Refusal of blood transfusions as patient is Jehovah's Witness 05/19/2012  . Atrial fibrillation (HCC) 05/16/2012  . Anxiety 04/27/2011  . Hypertension 04/26/2011  . Diverticulitis 04/24/2011  . Hyperlipidemia     Past Surgical History:  Procedure Laterality Date  . CARDIAC CATHETERIZATION N/A 07/16/2015   Procedure: Left Heart Cath and Coronary Angiography;  Surgeon: Orpah CobbAjay Kadakia, MD;  Location: [facility] INVASIVE CV LAB;  Service: Cardiovascular;   Laterality: N/A;  . CORONARY ARTERY BYPASS GRAFT    . INTRAOPERATIVE TRANSESOPHAGEAL ECHOCARDIOGRAM N/A 06/28/2012   Procedure: INTRAOPERATIVE TRANSESOPHAGEAL ECHOCARDIOGRAM;  Surgeon: Purcell Nailslarence H Owen, MD;  [facility];  Service: Open Heart Surgery;  Laterality: N/A;  . LAPAROSCOPY N/A 05/21/2016   Procedure: LAPAROSCOPY DIAGNOSTIC, LYSIS OF ADHESION BAND;  Surgeon: Axel FillerArmando Ramirez, MD;  Location: [facility] OR;  Service: General;  Laterality: N/A;  . LEFT AND RIGHT HEART CATHETERIZATION WITH CORONARY ANGIOGRAM N/A 05/22/2012   Procedure: LEFT AND RIGHT HEART CATHETERIZATION WITH CORONARY ANGIOGRAM;  Surgeon: Kathleene Hazelhristopher D McAlhany, MD;  [facility];  Service: Cardiovascular;  Laterality: N/A;  . MINIMALLY INVASIVE MAZE PROCEDURE N/A 06/28/2012   Procedure: MINIMALLY INVASIVE MAZE PROCEDURE;  Surgeon: Purcell Nailslarence H Owen, MD;  Location: [facility] OR;  Service: Open Heart Surgery;  Laterality: N/A;  . MITRAL VALVE REPAIR Right 06/28/2012   Procedure: MINIMALLY INVASIVE MITRAL VALVE REPAIR (MVR);  Surgeon: Purcell Nailslarence H Owen, MD;  [facility];  Service: Open Heart Surgery;  Laterality: Right;  . OVARIAN CYST SURGERY  1988  . TEE WITHOUT CARDIOVERSION  05/19/2012   Procedure: TRANSESOPHAGEAL ECHOCARDIOGRAM (TEE);  Surgeon: Dolores Pattyaniel R Bensimhon, MD;  [facility];  Service: Cardiovascular;  Laterality: N/A;  . TUBAL LIGATION  1974  . URETHRAL STRICTURE DILATATION    . UTERINE FIBROID SURGERY  1988  . `       OB History   No obstetric history on file.      Home Medications    Prior to Admission medications   Medication Sig Start Date End Date Taking? Authorizing Provider  ALPRAZolam (XANAX) 0.25 MG tablet Take 0.25 mg by mouth as directed.    [provider]  carvedilol (COREG) 3.125 MG tablet Take 1 tablet (3.125 mg total) by mouth 2 (two) times daily with a meal. 07/18/15   Orpah CobbKadakia, Ajay, MD  cholecalciferol (VITAMIN D) 1000 units tablet Take 1,000 Units by mouth daily.     [provider]  fluticasone (FLONASE) 50 MCG/ACT nasal spray Place 1 spray into both nostrils daily as needed for allergies.  12/08/12   [provider]  Menthol, Topical Analgesic, (ICY HOT EX) Apply 1 application topically daily as needed (back pain).    [provider]  methimazole (TAPAZOLE) 5 MG tablet Take 5 mg by mouth daily.     [provider]  mexiletine (MEXITIL) 250 MG capsule Take 1 capsule (250 mg total) by mouth 2 (two) times daily. 03/30/18   Camnitz, Will Daphine DeutscherMartin, MD  pantoprazole (PROTONIX) 40 MG tablet Take 40 mg by mouth daily.  08/24/12   Geoffry ParadiseAronson, Richard, MD  sotalol (BETAPACE) 80 MG tablet Take 0.5 tablets (40 mg total) by mouth 2 (two) times daily. 04/07/18   Camnitz, Andree CossWill Martin, MD  traMADol (ULTRAM) 50 MG tablet Take by mouth as directed.    [provider]  XARELTO 15 MG TABS tablet TAKE 1 TABLET BY MOUTH DAILY WITH SUPPER 04/20/18   Camnitz, Andree CossWill Martin, MD  Family History Family History  Problem Relation Age of Onset  . Colon cancer Father     Social History Social History   Tobacco Use  . Smoking status: Never Smoker  . Smokeless tobacco: Never Used  Substance Use Topics  . Alcohol use: No    Comment: 1 drink a month  . Drug use: No     Allergies   Amiodarone; Diltiazem; Macrobid [nitrofurantoin monohyd macro]; Aspirin; Naproxen sodium; and Sulfa drugs cross reactors   Review of Systems Review of Systems  Constitutional: Positive for fatigue. Negative for fever.  HENT: Negative for sore throat.   Eyes: Negative for visual disturbance.  Respiratory: Negative for cough and shortness of breath.   Cardiovascular: Negative for chest pain and leg swelling.  Gastrointestinal: Positive for constipation. Negative for abdominal pain, blood in stool, diarrhea, nausea and vomiting.  Genitourinary: Negative for difficulty urinating.  Musculoskeletal: Negative for back pain and neck pain.  Skin: Negative for rash.    Neurological: Negative for syncope, light-headedness and headaches. Weakness: generally weak.     Physical Exam Updated Vital Signs BP (!) 153/72   Pulse 85   Temp 98 F (36.7 C) (Oral)   Resp 18   Ht 5' 6.5" (1.689 m)   Wt 45.4 kg   SpO2 100%   BMI 15.90 kg/m   Physical Exam Vitals signs and nursing note reviewed.  Constitutional:      General: She is not in acute distress.    Appearance: She is well-developed. She is not diaphoretic.  HENT:     Head: Normocephalic and atraumatic.  Eyes:     Conjunctiva/sclera: Conjunctivae normal.  Neck:     Musculoskeletal: Normal range of motion.  Cardiovascular:     Rate and Rhythm: Normal rate. Rhythm irregular.     Heart sounds: Normal heart sounds. No murmur. No friction rub. No gallop.   Pulmonary:     Effort: Pulmonary effort is normal. No respiratory distress.     Breath sounds: Normal breath sounds. No wheezing or rales.  Abdominal:     General: There is no distension.     Palpations: Abdomen is soft.     Tenderness: There is no abdominal tenderness. There is no guarding.  Musculoskeletal:        General: No tenderness.  Skin:    General: Skin is warm and dry.     Findings: No erythema or rash.  Neurological:     Mental Status: She is alert and oriented to person, place, and time.      ED Treatments / Results  Labs (all labs ordered are listed, but only abnormal results are displayed) Labs Reviewed  BRAIN NATRIURETIC PEPTIDE - Abnormal; Notable for the following components:      Result Value   B Natriuretic Peptide 141.0 (*)    All other components within normal limits  URINALYSIS, ROUTINE W REFLEX MICROSCOPIC - Abnormal; Notable for the following components:   Color, Urine STRAW (*)    Hgb urine dipstick MODERATE (*)    All other components within normal limits  T4, FREE - Abnormal; Notable for the following components:   Free T4 0.78 (*)    All other components within normal limits  URINE CULTURE  CBC  WITH DIFFERENTIAL/PLATELET  COMPREHENSIVE METABOLIC PANEL  TSH  I-STAT TROPONIN, ED    EKG EKG Interpretation  Date/Time:  Tuesday May 09 2018 10:24:10 EST Ventricular Rate:  84 PR Interval:    QRS Duration: 111 QT Interval:  399 QTC Calculation: 472 R Axis:   -28 Text Interpretation:  Sinus rhythm Multiple ventricular premature complexes Prolonged PR interval LVH with secondary repolarization abnormality Anterior infarct, old Baseline wander in lead(s) III No significant change since last tracing Confirmed by Alvira Monday ([id number]) on 05/09/2018 10:30:34 AM   Radiology Dg Chest 2 View  Result Date: 05/09/2018 CLINICAL DATA:  . weakness, midline lower chest pain today, some shortness of breath EXAM: CHEST - 2 VIEW COMPARISON:  CT chest of 12/05/2017 and chest x-ray of 09/20/2016 FINDINGS: The lungs appear clear but very hyperaerated. This may indicate a degree of emphysema. The ground-glass opacity questioned on CT the chest is not visible on chest x-ray. Mild cardiomegaly is stable. There is also significant deviation of the tracheal air shadow to the left of midline indicating large thyroid goiter. This finding is documented by comparison with the CT chest. No bony abnormality is seen. IMPRESSION: 1. Marked hyperaeration.  No definite active process. 2. Stable cardiomegaly with mitral valve replacement. 3. Stable thyroid goiter. Electronically Signed   By: Dwyane Dee M.D.   On: 05/09/2018 11:17    Procedures Procedures (including critical care time)  Medications Ordered in ED Medications - No data to display   Initial Impression / Assessment and Plan / ED Course  I have reviewed the triage vital signs and the nursing notes.  Pertinent labs & imaging results that were available during my care of the patient were reviewed by me and considered in my medical decision making (see chart for details).     78 y.o. female history of hyperlipidemia, hypertension, mitral valve  repair and left atrial appendage clipping with maze in 2014, PVCs, thyrotoxicosis-amiodarone inuced, thyroid mass, lacunar infarct, atrial fibrillation, recent medication changes with patient started again on sotalol 12/13 after failed trial on mexiletine, presents with concern for severe fatigue. EKG with sinus rhythm, PVCs, pt in and out of bigeminy on telemetry. UA without infection. Labs show no anemia no electrolyte abnormalities, no sign of CHF. TSH WNL, T4 borderline.  Discussed patient with her son.  Called Dr. Jacinto Halim given frequent PVCs, possible symptomatic PVCs and bigeminy and he will follow up with her in a few days.    Discussed with patient that I recommend follow up with Dr. Jacinto Halim as well as her PCP. Encouraged her to consider discussion of her depressive thoughts as well as fatigue with her PCP, consider assisted living.  Patient discharged in stable condition with understanding of reasons to return.   Final Clinical Impressions(s) / ED Diagnoses   Final diagnoses:  Other fatigue    ED Discharge Orders    None       Alvira Monday, MD 05/09/18 [id number]

## 2018-05-10 LAB — URINE CULTURE: Culture: NO GROWTH

## 2018-05-12 DIAGNOSIS — F329 Major depressive disorder, single episode, unspecified: Secondary | ICD-10-CM | POA: Diagnosis not present

## 2018-05-12 DIAGNOSIS — I480 Paroxysmal atrial fibrillation: Secondary | ICD-10-CM | POA: Diagnosis not present

## 2018-05-12 DIAGNOSIS — E0590 Thyrotoxicosis, unspecified without thyrotoxic crisis or storm: Secondary | ICD-10-CM | POA: Diagnosis not present

## 2018-05-12 DIAGNOSIS — Z681 Body mass index (BMI) 19 or less, adult: Secondary | ICD-10-CM | POA: Diagnosis not present

## 2018-05-17 DIAGNOSIS — I493 Ventricular premature depolarization: Secondary | ICD-10-CM | POA: Diagnosis not present

## 2018-05-17 DIAGNOSIS — I5042 Chronic combined systolic (congestive) and diastolic (congestive) heart failure: Secondary | ICD-10-CM | POA: Diagnosis not present

## 2018-05-17 DIAGNOSIS — I480 Paroxysmal atrial fibrillation: Secondary | ICD-10-CM | POA: Diagnosis not present

## 2018-05-17 DIAGNOSIS — I428 Other cardiomyopathies: Secondary | ICD-10-CM | POA: Diagnosis not present

## 2018-05-31 ENCOUNTER — Other Ambulatory Visit: Payer: Self-pay | Admitting: Cardiology

## 2018-05-31 DIAGNOSIS — R0602 Shortness of breath: Secondary | ICD-10-CM

## 2018-05-31 DIAGNOSIS — I5042 Chronic combined systolic (congestive) and diastolic (congestive) heart failure: Secondary | ICD-10-CM

## 2018-06-01 DIAGNOSIS — E0590 Thyrotoxicosis, unspecified without thyrotoxic crisis or storm: Secondary | ICD-10-CM | POA: Diagnosis not present

## 2018-06-01 DIAGNOSIS — N390 Urinary tract infection, site not specified: Secondary | ICD-10-CM | POA: Diagnosis not present

## 2018-06-01 DIAGNOSIS — R350 Frequency of micturition: Secondary | ICD-10-CM | POA: Diagnosis not present

## 2018-06-09 ENCOUNTER — Other Ambulatory Visit: Payer: Self-pay | Admitting: Cardiology

## 2018-06-09 ENCOUNTER — Telehealth: Payer: Self-pay

## 2018-06-10 DIAGNOSIS — J449 Chronic obstructive pulmonary disease, unspecified: Secondary | ICD-10-CM | POA: Diagnosis not present

## 2018-06-10 DIAGNOSIS — R0902 Hypoxemia: Secondary | ICD-10-CM | POA: Diagnosis not present

## 2018-06-14 DIAGNOSIS — F329 Major depressive disorder, single episode, unspecified: Secondary | ICD-10-CM | POA: Diagnosis not present

## 2018-06-14 DIAGNOSIS — I480 Paroxysmal atrial fibrillation: Secondary | ICD-10-CM | POA: Diagnosis not present

## 2018-06-14 DIAGNOSIS — E0590 Thyrotoxicosis, unspecified without thyrotoxic crisis or storm: Secondary | ICD-10-CM | POA: Diagnosis not present

## 2018-06-14 DIAGNOSIS — I10 Essential (primary) hypertension: Secondary | ICD-10-CM | POA: Diagnosis not present

## 2018-06-14 DIAGNOSIS — F419 Anxiety disorder, unspecified: Secondary | ICD-10-CM | POA: Diagnosis not present

## 2018-06-14 NOTE — Telephone Encounter (Signed)
Nocturnal oximetry 06/10/2018: Highest SpO2 98%, lowest 86%, but lasted less than 3 minutes.  Patient works a from 8 PM through 11:30 PM, total 3 hours and 10 minutes.  Does not qualify for O2 supplementation.  Please let her know that she does not qualify for  O2 at home as she does not appear to have worn the device for > 4 hours total.  Not much I can do for that.

## 2018-06-15 NOTE — Telephone Encounter (Signed)
Pt called stating she has been feeling fatigued. States she becomes tired quickly with simple tasks, and is experiencing shortness of breath.  Please Advise.    KC

## 2018-06-16 NOTE — Telephone Encounter (Signed)
Very complex and I have addressed this multiple times. Happy to see her. She probably needs EP study

## 2018-06-20 NOTE — Telephone Encounter (Signed)
Called Pt and she is still not feeling well.  Can you please clarify what an EP is? She also wants to know if you can see her sooner than 07/19/2018.  Please Advise.

## 2018-06-21 ENCOUNTER — Telehealth: Payer: Self-pay | Admitting: Cardiology

## 2018-06-21 NOTE — Telephone Encounter (Signed)
Called Pt and made her aware. Pt is allergic to amiodarone. Wants to know if there is an alternative.

## 2018-06-21 NOTE — Telephone Encounter (Signed)
I do not see Amiodarone in her  List. How does she know she is allergic?

## 2018-06-27 NOTE — Telephone Encounter (Signed)
Called Pt, waiting for callback.

## 2018-06-28 DIAGNOSIS — I480 Paroxysmal atrial fibrillation: Secondary | ICD-10-CM | POA: Diagnosis not present

## 2018-06-28 DIAGNOSIS — R0609 Other forms of dyspnea: Secondary | ICD-10-CM | POA: Diagnosis not present

## 2018-06-28 DIAGNOSIS — I5020 Unspecified systolic (congestive) heart failure: Secondary | ICD-10-CM | POA: Diagnosis not present

## 2018-06-28 DIAGNOSIS — I110 Hypertensive heart disease with heart failure: Secondary | ICD-10-CM | POA: Diagnosis not present

## 2018-06-28 DIAGNOSIS — Z681 Body mass index (BMI) 19 or less, adult: Secondary | ICD-10-CM | POA: Diagnosis not present

## 2018-06-30 ENCOUNTER — Ambulatory Visit (INDEPENDENT_AMBULATORY_CARE_PROVIDER_SITE_OTHER): Payer: PPO | Admitting: Cardiology

## 2018-06-30 ENCOUNTER — Encounter: Payer: Self-pay | Admitting: Cardiology

## 2018-06-30 ENCOUNTER — Other Ambulatory Visit: Payer: Self-pay | Admitting: Cardiology

## 2018-06-30 VITALS — BP 132/74 | HR 113 | Ht 66.0 in | Wt 102.2 lb

## 2018-06-30 DIAGNOSIS — I493 Ventricular premature depolarization: Secondary | ICD-10-CM

## 2018-06-30 DIAGNOSIS — Z79899 Other long term (current) drug therapy: Secondary | ICD-10-CM

## 2018-06-30 DIAGNOSIS — I484 Atypical atrial flutter: Secondary | ICD-10-CM | POA: Diagnosis not present

## 2018-06-30 MED ORDER — SOTALOL HCL 80 MG PO TABS: 80.0000 mg | ORAL_TABLET | Freq: Two times a day (BID) | ORAL | 3 refills | Status: DC

## 2018-06-30 NOTE — Telephone Encounter (Signed)
Received refill request for tramadol, is this ok to send?

## 2018-06-30 NOTE — H&P (View-Only) (Signed)
Electrophysiology Office Note   Date:  06/30/2018   ID:  [name] [name], DOB 05/20/1940, MRN [id number]  PCP:  Geoffry Paradise, MD  Cardiologist:  Jacinto Halim Primary Electrophysiologist:   Jorja Loa, MD    No chief complaint on file.    History of Present Illness: [name] [name] is a 78 y.o. female who is being seen today for the evaluation of PVCs at the request of Yates Decamp. Presenting today for electrophysiology evaluation.  She has a history of hyperlipidemia, hypertension, mitral valve repair and left atrial appendage clipping with maze in 2014.  Coronary angiogram May 2017 was normal.  She has a history of amiodarone induced thyroiditis.  She had an echo performed February 2019 with severe LV systolic dysfunction.  June 2017 showed no LV systolic dysfunction.  She was having 22% PVCs which were monomorphic.  She was put on Coreg which improved her symptoms.  Today, denies symptoms of palpitations, chest pain, shortness of breath, orthopnea, PND, lower extremity edema, claudication, dizziness, presyncope, syncope, bleeding, or neurologic sequela. The patient is tolerating medications without difficulties.  Continues to feel weak and fatigued.  She has had thyrotoxicosis from amiodarone, and was hospitalized from this.  She has not tolerated mexiletine, and is on sotalol without much in the way of symptoms.  Past Medical History:  Diagnosis Date  . Acute coronary syndrome (HCC) 06/2015  . 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 Anemia   . Anxiety   . Aortic stenosis 05/19/2012   trivial by TEE  . Atrial fibrillation (HCC)    a. Dx 04/2012;  b. Amio d/c'd 2/2 hyperthyroidism;  c. Atrial flutter 10/2012;  d. On xarelto.  [name]   . CHF (congestive heart failure) (HCC)   . Complication of anesthesia   . Coronary artery disease   . Diverticulitis   . GERD (gastroesophageal reflux disease)   . Goiter   . Headache   . Heart murmur   . Hyperlipidemia    recently- taken off Crestor- due to pain in her legs, but this was after cardiac cath. , pt. questioning whether the pain in her legs was related to cath. or crestor  . Hypertension    Then hypotension 04/2012 limiting med adjustment.  . Intractable vomiting   . Malnutrition of moderate degree (HCC)   . Microscopic hematuria 08/20/2012  . Mitral valve prolapse    a. H/o MVP, with severe MR 04/2012, had MV repair and Maze  . Pneumonia    treated /w antibiotic- [facility]- early Feb. 2014  . PONV (postoperative nausea and vomiting)   . Refusal of blood transfusions as patient is Jehovah's Witness   . S/P Maze operation for atrial fibrillation 06/28/2012   Complete bilateral atrial lesion set using cryothermy ablation with oversewing of LA appendage via right minithoracotomy  . S/P mitral valve repair 06/28/2012   Complex valvuloplasty including quadrangular resection of posterior leaflet, sliding leaflet plasty, artificial Gore-tex neocord placement x2 and 70mm Sorin Memo 3D ring annuloplasty via right mini thoracotomy   . SBO (small bowel obstruction) (HCC)   . Shortness of breath    Past Surgical History:  Procedure Laterality Date  . CARDIAC CATHETERIZATION N/A 07/16/2015   Procedure: Left Heart Cath and Coronary Angiography;  Surgeon: Orpah Cobb, MD;  [facility];  Service: Cardiovascular;  Laterality: N/A;  . CORONARY ARTERY BYPASS GRAFT    . INTRAOPERATIVE TRANSESOPHAGEAL ECHOCARDIOGRAM N/A 06/28/2012   Procedure: INTRAOPERATIVE TRANSESOPHAGEAL ECHOCARDIOGRAM;  Surgeon:  Purcell Nails, MD;  Location: [facility] OR;  Service: Open Heart Surgery;  Laterality: N/A;  . LAPAROSCOPY N/A 05/21/2016   Procedure: LAPAROSCOPY DIAGNOSTIC, LYSIS OF ADHESION BAND;  Surgeon: Axel Filler, MD;  Location: [facility] OR;  Service: General;  Laterality: N/A;  . LEFT AND RIGHT HEART CATHETERIZATION WITH CORONARY ANGIOGRAM N/A 05/22/2012   Procedure: LEFT AND RIGHT HEART CATHETERIZATION WITH CORONARY ANGIOGRAM;   Surgeon: Kathleene Hazel, MD;  Location: [facility] CATH LAB;  Service: Cardiovascular;  Laterality: N/A;  . MINIMALLY INVASIVE MAZE PROCEDURE N/A 06/28/2012   Procedure: MINIMALLY INVASIVE MAZE PROCEDURE;  Surgeon: Purcell Nails, MD;  [facility]: [facility] OR;  Service: Open Heart Surgery;  Laterality: N/A;  . MITRAL VALVE REPAIR Right 06/28/2012   Procedure: MINIMALLY INVASIVE MITRAL VALVE REPAIR (MVR);  Surgeon: Purcell Nails, MD;  Location: [facility] OR;  Service: Open Heart Surgery;  Laterality: Right;  . OVARIAN CYST SURGERY  1988  . TEE WITHOUT CARDIOVERSION  05/19/2012   Procedure: TRANSESOPHAGEAL ECHOCARDIOGRAM (TEE);  Surgeon: Dolores Patty, MD;  Location: [facility] ENDOSCOPY;  Service: Cardiovascular;  Laterality: N/A;  . TUBAL LIGATION  1974  . URETHRAL STRICTURE DILATATION    . UTERINE FIBROID SURGERY  1988  . `       Current Outpatient Medications  Medication Sig Dispense Refill  . ALPRAZolam (XANAX) 0.25 MG tablet Take 0.25 mg by mouth as directed.    . carvedilol (COREG) 3.125 MG tablet Take 1 tablet (3.125 mg total) by mouth 2 (two) times daily with a meal. 60 tablet 3  . carvedilol (COREG) 6.25 MG tablet TAKE 1 TABLET BY MOUTH TWICE DAILY 180 tablet 1  . cholecalciferol (VITAMIN D) 1000 units tablet Take 1,000 Units by mouth daily.     . fluticasone (FLONASE) 50 MCG/ACT nasal spray Place 1 spray into both nostrils daily as needed for allergies.     . Menthol, Topical Analgesic, (ICY HOT EX) Apply 1 application topically daily as needed (back pain).    . methimazole (TAPAZOLE) 5 MG tablet Take 5 mg by mouth daily.     [name] mexiletine (MEXITIL) 250 MG capsule Take 1 capsule (250 mg total) by mouth 2 (two) times daily. 60 capsule 3  . pantoprazole (PROTONIX) 40 MG tablet Take 40 mg by mouth daily.     . sotalol (BETAPACE) 80 MG tablet Take 0.5 tablets (40 mg total) by mouth 2 (two) times daily. 60 tablet 3  . traMADol (ULTRAM) 50 MG tablet Take by mouth as directed.    Carlena Hurl 15 MG TABS tablet  TAKE 1 TABLET BY MOUTH DAILY WITH SUPPER 90 tablet 1   No current facility-administered medications for this visit.     Allergies:   Amiodarone; Diltiazem; Macrobid [nitrofurantoin monohyd macro]; Aspirin; Naproxen sodium; and Sulfa drugs cross reactors   Social History:  The patient  reports that she has never smoked. She has never used smokeless tobacco. She reports that she does not drink alcohol or use drugs.   Family History:  The patient's family history includes Colon cancer in her father.    ROS:  Please see the history of present illness.   Otherwise, review of systems is positive for weight loss, sweating, fatigue, palpitations, abdominal pain, constipation.   All other systems are reviewed and negative.   PHYSICAL EXAM: VS:  BP 132/74   Pulse (!) 113   Ht 5\' 6"  (1.676 m)   Wt 102 lb 3.2 oz (46.4 kg)   SpO2 98%  BMI 16.50 kg/m  , BMI Body mass index is 16.5 kg/m. GEN: Well nourished, well developed, in no acute distress  HEENT: normal  Neck: no JVD, carotid bruits, or masses Cardiac: Tachycardic, irregular; no murmurs, rubs, or gallops,no edema  Respiratory:  clear to auscultation bilaterally, normal work of breathing GI: soft, nontender, nondistended, + BS MS: no deformity or atrophy  Skin: warm and dry Neuro:  Strength and sensation are intact Psych: euthymic mood, full affect  EKG:  EKG is ordered today. Personal review of the ekg ordered shows atrial flutter, rate 113, PVCs   Recent Labs: 05/09/2018: ALT 21; B Natriuretic Peptide 141.0; BUN 12; Creatinine, Ser 0.70; Hemoglobin 12.4; Platelets 202; Potassium 4.1; Sodium 140; TSH 2.231    Lipid Panel     Component Value Date/Time   CHOL 226 (H) 07/16/2015 0557   TRIG 74 07/16/2015 0557   HDL 45 07/16/2015 0557   CHOLHDL 5.0 07/16/2015 0557   VLDL 15 07/16/2015 0557   LDLCALC 166 (H) 07/16/2015 0557     Wt Readings from Last 3 Encounters:  06/30/18 102 lb 3.2 oz (46.4 kg)  05/09/18 100 lb (45.4 kg)    03/29/18 102 lb (46.3 kg)      Other studies Reviewed: Additional studies/ records that were reviewed today include: TTE 03/08/2018 Review of the above records today demonstrates:  Left ventricular cavity normal size.  Mild concentric LVH.  EF 40%. Severely dilated left atrium Mild aortic regurgitation Mitral annuloplasty ring in place with severe calcification.  Mild to moderate mitral stenosis.  Mild mitral regurgitation.  ASSESSMENT AND PLAN:  1.  PVCs: Unfortunately today, she has multiple morphologies of PVCs.  There is not one dominant morphology.  She certainly has an outflow tract morphology, but she also has a morphology with a right bundle branch pattern, potentially from the left ventricle.  She is certainly at high risk for ablation and thus I would prefer antiarrhythmic medications.  I  discuss with her primary physician if he feels that amiodarone would be appropriate since she is being currently treated for hyperthyroidism.  I  also discuss this with her primary cardiologist.    2.  Chronic systolic heart failure due to nonischemic cardiomyopathy: Possibly related to her PVC burden.  Planning to discuss with primary cardiology and primary physician.  3.  Paroxysmal atrial fibrillation: Unfortunately she is in atrial flutter today.  She is status post maze and left atrial appendage clipping at the time of mitral valve surgery.  She is currently on sotalol and Xarelto.  May start amiodarone in the future.  We  stop her carvedilol today and start her on sotalol. This patients CHA2DS2-VASc Score and unadjusted Ischemic Stroke Rate (% per year) is equal to 3.2 % stroke rate/year from a score of 3  Above score calculated as 1 point each if present [CHF, HTN, DM, Vascular=MI/PAD/Aortic Plaque, Age if 65-74, or Female] Above score calculated as 2 points each if present [Age > 75, or Stroke/TIA/TE]   Current medicines are reviewed at length with the patient today.   The  patient does not have concerns regarding her medicines.  The following changes were made today: Carvedilol, start sotalol  Labs/ tests ordered today include:  Orders Placed This Encounter  Procedures  . EKG 12-Lead   Disposition:   FU with   6 months  Signed,  Martin , MD  06/30/2018 3:20 PM     [facility] [address] [place] [address] (  [phone] (office) [phone] (fax)

## 2018-06-30 NOTE — Progress Notes (Signed)
Electrophysiology Office Note   Date:  06/30/2018   ID:  [name] [name], DOB 05/20/1940, MRN [id number]  PCP:  Geoffry Paradise, MD  Cardiologist:  Jacinto Halim Primary Electrophysiologist:  Collie Kittel Jorja Loa, MD    No chief complaint on file.    History of Present Illness: [name] [name] is a 78 y.o. female who is being seen today for the evaluation of PVCs at the request of Yates Decamp. Presenting today for electrophysiology evaluation.  She has a history of hyperlipidemia, hypertension, mitral valve repair and left atrial appendage clipping with maze in 2014.  Coronary angiogram May 2017 was normal.  She has a history of amiodarone induced thyroiditis.  She had an echo performed February 2019 with severe LV systolic dysfunction.  June 2017 showed no LV systolic dysfunction.  She was having 22% PVCs which were monomorphic.  She was put on Coreg which improved her symptoms.  Today, denies symptoms of palpitations, chest pain, shortness of breath, orthopnea, PND, lower extremity edema, claudication, dizziness, presyncope, syncope, bleeding, or neurologic sequela. The patient is tolerating medications without difficulties.  Continues to feel weak and fatigued.  She has had thyrotoxicosis from amiodarone, and was hospitalized from this.  She has not tolerated mexiletine, and is on sotalol without much in the way of symptoms.  Past Medical History:  Diagnosis Date  . Acute coronary syndrome (HCC) 06/2015  . 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 Anemia   . Anxiety   . Aortic stenosis 05/19/2012   trivial by TEE  . Atrial fibrillation (HCC)    a. Dx 04/2012;  b. Amio d/c'd 2/2 hyperthyroidism;  c. Atrial flutter 10/2012;  d. On xarelto.  [name]   . CHF (congestive heart failure) (HCC)   . Complication of anesthesia   . Coronary artery disease   . Diverticulitis   . GERD (gastroesophageal reflux disease)   . Goiter   . Headache   . Heart murmur   . Hyperlipidemia    recently- taken off Crestor- due to pain in her legs, but this was after cardiac cath. , pt. questioning whether the pain in her legs was related to cath. or crestor  . Hypertension    Then hypotension 04/2012 limiting med adjustment.  . Intractable vomiting   . Malnutrition of moderate degree (HCC)   . Microscopic hematuria 08/20/2012  . Mitral valve prolapse    a. H/o MVP, with severe MR 04/2012, had MV repair and Maze  . Pneumonia    treated /w antibiotic- [facility]- early Feb. 2014  . PONV (postoperative nausea and vomiting)   . Refusal of blood transfusions as patient is Jehovah's Witness   . S/P Maze operation for atrial fibrillation 06/28/2012   Complete bilateral atrial lesion set using cryothermy ablation with oversewing of LA appendage via right minithoracotomy  . S/P mitral valve repair 06/28/2012   Complex valvuloplasty including quadrangular resection of posterior leaflet, sliding leaflet plasty, artificial Gore-tex neocord placement x2 and 70mm Sorin Memo 3D ring annuloplasty via right mini thoracotomy   . SBO (small bowel obstruction) (HCC)   . Shortness of breath    Past Surgical History:  Procedure Laterality Date  . CARDIAC CATHETERIZATION N/A 07/16/2015   Procedure: Left Heart Cath and Coronary Angiography;  Surgeon: Orpah Cobb, MD;  [facility]: [facility]VAS[facility] CV LAB;  Service: Cardiovascular;  Laterality: N/A;  . CORONARY ARTERY BYPASS GRAFT    . INTRAOPERATIVE TRANSESOPHAGEAL ECHOCARDIOGRAM N/A 06/28/2012   Procedure: INTRAOPERATIVE TRANSESOPHAGEAL ECHOCARDIOGRAM;  Surgeon:  Purcell Nails, MD;  Location: [facility] OR;  Service: Open Heart Surgery;  Laterality: N/A;  . LAPAROSCOPY N/A 05/21/2016   Procedure: LAPAROSCOPY DIAGNOSTIC, LYSIS OF ADHESION BAND;  Surgeon: Axel Filler, MD;  Location: [facility] OR;  Service: General;  Laterality: N/A;  . LEFT AND RIGHT HEART CATHETERIZATION WITH CORONARY ANGIOGRAM N/A 05/22/2012   Procedure: LEFT AND RIGHT HEART CATHETERIZATION WITH CORONARY ANGIOGRAM;   Surgeon: Kathleene Hazel, MD;  Location: [facility] CATH LAB;  Service: Cardiovascular;  Laterality: N/A;  . MINIMALLY INVASIVE MAZE PROCEDURE N/A 06/28/2012   Procedure: MINIMALLY INVASIVE MAZE PROCEDURE;  Surgeon: Purcell Nails, MD;  Location: [facility] OR;  Service: Open Heart Surgery;  Laterality: N/A;  . MITRAL VALVE REPAIR Right 06/28/2012   Procedure: MINIMALLY INVASIVE MITRAL VALVE REPAIR (MVR);  Surgeon: Purcell Nails, MD;  Location: [facility] OR;  Service: Open Heart Surgery;  Laterality: Right;  . OVARIAN CYST SURGERY  1988  . TEE WITHOUT CARDIOVERSION  05/19/2012   Procedure: TRANSESOPHAGEAL ECHOCARDIOGRAM (TEE);  Surgeon: Dolores Patty, MD;  Location: [facility] ENDOSCOPY;  Service: Cardiovascular;  Laterality: N/A;  . TUBAL LIGATION  1974  . URETHRAL STRICTURE DILATATION    . UTERINE FIBROID SURGERY  1988  . `       Current Outpatient Medications  Medication Sig Dispense Refill  . ALPRAZolam (XANAX) 0.25 MG tablet Take 0.25 mg by mouth as directed.    . carvedilol (COREG) 3.125 MG tablet Take 1 tablet (3.125 mg total) by mouth 2 (two) times daily with a meal. 60 tablet 3  . carvedilol (COREG) 6.25 MG tablet TAKE 1 TABLET BY MOUTH TWICE DAILY 180 tablet 1  . cholecalciferol (VITAMIN D) 1000 units tablet Take 1,000 Units by mouth daily.     . fluticasone (FLONASE) 50 MCG/ACT nasal spray Place 1 spray into both nostrils daily as needed for allergies.     . Menthol, Topical Analgesic, (ICY HOT EX) Apply 1 application topically daily as needed (back pain).    . methimazole (TAPAZOLE) 5 MG tablet Take 5 mg by mouth daily.     [name] mexiletine (MEXITIL) 250 MG capsule Take 1 capsule (250 mg total) by mouth 2 (two) times daily. 60 capsule 3  . pantoprazole (PROTONIX) 40 MG tablet Take 40 mg by mouth daily.     . sotalol (BETAPACE) 80 MG tablet Take 0.5 tablets (40 mg total) by mouth 2 (two) times daily. 60 tablet 3  . traMADol (ULTRAM) 50 MG tablet Take by mouth as directed.    Carlena Hurl 15 MG TABS tablet  TAKE 1 TABLET BY MOUTH DAILY WITH SUPPER 90 tablet 1   No current facility-administered medications for this visit.     Allergies:   Amiodarone; Diltiazem; Macrobid [nitrofurantoin monohyd macro]; Aspirin; Naproxen sodium; and Sulfa drugs cross reactors   Social History:  The patient  reports that she has never smoked. She has never used smokeless tobacco. She reports that she does not drink alcohol or use drugs.   Family History:  The patient's family history includes Colon cancer in her father.    ROS:  Please see the history of present illness.   Otherwise, review of systems is positive for weight loss, sweating, fatigue, palpitations, abdominal pain, constipation.   All other systems are reviewed and negative.   PHYSICAL EXAM: VS:  BP 132/74   Pulse (!) 113   Ht 5\' 6"  (1.676 m)   Wt 102 lb 3.2 oz (46.4 kg)   SpO2 98%  BMI 16.50 kg/m  , BMI Body mass index is 16.5 kg/m. GEN: Well nourished, well developed, in no acute distress  HEENT: normal  Neck: no JVD, carotid bruits, or masses Cardiac: Tachycardic, irregular; no murmurs, rubs, or gallops,no edema  Respiratory:  clear to auscultation bilaterally, normal work of breathing GI: soft, nontender, nondistended, + BS MS: no deformity or atrophy  Skin: warm and dry Neuro:  Strength and sensation are intact Psych: euthymic mood, full affect  EKG:  EKG is ordered today. Personal review of the ekg ordered shows atrial flutter, rate 113, PVCs   Recent Labs: 05/09/2018: ALT 21; B Natriuretic Peptide 141.0; BUN 12; Creatinine, Ser 0.70; Hemoglobin 12.4; Platelets 202; Potassium 4.1; Sodium 140; TSH 2.231    Lipid Panel     Component Value Date/Time   CHOL 226 (H) 07/16/2015 0557   TRIG 74 07/16/2015 0557   HDL 45 07/16/2015 0557   CHOLHDL 5.0 07/16/2015 0557   VLDL 15 07/16/2015 0557   LDLCALC 166 (H) 07/16/2015 0557     Wt Readings from Last 3 Encounters:  06/30/18 102 lb 3.2 oz (46.4 kg)  05/09/18 100 lb (45.4 kg)    03/29/18 102 lb (46.3 kg)      Other studies Reviewed: Additional studies/ records that were reviewed today include: TTE 03/08/2018 Review of the above records today demonstrates:  Left ventricular cavity normal size.  Mild concentric LVH.  EF 40%. Severely dilated left atrium Mild aortic regurgitation Mitral annuloplasty ring in place with severe calcification.  Mild to moderate mitral stenosis.  Mild mitral regurgitation.  ASSESSMENT AND PLAN:  1.  PVCs: Unfortunately today, she has multiple morphologies of PVCs.  There is not one dominant morphology.  She certainly has an outflow tract morphology, but she also has a morphology with a right bundle branch pattern, potentially from the left ventricle.  She is certainly at high risk for ablation and thus I would prefer antiarrhythmic medications.  I Ossie Yebra discuss with her primary physician if he feels that amiodarone would be appropriate since she is being currently treated for hyperthyroidism.  I Tashan Kreitzer also discuss this with her primary cardiologist.    2.  Chronic systolic heart failure due to nonischemic cardiomyopathy: Possibly related to her PVC burden.  Planning to discuss with primary cardiology and primary physician.  3.  Paroxysmal atrial fibrillation: Unfortunately she is in atrial flutter today.  She is status post maze and left atrial appendage clipping at the time of mitral valve surgery.  She is currently on sotalol and Xarelto.  May start amiodarone in the future.  We Katriel Cutsforth stop her carvedilol today and start her on sotalol. This patients CHA2DS2-VASc Score and unadjusted Ischemic Stroke Rate (% per year) is equal to 3.2 % stroke rate/year from a score of 3  Above score calculated as 1 point each if present [CHF, HTN, DM, Vascular=MI/PAD/Aortic Plaque, Age if 65-74, or Female] Above score calculated as 2 points each if present [Age > 75, or Stroke/TIA/TE]   Current medicines are reviewed at length with the patient today.   The  patient does not have concerns regarding her medicines.  The following changes were made today: Carvedilol, start sotalol  Labs/ tests ordered today include:  Orders Placed This Encounter  Procedures  . EKG 12-Lead   Disposition:   FU with Nona Gracey 6 months  Signed, Vedh Ptacek Jorja Loa, MD  06/30/2018 3:20 PM     [facility] [address] [place] [address] (  [phone] (office) [phone] (fax)

## 2018-06-30 NOTE — Patient Instructions (Addendum)
Medication Instructions:  Your physician has recommended you make the following change in your medication:  1. STOP Carvedilol 2. RESTART Sotalol 80 mg twice a day  * If you need a refill on your cardiac medications before your next appointment, please call your pharmacy.   Labwork: None ordered  Testing/Procedures: None ordered  Follow-Up: Your physician recommends that you schedule a follow-up appointment on 07/03/2018 @ 11:00 am for nurse visit EKG.  Thank you for choosing [facility]!!   Dory Horn, RN [phone]

## 2018-07-02 ENCOUNTER — Telehealth: Payer: Self-pay | Admitting: Cardiology

## 2018-07-02 NOTE — Telephone Encounter (Signed)
Patient called stating that she is having a hard time adjusting to the Amio she was started on by Dr. Elberta Fortis for her PVCs.  I tried to contact patient but line is busy and after multiple attempts could not get call to go through.  Please call patient in am to address her concerns

## 2018-07-03 ENCOUNTER — Ambulatory Visit (INDEPENDENT_AMBULATORY_CARE_PROVIDER_SITE_OTHER): Payer: PPO | Admitting: Cardiology

## 2018-07-03 DIAGNOSIS — Z01812 Encounter for preprocedural laboratory examination: Secondary | ICD-10-CM | POA: Diagnosis not present

## 2018-07-03 DIAGNOSIS — I484 Atypical atrial flutter: Secondary | ICD-10-CM

## 2018-07-03 DIAGNOSIS — I493 Ventricular premature depolarization: Secondary | ICD-10-CM

## 2018-07-03 LAB — BASIC METABOLIC PANEL
BUN/Creatinine Ratio: 15 (ref 12–28)
BUN: 12 mg/dL (ref 8–27)
CO2: 22 mmol/L (ref 20–29)
CREATININE: 0.79 mg/dL (ref 0.57–1.00)
Calcium: 10.1 mg/dL (ref 8.7–10.3)
Chloride: 100 mmol/L (ref 96–106)
GFR calc Af Amer: 84 mL/min/{1.73_m2} (ref >59)
GFR calc non Af Amer: 73 mL/min/{1.73_m2} (ref >59)
GLUCOSE: 103 mg/dL — AB (ref 65–99)
Potassium: 4.6 mmol/L (ref 3.5–5.2)
SODIUM: 137 mmol/L (ref 134–144)

## 2018-07-03 LAB — CBC
Hematocrit: 35.7 % (ref 34.0–46.6)
Hemoglobin: 12.2 g/dL (ref 11.1–15.9)
MCH: 30.1 pg (ref 26.6–33.0)
MCHC: 34.2 g/dL (ref 31.5–35.7)
MCV: 88 fL (ref 79–97)
Platelets: 261 10*3/uL (ref 150–450)
RBC: 4.05 x10E6/uL (ref 3.77–5.28)
RDW: 12.8 % (ref 11.7–15.4)
WBC: 6.9 10*3/uL (ref 3.4–10.8)

## 2018-07-03 NOTE — Telephone Encounter (Signed)
Yes

## 2018-07-03 NOTE — Patient Instructions (Signed)
Medication Instructions:  Your physician recommends that you continue on your current medications as directed. Please refer to the Current Medication list given to you today.  * If you need a refill on your cardiac medications before your next appointment, please call your pharmacy.   Labwork: Labs today: BMET & CBC  *We will only notify you of abnormal results, otherwise continue current treatment plan.  Testing/Procedures: Your physician has recommended that you have a Cardioversion (DCCV). Electrical Cardioversion uses a jolt of electricity to your heart either through paddles or wired patches attached to your chest. This is a controlled, usually prescheduled, procedure. Defibrillation is done under light anesthesia in the hospital, and you usually go home the day of the procedure. This is done to get your heart back into a normal rhythm. You are not awake for the procedure. Please review the instructions below.  Follow-Up: Your physician recommends that you schedule a follow-up appointment in: 3 months with Dr. Elberta Fortis.  Thank you for choosing [facility]!!   Dory Horn, RN [phone]  Any Other Special Instructions Will Be Listed Below (If Applicable).  Cardioversion Instructions You are scheduled for a cardioversion on 07/04/18 with Dr. Elease Hashimoto or associates. Please go to [facility]  at 12:30 p.m..  Enter through the [employer] Do not have any food or drink after midnight the night before this procedure. You may take your medicines with a sip of water on the day of your procedure.  You will need someone to drive you home following your procedure.   Call the [facility] office at [phone] if you have any questions, problems or concerns.      Electrical Cardioversion Electrical cardioversion is the delivery of a jolt of electricity to change the rhythm of the heart. Sticky patches or metal paddles are placed on the chest to  deliver the electricity from a device. This is done to restore a normal rhythm. A rhythm that is too fast or not regular keeps the heart from pumping well. Electrical cardioversion is done in an emergency if:   There is low or no blood pressure as a result of the heart rhythm.    Normal rhythm must be restored as fast as possible to protect the brain and heart from further damage.    It may save a life. Cardioversion may be done for heart rhythms that are not immediately life threatening, such as atrial fibrillation or flutter, in which:   The heart is beating too fast or is not regular.    Medicine to change the rhythm has not worked.    It is safe to wait in order to allow time for preparation.  Symptoms of the abnormal rhythm are bothersome.  The risk of stroke and other serious problems can be reduced.  LET [facility] CARE PROVIDER KNOW ABOUT:   Any allergies you have.  All medicines you are taking, including vitamins, herbs, eye drops, creams, and over-the-counter medicines.  Previous problems you or members of your family have had with the use of anesthetics.    Any blood disorders you have.    Previous surgeries you have had.    Medical conditions you have.   RISKS AND COMPLICATIONS  Generally, this is a safe procedure. However, problems can occur and include:   Breathing problems related to the anesthetic used.  A blood clot that breaks free and travels to other parts of your body. This could cause a stroke or  other problems. The risk of this is lowered by use of blood-thinning medicine (anticoagulant) prior to the procedure.  Cardiac arrest (rare).   BEFORE THE PROCEDURE   You may have tests to detect blood clots in your heart and to evaluate heart function.   You may start taking anticoagulants so your blood does not clot as easily.    Medicines may be given to help stabilize your heart rate and rhythm.   PROCEDURE  You will be given medicine through  an IV tube to reduce discomfort and make you sleepy (sedative).    An electrical shock will be delivered.   AFTER THE PROCEDURE Your heart rhythm will be watched to make sure it does not change. You will need someone to drive you home.

## 2018-07-03 NOTE — Progress Notes (Signed)
1.  Reason for visit: EKG after restarting Sotalol  2.  Name of MD requesting visit:  Camnitz  3. H&P:  See epic  4.  ROS related to problem:  See epic  5.  Assessment and plan per MD:   EKG showing AFlutter.  Order to schedule DCCV and 11mo f/u appt per Dr. Elberta Fortis

## 2018-07-03 NOTE — Addendum Note (Signed)
Addended by: Tonita [place] on: 07/03/2018 11:44 AM   Modules accepted: Orders

## 2018-07-03 NOTE — Telephone Encounter (Signed)
Pt scheduled to be seen for nurse visit EKG at 11 today.  Will address complaint at that time.

## 2018-07-03 NOTE — Telephone Encounter (Signed)
See nurse visit for today for further information related to this

## 2018-07-04 ENCOUNTER — Ambulatory Visit (HOSPITAL_COMMUNITY): Payer: PPO | Admitting: Certified Registered Nurse Anesthetist

## 2018-07-04 ENCOUNTER — Ambulatory Visit (HOSPITAL_COMMUNITY)
Admission: RE | Admit: 2018-07-04 | Discharge: 2018-07-04 | Disposition: A | Discharge status: Home / Self Care | Payer: PPO | Attending: Cardiovascular Disease | Admitting: Cardiovascular Disease

## 2018-07-04 ENCOUNTER — Other Ambulatory Visit: Payer: Self-pay

## 2018-07-04 ENCOUNTER — Encounter (HOSPITAL_COMMUNITY)
Admission: RE | Disposition: A | Discharge status: Home / Self Care | Payer: Self-pay | Source: Home / Self Care | Attending: Cardiovascular Disease

## 2018-07-04 ENCOUNTER — Encounter (HOSPITAL_COMMUNITY): Payer: Self-pay | Admitting: *Deleted

## 2018-07-04 DIAGNOSIS — Z7901 Long term (current) use of anticoagulants: Secondary | ICD-10-CM | POA: Diagnosis not present

## 2018-07-04 DIAGNOSIS — I4892 Unspecified atrial flutter: Secondary | ICD-10-CM | POA: Insufficient documentation

## 2018-07-04 DIAGNOSIS — Z881 Allergy status to other antibiotic agents status: Secondary | ICD-10-CM | POA: Diagnosis not present

## 2018-07-04 DIAGNOSIS — I484 Atypical atrial flutter: Secondary | ICD-10-CM | POA: Diagnosis not present

## 2018-07-04 DIAGNOSIS — I428 Other cardiomyopathies: Secondary | ICD-10-CM | POA: Diagnosis not present

## 2018-07-04 DIAGNOSIS — I2510 Atherosclerotic heart disease of native coronary artery without angina pectoris: Secondary | ICD-10-CM | POA: Diagnosis not present

## 2018-07-04 DIAGNOSIS — E0590 Thyrotoxicosis, unspecified without thyrotoxic crisis or storm: Secondary | ICD-10-CM | POA: Insufficient documentation

## 2018-07-04 DIAGNOSIS — I5022 Chronic systolic (congestive) heart failure: Secondary | ICD-10-CM | POA: Diagnosis not present

## 2018-07-04 DIAGNOSIS — I341 Nonrheumatic mitral (valve) prolapse: Secondary | ICD-10-CM | POA: Insufficient documentation

## 2018-07-04 DIAGNOSIS — I493 Ventricular premature depolarization: Secondary | ICD-10-CM | POA: Diagnosis not present

## 2018-07-04 DIAGNOSIS — Z886 Allergy status to analgesic agent status: Secondary | ICD-10-CM | POA: Insufficient documentation

## 2018-07-04 DIAGNOSIS — Z79899 Other long term (current) drug therapy: Secondary | ICD-10-CM | POA: Diagnosis not present

## 2018-07-04 DIAGNOSIS — F419 Anxiety disorder, unspecified: Secondary | ICD-10-CM | POA: Insufficient documentation

## 2018-07-04 DIAGNOSIS — Z882 Allergy status to sulfonamides status: Secondary | ICD-10-CM | POA: Diagnosis not present

## 2018-07-04 DIAGNOSIS — E785 Hyperlipidemia, unspecified: Secondary | ICD-10-CM | POA: Insufficient documentation

## 2018-07-04 DIAGNOSIS — I509 Heart failure, unspecified: Secondary | ICD-10-CM | POA: Diagnosis not present

## 2018-07-04 DIAGNOSIS — K219 Gastro-esophageal reflux disease without esophagitis: Secondary | ICD-10-CM | POA: Insufficient documentation

## 2018-07-04 DIAGNOSIS — I110 Hypertensive heart disease with heart failure: Secondary | ICD-10-CM | POA: Insufficient documentation

## 2018-07-04 DIAGNOSIS — Z951 Presence of aortocoronary bypass graft: Secondary | ICD-10-CM | POA: Insufficient documentation

## 2018-07-04 DIAGNOSIS — I480 Paroxysmal atrial fibrillation: Secondary | ICD-10-CM | POA: Diagnosis not present

## 2018-07-04 DIAGNOSIS — E119 Type 2 diabetes mellitus without complications: Secondary | ICD-10-CM | POA: Diagnosis not present

## 2018-07-04 DIAGNOSIS — I4891 Unspecified atrial fibrillation: Secondary | ICD-10-CM | POA: Diagnosis not present

## 2018-07-04 HISTORY — PX: CARDIOVERSION: SHX1299

## 2018-07-04 SURGERY — CARDIOVERSION
Anesthesia: General

## 2018-07-04 MED ORDER — LIDOCAINE 2% (20 MG/ML) 5 ML SYRINGE
INTRAMUSCULAR | Status: DC | PRN
  Administered 2018-07-04: 20 mg via INTRAVENOUS

## 2018-07-04 MED ORDER — PROPOFOL 10 MG/ML IV BOLUS
INTRAVENOUS | Status: DC | PRN
  Administered 2018-07-04: 30 mg via INTRAVENOUS

## 2018-07-04 MED ORDER — SODIUM CHLORIDE 0.9 % IV SOLN
INTRAVENOUS | Status: DC | PRN
  Administered 2018-07-04: 14:00:00 via INTRAVENOUS

## 2018-07-04 NOTE — Addendum Note (Signed)
Addended by: Elmarie Shiley on: 07/04/2018 07:53 AM   Modules accepted: Orders

## 2018-07-04 NOTE — Discharge Instructions (Signed)
Electrical Cardioversion, Care After °This sheet gives you information about how to care for yourself after your procedure. Your health care provider may also give you more specific instructions. If you have problems or questions, contact your health care provider. °What can I expect after the procedure? °After the procedure, it is common to have: °· Some redness on the skin where the shocks were given. °Follow these instructions at home: ° °· Do not drive for 24 hours if you were given a medicine to help you relax (sedative). °· Take over-the-counter and prescription medicines only as told by your health care provider. °· Ask your health care provider how to check your pulse. Check it often. °· Rest for 48 hours after the procedure or as told by your health care provider. °· Avoid or limit your caffeine use as told by your health care provider. °Contact a health care provider if: °· You feel like your heart is beating too quickly or your pulse is not regular. °· You have a serious muscle cramp that does not go away. °Get help right away if: ° °· You have discomfort in your chest. °· You are dizzy or you feel faint. °· You have trouble breathing or you are short of breath. °· Your speech is slurred. °· You have trouble moving an arm or leg on one side of your body. °· Your fingers or toes turn cold or blue. °This information is not intended to replace advice given to you by your health care provider. Make sure you discuss any questions you have with your health care provider. °Document Released: 01/31/2013 Document Revised: 11/14/2015 Document Reviewed: 10/17/2015 °Elsevier Interactive Patient Education © 2019 [employer] ° °

## 2018-07-04 NOTE — Anesthesia Postprocedure Evaluation (Signed)
Anesthesia Post Note  Patient: [name] [name]  Procedure(s) Performed: CARDIOVERSION (N/A )     Patient location during evaluation: PACU Anesthesia Type: General Level of consciousness: awake and alert Pain management: pain level controlled Vital Signs Assessment: post-procedure vital signs reviewed and stable Respiratory status: spontaneous breathing, nonlabored ventilation, respiratory function stable and patient connected to nasal cannula oxygen Cardiovascular status: blood pressure returned to baseline and stable Postop Assessment: no apparent nausea or vomiting Anesthetic complications: no    Last Vitals:  Vitals:   07/04/18 1359 07/04/18 1405  BP: (!) 92/36 (!) 106/38  Pulse: (!) 56 (!) 59  Resp: 16 (!) 25  Temp: 36.6 C   SpO2: 98% 99%    Last Pain:  Vitals:   07/04/18 1359  TempSrc: Oral  PainSc: 0-No pain                 Kennieth Rad

## 2018-07-04 NOTE — Anesthesia Procedure Notes (Signed)
Procedure Name: General with mask airway Date/Time: 07/04/2018 1:48 PM Performed by: Julian Reil, CRNA Pre-anesthesia Checklist: Patient identified, Emergency Drugs available and Suction available Patient Re-evaluated:Patient Re-evaluated prior to induction Oxygen Delivery Method: Ambu bag Preoxygenation: Pre-oxygenation with 100% oxygen Induction Type: IV induction

## 2018-07-04 NOTE — Interval H&P Note (Signed)
History and Physical Interval Note:  07/04/2018 1:19 PM  [name] [name]  has presented today for surgery, with the diagnosis of AFLUTTER/AFIB.  The various methods of treatment have been discussed with the patient and family. After consideration of risks, benefits and other options for treatment, the patient has consented to  Procedure(s): CARDIOVERSION (N/A) as a surgical intervention.  The patient's history has been reviewed, patient examined, no change in status, stable for surgery.  I have reviewed the patient's chart and labs.  Questions were answered to the patient's satisfaction.     Kristeen Miss

## 2018-07-04 NOTE — Addendum Note (Signed)
Addendum  created 07/04/18 1451 by Julian Reil, CRNA   Intraprocedure Flowsheets edited

## 2018-07-04 NOTE — CV Procedure (Signed)
    Cardioversion Note  [name] [name] [id number] 05-07-1940     Procedure: DC Cardioversion Indications: atrial flutter   Procedure Details Consent: Obtained Time Out: Verified patient identification, verified procedure, site/side was marked, verified correct patient position, special equipment/implants available, Radiology Safety Procedures followed,  medications/allergies/relevent history reviewed, required imaging and test results available.  Performed  The patient has been on adequate anticoagulation.  The patient received IV Lidocaine 20 mg IV followed by Propofol 30 mg IV   for sedation.  Synchronous cardioversion was performed at 50  joules.  The cardioversion was successful     Complications: No apparent complications Patient did tolerate procedure well.   [name] [name], [name] [name]., MD, [facility] 07/04/2018, 2:03 PM

## 2018-07-04 NOTE — Transfer of Care (Signed)
Immediate Anesthesia Transfer of Care Note  Patient: [name] [name]  Procedure(s) Performed: CARDIOVERSION (N/A )  Patient Location: Endoscopy Unit  Anesthesia Type:General  Level of Consciousness: awake  Airway & Oxygen Therapy: Patient Spontanous Breathing and Patient connected to face mask oxygen  Post-op Assessment: Report given to RN and Post -op Vital signs reviewed and stable  Post vital signs: Reviewed and stable  Last Vitals:  Vitals Value Taken Time  BP    Temp    Pulse    Resp    SpO2      Last Pain:  Vitals:   07/04/18 1241  TempSrc: Oral  PainSc: 3       Patients Stated Pain Goal: 2 (07/04/18 1241)  Complications: No apparent anesthesia complications

## 2018-07-04 NOTE — Anesthesia Preprocedure Evaluation (Addendum)
Anesthesia Evaluation  Patient identified by MRN, date of birth, ID band Patient awake    Reviewed: Allergy & Precautions, NPO status , Patient's Chart, lab work & pertinent test results, reviewed documented beta blocker date and time   Airway Mallampati: II  TM Distance: >3 FB Neck ROM: Full    Dental  (+) Dental Advisory Given   Pulmonary shortness of breath,    breath sounds clear to auscultation       Cardiovascular hypertension, Pt. on medications and Pt. on home beta blockers + CAD and +CHF  + dysrhythmias  Rhythm:Irregular Rate:Normal     Neuro/Psych  Headaches,    GI/Hepatic Neg liver ROS, GERD  ,  Endo/Other  Hyperthyroidism   Renal/GU negative Renal ROS     Musculoskeletal   Abdominal   Peds  Hematology   Anesthesia Other Findings   Reproductive/Obstetrics                             Anesthesia Physical Anesthesia Plan  ASA: III  Anesthesia Plan: General   Post-op Pain Management:    Induction: Intravenous  PONV Risk Score and Plan: Treatment may vary due to age or medical condition  Airway Management Planned: Natural Airway and Mask  Additional Equipment:   Intra-op Plan:   Post-operative Plan:   Informed Consent: I have reviewed the patients History and Physical, chart, labs and discussed the procedure including the risks, benefits and alternatives for the proposed anesthesia with the patient or authorized representative who has indicated his/her understanding and acceptance.       Plan Discussed with: CRNA  Anesthesia Plan Comments:         Anesthesia Quick Evaluation

## 2018-07-05 ENCOUNTER — Encounter (HOSPITAL_COMMUNITY): Payer: Self-pay | Admitting: Cardiovascular Disease

## 2018-07-05 ENCOUNTER — Telehealth: Payer: Self-pay | Admitting: Cardiology

## 2018-07-05 NOTE — Telephone Encounter (Signed)
Called patient back. Patient reported her most recent BP 122/46 and HR 60. Informed patient that this is fine. Patient sounds anxious. Encouraged patient to stop taking her BP so frequently, and to take it easy today. Informed patient that if her SBP is lower than 90's to give our office a call, or if SBP consisently over 140. Patient agreed to plan and will call if she has any other concerns.

## 2018-07-05 NOTE — Telephone Encounter (Signed)
° °  Patient had cardioversion on 3/10. States she feels weak Patient calling to report HR and BP BP 130/57   STAT if HR is under 50 or over 120 (normal HR is 60-100 beats per minute)  1) What is your heart rate? 56  2) Do you have a log of your heart rate readings (document readings)? n/a  3) Do you have any other symptoms? "feels really cold"

## 2018-07-09 ENCOUNTER — Other Ambulatory Visit: Payer: Self-pay

## 2018-07-09 ENCOUNTER — Emergency Department (HOSPITAL_COMMUNITY)
Admission: EM | Admit: 2018-07-09 | Discharge: 2018-07-09 | Disposition: A | Discharge status: Home / Self Care | Payer: PPO | Attending: Emergency Medicine | Admitting: Emergency Medicine

## 2018-07-09 ENCOUNTER — Emergency Department (HOSPITAL_COMMUNITY): Payer: PPO

## 2018-07-09 DIAGNOSIS — R10817 Generalized abdominal tenderness: Secondary | ICD-10-CM | POA: Diagnosis not present

## 2018-07-09 DIAGNOSIS — Z7901 Long term (current) use of anticoagulants: Secondary | ICD-10-CM | POA: Insufficient documentation

## 2018-07-09 DIAGNOSIS — I110 Hypertensive heart disease with heart failure: Secondary | ICD-10-CM | POA: Diagnosis not present

## 2018-07-09 DIAGNOSIS — I499 Cardiac arrhythmia, unspecified: Secondary | ICD-10-CM | POA: Diagnosis not present

## 2018-07-09 DIAGNOSIS — R1084 Generalized abdominal pain: Secondary | ICD-10-CM | POA: Diagnosis not present

## 2018-07-09 DIAGNOSIS — I509 Heart failure, unspecified: Secondary | ICD-10-CM | POA: Insufficient documentation

## 2018-07-09 DIAGNOSIS — Z951 Presence of aortocoronary bypass graft: Secondary | ICD-10-CM | POA: Insufficient documentation

## 2018-07-09 DIAGNOSIS — I2510 Atherosclerotic heart disease of native coronary artery without angina pectoris: Secondary | ICD-10-CM | POA: Insufficient documentation

## 2018-07-09 DIAGNOSIS — I491 Atrial premature depolarization: Secondary | ICD-10-CM | POA: Diagnosis not present

## 2018-07-09 DIAGNOSIS — K529 Noninfective gastroenteritis and colitis, unspecified: Secondary | ICD-10-CM | POA: Insufficient documentation

## 2018-07-09 DIAGNOSIS — R1032 Left lower quadrant pain: Secondary | ICD-10-CM | POA: Diagnosis not present

## 2018-07-09 DIAGNOSIS — K6389 Other specified diseases of intestine: Secondary | ICD-10-CM | POA: Diagnosis not present

## 2018-07-09 DIAGNOSIS — R1030 Lower abdominal pain, unspecified: Secondary | ICD-10-CM | POA: Diagnosis not present

## 2018-07-09 LAB — CBC WITH DIFFERENTIAL/PLATELET
Abs Immature Granulocytes: 0.04 10*3/uL (ref 0.00–0.07)
Basophils Absolute: 0 10*3/uL (ref 0.0–0.1)
Basophils Relative: 0 %
Eosinophils Absolute: 0.1 10*3/uL (ref 0.0–0.5)
Eosinophils Relative: 0 %
HCT: 44.4 % (ref 36.0–46.0)
Hemoglobin: 14.3 g/dL (ref 12.0–15.0)
Immature Granulocytes: 0 %
Lymphocytes Relative: 15 %
Lymphs Abs: 2 10*3/uL (ref 0.7–4.0)
MCH: 30 pg (ref 26.0–34.0)
MCHC: 32.2 g/dL (ref 30.0–36.0)
MCV: 93.1 fL (ref 80.0–100.0)
Monocytes Absolute: 0.8 10*3/uL (ref 0.1–1.0)
Monocytes Relative: 6 %
Neutro Abs: 10.5 10*3/uL — ABNORMAL HIGH (ref 1.7–7.7)
Neutrophils Relative %: 79 %
PLATELETS: 217 10*3/uL (ref 150–400)
RBC: 4.77 MIL/uL (ref 3.87–5.11)
RDW: 13.3 % (ref 11.5–15.5)
WBC: 13.4 10*3/uL — ABNORMAL HIGH (ref 4.0–10.5)
nRBC: 0 % (ref 0.0–0.2)

## 2018-07-09 LAB — URINALYSIS, ROUTINE W REFLEX MICROSCOPIC
Bacteria, UA: NONE SEEN
Bilirubin Urine: NEGATIVE
Glucose, UA: NEGATIVE mg/dL
Ketones, ur: 5 mg/dL — AB
Leukocytes,Ua: NEGATIVE
Nitrite: NEGATIVE
Protein, ur: NEGATIVE mg/dL
Specific Gravity, Urine: 1.026 (ref 1.005–1.030)
pH: 6 (ref 5.0–8.0)

## 2018-07-09 LAB — COMPREHENSIVE METABOLIC PANEL
ALT: 49 U/L — AB (ref 0–44)
AST: 60 U/L — ABNORMAL HIGH (ref 15–41)
Albumin: 4.3 g/dL (ref 3.5–5.0)
Alkaline Phosphatase: 113 U/L (ref 38–126)
Anion gap: 8 (ref 5–15)
BUN: 10 mg/dL (ref 8–23)
CO2: 24 mmol/L (ref 22–32)
Calcium: 10.2 mg/dL (ref 8.9–10.3)
Chloride: 103 mmol/L (ref 98–111)
Creatinine, Ser: 0.75 mg/dL (ref 0.44–1.00)
GFR calc non Af Amer: >60 mL/min (ref >60)
Glucose, Bld: 99 mg/dL (ref 70–99)
Potassium: 4.5 mmol/L (ref 3.5–5.1)
Sodium: 135 mmol/L (ref 135–145)
Total Bilirubin: 1 mg/dL (ref 0.3–1.2)
Total Protein: 7.2 g/dL (ref 6.5–8.1)

## 2018-07-09 LAB — LACTIC ACID, PLASMA: Lactic Acid, Venous: 1.3 mmol/L (ref 0.5–1.9)

## 2018-07-09 LAB — LIPASE, BLOOD: Lipase: 39 U/L (ref 11–51)

## 2018-07-09 MED ORDER — ONDANSETRON HCL 4 MG/2ML IJ SOLN
4.0000 mg | Freq: Once | INTRAMUSCULAR | Status: AC
  Administered 2018-07-09: 4 mg via INTRAVENOUS
  Filled 2018-07-09: qty 2

## 2018-07-09 MED ORDER — SODIUM CHLORIDE 0.9 % IV SOLN
INTRAVENOUS | Status: DC
  Administered 2018-07-09: 11:00:00 via INTRAVENOUS

## 2018-07-09 MED ORDER — TRAMADOL HCL 50 MG PO TABS
50.0000 mg | ORAL_TABLET | Freq: Once | ORAL | Status: AC
  Administered 2018-07-09: 50 mg via ORAL
  Filled 2018-07-09: qty 1

## 2018-07-09 MED ORDER — IOHEXOL 300 MG/ML  SOLN
100.0000 mL | Freq: Once | INTRAMUSCULAR | Status: AC | PRN
  Administered 2018-07-09: 100 mL via INTRAVENOUS

## 2018-07-09 MED ORDER — MORPHINE SULFATE (PF) 4 MG/ML IV SOLN
4.0000 mg | Freq: Once | INTRAVENOUS | Status: AC
  Administered 2018-07-09: 4 mg via INTRAVENOUS
  Filled 2018-07-09: qty 1

## 2018-07-09 NOTE — ED Triage Notes (Signed)
Pt BIB [facility] for abdominal pain that started this morning. Pt reports that she has also had increased weakness. Per EMS pt had a cardioversion done on Tuesday. Pt reports the pain is in her lower abdomen and it started this morning after she started eating breakfast. Pt does report some nausea but denies any vomiting.

## 2018-07-09 NOTE — Discharge Instructions (Signed)
Start miralax to help with constipation.  Use 1 scoop in 8oz of water and if not BM in 6 hours repeat.  Use the nausea medication as needed.  Also stick with a bland diet of applesauce, chicken broth, toast or plain eggs.

## 2018-07-09 NOTE — ED Provider Notes (Signed)
[facility] EMERGENCY DEPARTMENT Provider Note   CSN: [id number] Arrival date & time: 07/09/18  1042    History   Chief Complaint Chief Complaint  Patient presents with   Abdominal Pain    HPI [name] [name] is a 78 y.o. female.     Patient is a 78 year old female with a history of recurrent atrial fibrillation who is on Xarelto and had cardioversion done 5 days ago, prior small bowel obstruction, amiodarone induced thyrotoxicosis and prior history of pelvic surgeries presenting today with sudden onset of abdominal pain about an hour and a half ago.  Patient states that yesterday she had some mild pain but it did not seem to affect her much.  She had had normal bowel movements.  She woke up this morning and after taking 2 bites of breakfast developed 10 out of 10 severe abdominal pain in the left lower quadrant.  It made her nauseated but she did not throw up.  She took a tramadol after the pain started but states it is not helped.  She denies any urinary symptoms.  She did go to the bathroom after the pain started and passed a few small balls of hard stool.  She states however that did not make the pain better.  No fever, chest pain or shortness of breath.  She has been taking her Xarelto as prescribed last took her dose last night before bed and has not missed any doses.  The history is provided by the patient.    Past Medical History:  Diagnosis Date   Acute coronary syndrome (HCC) 06/2015   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 Dx 04/2012;  b. Amio d/c'd 2/2 hyperthyroidism;  c. Atrial flutter 10/2012;  d. On xarelto.   Bigeminy    CHF (congestive heart failure) (HCC)    Complication of anesthesia    Coronary artery disease    Diverticulitis    GERD (gastroesophageal reflux disease)    Goiter    Headache    Heart murmur     Hyperlipidemia    recently- taken off Crestor- due to pain in her legs, but this was after cardiac cath. , pt. questioning whether the pain in her legs was related to cath. or crestor   Hypertension    Then hypotension 04/2012 limiting med adjustment.   Intractable vomiting    Malnutrition of moderate degree (HCC)    Microscopic hematuria 08/20/2012   Mitral valve prolapse    a. H/o MVP, with severe MR 04/2012, had MV repair and Maze   Pneumonia    treated /w antibiotic- [facility]- early Feb. 2014   PONV (postoperative nausea and vomiting)    Refusal of blood transfusions as patient is Jehovah's Witness    S/P Maze operation for atrial fibrillation 06/28/2012   Complete bilateral atrial lesion set using cryothermy ablation with oversewing of LA appendage via right minithoracotomy   S/P mitral valve repair 06/28/2012   Complex valvuloplasty including quadrangular resection of posterior leaflet, sliding leaflet plasty, artificial Gore-tex neocord placement x2 and 22mm Sorin Memo 3D ring annuloplasty via right mini thoracotomy    SBO (small bowel obstruction) (HCC)    Shortness of breath     Patient Active Problem List   Diagnosis Date Noted   Malnutrition of moderate degree 05/17/2016   Bigeminy    Intractable vomiting 05/15/2016  Heart palpitations 05/15/2016   SBO (small bowel obstruction) (HCC) 05/15/2016   Acute coronary syndrome (HCC) 07/15/2015   Status post Maze operation for atrial fibrillation 01/08/2013   S/P MVR (mitral valve repair) 01/08/2013   Atrial flutter (HCC) 10/30/2012   Amaurosis fugax 10/30/2012   Dyspnea 10/28/2012   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   Thyroid goiter 08/21/2012   Microscopic hematuria 08/20/2012   S/P mitral valve repair 06/28/2012   History of mitral valve prolapse with severe mitral regurgtation 05/19/2012   Refusal of blood transfusions as patient is Jehovah's Witness 05/19/2012   Atrial fibrillation (HCC)  05/16/2012   Anxiety 04/27/2011   Hypertension 04/26/2011   Diverticulitis 04/24/2011   Hyperlipidemia     Past Surgical History:  Procedure Laterality Date   CARDIAC CATHETERIZATION N/A 07/16/2015   Procedure: Left Heart Cath and Coronary Angiography;  Surgeon: Orpah Cobb, MD;  Location: [facility] INVASIVE CV LAB;  Service: Cardiovascular;  Laterality: N/A;   CARDIOVERSION N/A 07/04/2018   Procedure: CARDIOVERSION;  Surgeon: Elease Hashimoto Deloris Ping, MD;  Location: [facility] ENDOSCOPY;  Service: Cardiovascular;  Laterality: N/A;   CORONARY ARTERY BYPASS GRAFT     INTRAOPERATIVE TRANSESOPHAGEAL ECHOCARDIOGRAM N/A 06/28/2012   Procedure: INTRAOPERATIVE TRANSESOPHAGEAL ECHOCARDIOGRAM;  Surgeon: Purcell Nails, MD;  Location: [facility] OR;  Service: Open Heart Surgery;  Laterality: N/A;   LAPAROSCOPY N/A 05/21/2016   Procedure: LAPAROSCOPY DIAGNOSTIC, LYSIS OF ADHESION BAND;  Surgeon: Axel Filler, MD;  Location: [facility] OR;  Service: General;  Laterality: N/A;   LEFT AND RIGHT HEART CATHETERIZATION WITH CORONARY ANGIOGRAM N/A 05/22/2012   Procedure: LEFT AND RIGHT HEART CATHETERIZATION WITH CORONARY ANGIOGRAM;  Surgeon: Kathleene Hazel, MD;  Location: [facility] CATH LAB;  Service: Cardiovascular;  Laterality: N/A;   MINIMALLY INVASIVE MAZE PROCEDURE N/A 06/28/2012   Procedure: MINIMALLY INVASIVE MAZE PROCEDURE;  Surgeon: Purcell Nails, MD;  Location: [facility] OR;  Service: Open Heart Surgery;  Laterality: N/A;   MITRAL VALVE REPAIR Right 06/28/2012   Procedure: MINIMALLY INVASIVE MITRAL VALVE REPAIR (MVR);  Surgeon: Purcell Nails, MD;  Location: [facility] OR;  Service: Open Heart Surgery;  Laterality: Right;   OVARIAN CYST SURGERY  1988   TEE WITHOUT CARDIOVERSION  05/19/2012   Procedure: TRANSESOPHAGEAL ECHOCARDIOGRAM (TEE);  Surgeon: Dolores Patty, MD;  Location: [facility] ENDOSCOPY;  Service: Cardiovascular;  Laterality: N/A;   TUBAL LIGATION  1974   URETHRAL STRICTURE DILATATION     UTERINE FIBROID SURGERY  1988   `        OB History   No obstetric history on file.      Home Medications    Prior to Admission medications   Medication Sig Start Date End Date Taking? Authorizing Provider  ALPRAZolam (XANAX) 0.25 MG tablet Take 0.125-0.25 mg by mouth 2 (two) times daily as needed for anxiety.     [provider]  cholecalciferol (VITAMIN D) 1000 units tablet Take 1,000 Units by mouth daily.     [provider]  fluticasone (FLONASE) 50 MCG/ACT nasal spray Place 1 spray into both nostrils daily as needed for allergies.  12/08/12   [provider]  Menthol, Topical Analgesic, (ICY HOT EX) Apply 1 application topically daily as needed (back pain).    [provider]  methimazole (TAPAZOLE) 5 MG tablet Take 5 mg by mouth every morning.     [provider]  pantoprazole (PROTONIX) 40 MG tablet Take 40 mg by mouth daily.  08/24/12   Geoffry Paradise, MD  sotalol (BETAPACE) 80 MG  tablet Take 1 tablet (80 mg total) by mouth 2 (two) times daily. 06/30/18   Camnitz, Will Daphine Deutscher, MD  traMADol (ULTRAM) 50 MG tablet TAKE 1 TABLET BY MOUTH THREE TIMES DAILY AS NEEDED FOR PAIN 07/04/18 07/11/18  Yates Decamp, MD  XARELTO 15 MG TABS tablet TAKE 1 TABLET BY MOUTH DAILY WITH SUPPER Patient taking differently: Take 15 mg by mouth daily with supper. High risk med: Anticoagulant.  High risk med: Anticoagulant. 04/20/18   Camnitz, Andree Coss, MD    Family History Family History  Problem Relation Age of Onset   Colon cancer Father     Social History Social History   Tobacco Use   Smoking status: Never Smoker   Smokeless tobacco: Never Used  Substance Use Topics   Alcohol use: No    Comment: 1 drink a month   Drug use: No     Allergies   Amiodarone; Diltiazem; Macrobid [nitrofurantoin monohyd macro]; Mexiletine; Aspirin; Naproxen sodium; and Sulfa drugs cross reactors   Review of Systems Review of Systems  All other systems reviewed and are negative.    Physical  Exam Updated Vital Signs BP (!) 119/94    Pulse 83    Temp (!) 97.2 F (36.2 C) (Axillary)    Resp (!) 21    Ht  (1.676 m)    Wt 46.2 kg    SpO2 99%    BMI 16.43 kg/m   Physical Exam Vitals signs and nursing note reviewed.  Constitutional:      General: She is in acute distress.     Appearance: She is well-developed. She is cachectic.     Comments: Appears in pain  HENT:     Head: Normocephalic and atraumatic.  Eyes:     Pupils: Pupils are equal, round, and reactive to light.  Cardiovascular:     Rate and Rhythm: Normal rate and regular rhythm. Occasional extrasystoles are present.    Heart sounds: Normal heart sounds. No murmur. No friction rub.  Pulmonary:     Effort: Pulmonary effort is normal.     Breath sounds: Normal breath sounds. No wheezing or rales.  Abdominal:     General: Bowel sounds are normal. There is no distension.     Palpations: Abdomen is soft.     Tenderness: There is abdominal tenderness in the suprapubic area and left lower quadrant. There is no guarding or rebound.  Musculoskeletal: Normal range of motion.        General: No tenderness.     Comments: No edema  Skin:    General: Skin is warm and dry.     Capillary Refill: Capillary refill takes 2 to 3 seconds.     Findings: No rash.  Neurological:     General: No focal deficit present.     Mental Status: She is alert and oriented to person, place, and time. Mental status is at baseline.     Cranial Nerves: No cranial nerve deficit.  Psychiatric:        Mood and Affect: Mood normal.        Behavior: Behavior normal.        Thought Content: Thought content normal.      ED Treatments / Results  Labs (all labs ordered are listed, but only abnormal results are displayed) Labs Reviewed  CBC WITH DIFFERENTIAL/PLATELET - Abnormal; Notable for the following components:      Result Value   WBC 13.4 (*)    Neutro Abs 10.5 (*)  All other components within normal limits  COMPREHENSIVE METABOLIC  PANEL - Abnormal; Notable for the following components:   AST 60 (*)    ALT 49 (*)    All other components within normal limits  URINALYSIS, ROUTINE W REFLEX MICROSCOPIC - Abnormal; Notable for the following components:   Hgb urine dipstick SMALL (*)    Ketones, ur 5 (*)    All other components within normal limits  LIPASE, BLOOD  LACTIC ACID, PLASMA    EKG EKG Interpretation  Date/Time:  Sunday July 09 2018 10:45:52 EDT Ventricular Rate:  87 PR Interval:    QRS Duration: 85 QT Interval:  344 QTC Calculation: 377 R Axis:   -8 Text Interpretation:  Sinus rhythm Multiple ventricular premature complexes Borderline prolonged PR interval LVH with secondary repolarization abnormality Probable anterior infarct, age indeterminate Lateral leads are also involved Baseline wander in lead(s) III aVL aVF No significant change since last tracing Confirmed by Gwyneth Sprout ([id number]) on 07/09/2018 10:52:01 AM   Radiology Ct Abdomen Pelvis W Contrast  Result Date: 07/09/2018 CLINICAL DATA:  Patient with lower abdominal pain. EXAM: CT ABDOMEN AND PELVIS WITH CONTRAST TECHNIQUE: Multidetector CT imaging of the abdomen and pelvis was performed using the standard protocol following bolus administration of intravenous contrast. CONTRAST:  OMNIPAQUE IOHEXOL 300 MG/ML  SOLN COMPARISON:  CT CAP 12/05/2017 FINDINGS: Lower chest: Right heart enlargement. Small right pleural effusion. Bibasilar atelectasis. Hepatobiliary: Liver is normal in size and contour. Gallbladder is unremarkable. No intrahepatic or extrahepatic biliary ductal dilatation. Pancreas: Mildly atrophic Spleen: Unremarkable Adrenals/Urinary Tract: Normal adrenal glands. Kidneys enhance symmetrically with contrast. No hydronephrosis. Urinary bladder is unremarkable. Continued interval decrease in size of partially exophytic midpole lesion right kidney measuring 1.1 cm (image 32; series 3), previously 1.5 cm. This is nonspecific however may  represent an involuting complex cyst. Stomach/Bowel: Circumferential wall thickening of the descending colon. Stool throughout the colon. No evidence for small bowel obstruction. No free intraperitoneal air. Normal morphology of the stomach. Vascular/Lymphatic: Normal caliber abdominal aorta. Peripheral calcified atherosclerotic plaque. No retroperitoneal lymphadenopathy. Reproductive: Uterus and adnexal structures unremarkable. Other: Small amount of free fluid in the pelvis. Musculoskeletal: Lumbar spine degenerative changes. No aggressive or acute appearing osseous lesions. Unchanged superior L3 wedge compression deformity. IMPRESSION: 1. Circumferential wall thickening of the descending colon most compatible with colitis. 2. Small right pleural effusion. Right-greater-than-left basilar consolidation may represent atelectasis or infection. Electronically Signed   By: Annia Belt M.D.   On: 07/09/2018 13:15    Procedures Procedures (including critical care time)  Medications Ordered in ED Medications  0.9 %  sodium chloride infusion (has no administration in time range)  ondansetron (ZOFRAN) injection 4 mg (has no administration in time range)  morphine 4 MG/ML injection 4 mg (has no administration in time range)     Initial Impression / Assessment and Plan / ED Course  I have reviewed the triage vital signs and the nursing notes.  Pertinent labs & imaging results that were available during my care of the patient were reviewed by me and considered in my medical decision making (see chart for details).       Elderly female presenting today with sudden onset of severe abdominal pain that started approximately an hour and a half ago after she ate 2 bites of her breakfast.  Patient denies ever having pain like this before but does have prior history of pelvic surgeries and prior small bowel obstruction.  She states immediately she felt nauseated but  has not vomited.  Patient has bowel sounds on  exam and some lower abdominal tenderness but no rebound or guarding and abdomen is soft.  Patient does take Xarelto daily and of note had a cardioversion 5 days ago.  Patient has not missed any of her anticoagulation has been anticoagulated for some time however concern for mesenteric ischemia versus bowel obstruction versus severe constipation.  Patient is having no chest pain, shortness of breath and displays no signs of CHF at this time.  Low suspicion for cardiac or lung pathology.  EKG shows sinus rhythm with PVCs.  Labs and CT pending.  Patient given pain control.  2:41 PM Patient's labs are consistent with a mild leukocytosis of 13, CMP with mild elevated LFTs and normal lipase and a normal lactate.  Less concern for mesenteric ischemia in this realm with normal lactic acid and ongoing anticoagulation.  CT showed circumferential wall thickening of the descending colon most compatible with colitis.  Also a small right pleural effusion.  On reevaluation patient after 1 dose of pain medicine is feeling much better and tolerating p.o.'s.  She has now given more history and describes of a lot of constipation likely from taking tramadol 3 times a day.  She was instructed to do a very bland diet and use miralax to help with constipation.  UA wnl  Pt tolerating po's without worsening sx.  Will d/c home.  Final Clinical Impressions(s) / ED Diagnoses   Final diagnoses:  Colitis    ED Discharge Orders    None       Gwyneth SproutPlunkett, Marieli Rudy, MD 07/09/18 1556

## 2018-07-11 ENCOUNTER — Emergency Department (HOSPITAL_COMMUNITY): Payer: PPO

## 2018-07-11 ENCOUNTER — Telehealth: Payer: Self-pay | Admitting: Physician Assistant

## 2018-07-11 ENCOUNTER — Emergency Department (HOSPITAL_COMMUNITY)
Admission: EM | Admit: 2018-07-11 | Discharge: 2018-07-11 | Disposition: A | Discharge status: Home / Self Care | Payer: PPO | Attending: Emergency Medicine | Admitting: Emergency Medicine

## 2018-07-11 ENCOUNTER — Encounter (HOSPITAL_COMMUNITY): Payer: Self-pay

## 2018-07-11 DIAGNOSIS — I4891 Unspecified atrial fibrillation: Secondary | ICD-10-CM | POA: Diagnosis not present

## 2018-07-11 DIAGNOSIS — I491 Atrial premature depolarization: Secondary | ICD-10-CM | POA: Diagnosis not present

## 2018-07-11 DIAGNOSIS — Z79899 Other long term (current) drug therapy: Secondary | ICD-10-CM | POA: Diagnosis not present

## 2018-07-11 DIAGNOSIS — I509 Heart failure, unspecified: Secondary | ICD-10-CM | POA: Insufficient documentation

## 2018-07-11 DIAGNOSIS — K5730 Diverticulosis of large intestine without perforation or abscess without bleeding: Secondary | ICD-10-CM | POA: Diagnosis not present

## 2018-07-11 DIAGNOSIS — I499 Cardiac arrhythmia, unspecified: Secondary | ICD-10-CM | POA: Diagnosis not present

## 2018-07-11 DIAGNOSIS — K529 Noninfective gastroenteritis and colitis, unspecified: Secondary | ICD-10-CM | POA: Insufficient documentation

## 2018-07-11 DIAGNOSIS — R1030 Lower abdominal pain, unspecified: Secondary | ICD-10-CM | POA: Diagnosis not present

## 2018-07-11 DIAGNOSIS — I2510 Atherosclerotic heart disease of native coronary artery without angina pectoris: Secondary | ICD-10-CM | POA: Diagnosis not present

## 2018-07-11 DIAGNOSIS — Z7901 Long term (current) use of anticoagulants: Secondary | ICD-10-CM | POA: Insufficient documentation

## 2018-07-11 DIAGNOSIS — R1084 Generalized abdominal pain: Secondary | ICD-10-CM | POA: Diagnosis not present

## 2018-07-11 LAB — COMPREHENSIVE METABOLIC PANEL
ALBUMIN: 3.6 g/dL (ref 3.5–5.0)
ALT: 26 U/L (ref 0–44)
ANION GAP: 6 (ref 5–15)
AST: 21 U/L (ref 15–41)
Alkaline Phosphatase: 84 U/L (ref 38–126)
BUN: 11 mg/dL (ref 8–23)
CALCIUM: 10.1 mg/dL (ref 8.9–10.3)
CO2: 24 mmol/L (ref 22–32)
Chloride: 104 mmol/L (ref 98–111)
Creatinine, Ser: 0.77 mg/dL (ref 0.44–1.00)
GFR calc Af Amer: >60 mL/min (ref >60)
GFR calc non Af Amer: >60 mL/min (ref >60)
Glucose, Bld: 106 mg/dL — ABNORMAL HIGH (ref 70–99)
Potassium: 3.9 mmol/L (ref 3.5–5.1)
Sodium: 134 mmol/L — ABNORMAL LOW (ref 135–145)
Total Bilirubin: 0.8 mg/dL (ref 0.3–1.2)
Total Protein: 6.5 g/dL (ref 6.5–8.1)

## 2018-07-11 LAB — CBC WITH DIFFERENTIAL/PLATELET
Abs Immature Granulocytes: 0.02 10*3/uL (ref 0.00–0.07)
Basophils Absolute: 0 10*3/uL (ref 0.0–0.1)
Basophils Relative: 0 %
EOS ABS: 0.1 10*3/uL (ref 0.0–0.5)
EOS PCT: 1 %
HEMATOCRIT: 36.9 % (ref 36.0–46.0)
HEMOGLOBIN: 11.8 g/dL — AB (ref 12.0–15.0)
Immature Granulocytes: 0 %
LYMPHS PCT: 29 %
Lymphs Abs: 2.2 10*3/uL (ref 0.7–4.0)
MCH: 29.3 pg (ref 26.0–34.0)
MCHC: 32 g/dL (ref 30.0–36.0)
MCV: 91.6 fL (ref 80.0–100.0)
Monocytes Absolute: 0.7 10*3/uL (ref 0.1–1.0)
Monocytes Relative: 8 %
Neutro Abs: 4.8 10*3/uL (ref 1.7–7.7)
Neutrophils Relative %: 62 %
Platelets: 214 10*3/uL (ref 150–400)
RBC: 4.03 MIL/uL (ref 3.87–5.11)
RDW: 13.2 % (ref 11.5–15.5)
WBC: 7.7 10*3/uL (ref 4.0–10.5)
nRBC: 0 % (ref 0.0–0.2)

## 2018-07-11 LAB — URINALYSIS, ROUTINE W REFLEX MICROSCOPIC
Bacteria, UA: NONE SEEN
Bilirubin Urine: NEGATIVE
Glucose, UA: NEGATIVE mg/dL
Ketones, ur: NEGATIVE mg/dL
Leukocytes,Ua: NEGATIVE
Nitrite: NEGATIVE
Protein, ur: NEGATIVE mg/dL
Specific Gravity, Urine: 1.014 (ref 1.005–1.030)
pH: 7 (ref 5.0–8.0)

## 2018-07-11 LAB — LIPASE, BLOOD: Lipase: 27 U/L (ref 11–51)

## 2018-07-11 MED ORDER — METRONIDAZOLE 500 MG PO TABS
500.0000 mg | ORAL_TABLET | Freq: Once | ORAL | Status: AC
  Administered 2018-07-11: 500 mg via ORAL
  Filled 2018-07-11: qty 1

## 2018-07-11 MED ORDER — CIPROFLOXACIN HCL 500 MG PO TABS
500.0000 mg | ORAL_TABLET | Freq: Once | ORAL | Status: AC
  Administered 2018-07-11: 500 mg via ORAL
  Filled 2018-07-11: qty 1

## 2018-07-11 MED ORDER — CIPROFLOXACIN HCL 500 MG PO TABS: 500.0000 mg | ORAL_TABLET | Freq: Two times a day (BID) | ORAL | 0 refills | Status: AC

## 2018-07-11 MED ORDER — SODIUM CHLORIDE 0.9 % IV BOLUS
1000.0000 mL | Freq: Once | INTRAVENOUS | Status: AC
  Administered 2018-07-11: 1000 mL via INTRAVENOUS

## 2018-07-11 MED ORDER — FENTANYL CITRATE (PF) 100 MCG/2ML IJ SOLN
50.0000 ug | Freq: Once | INTRAMUSCULAR | Status: AC
  Administered 2018-07-11: 50 ug via INTRAVENOUS
  Filled 2018-07-11: qty 2

## 2018-07-11 MED ORDER — METRONIDAZOLE 500 MG PO TABS: 500.0000 mg | ORAL_TABLET | Freq: Three times a day (TID) | ORAL | 0 refills | Status: AC

## 2018-07-11 MED ORDER — IOHEXOL 300 MG/ML  SOLN
100.0000 mL | Freq: Once | INTRAMUSCULAR | Status: AC | PRN
  Administered 2018-07-11: 100 mL via INTRAVENOUS

## 2018-07-11 NOTE — ED Notes (Signed)
ED Provider at bedside. 

## 2018-07-11 NOTE — ED Notes (Signed)
Per CT, line infiltrated. CT to attempt new line. Will continue to monitor.

## 2018-07-11 NOTE — Telephone Encounter (Signed)
See the ER note please.  The patient still has not had a "normal" or a "good" bowel movement. She passed "2 hard balls of stool" Sunday. Now she is passing "brown stuff but mostly this clear gooey stuff. I think it is pus." She has done this several times today. Passing a lot of gas. She has taken Miralx once today. Taken Benefiber once today. She has consumed 2 regular size bottles of water. Her abdomen is uncomfortable. She states it is not as severe as Sunday or she would go back to the ER. She is scheduled to see her PCP tomorrow morning at 8:30 am.

## 2018-07-11 NOTE — ED Notes (Signed)
Pt aware of need for stool sample. No stool in brief. Pt notified to press call bell when she needs bed pan. Pt aware of need for urine sample. Pt aware pain meds available if desired.

## 2018-07-11 NOTE — ED Notes (Signed)
Patient transported to CT 

## 2018-07-11 NOTE — Discharge Instructions (Addendum)
Follow-up with your GI doctor.  Return to ED if symptoms worsen.

## 2018-07-11 NOTE — Telephone Encounter (Signed)
Pt is following up on the previous msg.

## 2018-07-11 NOTE — Telephone Encounter (Signed)
Pt was seen in the ED 3.15.20 for colitis.  Pt's last OV was June 2019 with Amy--pt is requesting Amy to prescribe her something for the severe pain.  She is crying.    Pt's pharmacy is [facility] on [address].

## 2018-07-11 NOTE — ED Provider Notes (Signed)
[facility] EMERGENCY DEPARTMENT Provider Note   CSN: [id number] Arrival date & time: 07/11/18  1928    History   Chief Complaint Chief Complaint  Patient presents with   Abdominal Pain    HPI [name] [name] is a 78 y.o. female.     The history is provided by the patient.  Abdominal Pain  Pain location:  Generalized Pain quality: aching and bloating   Pain radiates to:  Does not radiate Pain severity:  Mild Onset quality:  Gradual Timing:  Intermittent Progression:  Waxing and waning Chronicity:  New Context: not sick contacts and not suspicious food intake   Relieved by:  Nothing Worsened by:  Nothing Associated symptoms: diarrhea   Associated symptoms: no chest pain, no chills, no cough, no dysuria, no fever, no hematuria, no shortness of breath, no sore throat and no vomiting     Past Medical History:  Diagnosis Date   Acute coronary syndrome (HCC) 06/2015   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 Dx 04/2012;  b. Amio d/c'd 2/2 hyperthyroidism;  c. Atrial flutter 10/2012;  d. On xarelto.   Bigeminy    CHF (congestive heart failure) (HCC)    Complication of anesthesia    Coronary artery disease    Diverticulitis    GERD (gastroesophageal reflux disease)    Goiter    Headache    Heart murmur    Hyperlipidemia    recently- taken off Crestor- due to pain in her legs, but this was after cardiac cath. , pt. questioning whether the pain in her legs was related to cath. or crestor   Hypertension    Then hypotension 04/2012 limiting med adjustment.   Intractable vomiting    Malnutrition of moderate degree (HCC)    Microscopic hematuria 08/20/2012   Mitral valve prolapse    a. H/o MVP, with severe MR 04/2012, had MV repair and Maze   Pneumonia    treated /w antibiotic- [facility]- early Feb. 2014   PONV (postoperative  nausea and vomiting)    Refusal of blood transfusions as patient is Jehovah's Witness    S/P Maze operation for atrial fibrillation 06/28/2012   Complete bilateral atrial lesion set using cryothermy ablation with oversewing of LA appendage via right minithoracotomy   S/P mitral valve repair 06/28/2012   Complex valvuloplasty including quadrangular resection of posterior leaflet, sliding leaflet plasty, artificial Gore-tex neocord placement x2 and 32mm Sorin Memo 3D ring annuloplasty via right mini thoracotomy    SBO (small bowel obstruction) (HCC)    Shortness of breath     Patient Active Problem List   Diagnosis Date Noted   Malnutrition of moderate degree 05/17/2016   Bigeminy    Intractable vomiting 05/15/2016   Heart palpitations 05/15/2016   SBO (small bowel obstruction) (HCC) 05/15/2016   Acute coronary syndrome (HCC) 07/15/2015   Status post Maze operation for atrial fibrillation 01/08/2013   S/P MVR (mitral valve repair) 01/08/2013   Atrial flutter (HCC) 10/30/2012   Amaurosis fugax 10/30/2012   Dyspnea 10/28/2012   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   Thyroid goiter 08/21/2012   Microscopic hematuria 08/20/2012   S/P mitral valve repair 06/28/2012   History of mitral valve prolapse with severe mitral regurgtation 05/19/2012   Refusal of blood transfusions as patient is Jehovah's Witness 05/19/2012   Atrial fibrillation (HCC) 05/16/2012  Anxiety 04/27/2011   Hypertension 04/26/2011   Diverticulitis 04/24/2011   Hyperlipidemia     Past Surgical History:  Procedure Laterality Date   CARDIAC CATHETERIZATION N/A 07/16/2015   Procedure: Left Heart Cath and Coronary Angiography;  Surgeon: Orpah Cobb, MD;  Location: [facility] INVASIVE CV LAB;  Service: Cardiovascular;  Laterality: N/A;   CARDIOVERSION N/A 07/04/2018   Procedure: CARDIOVERSION;  Surgeon: Elease Hashimoto Deloris Ping, MD;  Location: [facility] ENDOSCOPY;  Service: Cardiovascular;  Laterality: N/A;    CORONARY ARTERY BYPASS GRAFT     INTRAOPERATIVE TRANSESOPHAGEAL ECHOCARDIOGRAM N/A 06/28/2012   Procedure: INTRAOPERATIVE TRANSESOPHAGEAL ECHOCARDIOGRAM;  Surgeon: Purcell Nails, MD;  Location: [facility] OR;  Service: Open Heart Surgery;  Laterality: N/A;   LAPAROSCOPY N/A 05/21/2016   Procedure: LAPAROSCOPY DIAGNOSTIC, LYSIS OF ADHESION BAND;  Surgeon: Axel Filler, MD;  Location: [facility] OR;  Service: General;  Laterality: N/A;   LEFT AND RIGHT HEART CATHETERIZATION WITH CORONARY ANGIOGRAM N/A 05/22/2012   Procedure: LEFT AND RIGHT HEART CATHETERIZATION WITH CORONARY ANGIOGRAM;  Surgeon: Kathleene Hazel, MD;  Location: [facility] CATH LAB;  Service: Cardiovascular;  Laterality: N/A;   MINIMALLY INVASIVE MAZE PROCEDURE N/A 06/28/2012   Procedure: MINIMALLY INVASIVE MAZE PROCEDURE;  Surgeon: Purcell Nails, MD;  Location: [facility] OR;  Service: Open Heart Surgery;  Laterality: N/A;   MITRAL VALVE REPAIR Right 06/28/2012   Procedure: MINIMALLY INVASIVE MITRAL VALVE REPAIR (MVR);  Surgeon: Purcell Nails, MD;  Location: [facility] OR;  Service: Open Heart Surgery;  Laterality: Right;   OVARIAN CYST SURGERY  1988   TEE WITHOUT CARDIOVERSION  05/19/2012   Procedure: TRANSESOPHAGEAL ECHOCARDIOGRAM (TEE);  Surgeon: Dolores Patty, MD;  Location: [facility] ENDOSCOPY;  Service: Cardiovascular;  Laterality: N/A;   TUBAL LIGATION  1974   URETHRAL STRICTURE DILATATION     UTERINE FIBROID SURGERY  1988   `       OB History   No obstetric history on file.      Home Medications    Prior to Admission medications   Medication Sig Start Date End Date Taking? Authorizing Provider  ALPRAZolam (XANAX) 0.25 MG tablet Take 0.125-0.25 mg by mouth 2 (two) times daily as needed for anxiety.     [provider]  cholecalciferol (VITAMIN D) 1000 units tablet Take 1,000 Units by mouth daily.     [provider]  ciprofloxacin (CIPRO) 500 MG tablet Take 1 tablet (500 mg total) by mouth 2 (two) times daily for 7  days. 07/11/18 07/18/18  Alphia Behanna, DO  fluticasone (FLONASE) 50 MCG/ACT nasal spray Place 1 spray into both nostrils daily as needed for allergies.  12/08/12   [provider]  Menthol, Topical Analgesic, (ICY HOT EX) Apply 1 application topically daily as needed (back pain).    [provider]  methimazole (TAPAZOLE) 5 MG tablet Take 5 mg by mouth every morning.     [provider]  metroNIDAZOLE (FLAGYL) 500 MG tablet Take 1 tablet (500 mg total) by mouth 3 (three) times daily for 7 days. 07/11/18 07/18/18  Valeen Borys, DO  pantoprazole (PROTONIX) 40 MG tablet Take 40 mg by mouth daily.  08/24/12   Geoffry Paradise, MD  sotalol (BETAPACE) 80 MG tablet Take 1 tablet (80 mg total) by mouth 2 (two) times daily. 06/30/18   Camnitz, Will Daphine Deutscher, MD  traMADol (ULTRAM) 50 MG tablet TAKE 1 TABLET BY MOUTH THREE TIMES DAILY AS NEEDED FOR PAIN 07/04/18 07/11/18  Yates Decamp, MD  XARELTO 15 MG TABS tablet TAKE 1  TABLET BY MOUTH DAILY WITH SUPPER Patient taking differently: Take 15 mg by mouth daily with supper. High risk med: Anticoagulant.  High risk med: Anticoagulant. 04/20/18   Camnitz, Andree Coss, MD    Family History Family History  Problem Relation Age of Onset   Colon cancer Father     Social History Social History   Tobacco Use   Smoking status: Never Smoker   Smokeless tobacco: Never Used  Substance Use Topics   Alcohol use: No    Comment: 1 drink a month   Drug use: No     Allergies   Amiodarone; Diltiazem; Macrobid [nitrofurantoin monohyd macro]; Mexiletine; Aspirin; Naproxen sodium; and Sulfa drugs cross reactors   Review of Systems Review of Systems  Constitutional: Negative for chills and fever.  HENT: Negative for ear pain and sore throat.   Eyes: Negative for pain and visual disturbance.  Respiratory: Negative for cough and shortness of breath.   Cardiovascular: Negative for chest pain and palpitations.  Gastrointestinal: Positive for  abdominal pain and diarrhea. Negative for vomiting.  Genitourinary: Negative for dysuria and hematuria.  Musculoskeletal: Negative for arthralgias and back pain.  Skin: Negative for color change and rash.  Neurological: Negative for seizures and syncope.  All other systems reviewed and are negative.    Physical Exam Updated Vital Signs BP 108/68    Pulse 77    Temp 98 F (36.7 C) (Oral)    Resp 13    SpO2 94%   Physical Exam Vitals signs and nursing note reviewed.  Constitutional:      General: She is not in acute distress.    Appearance: She is well-developed.  HENT:     Head: Normocephalic and atraumatic.  Eyes:     Extraocular Movements: Extraocular movements intact.     Conjunctiva/sclera: Conjunctivae normal.     Pupils: Pupils are equal, round, and reactive to light.  Neck:     Musculoskeletal: Neck supple.  Cardiovascular:     Rate and Rhythm: Normal rate and regular rhythm.     Heart sounds: Normal heart sounds. No murmur.  Pulmonary:     Effort: Pulmonary effort is normal. No respiratory distress.     Breath sounds: Normal breath sounds.  Abdominal:     General: Abdomen is flat.     Palpations: Abdomen is soft.     Tenderness: There is generalized abdominal tenderness. There is no right CVA tenderness, left CVA tenderness, guarding or rebound. Negative signs include Murphy's sign, McBurney's sign and psoas sign.  Skin:    General: Skin is warm and dry.     Capillary Refill: Capillary refill takes less than 2 seconds.  Neurological:     General: No focal deficit present.     Mental Status: She is alert.  Psychiatric:        Mood and Affect: Mood normal.      ED Treatments / Results  Labs (all labs ordered are listed, but only abnormal results are displayed) Labs Reviewed  COMPREHENSIVE METABOLIC PANEL - Abnormal; Notable for the following components:      Result Value   Sodium 134 (*)    Glucose, Bld 106 (*)    All other components within normal limits   CBC WITH DIFFERENTIAL/PLATELET - Abnormal; Notable for the following components:   Hemoglobin 11.8 (*)    All other components within normal limits  URINALYSIS, ROUTINE W REFLEX MICROSCOPIC - Abnormal; Notable for the following components:   Color, Urine STRAW (*)  Hgb urine dipstick MODERATE (*)    All other components within normal limits  URINE CULTURE  LIPASE, BLOOD    EKG EKG Interpretation  Date/Time:  Tuesday July 11 2018 19:30:37 EDT Ventricular Rate:  94 PR Interval:    QRS Duration: 118 QT Interval:  371 QTC Calculation: 355 R Axis:   -52 Text Interpretation:  Sinus rhythm Paired ventricular premature complexes LAD, consider left anterior fascicular block LVH with secondary repolarization abnormality Probable anterior infarct, age indeterminate Confirmed by Virgina Norfolk [phone]) on 07/11/2018 11:49:45 PM   Radiology Ct Abdomen Pelvis W Contrast  Result Date: 07/11/2018 CLINICAL DATA:  Increasing abdominal pain and dizziness EXAM: CT ABDOMEN AND PELVIS WITH CONTRAST TECHNIQUE: Multidetector CT imaging of the abdomen and pelvis was performed using the standard protocol following bolus administration of intravenous contrast. CONTRAST:  OMNIPAQUE IOHEXOL 300 MG/ML  SOLN COMPARISON:  07/09/2018 FINDINGS: Lower chest: Lung bases are well aerated with small right pleural effusion stable from the previous exam. Cardiomegaly is again seen. Hepatobiliary: Gallbladder shows vicarious excretion of contrast. The liver is within normal limits. Pancreas: Pancreas is unchanged from the previous exam. Spleen: Normal in size without focal abnormality. Adrenals/Urinary Tract: Adrenal glands are within normal limits. Kidneys are well visualized without renal calculi or obstructive changes. A focal fatty lesion is noted in the lower pole of the left kidney consistent with small angiomyolipoma. Stable exophytic lesion arising from the medial aspect of the mid right kidney is again noted and  stable. The bladder is well distended. Stomach/Bowel: Diverticular change of the colon is noted. The degree of wall thickening has improved in the interval from the prior exam. No focal abscess formation is noted. The appendix is not well visualized although no inflammatory changes to suggest appendicitis are seen. Small bowel is unremarkable. Vascular/Lymphatic: Aortic atherosclerosis. No enlarged abdominal or pelvic lymph nodes. Reproductive: Uterus and bilateral adnexa are unremarkable. Other: No abdominal wall hernia or abnormality. No abdominopelvic ascites. Musculoskeletal: Degenerative changes of the lumbar spine are seen. Stable L3 compression deformity is noted. IMPRESSION: Small right effusions stable from the previous exam. Stable lesions within the kidneys bilaterally. Diverticular change of the colon with some improvement in the degree of wall thickening. No obstructive changes are seen. Electronically Signed   By: Alcide Clever M.D.   On: 07/11/2018 21:22    Procedures Procedures (including critical care time)  Medications Ordered in ED Medications  sodium chloride 0.9 % bolus 1,000 mL (0 mLs Intravenous Stopped 07/11/18 2315)  fentaNYL (SUBLIMAZE) injection 50 mcg (50 mcg Intravenous Given 07/11/18 2143)  iohexol (OMNIPAQUE) 300 MG/ML solution 100 mL (100 mLs Intravenous Contrast Given 07/11/18 2055)  ciprofloxacin (CIPRO) tablet 500 mg (500 mg Oral Given 07/11/18 2327)  metroNIDAZOLE (FLAGYL) tablet 500 mg (500 mg Oral Given 07/11/18 2327)     Initial Impression / Assessment and Plan / ED Course  I have reviewed the triage vital signs and the nursing notes.  Pertinent labs & imaging results that were available during my care of the patient were reviewed by me and considered in my medical decision making (see chart for details).     [name] [name] is a 78 year old female with history of atrial fibrillation on Xarelto who presents to the ED with continued abdominal pain.  Patient  with unremarkable vitals.  No fever.  Patient states that she was diagnosed with colitis several days ago.  However, she did not get antibiotics.  She continues to have some diarrhea and crampy abdominal  pain.  She has a history of diverticulitis.  She is tender overall diffusely on exam but appears worse in the left lower quadrant.  She denies any urinary symptoms.  She denies any nausea, vomiting.  Patient had lab work that showed no leukocytosis.  Patient with no fever.  Low concern for C. difficile.  CT scan did show some diverticular inflammation.  Overall is improved from prior CT scan.  Will treat with Cipro and Flagyl.  Patient with no urinary tract infection.  No electrolyte abnormalities, kidney injury.  Overall appears to be improving.  Recommend follow-up with GI and primary care doctor.  Discharged from ED in good condition.  EKG unremarkable.  This chart was dictated using voice recognition software.  Despite best efforts to proofread,  errors can occur which can change the documentation meaning.    Final Clinical Impressions(s) / ED Diagnoses   Final diagnoses:  Colitis    ED Discharge Orders         Ordered    metroNIDAZOLE (FLAGYL) 500 MG tablet  3 times daily     07/11/18 2314    ciprofloxacin (CIPRO) 500 MG tablet  2 times daily     07/11/18 2314           Virgina Norfolk, DO 07/11/18 2350

## 2018-07-11 NOTE — ED Triage Notes (Addendum)
Pt arrives from home via [facility], was dx with colitis and was cardioverted on Sunday. Pt reports having increase in pain this afternoon and intermittent dizziness. Pt reports some dizziness still, but mostly with movement. Pt currently having lower abdominal pain 2/10. Pt endorses diarrhea. Pt has abnormal EKG, hx a fib.

## 2018-07-12 DIAGNOSIS — K529 Noninfective gastroenteritis and colitis, unspecified: Secondary | ICD-10-CM | POA: Diagnosis not present

## 2018-07-12 LAB — URINE CULTURE: CULTURE: NO GROWTH

## 2018-07-12 NOTE — Telephone Encounter (Signed)
Left message to call me back on the home phone  [phone]

## 2018-07-12 NOTE — Telephone Encounter (Signed)
Please call pt - I see that she went to ER yesterday - had repeat CT that showed probable diverticulitis improved over the prior CT - Started Cipro and flagyl. See if she is feeling better - when she called yesterday your message said she was asking for pain med - she could have tramadol 50 mg q 6 hours prn  #30 /0 if she needs something for pain

## 2018-07-12 NOTE — Telephone Encounter (Signed)
No answer or voicemail on [phone]

## 2018-07-19 ENCOUNTER — Ambulatory Visit: Payer: Self-pay | Admitting: Cardiology

## 2018-08-08 DIAGNOSIS — I480 Paroxysmal atrial fibrillation: Secondary | ICD-10-CM | POA: Diagnosis not present

## 2018-08-08 DIAGNOSIS — R0609 Other forms of dyspnea: Secondary | ICD-10-CM | POA: Diagnosis not present

## 2018-08-08 DIAGNOSIS — I5020 Unspecified systolic (congestive) heart failure: Secondary | ICD-10-CM | POA: Diagnosis not present

## 2018-08-18 ENCOUNTER — Telehealth: Payer: Self-pay | Admitting: Internal Medicine

## 2018-09-15 DIAGNOSIS — R002 Palpitations: Secondary | ICD-10-CM | POA: Diagnosis not present

## 2018-09-15 DIAGNOSIS — I080 Rheumatic disorders of both mitral and aortic valves: Secondary | ICD-10-CM | POA: Diagnosis not present

## 2018-09-15 DIAGNOSIS — R55 Syncope and collapse: Secondary | ICD-10-CM | POA: Diagnosis not present

## 2018-09-19 DIAGNOSIS — I480 Paroxysmal atrial fibrillation: Secondary | ICD-10-CM | POA: Diagnosis not present

## 2018-09-19 DIAGNOSIS — I5020 Unspecified systolic (congestive) heart failure: Secondary | ICD-10-CM | POA: Diagnosis not present

## 2018-09-19 DIAGNOSIS — R0600 Dyspnea, unspecified: Secondary | ICD-10-CM | POA: Diagnosis not present

## 2018-10-13 DIAGNOSIS — I4892 Unspecified atrial flutter: Secondary | ICD-10-CM | POA: Diagnosis not present

## 2018-10-13 DIAGNOSIS — I4891 Unspecified atrial fibrillation: Secondary | ICD-10-CM | POA: Diagnosis not present

## 2018-10-13 DIAGNOSIS — Z9889 Other specified postprocedural states: Secondary | ICD-10-CM | POA: Diagnosis not present

## 2018-10-13 DIAGNOSIS — I5020 Unspecified systolic (congestive) heart failure: Secondary | ICD-10-CM | POA: Diagnosis not present

## 2018-10-19 ENCOUNTER — Ambulatory Visit: Payer: Self-pay | Admitting: Cardiology

## 2018-10-30 DIAGNOSIS — I480 Paroxysmal atrial fibrillation: Secondary | ICD-10-CM | POA: Diagnosis not present

## 2018-10-30 DIAGNOSIS — M1990 Unspecified osteoarthritis, unspecified site: Secondary | ICD-10-CM | POA: Diagnosis not present

## 2018-10-30 DIAGNOSIS — I110 Hypertensive heart disease with heart failure: Secondary | ICD-10-CM | POA: Diagnosis not present

## 2018-10-30 DIAGNOSIS — E042 Nontoxic multinodular goiter: Secondary | ICD-10-CM | POA: Diagnosis not present

## 2018-10-30 DIAGNOSIS — E0590 Thyrotoxicosis, unspecified without thyrotoxic crisis or storm: Secondary | ICD-10-CM | POA: Diagnosis not present

## 2018-10-30 DIAGNOSIS — K219 Gastro-esophageal reflux disease without esophagitis: Secondary | ICD-10-CM | POA: Diagnosis not present

## 2018-10-30 DIAGNOSIS — E785 Hyperlipidemia, unspecified: Secondary | ICD-10-CM | POA: Diagnosis not present

## 2018-10-30 DIAGNOSIS — I5020 Unspecified systolic (congestive) heart failure: Secondary | ICD-10-CM | POA: Diagnosis not present

## 2018-10-30 DIAGNOSIS — I10 Essential (primary) hypertension: Secondary | ICD-10-CM | POA: Diagnosis not present

## 2018-10-30 DIAGNOSIS — M545 Low back pain: Secondary | ICD-10-CM | POA: Diagnosis not present

## 2018-10-30 DIAGNOSIS — Z1331 Encounter for screening for depression: Secondary | ICD-10-CM | POA: Diagnosis not present

## 2018-10-30 DIAGNOSIS — I341 Nonrheumatic mitral (valve) prolapse: Secondary | ICD-10-CM | POA: Diagnosis not present

## 2018-10-30 DIAGNOSIS — M810 Age-related osteoporosis without current pathological fracture: Secondary | ICD-10-CM | POA: Diagnosis not present

## 2018-11-05 ENCOUNTER — Other Ambulatory Visit: Payer: Self-pay | Admitting: Cardiology

## 2018-11-06 ENCOUNTER — Other Ambulatory Visit: Payer: Self-pay | Admitting: Cardiology

## 2018-11-06 NOTE — Telephone Encounter (Signed)
Pt last saw Dr Curt Bears 07/03/18, last labs 07/11/18 Creat 0.77, age 78, weight 46.4kg, CrCl 44.82, based on CrCl pt is on appropriate dosage of Xarelto 15mg  QD.  Will refill rx.

## 2018-11-07 NOTE — Telephone Encounter (Signed)
Prescription refill request for Xarelto received.   Last office visit:  Dr. Curt Bears (06-30-2018) Weight: 46.4 kg ( 06-30-2018) Age: 78 y.o. Scr: 0.77 (07-11-2018) CrCl: 45 ml/min  Prescription refill sent.

## 2018-11-20 DIAGNOSIS — I493 Ventricular premature depolarization: Secondary | ICD-10-CM | POA: Diagnosis not present

## 2018-11-20 DIAGNOSIS — I700 Atherosclerosis of aorta: Secondary | ICD-10-CM | POA: Diagnosis not present

## 2018-11-20 DIAGNOSIS — R000 Tachycardia, unspecified: Secondary | ICD-10-CM | POA: Diagnosis not present

## 2018-11-20 DIAGNOSIS — I341 Nonrheumatic mitral (valve) prolapse: Secondary | ICD-10-CM | POA: Diagnosis not present

## 2018-11-20 DIAGNOSIS — I6782 Cerebral ischemia: Secondary | ICD-10-CM | POA: Diagnosis not present

## 2018-11-20 DIAGNOSIS — F419 Anxiety disorder, unspecified: Secondary | ICD-10-CM | POA: Diagnosis not present

## 2018-11-20 DIAGNOSIS — R51 Headache: Secondary | ICD-10-CM | POA: Diagnosis not present

## 2018-11-20 DIAGNOSIS — I5020 Unspecified systolic (congestive) heart failure: Secondary | ICD-10-CM | POA: Diagnosis not present

## 2018-11-20 DIAGNOSIS — G43909 Migraine, unspecified, not intractable, without status migrainosus: Secondary | ICD-10-CM | POA: Diagnosis not present

## 2018-11-20 DIAGNOSIS — I471 Supraventricular tachycardia: Secondary | ICD-10-CM | POA: Diagnosis not present

## 2018-11-20 DIAGNOSIS — I252 Old myocardial infarction: Secondary | ICD-10-CM | POA: Diagnosis not present

## 2018-11-20 DIAGNOSIS — R9431 Abnormal electrocardiogram [ECG] [EKG]: Secondary | ICD-10-CM | POA: Diagnosis not present

## 2018-11-20 DIAGNOSIS — R0602 Shortness of breath: Secondary | ICD-10-CM | POA: Diagnosis not present

## 2018-11-20 DIAGNOSIS — I480 Paroxysmal atrial fibrillation: Secondary | ICD-10-CM | POA: Diagnosis not present

## 2018-11-20 DIAGNOSIS — E0590 Thyrotoxicosis, unspecified without thyrotoxic crisis or storm: Secondary | ICD-10-CM | POA: Diagnosis not present

## 2018-11-20 DIAGNOSIS — I110 Hypertensive heart disease with heart failure: Secondary | ICD-10-CM | POA: Diagnosis not present

## 2018-11-21 DIAGNOSIS — Z7901 Long term (current) use of anticoagulants: Secondary | ICD-10-CM | POA: Diagnosis not present

## 2018-11-21 DIAGNOSIS — I348 Other nonrheumatic mitral valve disorders: Secondary | ICD-10-CM | POA: Diagnosis not present

## 2018-11-21 DIAGNOSIS — I493 Ventricular premature depolarization: Secondary | ICD-10-CM | POA: Diagnosis not present

## 2018-11-21 DIAGNOSIS — I4892 Unspecified atrial flutter: Secondary | ICD-10-CM | POA: Diagnosis not present

## 2018-11-21 DIAGNOSIS — Z66 Do not resuscitate: Secondary | ICD-10-CM | POA: Diagnosis not present

## 2018-11-21 DIAGNOSIS — I4891 Unspecified atrial fibrillation: Secondary | ICD-10-CM | POA: Diagnosis not present

## 2018-11-21 DIAGNOSIS — I482 Chronic atrial fibrillation, unspecified: Secondary | ICD-10-CM | POA: Diagnosis not present

## 2018-11-21 DIAGNOSIS — Z952 Presence of prosthetic heart valve: Secondary | ICD-10-CM | POA: Diagnosis not present

## 2018-11-27 ENCOUNTER — Telehealth: Payer: Self-pay | Admitting: Cardiology

## 2018-11-27 NOTE — Telephone Encounter (Signed)
OK with switch ?

## 2018-11-27 NOTE — Telephone Encounter (Signed)
New Message   Patient would like to transfer her heart care from Dr. Curt Bears to Dr. Harrington Challenger. Patient states that she would prefer a female Doctor vs female Dr. Lazaro Arms you guys approve the transfer in care? Please let me know.

## 2018-11-28 NOTE — Telephone Encounter (Signed)
Ok with switch  

## 2018-11-30 ENCOUNTER — Telehealth: Payer: Self-pay

## 2018-11-30 ENCOUNTER — Telehealth: Payer: Self-pay | Admitting: Internal Medicine

## 2018-11-30 NOTE — Telephone Encounter (Signed)
Attempted to call patient for second time today. Pt did not answer and unable to leave a message.

## 2018-11-30 NOTE — Telephone Encounter (Signed)
New Message     Pt is calling to make an appt  She says she was peeing all night, too much. She says 6 times. She says her HR is low, she feels exhausted.  She also feels a lot of numbness. Down her right side and her legs.   Please call

## 2018-12-01 NOTE — Telephone Encounter (Signed)
Follow up    Patient is returning call. [phone] and [phone]

## 2018-12-01 NOTE — Telephone Encounter (Signed)
Attempted to call pt back... n/a on home phone and LM on pt cell phone. Will continue to try and reach her.

## 2018-12-01 NOTE — Telephone Encounter (Signed)
Received msg to call the pt back and the first number listed was her daughter-in-law and she asked me to call her husband at [phone]... I had to LM.... the other number on the msg is a wrong number.

## 2018-12-04 NOTE — Telephone Encounter (Signed)
Forwarding to Dr Alan Ripper nurse for follow up. Pt called 11/27/18 requesting only to see Dr Harrington Challenger for her heart care.  [place] and [employer] both agreeable and documented.

## 2018-12-05 ENCOUNTER — Telehealth: Payer: Self-pay | Admitting: Cardiology

## 2018-12-05 NOTE — Telephone Encounter (Signed)
Left messages on home and mobile numbers to call back.  If she cannot reach me to speak with she should make a sooner appt with APP when she calls.

## 2018-12-05 NOTE — Telephone Encounter (Addendum)
Left message on home and mobile numbers for patient to call back.  Called Lindell Spar, Son, (emergency contact).  He confirmed the numbers I tried are correct as far as he knows.  I adv I was trying to reach the patient, since she called our office today.  He will contact her and ask her to call us back.

## 2018-12-05 NOTE — Telephone Encounter (Signed)
Spoke with patient today and arranged sooner follow up.  See telephone encounter dated 12/05/18.

## 2018-12-05 NOTE — Telephone Encounter (Signed)
Called back to do pre visit screening with patient.  No answer at this time.

## 2018-12-05 NOTE — Telephone Encounter (Signed)
New Message ° ° °Patient returning your call. °

## 2018-12-05 NOTE — Telephone Encounter (Signed)
New message:     Patient stating that she needs a sooner appt patient is worried about her BP being so low and feels like she need a appt. Patient states that her BP machine is saying error Please call patient.

## 2018-12-05 NOTE — Telephone Encounter (Signed)
This am HR  42 BP 132/80  No energy.  Weak. Lost 5 pounds since got out of hospital.  Appetite is not great but she makes her self eat.  Drinks ensure, gatorade and water. Usually gets jerky feeling when feels afib.  Has not been feeling that.  Yesterday she did not take xarelto because she was so sick on her stomach and only had chicken broth for dinner. I adv that she needs to take with biggest meal, even if it is not that much, she should not miss any doses.    She is asking about taking metoprolol - adv it is not on her list, sotolol is.   She is not sure about that one.  Told her not to take the metoprolol or sotolol if HR is in 40s Her arms are skinny and her cuff reads error a lot of the time.   Pt is talking fast and gets off subject easily.  Hard to get direct answers. I moved her appointment up to Thurs with APP and reminded her to bring her medicine bottles and list of meds.

## 2018-12-07 ENCOUNTER — Encounter: Payer: Self-pay | Admitting: Cardiology

## 2018-12-07 ENCOUNTER — Encounter (INDEPENDENT_AMBULATORY_CARE_PROVIDER_SITE_OTHER): Payer: Self-pay

## 2018-12-07 ENCOUNTER — Other Ambulatory Visit: Payer: Self-pay

## 2018-12-07 ENCOUNTER — Telehealth (INDEPENDENT_AMBULATORY_CARE_PROVIDER_SITE_OTHER): Payer: PPO | Admitting: Cardiology

## 2018-12-07 VITALS — BP 130/64 | HR 92 | Ht 66.0 in | Wt 97.4 lb

## 2018-12-07 DIAGNOSIS — I493 Ventricular premature depolarization: Secondary | ICD-10-CM

## 2018-12-07 DIAGNOSIS — Z9889 Other specified postprocedural states: Secondary | ICD-10-CM | POA: Diagnosis not present

## 2018-12-07 DIAGNOSIS — I480 Paroxysmal atrial fibrillation: Secondary | ICD-10-CM | POA: Diagnosis not present

## 2018-12-07 DIAGNOSIS — I428 Other cardiomyopathies: Secondary | ICD-10-CM

## 2018-12-07 DIAGNOSIS — R001 Bradycardia, unspecified: Secondary | ICD-10-CM

## 2018-12-07 MED ORDER — CARVEDILOL 6.25 MG PO TABS: ORAL_TABLET | ORAL | 3 refills | Status: DC

## 2018-12-07 NOTE — Patient Instructions (Addendum)
Medication Instructions:  Your physician has recommended you make the following change in your medication:  1.  STOP Toprol 2.  TAKE 1/2 of the Carvedilol 6.25 at bed time   If you need a refill on your cardiac medications before your next appointment, please call your pharmacy.   Lab work: None ordered  If you have labs (blood work) drawn today and your tests are completely normal, you will receive your results only by: [name] MyChart Message (if you have MyChart) OR . A paper copy in the mail If you have any lab test that is abnormal or we need to change your treatment, we will call you to review the results.  Testing/Procedures: None ordered  Follow-Up: At [facility], you and your health needs are our priority.  As part of our continuing mission to provide you with exceptional heart care, we have created designated Provider Care Teams.  These Care Teams include your primary Cardiologist (physician) and Advanced Practice Providers (APPs -  Physician Assistants and Nurse Practitioners) who all work together to provide you with the care you need, when you need it. You will need a follow up appointment in: 12/11/2018 11:50 to see Dr. Harrington Challenger IN OFFICE  Any Other Special Instructions Will Be Listed Below (If Applicable).

## 2018-12-07 NOTE — Progress Notes (Signed)
Virtual Visit via Video Note   This visit type was conducted due to national recommendations for restrictions regarding the COVID-19 Pandemic (e.g. social distancing) in an effort to limit this patient's exposure and mitigate transmission in our community.  Due to her co-morbid illnesses, this patient is at least at moderate risk for complications without adequate follow up.  This format is felt to be most appropriate for this patient at this time.  All issues noted in this document were discussed and addressed.  A limited physical exam was performed with this format.  Please refer to the patient's chart for her consent to telehealth for [name].   Date:  12/07/2018   ID:  [name] [name], DOB 09-07-1940, MRN [id number]  Patient Location: Other:  office Provider Location: Home  PCP:  Geoffry Paradise, MD  Cardiologist:  Dietrich Pates, MD NEW  Electrophysiologist:  Will Jorja Loa, MD   Evaluation Performed:  Follow-Up Visit  Chief Complaint:  Weakness.   History of Present Illness:    [name] is a 78 y.o. female seen for bradycardia.   Hx of hyperlipidemia, hypertension, mitral valve repair and left atrial appendage clipping with maze in 2014.  Coronary angiogram May 2017 was normal.  She has a history of amiodarone induced thyroiditis.  She had an echo performed February 2019 with severe LV systolic dysfunction  EF 35-40%.  June 2017 showed no LV systolic dysfunction.  She was having 22% PVCs which were monomorphic.  She was put on Coreg which improved her symptoms. Hx of chronic systolic HF. Due to NICM possibly related to PVC burden. Per Dr. Elberta Fortis she has multiple morphologies of PVCs.  There is not one dominant morphology.  She certainly has an outflow tract morphology, but she also has a morphology with a right bundle branch pattern, potentially from the left ventricle.  She is certainly at high risk for ablation and thus I would prefer antiarrhythmic medications."       Had been referred to Dr. Elberta Fortis by Dr. Jacinto Halim, no longer is followed by Dr. Jacinto Halim I understand changed to Dr. Allena Katz in [facility].  With her March visit with Dr. Elberta Fortis she was in a fib and on betapace and coreg as above.  She did undergo successful DCCV  07/04/18.      She then had a couple of bouts of colitis.  In April she saw Dr. Allena Katz in [name] most recently hospitalized there for a fib.  She also complained of syncope/near syncope and her coreg changed to metoprolol 25 BID.  She called now to be seen wanting a female cardiologist.   She also complained of HR in 40s.      Today she is being seen for HR at 42, weak, near syncope.  On sotalol and xarelto CHA2DS2VASc of 3.  She is not taking metoprolol she has resumed coreg at 3.126 at night only.  She states if takes more freq she is too weak.   No chest pain and no SOB.  She notes she has lost wt, maybe from colitis  She is frustrated with the PVCs and intolerance to medicaitons.  She refuses amiodarone with her hx of thyroiditis.   Her low HR may be a radial pulse and freq PVCs vs pvcs making her feel weak, she last wore a monitor 09/19/18 with mostly single PVCs 25% some couplets occ SVT at 130, for 30 min. This was through Dr. Allena Katz  Echo done at that time  EF 50%, moderately dilated LA, mild to mod AR, MV repair with meagn gradient of 4 mmHg.  Mild MR.  With improvement of EF despite PVCs doubt PVC induced cardiomyopathy per Dr. Allena KatzGuzman.   The patient does not have symptoms concerning for COVID-19 infection (fever, chills, cough, or new shortness of breath).    Past Medical History:  Diagnosis Date   Acute coronary syndrome (HCC) 06/2015   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 Dx 04/2012;  b. Amio d/c'd 2/2 hyperthyroidism;  c. Atrial flutter 10/2012;  d. On xarelto.   Bigeminy    CHF (congestive heart failure)  (HCC)    Complication of anesthesia    Coronary artery disease    Diverticulitis    GERD (gastroesophageal reflux disease)    Goiter    Headache    Heart murmur    Hyperlipidemia    recently- taken off Crestor- due to pain in her legs, but this was after cardiac cath. , pt. questioning whether the pain in her legs was related to cath. or crestor   Hypertension    Then hypotension 04/2012 limiting med adjustment.   Intractable vomiting    Malnutrition of moderate degree (HCC)    Microscopic hematuria 08/20/2012   Mitral valve prolapse    a. H/o MVP, with severe MR 04/2012, had MV repair and Maze   Pneumonia    [facility] Feb[facility] 2014   PONV (postoperative nausea and vomiting)    Refusal of blood transfusions as patient is Jehovah's Witness    S/P Maze operation for atrial fibrillation 06/28/2012   Complete bilateral atrial lesion set using cryothermy ablation with oversewing of LA appendage via right minithoracotomy   S/P mitral valve repair 06/28/2012   Complex valvuloplasty including quadrangular resection of posterior leaflet, sliding leaflet plasty, artificial Gore-tex neocord placement x2 and 32mm Sorin Memo 3D ring annuloplasty via right mini thoracotomy    SBO (small bowel obstruction) (HCC)    Shortness of breath    Past Surgical History:  Procedure Laterality Date   CARDIAC CATHETERIZATION N/A 07/16/2015   Procedure: Left Heart Cath and Coronary Angiography;  Surgeon: Orpah CobbAjay Kadakia, MD;  [facility];  Service: Cardiovascular;  Laterality: N/A;   CARDIOVERSION N/A 07/04/2018   Procedure: CARDIOVERSION;  Surgeon: Elease HashimotoNahser, Deloris PingPhilip J, MD;  [facility];  Service: Cardiovascular;  Laterality: N/A;   CORONARY ARTERY BYPASS GRAFT     INTRAOPERATIVE TRANSESOPHAGEAL ECHOCARDIOGRAM N/A 06/28/2012   Procedure: INTRAOPERATIVE TRANSESOPHAGEAL ECHOCARDIOGRAM;  Surgeon: Purcell Nailslarence H Owen, MD;  [facility];  Service: Open Heart  Surgery;  Laterality: N/A;   LAPAROSCOPY N/A 05/21/2016   Procedure: LAPAROSCOPY DIAGNOSTIC, LYSIS OF ADHESION BAND;  Surgeon: Axel FillerArmando Ramirez, MD;  Location: [facility] OR;  Service: General;  Laterality: N/A;   LEFT AND RIGHT HEART CATHETERIZATION WITH CORONARY ANGIOGRAM N/A 05/22/2012   Procedure: LEFT AND RIGHT HEART CATHETERIZATION WITH CORONARY ANGIOGRAM;  Surgeon: Kathleene Hazelhristopher D McAlhany, MD;  [facility];  Service: Cardiovascular;  Laterality: N/A;   MINIMALLY INVASIVE MAZE PROCEDURE N/A 06/28/2012   Procedure: MINIMALLY INVASIVE MAZE PROCEDURE;  Surgeon: Purcell Nailslarence H Owen, MD;  Location: [facility] OR;  Service: Open Heart Surgery;  Laterality: N/A;   MITRAL VALVE REPAIR Right 06/28/2012   Procedure: MINIMALLY INVASIVE MITRAL VALVE REPAIR (MVR);  Surgeon: Purcell Nailslarence H Owen, MD;  [name];  Service: Open Heart Surgery;  Laterality: Right;   OVARIAN CYST SURGERY  1988   TEE WITHOUT CARDIOVERSION  05/19/2012   Procedure: TRANSESOPHAGEAL ECHOCARDIOGRAM (TEE);  Surgeon: Dolores Pattyaniel R Bensimhon, MD;  [facility];  Service: Cardiovascular;  Laterality: N/A;   TUBAL LIGATION  1974   URETHRAL STRICTURE DILATATION     UTERINE FIBROID SURGERY  1988   `       Current Meds  Medication Sig   ALPRAZolam (XANAX) 0.25 MG tablet Take 0.125-0.25 mg by mouth 2 (two) times daily as needed for anxiety.    cholecalciferol (VITAMIN D) 1000 units tablet Take 1,000 Units by mouth daily.    fluticasone (FLONASE) 50 MCG/ACT nasal spray Place 1 spray into both nostrils daily as needed for allergies.    Menthol, Topical Analgesic, (ICY HOT EX) Apply 1 application topically daily as needed (back pain).   methimazole (TAPAZOLE) 5 MG tablet Take 5 mg by mouth every morning.    pantoprazole (PROTONIX) 40 MG tablet Take 40 mg by mouth daily.    sotalol (BETAPACE) 80 MG tablet Take 1 tablet (80 mg total) by mouth 2 (two) times daily.   XARELTO 15 MG TABS tablet TAKE 1 TABLET BY MOUTH DAILY WITH SUPPER     [DISCONTINUED] carvedilol (COREG) 6.25 MG tablet Take 6.25 mg by mouth 2 (two) times daily with a meal.     Allergies:   Amiodarone, Diltiazem, Macrobid [nitrofurantoin monohyd macro], Mexiletine, Aspirin, Naproxen sodium, and Sulfa drugs cross reactors   Social History   Tobacco Use   Smoking status: Never Smoker   Smokeless tobacco: Never Used  Substance Use Topics   Alcohol use: No    Comment: 1 drink a month   Drug use: No     Family Hx: The patient's family history includes Colon cancer in her father.  ROS:   Please see the history of present illness.    General:no colds or fevers, no weight changes Skin:no rashes or ulcers HEENT:no blurred vision, no congestion CV:see HPI PUL:see HPI GI:no diarrhea constipation or melena, no indigestion GU:no hematuria, no dysuria MS:no joint pain, no claudication Neuro:no syncope, no lightheadedness Endo:no diabetes, + thyroid disease  All other systems reviewed and are negative.   Prior CV studies:   The following studies were reviewed today:  09/15/18 echo Technical Quality: Limited visualization Study Location: Echo Lab Indications: Mitral Valve Repair. Patient Status: Routine Height: 66 inches Weight: 99 pounds BSA: 1.48 m2 BMI: 15.98 kg/m2 HR: 43 bpm BP: 158/78 mmHg Conclusions Summary TDS. Mildly reduced LV systolic function with mild global LV hypokinesis. Ejection fraction is visually estimated at 50%. Indeterminate diastolic function. Moderately dilated left atrium. There is moderate aortic sclerosis noted, with no evidence of stenosis. Mild-to-moderate aortic regurgitation. Mitral valve repair with a mean gradient of 4mmHg. Mild mitral regurgitation. Signature  holter monitor 09/19/18 The patient underwent a 48 hours Holter monitoring. The data was appropriate for evaluation. The baseline rhythm was normal sinus rhythm. There was a first-degree AV block. The average heart rate was 83 bpm. The  minimum heart rate was 57 bpm at 1:50 AM. The maximum heart rate was 130 bpm at 5:15 AM. There were very infrequent supraventricular arrhythmias. There were frequent PVCs. Representing approximately 25% of the overall ventricular beats. Even though the vast majority were single PVCs including trigeminy and bigeminy, there was a fair amount of couplets and very few triplets. No forms of more advanced ventricular arrhythmias. There was a tachycardia event at  a rate of 130 bpm around 5 AM that lasted for around 30 minutes.  Conclusion Very frequent PVCs most of them single PVCs. (Approximately 25%) Episode of supraventricular tachycardia at the rate of 130 bpm for approximately 30 minutes No indications of conduction abnormalities  Result Narrative     Labs/Other Tests and Data Reviewed:    EKG:  An ECG dated 12/07/18 was personally reviewed today and demonstrated:  SR with trigemny mostly PVCs LAD, nonspecific ST abnormalties.  Recent Labs: 05/09/2018: B Natriuretic Peptide 141.0; TSH 2.231 07/11/2018: ALT 26; BUN 11; Creatinine, Ser 0.77; Hemoglobin 11.8; Platelets 214; Potassium 3.9; Sodium 134   Recent Lipid Panel Lab Results  Component Value Date/Time   CHOL 226 (H) 07/16/2015 05:57 AM   TRIG 74 07/16/2015 05:57 AM   HDL 45 07/16/2015 05:57 AM   CHOLHDL 5.0 07/16/2015 05:57 AM   LDLCALC 166 (H) 07/16/2015 05:57 AM    Wt Readings from Last 3 Encounters:  12/07/18 97 lb 6.4 oz (44.2 kg)  07/09/18 101 lb 12.8 oz (46.2 kg)  07/04/18 101 lb 12.8 oz (46.2 kg)     Objective:    Vital Signs:  BP 130/64    Pulse 92    Ht 5\' 6"  (1.676 m)    Wt 97 lb 6.4 oz (44.2 kg)    SpO2 98%    BMI 15.72 kg/m    VITAL SIGNS:  reviewed  General pleasant female in no acute distress Lungs, could speak in complete sentences without SOB or wheezes Neuro Alert and oriented X 3 follows commands With anxiety about medications and past side effects  ASSESSMENT & PLAN:    1. PAF on betapace and  xarelto - in SR today.  Also hx of SVT.   And possible bradycardia 2. PVCs on coreg was at 3.125 BID then changed to lopressor but she thought this made her worse.  Now on 3.125 at hs only.  She did not wish to increase dose.  Difficult situation, she is intolerant to many meds not sure her weakness and near syncope is overall health, PVCs or too much medication.  Per Dr. Curt Bears not a good candidate for PVC ablation.  She should be seen in person to eval and discuss options, she will see Dr. Harrington Challenger as soon as possible 3. NICM improved, now with continued PVCs the CM not thought related to PVCs 4. MVRepair. By Dr. Roxy Manns 2014 with Maze procedure.    Stable   COVID-19 Education: The signs and symptoms of COVID-19 were discussed with the patient and how to seek care for testing (follow up with PCP or arrange E-visit).  The importance of social distancing was discussed today.  Time:   Today, I have spent 20 minutes with the patient with telehealth technology discussing the above problems.     Medication Adjustments/Labs and Tests Ordered: Current medicines are reviewed at length with the patient today.  Concerns regarding medicines are outlined above.   Tests Ordered: Orders Placed This Encounter  Procedures   EKG 12-Lead    Medication Changes: Meds ordered this encounter  Medications   carvedilol (COREG) 6.25 MG tablet    Sig: Take 1 tablet by mouth twice a day and take 1/2 tablet by mouth at bedtime.    Dispense:  190 tablet    Refill:  3    Follow Up:  Virtual Visit in 1 week(s)  Signed, Cecilie Kicks, NP  12/07/2018 9:14 PM    [facility]

## 2018-12-10 NOTE — Progress Notes (Signed)
Cardiology Office Note   Date:  12/11/2018   ID:  [name] Bristleatricia A Lamia, DOB Aug 18, 1940, MRN [id number]  PCP:  Geoffry ParadiseAronson, Richard, MD  Cardiologist:   Dietrich PatesPaula Velta Rockholt, MD   F/U of PAF and HTN      History of Present Illness: [name] [name] is a 78 y.o. female with a history of of PVCs, PAF as well as atrial flutter (Rx amiodarone; s/p MAZE), HL, HTN.   She is s/p  MV repair with L atrial appendage cliipping and MAZE procedure in 2014   Cardiac catheterization in 2017 was normal   Echo in Feb 2019 showed severe LV dysfunction (new compared to echo from June 2017)  At the time she had 22% PVCs She was placed on Coreg (had thyrotoxicosis on amiodarone in past; did not tolerate mexilitene;)d  She was seen by Carleene MainsW Camnitz in March 2020 for PVCs   They were multipble morphologies  Felt high risk for ablation.  At tthe time of this visit she was in atrial flutter (on carvedilol and Xarelto)  Carvedilol was stopped and she was started on sotalol The pt has also [facility]  Hospitalized [facility] for afib   Echo LVEF 50%  Mild ot mod AR.  Mod LAE  Mild MR SHe was seen by Sherian ReinL Ingold on 8/13.20  At that visit said she wanted a female cardiologist ON day of visit with incgold HR was 42 (? Accurate given Hx PVCS)  Pt weak   On sotalol   This morning called EMS  BP 60/44    EMS came and said that was an error  No Breathing is OK    Not dizzy this morning   Rare dizzy Does feel heart racing daily    Current Meds  Medication Sig  . ALPRAZolam (XANAX) 0.25 MG tablet Take 0.125-0.25 mg by mouth 2 (two) times daily as needed for anxiety.   . carvedilol (COREG) 6.25 MG tablet Take 1 tablet by mouth twice a day and take 1/2 tablet by mouth at bedtime.  . cholecalciferol (VITAMIN D) 1000 units tablet Take 1,000 Units by mouth daily.   . fluticasone (FLONASE) 50 MCG/ACT nasal spray Place 1 spray into both nostrils daily as needed for allergies.   . Menthol, Topical Analgesic, (ICY HOT EX) Apply 1  application topically daily as needed (back pain).  . methimazole (TAPAZOLE) 5 MG tablet Take 5 mg by mouth every morning.   . pantoprazole (PROTONIX) 40 MG tablet Take 40 mg by mouth daily.   . sotalol (BETAPACE) 80 MG tablet Take 1 tablet (80 mg total) by mouth 2 (two) times daily.  Carlena Hurl. XARELTO 15 MG TABS tablet TAKE 1 TABLET BY MOUTH DAILY WITH SUPPER     Allergies:   Amiodarone, Diltiazem, Macrobid [nitrofurantoin monohyd macro], Mexiletine, Aspirin, Naproxen sodium, and Sulfa drugs cross reactors   Past Medical History:  Diagnosis Date  . Acute coronary syndrome (HCC) 06/2015  . 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 Anemia   . Anxiety   . Aortic stenosis 05/19/2012   trivial by TEE  . Atrial fibrillation (HCC)    a. Dx 04/2012;  b. Amio d/c'd 2/2 hyperthyroidism;  c. Atrial flutter 10/2012;  d. On xarelto.  [name]. Bigeminy   . CHF (congestive heart failure) (HCC)   . Complication of anesthesia   . Coronary artery disease   . Diverticulitis   . GERD (gastroesophageal reflux disease)   . Goiter   . Headache   .  Heart murmur   . Hyperlipidemia    recently- taken off Crestor- due to pain in her legs, but this was after cardiac cath. , pt. questioning whether the pain in her legs was related to cath. or crestor  . Hypertension    Then hypotension 04/2012 limiting med adjustment.  . Intractable vomiting   . Malnutrition of moderate degree (HCC)   . Microscopic hematuria 08/20/2012  . Mitral valve prolapse    a. H/o MVP, with severe MR 04/2012, had MV repair and Maze  . Pneumonia    treated /w antibiotic- [facility]- early Feb. 2014  . PONV (postoperative nausea and vomiting)   . Refusal of blood transfusions as patient is Jehovah's Witness   . S/P Maze operation for atrial fibrillation 06/28/2012   Complete bilateral atrial lesion set using cryothermy ablation with oversewing of LA appendage via right minithoracotomy  . S/P mitral valve repair 06/28/2012   Complex valvuloplasty including  quadrangular resection of posterior leaflet, sliding leaflet plasty, artificial Gore-tex neocord placement x2 and 18mm Sorin Memo 3D ring annuloplasty via right mini thoracotomy   . SBO (small bowel obstruction) (HCC)   . Shortness of breath     Past Surgical History:  Procedure Laterality Date  . CARDIAC CATHETERIZATION N/A 07/16/2015   Procedure: Left Heart Cath and Coronary Angiography;  Surgeon: Orpah Cobb, MD;  [facility]: [facility];  Service: Cardiovascular;  Laterality: N/A;  . CARDIOVERSION N/A 07/04/2018   Procedure: CARDIOVERSION;  Surgeon: [name], MD;  Location: [facility] ENDOSCOPY;  Service: Cardiovascular;  Laterality: N/A;  . CORONARY ARTERY BYPASS GRAFT    . INTRAOPERATIVE TRANSESOPHAGEAL ECHOCARDIOGRAM N/A 06/28/2012   Procedure: INTRAOPERATIVE TRANSESOPHAGEAL ECHOCARDIOGRAM;  Surgeon: Purcell Nails, MD;  Location: [facility] OR;  Service: Open Heart Surgery;  Laterality: N/A;  . LAPAROSCOPY N/A 05/21/2016   Procedure: LAPAROSCOPY DIAGNOSTIC, LYSIS OF ADHESION BAND;  Surgeon: Axel Filler, MD;  Location: [facility] OR;  Service: General;  Laterality: N/A;  . LEFT AND RIGHT HEART CATHETERIZATION WITH CORONARY ANGIOGRAM N/A 05/22/2012   Procedure: LEFT AND RIGHT HEART CATHETERIZATION WITH CORONARY ANGIOGRAM;  Surgeon: Kathleene Hazel, MD;  Location: [facility] CATH LAB;  Service: Cardiovascular;  Laterality: N/A;  . MINIMALLY INVASIVE MAZE PROCEDURE N/A 06/28/2012   Procedure: MINIMALLY INVASIVE MAZE PROCEDURE;  Surgeon: Purcell Nails, MD;  Location: [facility] OR;  Service: Open Heart Surgery;  Laterality: N/A;  . MITRAL VALVE REPAIR Right 06/28/2012   Procedure: MINIMALLY INVASIVE MITRAL VALVE REPAIR (MVR);  Surgeon: Purcell Nails, MD;  [facility] OR;  Service: Open Heart Surgery;  Laterality: Right;  . OVARIAN CYST SURGERY  1988  . TEE WITHOUT CARDIOVERSION  05/19/2012   Procedure: TRANSESOPHAGEAL ECHOCARDIOGRAM (TEE);  Surgeon: Dolores Patty, MD;  Location: [facility] ENDOSCOPY;  Service:  Cardiovascular;  Laterality: N/A;  . TUBAL LIGATION  1974  . URETHRAL STRICTURE DILATATION    . UTERINE FIBROID SURGERY  1988  . `       Social History:  The patient  reports that she has never smoked. She has never used smokeless tobacco. She reports that she does not drink alcohol or use drugs.   Family History:  The patient's family history includes Colon cancer in her father.    ROS:  Please see the history of present illness. All other systems are reviewed and  Negative to the above problem except as noted.    PHYSICAL EXAM: VS:  BP (!) 142/60   Pulse (!) 56   Ht 5'  6" (1.676 m)   Wt 97 lb 1.9 oz (44.1 kg)   SpO2 99%   BMI 15.68 kg/m   GEN: THin 78 yo, in no acute distress  HEENT: normal  Neck: no JVD, carotid bruits, or masses Cardiac: RRR; no murmurs, rubs, or gallops,no edema  Respiratory:  clear to auscultation bilaterally, normal work of breathing GI: soft, nontender, nondistended, + BS  No hepatomegaly  MS: no deformity Moving all extremities   Skin: warm and dry, no rash Neuro:  Strength and sensation are intact Psych: euthymic mood, full affect   EKG:  EKG is not ordered today.  Tests::  09/15/18 echo Technical Quality: Limited visualization Study Location: Echo Lab Indications: Mitral Valve Repair. Patient Status: Routine Height: 66 inches Weight: 99 pounds BSA: 1.48 m2 BMI: 15.98 kg/m2 HR: 43 bpm BP: 158/78 mmHg Conclusions Summary TDS. Mildly reduced LV systolic function with mild global LV hypokinesis. Ejection fraction is visually estimated at 50%. Indeterminate diastolic function. Moderately dilated left atrium. There is moderate aortic sclerosis noted, with no evidence of stenosis. Mild-to-moderate aortic regurgitation. Mitral valve repair with a mean gradient of 4mmHg. Mild mitral regurgitation. Signature  holter monitor 09/19/18    The patient underwent a 48 hours Holter monitoring. The data was appropriate for evaluation. The  baseline rhythm was normal sinus rhythm. There was a first-degree AV block. The average heart rate was 83 bpm. The minimum heart rate was 57 bpm at 1:50 AM. The maximum heart rate was 130 bpm at 5:15 AM. There were very infrequent supraventricular arrhythmias. There were frequent PVCs. Representing approximately 25% of the overall ventricular beats. Even though the vast majority were single PVCs including trigeminy and bigeminy, there was a fair amount of couplets and very few triplets. No forms of more advanced ventricular arrhythmias. There was a tachycardia event at a rate of 130 bpm around 5 AM that lasted for around 30 minutes.  Conclusion Very frequent PVCs most of them single PVCs. (Approximately 25%) Episode of supraventricular tachycardia at the rate of 130 bpm for approximately 30 minutes No indications of conduction abnormalities  Result Narrative      Lipid Panel    Component Value Date/Time   CHOL 226 (H) 07/16/2015 0557   TRIG 74 07/16/2015 0557   HDL 45 07/16/2015 0557   CHOLHDL 5.0 07/16/2015 0557   VLDL 15 07/16/2015 0557   LDLCALC 166 (H) 07/16/2015 0557      Wt Readings from Last 3 Encounters:  12/11/18 97 lb 1.9 oz (44.1 kg)  12/07/18 97 lb 6.4 oz (44.2 kg)  07/09/18 101 lb 12.8 oz (46.2 kg)      ASSESSMENT AND PLAN:  1   Hypotension.  I do not think this was an accurate reading   She was not dizzy   Known frequent PVCs is probably how it was missed      2  Hx PVCs  Signif burden on monitors   LVEF is low normal by echo   Will review with EP  3  Hx atrial fib   S/p mAZE procedure   Continue Xarelto and Betapace  4  S/p MV repair in 2014   Mild MR on echo  5  Hx NICM   Last LVEF 50%  Current medicines are reviewed at length with the patient today.  The patient does not have concerns regarding medicines.  Signed, Dietrich PatesPaula Aaliah Jorgenson, MD  12/11/2018 12:08 PM    [facility] [address], [place], [place]  [address] Phone: [phone]; Fax: [phone]

## 2018-12-11 ENCOUNTER — Ambulatory Visit: Payer: PPO | Admitting: Internal Medicine

## 2018-12-11 ENCOUNTER — Encounter: Payer: Self-pay | Admitting: Internal Medicine

## 2018-12-11 ENCOUNTER — Other Ambulatory Visit: Payer: Self-pay

## 2018-12-11 VITALS — BP 142/60 | HR 56 | Ht 66.0 in | Wt 97.1 lb

## 2018-12-11 DIAGNOSIS — I493 Ventricular premature depolarization: Secondary | ICD-10-CM | POA: Diagnosis not present

## 2018-12-11 NOTE — Patient Instructions (Signed)
Medication Instructions:  No changes If you need a refill on your cardiac medications before your next appointment, please call your pharmacy.   Lab work: TSH, BMET, CBC If you have labs (blood work) drawn today and your tests are completely normal, you will receive your results only by: [name] MyChart Message (if you have MyChart) OR . A paper copy in the mail If you have any lab test that is abnormal or we need to change your treatment, we will call you to review the results.  Testing/Procedures: none  Follow-Up: We will contact you re: follow up after Dr. Harrington Challenger speaks with one of the electrical doctors (electrophysiologist).  Any Other Special Instructions Will Be Listed Below (If Applicable).

## 2018-12-12 ENCOUNTER — Ambulatory Visit: Payer: PPO | Admitting: Physician Assistant

## 2018-12-12 LAB — CBC
Hematocrit: 37.5 % (ref 34.0–46.6)
Hemoglobin: 12.4 g/dL (ref 11.1–15.9)
MCH: 29.5 pg (ref 26.6–33.0)
MCHC: 33.1 g/dL (ref 31.5–35.7)
MCV: 89 fL (ref 79–97)
Platelets: 239 10*3/uL (ref 150–450)
RBC: 4.21 x10E6/uL (ref 3.77–5.28)
RDW: 13.5 % (ref 11.7–15.4)
WBC: 6.3 10*3/uL (ref 3.4–10.8)

## 2018-12-12 LAB — BASIC METABOLIC PANEL
BUN/Creatinine Ratio: 13 (ref 12–28)
BUN: 10 mg/dL (ref 8–27)
CO2: 24 mmol/L (ref 20–29)
Calcium: 10.4 mg/dL — ABNORMAL HIGH (ref 8.7–10.3)
Chloride: 99 mmol/L (ref 96–106)
Creatinine, Ser: 0.76 mg/dL (ref 0.57–1.00)
GFR calc Af Amer: 88 mL/min/{1.73_m2} (ref >59)
GFR calc non Af Amer: 76 mL/min/{1.73_m2} (ref >59)
Glucose: 84 mg/dL (ref 65–99)
Potassium: 4.4 mmol/L (ref 3.5–5.2)
Sodium: 138 mmol/L (ref 134–144)

## 2018-12-12 LAB — TSH: TSH: 1.87 u[IU]/mL (ref 0.450–4.500)

## 2018-12-15 ENCOUNTER — Telehealth: Payer: Self-pay | Admitting: Internal Medicine

## 2018-12-15 NOTE — Telephone Encounter (Signed)
Patient informed of lab results. Verbalizes understanding.

## 2018-12-15 NOTE — Telephone Encounter (Signed)
  Patient is returning call regarding results 

## 2018-12-22 ENCOUNTER — Telehealth: Payer: Self-pay | Admitting: Gastroenterology

## 2018-12-22 NOTE — Telephone Encounter (Signed)
Patient last seen in the office by Nicoletta Ba PA.

## 2018-12-22 NOTE — Telephone Encounter (Signed)
Spoke with the patient. She states she has not had a bowel movement in 2 days. She began having abdominal discomfort this morning, so she took MOM which is what she usually does. She developed severe abdominal pain and feels she is "doubled over" in pain. She goes to the bathroom but cannot produce a bowel movement. She sees "pus" in the toilet instead. States "it is nasty and pus and not stool." History of bowel obstruction and diverticulitis. Denies fever or vomiting. Agrees to go to the ER due to the severity of the pain.

## 2019-01-06 DIAGNOSIS — Z23 Encounter for immunization: Secondary | ICD-10-CM | POA: Diagnosis not present

## 2019-01-31 ENCOUNTER — Other Ambulatory Visit: Payer: Self-pay

## 2019-01-31 DIAGNOSIS — I83899 Varicose veins of unspecified lower extremities with other complications: Secondary | ICD-10-CM

## 2019-02-06 ENCOUNTER — Encounter: Payer: PPO | Admitting: Vascular Surgery

## 2019-02-06 ENCOUNTER — Ambulatory Visit (HOSPITAL_COMMUNITY): Admission: RE | Admit: 2019-02-06 | Payer: PPO | Source: Ambulatory Visit

## 2019-02-06 ENCOUNTER — Telehealth: Payer: Self-pay | Admitting: Internal Medicine

## 2019-02-06 NOTE — Telephone Encounter (Signed)
Pt reports that she is currently in the doughnut hole and cannot afford her Xarelto.  She also wants 30 day supply b/c the 90 is too expensive. She asked for a cheaper blood thinner and explained that Eliquis will probably cost about the same and the cheapest is Coumadin which she does NOT want to do.  Pt aware I will place Xarelto assistance paperwork, along with samples, downstairs for her to pick up.  Advised to complete paperwork ASAP and return w/ required personal documentation. Patient verbalized understanding and agreeable to plan. She will stop by the office this afternoon/tomorrow.   Also scheduled overdue follow up with Dr. Curt Bears. Patient verbalized understanding and agreeable to plan.

## 2019-02-06 NOTE — Telephone Encounter (Signed)
New message:    Patient calling concerning that her medication Xarelto is to high and would like to know if there is cheaper medication that she can get. Please call patient.

## 2019-02-09 ENCOUNTER — Telehealth: Payer: Self-pay | Admitting: Internal Medicine

## 2019-02-09 NOTE — Telephone Encounter (Signed)
Left messages on home and mobile numbers to call back. ?

## 2019-02-09 NOTE — Telephone Encounter (Signed)
New Message   Per [facility] would like nurse to call to go over medications and she did refill metoprolol.

## 2019-02-15 NOTE — Telephone Encounter (Signed)
Left message on vm to call back to review medications.

## 2019-02-16 NOTE — Telephone Encounter (Signed)
Patient returning call.

## 2019-02-16 NOTE — Telephone Encounter (Addendum)
Attempted to call [facility] back waited on call for 16 minutes.[name] [name] spoke with the pt and she reported that [facility] added metoprolol to her med list... she is seeing Dr. Curt Bears 02/20/19... I advised her to bring all meds with her to her appt... I advised her that it may not be good if they are making cardiac med changes since she has Dr. Curt Bears and Dr. Harrington Challenger.... she agreed... she is unclear why they started it and has been having a lot of fatigue with it. I advised her we will discuss fruther with Dr. Curt Bears at her appt.

## 2019-02-19 ENCOUNTER — Encounter: Payer: Self-pay | Admitting: Cardiology

## 2019-02-20 ENCOUNTER — Encounter: Payer: Self-pay | Admitting: Cardiology

## 2019-02-20 ENCOUNTER — Encounter (INDEPENDENT_AMBULATORY_CARE_PROVIDER_SITE_OTHER): Payer: Self-pay

## 2019-02-20 ENCOUNTER — Ambulatory Visit: Payer: PPO | Admitting: Cardiology

## 2019-02-20 ENCOUNTER — Other Ambulatory Visit: Payer: Self-pay

## 2019-02-20 VITALS — BP 128/64 | HR 112 | Ht 66.0 in | Wt 98.4 lb

## 2019-02-20 DIAGNOSIS — I484 Atypical atrial flutter: Secondary | ICD-10-CM | POA: Diagnosis not present

## 2019-02-20 MED ORDER — SOTALOL HCL 80 MG PO TABS: 40.0000 mg | ORAL_TABLET | Freq: Two times a day (BID) | ORAL | 3 refills | Status: DC

## 2019-02-20 NOTE — Patient Instructions (Addendum)
Medication Instructions:  Your physician has recommended you make the following change in your medication:  1. STOP Metoprolol 2. START Sotalol 40 mg twice a day  * If you need a refill on your cardiac medications before your next appointment, please call your pharmacy.   Labwork: None ordered  Testing/Procedures: None ordered  Follow-Up: You are scheduled for an EKG on 02/27/19 @ 1:45 pm  Thank you for choosing [facility]!!   Trinidad Curet, RN [phone]  Any Other Special Instructions Will Be Listed Below (If Applicable).

## 2019-02-20 NOTE — Progress Notes (Signed)
Electrophysiology Office Note   Date:  02/21/2019   ID:  [name] [name], DOB 1940-05-08, MRN [id number]  PCP:  Geoffry Paradise, MD  Cardiologist:  Jacinto Halim Primary Electrophysiologist:  Marenda Accardi Jorja Loa, MD    No chief complaint on file.    History of Present Illness: [name] [name] is a 78 y.o. female who is being seen today for the evaluation of PVCs at the request of [name] [name]. Presenting today for electrophysiology evaluation.  She has a history of hyperlipidemia, hypertension, mitral valve repair and left atrial appendage clipping with maze in 2014.  Coronary angiogram May 2017 was normal.  She has a history of amiodarone induced thyroiditis.  She had an echo performed February 2019 with severe LV systolic dysfunction.  June 2017 showed no LV systolic dysfunction.  She was having 22% PVCs which were monomorphic.  She was put on Coreg which improved her symptoms.  Today, denies symptoms of chest pain, shortness of breath, orthopnea, PND, lower extremity edema, claudication, dizziness, presyncope, syncope, bleeding, or neurologic sequela. The patient is tolerating medications without difficulties.  She does have some weakness and fatigue.  She is currently in atrial flutter.  It is unclear as to when she went into atrial flutter, but she appears to be in sinus rhythm back in August.  She also gets occasional palpitations.  Past Medical History:  Diagnosis Date  . Acute coronary syndrome (HCC) 06/2015  . 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 Anemia   . Anxiety   . Aortic stenosis 05/19/2012   trivial by TEE  . Atrial fibrillation (HCC)    a. Dx 04/2012;  b. Amio d/c'd 2/2 hyperthyroidism;  c. Atrial flutter 10/2012;  d. On xarelto.  [name] [name]   . CHF (congestive heart failure) (HCC)   . Complication of anesthesia   . Coronary artery disease   . Diverticulitis   . GERD (gastroesophageal reflux disease)   . Goiter   . Headache   . Heart murmur   .  Hyperlipidemia    recently- taken off Crestor- due to pain in her legs, but this was after cardiac cath. , pt. questioning whether the pain in her legs was related to cath. or crestor  . Hypertension    Then hypotension 04/2012 limiting med adjustment.  . Intractable vomiting   . Malnutrition of moderate degree (HCC)   . Microscopic hematuria 08/20/2012  . Mitral valve prolapse    a. H/o MVP, with severe MR 04/2012, had MV repair and Maze  . Pneumonia    treated /w antibiotic- [facility]- early Feb. 2014  . PONV (postoperative nausea and vomiting)   . Refusal of blood transfusions as patient is Jehovah's Witness   . S/P Maze operation for atrial fibrillation 06/28/2012   Complete bilateral atrial lesion set using cryothermy ablation with oversewing of LA appendage via right minithoracotomy  . S/P mitral valve repair 06/28/2012   Complex valvuloplasty including quadrangular resection of posterior leaflet, sliding leaflet plasty, artificial Gore-tex neocord placement x2 and 42mm Sorin Memo 3D ring annuloplasty via right mini thoracotomy   . SBO (small bowel obstruction) (HCC)   . Shortness of breath    Past Surgical History:  Procedure Laterality Date  . CARDIAC CATHETERIZATION N/A 07/16/2015   Procedure: Left Heart Cath and Coronary Angiography;  Surgeon: Orpah Cobb, MD;  Location: [facility] INVASIVE CV LAB;  Service: Cardiovascular;  Laterality: N/A;  . CARDIOVERSION N/A 07/04/2018   Procedure: CARDIOVERSION;  Surgeon: [name], MD;  Location: [facility] ENDOSCOPY;  Service: Cardiovascular;  Laterality: N/A;  . CORONARY ARTERY BYPASS GRAFT    . INTRAOPERATIVE TRANSESOPHAGEAL ECHOCARDIOGRAM N/A 06/28/2012   Procedure: INTRAOPERATIVE TRANSESOPHAGEAL ECHOCARDIOGRAM;  Surgeon: Purcell Nails, MD;  Location[facility] OR;  Service: Open Heart Surgery;  Laterality: N/A;  . LAPAROSCOPY N/A 05/21/2016   Procedure: LAPAROSCOPY DIAGNOSTIC, LYSIS OF ADHESION BAND;  Surgeon: Axel Filler, MD;  Location: [facility] OR;  Service:  General;  Laterality: N/A;  . LEFT AND RIGHT HEART CATHETERIZATION WITH CORONARY ANGIOGRAM N/A 05/22/2012   Procedure: LEFT AND RIGHT HEART CATHETERIZATION WITH CORONARY ANGIOGRAM;  Surgeon: Kathleene Hazel, MD;  Location: [facility] CATH LAB;  Service: Cardiovascular;  Laterality: N/A;  . MINIMALLY INVASIVE MAZE PROCEDURE N/A 06/28/2012   Procedure: MINIMALLY INVASIVE MAZE PROCEDURE;  Surgeon: Purcell Nails, MD;  Location: [facility] OR;  Service: Open Heart Surgery;  Laterality: N/A;  . MITRAL VALVE REPAIR Right 06/28/2012   Procedure: MINIMALLY INVASIVE MITRAL VALVE REPAIR (MVR);  Surgeon: Purcell Nails, MD;  Location: [facility] OR;  Service: Open Heart Surgery;  Laterality: Right;  . OVARIAN CYST SURGERY  1988  . TEE WITHOUT CARDIOVERSION  05/19/2012   Procedure: TRANSESOPHAGEAL ECHOCARDIOGRAM (TEE);  Surgeon: Dolores Patty, MD;  Location: [facility] ENDOSCOPY;  Service: Cardiovascular;  Laterality: N/A;  . TUBAL LIGATION  1974  . URETHRAL STRICTURE DILATATION    . UTERINE FIBROID SURGERY  1988  . `       Current Outpatient Medications  Medication Sig Dispense Refill  . ALPRAZolam (XANAX) 0.25 MG tablet Take 0.125-0.25 mg by mouth 2 (two) times daily as needed for anxiety.     . cholecalciferol (VITAMIN D) 1000 units tablet Take 1,000 Units by mouth daily.     . fluticasone (FLONASE) 50 MCG/ACT nasal spray Place 1 spray into both nostrils daily as needed for allergies.     . Menthol, Topical Analgesic, (ICY HOT EX) Apply 1 application topically daily as needed (back pain).    . methimazole (TAPAZOLE) 5 MG tablet Take 5 mg by mouth every morning.     . pantoprazole (PROTONIX) 40 MG tablet Take 40 mg by mouth daily.     Carlena Hurl 15 MG TABS tablet TAKE 1 TABLET BY MOUTH DAILY WITH SUPPER 90 tablet 1  . sotalol (BETAPACE) 80 MG tablet Take 0.5 tablets (40 mg total) by mouth 2 (two) times daily. 60 tablet 3   No current facility-administered medications for this visit.     Allergies:   Amiodarone,  Diltiazem, Macrobid [nitrofurantoin monohyd macro], Mexiletine, Aspirin, Naproxen sodium, and Sulfa drugs cross reactors   Social History:  The patient  reports that she has never smoked. She has never used smokeless tobacco. She reports that she does not drink alcohol or use drugs.   Family History:  The patient's family history includes Colon cancer in her father.    ROS:  Please see the history of present illness.   Otherwise, review of systems is positive for none.   All other systems are reviewed and negative.   PHYSICAL EXAM: VS:  BP 128/64   Pulse (!) 112   Ht 5\' 6"  (1.676 m)   Wt 98 lb 6.4 oz (44.6 kg)   SpO2 97%   BMI 15.88 kg/m  , BMI Body mass index is 15.88 kg/m. GEN: Well nourished, well developed, in no acute distress  HEENT: normal  Neck: no JVD, carotid bruits, or masses Cardiac: iRRR; no murmurs, rubs, or gallops,no edema  Respiratory:  clear  to auscultation bilaterally, normal work of breathing GI: soft, nontender, nondistended, + BS MS: no deformity or atrophy  Skin: warm and dry Neuro:  Strength and sensation are intact Psych: euthymic mood, full affect  EKG:  EKG is ordered today. Personal review of the ekg ordered shows atrial flutter, PVCs    Recent Labs: 05/09/2018: B Natriuretic Peptide 141.0 07/11/2018: ALT 26 12/11/2018: BUN 10; Creatinine, Ser 0.76; Hemoglobin 12.4; Platelets 239; Potassium 4.4; Sodium 138; TSH 1.870    Lipid Panel     Component Value Date/Time   CHOL 226 (H) 07/16/2015 0557   TRIG 74 07/16/2015 0557   HDL 45 07/16/2015 0557   CHOLHDL 5.0 07/16/2015 0557   VLDL 15 07/16/2015 0557   LDLCALC 166 (H) 07/16/2015 0557     Wt Readings from Last 3 Encounters:  02/20/19 98 lb 6.4 oz (44.6 kg)  12/11/18 97 lb 1.9 oz (44.1 kg)  12/07/18 97 lb 6.4 oz (44.2 kg)      Other studies Reviewed: Additional studies/ records that were reviewed today include: TTE 03/08/2018 Review of the above records today demonstrates:  Left  ventricular cavity normal size.  Mild concentric LVH.  EF 40%. Severely dilated left atrium Mild aortic regurgitation Mitral annuloplasty ring in place with severe calcification.  Mild to moderate mitral stenosis.  Mild mitral regurgitation.  ASSESSMENT AND PLAN:  1.  PVCs: Has a high burden of PVCs at upwards of 20%.  She was previously on sotalol but for some reason has been taken off.  On sotalol to see if she can tolerate the medication.  We Jadyn Brasher stop her metoprolol.  We Jatniel Verastegui load her as an outpatient.  2.  Chronic systolic heart failure due to nonischemic cardiomyopathy: Possibly related to her PVCs.  Planning to start sotalol.  3.  Paroxysmal atrial fibrillation: Unfortunately back in atrial flutter today.  She is status post maze and left atrial appendage clipping.  Her atrial flutter appears to be left atrial.  We Tensley Wery start sotalol and possibly cardiovert.  This patients CHA2DS2-VASc Score and unadjusted Ischemic Stroke Rate (% per year) is equal to 3.2 % stroke rate/year from a score of 3  Above score calculated as 1 point each if present [CHF, HTN, DM, Vascular=MI/PAD/Aortic Plaque, Age if 65-74, or Female] Above score calculated as 2 points each if present [Age > 75, or Stroke/TIA/TE]    Current medicines are reviewed at length with the patient today.   The patient does not have concerns regarding her medicines.  The following changes were made today: Stop metoprolol, start sotalol Labs/ tests ordered today include:  Orders Placed This Encounter  Procedures  . EKG 12-Lead   Disposition:   FU with Gulianna Hornsby 3 months  Signed, Nikki Rusnak Meredith Leeds, MD  02/21/2019 7:55 AM     [facility] [address] [place] [address] [phone] (office) [phone] (fax)

## 2019-02-21 ENCOUNTER — Telehealth: Payer: Self-pay | Admitting: Cardiology

## 2019-02-21 ENCOUNTER — Telehealth: Payer: Self-pay

## 2019-02-21 NOTE — Telephone Encounter (Signed)
The pts completed application (no documents) was left at the office. We completed the provider part of the application, Dr Curt Bears signed it, and we faxed it to [place] Pt Asst program.

## 2019-02-21 NOTE — Telephone Encounter (Signed)
Follow-up:  Patient calling back because she would really like to speak with Sherri about her medication. She has not taken her medication yet, but is waiting to take it until she hears from her nurse

## 2019-02-21 NOTE — Telephone Encounter (Signed)
New Message:    Pt said she saw Dr Curt Bears yesterday and have a question about her Sotalol.

## 2019-02-21 NOTE — Telephone Encounter (Signed)
Pt concerned b/c she is on a thyroid medication and was reading Sotalol information which states could affect her thyroid medication. Informed pt that thyroid drug interaction w/ Sotalol is not the same medication she is taking for her thyroid. She is agreeable to taking it now. She will call if issues begin after medication restart.

## 2019-02-27 ENCOUNTER — Ambulatory Visit (INDEPENDENT_AMBULATORY_CARE_PROVIDER_SITE_OTHER): Payer: PPO | Admitting: *Deleted

## 2019-02-27 ENCOUNTER — Other Ambulatory Visit: Payer: Self-pay

## 2019-02-27 VITALS — HR 107 | Wt 97.8 lb

## 2019-02-27 DIAGNOSIS — I493 Ventricular premature depolarization: Secondary | ICD-10-CM | POA: Diagnosis not present

## 2019-02-27 NOTE — Progress Notes (Signed)
1.  Reason for visit: EKG post Flecainide start  2.  Name of MD requesting visit:  Camnitz  3. H&P:  See epic  4.  ROS related to problem:  See epic  5.  Assessment and plan per MD:   EKG showing ST, HR 107. Dr. Curt Bears reviewed, no orders received. Pt advised to continue low-dose Sotalol

## 2019-02-27 NOTE — Patient Instructions (Signed)
Continue taking  Sotalol. Keep your currently scheduled follow up with Dr. Curt Bears next year.

## 2019-03-02 ENCOUNTER — Telehealth: Payer: Self-pay | Admitting: Cardiology

## 2019-03-02 NOTE — Telephone Encounter (Signed)
  Reports that patient is feeling weak, fatigued and lightheaded since being on the sotalol (BETAPACE) 80 MG tablet. Patient also says she feels depressed and like a "lump on a log". Patient wants to stop the medication and PA told her not to and suggested she call our office. Nira Conn would like [place] to follow up with patient because she has concerns she may stop the medication anyway.

## 2019-03-02 NOTE — Telephone Encounter (Signed)
Left message for pt to call back to discuss

## 2019-03-02 NOTE — Telephone Encounter (Signed)
Pt reporting she feels depressed and like a "lump on a log".  She states, "I do a chore and I have to sit down, I just have no energy".  Pt verbalized that she would like to go back on Lopressor. Will forward to [facility] for advisement. Pt made aware it may be beginning of next week before return call w/ advisement.

## 2019-03-08 NOTE — Telephone Encounter (Signed)
Informed pt that Dr. Curt Bears recommends that she remain on Sotalol to try and correct the rhythm.  Explained that he wants her to establish in the [facility] clinic and have them arrange DCCV. Although hesitant, pt is agreeable to stopping the Lopressor (she restarted over weekend)

## 2019-03-12 ENCOUNTER — Emergency Department (HOSPITAL_COMMUNITY): Payer: PPO

## 2019-03-12 ENCOUNTER — Other Ambulatory Visit: Payer: Self-pay

## 2019-03-12 ENCOUNTER — Emergency Department (HOSPITAL_COMMUNITY)
Admission: EM | Admit: 2019-03-12 | Discharge: 2019-03-12 | Disposition: A | Discharge status: Home / Self Care | Payer: PPO | Attending: Emergency Medicine | Admitting: Emergency Medicine

## 2019-03-12 ENCOUNTER — Encounter (HOSPITAL_COMMUNITY): Payer: Self-pay | Admitting: Emergency Medicine

## 2019-03-12 DIAGNOSIS — Z79899 Other long term (current) drug therapy: Secondary | ICD-10-CM | POA: Diagnosis not present

## 2019-03-12 DIAGNOSIS — R000 Tachycardia, unspecified: Secondary | ICD-10-CM | POA: Diagnosis not present

## 2019-03-12 DIAGNOSIS — I509 Heart failure, unspecified: Secondary | ICD-10-CM | POA: Diagnosis not present

## 2019-03-12 DIAGNOSIS — K529 Noninfective gastroenteritis and colitis, unspecified: Secondary | ICD-10-CM | POA: Insufficient documentation

## 2019-03-12 DIAGNOSIS — Z951 Presence of aortocoronary bypass graft: Secondary | ICD-10-CM | POA: Diagnosis not present

## 2019-03-12 DIAGNOSIS — I110 Hypertensive heart disease with heart failure: Secondary | ICD-10-CM | POA: Insufficient documentation

## 2019-03-12 DIAGNOSIS — I2510 Atherosclerotic heart disease of native coronary artery without angina pectoris: Secondary | ICD-10-CM | POA: Insufficient documentation

## 2019-03-12 DIAGNOSIS — R1084 Generalized abdominal pain: Secondary | ICD-10-CM | POA: Diagnosis not present

## 2019-03-12 DIAGNOSIS — Z7901 Long term (current) use of anticoagulants: Secondary | ICD-10-CM | POA: Diagnosis not present

## 2019-03-12 DIAGNOSIS — R109 Unspecified abdominal pain: Secondary | ICD-10-CM | POA: Diagnosis not present

## 2019-03-12 DIAGNOSIS — K5900 Constipation, unspecified: Secondary | ICD-10-CM | POA: Diagnosis not present

## 2019-03-12 DIAGNOSIS — R110 Nausea: Secondary | ICD-10-CM | POA: Diagnosis not present

## 2019-03-12 DIAGNOSIS — R52 Pain, unspecified: Secondary | ICD-10-CM | POA: Diagnosis not present

## 2019-03-12 LAB — CBC WITH DIFFERENTIAL/PLATELET
Abs Immature Granulocytes: 0.04 10*3/uL (ref 0.00–0.07)
Basophils Absolute: 0.1 10*3/uL (ref 0.0–0.1)
Basophils Relative: 0 %
Eosinophils Absolute: 0 10*3/uL (ref 0.0–0.5)
Eosinophils Relative: 0 %
HCT: 43.8 % (ref 36.0–46.0)
Hemoglobin: 13.6 g/dL (ref 12.0–15.0)
Immature Granulocytes: 0 %
Lymphocytes Relative: 15 %
Lymphs Abs: 1.8 10*3/uL (ref 0.7–4.0)
MCH: 30 pg (ref 26.0–34.0)
MCHC: 31.1 g/dL (ref 30.0–36.0)
MCV: 96.7 fL (ref 80.0–100.0)
Monocytes Absolute: 0.7 10*3/uL (ref 0.1–1.0)
Monocytes Relative: 6 %
Neutro Abs: 9.2 10*3/uL — ABNORMAL HIGH (ref 1.7–7.7)
Neutrophils Relative %: 79 %
Platelets: 216 10*3/uL (ref 150–400)
RBC: 4.53 MIL/uL (ref 3.87–5.11)
RDW: 13.3 % (ref 11.5–15.5)
WBC: 11.9 10*3/uL — ABNORMAL HIGH (ref 4.0–10.5)
nRBC: 0 % (ref 0.0–0.2)

## 2019-03-12 LAB — LIPASE, BLOOD: Lipase: 31 U/L (ref 11–51)

## 2019-03-12 LAB — COMPREHENSIVE METABOLIC PANEL
ALT: 42 U/L (ref 0–44)
AST: 60 U/L — ABNORMAL HIGH (ref 15–41)
Albumin: 3.9 g/dL (ref 3.5–5.0)
Alkaline Phosphatase: 106 U/L (ref 38–126)
Anion gap: 8 (ref 5–15)
BUN: 14 mg/dL (ref 8–23)
CO2: 21 mmol/L — ABNORMAL LOW (ref 22–32)
Calcium: 9.7 mg/dL (ref 8.9–10.3)
Chloride: 107 mmol/L (ref 98–111)
Creatinine, Ser: 0.74 mg/dL (ref 0.44–1.00)
GFR calc Af Amer: >60 mL/min (ref >60)
GFR calc non Af Amer: >60 mL/min (ref >60)
Glucose, Bld: 93 mg/dL (ref 70–99)
Potassium: 4.4 mmol/L (ref 3.5–5.1)
Sodium: 136 mmol/L (ref 135–145)
Total Bilirubin: 0.7 mg/dL (ref 0.3–1.2)
Total Protein: 7 g/dL (ref 6.5–8.1)

## 2019-03-12 MED ORDER — SODIUM CHLORIDE 0.9 % IV SOLN: INTRAVENOUS | Status: DC

## 2019-03-12 MED ORDER — IOHEXOL 300 MG/ML  SOLN
100.0000 mL | Freq: Once | INTRAMUSCULAR | Status: AC | PRN
  Administered 2019-03-12: 100 mL via INTRAVENOUS

## 2019-03-12 MED ORDER — ONDANSETRON HCL 4 MG/2ML IJ SOLN
4.0000 mg | Freq: Once | INTRAMUSCULAR | Status: AC
  Administered 2019-03-12: 4 mg via INTRAVENOUS
  Filled 2019-03-12: qty 2

## 2019-03-12 MED ORDER — SODIUM CHLORIDE 0.9 % IV BOLUS
500.0000 mL | Freq: Once | INTRAVENOUS | Status: AC
  Administered 2019-03-12: 500 mL via INTRAVENOUS

## 2019-03-12 MED ORDER — MILK AND MOLASSES ENEMA
1.0000 | Freq: Once | RECTAL | Status: AC
  Administered 2019-03-12: 240 mL via RECTAL
  Filled 2019-03-12: qty 240

## 2019-03-12 MED ORDER — CIPROFLOXACIN HCL 500 MG PO TABS: 500.0000 mg | ORAL_TABLET | Freq: Two times a day (BID) | ORAL | 0 refills | Status: DC

## 2019-03-12 MED ORDER — METRONIDAZOLE 500 MG PO TABS
250.0000 mg | ORAL_TABLET | Freq: Once | ORAL | Status: AC
  Administered 2019-03-12: 250 mg via ORAL
  Filled 2019-03-12: qty 1

## 2019-03-12 MED ORDER — CIPROFLOXACIN IN D5W 400 MG/200ML IV SOLN
400.0000 mg | Freq: Two times a day (BID) | INTRAVENOUS | Status: DC
  Administered 2019-03-12: 400 mg via INTRAVENOUS
  Filled 2019-03-12: qty 200

## 2019-03-12 MED ORDER — METRONIDAZOLE 500 MG PO TABS: 500.0000 mg | ORAL_TABLET | Freq: Two times a day (BID) | ORAL | 0 refills | Status: DC

## 2019-03-12 MED ORDER — SODIUM CHLORIDE (PF) 0.9 % IJ SOLN
INTRAMUSCULAR | Status: AC
  Filled 2019-03-12: qty 50

## 2019-03-12 MED ORDER — MORPHINE SULFATE (PF) 4 MG/ML IV SOLN
4.0000 mg | Freq: Once | INTRAVENOUS | Status: DC
  Filled 2019-03-12 (×2): qty 1

## 2019-03-12 MED ORDER — TRAMADOL HCL 50 MG PO TABS
50.0000 mg | ORAL_TABLET | Freq: Once | ORAL | Status: AC
  Administered 2019-03-12: 50 mg via ORAL
  Filled 2019-03-12: qty 1

## 2019-03-12 NOTE — ED Triage Notes (Signed)
Per [facility] pt from home for abd pains for couple days, with nausea and constipation. Hx bowel blockage.

## 2019-03-12 NOTE — ED Notes (Signed)
ED Provider at bedside. 

## 2019-03-12 NOTE — Discharge Instructions (Signed)
Take the antibiotics as prescribed, return to the emergency room for fever, worsening symptoms.  Follow-up with your doctor to be rechecked in the next week.

## 2019-03-12 NOTE — ED Notes (Signed)
Patient transported to X-ray 

## 2019-03-12 NOTE — ED Notes (Signed)
Stuck pt for labs, wasn't able to get enough for lab work ordered.

## 2019-03-12 NOTE — ED Provider Notes (Signed)
[facility]-EMERGENCY DEPT Provider Note   CSN: [id number] Arrival date & time: 03/12/19  0830     History   Chief Complaint Chief Complaint  Patient presents with  . Abdominal Pain  . Nausea  . Constipation    HPI [name] [name] is a 78 y.o. female.     HPI Pt presents with complaints of constipation.  Pt has been straining without relief.  She is now having pain in her back and her side.  She also started to feel nauseated.   She had a last bowel movement on Friday.  She has been taking some tablets but does not remember the name.  She used to take milk of magnesia but could not take it because of her medications.   Pt called ems today hoping they would give her an enema.  No fevers.  No vomiting.  She is feeling weak. Past Medical History:  Diagnosis Date  . Acute coronary syndrome (HCC) 06/2015  . 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 Anemia   . Anxiety   . Aortic stenosis 05/19/2012   trivial by TEE  . Atrial fibrillation (HCC)    a. Dx 04/2012;  b. Amio d/c'd 2/2 hyperthyroidism;  c. Atrial flutter 10/2012;  d. On xarelto.  [name]   . CHF (congestive heart failure) (HCC)   . Complication of anesthesia   . Coronary artery disease   . Diverticulitis   . GERD (gastroesophageal reflux disease)   . Goiter   . Headache   . Heart murmur   . Hyperlipidemia    recently- taken off Crestor- due to pain in her legs, but this was after cardiac cath. , pt. questioning whether the pain in her legs was related to cath. or crestor  . Hypertension    Then hypotension 04/2012 limiting med adjustment.  . Intractable vomiting   . Malnutrition of moderate degree (HCC)   . Microscopic hematuria 08/20/2012  . Mitral valve prolapse    a. H/o MVP, with severe MR 04/2012, had MV repair and Maze  . Pneumonia    treated /w antibiotic- [facility]- early Feb. 2014  . PONV (postoperative nausea and vomiting)   . Refusal of blood transfusions as patient is Jehovah's  Witness   . S/P Maze operation for atrial fibrillation 06/28/2012   Complete bilateral atrial lesion set using cryothermy ablation with oversewing of LA appendage via right minithoracotomy  . S/P mitral valve repair 06/28/2012   Complex valvuloplasty including quadrangular resection of posterior leaflet, sliding leaflet plasty, artificial Gore-tex neocord placement x2 and 37mm Sorin Memo 3D ring annuloplasty via right mini thoracotomy   . SBO (small bowel obstruction) (HCC)   . Shortness of breath     Patient Active Problem List   Diagnosis Date Noted  . Malnutrition of moderate degree 05/17/2016  . Bigeminy   . Intractable vomiting 05/15/2016  . Heart palpitations 05/15/2016  . SBO (small bowel obstruction) (HCC) 05/15/2016  . Acute coronary syndrome (HCC) 07/15/2015  . Status post Maze operation for atrial fibrillation 01/08/2013  . S/P MVR (mitral valve repair) 01/08/2013  . Atrial flutter (HCC) 10/30/2012  . Amaurosis fugax 10/30/2012  . Dyspnea 10/28/2012  . 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 Thyroid goiter 08/21/2012  . Microscopic hematuria 08/20/2012  . S/P mitral valve repair 06/28/2012  . History of mitral valve prolapse with severe mitral regurgtation 05/19/2012  . Refusal of blood transfusions as patient is Jehovah's Witness 05/19/2012  . Atrial fibrillation (HCC) 05/16/2012  .  Anxiety 04/27/2011  . Hypertension 04/26/2011  . Diverticulitis 04/24/2011  . Hyperlipidemia     Past Surgical History:  Procedure Laterality Date  . CARDIAC CATHETERIZATION N/A 07/16/2015   Procedure: Left Heart Cath and Coronary Angiography;  Surgeon: Orpah Cobb, MD;  Location: [facility] INVASIVE CV LAB;  Service: Cardiovascular;  Laterality: N/A;  . CARDIOVERSION N/A 07/04/2018   Procedure: CARDIOVERSION;  Surgeon: [name], MD;  Location[facility] ENDOSCOPY;  Service: Cardiovascular;  Laterality: N/A;  . CORONARY ARTERY BYPASS GRAFT    . INTRAOPERATIVE TRANSESOPHAGEAL  ECHOCARDIOGRAM N/A 06/28/2012   Procedure: INTRAOPERATIVE TRANSESOPHAGEAL ECHOCARDIOGRAM;  Surgeon: Purcell Nails, MD;  Location: [facility] OR;  Service: Open Heart Surgery;  Laterality: N/A;  . LAPAROSCOPY N/A 05/21/2016   Procedure: LAPAROSCOPY DIAGNOSTIC, LYSIS OF ADHESION BAND;  Surgeon: Axel Filler, MD;  Location: [facility] OR;  Service: General;  Laterality: N/A;  . LEFT AND RIGHT HEART CATHETERIZATION WITH CORONARY ANGIOGRAM N/A 05/22/2012   Procedure: LEFT AND RIGHT HEART CATHETERIZATION WITH CORONARY ANGIOGRAM;  Surgeon: Kathleene Hazel, MD;  Location: [facility] CATH LAB;  Service: Cardiovascular;  Laterality: N/A;  . MINIMALLY INVASIVE MAZE PROCEDURE N/A 06/28/2012   Procedure: MINIMALLY INVASIVE MAZE PROCEDURE;  Surgeon: Purcell Nails, MD;  Location: [facility] OR;  Service: Open Heart Surgery;  Laterality: N/A;  . MITRAL VALVE REPAIR Right 06/28/2012   Procedure: MINIMALLY INVASIVE MITRAL VALVE REPAIR (MVR);  Surgeon: Purcell Nails, MD;  Location: [facility] OR;  Service: Open Heart Surgery;  Laterality: Right;  . OVARIAN CYST SURGERY  1988  . TEE WITHOUT CARDIOVERSION  05/19/2012   Procedure: TRANSESOPHAGEAL ECHOCARDIOGRAM (TEE);  Surgeon: Dolores Patty, MD;  Location: [facility] ENDOSCOPY;  Service: Cardiovascular;  Laterality: N/A;  . TUBAL LIGATION  1974  . URETHRAL STRICTURE DILATATION    . UTERINE FIBROID SURGERY  1988  . `       OB History   No obstetric history on file.      Home Medications    Prior to Admission medications   Medication Sig Start Date End Date Taking? Authorizing Provider  ALPRAZolam (XANAX) 0.25 MG tablet Take 0.125-0.25 mg by mouth 2 (two) times daily as needed for anxiety.     [provider]  cholecalciferol (VITAMIN D) 1000 units tablet Take 1,000 Units by mouth daily.     [provider]  ciprofloxacin (CIPRO) 500 MG tablet Take 1 tablet (500 mg total) by mouth 2 (two) times daily. 03/12/19   Linwood Dibbles, MD  fluticasone (FLONASE) 50 MCG/ACT nasal spray  Place 1 spray into both nostrils daily as needed for allergies.  12/08/12   [provider]  Menthol, Topical Analgesic, (ICY HOT EX) Apply 1 application topically daily as needed (back pain).    [provider]  methimazole (TAPAZOLE) 5 MG tablet Take 5 mg by mouth every morning.     [provider]  metroNIDAZOLE (FLAGYL) 500 MG tablet Take 1 tablet (500 mg total) by mouth 2 (two) times daily. 03/12/19   Linwood Dibbles, MD  pantoprazole (PROTONIX) 40 MG tablet Take 40 mg by mouth daily.  08/24/12   Geoffry Paradise, MD  sotalol (BETAPACE) 80 MG tablet Take 0.5 tablets (40 mg total) by mouth 2 (two) times daily. 02/20/19   Camnitz, Will Daphine Deutscher, MD  XARELTO 15 MG TABS tablet TAKE 1 TABLET BY MOUTH DAILY WITH SUPPER 11/07/18   Camnitz, Andree Coss, MD    Family History Family History  Problem Relation Age of Onset  . Colon cancer  Father     Social History Social History   Tobacco Use  . Smoking status: Never Smoker  . Smokeless tobacco: Never Used  Substance Use Topics  . Alcohol use: No    Comment: 1 drink a month  . Drug use: No     Allergies   Amiodarone, Diltiazem, Macrobid [nitrofurantoin monohyd macro], Mexiletine, Aspirin, Naproxen sodium, and Sulfa drugs cross reactors   Review of Systems Review of Systems  All other systems reviewed and are negative.    Physical Exam Updated Vital Signs BP (!) 126/92   Pulse (!) 107   Temp 97.6 F (36.4 C) (Oral)   Resp 18   Ht 1.676 m (5\' 6" )   Wt 43.1 kg   SpO2 100%   BMI 15.33 kg/m   Physical Exam Vitals signs and nursing note reviewed.  Constitutional:      General: She is not in acute distress.    Appearance: She is well-developed.  HENT:     Head: Normocephalic and atraumatic.     Right Ear: External ear normal.     Left Ear: External ear normal.  Eyes:     General: No scleral icterus.       Right eye: No discharge.        Left eye: No discharge.     Conjunctiva/sclera: Conjunctivae  normal.  Neck:     Musculoskeletal: Neck supple.     Trachea: No tracheal deviation.  Cardiovascular:     Rate and Rhythm: Normal rate and regular rhythm.  Pulmonary:     Effort: Pulmonary effort is normal. No respiratory distress.     Breath sounds: Normal breath sounds. No stridor. No wheezing or rales.  Abdominal:     General: Bowel sounds are normal. There is no distension.     Palpations: Abdomen is soft.     Tenderness: There is generalized abdominal tenderness. There is no guarding or rebound.  Genitourinary:    Comments: Small amount of firm stool palpated in the rectal vault but no impaction appreciated Musculoskeletal:        General: No tenderness.  Skin:    General: Skin is warm and dry.     Findings: No rash.  Neurological:     Mental Status: She is alert.     Cranial Nerves: No cranial nerve deficit (no facial droop, extraocular movements intact, no slurred speech).     Sensory: No sensory deficit.     Motor: No abnormal muscle tone or seizure activity.     Coordination: Coordination normal.      ED Treatments / Results  Labs (all labs ordered are listed, but only abnormal results are displayed) Labs Reviewed  COMPREHENSIVE METABOLIC PANEL - Abnormal; Notable for the following components:      Result Value   CO2 21 (*)    AST 60 (*)    All other components within normal limits  CBC WITH DIFFERENTIAL/PLATELET - Abnormal; Notable for the following components:   WBC 11.9 (*)    Neutro Abs 9.2 (*)    All other components within normal limits  LIPASE, BLOOD  URINALYSIS, ROUTINE W REFLEX MICROSCOPIC    EKG None  Radiology Ct Abdomen Pelvis W Contrast  Result Date: 03/12/2019 CLINICAL DATA:  Abdominal pain, nausea, constipation EXAM: CT ABDOMEN AND PELVIS WITH CONTRAST TECHNIQUE: Multidetector CT imaging of the abdomen and pelvis was performed using the standard protocol following bolus administration of intravenous contrast. CONTRAST:  100mL OMNIPAQUE  IOHEXOL 300 MG/ML  SOLN COMPARISON:  07/11/2018 FINDINGS: Lower chest: Trace right pleural effusion. Hepatobiliary: No focal liver abnormality is seen. No gallstones, gallbladder wall thickening, or biliary dilatation. Pancreas: Unremarkable. No pancreatic ductal dilatation or surrounding inflammatory changes. Spleen: Normal in size without focal abnormality. Adrenals/Urinary Tract: Normal adrenal glands. 10 mm left inferior pole renal cyst. No urolithiasis or obstructive uropathy. Normal bladder. Stomach/Bowel: Stomach is within normal limits. Mild colonic wall thickening involving the descending and sigmoid colon with mucosal enhancement most concerning for colitis secondary to an infectious or inflammatory etiology. No pneumatosis, pneumoperitoneum or portal venous gas. Scattered diverticulosis. Vascular/Lymphatic: Normal caliber abdominal aorta with mild atherosclerosis. No lymphadenopathy. Reproductive: Uterus and bilateral adnexa are unremarkable. Other: No abdominal wall hernia or abnormality. Trace pelvic free fluid. Musculoskeletal: No acute osseous abnormality. Chronic L3 compression fracture. No aggressive osseous lesion. Mild osteoarthritis of bilateral hips. Generalized osteopenia. IMPRESSION: 1. Mild colonic wall thickening involving the descending and sigmoid colon with mucosal enhancement most concerning for colitis secondary to an infectious or inflammatory etiology. 2.  Aortic Atherosclerosis (ICD10-I70.0). 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 Normal heart size. There is a mitral annuloplasty. No edema, effusion, or pneumothorax. Normal bowel gas pattern. No concerning mass effect or calcification.  Osteopenic appearance.  Chronic superior endplate fracture at L3. IMPRESSION: 1. Normal bowel gas pattern.  No abnormal stool retention. 2. No acute process in the chest. 3.  Known goiter with tracheal mass effect. Electronically Signed   By: Monte Fantasia M.D.   On: 03/12/2019 10:21    Procedures Procedures (including critical care time)  Medications Ordered in ED Medications  sodium chloride 0.9 % bolus 500 mL (500 mLs Intravenous New Bag/Given (Non-Interop) 03/12/19 0916)    And  0.9 %  sodium chloride infusion (has no administration in time range)  morphine 4 MG/ML injection 4 mg (4 mg Intravenous Refused 03/12/19 1155)  sodium chloride (PF) 0.9 % injection (has no administration in time range)  ciprofloxacin (CIPRO) IVPB 400 mg (400 mg Intravenous New Bag/Given (Non-Interop) 03/12/19 1342)  ondansetron (ZOFRAN) injection 4 mg (4 mg Intravenous Given 03/12/19 0916)  milk and molasses enema (240 mLs Rectal Given 03/12/19 1037)  iohexol (OMNIPAQUE) 300 MG/ML solution 100 mL (100 mLs Intravenous Contrast Given 03/12/19 1208)  metroNIDAZOLE (FLAGYL) tablet 250 mg (250 mg Oral Given 03/12/19 1340)  traMADol (ULTRAM) tablet 50 mg (50 mg Oral Given 03/12/19 1341)     Initial Impression / Assessment and Plan / ED Course  I have reviewed the triage vital signs and the nursing notes.  Pertinent labs & imaging results that were available during my care of the patient were reviewed by me and considered in my medical decision making (see chart for details).  Clinical Course as of Mar 11 1430  Mon Mar 12, 2019  1109 Elevated white blood cell count noted.   [JK]  1109 Abdominal x-rays do not show constipation or obstruction.  Patient's abdominal pain is concerning.  We will proceed with CT scan.   [JK]    Clinical Course User Index [JK] Dorie Rank, MD     Patient presented to the ED for evaluation abdominal pain and concerns of constipation.  Patient was given an enema in  the ED and she  felt somewhat better after that however x-rays did not show significant constipation and she did have an elevated white blood cell count.  CT scan was performed and it does demonstrate findings consistent with colitis.  I suspect this is infectious rather than ischemic in etiology.  I discussed admission to the hospital and IV antibiotics with the patient.  Patient states she is feeling better and does not want to be admitted.  She would rather go home.  I will discharge her home on a course of Cipro and Flagyl.  She understands to return to the emergency room for worsening symptoms.  Final Clinical Impressions(s) / ED Diagnoses   Final diagnoses:  Colitis    ED Discharge Orders         Ordered    ciprofloxacin (CIPRO) 500 MG tablet  2 times daily     03/12/19 1429    metroNIDAZOLE (FLAGYL) 500 MG tablet  2 times daily     03/12/19 1429           Linwood DibblesKnapp, Jaicob Dia, MD 03/12/19 1431

## 2019-03-15 DIAGNOSIS — L239 Allergic contact dermatitis, unspecified cause: Secondary | ICD-10-CM | POA: Diagnosis not present

## 2019-03-15 DIAGNOSIS — K529 Noninfective gastroenteritis and colitis, unspecified: Secondary | ICD-10-CM | POA: Diagnosis not present

## 2019-03-15 DIAGNOSIS — I480 Paroxysmal atrial fibrillation: Secondary | ICD-10-CM | POA: Diagnosis not present

## 2019-03-16 ENCOUNTER — Telehealth: Payer: Self-pay

## 2019-03-16 ENCOUNTER — Telehealth: Payer: Self-pay | Admitting: Cardiology

## 2019-03-16 ENCOUNTER — Other Ambulatory Visit: Payer: Self-pay | Admitting: Cardiology

## 2019-03-16 MED ORDER — RIVAROXABAN 15 MG PO TABS: 15.0000 mg | ORAL_TABLET | Freq: Every day | ORAL | 1 refills | Status: DC

## 2019-03-16 NOTE — Telephone Encounter (Signed)
Xarelto Rx card mailed to the pt.

## 2019-03-16 NOTE — Telephone Encounter (Signed)
New message:    Patient just got out the hospital and she is out of medication and she would like for someone to call her. I also sent a refill.

## 2019-03-16 NOTE — Telephone Encounter (Signed)
Rx has been sent to the pharmacy electronically. ° °

## 2019-03-16 NOTE — Telephone Encounter (Signed)
°*  STAT* If patient is at the pharmacy, call can be transferred to refill team.   1. Which medications need to be refilled? (please list name of each medication and dose if known) xareto 15 mg 2. Which pharmacy/location (including street and city if local pharmacy) is medication to be sent to? [facility] [address]  3. Do they need a 30 day or 90 day supply? 30 patient just got the hospital and she is out.

## 2019-03-27 ENCOUNTER — Telehealth: Payer: Self-pay | Admitting: Cardiology

## 2019-03-27 DIAGNOSIS — N390 Urinary tract infection, site not specified: Secondary | ICD-10-CM | POA: Diagnosis not present

## 2019-03-27 NOTE — Telephone Encounter (Signed)
New Message:  Patient hallucinated yesterday , and then had some issues with her heart acting up. She said her heart was beating fast and her BP was higher than normal  (BP 154/100 HR 111) She is scared and would really like to speak with Sherri, Dr. Macky Lower nurse.

## 2019-03-27 NOTE — Telephone Encounter (Signed)
lmtcb

## 2019-03-28 ENCOUNTER — Ambulatory Visit (HOSPITAL_COMMUNITY): Payer: PPO | Admitting: Physician Assistant

## 2019-04-05 ENCOUNTER — Other Ambulatory Visit: Payer: Self-pay

## 2019-04-05 ENCOUNTER — Telehealth (HOSPITAL_COMMUNITY): Payer: Self-pay

## 2019-04-05 ENCOUNTER — Ambulatory Visit (HOSPITAL_COMMUNITY)
Admission: RE | Admit: 2019-04-05 | Discharge: 2019-04-05 | Disposition: A | Discharge status: Home / Self Care | Payer: PPO | Source: Ambulatory Visit | Attending: Physician Assistant | Admitting: Physician Assistant

## 2019-04-05 VITALS — BP 145/95 | HR 106

## 2019-04-05 DIAGNOSIS — I480 Paroxysmal atrial fibrillation: Secondary | ICD-10-CM

## 2019-04-05 DIAGNOSIS — D6869 Other thrombophilia: Secondary | ICD-10-CM

## 2019-04-05 NOTE — Addendum Note (Signed)
Encounter addended by: Oliver Barre, PA on: 04/05/2019 12:04 PM  Actions taken: Charge Capture section accepted

## 2019-04-05 NOTE — Telephone Encounter (Signed)
-----   Message from Oliver Barre, [place] sent at 04/05/2019 12:01 PM EST ----- Regarding: telemed orders Hey, can you get Ms Corbit a one month follow up with me in the clinic? Thanks.

## 2019-04-05 NOTE — Telephone Encounter (Signed)
Call patient left voicemail to return call to schedule one month f/u

## 2019-04-05 NOTE — Progress Notes (Signed)
Electrophysiology TeleHealth Note   Due to national recommendations of social distancing due to COVID 19, Audio telehealth visit is felt to be most appropriate for this patient at this time.  See consent below from today for patient consent regarding telehealth for the [facility]. Consent obtained verbally.   Date:  04/05/2019   ID:  [name] [name], DOB 1940/06/14, MRN [id number]  Location: home  Provider location: [address] [place], [place] [address] Evaluation Performed: Follow up  PCP:  Geoffry Paradise, MD  Primary Cardiologist:  Dr Tenny Craw Primary Electrophysiologist: Dr Elberta Fortis   CC: Follow up for atrial fibrillation.   History of Present Illness: [name] [name] is a 78 y.o. female who presents via audio conferencing for a telehealth visit today. She has a history of hyperlipidemia, hypertension, mitral valve repair and left atrial appendage clipping with maze in 2014.  Coronary angiogram May 2017 was normal.  She has a history of amiodarone induced thyroiditis.  She had an echo performed February 2019 with severe LV systolic dysfunction. June 2017 showed no LV systolic dysfunction.  She was having 22% PVCs which were monomorphic. She was felt to be a high risk for PVC ablation and she was started on sotalol. She is on Xarelto for a CHADS2VASC score of 4.  On follow up today, patient reports that about one week ago, she had elevated heart rate and blood pressure and had a brief episode of hallucination. She was found to have a UTI and was started on antibiotics. She has [facility] coming to her home to recheck 04/10/19. She states that today she feels much better with more energy and no heart racing symptoms. She is tolerating the sotalol without difficulty.   Today, she denies symptoms of palpitations, chest pain, shortness of breath, orthopnea, PND, lower extremity edema, claudication, dizziness, presyncope, syncope, bleeding, or neurologic sequela. The  patient is tolerating medications without difficulties and is otherwise without complaint today.    Atrial Fibrillation Risk Factors:  she does not have symptoms or diagnosis of sleep apnea.  she has a BMI of There is no height or weight on file to calculate BMI..  BP 145/95 Pulse 106 Provided by patient's home BP machine.  Past Medical History:  Diagnosis Date  . Acute coronary syndrome (HCC) 06/2015  . 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 Anemia   . Anxiety   . Aortic stenosis 05/19/2012   trivial by TEE  . Atrial fibrillation (HCC)    a. Dx 04/2012;  b. Amio d/c'd 2/2 hyperthyroidism;  c. Atrial flutter 10/2012;  d. On xarelto.  [name]   . CHF (congestive heart failure) (HCC)   . Complication of anesthesia   . Coronary artery disease   . Diverticulitis   . GERD (gastroesophageal reflux disease)   . Goiter   . Headache   . Heart murmur   . Hyperlipidemia    recently- taken off Crestor- due to pain in her legs, but this was after cardiac cath. , pt. questioning whether the pain in her legs was related to cath. or crestor  . Hypertension    Then hypotension 04/2012 limiting med adjustment.  . Intractable vomiting   . Malnutrition of moderate degree (HCC)   . Microscopic hematuria 08/20/2012  . Mitral valve prolapse    a. H/o MVP, with severe MR 04/2012, had MV repair and Maze  . Pneumonia    treated /w antibiotic- [facility]- early Feb. 2014  . PONV (postoperative nausea  and vomiting)   . Refusal of blood transfusions as patient is Jehovah's Witness   . S/P Maze operation for atrial fibrillation 06/28/2012   Complete bilateral atrial lesion set using cryothermy ablation with oversewing of LA appendage via right minithoracotomy  . S/P mitral valve repair 06/28/2012   Complex valvuloplasty including quadrangular resection of posterior leaflet, sliding leaflet plasty, artificial Gore-tex neocord placement x2 and 32mm Sorin Memo 3D ring annuloplasty via right mini thoracotomy    . SBO (small bowel obstruction) (HCC)   . Shortness of breath    Past Surgical History:  Procedure Laterality Date  . CARDIAC CATHETERIZATION N/A 07/16/2015   Procedure: Left Heart Cath and Coronary Angiography;  Surgeon: Orpah CobbAjay Kadakia, MD;  Location: [facility] INVASIVE CV LAB;  Service: Cardiovascular;  Laterality: N/A;  . CARDIOVERSION N/A 07/04/2018   Procedure: CARDIOVERSION;  Surgeon: Vesta MixerNahser, Philip J, MD;  [facility];  Service: Cardiovascular;  Laterality: N/A;  . CORONARY ARTERY BYPASS GRAFT    . INTRAOPERATIVE TRANSESOPHAGEAL ECHOCARDIOGRAM N/A 06/28/2012   Procedure: INTRAOPERATIVE TRANSESOPHAGEAL ECHOCARDIOGRAM;  Surgeon: Purcell Nailslarence H Owen, MD;  [facility];  Service: Open Heart Surgery;  Laterality: N/A;  . LAPAROSCOPY N/A 05/21/2016   Procedure: LAPAROSCOPY DIAGNOSTIC, LYSIS OF ADHESION BAND;  Surgeon: Axel FillerArmando Ramirez, MD;  Location: [facility] OR;  Service: General;  Laterality: N/A;  . LEFT AND RIGHT HEART CATHETERIZATION WITH CORONARY ANGIOGRAM N/A 05/22/2012   Procedure: LEFT AND RIGHT HEART CATHETERIZATION WITH CORONARY ANGIOGRAM;  Surgeon: Kathleene Hazelhristopher D McAlhany, MD;  [facility];  Service: Cardiovascular;  Laterality: N/A;  . MINIMALLY INVASIVE MAZE PROCEDURE N/A 06/28/2012   Procedure: MINIMALLY INVASIVE MAZE PROCEDURE;  Surgeon: Purcell Nailslarence H Owen, MD;  Location: [facility] OR;  Service: Open Heart Surgery;  Laterality: N/A;  . MITRAL VALVE REPAIR Right 06/28/2012   Procedure: MINIMALLY INVASIVE MITRAL VALVE REPAIR (MVR);  Surgeon: Purcell Nailslarence H Owen, MD;  [facility];  Service: Open Heart Surgery;  Laterality: Right;  . OVARIAN CYST SURGERY  1988  . TEE WITHOUT CARDIOVERSION  05/19/2012   Procedure: TRANSESOPHAGEAL ECHOCARDIOGRAM (TEE);  Surgeon: Dolores Pattyaniel R Bensimhon, MD;  [facility];  Service: Cardiovascular;  Laterality: N/A;  . TUBAL LIGATION  1974  . URETHRAL STRICTURE DILATATION    . UTERINE FIBROID SURGERY  1988  . `       Current Outpatient Medications   Medication Sig Dispense Refill  . ALPRAZolam (XANAX) 0.25 MG tablet Take 0.125-0.25 mg by mouth 2 (two) times daily as needed for anxiety.     . cholecalciferol (VITAMIN D) 1000 units tablet Take 1,000 Units by mouth daily.     . ciprofloxacin (CIPRO) 500 MG tablet Take 1 tablet (500 mg total) by mouth 2 (two) times daily. 14 tablet 0  . fluticasone (FLONASE) 50 MCG/ACT nasal spray Place 1 spray into both nostrils daily as needed for allergies.     . Menthol, Topical Analgesic, (ICY HOT EX) Apply 1 application topically daily as needed (back pain).    . methimazole (TAPAZOLE) 5 MG tablet Take 5 mg by mouth every morning.     . metroNIDAZOLE (FLAGYL) 500 MG tablet Take 1 tablet (500 mg total) by mouth 2 (two) times daily. 14 tablet 0  . pantoprazole (PROTONIX) 40 MG tablet Take 40 mg by mouth daily.     . Rivaroxaban (XARELTO) 15 MG TABS tablet Take 1 tablet (15 mg total) by mouth daily with supper. 90 tablet 1  . sotalol (BETAPACE) 80 MG tablet Take 0.5 tablets (40  mg total) by mouth 2 (two) times daily. 60 tablet 3   No current facility-administered medications for this encounter.    Allergies:   Amiodarone, Diltiazem, Macrobid [nitrofurantoin monohyd macro], Mexiletine, Aspirin, Naproxen sodium, and Sulfa drugs cross reactors   Social History:  The patient  reports that she has never smoked. She has never used smokeless tobacco. She reports that she does not drink alcohol or use drugs.   Family History:  The patient's  family history includes Colon cancer in her father.    ROS:  Please see the history of present illness.   All other systems are personally reviewed and negative.    Recent Labs: 05/09/2018: B Natriuretic Peptide 141.0 12/11/2018: TSH 1.870 03/12/2019: ALT 42; BUN 14; Creatinine, Ser 0.74; Hemoglobin 13.6; Platelets 216; Potassium 4.4; Sodium 136  personally reviewed    Other studies personally reviewed: Additional studies/ records that were reviewed today include:  Epic notes   ASSESSMENT AND PLAN:  1.  Paroxysmal atrial fibrillation/atrial flutter Patient may have had an episode of afib 2/2 UTI and antibiotic treatment. Her symptoms have resolved. Continue sotalol 80 mg BID Continue Xarelto 15 mg daily Check ECG/bmet/mag on follow up.  This patients CHA2DS2-VASc Score and unadjusted Ischemic Stroke Rate (% per year) is equal to 4.8 % stroke rate/year from a score of 4  Above score calculated as 1 point each if present [CHF, HTN, DM, Vascular=MI/PAD/Aortic Plaque, Age if 65-74, or Female] Above score calculated as 2 points each if present [Age > 75, or Stroke/TIA/TE]  2. PVCs Not felt to be a good ablation candidate. On sotalol as above. Followed by Dr Curt Bears.   Follow-up in the AF clinic in one month.  Current medicines are reviewed at length with the patient today.   The patient does not have concerns regarding her medicines.  The following changes were made today:  none  Labs/ tests ordered today include:  No orders of the defined types were placed in this encounter.   Patient Risk:  after full review of this patients clinical status, I feel that they are at moderate risk at this time.   Today, I have spent 15 minutes with the patient with telehealth technology discussing the above.    Gwenlyn Perking PA-C 04/05/2019 12:02 PM  [facility] [address] [place], [place] [address] [phone]   I hereby voluntarily request, consent and authorize the [facility] and its employed or contracted physicians, physician assistants, nurse practitioners or other licensed health care professionals (the Practitioner), to provide me with telemedicine health care services (the "Services") as deemed necessary by the treating Practitioner. I acknowledge and consent to receive the Services by the Practitioner via telemedicine. I understand that the telemedicine visit will involve communicating with the  Practitioner through live audiovisual communication technology and the disclosure of certain medical information by electronic transmission. I acknowledge that I have been given the opportunity to request an in-person assessment or other available alternative prior to the telemedicine visit and am voluntarily participating in the telemedicine visit.   I understand that I have the right to withhold or withdraw my consent to the use of telemedicine in the course of my care at any time, without affecting my right to future care or treatment, and that the Practitioner or I may terminate the telemedicine visit at any time. I understand that I have the right to inspect all information obtained and/or recorded in the course of the telemedicine visit and  may receive copies of available information for a reasonable fee.  I understand that some of the potential risks of receiving the Services via telemedicine include:   Delay or interruption in medical evaluation due to technological equipment failure or disruption;  Information transmitted may not be sufficient (e.g. poor resolution of images) to allow for appropriate medical decision making by the Practitioner; and/or  In rare instances, security protocols could fail, causing a breach of personal health information.   Furthermore, I acknowledge that it is my responsibility to provide information about my medical history, conditions and care that is complete and accurate to the best of my ability. I acknowledge that Practitioner's advice, recommendations, and/or decision may be based on factors not within their control, such as incomplete or inaccurate data provided by me or distortions of diagnostic images or specimens that may result from electronic transmissions. I understand that the practice of medicine is not an exact science and that Practitioner makes no warranties or guarantees regarding treatment outcomes. I acknowledge that I will receive a copy of this  consent concurrently upon execution via email to the email address I last provided but may also request a printed copy by calling the office of the [facility] Clinic.  I understand that my insurance will be billed for this visit.   I have read or had this consent read to me.  I understand the contents of this consent, which adequately explains the benefits and risks of the Services being provided via telemedicine.  I have been provided ample opportunity to ask questions regarding this consent and the Services and have had my questions answered to my satisfaction.  I give my informed consent for the services to be provided through the use of telemedicine in my medical care  By participating in this telemedicine visit I agree to the above.

## 2019-04-06 ENCOUNTER — Telehealth (HOSPITAL_COMMUNITY): Payer: Self-pay

## 2019-04-06 NOTE — Telephone Encounter (Signed)
Called patient and left voicemail to schedule one month follow up

## 2019-04-06 NOTE — Telephone Encounter (Signed)
-----   Message from Clint R Fenton, PA sent at 04/05/2019 12:01 PM EST ----- Regarding: telemed orders Hey, can you get Ms Langford a one month follow up with me in the clinic? Thanks.  

## 2019-04-18 ENCOUNTER — Emergency Department (HOSPITAL_BASED_OUTPATIENT_CLINIC_OR_DEPARTMENT_OTHER)
Admission: EM | Admit: 2019-04-18 | Discharge: 2019-04-18 | Disposition: A | Discharge status: Home / Self Care | Payer: PPO | Source: Home / Self Care | Attending: Emergency Medicine | Admitting: Emergency Medicine

## 2019-04-18 ENCOUNTER — Other Ambulatory Visit: Payer: Self-pay

## 2019-04-18 ENCOUNTER — Emergency Department (HOSPITAL_BASED_OUTPATIENT_CLINIC_OR_DEPARTMENT_OTHER): Payer: PPO

## 2019-04-18 ENCOUNTER — Encounter (HOSPITAL_BASED_OUTPATIENT_CLINIC_OR_DEPARTMENT_OTHER): Payer: Self-pay | Admitting: *Deleted

## 2019-04-18 DIAGNOSIS — K5792 Diverticulitis of intestine, part unspecified, without perforation or abscess without bleeding: Secondary | ICD-10-CM

## 2019-04-18 DIAGNOSIS — R1084 Generalized abdominal pain: Secondary | ICD-10-CM | POA: Diagnosis not present

## 2019-04-18 DIAGNOSIS — K5732 Diverticulitis of large intestine without perforation or abscess without bleeding: Secondary | ICD-10-CM | POA: Diagnosis not present

## 2019-04-18 DIAGNOSIS — I4891 Unspecified atrial fibrillation: Secondary | ICD-10-CM | POA: Insufficient documentation

## 2019-04-18 DIAGNOSIS — K5730 Diverticulosis of large intestine without perforation or abscess without bleeding: Secondary | ICD-10-CM | POA: Diagnosis not present

## 2019-04-18 DIAGNOSIS — Z951 Presence of aortocoronary bypass graft: Secondary | ICD-10-CM | POA: Insufficient documentation

## 2019-04-18 DIAGNOSIS — Z7901 Long term (current) use of anticoagulants: Secondary | ICD-10-CM | POA: Insufficient documentation

## 2019-04-18 DIAGNOSIS — R52 Pain, unspecified: Secondary | ICD-10-CM | POA: Diagnosis not present

## 2019-04-18 DIAGNOSIS — I509 Heart failure, unspecified: Secondary | ICD-10-CM | POA: Insufficient documentation

## 2019-04-18 DIAGNOSIS — R109 Unspecified abdominal pain: Secondary | ICD-10-CM | POA: Diagnosis not present

## 2019-04-18 DIAGNOSIS — I110 Hypertensive heart disease with heart failure: Secondary | ICD-10-CM | POA: Insufficient documentation

## 2019-04-18 DIAGNOSIS — Z79899 Other long term (current) drug therapy: Secondary | ICD-10-CM | POA: Insufficient documentation

## 2019-04-18 DIAGNOSIS — K5900 Constipation, unspecified: Secondary | ICD-10-CM | POA: Diagnosis not present

## 2019-04-18 DIAGNOSIS — R1032 Left lower quadrant pain: Secondary | ICD-10-CM | POA: Diagnosis not present

## 2019-04-18 LAB — CBC WITH DIFFERENTIAL/PLATELET
Abs Immature Granulocytes: 0.04 10*3/uL (ref 0.00–0.07)
Basophils Absolute: 0 10*3/uL (ref 0.0–0.1)
Basophils Relative: 0 %
Eosinophils Absolute: 0 10*3/uL (ref 0.0–0.5)
Eosinophils Relative: 0 %
HCT: 42.1 % (ref 36.0–46.0)
Hemoglobin: 13.6 g/dL (ref 12.0–15.0)
Immature Granulocytes: 0 %
Lymphocytes Relative: 9 %
Lymphs Abs: 1.3 10*3/uL (ref 0.7–4.0)
MCH: 29.6 pg (ref 26.0–34.0)
MCHC: 32.3 g/dL (ref 30.0–36.0)
MCV: 91.5 fL (ref 80.0–100.0)
Monocytes Absolute: 0.8 10*3/uL (ref 0.1–1.0)
Monocytes Relative: 6 %
Neutro Abs: 11.7 10*3/uL — ABNORMAL HIGH (ref 1.7–7.7)
Neutrophils Relative %: 85 %
Platelets: 208 10*3/uL (ref 150–400)
RBC: 4.6 MIL/uL (ref 3.87–5.11)
RDW: 13.8 % (ref 11.5–15.5)
WBC: 13.8 10*3/uL — ABNORMAL HIGH (ref 4.0–10.5)
nRBC: 0 % (ref 0.0–0.2)

## 2019-04-18 LAB — LACTIC ACID, PLASMA: Lactic Acid, Venous: 1.1 mmol/L (ref 0.5–1.9)

## 2019-04-18 LAB — COMPREHENSIVE METABOLIC PANEL
ALT: 32 U/L (ref 0–44)
AST: 45 U/L — ABNORMAL HIGH (ref 15–41)
Albumin: 4.2 g/dL (ref 3.5–5.0)
Alkaline Phosphatase: 90 U/L (ref 38–126)
Anion gap: 8 (ref 5–15)
BUN: 14 mg/dL (ref 8–23)
CO2: 27 mmol/L (ref 22–32)
Calcium: 10.1 mg/dL (ref 8.9–10.3)
Chloride: 102 mmol/L (ref 98–111)
Creatinine, Ser: 0.7 mg/dL (ref 0.44–1.00)
GFR calc Af Amer: >60 mL/min (ref >60)
GFR calc non Af Amer: >60 mL/min (ref >60)
Glucose, Bld: 119 mg/dL — ABNORMAL HIGH (ref 70–99)
Potassium: 4.1 mmol/L (ref 3.5–5.1)
Sodium: 137 mmol/L (ref 135–145)
Total Bilirubin: 1 mg/dL (ref 0.3–1.2)
Total Protein: 7.3 g/dL (ref 6.5–8.1)

## 2019-04-18 LAB — LIPASE, BLOOD: Lipase: 29 U/L (ref 11–51)

## 2019-04-18 MED ORDER — SODIUM CHLORIDE 0.9 % IV BOLUS
500.0000 mL | Freq: Once | INTRAVENOUS | Status: AC
  Administered 2019-04-18: 500 mL via INTRAVENOUS

## 2019-04-18 MED ORDER — MORPHINE SULFATE (PF) 2 MG/ML IV SOLN
2.0000 mg | Freq: Once | INTRAVENOUS | Status: AC
  Administered 2019-04-18: 2 mg via INTRAVENOUS
  Filled 2019-04-18: qty 1

## 2019-04-18 MED ORDER — MORPHINE SULFATE (PF) 4 MG/ML IV SOLN
4.0000 mg | Freq: Once | INTRAVENOUS | Status: AC
  Administered 2019-04-18: 12:00:00 4 mg via INTRAVENOUS
  Filled 2019-04-18: qty 1

## 2019-04-18 MED ORDER — AMOXICILLIN-POT CLAVULANATE 875-125 MG PO TABS: 1.0000 | ORAL_TABLET | Freq: Two times a day (BID) | ORAL | 0 refills | Status: DC

## 2019-04-18 MED ORDER — IOHEXOL 300 MG/ML  SOLN
100.0000 mL | Freq: Once | INTRAMUSCULAR | Status: AC | PRN
  Administered 2019-04-18: 100 mL via INTRAVENOUS

## 2019-04-18 NOTE — ED Triage Notes (Signed)
Last BM is Monday

## 2019-04-18 NOTE — ED Provider Notes (Signed)
[facility] EMERGENCY DEPARTMENT Provider Note   CSN: [id number] Arrival date & time: 04/18/19  2778     History Chief Complaint  Patient presents with  . constipation    [name] [name] is a 78 y.o. female.  HPI      79 year old female, with a PMH of A. fib, CHF, CAD, diverticulitis, HLD, HTN, presents with left lower quadrant abdominal pain.  Patient states symptoms started suddenly this morning around 2 AM.  She does note associated nausea initially however she had no vomiting and states nausea has resolved.  She notes she has not had a regular bowel movement since Monday.  She states that she did have a small amount of stool over the last couple of days.  She denies any hematochezia, melena, hematemesis.  Patient denies any fevers, chills.  Past Medical History:  Diagnosis Date  . Acute coronary syndrome (HCC) 06/2015  . 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 Anemia   . Anxiety   . Aortic stenosis 05/19/2012   trivial by TEE  . Atrial fibrillation (HCC)    a. Dx 04/2012;  b. Amio d/c'd 2/2 hyperthyroidism;  c. Atrial flutter 10/2012;  d. On xarelto.  [name]   . CHF (congestive heart failure) (HCC)   . Complication of anesthesia   . Coronary artery disease   . Diverticulitis   . GERD (gastroesophageal reflux disease)   . Goiter   . Headache   . Heart murmur   . Hyperlipidemia    recently- taken off Crestor- due to pain in her legs, but this was after cardiac cath. , pt. questioning whether the pain in her legs was related to cath. or crestor  . Hypertension    Then hypotension 04/2012 limiting med adjustment.  . Intractable vomiting   . Malnutrition of moderate degree (HCC)   . Microscopic hematuria 08/20/2012  . Mitral valve prolapse    a. H/o MVP, with severe MR 04/2012, had MV repair and Maze  . Pneumonia    treated /w antibiotic- [facility]- early Feb. 2014  . PONV (postoperative nausea and vomiting)   . Refusal of blood transfusions as patient  is Jehovah's Witness   . S/P Maze operation for atrial fibrillation 06/28/2012   Complete bilateral atrial lesion set using cryothermy ablation with oversewing of LA appendage via right minithoracotomy  . S/P mitral valve repair 06/28/2012   Complex valvuloplasty including quadrangular resection of posterior leaflet, sliding leaflet plasty, artificial Gore-tex neocord placement x2 and 40mm Sorin Memo 3D ring annuloplasty via right mini thoracotomy   . SBO (small bowel obstruction) (HCC)   . Shortness of breath     Patient Active Problem List   Diagnosis Date Noted  . Acquired thrombophilia (HCC) 04/05/2019  . Malnutrition of moderate degree 05/17/2016  . Bigeminy   . Intractable vomiting 05/15/2016  . Heart palpitations 05/15/2016  . SBO (small bowel obstruction) (HCC) 05/15/2016  . Acute coronary syndrome (HCC) 07/15/2015  . Status post Maze operation for atrial fibrillation 01/08/2013  . S/P MVR (mitral valve repair) 01/08/2013  . Atrial flutter (HCC) 10/30/2012  . Amaurosis fugax 10/30/2012  . Dyspnea 10/28/2012  . 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 Thyroid goiter 08/21/2012  . Microscopic hematuria 08/20/2012  . S/P mitral valve repair 06/28/2012  . History of mitral valve prolapse with severe mitral regurgtation 05/19/2012  . Refusal of blood transfusions as patient is Jehovah's Witness 05/19/2012  . Atrial fibrillation (HCC) 05/16/2012  . Anxiety 04/27/2011  . Hypertension  04/26/2011  . Diverticulitis 04/24/2011  . Hyperlipidemia     Past Surgical History:  Procedure Laterality Date  . CARDIAC CATHETERIZATION N/A 07/16/2015   Procedure: Left Heart Cath and Coronary Angiography;  Surgeon: Dixie Dials, MD;  Location: [place] CV LAB;  Service: Cardiovascular;  Laterality: N/A;  . CARDIOVERSION N/A 07/04/2018   Procedure: CARDIOVERSION;  Surgeon: Thayer Headings, MD;  Location: [facility] ENDOSCOPY;  Service: Cardiovascular;  Laterality: N/A;  . CORONARY ARTERY BYPASS  GRAFT    . INTRAOPERATIVE TRANSESOPHAGEAL ECHOCARDIOGRAM N/A 06/28/2012   Procedure: INTRAOPERATIVE TRANSESOPHAGEAL ECHOCARDIOGRAM;  Surgeon: Rexene Alberts, MD;  Location: [place];  Service: Open Heart Surgery;  Laterality: N/A;  . LAPAROSCOPY N/A 05/21/2016   Procedure: LAPAROSCOPY DIAGNOSTIC, LYSIS OF ADHESION BAND;  Surgeon: Ralene Ok, MD;  Location: [place];  Service: General;  Laterality: N/A;  . LEFT AND RIGHT HEART CATHETERIZATION WITH CORONARY ANGIOGRAM N/A 05/22/2012   Procedure: LEFT AND RIGHT HEART CATHETERIZATION WITH CORONARY ANGIOGRAM;  Surgeon: Burnell Blanks, MD;  Location: [facility] CATH LAB;  Service: Cardiovascular;  Laterality: N/A;  . MINIMALLY INVASIVE MAZE PROCEDURE N/A 06/28/2012   Procedure: MINIMALLY INVASIVE MAZE PROCEDURE;  Surgeon: Rexene Alberts, MD;  Location: [place];  Service: Open Heart Surgery;  Laterality: N/A;  . MITRAL VALVE REPAIR Right 06/28/2012   Procedure: MINIMALLY INVASIVE MITRAL VALVE REPAIR (MVR);  Surgeon: Rexene Alberts, MD;  Location: [place];  Service: Open Heart Surgery;  Laterality: Right;  . OVARIAN CYST SURGERY  1988  . TEE WITHOUT CARDIOVERSION  05/19/2012   Procedure: TRANSESOPHAGEAL ECHOCARDIOGRAM (TEE);  Surgeon: Jolaine Artist, MD;  Location: [place];  Service: Cardiovascular;  Laterality: N/A;  . TUBAL LIGATION  1974  . URETHRAL STRICTURE DILATATION    . [place]  . `       OB History   No obstetric history on file.     Family History  Problem Relation Age of Onset  . Colon cancer Father     Social History   Tobacco Use  . Smoking status: Never Smoker  . Smokeless tobacco: Never Used  Substance Use Topics  . Alcohol use: No    Comment: 1 drink a month  . Drug use: No    Home Medications Prior to Admission medications   Medication Sig Start Date End Date Taking? Authorizing Provider  ALPRAZolam (XANAX) 0.25 MG tablet Take 0.125-0.25 mg by mouth 2 (two) times daily as needed for anxiety.     Yes [provider]  cholecalciferol (VITAMIN D) 1000 units tablet Take 1,000 Units by mouth daily.    Yes [provider]  fluticasone (FLONASE) 50 MCG/ACT nasal spray Place 1 spray into both nostrils daily as needed for allergies.  12/08/12  Yes [provider]  methimazole (TAPAZOLE) 5 MG tablet Take 5 mg by mouth every morning.    Yes [provider]  pantoprazole (PROTONIX) 40 MG tablet Take 40 mg by mouth daily.  08/24/12  Yes Burnard Bunting, MD  Rivaroxaban (XARELTO) 15 MG TABS tablet Take 1 tablet (15 mg total) by mouth daily with supper. 03/16/19  Yes Camnitz, Will Hassell Done, MD  sotalol (BETAPACE) 80 MG tablet Take 0.5 tablets (40 mg total) by mouth 2 (two) times daily. 02/20/19  Yes Camnitz, Will Hassell Done, MD  Menthol, Topical Analgesic, (ICY HOT EX) Apply 1 application topically daily as needed (back pain).    [provider]    Allergies    Amiodarone, Diltiazem, Macrobid [  nitrofurantoin monohyd macro], Mexiletine, Aspirin, Naproxen sodium, and Sulfa drugs cross reactors  Review of Systems   Review of Systems  Constitutional: Negative for chills and fever.  Respiratory: Negative for shortness of breath.   Cardiovascular: Negative for chest pain.  Gastrointestinal: Positive for abdominal pain, constipation and nausea. Negative for blood in stool, diarrhea and vomiting.  All other systems reviewed and are negative.   Physical Exam Updated Vital Signs BP 131/71 (BP Location: Right Arm)   Pulse (!) 110   Temp 98.6 F (37 C) (Rectal)   Resp 16   SpO2 100%   Physical Exam Vitals and nursing note reviewed. Exam conducted with a chaperone present.  Constitutional:      Appearance: She is well-developed.  HENT:     Head: Normocephalic and atraumatic.  Eyes:     Conjunctiva/sclera: Conjunctivae normal.  Cardiovascular:     Rate and Rhythm: Regular rhythm. Tachycardia present.     Heart sounds: Normal heart sounds. No murmur.    Pulmonary:     Effort: Pulmonary effort is normal. No respiratory distress.     Breath sounds: Normal breath sounds. No wheezing or rales.  Abdominal:     General: Bowel sounds are normal.     Palpations: Abdomen is soft.     Tenderness: There is abdominal tenderness in the left lower quadrant. There is no guarding. Negative signs include McBurney's sign.  Genitourinary:    Rectum: Normal. No mass, tenderness or anal fissure.  Musculoskeletal:        General: No tenderness or deformity. Normal range of motion.     Cervical back: Neck supple.  Skin:    General: Skin is warm and dry.     Findings: No erythema or rash.  Neurological:     Mental Status: She is alert and oriented to person, place, and time.  Psychiatric:        Behavior: Behavior normal.     ED Results / Procedures / Treatments   Labs (all labs ordered are listed, but only abnormal results are displayed) Labs Reviewed  CBC WITH DIFFERENTIAL/PLATELET  COMPREHENSIVE METABOLIC PANEL  LIPASE, BLOOD  URINALYSIS, ROUTINE W REFLEX MICROSCOPIC  LACTIC ACID, PLASMA  LACTIC ACID, PLASMA    EKG None  Radiology No results found.  Procedures Procedures (including critical care time)  Medications Ordered in ED Medications  morphine 2 MG/ML injection 2 mg (has no administration in time range)    ED Course  I have reviewed the triage vital signs and the nursing notes.  Pertinent labs & imaging results that were available during my care of the patient were reviewed by me and considered in my medical decision making (see chart for details).  Clinical Course as of Apr 17 1350  Wed Apr 18, 2019  1038 Patient seen by myself as well as PA provider.  Briefly 78 year old female with a recent history of colitis approximately 1 month ago presenting to the ED with left lower quadrant abdominal pain and feelings of constipation.  This began in the past 24 hours.  She is describing discomfort in left lower side.  She feels a 7  difficulties of bowel movement.  She took milk of magnesia last night but this did not help with her symptoms or her bowel movement.  On exam she appears quite comfortable.  She does have some focal left lower quadrant tenderness which is mild on exam without rigidity or guarding.  White blood cell count is 13.8.  She does not  appear septic to me. She is pending a CT scan of her abdomen.   [MT]    Clinical Course User Index [MT] Trifan, Kermit BaloMatthew J, MD   MDM Rules/Calculators/A&P                      Patient presents with left lower quadrant abdominal pain.  She has a long history of diverticulitis.  Patient tender in the left lower quadrant.  Her blood work shows a slightly elevated white count at 13.8.  Otherwise blood work unremarkable.  She has a normal lipase and a normal lactic acid.  On CT scan she has no evidence of diverticulitis.  Given that she was recently treated with Cipro and Flagyl, will change antibiotics to Augmentin.  Patient noted to be slightly tachycardic.  I have reviewed her EMR and patient has had multiple ED visits and appointments with PCP where she has been tachycardic.  Patient afebrile checked rectally.  Overall patient is well-appearing, no acute distress, nontoxic, non-lethargic.  No evidence of sepsis.  Patient p.o. tolerant to water and food in the ED.  She is ready and stable for discharge.   At this time there does not appear to be any evidence of an acute emergency medical condition and the patient appears stable for discharge with appropriate outpatient follow up.Diagnosis was discussed with patient who verbalizes understanding and is agreeable to discharge. Pt case discussed with Dr. Renaye Rakersrifan who agrees with my plan.    Final Clinical Impression(s) / ED Diagnoses Final diagnoses:  None    Rx / DC Orders ED Discharge Orders    None       Rueben BashKendrick, Delayla Hoffmaster S, PA-C 04/18/19 1807    Terald Sleeperrifan, Matthew J, MD 04/18/19 [phone]

## 2019-04-18 NOTE — ED Notes (Signed)
ED Provider at bedside. 

## 2019-04-18 NOTE — ED Notes (Signed)
Pt wants update on eating and when she can leave

## 2019-04-18 NOTE — ED Notes (Signed)
Pt updated that PA wants her HR to go down before she can leave. Pt given crackers and drink. Lights turned off, asked her to rest.

## 2019-04-18 NOTE — Discharge Instructions (Addendum)
Take Augmentin as prescribed.  Drink plenty of fluids.  Eat a bland diet and advance diet as tolerated.  Please follow-up with your GI doctor and your primary care doctor within 1 week for continued evaluation.  Return to the ED immediately for new or worsening symptoms or concerns, such as new or worsening abdominal pain, vomiting, fevers, bloody diarrhea or any concerns at all.

## 2019-04-18 NOTE — ED Triage Notes (Signed)
Per EMS, constipation x 2 days with abd pain. Tried miralax without relief.

## 2019-04-20 ENCOUNTER — Other Ambulatory Visit: Payer: Self-pay

## 2019-04-20 ENCOUNTER — Observation Stay (HOSPITAL_COMMUNITY): Payer: PPO

## 2019-04-20 ENCOUNTER — Inpatient Hospital Stay (HOSPITAL_COMMUNITY)
Admission: EM | Admit: 2019-04-20 | Discharge: 2019-04-25 | DRG: 391 | Disposition: A | Discharge status: Skilled Nursing Facility | Payer: PPO | Attending: Internal Medicine | Admitting: Internal Medicine

## 2019-04-20 ENCOUNTER — Emergency Department (HOSPITAL_COMMUNITY): Payer: PPO

## 2019-04-20 ENCOUNTER — Encounter (HOSPITAL_COMMUNITY): Payer: Self-pay

## 2019-04-20 DIAGNOSIS — E876 Hypokalemia: Secondary | ICD-10-CM | POA: Diagnosis present

## 2019-04-20 DIAGNOSIS — E43 Unspecified severe protein-calorie malnutrition: Secondary | ICD-10-CM | POA: Diagnosis not present

## 2019-04-20 DIAGNOSIS — K5792 Diverticulitis of intestine, part unspecified, without perforation or abscess without bleeding: Secondary | ICD-10-CM | POA: Diagnosis not present

## 2019-04-20 DIAGNOSIS — D649 Anemia, unspecified: Secondary | ICD-10-CM

## 2019-04-20 DIAGNOSIS — N390 Urinary tract infection, site not specified: Secondary | ICD-10-CM | POA: Diagnosis not present

## 2019-04-20 DIAGNOSIS — R52 Pain, unspecified: Secondary | ICD-10-CM | POA: Diagnosis not present

## 2019-04-20 DIAGNOSIS — Z20828 Contact with and (suspected) exposure to other viral communicable diseases: Secondary | ICD-10-CM | POA: Diagnosis present

## 2019-04-20 DIAGNOSIS — K219 Gastro-esophageal reflux disease without esophagitis: Secondary | ICD-10-CM | POA: Diagnosis present

## 2019-04-20 DIAGNOSIS — E0590 Thyrotoxicosis, unspecified without thyrotoxic crisis or storm: Secondary | ICD-10-CM | POA: Diagnosis not present

## 2019-04-20 DIAGNOSIS — R1032 Left lower quadrant pain: Secondary | ICD-10-CM

## 2019-04-20 DIAGNOSIS — I482 Chronic atrial fibrillation, unspecified: Secondary | ICD-10-CM | POA: Diagnosis not present

## 2019-04-20 DIAGNOSIS — Z7401 Bed confinement status: Secondary | ICD-10-CM | POA: Diagnosis not present

## 2019-04-20 DIAGNOSIS — I10 Essential (primary) hypertension: Secondary | ICD-10-CM | POA: Diagnosis present

## 2019-04-20 DIAGNOSIS — Z66 Do not resuscitate: Secondary | ICD-10-CM | POA: Diagnosis not present

## 2019-04-20 DIAGNOSIS — K5732 Diverticulitis of large intestine without perforation or abscess without bleeding: Secondary | ICD-10-CM | POA: Diagnosis not present

## 2019-04-20 DIAGNOSIS — Z8701 Personal history of pneumonia (recurrent): Secondary | ICD-10-CM

## 2019-04-20 DIAGNOSIS — K5900 Constipation, unspecified: Secondary | ICD-10-CM | POA: Diagnosis not present

## 2019-04-20 DIAGNOSIS — I4891 Unspecified atrial fibrillation: Secondary | ICD-10-CM | POA: Diagnosis present

## 2019-04-20 DIAGNOSIS — F419 Anxiety disorder, unspecified: Secondary | ICD-10-CM | POA: Diagnosis present

## 2019-04-20 DIAGNOSIS — R1310 Dysphagia, unspecified: Secondary | ICD-10-CM | POA: Diagnosis not present

## 2019-04-20 DIAGNOSIS — N3289 Other specified disorders of bladder: Secondary | ICD-10-CM | POA: Diagnosis not present

## 2019-04-20 DIAGNOSIS — Z9889 Other specified postprocedural states: Secondary | ICD-10-CM | POA: Diagnosis not present

## 2019-04-20 DIAGNOSIS — Z789 Other specified health status: Secondary | ICD-10-CM

## 2019-04-20 DIAGNOSIS — M542 Cervicalgia: Secondary | ICD-10-CM | POA: Diagnosis present

## 2019-04-20 DIAGNOSIS — Z8679 Personal history of other diseases of the circulatory system: Secondary | ICD-10-CM

## 2019-04-20 DIAGNOSIS — I5043 Acute on chronic combined systolic (congestive) and diastolic (congestive) heart failure: Secondary | ICD-10-CM | POA: Diagnosis not present

## 2019-04-20 DIAGNOSIS — R0602 Shortness of breath: Secondary | ICD-10-CM

## 2019-04-20 DIAGNOSIS — I959 Hypotension, unspecified: Secondary | ICD-10-CM | POA: Diagnosis not present

## 2019-04-20 DIAGNOSIS — M25519 Pain in unspecified shoulder: Secondary | ICD-10-CM | POA: Diagnosis not present

## 2019-04-20 DIAGNOSIS — G8929 Other chronic pain: Secondary | ICD-10-CM | POA: Diagnosis present

## 2019-04-20 DIAGNOSIS — R4182 Altered mental status, unspecified: Secondary | ICD-10-CM | POA: Diagnosis not present

## 2019-04-20 DIAGNOSIS — I428 Other cardiomyopathies: Secondary | ICD-10-CM | POA: Diagnosis present

## 2019-04-20 DIAGNOSIS — E0500 Thyrotoxicosis with diffuse goiter without thyrotoxic crisis or storm: Secondary | ICD-10-CM | POA: Diagnosis present

## 2019-04-20 DIAGNOSIS — Z681 Body mass index (BMI) 19 or less, adult: Secondary | ICD-10-CM | POA: Diagnosis not present

## 2019-04-20 DIAGNOSIS — I340 Nonrheumatic mitral (valve) insufficiency: Secondary | ICD-10-CM | POA: Diagnosis not present

## 2019-04-20 DIAGNOSIS — IMO0001 Reserved for inherently not codable concepts without codable children: Secondary | ICD-10-CM

## 2019-04-20 DIAGNOSIS — I2510 Atherosclerotic heart disease of native coronary artery without angina pectoris: Secondary | ICD-10-CM | POA: Diagnosis present

## 2019-04-20 DIAGNOSIS — Z886 Allergy status to analgesic agent status: Secondary | ICD-10-CM

## 2019-04-20 DIAGNOSIS — Z79899 Other long term (current) drug therapy: Secondary | ICD-10-CM

## 2019-04-20 DIAGNOSIS — I110 Hypertensive heart disease with heart failure: Secondary | ICD-10-CM | POA: Diagnosis not present

## 2019-04-20 DIAGNOSIS — Z882 Allergy status to sulfonamides status: Secondary | ICD-10-CM

## 2019-04-20 DIAGNOSIS — M6281 Muscle weakness (generalized): Secondary | ICD-10-CM | POA: Diagnosis not present

## 2019-04-20 DIAGNOSIS — R000 Tachycardia, unspecified: Secondary | ICD-10-CM | POA: Diagnosis not present

## 2019-04-20 DIAGNOSIS — R519 Headache, unspecified: Secondary | ICD-10-CM | POA: Diagnosis not present

## 2019-04-20 DIAGNOSIS — F29 Unspecified psychosis not due to a substance or known physiological condition: Secondary | ICD-10-CM | POA: Diagnosis not present

## 2019-04-20 DIAGNOSIS — Z951 Presence of aortocoronary bypass graft: Secondary | ICD-10-CM

## 2019-04-20 DIAGNOSIS — E785 Hyperlipidemia, unspecified: Secondary | ICD-10-CM | POA: Diagnosis present

## 2019-04-20 DIAGNOSIS — D6869 Other thrombophilia: Secondary | ICD-10-CM | POA: Diagnosis not present

## 2019-04-20 DIAGNOSIS — M2550 Pain in unspecified joint: Secondary | ICD-10-CM | POA: Diagnosis not present

## 2019-04-20 DIAGNOSIS — K6389 Other specified diseases of intestine: Secondary | ICD-10-CM | POA: Diagnosis not present

## 2019-04-20 DIAGNOSIS — I480 Paroxysmal atrial fibrillation: Secondary | ICD-10-CM | POA: Diagnosis present

## 2019-04-20 DIAGNOSIS — Z888 Allergy status to other drugs, medicaments and biological substances status: Secondary | ICD-10-CM

## 2019-04-20 DIAGNOSIS — I491 Atrial premature depolarization: Secondary | ICD-10-CM | POA: Diagnosis not present

## 2019-04-20 DIAGNOSIS — Z881 Allergy status to other antibiotic agents status: Secondary | ICD-10-CM

## 2019-04-20 LAB — VITAMIN B12: Vitamin B-12: 512 pg/mL (ref 180–914)

## 2019-04-20 LAB — IRON AND TIBC
Iron: 54 ug/dL (ref 28–170)
Saturation Ratios: 19 % (ref 10.4–31.8)
TIBC: 289 ug/dL (ref 250–450)
UIBC: 235 ug/dL

## 2019-04-20 LAB — CBC WITH DIFFERENTIAL/PLATELET
Abs Immature Granulocytes: 0.03 10*3/uL (ref 0.00–0.07)
Basophils Absolute: 0 10*3/uL (ref 0.0–0.1)
Basophils Relative: 0 %
Eosinophils Absolute: 0 10*3/uL (ref 0.0–0.5)
Eosinophils Relative: 0 %
HCT: 28.3 % — ABNORMAL LOW (ref 36.0–46.0)
Hemoglobin: 8.9 g/dL — ABNORMAL LOW (ref 12.0–15.0)
Immature Granulocytes: 1 %
Lymphocytes Relative: 13 %
Lymphs Abs: 0.8 10*3/uL (ref 0.7–4.0)
MCH: 29.7 pg (ref 26.0–34.0)
MCHC: 31.4 g/dL (ref 30.0–36.0)
MCV: 94.3 fL (ref 80.0–100.0)
Monocytes Absolute: 0.4 10*3/uL (ref 0.1–1.0)
Monocytes Relative: 6 %
Neutro Abs: 4.9 10*3/uL (ref 1.7–7.7)
Neutrophils Relative %: 80 %
Platelets: 145 10*3/uL — ABNORMAL LOW (ref 150–400)
RBC: 3 MIL/uL — ABNORMAL LOW (ref 3.87–5.11)
RDW: 13.6 % (ref 11.5–15.5)
WBC: 6.1 10*3/uL (ref 4.0–10.5)
nRBC: 0 % (ref 0.0–0.2)

## 2019-04-20 LAB — TROPONIN I (HIGH SENSITIVITY)
Troponin I (High Sensitivity): 5 ng/L (ref <18)
Troponin I (High Sensitivity): 6 ng/L (ref <18)

## 2019-04-20 LAB — COMPREHENSIVE METABOLIC PANEL
ALT: 16 U/L (ref 0–44)
AST: 16 U/L (ref 15–41)
Albumin: 2.5 g/dL — ABNORMAL LOW (ref 3.5–5.0)
Alkaline Phosphatase: 52 U/L (ref 38–126)
Anion gap: 4 — ABNORMAL LOW (ref 5–15)
BUN: 7 mg/dL — ABNORMAL LOW (ref 8–23)
CO2: 20 mmol/L — ABNORMAL LOW (ref 22–32)
Calcium: 6.3 mg/dL — CL (ref 8.9–10.3)
Chloride: 115 mmol/L — ABNORMAL HIGH (ref 98–111)
Creatinine, Ser: 0.43 mg/dL — ABNORMAL LOW (ref 0.44–1.00)
GFR calc Af Amer: >60 mL/min (ref >60)
GFR calc non Af Amer: >60 mL/min (ref >60)
Glucose, Bld: 79 mg/dL (ref 70–99)
Potassium: 2.4 mmol/L — CL (ref 3.5–5.1)
Sodium: 139 mmol/L (ref 135–145)
Total Bilirubin: 0.7 mg/dL (ref 0.3–1.2)
Total Protein: 4.6 g/dL — ABNORMAL LOW (ref 6.5–8.1)

## 2019-04-20 LAB — LACTIC ACID, PLASMA: Lactic Acid, Venous: 1.7 mmol/L (ref 0.5–1.9)

## 2019-04-20 LAB — FERRITIN: Ferritin: 154 ng/mL (ref 11–307)

## 2019-04-20 LAB — TSH: TSH: 1.323 u[IU]/mL (ref 0.350–4.500)

## 2019-04-20 LAB — MAGNESIUM: Magnesium: 1.3 mg/dL — ABNORMAL LOW (ref 1.7–2.4)

## 2019-04-20 LAB — BRAIN NATRIURETIC PEPTIDE: B Natriuretic Peptide: 224.6 pg/mL — ABNORMAL HIGH (ref 0.0–100.0)

## 2019-04-20 LAB — POC SARS CORONAVIRUS 2 AG -  ED: SARS Coronavirus 2 Ag: NEGATIVE

## 2019-04-20 LAB — FOLATE: Folate: 20.6 ng/mL (ref >5.9)

## 2019-04-20 LAB — SARS CORONAVIRUS 2 (TAT 6-24 HRS): SARS Coronavirus 2: NEGATIVE

## 2019-04-20 LAB — VITAMIN D 25 HYDROXY (VIT D DEFICIENCY, FRACTURES): Vit D, 25-Hydroxy: 37.11 ng/mL (ref 30–100)

## 2019-04-20 MED ORDER — VITAMIN D3 25 MCG (1000 UNIT) PO TABS
1000.0000 [IU] | ORAL_TABLET | Freq: Every day | ORAL | Status: DC
  Administered 2019-04-21 – 2019-04-25 (×5): 1000 [IU] via ORAL
  Filled 2019-04-20 (×5): qty 1

## 2019-04-20 MED ORDER — METHIMAZOLE 5 MG PO TABS
5.0000 mg | ORAL_TABLET | Freq: Every day | ORAL | Status: DC
  Administered 2019-04-21 – 2019-04-25 (×5): 5 mg via ORAL
  Filled 2019-04-20 (×5): qty 1

## 2019-04-20 MED ORDER — SODIUM CHLORIDE 0.9 % IV SOLN
1.0000 g | Freq: Once | INTRAVENOUS | Status: AC
  Administered 2019-04-20: 1 g via INTRAVENOUS
  Filled 2019-04-20: qty 10

## 2019-04-20 MED ORDER — MAGNESIUM SULFATE 2 GM/50ML IV SOLN
2.0000 g | Freq: Once | INTRAVENOUS | Status: AC
  Administered 2019-04-20: 12:00:00 2 g via INTRAVENOUS
  Filled 2019-04-20: qty 50

## 2019-04-20 MED ORDER — SOTALOL HCL 80 MG PO TABS
40.0000 mg | ORAL_TABLET | Freq: Two times a day (BID) | ORAL | Status: DC
  Administered 2019-04-21 – 2019-04-25 (×10): 40 mg via ORAL
  Filled 2019-04-20 (×12): qty 0.5

## 2019-04-20 MED ORDER — PIPERACILLIN-TAZOBACTAM 3.375 G IVPB 30 MIN
3.3750 g | Freq: Once | INTRAVENOUS | Status: AC
  Administered 2019-04-20: 3.375 g via INTRAVENOUS
  Filled 2019-04-20: qty 50

## 2019-04-20 MED ORDER — STERILE WATER FOR INJECTION IJ SOLN
INTRAMUSCULAR | Status: AC
  Administered 2019-04-20: 21:00:00 10 mL
  Filled 2019-04-20: qty 10

## 2019-04-20 MED ORDER — PROCHLORPERAZINE EDISYLATE 10 MG/2ML IJ SOLN
10.0000 mg | Freq: Once | INTRAMUSCULAR | Status: AC
  Administered 2019-04-20: 10 mg via INTRAVENOUS
  Filled 2019-04-20: qty 2

## 2019-04-20 MED ORDER — POTASSIUM CHLORIDE IN NACL 20-0.9 MEQ/L-% IV SOLN
INTRAVENOUS | Status: AC
  Filled 2019-04-20: qty 1000

## 2019-04-20 MED ORDER — PANTOPRAZOLE SODIUM 40 MG IV SOLR
40.0000 mg | INTRAVENOUS | Status: DC
  Administered 2019-04-20 – 2019-04-22 (×3): 40 mg via INTRAVENOUS
  Filled 2019-04-20 (×3): qty 40

## 2019-04-20 MED ORDER — ALPRAZOLAM 0.25 MG PO TABS
0.1250 mg | ORAL_TABLET | Freq: Two times a day (BID) | ORAL | Status: DC | PRN
  Administered 2019-04-21 – 2019-04-22 (×2): 0.25 mg via ORAL
  Administered 2019-04-23: 0.125 mg via ORAL
  Administered 2019-04-24 – 2019-04-25 (×4): 0.25 mg via ORAL
  Filled 2019-04-20 (×7): qty 1

## 2019-04-20 MED ORDER — POTASSIUM CHLORIDE CRYS ER 20 MEQ PO TBCR
40.0000 meq | EXTENDED_RELEASE_TABLET | ORAL | Status: AC
  Filled 2019-04-20: qty 2

## 2019-04-20 MED ORDER — POTASSIUM CHLORIDE 10 MEQ/100ML IV SOLN
10.0000 meq | INTRAVENOUS | Status: AC
  Administered 2019-04-20 (×2): 10 meq via INTRAVENOUS
  Filled 2019-04-20 (×2): qty 100

## 2019-04-20 MED ORDER — LORAZEPAM 2 MG/ML IJ SOLN
0.5000 mg | Freq: Once | INTRAMUSCULAR | Status: AC
  Administered 2019-04-20: 09:00:00 0.5 mg via INTRAVENOUS
  Filled 2019-04-20: qty 1

## 2019-04-20 MED ORDER — ACETAMINOPHEN 325 MG PO TABS
650.0000 mg | ORAL_TABLET | Freq: Four times a day (QID) | ORAL | Status: DC | PRN
  Administered 2019-04-21 – 2019-04-25 (×8): 650 mg via ORAL
  Filled 2019-04-20 (×10): qty 2

## 2019-04-20 MED ORDER — PIPERACILLIN-TAZOBACTAM 3.375 G IVPB
3.3750 g | Freq: Three times a day (TID) | INTRAVENOUS | Status: DC
  Administered 2019-04-20 – 2019-04-23 (×9): 3.375 g via INTRAVENOUS
  Filled 2019-04-20 (×9): qty 50

## 2019-04-20 MED ORDER — MAGNESIUM OXIDE 400 (241.3 MG) MG PO TABS
400.0000 mg | ORAL_TABLET | Freq: Two times a day (BID) | ORAL | Status: AC
  Administered 2019-04-20 – 2019-04-23 (×6): 400 mg via ORAL
  Filled 2019-04-20 (×6): qty 1

## 2019-04-20 MED ORDER — ACETAMINOPHEN 650 MG RE SUPP: 650.0000 mg | Freq: Four times a day (QID) | RECTAL | Status: DC | PRN

## 2019-04-20 MED ORDER — IOHEXOL 9 MG/ML PO SOLN
ORAL | Status: AC
  Filled 2019-04-20: qty 1000

## 2019-04-20 MED ORDER — TRAMADOL HCL 50 MG PO TABS
50.0000 mg | ORAL_TABLET | Freq: Three times a day (TID) | ORAL | Status: DC | PRN
  Administered 2019-04-20 – 2019-04-25 (×13): 50 mg via ORAL
  Filled 2019-04-20 (×14): qty 1

## 2019-04-20 MED ORDER — FLUTICASONE PROPIONATE 50 MCG/ACT NA SUSP
1.0000 | Freq: Every day | NASAL | Status: DC | PRN
  Filled 2019-04-20: qty 16

## 2019-04-20 MED ORDER — IOHEXOL 9 MG/ML PO SOLN
500.0000 mL | ORAL | Status: AC
  Administered 2019-04-20: 17:00:00 500 mL via ORAL

## 2019-04-20 MED ORDER — SODIUM CHLORIDE 0.9 % IV BOLUS
500.0000 mL | Freq: Once | INTRAVENOUS | Status: AC
  Administered 2019-04-20: 500 mL via INTRAVENOUS

## 2019-04-20 NOTE — Progress Notes (Signed)
Pharmacy Antibiotic Note  [name] [name] is a 78 y.o. female admitted on 04/20/2019 with diverticulitis.  Pharmacy has been consulted for Zosyn dosing.  -Renal function at baseline, CrCl>82ml/min  Plan: Zosyn 3.375g IV q8h (4 hour infusion).  No dose adjustments anticipated.  Pharmacy will sign off & monitor peripherally via electronic surveillance software.  Please re-consult if needed.      Temp (24hrs), Avg:98.5 F (36.9 C), Min:98.5 F (36.9 C), Max:98.5 F (36.9 C)  Recent Labs  Lab 04/18/19 0958 04/20/19 0835  WBC 13.8* 6.1  CREATININE 0.70 0.43*  LATICACIDVEN 1.1  --     Estimated Creatinine Clearance: 40.9 mL/min (A) (by C-G formula based on SCr of 0.43 mg/dL (L)).    Allergies  Allergen Reactions  . Amiodarone Other (See Comments)    Hyperthyroidism   . Diltiazem Other (See Comments)    Put into hospital for 3 days  . Macrobid [Nitrofurantoin Monohyd Macro] Nausea Only  . Mexiletine     Confusion, pain in the face  . Aspirin Rash    Tolerates low dose aspirin  . Naproxen Sodium Swelling and Rash  . Sulfa Drugs Cross Reactors Other (See Comments)    Allergy per mother - unknown reaction    Thank you for allowing pharmacy to be a part of this patient's care.  Netta Cedars, PharmD, BCPS 04/20/2019 1:15 PM

## 2019-04-20 NOTE — ED Notes (Signed)
Patient transported to CT 

## 2019-04-20 NOTE — ED Provider Notes (Signed)
[facility]-EMERGENCY DEPT Provider Note   CSN: [id number] Arrival date & time: 04/20/19  0710     History Headache, nausea, shortness of breath  [name] [name] is a 78 y.o. female with a history of atrial fibrillation, congestive heart failure, hypertension, hyperlipidemia, malnutrition, chronic abdominal pain, presented to the emergency department with headache, shortness of breath, abdominal pain.  Patient was seen by myself at [facility] approximately 3 days ago for abdominal pain.  At that time she had CT imaging of the abdomen showed possible mild diverticulitis, with no other acute pathology  (an old L3 endplate compression fracture was noted, seen on prior imaging).  She was started on Augmentin, with now her second course of antibiotics for diverticulitis.  She reports she has been taking the Augmentin, but reports it is upsetting her stomach.  She continues to feel nauseated have left lower sided abdominal pain.  She feels very weak.  Subsequently after leaving the emergency department 3 days ago, she has now developed a headache and shortness of breath.  She is her headache began earlier this morning is gradual onset and now is throbbing.  She reports her shortness of breath is with exertion.  She denies any pulmonary history history of smoking.  She reports that she been having chills at home.  HPI     Past Medical History:  Diagnosis Date  . Acute coronary syndrome (HCC) 06/2015  . 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 Anemia   . Anxiety   . Aortic stenosis 05/19/2012   trivial by TEE  . Atrial fibrillation (HCC)    a. Dx 04/2012;  b. Amio d/c'd 2/2 hyperthyroidism;  c. Atrial flutter 10/2012;  d. On xarelto.  [name]   . CHF (congestive heart failure) (HCC)   . Complication of anesthesia   . Coronary artery disease   . Diverticulitis   . GERD (gastroesophageal reflux disease)   . Goiter   . Headache   . Heart murmur   .  Hyperlipidemia    recently- taken off Crestor- due to pain in her legs, but this was after cardiac cath. , pt. questioning whether the pain in her legs was related to cath. or crestor  . Hypertension    Then hypotension 04/2012 limiting med adjustment.  . Intractable vomiting   . Malnutrition of moderate degree (HCC)   . Microscopic hematuria 08/20/2012  . Mitral valve prolapse    a. H/o MVP, with severe MR 04/2012, had MV repair and Maze  . Pneumonia    treated /w antibiotic- [facility]- early Feb. 2014  . PONV (postoperative nausea and vomiting)   . Refusal of blood transfusions as patient is Jehovah's Witness   . S/P Maze operation for atrial fibrillation 06/28/2012   Complete bilateral atrial lesion set using cryothermy ablation with oversewing of LA appendage via right minithoracotomy  . S/P mitral valve repair 06/28/2012   Complex valvuloplasty including quadrangular resection of posterior leaflet, sliding leaflet plasty, artificial Gore-tex neocord placement x2 and 57mm Sorin Memo 3D ring annuloplasty via right mini thoracotomy   . SBO (small bowel obstruction) (HCC)   . Shortness of breath     Patient Active Problem List   Diagnosis Date Noted  . Hypokalemia 04/20/2019  . Hypomagnesemia 04/20/2019  . Anemia 04/20/2019  . Patient is Jehovah's Witness 04/20/2019  . Acquired thrombophilia (HCC) 04/05/2019  . Malnutrition of moderate degree 05/17/2016  . Bigeminy   . Intractable vomiting 05/15/2016  . Heart  palpitations 05/15/2016  . SBO (small bowel obstruction) (HCC) 05/15/2016  . Acute coronary syndrome (HCC) 07/15/2015  . Status post Maze operation for atrial fibrillation 01/08/2013  . S/P MVR (mitral valve repair) 01/08/2013  . Atrial flutter (HCC) 10/30/2012  . Amaurosis fugax 10/30/2012  . Dyspnea 10/28/2012  . 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 Thyroid goiter 08/21/2012  . Microscopic hematuria 08/20/2012  . S/P mitral valve repair 06/28/2012  . History of mitral  valve prolapse with severe mitral regurgtation 05/19/2012  . Refusal of blood transfusions as patient is Jehovah's Witness 05/19/2012  . Atrial fibrillation (HCC) 05/16/2012  . Anxiety 04/27/2011  . Hypertension 04/26/2011  . Diverticulitis 04/24/2011  . Hyperlipidemia     Past Surgical History:  Procedure Laterality Date  . CARDIAC CATHETERIZATION N/A 07/16/2015   Procedure: Left Heart Cath and Coronary Angiography;  Surgeon: Orpah CobbAjay Kadakia, MD;  Location: [facility] INVASIVE CV LAB;  Service: Cardiovascular;  Laterality: N/A;  . CARDIOVERSION N/A 07/04/2018   Procedure: CARDIOVERSION;  Surgeon: Vesta MixerNahser, Philip J, MD;  [facility];  Service: Cardiovascular;  Laterality: N/A;  . CORONARY ARTERY BYPASS GRAFT    . INTRAOPERATIVE TRANSESOPHAGEAL ECHOCARDIOGRAM N/A 06/28/2012   Procedure: INTRAOPERATIVE TRANSESOPHAGEAL ECHOCARDIOGRAM;  Surgeon: Purcell Nailslarence H Owen, MD;  [facility];  Service: Open Heart Surgery;  Laterality: N/A;  . LAPAROSCOPY N/A 05/21/2016   Procedure: LAPAROSCOPY DIAGNOSTIC, LYSIS OF ADHESION BAND;  Surgeon: Axel FillerArmando Ramirez, MD;  Location: [facility] OR;  Service: General;  Laterality: N/A;  . LEFT AND RIGHT HEART CATHETERIZATION WITH CORONARY ANGIOGRAM N/A 05/22/2012   Procedure: LEFT AND RIGHT HEART CATHETERIZATION WITH CORONARY ANGIOGRAM;  Surgeon: Kathleene Hazelhristopher D McAlhany, MD;  [facility];  Service: Cardiovascular;  Laterality: N/A;  . MINIMALLY INVASIVE MAZE PROCEDURE N/A 06/28/2012   Procedure: MINIMALLY INVASIVE MAZE PROCEDURE;  Surgeon: Purcell Nailslarence H Owen, MD;  Location: [facility] OR;  Service: Open Heart Surgery;  Laterality: N/A;  . MITRAL VALVE REPAIR Right 06/28/2012   Procedure: MINIMALLY INVASIVE MITRAL VALVE REPAIR (MVR);  Surgeon: Purcell Nailslarence H Owen, MD;  [facility];  Service: Open Heart Surgery;  Laterality: Right;  . OVARIAN CYST SURGERY  1988  . TEE WITHOUT CARDIOVERSION  05/19/2012   Procedure: TRANSESOPHAGEAL ECHOCARDIOGRAM (TEE);  Surgeon: Dolores Pattyaniel R Bensimhon, MD;   [facility];  Service: Cardiovascular;  Laterality: N/A;  . TUBAL LIGATION  1974  . URETHRAL STRICTURE DILATATION    . UTERINE FIBROID SURGERY  1988  . `       OB History   No obstetric history on file.     Family History  Problem Relation Age of Onset  . Colon cancer Father     Social History   Tobacco Use  . Smoking status: Never Smoker  . Smokeless tobacco: Never Used  Substance Use Topics  . Alcohol use: No    Comment: 1 drink a month  . Drug use: No    Home Medications Prior to Admission medications   Medication Sig Start Date End Date Taking? Authorizing Provider  ALPRAZolam (XANAX) 0.25 MG tablet Take 0.125-0.25 mg by mouth 2 (two) times daily as needed for anxiety.    Yes [provider]  amoxicillin-clavulanate (AUGMENTIN) 875-125 MG tablet Take 1 tablet by mouth every 12 (twelve) hours. 04/18/19  Yes Kendrick, Caitlyn S, PA-C  cholecalciferol (VITAMIN D) 1000 units tablet Take 1,000 Units by mouth daily.    Yes [provider]  fluticasone (FLONASE) 50 MCG/ACT nasal spray Place 1 spray into both nostrils daily  as needed for allergies.  12/08/12  Yes [provider]  Menthol, Topical Analgesic, (ICY HOT EX) Apply 1 application topically daily as needed (back pain).   Yes [provider]  methimazole (TAPAZOLE) 5 MG tablet Take 5 mg by mouth every morning.    Yes [provider]  pantoprazole (PROTONIX) 40 MG tablet Take 40 mg by mouth daily.  08/24/12  Yes Burnard Bunting, MD  Rivaroxaban (XARELTO) 15 MG TABS tablet Take 1 tablet (15 mg total) by mouth daily with supper. 03/16/19  Yes Camnitz, Will Hassell Done, MD  sotalol (BETAPACE) 80 MG tablet Take 0.5 tablets (40 mg total) by mouth 2 (two) times daily. 02/20/19  Yes Camnitz, Will Hassell Done, MD  traMADol (ULTRAM) 50 MG tablet Take 50 mg by mouth 3 (three) times daily as needed for moderate pain or severe pain.  04/16/19  Yes [provider]    Allergies      Amiodarone, Diltiazem, Macrobid [nitrofurantoin monohyd macro], Mexiletine, Aspirin, Naproxen sodium, and Sulfa drugs cross reactors  Review of Systems   Review of Systems  Constitutional: Positive for activity change, appetite change, chills and fatigue. Negative for fever.  Eyes: Negative for photophobia and visual disturbance.  Respiratory: Positive for shortness of breath. Negative for cough, chest tightness and wheezing.   Cardiovascular: Negative for chest pain and palpitations.  Gastrointestinal: Positive for abdominal pain, diarrhea, nausea and vomiting.  Musculoskeletal: Positive for myalgias. Negative for neck pain and neck stiffness.  Skin: Negative for pallor and rash.  Neurological: Positive for dizziness, light-headedness and headaches. Negative for seizures and syncope.  All other systems reviewed and are negative.   Physical Exam Updated Vital Signs BP 140/81   Pulse (!) 115   Temp 98.5 F (36.9 C) (Oral)   Resp 16   SpO2 98%   Physical Exam Vitals and nursing note reviewed.  Constitutional:      Appearance: She is well-developed.     Comments: Thin, frail  HENT:     Head: Normocephalic and atraumatic.  Eyes:     Conjunctiva/sclera: Conjunctivae normal.     Pupils: Pupils are equal, round, and reactive to light.  Cardiovascular:     Rate and Rhythm: Normal rate and regular rhythm.     Pulses: Normal pulses.  Pulmonary:     Effort: Pulmonary effort is normal. No respiratory distress.     Breath sounds: Normal breath sounds. No wheezing.     Comments: 100% O2 saturation on room air Abdominal:     Palpations: Abdomen is soft.     Tenderness: There is no abdominal tenderness.     Comments: Mild LLQ abdominal tenderness without guarding or rebound  Musculoskeletal:     Cervical back: Neck supple.  Skin:    General: Skin is warm and dry.  Neurological:     Mental Status: She is alert.     ED Results / Procedures / Treatments   Labs (all labs ordered  are listed, but only abnormal results are displayed) Labs Reviewed  COMPREHENSIVE METABOLIC PANEL - Abnormal; Notable for the following components:      Result Value   Potassium 2.4 (*)    Chloride 115 (*)    CO2 20 (*)    BUN 7 (*)    Creatinine, Ser 0.43 (*)    Calcium 6.3 (*)    Total Protein 4.6 (*)    Albumin 2.5 (*)    Anion gap 4 (*)    All other components within normal limits  CBC WITH DIFFERENTIAL/PLATELET - Abnormal; Notable for the following components:   RBC 3.00 (*)    Hemoglobin 8.9 (*)    HCT 28.3 (*)    Platelets 145 (*)    All other components within normal limits  BRAIN NATRIURETIC PEPTIDE - Abnormal; Notable for the following components:   B Natriuretic Peptide 224.6 (*)    All other components within normal limits  MAGNESIUM - Abnormal; Notable for the following components:   Magnesium 1.3 (*)    All other components within normal limits  SARS CORONAVIRUS 2 (TAT 6-24 HRS)  TSH  LACTIC ACID, PLASMA  LACTIC ACID, PLASMA  POC SARS CORONAVIRUS 2 AG -  ED  TROPONIN I (HIGH SENSITIVITY)  TROPONIN I (HIGH SENSITIVITY)    EKG EKG Interpretation  Date/Time:  Friday April 20 2019 08:48:44 EST Ventricular Rate:  115 PR Interval:    QRS Duration: 98 QT Interval:  296 QTC Calculation: 410 R Axis:   -82 Text Interpretation: Sinus tachycardia Multiple ventricular premature complexes Left anterior fascicular block LVH with secondary repolarization abnormality Probable anterior infarct, age indeterminate Baseline wander in lead(s) III No STEMI Confirmed by Alvester Chou [phone]) on 04/20/2019 10:40:19 AM   Radiology DG Chest 1 View  Result Date: 04/20/2019 CLINICAL DATA:  Shortness of breath. EXAM: CHEST  1 VIEW COMPARISON:  11/20/2018 and chest CT 12/05/2017 FINDINGS: Lungs are adequately inflated without focal airspace consolidation or effusion. Emphysematous disease is present. Evidence of patient's known moderate size right thyroid goiter.  Cardiomediastinal silhouette and remainder of the exam is unchanged. IMPRESSION: 1.  No acute cardiopulmonary disease. 2.  COPD. 3.  Known right thyroid goiter. Electronically Signed   By: Elberta Fortis M.D.   On: 04/20/2019 08:49    Procedures .Critical Care Performed by: Terald Sleeper, MD Authorized by: Terald Sleeper, MD   Critical care provider statement:    Critical care time (minutes):  -3   Critical care was necessary to treat or prevent imminent or life-threatening deterioration of the following conditions:  Metabolic crisis   Critical care was time spent personally by me on the following activities:  Discussions with consultants, evaluation of patient's response to treatment, examination of patient, ordering and performing treatments and interventions, ordering and review of laboratory studies, ordering and review of radiographic studies, pulse oximetry, re-evaluation of patient's condition, obtaining history from patient or surrogate and review of old charts Comments:     IV potassium, calcium, and magnesium for metabolic derangement   (including critical care time)  Medications Ordered in ED Medications  potassium chloride 10 mEq in 100 mL IVPB (has no administration in time range)  calcium gluconate 1 g in sodium chloride 0.9 % 100 mL IVPB (has no administration in time range)  magnesium sulfate IVPB 2 g 50 mL (2 g Intravenous New Bag/Given 04/20/19 1212)  prochlorperazine (COMPAZINE) injection 10 mg (10 mg Intravenous Given 04/20/19 0833)  sodium chloride 0.9 % bolus 500 mL (500 mLs Intravenous New Bag/Given 04/20/19 0833)  LORazepam (ATIVAN) injection 0.5 mg (0.5 mg Intravenous Given 04/20/19 4784)    ED Course  I have reviewed the triage vital signs and the nursing notes.  Pertinent labs & imaging results that were available during my care of the patient were reviewed by me and considered in my medical decision making (see chart for details).  78 year old female  with a history as noted above presenting to the ED with multiple complaints.  She was seen in our ER 3 days  ago, with CT scan report included in this note.  She was treated for mild diverticulitis at that time started on Augmentin.  She reports she has been compliant that continues to have abdominal pain.  She reports chills at home.  She is also reporting headaches and shortness of breath.  We will obtain a rapid Covid test.  Also get a troponin, chest x-ray, BMP and EKG to evaluate for cardiac and infectious pulmonary pathology as an explanation for her shortness of breath.  She is not hypoxic or tachypneic.  I have a very low suspicion for pulmonary embolism given her other constellation of symptoms.  This sounds more viral to me.  Likewise for the headache.  She has no neck stiffness or photophobia.  I have a low suspicion overall for meningitis or intracranial bleed.  We will try headache cocktail here.  Like to check her labs before considering another repeat abdominal scan.  I suspect she may have not given antibiotics enough time to properly work to treat her diverticulitis.  She does appear frail.  Need to ensure that she is not dehydrated.  I also will check a magnesium and her calcium level, as well as TSH.  Uvaldo Bristleatricia A Thibeau was evaluated in Emergency Department on 04/20/2019 for the symptoms described in the history of present illness. She was evaluated in the context of the global COVID-19 pandemic, which necessitated consideration that the patient might be at risk for infection with the SARS-CoV-2 virus that causes COVID-19. Institutional protocols and algorithms that pertain to the evaluation of patients at risk for COVID-19 are in a state of rapid change based on information released by regulatory bodies including the CDC and federal and state organizations. These policies and algorithms were followed during the patient's care in the ED.  This note was dictated using dragon dictation  software.  Please be aware that there may be minor translation errors as a result of this oral dictation   Clinical Course as of Apr 19 1213  Fri Apr 20, 2019  1116 Hemoglobin(!): 8.9 [MT]  1134 Potassium(!!): 2.4 [MT]  1135 Calcium(!!): 6.3 [MT]  1135 Ordered IV K and Ca, will admit to hospitalist   [MT]  1212 Signed out to hospitalist   [MT]    Clinical Course User Index [MT] Alfonse Garringer, Kermit BaloMatthew J, MD        Final Clinical Impression(s) / ED Diagnoses Final diagnoses:  Left lower quadrant abdominal pain  Hypokalemia  Hypocalcemia  Anemia, unspecified type  Hypomagnesemia    Rx / DC Orders ED Discharge Orders    None       Terald Sleeperrifan, Ridge Lafond J, MD 04/20/19 1214

## 2019-04-20 NOTE — ED Triage Notes (Signed)
Patient presented to ed with c/o shortness of breath, abdominal, and headache started yesterday.patient is rating her pain at 6/10.

## 2019-04-20 NOTE — H&P (Addendum)
History and Physical    [name] [name] IWL:[id number] DOB: 1940-05-22 DOA: 04/20/2019  PCP: Burnard Bunting, MD   Patient coming from: home lives alone  Chief Complaint: Shortness of breath  HPI: [name] [name] is a 78 y.o. female with medical history significant for atrial fibrillation/ H/o MVP, with severe MR 04/2012-s/p MV repair and Maze procedure, CAD-last cath 1740, systolic CHF LVEF 81% in 44/8185, chronic anemia, anxiety disorder, hyperlipidemia unable to tolerate Crestor history of diverticulitis-last Colonoscopy in 2013, followed by [facility] last seen in 2019 comes to ER for evaluation of shortness of breath abdominal pain and headache.  Patient was seen on 12/23 at urgent care for abdominal pain CT showed 'showed possible mild diverticulitis, with no other acute pathology  (an old L3 endplate compression fracture was noted, seen on prior imaging), placed on Augmentin and discharged home.  She has been taking Augmentin but has been having upset the stomach.  Complains of headache  throbbing in nature, also has Sob and DOE along with chills. Currently she complains of abdominal pain, headache - frontal area near Eye and denies chest pain.  In ED VSS- Bp stable 120-140, HR in 110s.  Rapid Covid antigen negative, PCR pending, labs showed potassium 2.4 mag 1.3 bicarb 20 WBC 6.1 hemoglobin 8.9 from 13.9 g 12/23, calcium 6.3.   CXR "No acute cardiopulmonary disease. 2.  COPD. 3.  Known right thyroid goiter".  Lactic acid pending, patient was given calcium chloride 1 g, Ativan 0.5, magnesium 2 g, potassium 10 Meq IV x2, NS 500 ml, compazine and admission was requested.  Review of Systems: All systems were reviewed and were negative except as mentioned in HPI above. Negative for nausea, vomiting Negative for chest pain  Past Medical History:  Diagnosis Date  . Acute coronary syndrome ([place]) 06/2015  . 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 Anemia   . Anxiety   . Aortic  stenosis 05/19/2012   trivial by TEE  . Atrial fibrillation ([place])    a. Dx 04/2012;  b. Amio d/c'd 2/2 hyperthyroidism;  c. Atrial flutter 10/2012;  d. On xarelto.  [name]   . CHF (congestive heart failure) ([place])   . Complication of anesthesia   . Coronary artery disease   . Diverticulitis   . GERD (gastroesophageal reflux disease)   . Goiter   . Headache   . Heart murmur   . Hyperlipidemia    recently- taken off Crestor- due to pain in her legs, but this was after cardiac cath. , pt. questioning whether the pain in her legs was related to cath. or crestor  . Hypertension    Then hypotension 04/2012 limiting med adjustment.  . Intractable vomiting   . Malnutrition of moderate degree ([place])   . Microscopic hematuria 08/20/2012  . Mitral valve prolapse    a. H/o MVP, with severe MR 04/2012, had MV repair and Maze  . Pneumonia    treated /w antibiotic- [facility]- early Feb. 2014  . PONV (postoperative nausea and vomiting)   . Refusal of blood transfusions as patient is Jehovah's Witness   . S/P Maze operation for atrial fibrillation 06/28/2012   Complete bilateral atrial lesion set using cryothermy ablation with oversewing of LA appendage via right minithoracotomy  . S/P mitral valve repair 06/28/2012   Complex valvuloplasty including quadrangular resection of posterior leaflet, sliding leaflet plasty, artificial Gore-tex neocord placement x2 and 73m Sorin Memo 3D ring annuloplasty via right mini thoracotomy   . SBO (small bowel  obstruction) ([place])   . Shortness of breath     Past Surgical History:  Procedure Laterality Date  . CARDIAC CATHETERIZATION N/A 07/16/2015   Procedure: Left Heart Cath and Coronary Angiography;  Surgeon: Dixie Dials, MD;  Location: [place] CV LAB;  Service: Cardiovascular;  Laterality: N/A;  . CARDIOVERSION N/A 07/04/2018   Procedure: CARDIOVERSION;  Surgeon: Thayer Headings, MD;  Location: [facility] ENDOSCOPY;  Service: Cardiovascular;  Laterality: N/A;  . CORONARY  ARTERY BYPASS GRAFT    . INTRAOPERATIVE TRANSESOPHAGEAL ECHOCARDIOGRAM N/A 06/28/2012   Procedure: INTRAOPERATIVE TRANSESOPHAGEAL ECHOCARDIOGRAM;  Surgeon: Rexene Alberts, MD;  Location: [place];  Service: Open Heart Surgery;  Laterality: N/A;  . LAPAROSCOPY N/A 05/21/2016   Procedure: LAPAROSCOPY DIAGNOSTIC, LYSIS OF ADHESION BAND;  Surgeon: Ralene Ok, MD;  Location: [place];  Service: General;  Laterality: N/A;  . LEFT AND RIGHT HEART CATHETERIZATION WITH CORONARY ANGIOGRAM N/A 05/22/2012   Procedure: LEFT AND RIGHT HEART CATHETERIZATION WITH CORONARY ANGIOGRAM;  Surgeon: Burnell Blanks, MD;  Location: [facility] CATH LAB;  Service: Cardiovascular;  Laterality: N/A;  . MINIMALLY INVASIVE MAZE PROCEDURE N/A 06/28/2012   Procedure: MINIMALLY INVASIVE MAZE PROCEDURE;  Surgeon: Rexene Alberts, MD;  Location: [place];  Service: Open Heart Surgery;  Laterality: N/A;  . MITRAL VALVE REPAIR Right 06/28/2012   Procedure: MINIMALLY INVASIVE MITRAL VALVE REPAIR (MVR);  Surgeon: Rexene Alberts, MD;  Location: [place];  Service: Open Heart Surgery;  Laterality: Right;  . OVARIAN CYST SURGERY  1988  . TEE WITHOUT CARDIOVERSION  05/19/2012   Procedure: TRANSESOPHAGEAL ECHOCARDIOGRAM (TEE);  Surgeon: Jolaine Artist, MD;  Location: [place];  Service: Cardiovascular;  Laterality: N/A;  . TUBAL LIGATION  1974  . URETHRAL STRICTURE DILATATION    . [place]       reports that she has never smoked. She has never used smokeless tobacco. She reports that she does not drink alcohol or use drugs.  Allergies  Allergen Reactions  . Amiodarone Other (See Comments)    Hyperthyroidism   . Diltiazem Other (See Comments)    Put into hospital for 3 days  . Macrobid [Nitrofurantoin Monohyd Macro] Nausea Only  . Mexiletine     Confusion, pain in the face  . Aspirin Rash    Tolerates low dose aspirin  . Naproxen Sodium Swelling and Rash  . Sulfa Drugs Cross Reactors Other (See Comments)     Allergy per mother - unknown reaction    Family History  Problem Relation Age of Onset  . Colon cancer Father      Prior to Admission medications   Medication Sig Start Date End Date Taking? Authorizing Provider  ALPRAZolam (XANAX) 0.25 MG tablet Take 0.125-0.25 mg by mouth 2 (two) times daily as needed for anxiety.    Yes [provider]  amoxicillin-clavulanate (AUGMENTIN) 875-125 MG tablet Take 1 tablet by mouth every 12 (twelve) hours. 04/18/19  Yes Kendrick, Caitlyn S, PA-C  cholecalciferol (VITAMIN D) 1000 units tablet Take 1,000 Units by mouth daily.    Yes [provider]  fluticasone (FLONASE) 50 MCG/ACT nasal spray Place 1 spray into both nostrils daily as needed for allergies.  12/08/12  Yes [provider]  Menthol, Topical Analgesic, (ICY HOT EX) Apply 1 application topically daily as needed (back pain).   Yes [provider]  methimazole (TAPAZOLE) 5 MG tablet Take 5 mg by mouth every morning.    Yes [provider]  pantoprazole (PROTONIX) 40 MG tablet Take 40 mg by mouth daily.  08/24/12  Yes Burnard Bunting, MD  Rivaroxaban (XARELTO) 15 MG TABS tablet Take 1 tablet (15 mg total) by mouth daily with supper. 03/16/19  Yes Camnitz, Will Hassell Done, MD  sotalol (BETAPACE) 80 MG tablet Take 0.5 tablets (40 mg total) by mouth 2 (two) times daily. 02/20/19  Yes Camnitz, Will Hassell Done, MD  traMADol (ULTRAM) 50 MG tablet Take 50 mg by mouth 3 (three) times daily as needed for moderate pain or severe pain.  04/16/19  Yes [provider]    Physical Exam: Vitals:   04/20/19 0723 04/20/19 0830  BP: 127/83 140/81  Pulse: (!) 116 (!) 115  Resp: 16   Temp: 98.5 F (36.9 C)   TempSrc: Oral   SpO2: 99% 98%    Constitutional: NAD, elderly frail, on room air General : Alert awake, chronically sick looking.   Eyes: PERRL, lids and conjunctivae normal ENMT: Mucous membranes are dryt. Normal dentition.  Neck: Normal, supple, no masses,  no thyromegaly Respiratory: Bilaterally clear to auscultation, no wheezing or crackles.Normal respiratory effort. No accessory muscle use.  Cardiovascular: s1/s2+,no murmurs / rubs / gallops.2+ pedal pulses. No carotid bruits.  Abdomen: Soft, Tender abdomen more on LLQ, no mass, bowel sounds present Musculoskeletal: no clubbing/cyanosis. No joint deformity upper and lower extremities.  Skin: No rashes, lesions, ulcers.No induration Neurologic:  Alert and oriented, able to move extremities well- grossly non focal,sensation intact Psychiatric: Normal judgment and insight. Normal mood.   Foley Catheter:  Labs on Admission: I have personally reviewed following labs and imaging studies  CBC: Recent Labs  Lab 04/18/19 0958 04/20/19 0835  WBC 13.8* 6.1  NEUTROABS 11.7* 4.9  HGB 13.6 8.9*  HCT 42.1 28.3*  MCV 91.5 94.3  PLT 208 323*   Basic Metabolic Panel: Recent Labs  Lab 04/18/19 0958 04/20/19 0835  NA 137 139  K 4.1 2.4*  CL 102 115*  CO2 27 20*  GLUCOSE 119* 79  BUN 14 7*  CREATININE 0.70 0.43*  CALCIUM 10.1 6.3*  MG  --  1.3*   GFR: Estimated Creatinine Clearance: 40.9 mL/min (A) (by C-G formula based on SCr of 0.43 mg/dL (L)). Liver Function Tests: Recent Labs  Lab 04/18/19 0958 04/20/19 0835  AST 45* 16  ALT 32 16  ALKPHOS 90 52  BILITOT 1.0 0.7  PROT 7.3 4.6*  ALBUMIN 4.2 2.5*   Recent Labs  Lab 04/18/19 0958  LIPASE 29   No results for input(s): AMMONIA in the last 168 hours. Coagulation Profile: No results for input(s): INR, PROTIME in the last 168 hours. Cardiac Enzymes: No results for input(s): CKTOTAL, CKMB, CKMBINDEX, TROPONINI in the last 168 hours. BNP (last 3 results) No results for input(s): PROBNP in the last 8760 hours. HbA1C: No results for input(s): HGBA1C in the last 72 hours. CBG: No results for input(s): GLUCAP in the last 168 hours. Lipid Profile: No results for input(s): CHOL, HDL, LDLCALC, TRIG, CHOLHDL, LDLDIRECT in the last  72 hours. Thyroid Function Tests: Recent Labs    04/20/19 0835  TSH 1.323   Anemia Panel: No results for input(s): VITAMINB12, FOLATE, FERRITIN, TIBC, IRON, RETICCTPCT in the last 72 hours. Urine analysis:    Component Value Date/Time   COLORURINE STRAW (A) 07/11/2018 2155   APPEARANCEUR CLEAR 07/11/2018 2155   LABSPEC 1.014 07/11/2018 2155   PHURINE 7.0 07/11/2018 2155   GLUCOSEU NEGATIVE 07/11/2018 2155   HGBUR MODERATE (A) 07/11/2018 2155  BILIRUBINUR NEGATIVE 07/11/2018 2155   BILIRUBINUR negative 11/02/2014 1336   KETONESUR NEGATIVE 07/11/2018 2155   PROTEINUR NEGATIVE 07/11/2018 2155   UROBILINOGEN 0.2 11/02/2014 1336   UROBILINOGEN 0.2 02/10/2013 1615   NITRITE NEGATIVE 07/11/2018 2155   LEUKOCYTESUR NEGATIVE 07/11/2018 2155    Radiological Exams on Admission: DG Chest 1 View  Result Date: 04/20/2019 CLINICAL DATA:  Shortness of breath. EXAM: CHEST  1 VIEW COMPARISON:  11/20/2018 and chest CT 12/05/2017 FINDINGS: Lungs are adequately inflated without focal airspace consolidation or effusion. Emphysematous disease is present. Evidence of patient's known moderate size right thyroid goiter. Cardiomediastinal silhouette and remainder of the exam is unchanged. IMPRESSION: 1.  No acute cardiopulmonary disease. 2.  COPD. 3.  Known right thyroid goiter. Electronically Signed   By: Marin Olp M.D.   On: 04/20/2019 08:49     Assessment/Plan  Hypokalemia-suspect due to poor oral intake. will repelte po and iv, monitor in tele Hypomagnesemia: due to poor Oral intake, will repelte Hypocalcemia: add calcium supplement, check ptc, vit d  Anemia acute: unclear cause: Hb today at 8.9 from 13.9 12/23, check FOBT,Anemia panel, monitor H/H.  Patient on Xarelto and will be held until we are sure that she does not have any occult GI bleeding.  Add PPI. Also checking at abd w contrast.  Diverticulitis in recent CT 12/23 history of diverticulitis-last Colonoscopy in 2013, followed by  [facility] last seen in 2019.  Patient is unable to tolerate oral antibiotics at home reports her stomach is upset and has not been eating well.  We will switch her to IV Zosyn.  Given significant tenderness will obtain CT abdomen pelvis to r/o abscess. Will keep in IVF overnight-reasses in am 2/2 CHF.  Shortness of breath: cxr clear, rapid covid ag neg- keep as PUI until COVID PCR is back.  Headache: supportive care. If persistent, consider ct head w/o.  Anxiety-cont home xanax  Atrial fibrillation/ with severe MR 04/2012-s/p MV repair and Maze procedure: cont her sotalol, hold xarelto for now given significant drop in hemoglobin.   CAD-last cath 2017, troponin normal at 5  Chronic systolic CHF LVEF 35% in 45/6256: Chest x-ray clear, BNP 224.  Add gentle IV fluids for poor intake, avoid fluid overload.  Patient is Jehovah's Witness  Hyperlipidemia unable to tolerate Crestor  Hyperthyroidism continue on methimazole  There is no height or weight on file to calculate BMI.   Severity of Illness: The appropriate patient status for this patient is OBSERVATION. Observation status is judged to be reasonable and necessary in order to provide the required intensity of service to ensure the patient's safety. The patient's presenting symptoms, physical exam findings, and initial radiographic and laboratory data in the context of their medical condition is felt to place them at decreased risk for further clinical deterioration. Furthermore, it is anticipated that the patient will be medically stable for discharge from the hospital within 2 midnights of admission. The following factors support the patient status of observation.    " The patient's presenting symptoms include generalized weakness unable to tolerate oral antibiotics " The physical exam findings include abdominal tenderness " The initial radiographic and laboratory data are 12/3. 12/25    DVT prophylaxis:  SCD Code Status: DNR - per  patient's request. Family Communication: Admission, patients condition and plan of care including tests being ordered have been discussed with the patient and her daughter/son in law  over the phone who indicate understanding and agree with the plan and Code Status.  Consults called:   Antonieta Pert MD [facility] Pager [phone]  If 7PM-7AM, please contact night-coverage [email or web address]  04/20/2019, 12:31 PM

## 2019-04-21 ENCOUNTER — Observation Stay (HOSPITAL_BASED_OUTPATIENT_CLINIC_OR_DEPARTMENT_OTHER): Payer: PPO

## 2019-04-21 DIAGNOSIS — Z681 Body mass index (BMI) 19 or less, adult: Secondary | ICD-10-CM | POA: Diagnosis not present

## 2019-04-21 DIAGNOSIS — R519 Headache, unspecified: Secondary | ICD-10-CM | POA: Diagnosis present

## 2019-04-21 DIAGNOSIS — M25519 Pain in unspecified shoulder: Secondary | ICD-10-CM | POA: Diagnosis present

## 2019-04-21 DIAGNOSIS — Z8679 Personal history of other diseases of the circulatory system: Secondary | ICD-10-CM

## 2019-04-21 DIAGNOSIS — F419 Anxiety disorder, unspecified: Secondary | ICD-10-CM | POA: Diagnosis present

## 2019-04-21 DIAGNOSIS — I428 Other cardiomyopathies: Secondary | ICD-10-CM | POA: Diagnosis present

## 2019-04-21 DIAGNOSIS — I2510 Atherosclerotic heart disease of native coronary artery without angina pectoris: Secondary | ICD-10-CM | POA: Diagnosis present

## 2019-04-21 DIAGNOSIS — I110 Hypertensive heart disease with heart failure: Secondary | ICD-10-CM | POA: Diagnosis present

## 2019-04-21 DIAGNOSIS — I10 Essential (primary) hypertension: Secondary | ICD-10-CM | POA: Diagnosis not present

## 2019-04-21 DIAGNOSIS — K5900 Constipation, unspecified: Secondary | ICD-10-CM | POA: Diagnosis present

## 2019-04-21 DIAGNOSIS — E876 Hypokalemia: Secondary | ICD-10-CM | POA: Diagnosis present

## 2019-04-21 DIAGNOSIS — Z66 Do not resuscitate: Secondary | ICD-10-CM | POA: Diagnosis present

## 2019-04-21 DIAGNOSIS — Z20828 Contact with and (suspected) exposure to other viral communicable diseases: Secondary | ICD-10-CM | POA: Diagnosis present

## 2019-04-21 DIAGNOSIS — G8929 Other chronic pain: Secondary | ICD-10-CM | POA: Diagnosis present

## 2019-04-21 DIAGNOSIS — Z789 Other specified health status: Secondary | ICD-10-CM

## 2019-04-21 DIAGNOSIS — E785 Hyperlipidemia, unspecified: Secondary | ICD-10-CM | POA: Diagnosis present

## 2019-04-21 DIAGNOSIS — R0602 Shortness of breath: Secondary | ICD-10-CM

## 2019-04-21 DIAGNOSIS — M542 Cervicalgia: Secondary | ICD-10-CM | POA: Diagnosis present

## 2019-04-21 DIAGNOSIS — K219 Gastro-esophageal reflux disease without esophagitis: Secondary | ICD-10-CM | POA: Diagnosis present

## 2019-04-21 DIAGNOSIS — I480 Paroxysmal atrial fibrillation: Secondary | ICD-10-CM | POA: Diagnosis present

## 2019-04-21 DIAGNOSIS — I5043 Acute on chronic combined systolic (congestive) and diastolic (congestive) heart failure: Secondary | ICD-10-CM | POA: Diagnosis present

## 2019-04-21 DIAGNOSIS — D649 Anemia, unspecified: Secondary | ICD-10-CM | POA: Diagnosis present

## 2019-04-21 DIAGNOSIS — K5732 Diverticulitis of large intestine without perforation or abscess without bleeding: Secondary | ICD-10-CM | POA: Diagnosis present

## 2019-04-21 DIAGNOSIS — E0500 Thyrotoxicosis with diffuse goiter without thyrotoxic crisis or storm: Secondary | ICD-10-CM | POA: Diagnosis present

## 2019-04-21 DIAGNOSIS — R1310 Dysphagia, unspecified: Secondary | ICD-10-CM | POA: Diagnosis present

## 2019-04-21 DIAGNOSIS — E43 Unspecified severe protein-calorie malnutrition: Secondary | ICD-10-CM | POA: Diagnosis present

## 2019-04-21 DIAGNOSIS — I4891 Unspecified atrial fibrillation: Secondary | ICD-10-CM

## 2019-04-21 DIAGNOSIS — K5792 Diverticulitis of intestine, part unspecified, without perforation or abscess without bleeding: Secondary | ICD-10-CM

## 2019-04-21 DIAGNOSIS — Z9889 Other specified postprocedural states: Secondary | ICD-10-CM

## 2019-04-21 LAB — PHOSPHORUS: Phosphorus: 3 mg/dL (ref 2.5–4.6)

## 2019-04-21 LAB — COMPREHENSIVE METABOLIC PANEL
ALT: 19 U/L (ref 0–44)
AST: 21 U/L (ref 15–41)
Albumin: 3.9 g/dL (ref 3.5–5.0)
Alkaline Phosphatase: 66 U/L (ref 38–126)
Anion gap: 9 (ref 5–15)
BUN: 9 mg/dL (ref 8–23)
CO2: 23 mmol/L (ref 22–32)
Calcium: 9.4 mg/dL (ref 8.9–10.3)
Chloride: 103 mmol/L (ref 98–111)
Creatinine, Ser: 0.5 mg/dL (ref 0.44–1.00)
GFR calc Af Amer: >60 mL/min (ref >60)
GFR calc non Af Amer: >60 mL/min (ref >60)
Glucose, Bld: 97 mg/dL (ref 70–99)
Potassium: 3.8 mmol/L (ref 3.5–5.1)
Sodium: 135 mmol/L (ref 135–145)
Total Bilirubin: 1.8 mg/dL — ABNORMAL HIGH (ref 0.3–1.2)
Total Protein: 7.1 g/dL (ref 6.5–8.1)

## 2019-04-21 LAB — CBC
HCT: 40.2 % (ref 36.0–46.0)
Hemoglobin: 13 g/dL (ref 12.0–15.0)
MCH: 30.1 pg (ref 26.0–34.0)
MCHC: 32.3 g/dL (ref 30.0–36.0)
MCV: 93.1 fL (ref 80.0–100.0)
Platelets: 207 10*3/uL (ref 150–400)
RBC: 4.32 MIL/uL (ref 3.87–5.11)
RDW: 13.8 % (ref 11.5–15.5)
WBC: 9.9 10*3/uL (ref 4.0–10.5)

## 2019-04-21 LAB — HEMOGLOBIN AND HEMATOCRIT, BLOOD
HCT: 38.5 % (ref 36.0–46.0)
HCT: 37.6 % (ref 36.0–46.0)
Hemoglobin: 12.7 g/dL (ref 12.0–15.0)
Hemoglobin: 12.3 g/dL (ref 12.0–15.0)

## 2019-04-21 LAB — ECHOCARDIOGRAM COMPLETE
Height: 66.5 in
Weight: 1488.55 oz

## 2019-04-21 LAB — PROTIME-INR
INR: 1.5 — ABNORMAL HIGH (ref 0.8–1.2)
Prothrombin Time: 18.4 seconds — ABNORMAL HIGH (ref 11.4–15.2)

## 2019-04-21 LAB — APTT: aPTT: 37 seconds — ABNORMAL HIGH (ref 24–36)

## 2019-04-21 LAB — MAGNESIUM: Magnesium: 2.3 mg/dL (ref 1.7–2.4)

## 2019-04-21 MED ORDER — RIVAROXABAN 15 MG PO TABS
15.0000 mg | ORAL_TABLET | Freq: Every day | ORAL | Status: DC
  Administered 2019-04-21 – 2019-04-25 (×5): 15 mg via ORAL
  Filled 2019-04-21 (×5): qty 1

## 2019-04-21 MED ORDER — FUROSEMIDE 10 MG/ML IJ SOLN
20.0000 mg | Freq: Once | INTRAMUSCULAR | Status: AC
  Administered 2019-04-21: 21:00:00 20 mg via INTRAVENOUS
  Filled 2019-04-21: qty 2

## 2019-04-21 NOTE — Progress Notes (Signed)
PROGRESS NOTE    [name] [name]  ZOX:[id number] DOB: [birth year] DOA: 04/20/2019 PCP: Burnard Bunting, MD   Brief Narrative:  HPI per Dr. Antonieta Pert on 04/20/2019  [name] [name] is a 78 y.o. female with medical history significant for atrial fibrillation/ H/o MVP, with severe MR 04/2012-s/p MV repair and Maze procedure, CAD-last cath 8119, systolic CHF LVEF 14% in 78/2956, chronic anemia, anxiety disorder, hyperlipidemia unable to tolerate Crestor history of diverticulitis-last Colonoscopy in 2013, followed by [facility] last seen in 2019 comes to ER for evaluation of shortness of breath abdominal pain and headache.  Patient was seen on 12/23 at urgent care for abdominal pain CT showed 'showed possible mild diverticulitis, with no other acute pathology (an old L3 endplate compression fracture was noted, seen on prior imaging), placed on Augmentin and discharged home.  She has been taking Augmentin but has been having upset the stomach.  Complains of headache  throbbing in nature, also has Sob and DOE along with chills. Currently she complains of abdominal pain, headache - frontal area near Eye and denies chest pain.  In ED VSS- Bp stable 120-140, HR in 110s.  Rapid Covid antigen negative, PCR pending, labs showed potassium 2.4 mag 1.3 bicarb 20 WBC 6.1 hemoglobin 8.9 from 13.9 g 12/23, calcium 6.3.   CXR "No acute cardiopulmonary disease. 2.  COPD. 3.  Known right thyroid goiter".  Lactic acid pending, patient was given calcium chloride 1 g, Ativan 0.5, magnesium 2 g, potassium 10 Meq IV x2, NS 500 ml, compazine and admission was requested.  **Interim History Patient states that she still feels a little short of breath.  Her main complaint yesterday was her headache which is improved.  No nausea or vomiting.  Has not been out of bed but states that she cannot sleep flat and has sleep on 2 pillows.  Very low volume overloaded and has stopped fluids and given gentle diuresis with IV Lasix 20 mg x  1.  Repeat echo showed slightly worsening EF and grade 2 diastolic dysfunction.  In May need cardiology involvement if not improving.  Assessment & Plan:   Principal Problem:   Hypokalemia Active Problems:   Diverticulitis   Hypertension   Anxiety   Atrial fibrillation (HCC)   Status post Maze operation for atrial fibrillation   S/P MVR (mitral valve repair)   Hypomagnesemia   Anemia   Patient is Jehovah's Witness  Electrolyte Abnormalities, improved   Hypokalemia-suspect due to poor oral intake. will repelte po and iv, monitor in tele, improved after repletion as K+ is now 3.8 Hypomagnesemia: due to poor Oral intake, will repelte and is improved to 2.3 Hypocalcemia: add calcium supplement, check ptc, vit d; improved and Ca2+ is now 9.4  Normocytic Anemia Acute: unclear cause but ? Spurious result: Hb yesterday at 8.9 from 13.9 12/23, check FOBT,Anemia panel, monitor H/H.  Patient on Xarelto and will be held until we are sure that she does not have any occult GI bleeding but I do not fel she does given stable Hb .  Add PPI. Also checking at abd w contrast. -Repeat CBC showed Hb/Hct of 13.0/40.2 and Hb/Hct 12.7/38.5 -Continue to Monitor for S/Sx of Bleeding  -Repeat CBC in AM and will resume Xarelto tomorrow   Diverticulitis in recent CT 12/23 history of diverticulitis -last Colonoscopy in 2013, followed by [facility] last seen in 2019.  Patient is unable to tolerate oral antibiotics at home reports her stomach is upset and has not been  eating well. -We will switch her to IV Zosyn for now.  Given significant tenderness will obtain CT abdomen pelvis to r/o abscess. IVF now stopped-reasses in am 2/2 CHF -Placed ona Clear Liquid Diet but will now advance to FULL and soft in the AM if able to tolerate  -CT Abdomen and Pelvis showed "Compared with the recent CT, no acute findings or evidence of complicated diverticulitis. Mild sigmoid colon wall thickening appears unchanged. No evidence of  abscess or bowel obstruction. New bladder distention without wall thickening, surrounding distension or hydronephrosis. Minimally increased right pleural effusion and associated right basilar atelectasis. Aortic Atherosclerosis."  Shortness of breath and ? Related to CHF vs. Other Etiology such as Right Pleural Effusion   -CXR clear, rapid covid ag Negative and PCR Negative -BNP elevated and was 224.5 -Checked ECHO as below  -Has orthopnea and sleeps on 2 pillows -Give 20 mg IV Lasix today  -Repeat CXR -Strict I's and O's and Daily Weights  -Will need Home Ambulatory Screen prior to D/C  -PT/OT to evaluate   Headache -C/w Supportive care. If persistent, consider ct head w/o.  Anxiety cont home xanax  Atrial fibrillation/ with severe MR 04/2012-s/p MV repair and Maze procedure: cont her Sotalol, hold xarelto for now given significant drop in hemoglobin but will resume Tomorrow  -Patient's heart rates were slightly on the faster side but will be given sotalol no tachycardia monitor telemetry  CAD-last cath 2017, troponin normal at 5  Acute on Chronic systolic and Diastolic CHF LVEF 02% in 63/7858: -Chest x-ray clear, BNP 224. Admitting Physician added gentle IV fluids for poor intake but now stopped and given IV Lasix 20 mg x1 given concern for volume overload  -Avoid fluid overload. -Repeat ECHO; showed worsening EF of 30-35% and Grade 2 DD with no LVH -Continue to Monitor for S/Sx of Volume Overload -She is +895 mL since admission -Continue to Monitor for S/Sx of Volume Overload  -May need Cardiology involvement if not improving   Patient is Jehovah's Witness  Hyperlipidemia  unable to tolerate Crestor  Hyperthyroidism  continue on methimazole  Hyperblirubinemia -Slightly worsened as T Bil is now 1.8 -Continue to Monitor and Trend -Repeat CMP in AM   GOC, DNR poA  DVT prophylaxis: SCDs; Resume Xarelto in the AM  Code Status: DO NOT RESUSCITATE Family  Communication: No family present at bedside Disposition Plan: Pending PT/OT Evaluation  Consultants:   None   Procedures:  ECHOCardiogram Left ventricular ejection fraction, by visual estimation, is 30 to 35%. The left ventricle has severely decreased function. There is no left ventricular hypertrophy.  2. Left ventricular diastolic parameters are consistent with Grade II diastolic dysfunction (pseudonormalization).  3. The left ventricle demonstrates global hypokinesis.  4. Global right ventricle has normal systolic function.The right ventricular size is normal. No increase in right ventricular wall thickness.  5. Left atrial size was normal.  6. Right atrial size was severely dilated.  7. The mitral valve is normal in structure. No evidence of mitral valve regurgitation. No evidence of mitral stenosis.  8. MV repair, mean gradient 79mHg.  9. The tricuspid valve is normal in structure. 10. Aortic valve regurgitation is moderate. 11. The aortic valve is tricuspid. Aortic valve regurgitation is moderate. No evidence of aortic valve sclerosis or stenosis. 12. The pulmonic valve was normal in structure. Pulmonic valve regurgitation is not visualized. 13. The inferior vena cava is normal in size with greater than 50% respiratory variability, suggesting right atrial pressure of 3  mmHg.  FINDINGS  Left Ventricle: Left ventricular ejection fraction, by visual estimation, is 30 to 35%. The left ventricle has severely decreased function. The left ventricle demonstrates global hypokinesis. There is no left ventricular hypertrophy. Left ventricular  diastolic parameters are consistent with Grade II diastolic dysfunction (pseudonormalization). Normal left atrial pressure.  Right Ventricle: The right ventricular size is normal. No increase in right ventricular wall thickness. Global RV systolic function is has normal systolic function.  Left Atrium: Left atrial size was normal in size.  Right  Atrium: Right atrial size was severely dilated  Pericardium: There is no evidence of pericardial effusion.  Mitral Valve: The mitral valve is normal in structure. No evidence of mitral valve regurgitation. No evidence of mitral valve stenosis by observation. MV peak gradient, 7.0 mmHg. MV repair, mean gradient 53mHg.  Tricuspid Valve: The tricuspid valve is normal in structure. Tricuspid valve regurgitation is trivial.  Aortic Valve: The aortic valve is tricuspid. . There is moderate thickening and moderate calcification of the aortic valve. Aortic valve regurgitation is moderate. The aortic valve is structurally normal, with no evidence of sclerosis or stenosis. There  is moderate thickening of the aortic valve. There is moderate calcification of the aortic valve.  Pulmonic Valve: The pulmonic valve was normal in structure. Pulmonic valve regurgitation is not visualized. Pulmonic regurgitation is not visualized.  Aorta: The aortic root, ascending aorta and aortic arch are all structurally normal, with no evidence of dilitation or obstruction.  Venous: The inferior vena cava is normal in size with greater than 50% respiratory variability, suggesting right atrial pressure of 3 mmHg.  IAS/Shunts: No atrial level shunt detected by color flow Doppler. There is no evidence of a patent foramen ovale. No ventricular septal defect is seen or detected. There is no evidence of an atrial septal defect.    LEFT VENTRICLE PLAX 2D LVIDd:         5.00 cm LVIDs:         4.10 cm LV PW:         1.00 cm LV IVS:        1.00 cm LVOT diam:     1.60 cm LV SV:         44 ml LV SV Index:   31.74 LVOT Area:     2.01 cm   LV Volumes (MOD) LV area d, A4C:    36.60 cm LV area s, A4C:    31.10 cm LV major d, A4C:   8.34 cm LV major s, A4C:   8.23 cm LV vol d, MOD A4C: 132.0 ml LV vol s, MOD A4C: 104.0 ml LV SV MOD A4C:     132.0 ml  RIGHT VENTRICLE          IVC RV Basal diam:  3.30 cm  IVC  diam: 1.80 cm TAPSE (M-mode): 1.5 cm  LEFT ATRIUM         Index      RIGHT ATRIUM           Index LA diam:    3.80 cm 2.62 cm/m RA Area:     21.50 cm                                RA Volume:   65.80 ml  45.30 ml/m  AORTIC VALVE LVOT Vmax:   63.30 cm/s LVOT Vmean:  39.800 cm/s LVOT VTI:    0.083 m  AORTA Ao Root diam: 3.00 cm Ao Asc diam:  3.00 cm  MITRAL VALVE MV Peak grad: 7.0 mmHg  SHUNTS MV Mean grad: 3.0 mmHg  Systemic VTI:  0.08 m MV Vmax:      1.32 m/s  Systemic Diam: 1.60 cm MV Vmean:     88.6 cm/s MV VTI:       0.25 m   Antimicrobials:  Anti-infectives (From admission, onward)   Start     Dose/Rate Route Frequency Ordered Stop   04/20/19 2200  piperacillin-tazobactam (ZOSYN) IVPB 3.375 g     3.375 g 12.5 mL/hr over 240 Minutes Intravenous Every 8 hours 04/20/19 1314     04/20/19 1315  piperacillin-tazobactam (ZOSYN) IVPB 3.375 g     3.375 g 100 mL/hr over 30 Minutes Intravenous  Once 04/20/19 1314 04/20/19 1540     Subjective: Seen and examined at bedside and states that she had a headache yesterday which is improved.  Feels a little short of breath.  Has not been out of bed.  No nausea or vomiting.  No other concerns or complaints at this time.  Denies any chest pain, lightheadedness or dizziness but heart rate was on the faster side.  Objective: Vitals:   04/21/19 0100 04/21/19 0130 04/21/19 0258 04/21/19 0512  BP: (!) 149/75 (!) 142/92 (!) 141/80 (!) 141/82  Pulse: (!) 117 (!) 116 (!) 119 (!) 119  Resp: _0 Temp:   (!) 97.3 F (36.3 C) 98.2 F (36.8 C)  TempSrc:   Oral Oral  SpO2: 95% 95% 96% 98%  Weight:   42.2 kg   Height:   5' 6.5" (1.689 m)     Intake/Output Summary (Last 24 hours) at 04/21/2019 2706 Last data filed at 04/21/2019 2376 Gross per 24 hour  Intake 613.97 ml  Output -  Net 613.97 ml   Filed Weights   04/21/19 0258  Weight: 42.2 kg   Examination: Physical Exam:  Constitutional: Thin elderly Caucasian female  in  NAD and appears calm Eyes: Lids and conjunctivae normal, sclerae anicteric  ENMT: External Ears, Nose appear normal. Grossly normal hearing. Mucous membranes are moist. Neck: Appears normal, supple, no cervical masses, normal ROM, no appreciable thyromegaly; Slight JVD Respiratory: Diminished to auscultation bilaterally with coarse breath sounds, no wheezing, rales, rhonchi or crackles. Normal respiratory effort and patient is not tachypenic. No accessory muscle use. Unlabored breathing   Cardiovascular: Irregularly Irregular and Tachycaardic, no murmurs / rubs / gallops. S1 and S2 auscultated. Trace to 1+ LE edema.  Abdomen: Soft, mildly-tender, non-distended. Bowel sounds positive x4.  GU: Deferred. Musculoskeletal: No clubbing / cyanosis of digits/nails. No joint deformity upper and lower extremities. Skin: No rashes, lesions, ulcers on a limited skin evaluation. No induration; Warm and dry.  Neurologic: CN 2-12 grossly intact with no focal deficits. Romberg sign and cerebellar reflexes not assessed.  Psychiatric: Normal judgment and insight. Alert and oriented x 3. Normal mood and appropriate affect.   Data Reviewed: I have personally reviewed following labs and imaging studies  CBC: Recent Labs  Lab 04/18/19 0958 04/20/19 0835  WBC 13.8* 6.1  NEUTROABS 11.7* 4.9  HGB 13.6 8.9*  HCT 42.1 28.3*  MCV 91.5 94.3  PLT 208 283*   Basic Metabolic Panel: Recent Labs  Lab 04/18/19 0958 04/20/19 0835 04/21/19 0509  NA 137 139 135  K 4.1 2.4* 3.8  CL 102 115* 103  CO2 27 20* 23  GLUCOSE 119* 79 97  BUN 14 7*  9  CREATININE 0.70 0.43* 0.50  CALCIUM 10.1 6.3* 9.4  MG  --  1.3*  --    GFR: Estimated Creatinine Clearance: 39.2 mL/min (by C-G formula based on SCr of 0.5 mg/dL). Liver Function Tests: Recent Labs  Lab 04/18/19 0958 04/20/19 0835 04/21/19 0509  AST 45* 16 21  ALT 32 16 19  ALKPHOS 90 52 66  BILITOT 1.0 0.7 1.8*  PROT 7.3 4.6* 7.1  ALBUMIN 4.2 2.5* 3.9    Recent Labs  Lab 04/18/19 0958  LIPASE 29   No results for input(s): AMMONIA in the last 168 hours. Coagulation Profile: Recent Labs  Lab 04/21/19 0509  INR 1.5*   Cardiac Enzymes: No results for input(s): CKTOTAL, CKMB, CKMBINDEX, TROPONINI in the last 168 hours. BNP (last 3 results) No results for input(s): PROBNP in the last 8760 hours. HbA1C: No results for input(s): HGBA1C in the last 72 hours. CBG: No results for input(s): GLUCAP in the last 168 hours. Lipid Profile: No results for input(s): CHOL, HDL, LDLCALC, TRIG, CHOLHDL, LDLDIRECT in the last 72 hours. Thyroid Function Tests: Recent Labs    04/20/19 0835  TSH 1.323   Anemia Panel: Recent Labs    04/20/19 1326  VITAMINB12 512  FOLATE 20.6  FERRITIN 154  TIBC 289  IRON 54   Sepsis Labs: Recent Labs  Lab 04/18/19 0958 04/20/19 1326  LATICACIDVEN 1.1 1.7    Recent Results (from the past 240 hour(s))  SARS CORONAVIRUS 2 (TAT 6-24 HRS) Nasopharyngeal Nasopharyngeal Swab     Status: None   Collection Time: 04/20/19  1:26 PM   Specimen: Nasopharyngeal Swab  Result Value Ref Range Status   SARS Coronavirus 2 NEGATIVE NEGATIVE Final    Comment: (NOTE) SARS-CoV-2 target nucleic acids are NOT DETECTED. The SARS-CoV-2 RNA is generally detectable in upper and lower respiratory specimens during the acute phase of infection. Negative results do not preclude SARS-CoV-2 infection, do not rule out co-infections with other pathogens, and should not be used as the sole basis for treatment or other patient management decisions. Negative results must be combined with clinical observations, patient history, and epidemiological information. The expected result is Negative. Fact Sheet for Patients: SugarRoll.be Fact Sheet for Healthcare Providers: https://www.woods-mathews.com/ This test is not yet approved or cleared by the [place] FDA and  has been authorized for  detection and/or diagnosis of SARS-CoV-2 by FDA under an Emergency Use Authorization (EUA). This EUA will remain  in effect (meaning this test can be used) for the duration of the COVID-19 declaration under Section 56 4(b)(1) of the Act, 21 U.S.C. section 360bbb-3(b)(1), unless the authorization is terminated or revoked sooner. Performed at [facility], [place] [address] [place], [place] [address]     Radiology Studies: CT ABDOMEN PELVIS WO CONTRAST  Result Date: 04/20/2019 CLINICAL DATA:  Shortness of breath with abdominal pain and headache since yesterday. Diverticulitis complications suspected. EXAM: CT ABDOMEN AND PELVIS WITHOUT CONTRAST TECHNIQUE: Multidetector CT imaging of the abdomen and pelvis was performed following the standard protocol without IV contrast. COMPARISON:  Abdominopelvic CT 04/18/2019 and 03/12/2019. FINDINGS: Lower chest: Slight enlargement of dependent right pleural effusion with increased right basilar atelectasis. Stable cardiomegaly and aortic atherosclerosis. Hepatobiliary: The liver appears unremarkable as imaged in the noncontrast state. No evidence of gallstones, gallbladder wall thickening or biliary dilatation. Pancreas: Unremarkable. No pancreatic ductal dilatation or surrounding inflammatory changes. Spleen: Normal in size without focal abnormality. Adrenals/Urinary Tract: Both adrenal glands appear normal. No focal renal abnormalities  are identified on noncontrast imaging. There are bilateral extrarenal pelves. No evidence of urinary tract calculus or hydronephrosis. New at least moderate bladder distention without wall thickening or surrounding inflammation. Stomach/Bowel: The stomach, small bowel and proximal colon appear stable without significant findings. There are diverticular changes throughout the descending and sigmoid colon. There is mild sigmoid colon wall thickening, but no extraluminal fluid collection. Enteric contrast was administered although  has not yet passed through the sigmoid colon. No evidence of bowel obstruction. Vascular/Lymphatic: There are no enlarged abdominal or pelvic lymph nodes. Aortic and branch vessel atherosclerosis. Reproductive: The uterus and ovaries appear normal. No adnexal mass. Other: No appreciable ascites, focal extraluminal fluid or air collections. Musculoskeletal: No acute or significant osseous findings. Stable superior endplate compression deformity at L3 and mild lumbar spondylosis. IMPRESSION: 1. Compared with the recent CT, no acute findings or evidence of complicated diverticulitis. Mild sigmoid colon wall thickening appears unchanged. No evidence of abscess or bowel obstruction. 2. New bladder distention without wall thickening, surrounding distension or hydronephrosis. 3. Minimally increased right pleural effusion and associated right basilar atelectasis. 4.  Aortic Atherosclerosis (ICD10-I70.0). Electronically Signed   By: Richardean Sale M.D.   On: 04/20/2019 19:17   DG Chest 1 View  Result Date: 04/20/2019 CLINICAL DATA:  Shortness of breath. EXAM: CHEST  1 VIEW COMPARISON:  11/20/2018 and chest CT 12/05/2017 FINDINGS: Lungs are adequately inflated without focal airspace consolidation or effusion. Emphysematous disease is present. Evidence of patient's known moderate size right thyroid goiter. Cardiomediastinal silhouette and remainder of the exam is unchanged. IMPRESSION: 1.  No acute cardiopulmonary disease. 2.  COPD. 3.  Known right thyroid goiter. Electronically Signed   By: Marin Olp M.D.   On: 04/20/2019 08:49   Scheduled Meds: . cholecalciferol  1,000 Units Oral Daily  . magnesium oxide  400 mg Oral BID  . methimazole  5 mg Oral Daily  . pantoprazole (PROTONIX) IV  40 mg Intravenous Q24H  . sotalol  40 mg Oral BID   Continuous Infusions: . 0.9 % NaCl with KCl 20 mEq / L Stopped (04/21/19 0617)  . piperacillin-tazobactam (ZOSYN)  IV 3.375 g (04/21/19 0617)    LOS: 0 days   Kerney Elbe, DO [facility] PAGER is on AMION  If 7PM-7AM, please contact night-coverage [email or web address]

## 2019-04-21 NOTE — Plan of Care (Signed)
Problem: Education: Goal: Knowledge of General Education information will improve Description Including pain rating scale, medication(s)/side effects and non-pharmacologic comfort measures Outcome: Progressing   Problem: Health Behavior/Discharge Planning: Goal: Ability to manage health-related needs will improve Outcome: Progressing   Problem: Clinical Measurements: Goal: Respiratory complications will improve Outcome: Progressing Goal: Cardiovascular complication will be avoided Outcome: Progressing   Problem: Coping: Goal: Level of anxiety will decrease Outcome: Progressing   Problem: Pain Managment: Goal: General experience of comfort will improve Outcome: Progressing   Problem: Safety: Goal: Ability to remain free from injury will improve Outcome: Progressing   Problem: Skin Integrity: Goal: Risk for impaired skin integrity will decrease Outcome: Progressing   

## 2019-04-22 ENCOUNTER — Inpatient Hospital Stay (HOSPITAL_COMMUNITY): Payer: PPO

## 2019-04-22 LAB — COMPREHENSIVE METABOLIC PANEL
ALT: 16 U/L (ref 0–44)
AST: 19 U/L (ref 15–41)
Albumin: 3.9 g/dL (ref 3.5–5.0)
Alkaline Phosphatase: 62 U/L (ref 38–126)
Anion gap: 15 (ref 5–15)
BUN: 9 mg/dL (ref 8–23)
CO2: 27 mmol/L (ref 22–32)
Calcium: 9.4 mg/dL (ref 8.9–10.3)
Chloride: 93 mmol/L — ABNORMAL LOW (ref 98–111)
Creatinine, Ser: 0.68 mg/dL (ref 0.44–1.00)
GFR calc Af Amer: >60 mL/min (ref >60)
GFR calc non Af Amer: >60 mL/min (ref >60)
Glucose, Bld: 98 mg/dL (ref 70–99)
Potassium: 3.9 mmol/L (ref 3.5–5.1)
Sodium: 135 mmol/L (ref 135–145)
Total Bilirubin: 1.2 mg/dL (ref 0.3–1.2)
Total Protein: 7 g/dL (ref 6.5–8.1)

## 2019-04-22 LAB — PTH, INTACT AND CALCIUM
Calcium, Total (PTH): 8.9 mg/dL (ref 8.7–10.3)
PTH: 35 pg/mL (ref 15–65)

## 2019-04-22 LAB — CBC WITH DIFFERENTIAL/PLATELET
Abs Immature Granulocytes: 0.02 10*3/uL (ref 0.00–0.07)
Basophils Absolute: 0.1 10*3/uL (ref 0.0–0.1)
Basophils Relative: 1 %
Eosinophils Absolute: 0.2 10*3/uL (ref 0.0–0.5)
Eosinophils Relative: 2 %
HCT: 42.6 % (ref 36.0–46.0)
Hemoglobin: 13.7 g/dL (ref 12.0–15.0)
Immature Granulocytes: 0 %
Lymphocytes Relative: 28 %
Lymphs Abs: 2.4 10*3/uL (ref 0.7–4.0)
MCH: 29.4 pg (ref 26.0–34.0)
MCHC: 32.2 g/dL (ref 30.0–36.0)
MCV: 91.4 fL (ref 80.0–100.0)
Monocytes Absolute: 1 10*3/uL (ref 0.1–1.0)
Monocytes Relative: 11 %
Neutro Abs: 5 10*3/uL (ref 1.7–7.7)
Neutrophils Relative %: 58 %
Platelets: 220 10*3/uL (ref 150–400)
RBC: 4.66 MIL/uL (ref 3.87–5.11)
RDW: 13.7 % (ref 11.5–15.5)
WBC: 8.6 10*3/uL (ref 4.0–10.5)
nRBC: 0 % (ref 0.0–0.2)

## 2019-04-22 LAB — MAGNESIUM: Magnesium: 2 mg/dL (ref 1.7–2.4)

## 2019-04-22 LAB — PHOSPHORUS: Phosphorus: 2.6 mg/dL (ref 2.5–4.6)

## 2019-04-22 MED ORDER — HYDROCODONE-ACETAMINOPHEN 5-325 MG PO TABS
1.0000 | ORAL_TABLET | Freq: Four times a day (QID) | ORAL | Status: DC | PRN
  Administered 2019-04-22 – 2019-04-25 (×7): 1 via ORAL
  Filled 2019-04-22 (×7): qty 1

## 2019-04-22 NOTE — Evaluation (Signed)
Occupational Therapy Evaluation Patient Details Name: [name] [name] MRN: [id number] DOB: [birth year] Today's Date: 04/22/2019    History of Present Illness [name] [name] is a 78 y.o. female with medical history significant for atrial fibrillation/ H/o MVP, with severe MR 04/2012-s/p MV repair, CAD- systolic CHF, chronic anemia, anxiety disorder, hyperlipidemia unable to tolerate Crestor history of diverticulitis-last Colonoscopy in 2013, followed by [facility] last seen in 2019 comes to ER for evaluation of shortness of breath abdominal pain and headache.  Patient was seen on 12/23 at urgent care for abdominal pain CT showed 'showed possible mild diverticulitis, [place] home. H/O an old L3 endplate compression fracture.  Complains of headache , Sob and DOE ,abdominal pain.Repeat echo showed slightly worsening EF and grade 2 diastolic dysfunction.   Clinical Impression   Pt admitted with the above. Pt currently with functional limitations due to the deficits listed below (see OT Problem List).  Pt will benefit from skilled OT to increase their safety and independence with ADL and functional mobility for ADL to facilitate discharge to venue listed below.   Pt does not have A at home- will likely need SNF     Follow Up Recommendations  SNF    Equipment Recommendations  None recommended by OT    Recommendations for Other Services       Precautions / Restrictions Precautions Precautions: Fall Precaution Comments: back pain, very weak      Mobility Bed Mobility Overal bed mobility: Needs Assistance Bed Mobility: Rolling;Sidelying to Sit;Sit to Supine Rolling: Mod assist Sidelying to sit: Mod assist   Sit to supine: Mod assist   General bed mobility comments: assist with trunk and legs, then legs to return to supine.  Transfers Overall transfer level: Needs assistance Equipment used: Rolling walker (2 wheeled) Transfers: Sit to/from Stand Sit to Stand: Mod assist          General transfer comment: steady assist to rise from bed. $ sidesteps along the bed using RW. Pt wantd to ambulate but IV site bleeding.    Balance Overall balance assessment: Needs assistance Sitting-balance support: Feet supported;Bilateral upper extremity supported Sitting balance-Leahy Scale: Fair     Standing balance support: During functional activity;Bilateral upper extremity supported Standing balance-Leahy Scale: Poor Standing balance comment: needs UE support                           ADL either performed or assessed with clinical judgement   ADL Overall ADL's : Needs assistance/impaired Eating/Feeding: Set up;Sitting   Grooming: Set up;Sitting   Upper Body Bathing: Set up;Sitting   Lower Body Bathing: Moderate assistance;Sit to/from stand;Cueing for compensatory techniques;Cueing for sequencing;Cueing for safety   Upper Body Dressing : Minimal assistance   Lower Body Dressing: Moderate assistance;Sit to/from stand;Cueing for compensatory techniques;Cueing for safety;Cueing for sequencing   Toilet Transfer: Moderate assistance;BSC;RW;Cueing for sequencing;Stand-pivot;Squat-pivot   Toileting- Clothing Manipulation and Hygiene: Maximal assistance;Sit to/from stand;Cueing for safety;With adaptive equipment         General ADL Comments: pt lives alone- will need SNF                  Pertinent Vitals/Pain Pain Assessment: Faces Pain Score: 4  Faces Pain Scale: Hurts whole lot Pain Location: right lower  lateral trunk Pain Descriptors / Indicators: Discomfort;Crying;Moaning;Cramping;Grimacing Pain Intervention(s): Limited activity within patient's tolerance;Monitored during session     Hand Dominance Right   Extremity/Trunk Assessment Upper Extremity Assessment Upper Extremity Assessment: Generalized  weakness   Lower Extremity Assessment Lower Extremity Assessment: Generalized weakness   Cervical / Trunk Assessment Cervical / Trunk  Assessment: Kyphotic   Communication Communication Communication: No difficulties   Cognition Arousal/Alertness: Awake/alert Behavior During Therapy: Anxious Overall Cognitive Status: Within Functional Limits for tasks assessed                                                Home Living Family/patient expects to be discharged to:: Private residence Living Arrangements: Alone Available Help at Discharge: Family Type of Home: Apartment Home Access: Stairs to enter Secretary/administrator of Steps: 14 Entrance Stairs-Rails: Can reach both Home Layout: One level     Bathroom Shower/Tub: Chief Strategy Officer: Standard Bathroom Accessibility: Yes   Home Equipment: Cane - single point;Walker - 2 wheels   Additional Comments: per pt, family is not  available assisting      Prior Functioning/Environment Level of Independence: Independent                 OT Problem List: Decreased strength;Decreased activity tolerance;Impaired balance (sitting and/or standing);Decreased safety awareness;Decreased knowledge of use of DME or AE      OT Treatment/Interventions: Self-care/ADL training;Patient/family education;DME and/or AE instruction    OT Goals(Current goals can be found in the care plan section) Acute Rehab OT Goals Patient Stated Goal: to go home, feel better OT Goal Formulation: With patient Time For Goal Achievement: 04/30/19 Potential to Achieve Goals: Good  OT Frequency: Min 2X/week   Barriers to D/C:               AM-PAC OT "6 Clicks" Daily Activity     Outcome Measure Help from another person eating meals?: A Little Help from another person taking care of personal grooming?: A Little Help from another person toileting, which includes using toliet, bedpan, or urinal?: A Lot Help from another person bathing (including washing, rinsing, drying)?: A Little Help from another person to put on and taking off regular upper body  clothing?: A Little Help from another person to put on and taking off regular lower body clothing?: A Lot 6 Click Score: 16   End of Session Equipment Utilized During Treatment: Rolling walker Nurse Communication: Mobility status  Activity Tolerance: Patient tolerated treatment well Patient left: in bed;with call bell/phone within reach;with bed alarm set  OT Visit Diagnosis: Unsteadiness on feet (R26.81);Muscle weakness (generalized) (M62.81);Other abnormalities of gait and mobility (R26.89);History of falling (Z91.81)                Time: 1127-1140 OT Time Calculation (min): 13 min Charges:  OT General Charges $OT Visit: 1 Visit OT Evaluation $OT Eval Moderate Complexity: 1 Mod  Lise Auer, OT Acute Rehabilitation Services [phone] Office- [phone]     [name] [name], Karin Golden D 04/22/2019, 1:05 PM

## 2019-04-22 NOTE — Evaluation (Signed)
Clinical/Bedside Swallow Evaluation Patient Details  Name: [name] [name] MRN: [id number] Date of Birth: 31-Jan-1941  Today's Date: 04/22/2019 Time: SLP Start Time (ACUTE ONLY): 1511 SLP Stop Time (ACUTE ONLY): 1524 SLP Time Calculation (min) (ACUTE ONLY): 13 min  Past Medical History:  Past Medical History:  Diagnosis Date  . Acute coronary syndrome (HCC) 06/2015  . 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 Anemia   . Anxiety   . Aortic stenosis 05/19/2012   trivial by TEE  . Atrial fibrillation (HCC)    a. Dx 04/2012;  b. Amio d/c'd 2/2 hyperthyroidism;  c. Atrial flutter 10/2012;  d. On xarelto.  [name]   . CHF (congestive heart failure) (HCC)   . Complication of anesthesia   . Coronary artery disease   . Diverticulitis   . GERD (gastroesophageal reflux disease)   . Goiter   . Headache   . Heart murmur   . Hyperlipidemia    recently- taken off Crestor- due to pain in her legs, but this was after cardiac cath. , pt. questioning whether the pain in her legs was related to cath. or crestor  . Hypertension    Then hypotension 04/2012 limiting med adjustment.  . Intractable vomiting   . Malnutrition of moderate degree (HCC)   . Microscopic hematuria 08/20/2012  . Mitral valve prolapse    a. H/o MVP, with severe MR 04/2012, had MV repair and Maze  . Pneumonia    treated /w antibiotic- [facility]- early Feb. 2014  . PONV (postoperative nausea and vomiting)   . Refusal of blood transfusions as patient is Jehovah's Witness   . S/P Maze operation for atrial fibrillation 06/28/2012   Complete bilateral atrial lesion set using cryothermy ablation with oversewing of LA appendage via right minithoracotomy  . S/P mitral valve repair 06/28/2012   Complex valvuloplasty including quadrangular resection of posterior leaflet, sliding leaflet plasty, artificial Gore-tex neocord placement x2 and 6mm Sorin Memo 3D ring annuloplasty via right mini thoracotomy   . SBO (small bowel obstruction) (HCC)    . Shortness of breath    Past Surgical History:  Past Surgical History:  Procedure Laterality Date  . CARDIAC CATHETERIZATION N/A 07/16/2015   Procedure: Left Heart Cath and Coronary Angiography;  Surgeon: Orpah Cobb, MD;  Location: [facility] INVASIVE CV LAB;  Service: Cardiovascular;  Laterality: N/A;  . CARDIOVERSION N/A 07/04/2018   Procedure: CARDIOVERSION;  Surgeon: [name], MD;  Location: [facility] ENDOSCOPY;  Service: Cardiovascular;  Laterality: N/A;  . CORONARY ARTERY BYPASS GRAFT    . INTRAOPERATIVE TRANSESOPHAGEAL ECHOCARDIOGRAM N/A 06/28/2012   Procedure: INTRAOPERATIVE TRANSESOPHAGEAL ECHOCARDIOGRAM;  Surgeon: Purcell Nails, MD;  Location: [facility] OR;  Service: Open Heart Surgery;  Laterality: N/A;  . LAPAROSCOPY N/A 05/21/2016   Procedure: LAPAROSCOPY DIAGNOSTIC, LYSIS OF ADHESION BAND;  Surgeon: Axel Filler, MD;  Location: [facility] OR;  Service: General;  Laterality: N/A;  . LEFT AND RIGHT HEART CATHETERIZATION WITH CORONARY ANGIOGRAM N/A 05/22/2012   Procedure: LEFT AND RIGHT HEART CATHETERIZATION WITH CORONARY ANGIOGRAM;  Surgeon: Kathleene Hazel, MD;  Location: [facility] CATH LAB;  Service: Cardiovascular;  Laterality: N/A;  . MINIMALLY INVASIVE MAZE PROCEDURE N/A 06/28/2012   Procedure: MINIMALLY INVASIVE MAZE PROCEDURE;  Surgeon: Purcell Nails, MD;  Location: [facility] OR;  Service: Open Heart Surgery;  Laterality: N/A;  . MITRAL VALVE REPAIR Right 06/28/2012   Procedure: MINIMALLY INVASIVE MITRAL VALVE REPAIR (MVR);  Surgeon: Purcell Nails, MD;  Location: [facility] OR;  Service: Open Heart Surgery;  Laterality:  Right;  [name] OVARIAN CYST SURGERY  1988  . TEE WITHOUT CARDIOVERSION  05/19/2012   Procedure: TRANSESOPHAGEAL ECHOCARDIOGRAM (TEE);  Surgeon: Dolores Patty, MD;  Location: [facility] ENDOSCOPY;  Service: Cardiovascular;  Laterality: N/A;  . TUBAL LIGATION  1974  . URETHRAL STRICTURE DILATATION    . UTERINE FIBROID SURGERY  1988  . `     HPI:  [name] [name] is a 78 y.o. female with  medical history significant for atrial fibrillation/ H/o MVP, with severe MR 04/2012-s/p MV repair, CAD- systolic CHF, chronic anemia, anxiety disorder, hyperlipidemia unable to tolerate Crestor history of diverticulitis-last Colonoscopy in 2013, followed by [facility] last seen in 2019 comes to ER for evaluation of shortness of breath abdominal pain and headache.  Patient was seen on 12/23 at urgent care for abdominal pain CT showed 'showed possible mild diverticulitis, DC'd home. H/O an old L3 endplate compression fracture.  Complains of headache , Sob and DOE ,abdominal pain.Repeat echo showed slightly worsening EF and grade 2 diastolic dysfunction.   Assessment / Plan / Recommendation Clinical Impression  Pt's oropharyngeal swallow appears to be [facility], although she reports difficulty swallowing larger pills specifically. She says that this has been an ongoing problem without any acute changes. At home she uses warm liquids and/or applesaue to take her meds. Recommend continuing full liquid diet, advancing solids per MD as medically able and per pt preference given that she does not have her partial dentures here. Please reorder SLP with any acute changes.  SLP Visit Diagnosis: Dysphagia, unspecified (R13.10)    Aspiration Risk  Mild aspiration risk    Diet Recommendation Regular;Thin liquid(when medically able)   Liquid Administration via: Cup;Straw Medication Administration: Whole meds with puree Supervision: Patient able to self feed;Intermittent supervision to cue for compensatory strategies Compensations: Slow rate;Small sips/bites Postural Changes: Seated upright at 90 degrees;Remain upright for at least 30 minutes after po intake    Other  Recommendations Oral Care Recommendations: Oral care BID   Follow up Recommendations None      Frequency and Duration            Prognosis Prognosis for Safe Diet Advancement: Good      Swallow Study   General HPI: [name] [name] is a 78  y.o. female with medical history significant for atrial fibrillation/ H/o MVP, with severe MR 04/2012-s/p MV repair, CAD- systolic CHF, chronic anemia, anxiety disorder, hyperlipidemia unable to tolerate Crestor history of diverticulitis-last Colonoscopy in 2013, followed by [facility] last seen in 2019 comes to ER for evaluation of shortness of breath abdominal pain and headache.  Patient was seen on 12/23 at urgent care for abdominal pain CT showed 'showed possible mild diverticulitis, DC'd home. H/O an old L3 endplate compression fracture.  Complains of headache , Sob and DOE ,abdominal pain.Repeat echo showed slightly worsening EF and grade 2 diastolic dysfunction. Type of Study: Bedside Swallow Evaluation Previous Swallow Assessment: none in chart Diet Prior to this Study: Thin liquids;Other (Comment)(full liquids) Temperature Spikes Noted: No Respiratory Status: Room air History of Recent Intubation: No Behavior/Cognition: Alert;Cooperative Oral Cavity Assessment: Within Functional Limits Oral Care Completed by SLP: No Oral Cavity - Dentition: Dentures, top;Missing dentition(does not have bottom dentures) Vision: Functional for self-feeding Self-Feeding Abilities: Able to feed self Patient Positioning: Upright in bed Baseline Vocal Quality: Normal Volitional Swallow: Able to elicit    Oral/Motor/Sensory Function Overall Oral Motor/Sensory Function: Within functional limits   Ice Chips Ice chips: Not tested   Thin Liquid  Thin Liquid: Within functional limits Presentation: Cup;Self Fed    Nectar Thick Nectar Thick Liquid: Not tested   Honey Thick Honey Thick Liquid: Not tested   Puree Puree: Within functional limits Presentation: Self Fed;Spoon   Solid     Solid: Not tested       Osie Bond., M.A. [place] Pager [phone] Office [phone]  04/22/2019,3:29 PM

## 2019-04-22 NOTE — Evaluation (Signed)
Physical Therapy Evaluation Patient Details Name: [name] [name] MRN: [id number] DOB: 05/30/1940 Today's Date: 04/22/2019   History of Present Illness  [name] [name] is a 77 y.o. female with medical history significant for atrial fibrillation/ H/o MVP, with severe MR 04/2012-s/p MV repair, CAD- systolic CHF, chronic anemia, anxiety disorder, hyperlipidemia unable to tolerate Crestor history of diverticulitis-last Colonoscopy in 2013, followed by [facility] last seen in 2019 comes to ER for evaluation of shortness of breath abdominal pain and headache.  Patient was seen on 12/23 at urgent care for abdominal pain CT showed 'showed possible mild diverticulitis, [place] home. H/O an old L3 endplate compression fracture.  Complains of headache , Sob and DOE ,abdominal pain.Repeat echo showed slightly worsening EF and grade 2 diastolic dysfunction.  Clinical Impression  The patient presents with weakness, pain on right lower thorax, general deconditioning. Patient states that her family does not offer assistance. Currently patient requires assistance for mobility. Patient lives on second floor condo. Patient does not desire SNF. Pt admitted with above diagnosis.   Pt currently with functional limitations due to the deficits listed below (see PT Problem List). Pt will benefit from skilled PT to increase their independence and safety with mobility to allow discharge to the venue listed below.       Follow Up Recommendations SNF;Supervision/Assistance - 24 hour    Equipment Recommendations  None recommended by PT    Recommendations for Other Services       Precautions / Restrictions Precautions Precautions: Fall Precaution Comments: back pain, very weak      Mobility  Bed Mobility Overal bed mobility: Needs Assistance Bed Mobility: Rolling;Sidelying to Sit;Sit to Supine Rolling: Mod assist Sidelying to sit: Mod assist   Sit to supine: Mod assist   General bed mobility comments: assist  with trunk and legs, then legs to return to supine.  Transfers Overall transfer level: Needs assistance Equipment used: Rolling walker (2 wheeled) Transfers: Sit to/from Stand Sit to Stand: Min assist         General transfer comment: steady assist to rise from bed. $ sidesteps along the bed using RW. Pt wantd to ambulate but IV site bleeding.  Ambulation/Gait                Stairs            Wheelchair Mobility    Modified Rankin (Stroke Patients Only)       Balance Overall balance assessment: Needs assistance Sitting-balance support: Feet supported;Bilateral upper extremity supported Sitting balance-Leahy Scale: Fair     Standing balance support: During functional activity;Bilateral upper extremity supported Standing balance-Leahy Scale: Poor Standing balance comment: needs UE support                             Pertinent Vitals/Pain Pain Assessment: Faces Faces Pain Scale: Hurts whole lot Pain Location: right lower  lateral trunk Pain Descriptors / Indicators: Discomfort;Crying;Moaning;Cramping;Grimacing Pain Intervention(s): Monitored during session;RN gave pain meds during session;Repositioned    Home Living Family/patient expects to be discharged to:: Private residence Living Arrangements: Alone Available Help at Discharge: Family Type of Home: Apartment Home Access: Stairs to enter Entrance Stairs-Rails: Can reach both Entrance Stairs-Number of Steps: 14 Home Layout: One level Home Equipment: Cane - single point;Walker - 2 wheels Additional Comments: per pt, family is not  available assisting    Prior Function Level of Independence: Independent  Hand Dominance   Dominant Hand: Right    Extremity/Trunk Assessment   Upper Extremity Assessment Upper Extremity Assessment: Defer to OT evaluation    Lower Extremity Assessment Lower Extremity Assessment: Generalized weakness    Cervical / Trunk  Assessment Cervical / Trunk Assessment: Kyphotic  Communication   Communication: No difficulties  Cognition Arousal/Alertness: Awake/alert Behavior During Therapy: Anxious Overall Cognitive Status: Within Functional Limits for tasks assessed                                        General Comments      Exercises     Assessment/Plan    PT Assessment Patient needs continued PT services  PT Problem List Decreased strength;Decreased mobility;Decreased safety awareness;Decreased knowledge of precautions;Decreased activity tolerance;Decreased knowledge of use of DME;Pain       PT Treatment Interventions DME instruction;Therapeutic activities;Gait training;Functional mobility training;Stair training;Therapeutic exercise;Patient/family education    PT Goals (Current goals can be found in the Care Plan section)  Acute Rehab PT Goals Patient Stated Goal: to go home, feel better PT Goal Formulation: With patient Time For Goal Achievement: 05/06/19 Potential to Achieve Goals: Fair    Frequency Min 2X/week   Barriers to discharge Decreased caregiver support;Inaccessible home environment patient states family does not help her, lives on second level condo w/ 14 steps    Co-evaluation               AM-PAC PT "6 Clicks" Mobility  Outcome Measure Help needed turning from your back to your side while in a flat bed without using bedrails?: A Lot Help needed moving from lying on your back to sitting on the side of a flat bed without using bedrails?: A Lot Help needed moving to and from a bed to a chair (including a wheelchair)?: A Lot Help needed standing up from a chair using your arms (e.g., wheelchair or bedside chair)?: A Lot Help needed to walk in hospital room?: A Lot Help needed climbing 3-5 steps with a railing? : Total 6 Click Score: 11    End of Session   Activity Tolerance: Patient limited by fatigue;Patient limited by pain Patient left: in bed;with  call bell/phone within reach;with bed alarm set;with nursing/sitter in room Nurse Communication: Mobility status PT Visit Diagnosis: Difficulty in walking, not elsewhere classified (R26.2);Pain    Time: 5701-7793 PT Time Calculation (min) (ACUTE ONLY): 24 min   Charges:   PT Evaluation $PT Eval Moderate Complexity: 1 Mod PT Treatments $Therapeutic Activity: 8-22 mins        Blanchard Kelch PT Acute Rehabilitation Services Pager [phone] Office [phone]   Rada Hay 04/22/2019, 9:34 AM

## 2019-04-22 NOTE — Progress Notes (Signed)
PROGRESS NOTE    [name] [name]  GMW:[id number] DOB: 1940-12-06 DOA: 04/20/2019 PCP: [name] ParadiseAronson, Richard, MD   Brief Narrative:  HPI per Dr. Lanae Boastamesh [name] on 04/20/2019  [name] [name] is a 78 y.o. female with medical history significant for atrial fibrillation/ H/o MVP, with severe MR 04/2012-s/p MV repair and Maze procedure, CAD-last cath 2017, systolic CHF LVEF 40% in 02/2018, chronic anemia, anxiety disorder, hyperlipidemia unable to tolerate Crestor, history of diverticulitis-last Colonoscopy in 2013, followed by [name] [name] last seen in 2019 comes to ER for evaluation of shortness of breath, abdominal pain and headache.  Patient was seen on 12/23 at urgent care for abdominal pain CT showed 'showed possible mild diverticulitis, with no other acute pathology (an old L3 endplate compression fracture was noted, seen on prior imaging), placed on Augmentin and discharged home.  She has been taking Augmentin but has been having upset stomach. In the ED, VSS except for HR in 110s.  Labs showed potassium 2.4, mag 1.3, calcium 6.3.  Chest x-ray with no acute cardiopulmonary disease, known right thyroid goiter.  Patient admitted for further management.    Assessment & Plan:   Principal Problem:   Hypokalemia Active Problems:   Diverticulitis   Hypertension   Anxiety   Atrial fibrillation (HCC)   Status post Maze operation for atrial fibrillation   S/P MVR (mitral valve repair)   Hypomagnesemia   Anemia   Patient is Jehovah's Witness   Acute diverticulitis Currently afebrile, with no leukocytosis Repeat CT Abdomen and Pelvis showed no acute findings or evidence of complicated diverticulitis Last colonoscopy in 2013 by [name] [name] Continue IV Zosyn (having trouble swallowing pills) Continue full liquid diet, advance as tolerated Monitor closely  Acute on Chronic systolic and diastolic HF No significant overload on examination, reports orthopnea Chest x-ray clear, BNP 224 Repeat ECHO  showed worsening EF of 30-35% and Grade 2 DD with no LVH May need Lasix as needed Strict I's and O's, daily weights   Paroxysmal atrial fibrillation with severe MR s/p MV repair/maze procedure HR not controlled, 110s EKG done showed sinus tachy, ??  Anxiety versus hyperthyroidism Continue sotalol for now, if worsens may need ??IV metoprolol as needed Continue xarelto  ??  Dysphagia Having trouble swallowing pills SLP  Malnutrition BMI of 14.79 Nutrition consult  Hyperthyroidism  TSH 1.323 Free T4 0.78, will order T3 Continue methimazole  CAD Chest pain-free Normal cath in 2017  Anxiety Cont home xanax as needed  Patient is Jehovah's Witness  GOC, DNR poA     DVT prophylaxis: Xarelto Code Status: DO NOT RESUSCITATE Family Communication: No family present at bedside Disposition Plan: SNF  Consultants:   None   Procedures:  None    Antimicrobials:  Anti-infectives (From admission, onward)   Start     Dose/Rate Route Frequency Ordered Stop   04/20/19 2200  piperacillin-tazobactam (ZOSYN) IVPB 3.375 g     3.375 g 12.5 mL/hr over 240 Minutes Intravenous Every 8 hours 04/20/19 1314     04/20/19 1315  piperacillin-tazobactam (ZOSYN) IVPB 3.375 g     3.375 g 100 mL/hr over 30 Minutes Intravenous  Once 04/20/19 1314 04/20/19 1540     Subjective: Patient seen and examined at bedside, complains of not been able to swallow her pills, requesting applesauce.  Reports shoulder pain and some mild lower abdominal pain.  Patient denies any chest pain, shortness of breath, nausea/vomiting, diarrhea, fever/chills.  Objective: Vitals:   04/21/19 2015 04/21/19 2224 04/22/19 0100 04/22/19 [id number]  BP: 137/76 135/75  137/88  Pulse: (!) 115 (!) 118 (!) 113 (!) 117  Resp: 16 16  16   Temp: 98.3 F (36.8 C) 97.8 F (36.6 C)  (!) 97.5 F (36.4 C)  TempSrc: Oral Oral  Oral  SpO2: 100% 100%  100%  Weight:      Height:        Intake/Output Summary (Last 24 hours) at  04/22/2019 0952 Last data filed at 04/22/2019 [id number] Gross per 24 hour  Intake 1062.44 ml  Output 2050 ml  Net -987.56 ml   Filed Weights   04/21/19 0258  Weight: 42.2 kg   Examination: Physical Exam:  General: NAD, thin elderly, appears anxious  Cardiovascular: S1, S2 present  Respiratory:  Diminished breath sounds bilaterally  Abdomen: Soft, mild lower abdominal tenderness, nondistended, bowel sounds present  Musculoskeletal: No bilateral pedal edema noted  Skin: Normal  Psychiatry: Normal mood    Data Reviewed: I have personally reviewed following labs and imaging studies  CBC: Recent Labs  Lab 04/18/19 0958 04/20/19 0835 04/21/19 0509 04/21/19 1107 04/21/19 1918 04/22/19 0524  WBC 13.8* 6.1 9.9  --   --  8.6  NEUTROABS 11.7* 4.9  --   --   --  5.0  HGB 13.6 8.9* 13.0 12.7 12.3 13.7  HCT 42.1 28.3* 40.2 38.5 37.6 42.6  MCV 91.5 94.3 93.1  --   --  91.4  PLT 208 145* 207  --   --  220   Basic Metabolic Panel: Recent Labs  Lab 04/18/19 0958 04/20/19 0835 04/21/19 0509 04/21/19 0838 04/22/19 0524  NA 137 139 135  --  135  K 4.1 2.4* 3.8  --  3.9  CL 102 115* 103  --  93*  CO2 27 20* 23  --  27  GLUCOSE 119* 79 97  --  98  BUN 14 7* 9  --  9  CREATININE 0.70 0.43* 0.50  --  0.68  CALCIUM 10.1 6.3* 9.4  --  9.4  MG  --  1.3*  --  2.3 2.0  PHOS  --   --   --  3.0 2.6   GFR: Estimated Creatinine Clearance: 39.2 mL/min (by C-G formula based on SCr of 0.68 mg/dL). Liver Function Tests: Recent Labs  Lab 04/18/19 0958 04/20/19 0835 04/21/19 0509 04/22/19 0524  AST 45* 16 21 19   ALT 32 16 19 16   ALKPHOS 90 52 66 62  BILITOT 1.0 0.7 1.8* 1.2  PROT 7.3 4.6* 7.1 7.0  ALBUMIN 4.2 2.5* 3.9 3.9   Recent Labs  Lab 04/18/19 0958  LIPASE 29   No results for input(s): AMMONIA in the last 168 hours. Coagulation Profile: Recent Labs  Lab 04/21/19 0509  INR 1.5*   Cardiac Enzymes: No results for input(s): CKTOTAL, CKMB, CKMBINDEX, TROPONINI in  the last 168 hours. BNP (last 3 results) No results for input(s): PROBNP in the last 8760 hours. HbA1C: No results for input(s): HGBA1C in the last 72 hours. CBG: No results for input(s): GLUCAP in the last 168 hours. Lipid Profile: No results for input(s): CHOL, HDL, LDLCALC, TRIG, CHOLHDL, LDLDIRECT in the last 72 hours. Thyroid Function Tests: Recent Labs    04/20/19 0835  TSH 1.323   Anemia Panel: Recent Labs    04/20/19 1326  VITAMINB12 512  FOLATE 20.6  FERRITIN 154  TIBC 289  IRON 54   Sepsis Labs: Recent Labs  Lab 04/18/19 0958 04/20/19 1326  LATICACIDVEN 1.1 1.7  Recent Results (from the past 240 hour(s))  SARS CORONAVIRUS 2 (TAT 6-24 HRS) Nasopharyngeal Nasopharyngeal Swab     Status: None   Collection Time: 04/20/19  1:26 PM   Specimen: Nasopharyngeal Swab  Result Value Ref Range Status   SARS Coronavirus 2 NEGATIVE NEGATIVE Final    Comment: (NOTE) SARS-CoV-2 target nucleic acids are NOT DETECTED. The SARS-CoV-2 RNA is generally detectable in upper and lower respiratory specimens during the acute phase of infection. Negative results do not preclude SARS-CoV-2 infection, do not rule out co-infections with other pathogens, and should not be used as the sole basis for treatment or other patient management decisions. Negative results must be combined with clinical observations, patient history, and epidemiological information. The expected result is Negative. Fact Sheet for Patients: SugarRoll.be Fact Sheet for Healthcare Providers: https://www.woods-mathews.com/ This test is not yet approved or cleared by the [place] FDA and  has been authorized for detection and/or diagnosis of SARS-CoV-2 by FDA under an Emergency Use Authorization (EUA). This EUA will remain  in effect (meaning this test can be used) for the duration of the COVID-19 declaration under Section 56 4(b)(1) of the Act, 21 U.S.C. section  360bbb-3(b)(1), unless the authorization is terminated or revoked sooner. Performed at [facility], [place] [address], [place], [place] [address]     Radiology Studies: CT ABDOMEN PELVIS WO CONTRAST  Result Date: 04/20/2019 CLINICAL DATA:  Shortness of breath with abdominal pain and headache since yesterday. Diverticulitis complications suspected. EXAM: CT ABDOMEN AND PELVIS WITHOUT CONTRAST TECHNIQUE: Multidetector CT imaging of the abdomen and pelvis was performed following the standard protocol without IV contrast. COMPARISON:  Abdominopelvic CT 04/18/2019 and 03/12/2019. FINDINGS: Lower chest: Slight enlargement of dependent right pleural effusion with increased right basilar atelectasis. Stable cardiomegaly and aortic atherosclerosis. Hepatobiliary: The liver appears unremarkable as imaged in the noncontrast state. No evidence of gallstones, gallbladder wall thickening or biliary dilatation. Pancreas: Unremarkable. No pancreatic ductal dilatation or surrounding inflammatory changes. Spleen: Normal in size without focal abnormality. Adrenals/Urinary Tract: Both adrenal glands appear normal. No focal renal abnormalities are identified on noncontrast imaging. There are bilateral extrarenal pelves. No evidence of urinary tract calculus or hydronephrosis. New at least moderate bladder distention without wall thickening or surrounding inflammation. Stomach/Bowel: The stomach, small bowel and proximal colon appear stable without significant findings. There are diverticular changes throughout the descending and sigmoid colon. There is mild sigmoid colon wall thickening, but no extraluminal fluid collection. Enteric contrast was administered although has not yet passed through the sigmoid colon. No evidence of bowel obstruction. Vascular/Lymphatic: There are no enlarged abdominal or pelvic lymph nodes. Aortic and branch vessel atherosclerosis. Reproductive: The uterus and ovaries appear normal. No adnexal  mass. Other: No appreciable ascites, focal extraluminal fluid or air collections. Musculoskeletal: No acute or significant osseous findings. Stable superior endplate compression deformity at L3 and mild lumbar spondylosis. IMPRESSION: 1. Compared with the recent CT, no acute findings or evidence of complicated diverticulitis. Mild sigmoid colon wall thickening appears unchanged. No evidence of abscess or bowel obstruction. 2. New bladder distention without wall thickening, surrounding distension or hydronephrosis. 3. Minimally increased right pleural effusion and associated right basilar atelectasis. 4.  Aortic Atherosclerosis (ICD10-I70.0). Electronically Signed   By: Richardean Sale M.D.   On: 04/20/2019 19:17   DG CHEST PORT 1 VIEW  Result Date: 04/22/2019 CLINICAL DATA:  Shortness of breath today. EXAM: PORTABLE CHEST 1 VIEW COMPARISON:  04/20/2019 and chest CT 07/25/2015 FINDINGS: Patient is slightly rotated to  the right. Lungs are hyperexpanded without focal airspace consolidation, effusion or pneumothorax. Opacification over the upper right paramediastinal region with deviation of the trachea to the left compatible with known right thyroid goiter. Mild stable cardiomegaly. Remainder of the exam is unchanged. IMPRESSION: Hyperexpansion without acute cardiopulmonary disease. Mild stable cardiomegaly. Right thyroid goiter. Electronically Signed   By: Elberta Fortis M.D.   On: 04/22/2019 04:53   ECHOCARDIOGRAM COMPLETE  Result Date: 04/21/2019   ECHOCARDIOGRAM REPORT   Patient Name:   [name] [name] Date of Exam: 04/21/2019 Medical Rec #:  [id number]         Height:       66.5 in Accession #:    [id number]        Weight:       93.0 lb Date of Birth:  12/12/1940         BSA:          1.45 m Patient Age:    78 years          BP:           129/74 mmHg Patient Gender: F                 HR:           115 bpm. Exam Location:  Inpatient Procedure: 2D Echo, Color Doppler and Cardiac Doppler Indications:     Dyspnea 786.09  History:        Patient has prior history of Echocardiogram examinations, most                 recent 07/11/2015. Mitral Valve Disease.  Sonographer:    Roosvelt Maser RDCS Referring Phys: [id number] [facility] LATIF [facility] IMPRESSIONS  1. Left ventricular ejection fraction, by visual estimation, is 30 to 35%. The left ventricle has severely decreased function. There is no left ventricular hypertrophy.  2. Left ventricular diastolic parameters are consistent with Grade II diastolic dysfunction (pseudonormalization).  3. The left ventricle demonstrates global hypokinesis.  4. Global right ventricle has normal systolic function.The right ventricular size is normal. No increase in right ventricular wall thickness.  5. Left atrial size was normal.  6. Right atrial size was severely dilated.  7. The mitral valve is normal in structure. No evidence of mitral valve regurgitation. No evidence of mitral stenosis.  8. MV repair, mean gradient .  9. The tricuspid valve is normal in structure. 10. Aortic valve regurgitation is moderate. 11. The aortic valve is tricuspid. Aortic valve regurgitation is moderate. No evidence of aortic valve sclerosis or stenosis. 12. The pulmonic valve was normal in structure. Pulmonic valve regurgitation is not visualized. 13. The inferior vena cava is normal in size with greater than 50% respiratory variability, suggesting right atrial pressure of 3 mmHg. FINDINGS  Left Ventricle: Left ventricular ejection fraction, by visual estimation, is 30 to 35%. The left ventricle has severely decreased function. The left ventricle demonstrates global hypokinesis. There is no left ventricular hypertrophy. Left ventricular diastolic parameters are consistent with Grade II diastolic dysfunction (pseudonormalization). Normal left atrial pressure. Right Ventricle: The right ventricular size is normal. No increase in right ventricular wall thickness. Global RV systolic function is has normal systolic  function. Left Atrium: Left atrial size was normal in size. Right Atrium: Right atrial size was severely dilated Pericardium: There is no evidence of pericardial effusion. Mitral Valve: The mitral valve is normal in structure. No evidence of mitral valve regurgitation. No evidence of mitral valve stenosis by observation. MV  peak gradient, 7.0 mmHg. MV repair, mean gradient . Tricuspid Valve: The tricuspid valve is normal in structure. Tricuspid valve regurgitation is trivial. Aortic Valve: The aortic valve is tricuspid. . There is moderate thickening and moderate calcification of the aortic valve. Aortic valve regurgitation is moderate. The aortic valve is structurally normal, with no evidence of sclerosis or stenosis. There is moderate thickening of the aortic valve. There is moderate calcification of the aortic valve. Pulmonic Valve: The pulmonic valve was normal in structure. Pulmonic valve regurgitation is not visualized. Pulmonic regurgitation is not visualized. Aorta: The aortic root, ascending aorta and aortic arch are all structurally normal, with no evidence of dilitation or obstruction. Venous: The inferior vena cava is normal in size with greater than 50% respiratory variability, suggesting right atrial pressure of 3 mmHg. IAS/Shunts: No atrial level shunt detected by color flow Doppler. There is no evidence of a patent foramen ovale. No ventricular septal defect is seen or detected. There is no evidence of an atrial septal defect.  LEFT VENTRICLE PLAX 2D LVIDd:         5.00 cm LVIDs:         4.10 cm LV PW:         1.00 cm LV IVS:        1.00 cm LVOT diam:     1.60 cm LV SV:         44 ml LV SV Index:   31.74 LVOT Area:     2.01 cm  LV Volumes (MOD) LV area d, A4C:    36.60 cm LV area s, A4C:    31.10 cm LV major d, A4C:   8.34 cm LV major s, A4C:   8.23 cm LV vol d, MOD A4C: 132.0 ml LV vol s, MOD A4C: 104.0 ml LV SV MOD A4C:     132.0 ml RIGHT VENTRICLE          IVC RV Basal diam:  3.30 cm  IVC  diam: 1.80 cm TAPSE (M-mode): 1.5 cm LEFT ATRIUM         Index      RIGHT ATRIUM           Index LA diam:    3.80 cm 2.62 cm/m RA Area:     21.50 cm                                RA Volume:   65.80 ml  45.30 ml/m  AORTIC VALVE LVOT Vmax:   63.30 cm/s LVOT Vmean:  39.800 cm/s LVOT VTI:    0.083 m  AORTA Ao Root diam: 3.00 cm Ao Asc diam:  3.00 cm MITRAL VALVE MV Peak grad: 7.0 mmHg  SHUNTS MV Mean grad: 3.0 mmHg  Systemic VTI:  0.08 m MV Vmax:      1.32 m/s  Systemic Diam: 1.60 cm MV Vmean:     88.6 cm/s MV VTI:       0.25 m  Donato Schultz MD Electronically signed by Donato Schultz MD Signature Date/Time: 04/21/2019/2:30:12 PM    Final    Scheduled Meds: . cholecalciferol  1,000 Units Oral Daily  . magnesium oxide  400 mg Oral BID  . methimazole  5 mg Oral Daily  . pantoprazole (PROTONIX) IV  40 mg Intravenous Q24H  . Rivaroxaban  15 mg Oral Q supper  . sotalol  40 mg Oral BID   Continuous Infusions: . piperacillin-tazobactam (  ZOSYN)  IV 3.375 g (04/22/19 0502)    LOS: 1 day   Briant Cedar, MD [facility]  If 7PM-7AM, please contact night-coverage [email or web address]

## 2019-04-23 LAB — CBC WITH DIFFERENTIAL/PLATELET
Abs Immature Granulocytes: 0.02 10*3/uL (ref 0.00–0.07)
Basophils Absolute: 0.1 10*3/uL (ref 0.0–0.1)
Basophils Relative: 1 %
Eosinophils Absolute: 0.2 10*3/uL (ref 0.0–0.5)
Eosinophils Relative: 2 %
HCT: 42 % (ref 36.0–46.0)
Hemoglobin: 13.7 g/dL (ref 12.0–15.0)
Immature Granulocytes: 0 %
Lymphocytes Relative: 31 %
Lymphs Abs: 2.7 10*3/uL (ref 0.7–4.0)
MCH: 29.8 pg (ref 26.0–34.0)
MCHC: 32.6 g/dL (ref 30.0–36.0)
MCV: 91.3 fL (ref 80.0–100.0)
Monocytes Absolute: 0.8 10*3/uL (ref 0.1–1.0)
Monocytes Relative: 10 %
Neutro Abs: 5 10*3/uL (ref 1.7–7.7)
Neutrophils Relative %: 56 %
Platelets: 228 10*3/uL (ref 150–400)
RBC: 4.6 MIL/uL (ref 3.87–5.11)
RDW: 13.6 % (ref 11.5–15.5)
WBC: 8.8 10*3/uL (ref 4.0–10.5)
nRBC: 0 % (ref 0.0–0.2)

## 2019-04-23 LAB — BASIC METABOLIC PANEL
Anion gap: 7 (ref 5–15)
BUN: 9 mg/dL (ref 8–23)
CO2: 29 mmol/L (ref 22–32)
Calcium: 9.9 mg/dL (ref 8.9–10.3)
Chloride: 97 mmol/L — ABNORMAL LOW (ref 98–111)
Creatinine, Ser: 0.6 mg/dL (ref 0.44–1.00)
GFR calc Af Amer: >60 mL/min (ref >60)
GFR calc non Af Amer: >60 mL/min (ref >60)
Glucose, Bld: 106 mg/dL — ABNORMAL HIGH (ref 70–99)
Potassium: 3.7 mmol/L (ref 3.5–5.1)
Sodium: 133 mmol/L — ABNORMAL LOW (ref 135–145)

## 2019-04-23 MED ORDER — METOPROLOL TARTRATE 25 MG PO TABS
25.0000 mg | ORAL_TABLET | Freq: Two times a day (BID) | ORAL | Status: DC
  Administered 2019-04-23 – 2019-04-24 (×2): 25 mg via ORAL
  Filled 2019-04-23 (×2): qty 1

## 2019-04-23 MED ORDER — LOSARTAN POTASSIUM 25 MG PO TABS
25.0000 mg | ORAL_TABLET | Freq: Every day | ORAL | Status: DC
  Administered 2019-04-23 – 2019-04-25 (×3): 25 mg via ORAL
  Filled 2019-04-23 (×3): qty 1

## 2019-04-23 MED ORDER — ADULT MULTIVITAMIN W/MINERALS CH
1.0000 | ORAL_TABLET | Freq: Every day | ORAL | Status: DC
  Administered 2019-04-24 – 2019-04-25 (×2): 1 via ORAL
  Filled 2019-04-23 (×2): qty 1

## 2019-04-23 MED ORDER — AMOXICILLIN-POT CLAVULANATE 875-125 MG PO TABS
1.0000 | ORAL_TABLET | Freq: Two times a day (BID) | ORAL | Status: DC
  Administered 2019-04-23 – 2019-04-25 (×4): 1 via ORAL
  Filled 2019-04-23 (×4): qty 1

## 2019-04-23 MED ORDER — BOOST / RESOURCE BREEZE PO LIQD CUSTOM
1.0000 | Freq: Three times a day (TID) | ORAL | Status: DC
  Administered 2019-04-23 – 2019-04-25 (×5): 1 via ORAL

## 2019-04-23 MED ORDER — PRO-STAT SUGAR FREE PO LIQD
30.0000 mL | Freq: Three times a day (TID) | ORAL | Status: DC
  Administered 2019-04-24 – 2019-04-25 (×3): 30 mL via ORAL
  Filled 2019-04-23 (×3): qty 30

## 2019-04-23 MED ORDER — PANTOPRAZOLE SODIUM 40 MG PO TBEC
40.0000 mg | DELAYED_RELEASE_TABLET | Freq: Every day | ORAL | Status: DC
  Administered 2019-04-23 – 2019-04-24 (×2): 40 mg via ORAL
  Filled 2019-04-23 (×2): qty 1

## 2019-04-23 MED ORDER — POLYETHYLENE GLYCOL 3350 17 G PO PACK
17.0000 g | PACK | Freq: Every day | ORAL | Status: DC
  Administered 2019-04-23 – 2019-04-25 (×3): 17 g via ORAL
  Filled 2019-04-23 (×3): qty 1

## 2019-04-23 NOTE — Progress Notes (Signed)
Physical Therapy Treatment Patient Details Name: [name] [name] MRN: [id number] DOB: 08-09-1940 Today's Date: 04/23/2019    History of Present Illness 78 y.o. female with medical history significant for atrial fibrillation/ H/o MVP, with severe MR 04/2012-s/p MV repair, CAD- systolic CHF, chronic anemia, anxiety disorder, hyperlipidemia unable to tolerate Crestor history of diverticulitis-last Colonoscopy in 2013, followed by [facility] last seen in 2019 comes to ER for evaluation of shortness of breath abdominal pain and headache.  Patient was seen on 12/23 at urgent care for abdominal pain CT showed 'showed possible mild diverticulitis, DC'd home. H/O an old L3 endplate compression fracture.  Complains of headache , Sob and DOE ,abdominal pain.Repeat echo showed slightly worsening EF and grade 2 diastolic dysfunction.    PT Comments    Pt was agreeable to performing LE exercises in bed. She did not feel safe attempting any OOB mobility since she had just received her meds and she felt like they were too strong ("I'm seeing gnats since I took that medicine). Made RN aware at pt request. Will continue to follow and progress activity as tolerated. Continue to recommend SNF.     Follow Up Recommendations  SNF     Equipment Recommendations  None recommended by PT    Recommendations for Other Services       Precautions / Restrictions Precautions Precautions: Fall Precaution Comments: back pain Restrictions Weight Bearing Restrictions: No    Mobility  Bed Mobility               General bed mobility comments: NT-pt declined OOB this session 2* just receiving meds and she did not feel safety attempting mobility  Transfers                    Ambulation/Gait                 Stairs             Wheelchair Mobility    Modified Rankin (Stroke Patients Only)       Balance                                            Cognition  Arousal/Alertness: Awake/alert Behavior During Therapy: WFL for tasks assessed/performed Overall Cognitive Status: Within Functional Limits for tasks assessed                                        Exercises General Exercises - Lower Extremity Ankle Circles/Pumps: AROM;Both;15 reps;Supine Quad Sets: AROM;Both;10 reps;Supine Heel Slides: Both;10 reps;Supine;AROM Hip ABduction/ADduction: AAROM;Both;10 reps;Supine;AROM    General Comments        Pertinent Vitals/Pain Pain Assessment: Faces Faces Pain Scale: No hurt Pain Intervention(s): Premedicated before session    Home Living                      Prior Function            PT Goals (current goals can now be found in the care plan section) Progress towards PT goals: Progressing toward goals    Frequency    Min 2X/week      PT Plan Current plan remains appropriate    Co-evaluation  AM-PAC PT "6 Clicks" Mobility   Outcome Measure  Help needed turning from your back to your side while in a flat bed without using bedrails?: A Lot Help needed moving from lying on your back to sitting on the side of a flat bed without using bedrails?: A Lot Help needed moving to and from a bed to a chair (including a wheelchair)?: A Lot Help needed standing up from a chair using your arms (e.g., wheelchair or bedside chair)?: A Lot Help needed to walk in hospital room?: A Lot Help needed climbing 3-5 steps with a railing? : Total 6 Click Score: 11    End of Session   Activity Tolerance: Patient tolerated treatment well Patient left: in bed;with call bell/phone within reach;with bed alarm set   PT Visit Diagnosis: Difficulty in walking, not elsewhere classified (R26.2);Pain;Muscle weakness (generalized) (M62.81)     Time: 1610-9604 PT Time Calculation (min) (ACUTE ONLY): 13 min  Charges:  $Therapeutic Exercise: 8-22 mins                        Doreatha Massed, PT Acute Rehabilitation  Services

## 2019-04-23 NOTE — TOC Initial Note (Signed)
Transition of Care [facility]) - Initial/Assessment Note    Patient Details  Name: [name] [name] MRN: [id number] Date of Birth: [birth year]  Transition of [facility] CM/SW Contact:    Wende Neighbors, LCSW Phone Number: 04/23/2019, 1:44 PM  Clinical Narrative:     CSW met patient at bedside to discuss discharge plans. Patient stated she lives alone in her condo but has very nice neighbors that helps her when they can. Patient stated she is agreeable to go to a rehab facility for rehab to be able to get back on her feet. CSW will follow up with patient once bed offers are availble              Expected Discharge Plan: [place] Barriers to Discharge: Ship broker, Continued Medical Work up   Patient Goals and CMS Choice Patient states their goals for this hospitalization and ongoing recovery are:: to be home CMS Medicare.gov Compare Post Acute Care list provided to:: Patient Choice offered to / list presented to : Patient  Expected Discharge Plan and Services Expected Discharge Plan: [place] In-house Referral: Clinical Social Work   Post Acute Care Choice: [place] Living arrangements for the past 2 months: [place]                                      Prior Living Arrangements/Services Living arrangements for the past 2 months: Single Family Home Lives with:: Self Patient language and need for interpreter reviewed:: Yes Do you feel safe going back to the place where you live?: Yes      Need for Family Participation in Patient Care: Yes (Comment) Care giver support system in place?: Yes (comment)   Criminal Activity/Legal Involvement Pertinent to Current Situation/Hospitalization: No - Comment as needed  Activities of Daily Living Home Assistive Devices/Equipment: None ADL Screening (condition at time of admission) Patient's cognitive ability adequate to safely complete daily activities?: Yes Is the  patient deaf or have difficulty hearing?: No Does the patient have difficulty seeing, even when wearing glasses/contacts?: No Does the patient have difficulty concentrating, remembering, or making decisions?: No Patient able to express need for assistance with ADLs?: Yes Does the patient have difficulty dressing or bathing?: No Independently performs ADLs?: Yes (appropriate for developmental age) Does the patient have difficulty walking or climbing stairs?: No Weakness of Legs: Both Weakness of Arms/Hands: Both  Permission Sought/Granted Permission sought to share information with : Family Supports Permission granted to share information with : Yes, Verbal Permission Granted  Share Information with NAME: Despina Pole     Permission granted to share info w Relationship: daughter  Permission granted to share info w Contact Information: [phone]  Emotional Assessment Appearance:: Appears stated age Attitude/Demeanor/Rapport: Engaged Affect (typically observed): Accepting Orientation: : Oriented to Self, Oriented to Place, Oriented to  Time, Oriented to Situation Alcohol / Substance Use: Not Applicable Psych Involvement: No (comment)  Admission diagnosis:  Hypocalcemia [E83.51] Hypokalemia [E87.6] Hypomagnesemia [E83.42] Anemia, unspecified type [D64.9] Left lower quadrant abdominal pain [R10.32] Patient Active Problem List   Diagnosis Date Noted  . Hypokalemia 04/20/2019  . Hypomagnesemia 04/20/2019  . Anemia 04/20/2019  . Patient is Jehovah's Witness 04/20/2019  . Acquired thrombophilia ([place]) 04/05/2019  . Malnutrition of moderate degree 05/17/2016  . Bigeminy   . Intractable vomiting 05/15/2016  . Heart palpitations 05/15/2016  . SBO (small bowel obstruction) ([place]) 05/15/2016  .  Acute coronary syndrome ([place]) 07/15/2015  . Status post Maze operation for atrial fibrillation 01/08/2013  . S/P MVR (mitral valve repair) 01/08/2013  . Atrial flutter ([place]) 10/30/2012   . Amaurosis fugax 10/30/2012  . Dyspnea 10/28/2012  . 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 Thyroid goiter 08/21/2012  . Microscopic hematuria 08/20/2012  . S/P mitral valve repair 06/28/2012  . History of mitral valve prolapse with severe mitral regurgtation 05/19/2012  . Refusal of blood transfusions as patient is Jehovah's Witness 05/19/2012  . Atrial fibrillation ([place]) 05/16/2012  . Anxiety 04/27/2011  . Hypertension 04/26/2011  . Diverticulitis 04/24/2011  . Hyperlipidemia    PCP:  Burnard Bunting, MD Pharmacy:   [id number] [place] [id number]: [phone]: [phone] [facility] [place] [place]Phone: [phone]: [phone]    Social Determinants of Health (SDOH) Interventions    Readmission Risk Interventions No flowsheet data found.

## 2019-04-23 NOTE — Progress Notes (Signed)

## 2019-04-23 NOTE — Progress Notes (Signed)
PROGRESS NOTE    [name] [name]  LYY:[id number] DOB: May 12, 1940 DOA: 04/20/2019 PCP: Geoffry Paradise, MD   Brief Narrative:  HPI per Dr. Lanae Boast on 04/20/2019  [name] [name] is a 78 y.o. female with medical history significant for atrial fibrillation/ H/o MVP, with severe MR 04/2012-s/p MV repair and Maze procedure, CAD-last cath 2017, systolic CHF LVEF 40% in 02/2018, chronic anemia, anxiety disorder, hyperlipidemia unable to tolerate Crestor, history of diverticulitis-last Colonoscopy in 2013, followed by [facility] last seen in 2019 comes to ER for evaluation of shortness of breath, abdominal pain and headache.  Patient was seen on 12/23 at urgent care for abdominal pain CT showed 'showed possible mild diverticulitis, with no other acute pathology (an old L3 endplate compression fracture was noted, seen on prior imaging), placed on Augmentin and discharged home.  She has been taking Augmentin but has been having upset stomach. In the ED, VSS except for HR in 110s.  Labs showed potassium 2.4, mag 1.3, calcium 6.3.  Chest x-ray with no acute cardiopulmonary disease, known right thyroid goiter.  Patient admitted for further management.    Assessment & Plan:   Principal Problem:   Hypokalemia Active Problems:   Diverticulitis   Hypertension   Anxiety   Atrial fibrillation (HCC)   Status post Maze operation for atrial fibrillation   S/P MVR (mitral valve repair)   Hypomagnesemia   Anemia   Patient is Jehovah's Witness   Acute diverticulitis Currently afebrile, with no leukocytosis Repeat CT Abdomen and Pelvis showed no acute findings or evidence of complicated diverticulitis Last colonoscopy in 2013 by [facility] She is being treated with Zosyn and has clinically improved.  Transition to Augmentin Advance diet to soft foods.  Acute on Chronic systolic and diastolic HF No significant overload on examination, reports orthopnea Chest x-ray clear, BNP 224 Repeat ECHO  showed worsening EF of 30-35% and Grade 2 DD with no LVH May need Lasix as needed Strict I's and O's, daily weights  She has not had any chest pain and troponin on admissions were normal.  Suspect her cardiomyopathy is nonischemic secondary to uncontrolled atrial fibrillation.  She will need outpatient cardiology evaluation.  We will also start on low-dose ARB.  Paroxysmal atrial fibrillation with severe MR s/p MV repair/maze procedure HR not controlled, 110s EKG done showed sinus tachy, ??  Anxiety versus hyperthyroidism She is currently on sotalol.  We will add low-dose metoprolol.  If heart rate improves, can consider transitioning to Toprol on discharge. Continue xarelto  ??  Dysphagia Having trouble swallowing pills Seen by speech therapy with recommendations for regular diet with thin liquids.  She only struggles with larger pills.  Malnutrition BMI of 14.79 Nutrition consult  Hyperthyroidism  TSH 1.323 Free T4 0.78, will order T3 Continue methimazole  CAD Chest pain-free Normal cath in 2017  Anxiety Cont home xanax as needed  Patient is Jehovah's Witness  GOC, DNR poA     DVT prophylaxis: Xarelto Code Status: DO NOT RESUSCITATE Family Communication: No family present at bedside Disposition Plan: SNF  Consultants:   None   Procedures:  None    Antimicrobials:  Anti-infectives (From admission, onward)   Start     Dose/Rate Route Frequency Ordered Stop   04/20/19 2200  piperacillin-tazobactam (ZOSYN) IVPB 3.375 g     3.375 g 12.5 mL/hr over 240 Minutes Intravenous Every 8 hours 04/20/19 1314     04/20/19 1315  piperacillin-tazobactam (ZOSYN) IVPB 3.375 g  3.375 g 100 mL/hr over 30 Minutes Intravenous  Once 04/20/19 1314 04/20/19 1540     Subjective: Feels that her abdominal pain is doing better.  No nausea or vomiting.  Has not had a bowel movement several days.  Objective: Vitals:   04/22/19 2124 04/23/19 0416 04/23/19 0558 04/23/19  1353  BP: 138/73  (!) 160/87 (!) 168/74  Pulse: (!) 115  (!) 112 100  Resp: 20  20 19   Temp: 98.2 F (36.8 C)  98.5 F (36.9 C) 98.4 F (36.9 C)  TempSrc: Oral  Oral Oral  SpO2: 100%  99% 100%  Weight:  44.4 kg    Height:        Intake/Output Summary (Last 24 hours) at 04/23/2019 2047 Last data filed at 04/23/2019 1857 Gross per 24 hour  Intake 789.11 ml  Output 950 ml  Net -160.89 ml   Filed Weights   04/21/19 0258 04/23/19 0416  Weight: 42.2 kg 44.4 kg   Examination: Physical Exam: General exam: Alert, awake, oriented x 3 Respiratory system: Clear to auscultation. Respiratory effort normal. Cardiovascular system: Irregular. No murmurs, rubs, gallops. Gastrointestinal system: Abdomen is nondistended, soft and nontender. No organomegaly or masses felt. Normal bowel sounds heard. Central nervous system: Alert and oriented. No focal neurological deficits. Extremities: No C/C/E, +pedal pulses Skin: No rashes, lesions or ulcers  Psychiatry: Judgement and insight appear normal. Mood & affect appropriate.     Data Reviewed: I have personally reviewed following labs and imaging studies  CBC: Recent Labs  Lab 04/18/19 0958 04/20/19 0835 04/21/19 0509 04/21/19 1107 04/21/19 1918 04/22/19 0524 04/23/19 0432  WBC 13.8* 6.1 9.9  --   --  8.6 8.8  NEUTROABS 11.7* 4.9  --   --   --  5.0 5.0  HGB 13.6 8.9* 13.0 12.7 12.3 13.7 13.7  HCT 42.1 28.3* 40.2 38.5 37.6 42.6 42.0  MCV 91.5 94.3 93.1  --   --  91.4 91.3  PLT 208 145* 207  --   --  220 228   Basic Metabolic Panel: Recent Labs  Lab 04/18/19 0958 04/20/19 0835 04/20/19 1326 04/21/19 0509 04/21/19 0838 04/22/19 0524 04/23/19 0432  NA 137 139  --  135  --  135 133*  K 4.1 2.4*  --  3.8  --  3.9 3.7  CL 102 115*  --  103  --  93* 97*  CO2 27 20*  --  23  --  27 29  GLUCOSE 119* 79  --  97  --  98 106*  BUN 14 7*  --  9  --  9 9  CREATININE 0.70 0.43*  --  0.50  --  0.68 0.60  CALCIUM 10.1 6.3* 8.9 9.4  --   9.4 9.9  MG  --  1.3*  --   --  2.3 2.0  --   PHOS  --   --   --   --  3.0 2.6  --    GFR: Estimated Creatinine Clearance: 41.3 mL/min (by C-G formula based on SCr of 0.6 mg/dL). Liver Function Tests: Recent Labs  Lab 04/18/19 0958 04/20/19 0835 04/21/19 0509 04/22/19 0524  AST 45* 16 21 19   ALT 32 16 19 16   ALKPHOS 90 52 66 62  BILITOT 1.0 0.7 1.8* 1.2  PROT 7.3 4.6* 7.1 7.0  ALBUMIN 4.2 2.5* 3.9 3.9   Recent Labs  Lab 04/18/19 0958  LIPASE 29   No results for input(s): AMMONIA in the last  168 hours. Coagulation Profile: Recent Labs  Lab 04/21/19 0509  INR 1.5*   Cardiac Enzymes: No results for input(s): CKTOTAL, CKMB, CKMBINDEX, TROPONINI in the last 168 hours. BNP (last 3 results) No results for input(s): PROBNP in the last 8760 hours. HbA1C: No results for input(s): HGBA1C in the last 72 hours. CBG: No results for input(s): GLUCAP in the last 168 hours. Lipid Profile: No results for input(s): CHOL, HDL, LDLCALC, TRIG, CHOLHDL, LDLDIRECT in the last 72 hours. Thyroid Function Tests: No results for input(s): TSH, T4TOTAL, FREET4, T3FREE, THYROIDAB in the last 72 hours. Anemia Panel: No results for input(s): VITAMINB12, FOLATE, FERRITIN, TIBC, IRON, RETICCTPCT in the last 72 hours. Sepsis Labs: Recent Labs  Lab 04/18/19 0958 04/20/19 1326  LATICACIDVEN 1.1 1.7    Recent Results (from the past 240 hour(s))  SARS CORONAVIRUS 2 (TAT 6-24 HRS) Nasopharyngeal Nasopharyngeal Swab     Status: None   Collection Time: 04/20/19  1:26 PM   Specimen: Nasopharyngeal Swab  Result Value Ref Range Status   SARS Coronavirus 2 NEGATIVE NEGATIVE Final    Comment: (NOTE) SARS-CoV-2 target nucleic acids are NOT DETECTED. The SARS-CoV-2 RNA is generally detectable in upper and lower respiratory specimens during the acute phase of infection. Negative results do not preclude SARS-CoV-2 infection, do not rule out co-infections with other pathogens, and should not be used as  the sole basis for treatment or other patient management decisions. Negative results must be combined with clinical observations, patient history, and epidemiological information. The expected result is Negative. Fact Sheet for Patients: SugarRoll.be Fact Sheet for Healthcare Providers: https://www.woods-mathews.com/ This test is not yet approved or cleared by the [place] FDA and  has been authorized for detection and/or diagnosis of SARS-CoV-2 by FDA under an Emergency Use Authorization (EUA). This EUA will remain  in effect (meaning this test can be used) for the duration of the COVID-19 declaration under Section 56 4(b)(1) of the Act, 21 U.S.C. section 360bbb-3(b)(1), unless the authorization is terminated or revoked sooner. Performed at [facility], [place] [address] [place], [place] [address]     Radiology Studies: DG CHEST PORT 1 VIEW  Result Date: 04/22/2019 CLINICAL DATA:  Shortness of breath today. EXAM: PORTABLE CHEST 1 VIEW COMPARISON:  04/20/2019 and chest CT 07/25/2015 FINDINGS: Patient is slightly rotated to the right. Lungs are hyperexpanded without focal airspace consolidation, effusion or pneumothorax. Opacification over the upper right paramediastinal region with deviation of the trachea to the left compatible with known right thyroid goiter. Mild stable cardiomegaly. Remainder of the exam is unchanged. IMPRESSION: Hyperexpansion without acute cardiopulmonary disease. Mild stable cardiomegaly. Right thyroid goiter. Electronically Signed   By: Marin Olp M.D.   On: 04/22/2019 04:53   Scheduled Meds: . cholecalciferol  1,000 Units Oral Daily  . feeding supplement  1 Container Oral TID BM  . feeding supplement (PRO-STAT SUGAR FREE 64)  30 mL Oral TID BM  . methimazole  5 mg Oral Daily  . multivitamin with minerals  1 tablet Oral Daily  . pantoprazole  40 mg Oral QHS  . Rivaroxaban  15 mg Oral Q supper  . sotalol   40 mg Oral BID   Continuous Infusions: . piperacillin-tazobactam (ZOSYN)  IV 3.375 g (04/23/19 1711)    LOS: 2 days   Kathie Dike, MD [facility]  If 7PM-7AM, please contact night-coverage [email or web address]

## 2019-04-23 NOTE — NC FL2 (Signed)
[place] MEDICAID FL2 LEVEL OF CARE SCREENING TOOL     IDENTIFICATION  Patient Name: [name] [name] Birthdate: [birth year] Sex: female Admission Date (Current Location): 04/20/2019  [facility] and [place] Number:  Producer, television/film/video and Address:  [facility],  [address] [place] [address]      Provider Number: [id number]  Attending Physician Name and Address:  Erick Blinks, MD  Relative Name and Phone Number:       Current Level of Care: Hospital Recommended Level of Care: Skilled Nursing Facility Prior Approval Number:    Date Approved/Denied:   PASRR Number: [id number]  Discharge Plan: SNF    Current Diagnoses: Patient Active Problem List   Diagnosis Date Noted  . Hypokalemia 04/20/2019  . Hypomagnesemia 04/20/2019  . Anemia 04/20/2019  . Patient is Jehovah's Witness 04/20/2019  . Acquired thrombophilia (HCC) 04/05/2019  . Malnutrition of moderate degree 05/17/2016  . Bigeminy   . Intractable vomiting 05/15/2016  . Heart palpitations 05/15/2016  . SBO (small bowel obstruction) (HCC) 05/15/2016  . Acute coronary syndrome (HCC) 07/15/2015  . Status post Maze operation for atrial fibrillation 01/08/2013  . S/P MVR (mitral valve repair) 01/08/2013  . Atrial flutter (HCC) 10/30/2012  . Amaurosis fugax 10/30/2012  . Dyspnea 10/28/2012  . 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 Thyroid goiter 08/21/2012  . Microscopic hematuria 08/20/2012  . S/P mitral valve repair 06/28/2012  . History of mitral valve prolapse with severe mitral regurgtation 05/19/2012  . Refusal of blood transfusions as patient is Jehovah's Witness 05/19/2012  . Atrial fibrillation (HCC) 05/16/2012  . Anxiety 04/27/2011  . Hypertension 04/26/2011  . Diverticulitis 04/24/2011  . Hyperlipidemia     Orientation RESPIRATION BLADDER Height & Weight     Self, Time, Situation, Place  Normal Incontinent, External catheter Weight: 97 lb 14.2 oz (44.4 kg) Height:   5' 6.5" (168.9 cm)  BEHAVIORAL SYMPTOMS/MOOD NEUROLOGICAL BOWEL NUTRITION STATUS      Continent Diet(full liquid)  AMBULATORY STATUS COMMUNICATION OF NEEDS Skin   Extensive Assist Verbally                         Personal Care Assistance Level of Assistance  Bathing, Dressing, Feeding Bathing Assistance: Limited assistance Feeding assistance: Independent Dressing Assistance: Limited assistance     Functional Limitations Info  Sight, Hearing, Speech Sight Info: Adequate Hearing Info: Adequate Speech Info: Adequate    SPECIAL CARE FACTORS FREQUENCY  PT (By licensed PT), OT (By licensed OT)     PT Frequency: 5x wk OT Frequency: 5x wk            Contractures      Additional Factors Info  Code Status, Allergies Code Status Info: DNR Allergies Info: Amiodarone Diltiazem Macrobid Nitrofurantoin Monohyd Macro Mexiletine Aspirin Naproxen Sodium Sulfa Drugs Cross Reactors           Current Medications (04/23/2019):  This is the current hospital active medication list Current Facility-Administered Medications  Medication Dose Route Frequency Provider Last Rate Last Admin  . acetaminophen (TYLENOL) tablet 650 mg  650 mg Oral Q6H PRN Kc, Ramesh, MD   650 mg at 04/22/19 1748   Or  . acetaminophen (TYLENOL) suppository 650 mg  650 mg Rectal Q6H PRN Kc, Ramesh, MD      . ALPRAZolam Prudy Feeler) tablet 0.125-0.25 mg  0.125-0.25 mg Oral BID PRN Lanae Boast, MD   0.25 mg at 04/22/19 1439  . cholecalciferol (VITAMIN D) tablet 1,000 Units  1,000 Units Oral Daily Antonieta Pert, MD   1,000 Units at 04/23/19 0830  . fluticasone (FLONASE) 50 MCG/ACT nasal spray 1 spray  1 spray Each Nare Daily PRN Kc, Ramesh, MD      . HYDROcodone-acetaminophen (NORCO/VICODIN) 5-325 MG per tablet 1 tablet  1 tablet Oral Q6H PRN Alma Friendly, MD   1 tablet at 04/23/19 0830  . methimazole (TAPAZOLE) tablet 5 mg  5 mg Oral Daily Kc, Ramesh, MD   5 mg at 04/23/19 0831  . pantoprazole (PROTONIX) injection  40 mg  40 mg Intravenous Q24H Kc, Ramesh, MD   40 mg at 04/22/19 2310  . piperacillin-tazobactam (ZOSYN) IVPB 3.375 g  3.375 g Intravenous Q8H Thomes Lolling, RPH 12.5 mL/hr at 04/23/19 0835 3.375 g at 04/23/19 0835  . Rivaroxaban (XARELTO) tablet 15 mg  15 mg Oral Q supper Raiford Noble Moore, DO   15 mg at 04/22/19 1748  . sotalol (BETAPACE) tablet 40 mg  40 mg Oral BID Antonieta Pert, MD   40 mg at 04/23/19 0831  . traMADol (ULTRAM) tablet 50 mg  50 mg Oral TID PRN Antonieta Pert, MD   50 mg at 04/23/19 0411     Discharge Medications: Please see discharge summary for a list of discharge medications.  Relevant Imaging Results:  Relevant Lab Results:   Additional Information SS#: [id number].  Wende Neighbors, LCSW

## 2019-04-23 NOTE — Plan of Care (Signed)

## 2019-04-23 NOTE — Progress Notes (Signed)
Initial Nutrition Assessment  DOCUMENTATION CODES:   Severe malnutrition in context of chronic illness, Underweight  INTERVENTION:  - will order Boost Breeze TID, each supplement provides 250 kcal and 9 grams of protein. - will order 30 mL Prostat TID, each supplement provides 100 kcal and 15 grams of protein. - will order daily multivitamin with minerals.    NUTRITION DIAGNOSIS:   Severe Malnutrition related to chronic illness as evidenced by severe fat depletion, severe muscle depletion.  GOAL:   Patient will meet greater than or equal to 90% of their needs  MONITOR:   PO intake, Supplement acceptance, Labs, Weight trends  REASON FOR ASSESSMENT:   Consult Assessment of nutrition requirement/status  ASSESSMENT:   78 y.o. female with medical history of atrial fibrillation,  MVP with severe MR 04/2012-s/p MV repair and Maze procedure, CAD-last cath 2017, CHF, chronic anemia, anxiety disorder, hyperlipidemia unable to tolerate Crestor, and history of diverticulitis. She presented to the ED for SOB, abdominal pain, and headache. She was seen at Urgent Care on 12/23 for abdominal pain and CT at that time showed possible mild diverticulitis. She was started on augmentin and sent home.  Patient has been eating 25-50% of meals since diet advanced from CLD to FLD on 12/26 at [place]. Patient reports that abdominal pain has subsided since admission and that she is able to tolerate small quantities at a time of meals without pain exacerbation or nausea.  Prior to acute illness, appetite was at baseline. She would often eat 2-3 meals/day and sometimes eat snacks. She does not care for oral nutrition supplements, but is open to trying to consume some of them during hospitalization.   She has noticed difficulty with swallowing pills recently, but has not had difficulties chewing or swallowing foods or beverages. She does wear a partial and it fits well.   SLP saw patient yesterday and note  indicates that patient reported difficulty with swallowing pills, specifically larger pills, is a chronic rather than acute issue and that it has not worsened recently.   Per chart review, weight on admission (12/26) was 93 lb and weight today is 98 lb. Weight has been stable since August. Weight on 07/04/18 was 102 lb. This indicates 9 lb weight loss (8.8% body weight) in the past 9.5 months; not significant for time frame.  Per notes: - acute diverticulitis--repeat CT abd/pelvis showed no acute findings; continue FLD and advance as tolerated - acute on CHF--possible need for lasix this admission - possible dysphagia, mainly with pills - hyperthyroidism    Labs reviewed; Na: 133 mmol/l, Cl: 97 mmol/l. Medications reviewed; 1000 units cholecalciferol/day, 400 mg mag-ox BID x3 days (12/25-12/27), 40 mg oral protonix/day.     NUTRITION - FOCUSED PHYSICAL EXAM:    Most Recent Value  Orbital Region  Moderate depletion  Upper Arm Region  Severe depletion  Thoracic and Lumbar Region  Severe depletion  Buccal Region  Moderate depletion  Temple Region  Moderate depletion  Clavicle Bone Region  Severe depletion  Clavicle and Acromion Bone Region  Severe depletion  Scapular Bone Region  Unable to assess  Dorsal Hand  Moderate depletion  Patellar Region  Unable to assess  Anterior Thigh Region  Unable to assess  Posterior Calf Region  Unable to assess  Edema (RD Assessment)  Unable to assess  Hair  Reviewed  Eyes  Reviewed  Mouth  Reviewed  Skin  Reviewed  Nails  Reviewed       Diet Order:   Diet  Order            Diet full liquid Room service appropriate? Yes; Fluid consistency: Thin  Diet effective now              EDUCATION NEEDS:   Not appropriate for education at this time  Skin:  Skin Assessment: Reviewed RN Assessment  Last BM:  PTA/unknown  Height:   Ht Readings from Last 1 Encounters:  04/21/19 5' 6.5" (1.689 m)    Weight:   Wt Readings from Last 1  Encounters:  04/23/19 44.4 kg    Ideal Body Weight:  60.2 kg  BMI:  Body mass index is 15.56 kg/m.  Estimated Nutritional Needs:   Kcal:  1555-1775 kcal  Protein:  78-90 grams  Fluid:  >/= 1.8 L/day     Trenton Gammon, MS, RD, LDN, [facility] Inpatient Clinical Dietitian Pager # [phone] After hours/weekend pager # [phone]

## 2019-04-24 DIAGNOSIS — E43 Unspecified severe protein-calorie malnutrition: Secondary | ICD-10-CM | POA: Insufficient documentation

## 2019-04-24 LAB — CBC WITH DIFFERENTIAL/PLATELET
Abs Immature Granulocytes: 0.03 10*3/uL (ref 0.00–0.07)
Basophils Absolute: 0 10*3/uL (ref 0.0–0.1)
Basophils Relative: 0 %
Eosinophils Absolute: 0.1 10*3/uL (ref 0.0–0.5)
Eosinophils Relative: 1 %
HCT: 45.5 % (ref 36.0–46.0)
Hemoglobin: 14.9 g/dL (ref 12.0–15.0)
Immature Granulocytes: 0 %
Lymphocytes Relative: 16 %
Lymphs Abs: 1.7 10*3/uL (ref 0.7–4.0)
MCH: 29.4 pg (ref 26.0–34.0)
MCHC: 32.7 g/dL (ref 30.0–36.0)
MCV: 89.7 fL (ref 80.0–100.0)
Monocytes Absolute: 1 10*3/uL (ref 0.1–1.0)
Monocytes Relative: 9 %
Neutro Abs: 8.2 10*3/uL — ABNORMAL HIGH (ref 1.7–7.7)
Neutrophils Relative %: 74 %
Platelets: 278 10*3/uL (ref 150–400)
RBC: 5.07 MIL/uL (ref 3.87–5.11)
RDW: 13.7 % (ref 11.5–15.5)
WBC: 11 10*3/uL — ABNORMAL HIGH (ref 4.0–10.5)
nRBC: 0 % (ref 0.0–0.2)

## 2019-04-24 LAB — BASIC METABOLIC PANEL
Anion gap: 10 (ref 5–15)
BUN: 10 mg/dL (ref 8–23)
CO2: 26 mmol/L (ref 22–32)
Calcium: 9.9 mg/dL (ref 8.9–10.3)
Chloride: 97 mmol/L — ABNORMAL LOW (ref 98–111)
Creatinine, Ser: 0.69 mg/dL (ref 0.44–1.00)
GFR calc Af Amer: >60 mL/min (ref >60)
GFR calc non Af Amer: >60 mL/min (ref >60)
Glucose, Bld: 135 mg/dL — ABNORMAL HIGH (ref 70–99)
Potassium: 3.6 mmol/L (ref 3.5–5.1)
Sodium: 133 mmol/L — ABNORMAL LOW (ref 135–145)

## 2019-04-24 LAB — SARS CORONAVIRUS 2 (TAT 6-24 HRS): SARS Coronavirus 2: NEGATIVE

## 2019-04-24 LAB — T3: T3, Total: 105 ng/dL (ref 71–180)

## 2019-04-24 MED ORDER — SENNOSIDES-DOCUSATE SODIUM 8.6-50 MG PO TABS
1.0000 | ORAL_TABLET | Freq: Two times a day (BID) | ORAL | Status: DC
  Administered 2019-04-24 – 2019-04-25 (×3): 1 via ORAL
  Filled 2019-04-24 (×3): qty 1

## 2019-04-24 MED ORDER — METOPROLOL TARTRATE 25 MG PO TABS
37.5000 mg | ORAL_TABLET | Freq: Two times a day (BID) | ORAL | Status: DC
  Administered 2019-04-24 – 2019-04-25 (×2): 37.5 mg via ORAL
  Filled 2019-04-24 (×2): qty 2

## 2019-04-24 MED ORDER — ONDANSETRON HCL 4 MG/2ML IJ SOLN
4.0000 mg | Freq: Three times a day (TID) | INTRAMUSCULAR | Status: DC
  Administered 2019-04-25: 4 mg via INTRAVENOUS

## 2019-04-24 NOTE — Progress Notes (Signed)
Physical Therapy Treatment Patient Details Name: [name] [name] MRN: [id number] DOB: 1940/07/16 Today's Date: 04/24/2019    History of Present Illness 78 y.o. female with medical history significant for atrial fibrillation/ H/o MVP, with severe MR 04/2012-s/p MV repair, CAD- systolic CHF, chronic anemia, anxiety disorder, hyperlipidemia unable to tolerate Crestor history of diverticulitis-last Colonoscopy in 2013, followed by [facility] last seen in 2019 comes to ER for evaluation of shortness of breath abdominal pain and headache.  Patient was seen on 12/23 at urgent care for abdominal pain CT showed 'showed possible mild diverticulitis, [place] home. H/O an old L3 endplate compression fracture.  Complains of headache , Sob and DOE ,abdominal pain.Repeat echo showed slightly worsening EF and grade 2 diastolic dysfunction.    PT Comments    Some encouragement required for progression of activity. Pt is slowly progressing with mobility. Continue to recommend SNF.    Follow Up Recommendations  SNF     Equipment Recommendations  None recommended by PT    Recommendations for Other Services       Precautions / Restrictions Precautions Precautions: Fall Precaution Comments: back pain Restrictions Weight Bearing Restrictions: No    Mobility  Bed Mobility Overal bed mobility: Needs Assistance Bed Mobility: Supine to Sit     Supine to sit: Min guard;HOB elevated     General bed mobility comments: Increased time. Close guard for safety.  Transfers Overall transfer level: Needs assistance Equipment used: Rolling walker (2 wheeled) Transfers: Sit to/from Stand Sit to Stand: Min assist;From elevated surface         General transfer comment: Assist to rise, stabilize, control descent. VCs safety, hand placement. Unsteady.  Ambulation/Gait Ambulation/Gait assistance: Min assist Gait Distance (Feet): 20 Feet Assistive device: Rolling walker (2 wheeled) Gait Pattern/deviations:  Step-through pattern;Decreased stride length     General Gait Details: Unsteady. LOB x 1. Assist to stabilize throughout distance. Pt is weak and fatigues easily. She is at risk for falls when ambulating.   Stairs             Wheelchair Mobility    Modified Rankin (Stroke Patients Only)       Balance Overall balance assessment: Needs assistance         Standing balance support: Bilateral upper extremity supported Standing balance-Leahy Scale: Poor                              Cognition Arousal/Alertness: Awake/alert Behavior During Therapy: WFL for tasks assessed/performed Overall Cognitive Status: Within Functional Limits for tasks assessed                                 General Comments: a bit talkative      Exercises      General Comments        Pertinent Vitals/Pain Pain Assessment: Faces Faces Pain Scale: Hurts little more Pain Location: back, lower abd Pain Descriptors / Indicators: Discomfort;Sore;Grimacing Pain Intervention(s): Limited activity within patient's tolerance;Monitored during session;Repositioned    Home Living                      Prior Function            PT Goals (current goals can now be found in the care plan section) Progress towards PT goals: Progressing toward goals    Frequency    Min 2X/week  PT Plan Current plan remains appropriate    Co-evaluation              AM-PAC PT "6 Clicks" Mobility   Outcome Measure  Help needed turning from your back to your side while in a flat bed without using bedrails?: A Little Help needed moving from lying on your back to sitting on the side of a flat bed without using bedrails?: A Little Help needed moving to and from a bed to a chair (including a wheelchair)?: A Little Help needed standing up from a chair using your arms (e.g., wheelchair or bedside chair)?: A Little Help needed to walk in hospital room?: A Little Help  needed climbing 3-5 steps with a railing? : A Lot 6 Click Score: 17    End of Session Equipment Utilized During Treatment: Gait belt Activity Tolerance: Patient limited by fatigue Patient left: in chair;with call bell/phone within reach;with chair alarm set   PT Visit Diagnosis: Difficulty in walking, not elsewhere classified (R26.2);Muscle weakness (generalized) (M62.81)     Time: 2979-8921 PT Time Calculation (min) (ACUTE ONLY): 20 min  Charges:  $Gait Training: 8-22 mins                        Faye Ramsay, PT Acute Rehabilitation Services

## 2019-04-24 NOTE — Progress Notes (Signed)
PROGRESS NOTE    [name] [name]  CVE:[id number] DOB: 1940-12-11 DOA: 04/20/2019 PCP: Burnard Bunting, MD   Brief Narrative:  HPI per Dr. Antonieta Pert on 04/20/2019  [name] [name] is a 78 y.o. female with medical history significant for atrial fibrillation/ H/o MVP, with severe MR 04/2012-s/p MV repair and Maze procedure, CAD-last cath 0258, systolic CHF LVEF 52% in 77/8242, chronic anemia, anxiety disorder, hyperlipidemia unable to tolerate Crestor, history of diverticulitis-last Colonoscopy in 2013, followed by [facility] last seen in 2019 comes to ER for evaluation of shortness of breath, abdominal pain and headache.  Patient was seen on 12/23 at urgent care for abdominal pain CT showed 'showed possible mild diverticulitis, with no other acute pathology (an old L3 endplate compression fracture was noted, seen on prior imaging), placed on Augmentin and discharged home.  She has been taking Augmentin but has been having upset stomach. In the ED, VSS except for HR in 110s.  Labs showed potassium 2.4, mag 1.3, calcium 6.3.  Chest x-ray with no acute cardiopulmonary disease, known right thyroid goiter.  Patient admitted for further management.    Assessment & Plan:   Principal Problem:   Hypokalemia Active Problems:   Diverticulitis   Hypertension   Anxiety   Atrial fibrillation (HCC)   Status post Maze operation for atrial fibrillation   S/P MVR (mitral valve repair)   Hypomagnesemia   Anemia   Patient is Jehovah's Witness   Protein-calorie malnutrition, severe   Acute diverticulitis Currently afebrile, with no leukocytosis Repeat CT Abdomen and Pelvis showed no acute findings or evidence of complicated diverticulitis Last colonoscopy in 2013 by [facility] She is being treated with Zosyn and has clinically improved.  Transition to Augmentin Tolerating soft diet.  We will continue the same for now.  Acute on Chronic systolic and diastolic HF No significant overload on  examination, reports orthopnea Chest x-ray clear, BNP 224 Repeat ECHO showed worsening EF of 30-35% and Grade 2 DD with no LVH May need Lasix as needed Strict I's and O's, daily weights  She has not had any chest pain and troponin on admissions were normal.  Suspect her cardiomyopathy is nonischemic secondary to uncontrolled atrial fibrillation.  She will need outpatient cardiology evaluation.  We will also start on low-dose ARB.  Paroxysmal atrial fibrillation with severe MR s/p MV repair/maze procedure HR not controlled, 110s EKG done showed sinus tachy, ??  Anxiety versus hyperthyroidism She is currently on sotalol.  We will add low-dose metoprolol.  If heart rate improves, can consider transitioning to Toprol on discharge. Continue xarelto  ??  Dysphagia Having trouble swallowing pills Seen by speech therapy with recommendations for regular diet with thin liquids.  She only struggles with larger pills.  Malnutrition BMI of 14.79 Nutrition consult  Hyperthyroidism  TSH 1.323 Free T4 0.78, will order T3 Continue methimazole  CAD Chest pain-free Normal cath in 2017  Anxiety Cont home xanax as needed  Patient is Jehovah's Witness  GOC, DNR poA     DVT prophylaxis: Xarelto Code Status: DO NOT RESUSCITATE Family Communication: No family present at bedside Disposition Plan: SNF, medically ready.  We will continue stool softeners.  Consultants:   None   Procedures:  None    Antimicrobials:  Anti-infectives (From admission, onward)   Start     Dose/Rate Route Frequency Ordered Stop   04/23/19 2200  amoxicillin-clavulanate (AUGMENTIN) 875-125 MG per tablet 1 tablet     1 tablet Oral Every 12 hours 04/23/19  2047     04/20/19 2200  piperacillin-tazobactam (ZOSYN) IVPB 3.375 g  Status:  Discontinued     3.375 g 12.5 mL/hr over 240 Minutes Intravenous Every 8 hours 04/20/19 1314 04/23/19 2047   04/20/19 1315  piperacillin-tazobactam (ZOSYN) IVPB 3.375 g      3.375 g 100 mL/hr over 30 Minutes Intravenous  Once 04/20/19 1314 04/20/19 1540     Subjective: Reports pain while she had blood work done this morning.  Also reports back pain.  No nausea no vomiting.  No fever no chills.  Passing gas but no BM so far.  Objective: Vitals:   04/23/19 2118 04/24/19 0609 04/24/19 0619 04/24/19 1158  BP: (!) 156/82  139/66 (!) 103/59  Pulse: (!) 115  (!) 116 (!) 115  Resp: 16  18 16   Temp: 99.6 F (37.6 C)  97.9 F (36.6 C) 97.9 F (36.6 C)  TempSrc: Oral  Oral Oral  SpO2: 98%  99% 99%  Weight:  42.3 kg    Height:        Intake/Output Summary (Last 24 hours) at 04/24/2019 1908 Last data filed at 04/24/2019 1700 Gross per 24 hour  Intake 690 ml  Output 700 ml  Net -10 ml   Filed Weights   04/21/19 0258 04/23/19 0416 04/24/19 0609  Weight: 42.2 kg 44.4 kg 42.3 kg   Examination: Physical Exam: General exam: Alert, awake, oriented x 3 Respiratory system: Clear to auscultation. Respiratory effort normal. Cardiovascular system: Irregular. No murmurs, rubs, gallops. Gastrointestinal system: Abdomen is nondistended, soft and nontender. No organomegaly or masses felt. Normal bowel sounds heard. Central nervous system: Alert and oriented. No focal neurological deficits. Extremities: No C/C/E, +pedal pulses Skin: No rashes, lesions or ulcers  Psychiatry: Judgement and insight appear normal. Mood & affect appropriate.     Data Reviewed: I have personally reviewed following labs and imaging studies  CBC: Recent Labs  Lab 04/18/19 0958 04/20/19 0835 04/21/19 0509 04/21/19 1107 04/21/19 1918 04/22/19 0524 04/23/19 0432 04/24/19 0534  WBC 13.8* 6.1 9.9  --   --  8.6 8.8 11.0*  NEUTROABS 11.7* 4.9  --   --   --  5.0 5.0 8.2*  HGB 13.6 8.9* 13.0 12.7 12.3 13.7 13.7 14.9  HCT 42.1 28.3* 40.2 38.5 37.6 42.6 42.0 45.5  MCV 91.5 94.3 93.1  --   --  91.4 91.3 89.7  PLT 208 145* 207  --   --  220 228 278   Basic Metabolic Panel: Recent Labs    Lab 04/20/19 0835 04/20/19 1326 04/21/19 0509 04/21/19 0838 04/22/19 0524 04/23/19 0432 04/24/19 0534  NA 139  --  135  --  135 133* 133*  K 2.4*  --  3.8  --  3.9 3.7 3.6  CL 115*  --  103  --  93* 97* 97*  CO2 20*  --  23  --  27 29 26   GLUCOSE 79  --  97  --  98 106* 135*  BUN 7*  --  9  --  9 9 10   CREATININE 0.43*  --  0.50  --  0.68 0.60 0.69  CALCIUM 6.3* 8.9 9.4  --  9.4 9.9 9.9  MG 1.3*  --   --  2.3 2.0  --   --   PHOS  --   --   --  3.0 2.6  --   --    GFR: Estimated Creatinine Clearance: 39.3 mL/min (by C-G formula based on SCr  of 0.69 mg/dL). Liver Function Tests: Recent Labs  Lab 04/18/19 0958 04/20/19 0835 04/21/19 0509 04/22/19 0524  AST 45* 16 21 19   ALT 32 16 19 16   ALKPHOS 90 52 66 62  BILITOT 1.0 0.7 1.8* 1.2  PROT 7.3 4.6* 7.1 7.0  ALBUMIN 4.2 2.5* 3.9 3.9   Recent Labs  Lab 04/18/19 0958  LIPASE 29   No results for input(s): AMMONIA in the last 168 hours. Coagulation Profile: Recent Labs  Lab 04/21/19 0509  INR 1.5*   Cardiac Enzymes: No results for input(s): CKTOTAL, CKMB, CKMBINDEX, TROPONINI in the last 168 hours. BNP (last 3 results) No results for input(s): PROBNP in the last 8760 hours. HbA1C: No results for input(s): HGBA1C in the last 72 hours. CBG: No results for input(s): GLUCAP in the last 168 hours. Lipid Profile: No results for input(s): CHOL, HDL, LDLCALC, TRIG, CHOLHDL, LDLDIRECT in the last 72 hours. Thyroid Function Tests: No results for input(s): TSH, T4TOTAL, FREET4, T3FREE, THYROIDAB in the last 72 hours. Anemia Panel: No results for input(s): VITAMINB12, FOLATE, FERRITIN, TIBC, IRON, RETICCTPCT in the last 72 hours. Sepsis Labs: Recent Labs  Lab 04/18/19 0958 04/20/19 1326  LATICACIDVEN 1.1 1.7    Recent Results (from the past 240 hour(s))  SARS CORONAVIRUS 2 (TAT 6-24 HRS) Nasopharyngeal Nasopharyngeal Swab     Status: None   Collection Time: 04/20/19  1:26 PM   Specimen: Nasopharyngeal Swab  Result  Value Ref Range Status   SARS Coronavirus 2 NEGATIVE NEGATIVE Final    Comment: (NOTE) SARS-CoV-2 target nucleic acids are NOT DETECTED. The SARS-CoV-2 RNA is generally detectable in upper and lower respiratory specimens during the acute phase of infection. Negative results do not preclude SARS-CoV-2 infection, do not rule out co-infections with other pathogens, and should not be used as the sole basis for treatment or other patient management decisions. Negative results must be combined with clinical observations, patient history, and epidemiological information. The expected result is Negative. Fact Sheet for Patients: 04/22/19 Fact Sheet for Healthcare Providers: 04/22/19 This test is not yet approved or cleared by the [place] FDA and  has been authorized for detection and/or diagnosis of SARS-CoV-2 by FDA under an Emergency Use Authorization (EUA). This EUA will remain  in effect (meaning this test can be used) for the duration of the COVID-19 declaration under Section 56 4(b)(1) of the Act, 21 U.S.C. section 360bbb-3(b)(1), unless the authorization is terminated or revoked sooner. Performed at [facility], [address], [place], [address] [place]     Radiology Studies: No results found. Scheduled Meds: . amoxicillin-clavulanate  1 tablet Oral Q12H  . cholecalciferol  1,000 Units Oral Daily  . feeding supplement  1 Container Oral TID BM  . feeding supplement (PRO-STAT SUGAR FREE 64)  30 mL Oral TID BM  . losartan  25 mg Oral Daily  . methimazole  5 mg Oral Daily  . metoprolol tartrate  37.5 mg Oral BID  . multivitamin with minerals  1 tablet Oral Daily  . pantoprazole  40 mg Oral QHS  . polyethylene glycol  17 g Oral Daily  . Rivaroxaban  15 mg Oral Q supper  . senna-docusate  1 tablet Oral BID  . sotalol  40 mg Oral BID   Continuous Infusions:   LOS: 3 days   [place], MD [facility]  If 7PM-7AM, please contact night-coverage [email or web address]

## 2019-04-24 NOTE — Progress Notes (Signed)
Md aware of pt hr. She has been at a muse of 2 throughout day.

## 2019-04-24 NOTE — Care Management Important Message (Signed)
Important Message  Patient Details IM Letter given to [place] Case Manager to present to the Patient Name: [name] [name] MRN: [id number] Date of Birth: 26-Mar-1941   Medicare Important Message Given:  Yes     [name] 04/24/2019, 11:23 AM

## 2019-04-24 NOTE — TOC Progression Note (Signed)
Transition of Care [facility]) - Progression Note    Patient Details  Name: KEYASHA MIAH MRN: [id number] Date of Birth: [birth year]  Transition of Care [facility]) CM/SW [place], LCSW Phone Number: 04/24/2019, 2:35 PM  Clinical Narrative:   Patient has chosen [facility] care. CSW started authorization through insurance. Covid test pending     Expected Discharge Plan: [place] Barriers to Discharge: Ship broker, Continued Medical Work up  Expected Discharge Plan and Services Expected Discharge Plan: [place] In-house Referral: Clinical Social Work   Post Acute Care Choice: [place] Living arrangements for the past 2 months: Single Family Home                                       Social Determinants of Health (SDOH) Interventions    Readmission Risk Interventions No flowsheet data found.

## 2019-04-25 DIAGNOSIS — I340 Nonrheumatic mitral (valve) insufficiency: Secondary | ICD-10-CM | POA: Diagnosis not present

## 2019-04-25 DIAGNOSIS — I5043 Acute on chronic combined systolic (congestive) and diastolic (congestive) heart failure: Secondary | ICD-10-CM | POA: Diagnosis not present

## 2019-04-25 DIAGNOSIS — R4182 Altered mental status, unspecified: Secondary | ICD-10-CM | POA: Diagnosis not present

## 2019-04-25 DIAGNOSIS — E43 Unspecified severe protein-calorie malnutrition: Secondary | ICD-10-CM

## 2019-04-25 DIAGNOSIS — M2550 Pain in unspecified joint: Secondary | ICD-10-CM | POA: Diagnosis not present

## 2019-04-25 DIAGNOSIS — K5792 Diverticulitis of intestine, part unspecified, without perforation or abscess without bleeding: Secondary | ICD-10-CM | POA: Diagnosis not present

## 2019-04-25 DIAGNOSIS — N390 Urinary tract infection, site not specified: Secondary | ICD-10-CM | POA: Diagnosis not present

## 2019-04-25 DIAGNOSIS — F29 Unspecified psychosis not due to a substance or known physiological condition: Secondary | ICD-10-CM | POA: Diagnosis not present

## 2019-04-25 DIAGNOSIS — Z9889 Other specified postprocedural states: Secondary | ICD-10-CM | POA: Diagnosis not present

## 2019-04-25 DIAGNOSIS — I4891 Unspecified atrial fibrillation: Secondary | ICD-10-CM | POA: Diagnosis not present

## 2019-04-25 DIAGNOSIS — Z7401 Bed confinement status: Secondary | ICD-10-CM | POA: Diagnosis not present

## 2019-04-25 DIAGNOSIS — D649 Anemia, unspecified: Secondary | ICD-10-CM | POA: Diagnosis not present

## 2019-04-25 DIAGNOSIS — I480 Paroxysmal atrial fibrillation: Secondary | ICD-10-CM | POA: Diagnosis not present

## 2019-04-25 DIAGNOSIS — I482 Chronic atrial fibrillation, unspecified: Secondary | ICD-10-CM | POA: Diagnosis not present

## 2019-04-25 DIAGNOSIS — I959 Hypotension, unspecified: Secondary | ICD-10-CM | POA: Diagnosis not present

## 2019-04-25 DIAGNOSIS — E0590 Thyrotoxicosis, unspecified without thyrotoxic crisis or storm: Secondary | ICD-10-CM | POA: Diagnosis not present

## 2019-04-25 DIAGNOSIS — F411 Generalized anxiety disorder: Secondary | ICD-10-CM | POA: Diagnosis not present

## 2019-04-25 DIAGNOSIS — R1084 Generalized abdominal pain: Secondary | ICD-10-CM | POA: Diagnosis not present

## 2019-04-25 DIAGNOSIS — I10 Essential (primary) hypertension: Secondary | ICD-10-CM | POA: Diagnosis not present

## 2019-04-25 DIAGNOSIS — M6281 Muscle weakness (generalized): Secondary | ICD-10-CM | POA: Diagnosis not present

## 2019-04-25 DIAGNOSIS — E876 Hypokalemia: Secondary | ICD-10-CM | POA: Diagnosis not present

## 2019-04-25 DIAGNOSIS — R141 Gas pain: Secondary | ICD-10-CM | POA: Diagnosis not present

## 2019-04-25 DIAGNOSIS — D6869 Other thrombophilia: Secondary | ICD-10-CM | POA: Diagnosis not present

## 2019-04-25 DIAGNOSIS — I5022 Chronic systolic (congestive) heart failure: Secondary | ICD-10-CM | POA: Diagnosis not present

## 2019-04-25 DIAGNOSIS — Z8679 Personal history of other diseases of the circulatory system: Secondary | ICD-10-CM | POA: Diagnosis not present

## 2019-04-25 DIAGNOSIS — Z789 Other specified health status: Secondary | ICD-10-CM | POA: Diagnosis not present

## 2019-04-25 DIAGNOSIS — F419 Anxiety disorder, unspecified: Secondary | ICD-10-CM | POA: Diagnosis not present

## 2019-04-25 MED ORDER — LOSARTAN POTASSIUM 25 MG PO TABS: 25.0000 mg | ORAL_TABLET | Freq: Every day | ORAL | Status: DC

## 2019-04-25 MED ORDER — ADULT MULTIVITAMIN W/MINERALS CH: 1.0000 | ORAL_TABLET | Freq: Every day | ORAL | Status: AC

## 2019-04-25 MED ORDER — HYDROCODONE-ACETAMINOPHEN 5-325 MG PO TABS: 1.0000 | ORAL_TABLET | Freq: Four times a day (QID) | ORAL | 0 refills | Status: AC | PRN

## 2019-04-25 MED ORDER — ONDANSETRON HCL 4 MG/2ML IJ SOLN
INTRAMUSCULAR | Status: AC
  Filled 2019-04-25: qty 2

## 2019-04-25 MED ORDER — ONDANSETRON HCL 4 MG/2ML IJ SOLN: 4.0000 mg | Freq: Four times a day (QID) | INTRAMUSCULAR | Status: DC | PRN

## 2019-04-25 MED ORDER — METOPROLOL TARTRATE 37.5 MG PO TABS: 37.5000 mg | ORAL_TABLET | Freq: Two times a day (BID) | ORAL | Status: DC

## 2019-04-25 MED ORDER — ALPRAZOLAM 0.25 MG PO TABS: 0.1250 mg | ORAL_TABLET | Freq: Two times a day (BID) | ORAL | 0 refills | Status: DC | PRN

## 2019-04-25 MED ORDER — POLYETHYLENE GLYCOL 3350 17 G PO PACK: 17.0000 g | PACK | Freq: Every day | ORAL | 0 refills | Status: AC

## 2019-04-25 MED ORDER — SENNOSIDES-DOCUSATE SODIUM 8.6-50 MG PO TABS: 1.0000 | ORAL_TABLET | Freq: Two times a day (BID) | ORAL | Status: DC

## 2019-04-25 MED ORDER — AMOXICILLIN-POT CLAVULANATE 875-125 MG PO TABS: 1.0000 | ORAL_TABLET | Freq: Two times a day (BID) | ORAL | 0 refills | Status: AC

## 2019-04-25 NOTE — Discharge Summary (Signed)
Discharge Summary  [name] [name]:[id number] DOB: 1940/05/06  PCP: Geoffry Paradise, MD  Admit date: 04/20/2019 Discharge date: 04/25/2019  Time spent: 35 mins  Recommendations for Outpatient Follow-up:  1. PCP in 1 week 2. Cardiology as scheduled for follow up  Discharge Diagnoses:  Active Hospital Problems   Diagnosis Date Noted   Hypokalemia 04/20/2019   Protein-calorie malnutrition, severe 04/24/2019   Hypomagnesemia 04/20/2019   Anemia 04/20/2019   Patient is Jehovah's Witness 04/20/2019   S/P MVR (mitral valve repair) 01/08/2013   Status post Maze operation for atrial fibrillation 01/08/2013   Atrial fibrillation (HCC) 05/16/2012   Anxiety 04/27/2011   Hypertension 04/26/2011   Diverticulitis 04/24/2011    Resolved Hospital Problems  No resolved problems to display.    Discharge Condition: Stable  Diet recommendation: As tolerated    Vitals:   04/25/19 0457 04/25/19 0909  BP: 127/70 133/83  Pulse: (!) 108   Resp: 18   Temp: 97.8 F (36.6 C)   SpO2: 99%     History of present illness:  [name] [name] a 78 y.o.femalewith medical history significant foratrial fibrillation/H/o MVP, with severe MR 04/2012-s/pMV repair and Mazeprocedure,CAD-last cath 2017,systolicCHFLVEF 40% in 02/2018, chronic anemia, anxiety disorder, hyperlipidemia unable to tolerate Crestor, history of diverticulitis-last Colonoscopy in 2013, followed by [facility] last seen in 2019comes to ERfor evaluation of shortness of breath, abdominal pain and headache. Patient was seen on 12/23 at urgent care for abdominal pain CT showed 'showed possible mild diverticulitis, with no other acute pathology (an old L3 endplate compression fracture was noted, seen on prior imaging),placed on Augmentin and discharged home. She has been taking Augmentin but has been having upset stomach. In the ED, VSS except for HR in 110s.  Labs showed potassium 2.4, mag 1.3, calcium 6.3.   Chest x-ray with no acute cardiopulmonary disease, known right thyroid goiter.  Patient admitted for further management.     Today, patient reported feeling better, does have chronic neck and shoulder pain which is still ongoing, denies it worsening.  Patient able to tolerate her diet, denies any worsening nausea or vomiting, fever/chills, diarrhea.  Patient stable for DC to SNF, follow-up with PCP and cardiology recommended.  Hospital Course:  Principal Problem:   Hypokalemia Active Problems:   Diverticulitis   Hypertension   Anxiety   Atrial fibrillation (HCC)   Status post Maze operation for atrial fibrillation   S/P MVR (mitral valve repair)   Hypomagnesemia   Anemia   Patient is Jehovah's Witness   Protein-calorie malnutrition, severe  Acute diverticulitis Currently afebrile, with mild leukocytosis Repeat CT Abdomen and Pelvis showed no acute findings or evidence of complicated diverticulitis Last colonoscopy in 2013 by [facility] S/p Zosyn and has clinically improved. 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  Suspect her cardiomyopathy is nonischemic secondary to uncontrolled atrial fibrillation.  She will need outpatient cardiology evaluation Started low-dose ARB, continue Cardiology outpatient follow-up recommended  Paroxysmal atrial fibrillation with severe MR s/pMV repair/mazeprocedure HR not controlled, 100s EKG done showed sinus tachy, ??  Anxiety versus hyperthyroidism Continue sotalol, add low-dose metoprolol for now, consider transitioning to Toprol Continue xarelto Cardiology outpatient follow-up recommended  ??Dysphagia  Having trouble  swallowing pills Seen by speech therapy with recommendations for regular diet with thin liquids Pt only struggles with larger pills, may need to be crushed/apple sauce  Malnutrition BMI of 14.79 Nutrition consulted  Hyperthyroidism TSH 1.323 Free T4 0.78, will order 105 Continue methimazole  CAD Chest pain-free Normal cath in 2017  Anxiety Cont home xanax as needed  Patient is Jehovah's Witness  GOC, DNR poA          Malnutrition Type:  Nutrition Problem: Severe Malnutrition Etiology: chronic illness   Malnutrition Characteristics:  Signs/Symptoms: severe fat depletion, severe muscle depletion   Nutrition Interventions:  Interventions: Boost Breeze, Prostat, MVI   Estimated body mass index is 14.72 kg/m as calculated from the following:   Height as of this encounter: 5' 6.5" (1.689 m).   Weight as of this encounter: 42 kg.    Procedures:  None  Consultations:  None  Discharge Exam: BP 133/83 (BP Location: Right Arm)    Pulse (!) 108    Temp 97.8 F (36.6 C) (Oral)    Resp 18    Ht 5' 6.5" (1.689 m)    Wt 42 kg    SpO2 99%    BMI 14.72 kg/m   General: NAD Cardiovascular: S1, S2 present Respiratory: CTA B Abdomen: Soft, nontender, nondistended, bowel sounds present  Discharge Instructions You were cared for by a hospitalist during your hospital stay. If you have any questions about your discharge medications or the care you received while you were in the hospital after you are discharged, you can call the unit and asked to speak with the hospitalist on call if the hospitalist that took care of you is not available. Once you are discharged, your primary care physician will handle any further medical issues. Please note that NO REFILLS for any discharge medications will be authorized once you are discharged, as it is imperative that you return to your primary care physician (or establish a relationship with a primary care physician if you do  not have one) for your aftercare needs so that they can reassess your need for medications and monitor your lab values.  Discharge Instructions    Diet - low sodium heart healthy   Complete by: As directed    Increase activity slowly   Complete by: As directed      Allergies as of 04/25/2019      Reactions   Amiodarone Other (See Comments)   Hyperthyroidism   Diltiazem Other (See Comments)   Put into hospital for 3 days   Macrobid [nitrofurantoin Monohyd Macro] Nausea Only   Mexiletine    Confusion, pain in the face   Aspirin Rash   Tolerates low dose aspirin   Naproxen Sodium Swelling, Rash   Sulfa Drugs Cross Reactors Other (See Comments)   Allergy per mother - unknown reaction      Medication List    STOP taking these medications   traMADol 50 MG tablet Commonly known as: ULTRAM     TAKE these medications   ALPRAZolam 0.25 MG tablet Commonly known as: XANAX Take 0.5-1 tablets (0.125-0.25 mg total) by mouth 2 (two) times daily as needed for anxiety.   amoxicillin-clavulanate 875-125 MG tablet Commonly known as: AUGMENTIN Take 1 tablet by mouth every 12 (twelve) hours for 3 doses. What changed: when to take this   cholecalciferol 25 MCG (1000 UT) tablet Commonly known as: VITAMIN D Take 1,000 Units by mouth daily.   fluticasone 50 MCG/ACT nasal spray Commonly  known as: FLONASE Place 1 spray into both nostrils daily as needed for allergies.   HYDROcodone-acetaminophen 5-325 MG tablet Commonly known as: NORCO/VICODIN Take 1 tablet by mouth every 6 (six) hours as needed for up to 5 days for moderate pain or severe pain.   ICY HOT EX Apply 1 application topically daily as needed (back pain).   losartan 25 MG tablet Commonly known as: COZAAR Take 1 tablet (25 mg total) by mouth daily. Start taking on: April 26, 2019   methimazole 5 MG tablet Commonly known as: TAPAZOLE Take 5 mg by mouth every morning.   Metoprolol Tartrate 37.5 MG Tabs Take 37.5 mg  by mouth 2 (two) times daily.   multivitamin with minerals Tabs tablet Take 1 tablet by mouth daily. Start taking on: April 26, 2019   pantoprazole 40 MG tablet Commonly known as: PROTONIX Take 40 mg by mouth daily.   polyethylene glycol 17 g packet Commonly known as: MIRALAX / GLYCOLAX Take 17 g by mouth daily for 5 days. Start taking on: April 26, 2019   Rivaroxaban 15 MG Tabs tablet Commonly known as: Xarelto Take 1 tablet (15 mg total) by mouth daily with supper.   senna-docusate 8.6-50 MG tablet Commonly known as: Senokot-S Take 1 tablet by mouth 2 (two) times daily.   sotalol 80 MG tablet Commonly known as: Betapace Take 0.5 tablets (40 mg total) by mouth 2 (two) times daily.      Allergies  Allergen Reactions   Amiodarone Other (See Comments)    Hyperthyroidism    Diltiazem Other (See Comments)    Put into hospital for 3 days   Macrobid [Nitrofurantoin Monohyd Macro] Nausea Only   Mexiletine     Confusion, pain in the face   Aspirin Rash    Tolerates low dose aspirin   Naproxen Sodium Swelling and Rash   Sulfa Drugs Cross Reactors Other (See Comments)    Allergy per mother - unknown reaction    Contact information for follow-up providers    Burnard Bunting, MD. Schedule an appointment as soon as possible for a visit in 1 week(s).   Specialty: Internal Medicine Contact information: [place] [address] [phone]        Fay Records, MD .   Specialty: Cardiology Contact information: [address] [place] [address] [phone]        Constance Haw, MD .   Specialty: Cardiology Contact information: [place] [address] [phone]            Contact information for after-discharge care    [facility] .   Service: Skilled Nursing Contact information: [address] [place]  [place] [phone]                   The results of significant diagnostics from this hospitalization (including imaging, microbiology, ancillary and laboratory) are listed below for reference.    Significant Diagnostic Studies: CT ABDOMEN PELVIS WO CONTRAST  Result Date: 04/20/2019 CLINICAL DATA:  Shortness of breath with abdominal pain and headache since yesterday. Diverticulitis complications suspected. EXAM: CT ABDOMEN AND PELVIS WITHOUT CONTRAST TECHNIQUE: Multidetector CT imaging of the abdomen and pelvis was performed following the standard protocol without IV contrast. COMPARISON:  Abdominopelvic CT 04/18/2019 and 03/12/2019. FINDINGS: Lower chest: Slight enlargement of dependent right pleural effusion with increased right basilar atelectasis. Stable cardiomegaly and aortic atherosclerosis. Hepatobiliary: The  liver appears unremarkable as imaged in the noncontrast state. No evidence of gallstones, gallbladder wall thickening or biliary dilatation. Pancreas: Unremarkable. No pancreatic ductal dilatation or surrounding inflammatory changes. Spleen: Normal in size without focal abnormality. Adrenals/Urinary Tract: Both adrenal glands appear normal. No focal renal abnormalities are identified on noncontrast imaging. There are bilateral extrarenal pelves. No evidence of urinary tract calculus or hydronephrosis. New at least moderate bladder distention without wall thickening or surrounding inflammation. Stomach/Bowel: The stomach, small bowel and proximal colon appear stable without significant findings. There are diverticular changes throughout the descending and sigmoid colon. There is mild sigmoid colon wall thickening, but no extraluminal fluid collection. Enteric contrast was administered although has not yet passed through the sigmoid colon. No evidence of bowel obstruction. Vascular/Lymphatic: There are no enlarged abdominal or pelvic lymph nodes. Aortic and branch vessel  atherosclerosis. Reproductive: The uterus and ovaries appear normal. No adnexal mass. Other: No appreciable ascites, focal extraluminal fluid or air collections. Musculoskeletal: No acute or significant osseous findings. Stable superior endplate compression deformity at L3 and mild lumbar spondylosis. IMPRESSION: 1. Compared with the recent CT, no acute findings or evidence of complicated diverticulitis. Mild sigmoid colon wall thickening appears unchanged. No evidence of abscess or bowel obstruction. 2. New bladder distention without wall thickening, surrounding distension or hydronephrosis. 3. Minimally increased right pleural effusion and associated right basilar atelectasis. 4.  Aortic Atherosclerosis (ICD10-I70.0). Electronically Signed   By: Carey BullocksWilliam  Veazey M.D.   On: 04/20/2019 19:17   DG Chest 1 View  Result Date: 04/20/2019 CLINICAL DATA:  Shortness of breath. EXAM: CHEST  1 VIEW COMPARISON:  11/20/2018 and chest CT 12/05/2017 FINDINGS: Lungs are adequately inflated without focal airspace consolidation or effusion. Emphysematous disease is present. Evidence of patient's known moderate size right thyroid goiter. Cardiomediastinal silhouette and remainder of the exam is unchanged. IMPRESSION: 1.  No acute cardiopulmonary disease. 2.  COPD. 3.  Known right thyroid goiter. Electronically Signed   By: Elberta Fortisaniel  Boyle M.D.   On: 04/20/2019 08:49   CT Abdomen Pelvis W Contrast  Result Date: 04/18/2019 CLINICAL DATA:  Constipation for 2 days with abdominal pain. Left lower quadrant abdominal pain. EXAM: CT ABDOMEN AND PELVIS WITH CONTRAST TECHNIQUE: Multidetector CT imaging of the abdomen and pelvis was performed using the standard protocol following bolus administration of intravenous contrast. CONTRAST:  100mL OMNIPAQUE IOHEXOL 300 MG/ML  SOLN COMPARISON:  03/12/2019 FINDINGS: Lower chest: Minimal increase in the small right pleural effusion. Mild prominence of the visualized portion of the right atrium.  Descending thoracic aortic atherosclerotic calcification. Hepatobiliary: Unremarkable Pancreas: Unremarkable Spleen: Unremarkable Adrenals/Urinary Tract: Stable 0.9 cm cyst of the left kidney lower pole. Small hypodense lesion favoring cyst of the left mid kidney posteromedially, technically too small to characterize. Adrenal glands normal. Stomach/Bowel: Sigmoid diverticulosis is observed with associated wall thickening and a small amount of adjacent ascites, it is difficult to exclude mild acute diverticulitis given this appearance. No abscess or extraluminal gas is identified. There is some formed stool in the distal colon. Scattered air-levels in nondilated loops of small bowel. The appendix is not well seen. Vascular/Lymphatic: Aortoiliac atherosclerotic vascular disease. Reproductive: Unremarkable Other: Small amount of pelvic ascites primarily adjacent to the proximal sigmoid colon. Musculoskeletal: Superior endplate compression at L3. Grade 1 degenerative anterolisthesis at L4-5. Mild dextroconvex lumbar scoliosis. Moderate degenerative chondral thinning in the hips. IMPRESSION: 1. Sigmoid diverticulosis with wall thickening and a small amount of adjacent ascites, suspicious for mild acute diverticulitis. No abscess or extraluminal gas identified.  2. Minimal increase in the small right pleural effusion. 3. Other imaging findings of potential clinical significance: Mild prominence of the visualized portion of the right atrium. Moderate degenerative chondral thinning in the hips. Chronic superior endplate compression at L3. Aortic Atherosclerosis (ICD10-I70.0). Electronically Signed   By: Gaylyn RongWalter  Liebkemann M.D.   On: 04/18/2019 12:25   DG CHEST PORT 1 VIEW  Result Date: 04/22/2019 CLINICAL DATA:  Shortness of breath today. EXAM: PORTABLE CHEST 1 VIEW COMPARISON:  04/20/2019 and chest CT 07/25/2015 FINDINGS: Patient is slightly rotated to the right. Lungs are hyperexpanded without focal airspace  consolidation, effusion or pneumothorax. Opacification over the upper right paramediastinal region with deviation of the trachea to the left compatible with known right thyroid goiter. Mild stable cardiomegaly. Remainder of the exam is unchanged. IMPRESSION: Hyperexpansion without acute cardiopulmonary disease. Mild stable cardiomegaly. Right thyroid goiter. Electronically Signed   By: Elberta Fortisaniel  Boyle M.D.   On: 04/22/2019 04:53   ECHOCARDIOGRAM COMPLETE  Result Date: 04/21/2019   ECHOCARDIOGRAM REPORT   Patient Name:   [name] [name] Date of Exam: 04/21/2019 Medical Rec #:  [id number]         Height:       66.5 in Accession #:    [id number]        Weight:       93.0 lb Date of Birth:  09-21-1940         BSA:          1.45 m Patient Age:    78 years          BP:           129/74 mmHg Patient Gender: F                 HR:           115 bpm. Exam Location:  Inpatient Procedure: 2D Echo, Color Doppler and Cardiac Doppler Indications:    Dyspnea 786.09  History:        Patient has prior history of Echocardiogram examinations, most                 recent 07/11/2015. Mitral Valve Disease.  Sonographer:    Roosvelt Maserachel Lane RDCS Referring Phys: [id number] [facility] LATIF [facility] Center[facility]IKH IMPRESSIONS  1. Left ventricular ejection fraction, by visual estimation, is 30 to 35%. The left ventricle has severely decreased function. There is no left ventricular hypertrophy.  2. Left ventricular diastolic parameters are consistent with Grade II diastolic dysfunction (pseudonormalization).  3. The left ventricle demonstrates global hypokinesis.  4. Global right ventricle has normal systolic function.The right ventricular size is normal. No increase in right ventricular wall thickness.  5. Left atrial size was normal.  6. Right atrial size was severely dilated.  7. The mitral valve is normal in structure. No evidence of mitral valve regurgitation. No evidence of mitral stenosis.  8. MV repair, mean gradient 3mmHg.  9. The tricuspid valve is normal in  structure. 10. Aortic valve regurgitation is moderate. 11. The aortic valve is tricuspid. Aortic valve regurgitation is moderate. No evidence of aortic valve sclerosis or stenosis. 12. The pulmonic valve was normal in structure. Pulmonic valve regurgitation is not visualized. 13. The inferior vena cava is normal in size with greater than 50% respiratory variability, suggesting right atrial pressure of 3 mmHg. FINDINGS  Left Ventricle: Left ventricular ejection fraction, by visual estimation, is 30 to 35%. The left ventricle has severely decreased function. The left ventricle demonstrates global hypokinesis. There is  no left ventricular hypertrophy. Left ventricular diastolic parameters are consistent with Grade II diastolic dysfunction (pseudonormalization). Normal left atrial pressure. Right Ventricle: The right ventricular size is normal. No increase in right ventricular wall thickness. Global RV systolic function is has normal systolic function. Left Atrium: Left atrial size was normal in size. Right Atrium: Right atrial size was severely dilated Pericardium: There is no evidence of pericardial effusion. Mitral Valve: The mitral valve is normal in structure. No evidence of mitral valve regurgitation. No evidence of mitral valve stenosis by observation. MV peak gradient, 7.0 mmHg. MV repair, mean gradient . Tricuspid Valve: The tricuspid valve is normal in structure. Tricuspid valve regurgitation is trivial. Aortic Valve: The aortic valve is tricuspid. . There is moderate thickening and moderate calcification of the aortic valve. Aortic valve regurgitation is moderate. The aortic valve is structurally normal, with no evidence of sclerosis or stenosis. There is moderate thickening of the aortic valve. There is moderate calcification of the aortic valve. Pulmonic Valve: The pulmonic valve was normal in structure. Pulmonic valve regurgitation is not visualized. Pulmonic regurgitation is not visualized. Aorta:  The aortic root, ascending aorta and aortic arch are all structurally normal, with no evidence of dilitation or obstruction. Venous: The inferior vena cava is normal in size with greater than 50% respiratory variability, suggesting right atrial pressure of 3 mmHg. IAS/Shunts: No atrial level shunt detected by color flow Doppler. There is no evidence of a patent foramen ovale. No ventricular septal defect is seen or detected. There is no evidence of an atrial septal defect.  LEFT VENTRICLE PLAX 2D LVIDd:         5.00 cm LVIDs:         4.10 cm LV PW:         1.00 cm LV IVS:        1.00 cm LVOT diam:     1.60 cm LV SV:         44 ml LV SV Index:   31.74 LVOT Area:     2.01 cm  LV Volumes (MOD) LV area d, A4C:    36.60 cm LV area s, A4C:    31.10 cm LV major d, A4C:   8.34 cm LV major s, A4C:   8.23 cm LV vol d, MOD A4C: 132.0 ml LV vol s, MOD A4C: 104.0 ml LV SV MOD A4C:     132.0 ml RIGHT VENTRICLE          IVC RV Basal diam:  3.30 cm  IVC diam: 1.80 cm TAPSE (M-mode): 1.5 cm LEFT ATRIUM         Index      RIGHT ATRIUM           Index LA diam:    3.80 cm 2.62 cm/m RA Area:     21.50 cm                                RA Volume:   65.80 ml  45.30 ml/m  AORTIC VALVE LVOT Vmax:   63.30 cm/s LVOT Vmean:  39.800 cm/s LVOT VTI:    0.083 m  AORTA Ao Root diam: 3.00 cm Ao Asc diam:  3.00 cm MITRAL VALVE MV Peak grad: 7.0 mmHg  SHUNTS MV Mean grad: 3.0 mmHg  Systemic VTI:  0.08 m MV Vmax:      1.32 m/s  Systemic Diam: 1.60 cm MV Vmean:  88.6 cm/s MV VTI:       0.25 m  Donato Schultz MD Electronically signed by Donato Schultz MD Signature Date/Time: 04/21/2019/2:30:12 PM    Final     Microbiology: Recent Results (from the past 240 hour(s))  SARS CORONAVIRUS 2 (TAT 6-24 HRS) Nasopharyngeal Nasopharyngeal Swab     Status: None   Collection Time: 04/20/19  1:26 PM   Specimen: Nasopharyngeal Swab  Result Value Ref Range Status   SARS Coronavirus 2 NEGATIVE NEGATIVE Final    Comment: (NOTE) SARS-CoV-2 target nucleic  acids are NOT DETECTED. The SARS-CoV-2 RNA is generally detectable in upper and lower respiratory specimens during the acute phase of infection. Negative results do not preclude SARS-CoV-2 infection, do not rule out co-infections with other pathogens, and should not be used as the sole basis for treatment or other patient management decisions. Negative results must be combined with clinical observations, patient history, and epidemiological information. The expected result is Negative. Fact Sheet for Patients: HairSlick.no Fact Sheet for Healthcare Providers: quierodirigir.com This test is not yet approved or cleared by the [place] FDA and  has been authorized for detection and/or diagnosis of SARS-CoV-2 by FDA under an Emergency Use Authorization (EUA). This EUA will remain  in effect (meaning this test can be used) for the duration of the COVID-19 declaration under Section 56 4(b)(1) of the Act, 21 U.S.C. section 360bbb-3(b)(1), unless the authorization is terminated or revoked sooner. Performed at [facility], [address], [place], [place] [address]   SARS CORONAVIRUS 2 (TAT 6-24 HRS) Nasopharyngeal Nasopharyngeal Swab     Status: None   Collection Time: 04/24/19  2:50 PM   Specimen: Nasopharyngeal Swab  Result Value Ref Range Status   SARS Coronavirus 2 NEGATIVE NEGATIVE Final    Comment: (NOTE) SARS-CoV-2 target nucleic acids are NOT DETECTED. The SARS-CoV-2 RNA is generally detectable in upper and lower respiratory specimens during the acute phase of infection. Negative results do not preclude SARS-CoV-2 infection, do not rule out co-infections with other pathogens, and should not be used as the sole basis for treatment or other patient management decisions. Negative results must be combined with clinical observations, patient history, and epidemiological information. The expected result is Negative. Fact  Sheet for Patients: HairSlick.no Fact Sheet for Healthcare Providers: quierodirigir.com This test is not yet approved or cleared by the [place] FDA and  has been authorized for detection and/or diagnosis of SARS-CoV-2 by FDA under an Emergency Use Authorization (EUA). This EUA will remain  in effect (meaning this test can be used) for the duration of the COVID-19 declaration under Section 56 4(b)(1) of the Act, 21 U.S.C. section 360bbb-3(b)(1), unless the authorization is terminated or revoked sooner. Performed at [facility], [address], [place], [place] [address]      Labs: Basic Metabolic Panel: Recent Labs  Lab 04/20/19 [phone] 04/20/19 1326 04/21/19 0509 04/21/19 0838 04/22/19 0524 04/23/19 0432 04/24/19 0534  NA 139  --  135  --  135 133* 133*  K 2.4*  --  3.8  --  3.9 3.7 3.6  CL 115*  --  103  --  93* 97* 97*  CO2 20*  --  23  --  GLUCOSE 79  --  97  --  98 106* 135*  BUN 7*  --  9  --  CREATININE 0.43*  --  0.50  --  0.68 0.60 0.69  CALCIUM 6.3*  8.9 9.4  --  9.4 9.9 9.9  MG 1.3*  --   --  2.3 2.0  --   --   PHOS  --   --   --  3.0 2.6  --   --    Liver Function Tests: Recent Labs  Lab 04/20/19 0835 04/21/19 0509 04/22/19 0524  AST ALT ALKPHOS 52 66 62  BILITOT 0.7 1.8* 1.2  PROT 4.6* 7.1 7.0  ALBUMIN 2.5* 3.9 3.9   No results for input(s): LIPASE, AMYLASE in the last 168 hours. No results for input(s): AMMONIA in the last 168 hours. CBC: Recent Labs  Lab 04/20/19 0835 04/21/19 0509 04/21/19 1107 04/21/19 1918 04/22/19 0524 04/23/19 0432 04/24/19 0534  WBC 6.1 9.9  --   --  8.6 8.8 11.0*  NEUTROABS 4.9  --   --   --  5.0 5.0 8.2*  HGB 8.9* 13.0 12.7 12.3 13.7 13.7 14.9  HCT 28.3* 40.2 38.5 37.6 42.6 42.0 45.5  MCV 94.3 93.1  --   --  91.4 91.3 89.7  PLT 145* 207  --   --  220 228 278   Cardiac Enzymes: No results for input(s): CKTOTAL,  CKMB, CKMBINDEX, TROPONINI in the last 168 hours. BNP: BNP (last 3 results) Recent Labs    05/09/18 1042 04/20/19 0835  BNP 141.0* 224.6*    ProBNP (last 3 results) No results for input(s): PROBNP in the last 8760 hours.  CBG: No results for input(s): GLUCAP in the last 168 hours.     Signed:  Briant Cedar, MD [facility] 04/25/2019, 11:48 AM

## 2019-04-25 NOTE — Progress Notes (Signed)
Pt discharged today per Dr. Horris Latino to [facility] SN[facility]. Pt's IV site d/c'd and WDL. Pt's VSS. Report called to [facility], receiving nurse. Verbalized understanding. PTAR scheduled for 3:30 pm pickup time. Pt in stable condition for discharge.

## 2019-04-25 NOTE — Progress Notes (Addendum)
Dr. Posey Pronto made aware of Mews of 2  again when notified for Zofran for patient. No orders obtained.

## 2019-04-25 NOTE — Progress Notes (Signed)
Pt stable though anxiety remains. Pt medicated for nausea. Pt stated she was sick to her stomach after RN attempted to give her pain meds that she requested. She then stated she could not take them due to her stomach being upset. Md paged and Zofran ordered. Pt heart rate remains variable at 100-115. Mds have been aware of her MEWS score throughout the day. RN will continue to monitor and medicate for needs.

## 2019-04-25 NOTE — Plan of Care (Signed)
  Problem: Education: Goal: Knowledge of General Education information will improve Description: Including pain rating scale, medication(s)/side effects and non-pharmacologic comfort measures Outcome: Completed/Met   Problem: Health Behavior/Discharge Planning: Goal: Ability to manage health-related needs will improve 04/25/2019 1149 by Annie Sable, RN Outcome: Completed/Met 04/25/2019 1047 by Annie Sable, RN Outcome: Progressing   Problem: Clinical Measurements: Goal: Ability to maintain clinical measurements within normal limits will improve 04/25/2019 1149 by Annie Sable, RN Outcome: Completed/Met 04/25/2019 1047 by Annie Sable, RN Outcome: Progressing Goal: Will remain free from infection Outcome: Completed/Met Goal: Diagnostic test results will improve 04/25/2019 1149 by Annie Sable, RN Outcome: Completed/Met 04/25/2019 1047 by Annie Sable, RN Outcome: Progressing Goal: Respiratory complications will improve Outcome: Completed/Met Goal: Cardiovascular complication will be avoided 04/25/2019 1149 by Annie Sable, RN Outcome: Completed/Met 04/25/2019 1047 by Annie Sable, RN Outcome: Progressing   Problem: Activity: Goal: Risk for activity intolerance will decrease 04/25/2019 1149 by Annie Sable, RN Outcome: Completed/Met 04/25/2019 1047 by Annie Sable, RN Outcome: Adequate for Discharge   Problem: Nutrition: Goal: Adequate nutrition will be maintained 04/25/2019 1149 by Annie Sable, RN Outcome: Completed/Met 04/25/2019 1047 by Annie Sable, RN Outcome: Adequate for Discharge   Problem: Coping: Goal: Level of anxiety will decrease 04/25/2019 1149 by Annie Sable, RN Outcome: Completed/Met 04/25/2019 1047 by Annie Sable, RN Outcome: Progressing   Problem: Elimination: Goal: Will not experience complications related to bowel motility 04/25/2019 1149 by Annie Sable, RN Outcome: Completed/Met 04/25/2019  1047 by Annie Sable, RN Outcome: Adequate for Discharge Goal: Will not experience complications related to urinary retention Outcome: Completed/Met   Problem: Pain Managment: Goal: General experience of comfort will improve 04/25/2019 1149 by Annie Sable, RN Outcome: Completed/Met 04/25/2019 1047 by Annie Sable, RN Outcome: Adequate for Discharge   Problem: Safety: Goal: Ability to remain free from injury will improve 04/25/2019 1149 by Annie Sable, RN Outcome: Completed/Met 04/25/2019 1047 by Annie Sable, RN Outcome: Adequate for Discharge   Problem: Skin Integrity: Goal: Risk for impaired skin integrity will decrease 04/25/2019 1149 by Annie Sable, RN Outcome: Completed/Met 04/25/2019 1047 by Annie Sable, RN Outcome: Adequate for Discharge

## 2019-04-25 NOTE — TOC Transition Note (Addendum)
Transition of Care [facility]) - CM/SW Discharge Note   Patient Details  Name: [name] [name] MRN: [id number] Date of Birth: Sep 07, 1940  Transition of Care [facility] CM/SW Contact:  Wende Neighbors, LCSW Phone Number: 04/25/2019, 12:45 PM   Clinical Narrative:  Patient to discharge to [facility] via [place]. RN to please call [phone] ([id number]) for report. Patients son is aware of discharge and is agreeable      Final next level of care: Skilled Nursing Facility Barriers to Discharge: No Barriers Identified   Patient Goals and CMS Choice Patient states their goals for this hospitalization and ongoing recovery are:: to be home CMS Medicare.gov Compare Post Acute Care list provided to:: Patient Choice offered to / list presented to : Patient  Discharge Placement              Patient chooses bed at: [facility] Patient to be transferred to facility by: ptar Name of family member notified: spoke to son Patient and family notified of of transfer: 04/25/19  Discharge Plan and Services In-house Referral: Clinical Social Work   Post Acute Care Choice: [place]                               Social Determinants of Health ([place]) Interventions     Readmission Risk Interventions No flowsheet data found.

## 2019-04-25 NOTE — Progress Notes (Signed)
Occupational Therapy Treatment Patient Details Name: [name] [name] MRN: [id number] DOB: [birth year] Today's Date: 04/25/2019    History of present illness 78 y.o. female with medical history significant for atrial fibrillation/ H/o MVP, with severe MR 04/2012-s/p MV repair, CAD- systolic CHF, chronic anemia, anxiety disorder, hyperlipidemia unable to tolerate Crestor history of diverticulitis-last Colonoscopy in 2013, followed by [facility] last seen in 2019 comes to ER for evaluation of shortness of breath abdominal pain and headache.  Patient was seen on 12/23 at urgent care for abdominal pain CT showed 'showed possible mild diverticulitis, DC'd home. H/O an old L3 endplate compression fracture.  Complains of headache , Sob and DOE ,abdominal pain.Repeat echo showed slightly worsening EF and grade 2 diastolic dysfunction.   OT comments  Pt making progress with functional goals. Pt sat EOB with min A . Pt participated in simple grooming and simulated LB ADL tasks. Pt transferred to [facility] min A , SPT with cues for safety, sequencing and hand placement. OT will continue to follow acutely  Follow Up Recommendations  SNF    Equipment Recommendations  Other (comment)(TBD at next venue of care)    Recommendations for Other Services      Precautions / Restrictions Precautions Precautions: Fall Precaution Comments: back pain Restrictions Weight Bearing Restrictions: No       Mobility Bed Mobility Overal bed mobility: Needs Assistance Bed Mobility: Supine to Sit;Sit to Supine     Supine to sit: Min assist;HOB elevated Sit to supine: Min assist   General bed mobility comments: Increased time, min A with LEs to EOB/back onto bed and with trunk  Transfers Overall transfer level: Needs assistance Equipment used: Rolling walker (2 wheeled) Transfers: Sit to/from Stand Sit to Stand: Min assist;From elevated surface         General transfer comment: VCs safety, hand placement. Poor  balance    Balance Overall balance assessment: Needs assistance Sitting-balance support: Feet supported;Bilateral upper extremity supported Sitting balance-Leahy Scale: Fair     Standing balance support: Bilateral upper extremity supported Standing balance-Leahy Scale: Poor                             ADL either performed or assessed with clinical judgement   ADL Overall ADL's : Needs assistance/impaired     Grooming: Wash/dry hands;Wash/dry face;Set up;Supervision/safety;Sitting               Lower Body Dressing: Moderate assistance;Sit to/from stand;Cueing for compensatory techniques;Cueing for safety;Cueing for sequencing   Toilet Transfer: Minimal assistance;RW;BSC;Stand-pivot;Cueing for safety;Cueing for sequencing   Toileting- Clothing Manipulation and Hygiene: Moderate assistance;Cueing for safety;Cueing for sequencing;Sit to/from stand       Functional mobility during ADLs: Minimal assistance;Rolling walker;Cueing for safety       Vision Patient Visual Report: No change from baseline     Perception     Praxis      Cognition Arousal/Alertness: Awake/alert Behavior During Therapy: WFL for tasks assessed/performed Overall Cognitive Status: Within Functional Limits for tasks assessed                                          Exercises     Shoulder Instructions       General Comments      Pertinent Vitals/ Pain       Pain Assessment: No/denies pain Faces Pain  Scale: Hurts little more Pain Location: back, lower abd Pain Descriptors / Indicators: Discomfort;Sore;Grimacing;Guarding Pain Intervention(s): Limited activity within patient's tolerance;Monitored during session;Repositioned  Home Living                                          Prior Functioning/Environment              Frequency  Min 2X/week        Progress Toward Goals  OT Goals(current goals can now be found in the care plan  section)  Progress towards OT goals: Progressing toward goals  Acute Rehab OT Goals Patient Stated Goal: to go home, feel better OT Goal Formulation: With patient  Plan Discharge plan remains appropriate    Co-evaluation                 AM-PAC OT "6 Clicks" Daily Activity     Outcome Measure   Help from another person eating meals?: None Help from another person taking care of personal grooming?: A Little Help from another person toileting, which includes using toliet, bedpan, or urinal?: A Lot Help from another person bathing (including washing, rinsing, drying)?: A Little Help from another person to put on and taking off regular upper body clothing?: A Little Help from another person to put on and taking off regular lower body clothing?: A Lot 6 Click Score: 17    End of Session Equipment Utilized During Treatment: Rolling walker;Gait belt;Other (comment)(BSC)  OT Visit Diagnosis: Unsteadiness on feet (R26.81);Muscle weakness (generalized) (M62.81);Other abnormalities of gait and mobility (R26.89);History of falling (Z91.81)   Activity Tolerance Patient tolerated treatment well   Patient Left in bed;with call bell/phone within reach;with bed alarm set   Nurse Communication          Time: 2458-0998 OT Time Calculation (min): 17 min  Charges: OT General Charges $OT Visit: 1 Visit OT Treatments $Self Care/Home Management : 8-22 mins     Britt Bottom 04/25/2019, 2:35 PM

## 2019-04-25 NOTE — Plan of Care (Signed)
  Problem: Clinical Measurements: Goal: Respiratory complications will improve Outcome: Progressing   Problem: Clinical Measurements: Goal: Cardiovascular complication will be avoided Outcome: Progressing   Problem: Pain Managment: Goal: General experience of comfort will improve Outcome: Progressing   Problem: Skin Integrity: Goal: Risk for impaired skin integrity will decrease Outcome: Progressing   

## 2019-04-25 NOTE — Plan of Care (Signed)
  Problem: Education: Goal: Knowledge of General Education information will improve Description: Including pain rating scale, medication(s)/side effects and non-pharmacologic comfort measures Outcome: Completed/Met   Problem: Health Behavior/Discharge Planning: Goal: Ability to manage health-related needs will improve Outcome: Progressing   Problem: Clinical Measurements: Goal: Ability to maintain clinical measurements within normal limits will improve Outcome: Progressing Goal: Will remain free from infection Outcome: Completed/Met Goal: Diagnostic test results will improve Outcome: Progressing Goal: Respiratory complications will improve Outcome: Completed/Met Goal: Cardiovascular complication will be avoided Outcome: Progressing   Problem: Activity: Goal: Risk for activity intolerance will decrease Outcome: Adequate for Discharge   Problem: Nutrition: Goal: Adequate nutrition will be maintained Outcome: Adequate for Discharge   Problem: Coping: Goal: Level of anxiety will decrease Outcome: Progressing   Problem: Elimination: Goal: Will not experience complications related to bowel motility Outcome: Adequate for Discharge Goal: Will not experience complications related to urinary retention Outcome: Completed/Met   Problem: Pain Managment: Goal: General experience of comfort will improve Outcome: Adequate for Discharge   Problem: Safety: Goal: Ability to remain free from injury will improve Outcome: Adequate for Discharge   Problem: Skin Integrity: Goal: Risk for impaired skin integrity will decrease Outcome: Adequate for Discharge

## 2019-04-26 DIAGNOSIS — K5792 Diverticulitis of intestine, part unspecified, without perforation or abscess without bleeding: Secondary | ICD-10-CM | POA: Diagnosis not present

## 2019-04-26 DIAGNOSIS — I5022 Chronic systolic (congestive) heart failure: Secondary | ICD-10-CM | POA: Diagnosis not present

## 2019-04-26 DIAGNOSIS — I480 Paroxysmal atrial fibrillation: Secondary | ICD-10-CM | POA: Diagnosis not present

## 2019-04-26 DIAGNOSIS — I10 Essential (primary) hypertension: Secondary | ICD-10-CM | POA: Diagnosis not present

## 2019-05-01 DIAGNOSIS — K5792 Diverticulitis of intestine, part unspecified, without perforation or abscess without bleeding: Secondary | ICD-10-CM | POA: Diagnosis not present

## 2019-05-01 DIAGNOSIS — F411 Generalized anxiety disorder: Secondary | ICD-10-CM | POA: Diagnosis not present

## 2019-05-01 DIAGNOSIS — R141 Gas pain: Secondary | ICD-10-CM | POA: Diagnosis not present

## 2019-05-01 DIAGNOSIS — E43 Unspecified severe protein-calorie malnutrition: Secondary | ICD-10-CM | POA: Diagnosis not present

## 2019-05-03 DIAGNOSIS — M6281 Muscle weakness (generalized): Secondary | ICD-10-CM | POA: Diagnosis not present

## 2019-05-03 DIAGNOSIS — F411 Generalized anxiety disorder: Secondary | ICD-10-CM | POA: Diagnosis not present

## 2019-05-03 DIAGNOSIS — K5792 Diverticulitis of intestine, part unspecified, without perforation or abscess without bleeding: Secondary | ICD-10-CM | POA: Diagnosis not present

## 2019-05-03 DIAGNOSIS — R1084 Generalized abdominal pain: Secondary | ICD-10-CM | POA: Diagnosis not present

## 2019-05-07 ENCOUNTER — Ambulatory Visit (HOSPITAL_COMMUNITY): Payer: PPO | Admitting: Physician Assistant

## 2019-05-07 DIAGNOSIS — E43 Unspecified severe protein-calorie malnutrition: Secondary | ICD-10-CM | POA: Diagnosis not present

## 2019-05-07 DIAGNOSIS — M6281 Muscle weakness (generalized): Secondary | ICD-10-CM | POA: Diagnosis not present

## 2019-05-07 DIAGNOSIS — F411 Generalized anxiety disorder: Secondary | ICD-10-CM | POA: Diagnosis not present

## 2019-05-07 DIAGNOSIS — K5792 Diverticulitis of intestine, part unspecified, without perforation or abscess without bleeding: Secondary | ICD-10-CM | POA: Diagnosis not present

## 2019-05-08 DIAGNOSIS — K5792 Diverticulitis of intestine, part unspecified, without perforation or abscess without bleeding: Secondary | ICD-10-CM | POA: Diagnosis not present

## 2019-05-08 DIAGNOSIS — D649 Anemia, unspecified: Secondary | ICD-10-CM | POA: Diagnosis not present

## 2019-05-08 DIAGNOSIS — I5043 Acute on chronic combined systolic (congestive) and diastolic (congestive) heart failure: Secondary | ICD-10-CM | POA: Diagnosis not present

## 2019-05-08 DIAGNOSIS — I480 Paroxysmal atrial fibrillation: Secondary | ICD-10-CM | POA: Diagnosis not present

## 2019-05-23 DIAGNOSIS — K219 Gastro-esophageal reflux disease without esophagitis: Secondary | ICD-10-CM | POA: Diagnosis not present

## 2019-05-23 DIAGNOSIS — I110 Hypertensive heart disease with heart failure: Secondary | ICD-10-CM | POA: Diagnosis not present

## 2019-05-23 DIAGNOSIS — E0590 Thyrotoxicosis, unspecified without thyrotoxic crisis or storm: Secondary | ICD-10-CM | POA: Diagnosis not present

## 2019-05-23 DIAGNOSIS — K5792 Diverticulitis of intestine, part unspecified, without perforation or abscess without bleeding: Secondary | ICD-10-CM | POA: Diagnosis not present

## 2019-05-23 DIAGNOSIS — I5020 Unspecified systolic (congestive) heart failure: Secondary | ICD-10-CM | POA: Diagnosis not present

## 2019-05-23 DIAGNOSIS — E46 Unspecified protein-calorie malnutrition: Secondary | ICD-10-CM | POA: Diagnosis not present

## 2019-05-23 DIAGNOSIS — I480 Paroxysmal atrial fibrillation: Secondary | ICD-10-CM | POA: Diagnosis not present

## 2019-05-24 ENCOUNTER — Telehealth: Payer: Self-pay | Admitting: Cardiology

## 2019-05-24 NOTE — Telephone Encounter (Signed)
   The patient called Dr. Lanell Matar office and stated she was feeling fatigued, so she was seen by the NP. The patient was recently in the hospital and was in Afib. Labs were drawn but no EKG was done. She did have a chest x-ray done which was within normal limits.  [place], NP at Dr. Lanell Matar office started the patient on Metoprolol succinate 25 mg daily yesterday 05-22-18. Dr. [name] and the NP think that she probably wont take the medicine, but they wanted to make Dr. Elberta Fortis aware before her next visit. Do not hesitate to reach out to [facility] if anything needs discussed about this patient

## 2019-05-25 NOTE — Telephone Encounter (Signed)
Follow up     Pt is calling to speak with Sherri about her Metoprolol     Please advise

## 2019-05-25 NOTE — Telephone Encounter (Signed)
LMTCB

## 2019-05-25 NOTE — Telephone Encounter (Signed)
I spoke to [place], NP from Dr Lanell Matar office who said that the patient is to be on Metoprolol Tartrate 37.5 mg bid.  It has been clarified.

## 2019-05-25 NOTE — Telephone Encounter (Signed)
Left Dr Lanell Matar office a message regarding patient's Metoprolol.  1/29

## 2019-05-30 DIAGNOSIS — Z7951 Long term (current) use of inhaled steroids: Secondary | ICD-10-CM | POA: Diagnosis not present

## 2019-05-30 DIAGNOSIS — Z87440 Personal history of urinary (tract) infections: Secondary | ICD-10-CM | POA: Diagnosis not present

## 2019-05-30 DIAGNOSIS — E0590 Thyrotoxicosis, unspecified without thyrotoxic crisis or storm: Secondary | ICD-10-CM | POA: Diagnosis not present

## 2019-05-30 DIAGNOSIS — F419 Anxiety disorder, unspecified: Secondary | ICD-10-CM | POA: Diagnosis not present

## 2019-05-30 DIAGNOSIS — E46 Unspecified protein-calorie malnutrition: Secondary | ICD-10-CM | POA: Diagnosis not present

## 2019-05-30 DIAGNOSIS — K219 Gastro-esophageal reflux disease without esophagitis: Secondary | ICD-10-CM | POA: Diagnosis not present

## 2019-05-30 DIAGNOSIS — I5020 Unspecified systolic (congestive) heart failure: Secondary | ICD-10-CM | POA: Diagnosis not present

## 2019-05-30 DIAGNOSIS — Z7901 Long term (current) use of anticoagulants: Secondary | ICD-10-CM | POA: Diagnosis not present

## 2019-05-30 DIAGNOSIS — E785 Hyperlipidemia, unspecified: Secondary | ICD-10-CM | POA: Diagnosis not present

## 2019-05-30 DIAGNOSIS — Z952 Presence of prosthetic heart valve: Secondary | ICD-10-CM | POA: Diagnosis not present

## 2019-05-30 DIAGNOSIS — K5790 Diverticulosis of intestine, part unspecified, without perforation or abscess without bleeding: Secondary | ICD-10-CM | POA: Diagnosis not present

## 2019-05-30 DIAGNOSIS — I110 Hypertensive heart disease with heart failure: Secondary | ICD-10-CM | POA: Diagnosis not present

## 2019-05-30 DIAGNOSIS — I480 Paroxysmal atrial fibrillation: Secondary | ICD-10-CM | POA: Diagnosis not present

## 2019-05-30 DIAGNOSIS — Z9181 History of falling: Secondary | ICD-10-CM | POA: Diagnosis not present

## 2019-06-05 ENCOUNTER — Other Ambulatory Visit: Payer: Self-pay

## 2019-06-05 ENCOUNTER — Telehealth (INDEPENDENT_AMBULATORY_CARE_PROVIDER_SITE_OTHER): Payer: PPO | Admitting: Cardiology

## 2019-06-05 VITALS — BP 123/80 | HR 111

## 2019-06-05 DIAGNOSIS — I493 Ventricular premature depolarization: Secondary | ICD-10-CM

## 2019-06-05 DIAGNOSIS — I4819 Other persistent atrial fibrillation: Secondary | ICD-10-CM | POA: Diagnosis not present

## 2019-06-05 DIAGNOSIS — R002 Palpitations: Secondary | ICD-10-CM

## 2019-06-05 NOTE — Progress Notes (Signed)
Electrophysiology TeleHealth Note   Due to national recommendations of social distancing due to COVID 19, an audio/video telehealth visit is felt to be most appropriate for this patient at this time.  See Epic message for the patient's consent to telehealth for [facility].   Date:  06/05/2019   ID:  [name] [name], DOB 13-Sep-1940, MRN [id number]  Location: patient's home  Provider location: [address], [place]  Evaluation Performed: Follow-up visit  PCP:  Burnard Bunting, MD  Cardiologist:  Dorris Carnes, MD  Electrophysiologist:  Dr Curt Bears  Chief Complaint:  fatigue  History of Present Illness:    [name] [name] is a 79 y.o. female who presents via audio/video conferencing for a telehealth visit today.  Since last being seen in our clinic, the patient reports doing very well.  Today, she denies symptoms of palpitations, chest pain, shortness of breath,  lower extremity edema, dizziness, presyncope, or syncope.  The patient is otherwise without complaint today.  The patient denies symptoms of fevers, chills, cough, or new SOB worrisome for COVID 19.  She has a history significant for hypertension, hyperlipidemia, mitral valve repair with left atrial appendage clipping and maze in 2014.  She had a coronary angiogram in 2017 which was normal.  She has a history of amiodarone induced thyroiditis.  She has left ventricular dysfunction on echo in 2019.  Repeat echo shows an ejection fraction of 30 to 35%.  She wore a cardiac monitor that showed 22% PVCs and she was put on carvedilol which initially improved her symptoms.  She is now on both metoprolol and sotalol at a very low dose.  She continues to have weakness and fatigue and feels that her heart is stopping at times.  She is very anxious and almost tearful on the phone today.  She does not have chest pain, but does have some mild shortness of breath.  Today, denies symptoms of palpitations, chest pain, shortness  of breath, orthopnea, PND, lower extremity edema, claudication, dizziness, presyncope, syncope, bleeding, or neurologic sequela. The patient is tolerating medications without difficulties.    Past Medical History:  Diagnosis Date  . Acute coronary syndrome ([place]) 06/2015  . 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 Anemia   . Anxiety   . Aortic stenosis 05/19/2012   trivial by TEE  . Atrial fibrillation ([place])    a. Dx 04/2012;  b. Amio d/c'd 2/2 hyperthyroidism;  c. Atrial flutter 10/2012;  d. On xarelto.  [name] [name]   . CHF (congestive heart failure) ([place])   . Complication of anesthesia   . Coronary artery disease   . Diverticulitis   . GERD (gastroesophageal reflux disease)   . Goiter   . Headache   . Heart murmur   . Hyperlipidemia    recently- taken off Crestor- due to pain in her legs, but this was after cardiac cath. , pt. questioning whether the pain in her legs was related to cath. or crestor  . Hypertension    Then hypotension 04/2012 limiting med adjustment.  . Intractable vomiting   . Malnutrition of moderate degree ([place])   . Microscopic hematuria 08/20/2012  . Mitral valve prolapse    a. H/o MVP, with severe MR 04/2012, had MV repair and Maze  . Pneumonia    treated /w antibiotic- [facility]- early Feb. 2014  . PONV (postoperative nausea and vomiting)   . Refusal of blood transfusions as patient is Jehovah's Witness   . S/P Maze operation  for atrial fibrillation 06/28/2012   Complete bilateral atrial lesion set using cryothermy ablation with oversewing of LA appendage via right minithoracotomy  . S/P mitral valve repair 06/28/2012   Complex valvuloplasty including quadrangular resection of posterior leaflet, sliding leaflet plasty, artificial Gore-tex neocord placement x2 and 24mm Sorin Memo 3D ring annuloplasty via right mini thoracotomy   . SBO (small bowel obstruction) ([facility])   . Shortness of breath     Past Surgical History:  Procedure Laterality Date  . CARDIAC  CATHETERIZATION N/A 07/16/2015   Procedure: Left Heart Cath and Coronary Angiography;  Surgeon: Orpah Cobb, MD;  Location: [facility] INVASIVE CV LAB;  Service: Cardiovascular;  Laterality: N/A;  . CARDIOVERSION N/A 07/04/2018   Procedure: CARDIOVERSION;  Surgeon: [name], MD;  Location: [facility] ENDOSCOPY;  Service: Cardiovascular;  Laterality: N/A;  . CORONARY ARTERY BYPASS GRAFT    . INTRAOPERATIVE TRANSESOPHAGEAL ECHOCARDIOGRAM N/A 06/28/2012   Procedure: INTRAOPERATIVE TRANSESOPHAGEAL ECHOCARDIOGRAM;  Surgeon: Purcell Nails, MD;  Location: [facility] OR;  Service: Open Heart Surgery;  Laterality: N/A;  . LAPAROSCOPY N/A 05/21/2016   Procedure: LAPAROSCOPY DIAGNOSTIC, LYSIS OF ADHESION BAND;  Surgeon: Axel Filler, MD;  Location: [facility] OR;  Service: General;  Laterality: N/A;  . LEFT AND RIGHT HEART CATHETERIZATION WITH CORONARY ANGIOGRAM N/A 05/22/2012   Procedure: LEFT AND RIGHT HEART CATHETERIZATION WITH CORONARY ANGIOGRAM;  Surgeon: Kathleene Hazel, MD;  Location: [facility] CATH LAB;  Service: Cardiovascular;  Laterality: N/A;  . MINIMALLY INVASIVE MAZE PROCEDURE N/A 06/28/2012   Procedure: MINIMALLY INVASIVE MAZE PROCEDURE;  Surgeon: Purcell Nails, MD;  Location: [facility] OR;  Service: Open Heart Surgery;  Laterality: N/A;  . MITRAL VALVE REPAIR Right 06/28/2012   Procedure: MINIMALLY INVASIVE MITRAL VALVE REPAIR (MVR);  Surgeon: Purcell Nails, MD;  Location: [facility] OR;  Service: Open Heart Surgery;  Laterality: Right;  . OVARIAN CYST SURGERY  1988  . TEE WITHOUT CARDIOVERSION  05/19/2012   Procedure: TRANSESOPHAGEAL ECHOCARDIOGRAM (TEE);  Surgeon: Dolores Patty, MD;  Location: [facility] ENDOSCOPY;  Service: Cardiovascular;  Laterality: N/A;  . TUBAL LIGATION  1974  . URETHRAL STRICTURE DILATATION    . UTERINE FIBROID SURGERY  1988  . `      Current Outpatient Medications  Medication Sig Dispense Refill  . ALPRAZolam (XANAX) 0.25 MG tablet Take 0.5-1 tablets (0.125-0.25 mg total) by mouth 2 (two) times daily  as needed for anxiety. 30 tablet 0  . cholecalciferol (VITAMIN D) 1000 units tablet Take 1,000 Units by mouth daily.     . fluticasone (FLONASE) 50 MCG/ACT nasal spray Place 1 spray into both nostrils daily as needed for allergies.     [name] losartan (COZAAR) 25 MG tablet Take 1 tablet (25 mg total) by mouth daily.    . Menthol, Topical Analgesic, (ICY HOT EX) Apply 1 application topically daily as needed (back pain).    . methimazole (TAPAZOLE) 5 MG tablet Take 5 mg by mouth every morning.     . metoprolol tartrate 37.5 MG TABS Take 37.5 mg by mouth 2 (two) times daily.    . Multiple Vitamin (MULTIVITAMIN WITH MINERALS) TABS tablet Take 1 tablet by mouth daily.    . pantoprazole (PROTONIX) 40 MG tablet Take 40 mg by mouth daily.     . Rivaroxaban (XARELTO) 15 MG TABS tablet Take 1 tablet (15 mg total) by mouth daily with supper. 90 tablet 1  . senna-docusate (SENOKOT-S) 8.6-50 MG tablet Take 1 tablet by mouth 2 (two) times daily.    [name]  sotalol (BETAPACE) 80 MG tablet Take 0.5 tablets (40 mg total) by mouth 2 (two) times daily. 60 tablet 3   No current facility-administered medications for this visit.    Allergies:   Amiodarone, Diltiazem, Macrobid [nitrofurantoin monohyd macro], Mexiletine, Aspirin, Naproxen sodium, and Sulfa drugs cross reactors   Social History:  The patient  reports that she has never smoked. She has never used smokeless tobacco. She reports that she does not drink alcohol or use drugs.   Family History:  The patient's  family history includes Colon cancer in her father.   ROS:  Please see the history of present illness.   All other systems are personally reviewed and negative.    Exam:    Vital Signs:  BP 123/80   Pulse (!) 111   Over the phone, no acute distress, no shortness of breath.  Labs/Other Tests and Data Reviewed:    Recent Labs: 04/20/2019: B Natriuretic Peptide 224.6; TSH 1.323 04/22/2019: ALT 16; Magnesium 2.0 04/24/2019: BUN 10; Creatinine, Ser  0.69; Hemoglobin 14.9; Platelets 278; Potassium 3.6; Sodium 133   Wt Readings from Last 3 Encounters:  04/25/19 92 lb 9.5 oz (42 kg)  04/18/19 96 lb 14.4 oz (44 kg)  03/12/19 95 lb (43.1 kg)     Other studies personally reviewed: Additional studies/ records that were reviewed today include: TTE 04/21/19  Review of the above records today demonstrates:   1. Left ventricular ejection fraction, by visual estimation, is 30 to  35%. The left ventricle has severely decreased function. There is no left  ventricular hypertrophy.  2. Left ventricular diastolic parameters are consistent with Grade II  diastolic dysfunction (pseudonormalization).  3. The left ventricle demonstrates global hypokinesis.  4. Global right ventricle has normal systolic function.The right  ventricular size is normal. No increase in right ventricular wall  thickness.  5. Left atrial size was normal.  6. Right atrial size was severely dilated.  7. The mitral valve is normal in structure. No evidence of mitral valve  regurgitation. No evidence of mitral stenosis.  8. MV repair, mean gradient .  9. The tricuspid valve is normal in structure.  10. Aortic valve regurgitation is moderate.  11. The aortic valve is tricuspid. Aortic valve regurgitation is moderate.  No evidence of aortic valve sclerosis or stenosis.  12. The pulmonic valve was normal in structure. Pulmonic valve  regurgitation is not visualized.  13. The inferior vena cava is normal in size with greater than 50%  respiratory variability, suggesting right atrial pressure of 3 mmHg.   ECG 04/20/2019 personally reviewed Sinus tachycardia, rate 115, PVCs  ASSESSMENT & PLAN:    1. PVCs: High burden at upwards of 20%.  At this point, she is on both metoprolol and sotalol which has in the past improved her symptoms.  She is having quite a few episodes of feeling that her heart is slowing down.  We Torien Ramroop fit her with a 14-day monitor to further  assess.  2. Chronic systolic heart failure due to nonischemic cardiomyopathy: Potentially PVC induced.  Plan for 14-day monitor.  She may benefit from an increased dose of her sotalol.  3. Paroxysmal atrial fibrillation: Status post maze with left atrial appendage clipping. CHA2DS2-VASc of 3. Currently on sotalol, metoprolol, Xarelto.   COVID 19 screen The patient denies symptoms of COVID 19 at this time.  The importance of social distancing was discussed today.  Follow-up: 3 months  Current medicines are reviewed at length with the patient today.  The patient does not have concerns regarding her medicines.  The following changes were made today:  none  Labs/ tests ordered today include: 14 day zio    Patient Risk:  after full review of this patients clinical status, I feel that they are at moderate risk at this time.  Today, I have spent 10 minutes with the patient with telehealth technology discussing AF, PVCs .    Signed, Darsha Zumstein Jorja Loa, MD  06/05/2019 4:28 PM     [facility] [address] [place] [address] [phone] (office) [phone] (fax)

## 2019-06-07 ENCOUNTER — Telehealth: Payer: Self-pay | Admitting: Radiology

## 2019-06-07 DIAGNOSIS — I110 Hypertensive heart disease with heart failure: Secondary | ICD-10-CM | POA: Diagnosis not present

## 2019-06-07 DIAGNOSIS — E785 Hyperlipidemia, unspecified: Secondary | ICD-10-CM | POA: Diagnosis not present

## 2019-06-07 DIAGNOSIS — K219 Gastro-esophageal reflux disease without esophagitis: Secondary | ICD-10-CM | POA: Diagnosis not present

## 2019-06-07 DIAGNOSIS — I480 Paroxysmal atrial fibrillation: Secondary | ICD-10-CM | POA: Diagnosis not present

## 2019-06-07 DIAGNOSIS — Z7901 Long term (current) use of anticoagulants: Secondary | ICD-10-CM | POA: Diagnosis not present

## 2019-06-07 DIAGNOSIS — Z87440 Personal history of urinary (tract) infections: Secondary | ICD-10-CM | POA: Diagnosis not present

## 2019-06-07 DIAGNOSIS — Z9181 History of falling: Secondary | ICD-10-CM | POA: Diagnosis not present

## 2019-06-07 DIAGNOSIS — K5790 Diverticulosis of intestine, part unspecified, without perforation or abscess without bleeding: Secondary | ICD-10-CM | POA: Diagnosis not present

## 2019-06-07 DIAGNOSIS — Z7951 Long term (current) use of inhaled steroids: Secondary | ICD-10-CM | POA: Diagnosis not present

## 2019-06-07 DIAGNOSIS — E0590 Thyrotoxicosis, unspecified without thyrotoxic crisis or storm: Secondary | ICD-10-CM | POA: Diagnosis not present

## 2019-06-07 DIAGNOSIS — Z952 Presence of prosthetic heart valve: Secondary | ICD-10-CM | POA: Diagnosis not present

## 2019-06-07 DIAGNOSIS — E46 Unspecified protein-calorie malnutrition: Secondary | ICD-10-CM | POA: Diagnosis not present

## 2019-06-07 DIAGNOSIS — I5020 Unspecified systolic (congestive) heart failure: Secondary | ICD-10-CM | POA: Diagnosis not present

## 2019-06-07 DIAGNOSIS — F419 Anxiety disorder, unspecified: Secondary | ICD-10-CM | POA: Diagnosis not present

## 2019-06-07 NOTE — Addendum Note (Signed)
Addended by: Baird Lyons on: 06/07/2019 09:56 AM   Modules accepted: Orders

## 2019-06-07 NOTE — Telephone Encounter (Signed)
Enrolled patient for a 14 day Zio monitor to be mailed to patients home.  

## 2019-06-11 DIAGNOSIS — F329 Major depressive disorder, single episode, unspecified: Secondary | ICD-10-CM | POA: Diagnosis not present

## 2019-06-11 DIAGNOSIS — F419 Anxiety disorder, unspecified: Secondary | ICD-10-CM | POA: Diagnosis not present

## 2019-06-13 ENCOUNTER — Telehealth: Payer: Self-pay | Admitting: Cardiology

## 2019-06-13 NOTE — Telephone Encounter (Signed)
Patient is calling asking to speak wit Dr. Elberta Fortis. What the patient was saying was very unclear. It seems as if she began vomiting while on the phone and she kept stating to "hurry". Transferred call to Dory Horn who the patient had previously spoke with.

## 2019-06-13 NOTE — Telephone Encounter (Signed)
Pt directly transferred to speak with. She reports that she hasn't thrown up yet, but feels like she is going to.  She states she was constipated yesterday/last night.  She took a laxative and now she has diarrhea, her stomach hurts and she is nauseous.   She keeps asking when she should be taking her Sotalol.  Pt informed BID.  When asked when she takes it she reports taking it around 10 am and then again at 3 pm.  Pt advised this is too close and she needs to space out when she takes her Sotalol, advised to take when she gets up in the morning and again in the evening.  Patient verbalized understanding and agreeable to plan.  Pt advised to call her PCP (which she stated she had, but wasn't able to speak with a live person, so she called [place], and then here till she could speak with someone immediately).  Advised to call and speak with PCP about current nausea issue and to monitor diarrhea with possible electrolyte loss if continues.  She is tearful, but agreeable. She then reports that her HR is in low 110s yesterday and today.  Advised pt that due to current condition this may be a factor in elevated HRs and to f/u w/ PCP and if issue persists afterwards to call back to discuss. Patient verbalized understanding and agreeable to plan.

## 2019-06-13 NOTE — Telephone Encounter (Signed)
  STAT if HR is under 50 or over 120 (normal HR is 60-100 beats per minute)  1) What is your heart rate? Yesterday HR was 112  2) Do you have a log of your heart rate readings (document readings)? no  3) Do you have any other symptoms? Patient states her stomach is hurting and her HR has been high. She states she has questions about taking her sotalol (BETAPACE) 80 MG tablet as well. Please advise.

## 2019-06-22 DIAGNOSIS — Z87440 Personal history of urinary (tract) infections: Secondary | ICD-10-CM | POA: Diagnosis not present

## 2019-06-22 DIAGNOSIS — Z7901 Long term (current) use of anticoagulants: Secondary | ICD-10-CM | POA: Diagnosis not present

## 2019-06-22 DIAGNOSIS — K219 Gastro-esophageal reflux disease without esophagitis: Secondary | ICD-10-CM | POA: Diagnosis not present

## 2019-06-22 DIAGNOSIS — Z7951 Long term (current) use of inhaled steroids: Secondary | ICD-10-CM | POA: Diagnosis not present

## 2019-06-22 DIAGNOSIS — K5790 Diverticulosis of intestine, part unspecified, without perforation or abscess without bleeding: Secondary | ICD-10-CM | POA: Diagnosis not present

## 2019-06-22 DIAGNOSIS — F419 Anxiety disorder, unspecified: Secondary | ICD-10-CM | POA: Diagnosis not present

## 2019-06-22 DIAGNOSIS — E0590 Thyrotoxicosis, unspecified without thyrotoxic crisis or storm: Secondary | ICD-10-CM | POA: Diagnosis not present

## 2019-06-22 DIAGNOSIS — I5020 Unspecified systolic (congestive) heart failure: Secondary | ICD-10-CM | POA: Diagnosis not present

## 2019-06-22 DIAGNOSIS — E46 Unspecified protein-calorie malnutrition: Secondary | ICD-10-CM | POA: Diagnosis not present

## 2019-06-22 DIAGNOSIS — I110 Hypertensive heart disease with heart failure: Secondary | ICD-10-CM | POA: Diagnosis not present

## 2019-06-22 DIAGNOSIS — Z9181 History of falling: Secondary | ICD-10-CM | POA: Diagnosis not present

## 2019-06-22 DIAGNOSIS — E785 Hyperlipidemia, unspecified: Secondary | ICD-10-CM | POA: Diagnosis not present

## 2019-06-22 DIAGNOSIS — I480 Paroxysmal atrial fibrillation: Secondary | ICD-10-CM | POA: Diagnosis not present

## 2019-06-22 DIAGNOSIS — Z952 Presence of prosthetic heart valve: Secondary | ICD-10-CM | POA: Diagnosis not present

## 2019-06-23 ENCOUNTER — Emergency Department (HOSPITAL_COMMUNITY)
Admission: EM | Admit: 2019-06-23 | Discharge: 2019-06-23 | Disposition: A | Discharge status: Home / Self Care | Payer: PPO | Attending: Emergency Medicine | Admitting: Emergency Medicine

## 2019-06-23 ENCOUNTER — Encounter (HOSPITAL_COMMUNITY): Payer: Self-pay | Admitting: Emergency Medicine

## 2019-06-23 ENCOUNTER — Emergency Department (HOSPITAL_COMMUNITY): Payer: PPO

## 2019-06-23 ENCOUNTER — Other Ambulatory Visit: Payer: Self-pay

## 2019-06-23 DIAGNOSIS — K5120 Ulcerative (chronic) proctitis without complications: Secondary | ICD-10-CM | POA: Diagnosis not present

## 2019-06-23 DIAGNOSIS — Z7901 Long term (current) use of anticoagulants: Secondary | ICD-10-CM | POA: Diagnosis not present

## 2019-06-23 DIAGNOSIS — K5641 Fecal impaction: Secondary | ICD-10-CM | POA: Diagnosis not present

## 2019-06-23 DIAGNOSIS — Z79899 Other long term (current) drug therapy: Secondary | ICD-10-CM | POA: Diagnosis not present

## 2019-06-23 DIAGNOSIS — I110 Hypertensive heart disease with heart failure: Secondary | ICD-10-CM | POA: Insufficient documentation

## 2019-06-23 DIAGNOSIS — R1084 Generalized abdominal pain: Secondary | ICD-10-CM | POA: Diagnosis not present

## 2019-06-23 DIAGNOSIS — I509 Heart failure, unspecified: Secondary | ICD-10-CM | POA: Insufficient documentation

## 2019-06-23 DIAGNOSIS — K6289 Other specified diseases of anus and rectum: Secondary | ICD-10-CM | POA: Insufficient documentation

## 2019-06-23 DIAGNOSIS — I4891 Unspecified atrial fibrillation: Secondary | ICD-10-CM | POA: Diagnosis not present

## 2019-06-23 DIAGNOSIS — K5900 Constipation, unspecified: Secondary | ICD-10-CM | POA: Diagnosis not present

## 2019-06-23 DIAGNOSIS — R000 Tachycardia, unspecified: Secondary | ICD-10-CM | POA: Diagnosis not present

## 2019-06-23 DIAGNOSIS — K5730 Diverticulosis of large intestine without perforation or abscess without bleeding: Secondary | ICD-10-CM | POA: Diagnosis not present

## 2019-06-23 DIAGNOSIS — J439 Emphysema, unspecified: Secondary | ICD-10-CM | POA: Diagnosis not present

## 2019-06-23 LAB — CBC WITH DIFFERENTIAL/PLATELET
Abs Immature Granulocytes: 0.02 10*3/uL (ref 0.00–0.07)
Basophils Absolute: 0 10*3/uL (ref 0.0–0.1)
Basophils Relative: 1 %
Eosinophils Absolute: 0 10*3/uL (ref 0.0–0.5)
Eosinophils Relative: 0 %
HCT: 42.2 % (ref 36.0–46.0)
Hemoglobin: 13.2 g/dL (ref 12.0–15.0)
Immature Granulocytes: 0 %
Lymphocytes Relative: 17 %
Lymphs Abs: 1.5 10*3/uL (ref 0.7–4.0)
MCH: 30.6 pg (ref 26.0–34.0)
MCHC: 31.3 g/dL (ref 30.0–36.0)
MCV: 97.9 fL (ref 80.0–100.0)
Monocytes Absolute: 0.5 10*3/uL (ref 0.1–1.0)
Monocytes Relative: 6 %
Neutro Abs: 6.7 10*3/uL (ref 1.7–7.7)
Neutrophils Relative %: 76 %
Platelets: 215 10*3/uL (ref 150–400)
RBC: 4.31 MIL/uL (ref 3.87–5.11)
RDW: 14.8 % (ref 11.5–15.5)
WBC: 8.9 10*3/uL (ref 4.0–10.5)
nRBC: 0 % (ref 0.0–0.2)

## 2019-06-23 LAB — URINALYSIS, ROUTINE W REFLEX MICROSCOPIC
Bilirubin Urine: NEGATIVE
Glucose, UA: NEGATIVE mg/dL
Ketones, ur: NEGATIVE mg/dL
Leukocytes,Ua: NEGATIVE
Nitrite: NEGATIVE
Protein, ur: NEGATIVE mg/dL
Specific Gravity, Urine: 1.006 (ref 1.005–1.030)
pH: 7 (ref 5.0–8.0)

## 2019-06-23 LAB — COMPREHENSIVE METABOLIC PANEL
ALT: 28 U/L (ref 0–44)
AST: 24 U/L (ref 15–41)
Albumin: 3.9 g/dL (ref 3.5–5.0)
Alkaline Phosphatase: 67 U/L (ref 38–126)
Anion gap: 9 (ref 5–15)
BUN: 15 mg/dL (ref 8–23)
CO2: 22 mmol/L (ref 22–32)
Calcium: 9.5 mg/dL (ref 8.9–10.3)
Chloride: 104 mmol/L (ref 98–111)
Creatinine, Ser: 0.76 mg/dL (ref 0.44–1.00)
GFR calc Af Amer: >60 mL/min (ref >60)
GFR calc non Af Amer: >60 mL/min (ref >60)
Glucose, Bld: 90 mg/dL (ref 70–99)
Potassium: 4.3 mmol/L (ref 3.5–5.1)
Sodium: 135 mmol/L (ref 135–145)
Total Bilirubin: 0.9 mg/dL (ref 0.3–1.2)
Total Protein: 6.6 g/dL (ref 6.5–8.1)

## 2019-06-23 LAB — POC OCCULT BLOOD, ED: Fecal Occult Bld: NEGATIVE

## 2019-06-23 LAB — LIPASE, BLOOD: Lipase: 28 U/L (ref 11–51)

## 2019-06-23 MED ORDER — SORBITOL 70 % SOLN
960.0000 mL | TOPICAL_OIL | Freq: Once | ORAL | Status: AC
  Administered 2019-06-23: 960 mL via RECTAL
  Filled 2019-06-23 (×2): qty 473

## 2019-06-23 MED ORDER — POLYETHYLENE GLYCOL 3350 17 G PO PACK: 17.0000 g | PACK | Freq: Every day | ORAL | 0 refills | Status: AC

## 2019-06-23 MED ORDER — AMOXICILLIN-POT CLAVULANATE 875-125 MG PO TABS: 1.0000 | ORAL_TABLET | Freq: Two times a day (BID) | ORAL | 0 refills | Status: DC

## 2019-06-23 MED ORDER — SODIUM CHLORIDE (PF) 0.9 % IJ SOLN
INTRAMUSCULAR | Status: AC
  Filled 2019-06-23: qty 50

## 2019-06-23 MED ORDER — IOHEXOL 300 MG/ML  SOLN
75.0000 mL | Freq: Once | INTRAMUSCULAR | Status: AC | PRN
  Administered 2019-06-23: 75 mL via INTRAVENOUS

## 2019-06-23 NOTE — ED Notes (Signed)
Patient given sprite.

## 2019-06-23 NOTE — ED Notes (Signed)
Patient passed large ball of stool. MD made aware.

## 2019-06-23 NOTE — ED Triage Notes (Signed)
Per EMS, patient from home, reports constipation x3 days. Hx SBO. Denies N/V. Ambulatory with EMS. Last BM x3 days ago.

## 2019-06-23 NOTE — ED Notes (Signed)
Patient transported to CT 

## 2019-06-23 NOTE — ED Provider Notes (Signed)
[place] DEPT Provider Note   CSN: [id number] Arrival date & time: 06/23/19  1443     History Chief Complaint  Patient presents with  . Constipation    [name] [name] is a 79 y.o. female.  Patient presents from home with constipation and rectal pain.  States her last bowel movement was 2 or 3 days ago and normally goes every day.  States she has had only small hard stools for the past several days and her home health caregiver tried to give her an enema just prior to arrival.  She reports this is not successful and she was told she had to come to the hospital.  She denies any nausea or vomiting.  No fever.  No chest pain or shortness of breath.  She does take Xarelto for history of atrial fibrillation and reported that there was some blood after she was trying to remove the stool manually.  Her stool has not been black or bloody.  She denies any pain with urination or blood in the urine.  Denies any previous abdominal surgery  79 y.o. female with medical history significant for atrial fibrillation/ H/o MVP, with severe MR 04/2012-s/p MV repair and Maze procedure, CAD-last cath 1443, systolic CHF LVEF 15% in 40/0867, chronic anemia, anxiety disorder, hyperlipidemia unable to tolerate Crestor, history of diverticulitis-last Colonoscopy in 2013,  The history is provided by the patient.  Constipation Associated symptoms: abdominal pain   Associated symptoms: no dysuria, no fever and no vomiting        Past Medical History:  Diagnosis Date  . Acute coronary syndrome ([place]) 06/2015  . 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 Anemia   . Anxiety   . Aortic stenosis 05/19/2012   trivial by TEE  . Atrial fibrillation ([place])    a. Dx 04/2012;  b. Amio d/c'd 2/2 hyperthyroidism;  c. Atrial flutter 10/2012;  d. On xarelto.  [name]   . CHF (congestive heart failure) ([place])   . Complication of anesthesia   . Coronary artery disease   . Diverticulitis     . GERD (gastroesophageal reflux disease)   . Goiter   . Headache   . Heart murmur   . Hyperlipidemia    recently- taken off Crestor- due to pain in her legs, but this was after cardiac cath. , pt. questioning whether the pain in her legs was related to cath. or crestor  . Hypertension    Then hypotension 04/2012 limiting med adjustment.  . Intractable vomiting   . Malnutrition of moderate degree ([place])   . Microscopic hematuria 08/20/2012  . Mitral valve prolapse    a. H/o MVP, with severe MR 04/2012, had MV repair and Maze  . Pneumonia    treated /w antibiotic- [facility]- early Feb. 2014  . PONV (postoperative nausea and vomiting)   . Refusal of blood transfusions as patient is Jehovah's Witness   . S/P Maze operation for atrial fibrillation 06/28/2012   Complete bilateral atrial lesion set using cryothermy ablation with oversewing of LA appendage via right minithoracotomy  . S/P mitral valve repair 06/28/2012   Complex valvuloplasty including quadrangular resection of posterior leaflet, sliding leaflet plasty, artificial Gore-tex neocord placement x2 and 79mm Sorin Memo 3D ring annuloplasty via right mini thoracotomy   . SBO (small bowel obstruction) ([place])   . Shortness of breath     Patient Active Problem List   Diagnosis Date Noted  . Protein-calorie malnutrition, severe 04/24/2019  . Hypokalemia 04/20/2019  .  Hypomagnesemia 04/20/2019  . Anemia 04/20/2019  . Patient is Jehovah's Witness 04/20/2019  . Acquired thrombophilia ([facility]) 04/05/2019  . Malnutrition of moderate degree 05/17/2016  . Bigeminy   . Intractable vomiting 05/15/2016  . Heart palpitations 05/15/2016  . SBO (small bowel obstruction) (HCC) 05/15/2016  . Acute coronary syndrome (H[facility]) 07/15/2015  . Status post Maze operation for atrial fibrillation 01/08/2013  . S/P MVR (mitral valve repair) 01/08/2013  . Atrial flutter (H[facility]) 10/30/2012  . Amaurosis fugax 10/30/2012  . Dyspnea 10/28/2012  . Amiodarone Induced  Thyrotoxicosis 08/21/2012  . Thyroid goiter 08/21/2012  . Microscopic hematuria 08/20/2012  . S/P mitral valve repair 06/28/2012  . History of mitral valve prolapse with severe mitral regurgtation 05/19/2012  . Refusal of blood transfusions as patient is Jehovah's Witness 05/19/2012  . Atrial fibrillation (H[facility]) 05/16/2012  . Anxiety 04/27/2011  . Hypertension 04/26/2011  . Diverticulitis 04/24/2011  . Hyperlipidemia     Past Surgical History:  Procedure Laterality Date  . CARDIAC CATHETERIZATION N/A 07/16/2015   Procedure: Left Heart Cath and Coronary Angiography;  Surgeon: Orpah Cobb, MD;  Location: [facility] INVASIVE CV LAB;  Service: Cardiovascular;  Laterality: N/A;  . CARDIOVERSION N/A 07/04/2018   Procedure: CARDIOVERSION;  Surgeon: [name], MD;  Location: [facility] ENDOSCOPY;  Service: Cardiovascular;  Laterality: N/A;  . CORONARY ARTERY BYPASS GRAFT    . INTRAOPERATIVE TRANSESOPHAGEAL ECHOCARDIOGRAM N/A 06/28/2012   Procedure: INTRAOPERATIVE TRANSESOPHAGEAL ECHOCARDIOGRAM;  Surgeon: Purcell Nails, MD;  Location: [facility] OR;  Service: Open Heart Surgery;  Laterality: N/A;  . LAPAROSCOPY N/A 05/21/2016   Procedure: LAPAROSCOPY DIAGNOSTIC, LYSIS OF ADHESION BAND;  Surgeon: Axel Filler, MD;  Location: [facility] OR;  Service: General;  Laterality: N/A;  . LEFT AND RIGHT HEART CATHETERIZATION WITH CORONARY ANGIOGRAM N/A 05/22/2012   Procedure: LEFT AND RIGHT HEART CATHETERIZATION WITH CORONARY ANGIOGRAM;  Surgeon: Kathleene Hazel, MD;  Location[facility] CATH LAB;  Service: Cardiovascular;  Laterality: N/A;  . MINIMALLY INVASIVE MAZE PROCEDURE N/A 06/28/2012   Procedure: MINIMALLY INVASIVE MAZE PROCEDURE;  Surgeon: Purcell Nails, MD;  Location: [facility] OR;  Service: Open Heart Surgery;  Laterality: N/A;  . MITRAL VALVE REPAIR Right 06/28/2012   Procedure: MINIMALLY INVASIVE MITRAL VALVE REPAIR (MVR);  Surgeon: Purcell Nails, MD;  Location: [facility] OR;  Service: Open Heart Surgery;  Laterality: Right;  .  OVARIAN CYST SURGERY  1988  . TEE WITHOUT CARDIOVERSION  05/19/2012   Procedure: TRANSESOPHAGEAL ECHOCARDIOGRAM (TEE);  Surgeon: Dolores Patty, MD;  Location: [facility] ENDOSCOPY;  Service: Cardiovascular;  Laterality: N/A;  . TUBAL LIGATION  1974  . URETHRAL STRICTURE DILATATION    . UTERINE FIBROID SURGERY  1988  . `       OB History   No obstetric history on file.     Family History  Problem Relation Age of Onset  . Colon cancer Father     Social History   Tobacco Use  . Smoking status: Never Smoker  . Smokeless tobacco: Never Used  Substance Use Topics  . Alcohol use: No    Comment: 1 drink a month  . Drug use: No    Home Medications Prior to Admission medications   Medication Sig Start Date End Date Taking? Authorizing Provider  ALPRAZolam (XANAX) 0.25 MG tablet Take 0.5-1 tablets (0.125-0.25 mg total) by mouth 2 (two) times daily as needed for anxiety. 04/25/19   Briant Cedar, MD  cholecalciferol (VITAMIN D) 1000 units tablet Take 1,000 Units by mouth daily.  [provider]  fluticasone (FLONASE) 50 MCG/ACT nasal spray Place 1 spray into both nostrils daily as needed for allergies.  12/08/12   [provider]  losartan (COZAAR) 25 MG tablet Take 1 tablet (25 mg total) by mouth daily. 04/26/19   Briant Cedar, MD  Menthol, Topical Analgesic, (ICY HOT EX) Apply 1 application topically daily as needed (back pain).    [provider]  methimazole (TAPAZOLE) 5 MG tablet Take 5 mg by mouth every morning.     [provider]  metoprolol tartrate 37.5 MG TABS Take 37.5 mg by mouth 2 (two) times daily. 04/25/19   Briant Cedar, MD  Multiple Vitamin (MULTIVITAMIN WITH MINERALS) TABS tablet Take 1 tablet by mouth daily. 04/26/19   Briant Cedar, MD  pantoprazole (PROTONIX) 40 MG tablet Take 40 mg by mouth daily.  08/24/12   Geoffry Paradise, MD  Rivaroxaban (XARELTO) 15 MG TABS tablet Take 1 tablet (15 mg total) by  mouth daily with supper. 03/16/19   Camnitz, Will Daphine Deutscher, MD  senna-docusate (SENOKOT-S) 8.6-50 MG tablet Take 1 tablet by mouth 2 (two) times daily. 04/25/19   Briant Cedar, MD  sotalol (BETAPACE) 80 MG tablet Take 0.5 tablets (40 mg total) by mouth 2 (two) times daily. 02/20/19   Camnitz, Andree Coss, MD    Allergies    Amiodarone, Diltiazem, Macrobid [nitrofurantoin monohyd macro], Mexiletine, Aspirin, Naproxen sodium, and Sulfa drugs cross reactors  Review of Systems   Review of Systems  Constitutional: Negative for activity change, appetite change, fatigue and fever.  HENT: Negative for congestion and rhinorrhea.   Respiratory: Negative for cough, chest tightness and shortness of breath.   Cardiovascular: Negative for chest pain.  Gastrointestinal: Positive for abdominal pain, constipation and rectal pain. Negative for vomiting.  Genitourinary: Negative for dysuria and hematuria.  Musculoskeletal: Negative for arthralgias and myalgias.  Skin: Negative for rash.  Neurological: Negative for dizziness, weakness and headaches.   all other systems are negative except as noted in the HPI and PMH.    Physical Exam Updated Vital Signs BP (!) 142/68 (BP Location: Right Arm)   Pulse (!) 107   Temp 98.5 F (36.9 C) (Oral)   Resp 16   Ht 5\' 6"  (1.676 m)   Wt 40.8 kg   SpO2 100%   BMI 14.53 kg/m   Physical Exam Vitals and nursing note reviewed.  Constitutional:      General: She is not in acute distress.    Appearance: She is well-developed.  HENT:     Head: Normocephalic and atraumatic.     Mouth/Throat:     Pharynx: No oropharyngeal exudate.  Eyes:     Conjunctiva/sclera: Conjunctivae normal.     Pupils: Pupils are equal, round, and reactive to light.  Neck:     Comments: No meningismus. Cardiovascular:     Rate and Rhythm: Normal rate. Rhythm irregular.     Heart sounds: Normal heart sounds. No murmur.  Pulmonary:     Effort: Pulmonary effort is normal. No  respiratory distress.     Breath sounds: Normal breath sounds.  Abdominal:     Palpations: Abdomen is soft.     Tenderness: There is no abdominal tenderness. There is no guarding or rebound.  Genitourinary:    Comments: Chaperone present.  No external hemorrhoids.  There is firm stool just palpable past fingertip.  Unable to manually removed.  No bleeding Musculoskeletal:        General: No tenderness.  Normal range of motion.     Cervical back: Normal range of motion and neck supple.  Skin:    General: Skin is warm.     Capillary Refill: Capillary refill takes less than 2 seconds.  Neurological:     General: No focal deficit present.     Mental Status: She is alert and oriented to person, place, and time. Mental status is at baseline.     Cranial Nerves: No cranial nerve deficit.     Motor: No abnormal muscle tone.     Coordination: Coordination normal.     Comments: No ataxia on finger to nose bilaterally. No pronator drift. 5/5 strength throughout. CN 2-12 intact.Equal grip strength. Sensation intact.   Psychiatric:        Behavior: Behavior normal.     ED Results / Procedures / Treatments   Labs (all labs ordered are listed, but only abnormal results are displayed) Labs Reviewed  URINALYSIS, ROUTINE W REFLEX MICROSCOPIC - Abnormal; Notable for the following components:      Result Value   Hgb urine dipstick SMALL (*)    Bacteria, UA RARE (*)    All other components within normal limits  CBC WITH DIFFERENTIAL/PLATELET  COMPREHENSIVE METABOLIC PANEL  LIPASE, BLOOD  POC OCCULT BLOOD, ED    EKG EKG Interpretation  Date/Time:  Saturday June 23 2019 21:32:36 EST Ventricular Rate:  109 PR Interval:    QRS Duration: 87 QT Interval:  333 QTC Calculation: 449 R Axis:   -78 Text Interpretation: Sinus tachycardia Ventricular premature complex Left anterior fascicular block LVH with secondary repolarization abnormality Anterior infarct, old Baseline wander in lead(s) V1 No  significant change was found Confirmed by Glynn Octave [phone]) on 06/23/2019 9:36:48 PM   Radiology CT ABDOMEN PELVIS W CONTRAST  Result Date: 06/23/2019 CLINICAL DATA:  79 year old female with concern for bowel obstruction. EXAM: CT ABDOMEN AND PELVIS WITH CONTRAST TECHNIQUE: Multidetector CT imaging of the abdomen and pelvis was performed using the standard protocol following bolus administration of intravenous contrast. CONTRAST:  53mL OMNIPAQUE IOHEXOL 300 MG/ML  SOLN COMPARISON:  CT abdomen pelvis dated 04/20/2019. FINDINGS: Lower chest: Partially visualized small right pleural effusion. The visualized left lung base is clear. No intra-abdominal free air. No significant free fluid. Hepatobiliary: The liver is unremarkable. No intrahepatic biliary ductal dilatation. The gallbladder is unremarkable. Pancreas: Unremarkable. No pancreatic ductal dilatation or surrounding inflammatory changes. Spleen: Normal in size without focal abnormality. Adrenals/Urinary Tract: The adrenal glands are unremarkable. There is no hydronephrosis on either side. There is symmetric enhancement and excretion of contrast by both kidneys. Subcentimeter left renal inferior pole hypodense lesion is not well characterized but most likely represents a cyst. The urinary bladder is grossly unremarkable. Stomach/Bowel: There is inflammatory changes and thickening of the rectosigmoid most consistent with proctocolitis. Clinical correlation is recommended. There is enhancement of the rectosigmoid mucosa. There is moderate amount of stool throughout the colon. There is sigmoid diverticulosis without active inflammatory changes. No bowel obstruction. The appendix is unremarkable as visualized. Vascular/Lymphatic: Advanced aortoiliac atherosclerotic disease. The IVC is unremarkable. No portal venous gas. There is no adenopathy. Reproductive: The uterus and ovaries are grossly unremarkable. Other: There is loss of subcutaneous fat and  cachexia. Musculoskeletal: Advanced osteopenia with degenerative changes of the spine. Old compression fracture of the superior endplate of L3. No acute osseous pathology. IMPRESSION: 1. Inflammatory changes of the rectosigmoid most consistent with proctocolitis. Clinical correlation is recommended. No bowel obstruction. 2. Colonic diverticulosis. 3. Partially visualized small  right pleural effusion. 4. Aortic Atherosclerosis (ICD10-I70.0). Electronically Signed   By: Elgie Collard M.D.   On: 06/23/2019 21:10   DG Abdomen Acute W/Chest  Result Date: 06/23/2019 CLINICAL DATA:  79 year old female with constipation and rectal pain. EXAM: DG ABDOMEN ACUTE W/ 1V CHEST COMPARISON:  Chest radiograph dated 03/12/2019. FINDINGS: There is background of emphysema. No focal consolidation, pleural effusion, or pneumothorax. Mild cardiomegaly. Atherosclerotic calcification of the aorta. Mechanical cardiac valve. There is no bowel dilatation or evidence of obstruction. No significant colonic stool burden. No free air or radiopaque calculi. Osteopenia with degenerative changes of the spine. No acute osseous pathology. IMPRESSION: 1. No acute cardiopulmonary process. 2. Emphysema. 3. No bowel obstruction. Electronically Signed   By: Elgie Collard M.D.   On: 06/23/2019 16:41    Procedures Fecal disimpaction  Date/Time: 06/23/2019 8:38 PM Performed by: Glynn Octave, MD Authorized by: Glynn Octave, MD  Consent: Verbal consent obtained. Risks and benefits: risks, benefits and alternatives were discussed Consent given by: patient Patient understanding: patient states understanding of the procedure being performed Patient consent: the patient's understanding of the procedure matches consent given Procedure consent: procedure consent matches procedure scheduled Relevant documents: relevant documents present and verified Test results: test results available and properly labeled Site marked: the operative  site was marked Imaging studies: imaging studies available Patient identity confirmed: verbally with patient Time out: Immediately prior to procedure a "time out" was called to verify the correct patient, procedure, equipment, support staff and site/side marked as required. Local anesthesia used: no  Anesthesia: Local anesthesia used: no  Sedation: Patient sedated: no  Patient tolerance: patient tolerated the procedure well with no immediate complications    (including critical care time)  Medications Ordered in ED Medications - No data to display  ED Course  I have reviewed the triage vital signs and the nursing notes.  Pertinent labs & imaging results that were available during my care of the patient were reviewed by me and considered in my medical decision making (see chart for details).    MDM Rules/Calculators/A&P                     Patient with multiple medical problems here with constipation and rectal pain for the past 2 or 3 days.  Her abdomen is soft.  Labs are reassuring.  Patient was given multiple enemas with eventual success.  Abdominal series negative for bowel obstruction.  Her abdomen is soft on recheck and she feels much better.  Patient feels much improved after having a large bowel movement.  Discussed using MiraLAX twice daily for 2 or 3 days and then once daily.  No fever.  No vomiting.  Abdomen is soft and nontender.  He has been tachycardic which appears to be her baseline.  She denies any chest pain or shortness of breath.  We will give empiric antibiotics for suspected proctitis Follow-up with her PCP as well as her specialist.  Return precautions discussed. Final Clinical Impression(s) / ED Diagnoses Final diagnoses:  Fecal impaction [facility])  Proctitis    Rx / DC Orders ED Discharge Orders    None       Hadley Soileau, Jeannett Senior, MD 06/24/19 0028

## 2019-06-23 NOTE — ED Notes (Signed)
Patient transported to XR. 

## 2019-06-23 NOTE — ED Notes (Signed)
Provided pt with [place] sandwich and a drink.

## 2019-06-23 NOTE — Discharge Instructions (Signed)
Take the antibiotics as prescribed.  As we discussed, use the MiraLAX twice daily for the next 3 days and then once daily.  Follow-up with your doctor next week.  Return to the ED with worsening pain, fever, vomiting, or other concerns.

## 2019-06-25 ENCOUNTER — Telehealth: Payer: Self-pay | Admitting: Physician Assistant

## 2019-06-25 NOTE — Telephone Encounter (Signed)
Pt nervous about becoming impacted again. Reviewed with her ER instructions to take miralax 2-3 doses for a couple of days and then to continue daily dose. Pt has OV scheduled next week.

## 2019-06-25 NOTE — Telephone Encounter (Signed)
Pt was seen in the ED for fecal impaction.  She is scheduled for an OV 07/05/19 but is anxious and would like to discuss with a nurse.

## 2019-06-26 ENCOUNTER — Telehealth: Payer: Self-pay | Admitting: Cardiology

## 2019-06-26 NOTE — Telephone Encounter (Signed)
Returned pt call. Reports a "bowel impaction this weekend".  States she has been having a rough time recently. Pt instructed that even with her recent "issues" she can put monitor on.  Pt states she will put monitor on tonight.  Will forward to monitor team so they are updated that pt has not started monitor yet.  They spoke to her 2/11 w/ instructions of monitor being mailed out that day.

## 2019-06-26 NOTE — Telephone Encounter (Signed)
New message  Patient states that she needs to speak with Dr. Elberta Fortis about the monitor. Patient states that she has not been able to use it due to a medical emergency that she experienced over the weekend and wants to speak with Dr. Elberta Fortis or his nurse about it. Please give patient a call back to discuss.

## 2019-06-29 NOTE — Telephone Encounter (Signed)
Patient had some follow-up questions from her call she made on Tuesday. She only wanted to speak with Sherri or Dr. Elberta Fortis about her concerns.   Please call her back

## 2019-06-29 NOTE — Telephone Encounter (Signed)
Spoke to pt. She wanted to know if Dr. Elberta Fortis would prescribe her something for sleep.  Informed pt that she will need to go through her PCP to discuss this need and that Dr. Elberta Fortis, her Electrophysiologist, will not prescribe sleep medications. Pt states she tried to call them but no answer,  advised pt to call their office back to discuss. Patient verbalized understanding and agreeable to plan.

## 2019-07-05 ENCOUNTER — Encounter: Payer: Self-pay | Admitting: Physician Assistant

## 2019-07-05 ENCOUNTER — Ambulatory Visit (INDEPENDENT_AMBULATORY_CARE_PROVIDER_SITE_OTHER): Payer: PPO | Admitting: Physician Assistant

## 2019-07-05 VITALS — BP 108/60 | HR 120 | Temp 97.2°F | Ht 66.5 in | Wt 90.0 lb

## 2019-07-05 DIAGNOSIS — Z800 Family history of malignant neoplasm of digestive organs: Secondary | ICD-10-CM

## 2019-07-05 DIAGNOSIS — K5909 Other constipation: Secondary | ICD-10-CM | POA: Diagnosis not present

## 2019-07-05 DIAGNOSIS — K5641 Fecal impaction: Secondary | ICD-10-CM

## 2019-07-05 NOTE — Progress Notes (Signed)
Subjective:    Patient ID: [name] [name], female    DOB: 05/10/1940, 79 y.o.   MRN: [id number]  HPI [name] [name] is a pleasant 79 year old white female, established with Dr. Myrtie Neither.  She was last seen here in 2019 with constipation.  She comes in today after recent ER visit with fecal impaction. Patient is a poor historian, she has history of hypertension, atrial fib/flutter, hyperlipidemia, anxiety, diverticular disease, he had lysis of adhesions in 2018, and has congestive heart failure with EF of 30 to 35% and mitral valve disease. Last had colonoscopy in 2013 with finding of moderate diverticulosis and small internal hemorrhoids.  She was to follow-up in 5 years due to family history of colon cancer in her father. Patient says she had been off of daily MiraLAX and Benefiber for short period of time prior to this most recent ER visit on 06/23/2019, had become constipated and started having pain in her rectum which took her to the emergency room.  She was disimpacted, and also had CT of the abdomen and pelvis which showed some inflammatory changes and thickening of the rectosigmoid colon consistent with a proctocolitis, there was sigmoid diverticulosis, no mass or obstruction noted.  She was treated with a short course of antibiotics, and asked to resume MiraLAX once or twice daily. She is back to having regular bowel movements on MiraLAX daily, she says at times the stool is almost too loose, and "just comes out" as it did this morning.  She thinks this may have been due to Benefiber.  She denies any current rectal pain and has not noticed any bleeding.  She has had some mild lower abdominal discomfort.  Review of Systems Pertinent positive and negative review of systems were noted in the above HPI section.  All other review of systems was otherwise negative.  Outpatient Encounter Medications as of 07/05/2019  Medication Sig  . ALPRAZolam (XANAX) 0.25 MG tablet Take 0.5-1 tablets (0.125-0.25 mg  total) by mouth 2 (two) times daily as needed for anxiety. (Patient taking differently: Take 0.25 mg by mouth at bedtime. )  . cholecalciferol (VITAMIN D) 1000 units tablet Take 1,000 Units by mouth daily.   . fluticasone (FLONASE) 50 MCG/ACT nasal spray Place 1 spray into both nostrils daily as needed for allergies.   [name] losartan (COZAAR) 25 MG tablet Take 1 tablet (25 mg total) by mouth daily.  . Menthol, Topical Analgesic, (ICY HOT EX) Apply 1 application topically daily as needed (back pain).  . methimazole (TAPAZOLE) 5 MG tablet Take 5 mg by mouth every morning.   . metoprolol tartrate 37.5 MG TABS Take 37.5 mg by mouth 2 (two) times daily.  . Multiple Vitamin (MULTIVITAMIN WITH MINERALS) TABS tablet Take 1 tablet by mouth daily.  . pantoprazole (PROTONIX) 40 MG tablet Take 40 mg by mouth daily.   . polyethylene glycol (MIRALAX) 17 g packet Take 17 g by mouth daily.  . Rivaroxaban (XARELTO) 15 MG TABS tablet Take 1 tablet (15 mg total) by mouth daily with supper.  . sotalol (BETAPACE) 80 MG tablet Take 0.5 tablets (40 mg total) by mouth 2 (two) times daily.  . [DISCONTINUED] senna-docusate (SENOKOT-S) 8.6-50 MG tablet Take 1 tablet by mouth 2 (two) times daily.  . [DISCONTINUED] amoxicillin-clavulanate (AUGMENTIN) 875-125 MG tablet Take 1 tablet by mouth every 12 (twelve) hours.   No facility-administered encounter medications on file as of 07/05/2019.   Allergies  Allergen Reactions  . Amiodarone Other (See Comments)    Hyperthyroidism   .  Diltiazem Other (See Comments)    Put into hospital for 3 days  . Macrobid [Nitrofurantoin Monohyd Macro] Nausea Only  . Mexiletine     Confusion, pain in the face  . Aspirin Rash    Tolerates low dose aspirin  . Naproxen Sodium Swelling and Rash  . Sulfa Drugs Cross Reactors Other (See Comments)    Allergy per mother - unknown reaction   Patient Active Problem List   Diagnosis Date Noted  . Protein-calorie malnutrition, severe 04/24/2019  .  Hypokalemia 04/20/2019  . Hypomagnesemia 04/20/2019  . Anemia 04/20/2019  . Patient is Jehovah's Witness 04/20/2019  . Acquired thrombophilia (HCC) 04/05/2019  . Malnutrition of moderate degree 05/17/2016  . Bigeminy   . Intractable vomiting 05/15/2016  . Heart palpitations 05/15/2016  . SBO (small bowel obstruction) (HCC) 05/15/2016  . Acute coronary syndrome (HCC) 07/15/2015  . Status post Maze operation for atrial fibrillation 01/08/2013  . S/P MVR (mitral valve repair) 01/08/2013  . Atrial flutter (HCC) 10/30/2012  . Amaurosis fugax 10/30/2012  . Dyspnea 10/28/2012  . 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 Thyroid goiter 08/21/2012  . Microscopic hematuria 08/20/2012  . S/P mitral valve repair 06/28/2012  . History of mitral valve prolapse with severe mitral regurgtation 05/19/2012  . Refusal of blood transfusions as patient is Jehovah's Witness 05/19/2012  . Atrial fibrillation (HCC) 05/16/2012  . Anxiety 04/27/2011  . Hypertension 04/26/2011  . Diverticulitis 04/24/2011  . Hyperlipidemia    Social History   Socioeconomic History  . Marital status: Divorced    Spouse name: Not on file  . Number of children: Not on file  . Years of education: Not on file  . Highest education level: Not on file  Occupational History  . Not on file  Tobacco Use  . Smoking status: Never Smoker  . Smokeless tobacco: Never Used  Substance and Sexual Activity  . Alcohol use: No    Comment: 1 drink a month  . Drug use: No  . Sexual activity: Never  Other Topics Concern  . Not on file  Social History Narrative   Divorced - lives alone - remains functionally independent.  Has 3 children.   Social Determinants of Health   Financial Resource Strain:   . Difficulty of Paying Living Expenses:   Food Insecurity:   . Worried About Programme researcher, broadcasting/film/video in the Last Year:   . Barista in the Last Year:   Transportation Needs:   . Freight forwarder (Medical):   [name] Lack  of Transportation (Non-Medical):   Physical Activity:   . Days of Exercise per Week:   . Minutes of Exercise per Session:   Stress:   . Feeling of Stress :   Social Connections:   . Frequency of Communication with Friends and Family:   . Frequency of Social Gatherings with Friends and Family:   . Attends Religious Services:   . Active Member of Clubs or Organizations:   . Attends Banker Meetings:   [name] Marital Status:   Intimate Partner Violence:   . Fear of Current or Ex-Partner:   . Emotionally Abused:   [name] Physically Abused:   . Sexually Abused:     Ms. Kanzler family history includes Colon cancer in her father.      Objective:    Vitals:   07/05/19 1427  BP: 108/60  Pulse: (!) 120  Temp: (!) 97.2 F (36.2 C)    Physical Exam Well-developed chronically ill-appearing  thin elderly white female, in a wheelchair.  In no acute distress.  Height, Weight 90 pounds, BMI 14.3  HEENT; nontraumatic normocephalic, EOMI, PE RR LA, sclera anicteric. Oropharynx; not examined/mask/Covid Neck; supple, no JVD Cardiovascular; regular rate and rhythm with S1-S2, no murmur rub or gallop Pulmonary; Clear bilaterally Abdomen; soft, she has some mild tenderness in the suprapubic area ,nondistended, no palpable mass or hepatosplenomegaly, bowel sounds are active Rectal; not done today Skin; benign exam, no jaundice rash or appreciable lesions Extremities; no clubbing cyanosis or edema skin warm and dry Neuro/Psych; alert and oriented x4, grossly nonfocal mood and affect appropriate      Assessment & Plan:   #58 79 year old white female with multiple comorbidities, very frail-appearing and in a wheelchair today though she says she ambulates at home.  Recent ER visit with a fecal impaction and complaints of rectal pain.  Work-up with CT scan showed some inflammatory changes of the rectosigmoid consistent with a proctocolitis.  Patient has history of chronic constipation and  had been off of her daily MiraLAX when this most recent episode occurred. Not certain that she actually has an underlying proctocolitis or whether these changes were secondary to associated fecal impaction.  #2 family history of colon cancer-overdue for follow-up colonoscopy #3 diverticulosis 4.  History of small bowel obstruction status post lysis of adhesions 2018 #5 atrial fib flutter-on chronic anticoagulation #6 left ventricular diastolic dysfunction with EF 30 to 35% as of December 2020  Plan; continue MiraLAX 17 g in 8 ounces of water every day, she can use Benefiber as needed for constipation. She is overdue for follow-up colonoscopy, however poor candidate for colonoscopy due to her overall health status, frailty and low EF.  She is not certain that she could take a bowel prep at home currently.  We will plan to see her back in about a month, reassess regarding bowel function/rectal pain etc., and decide if appropriate to proceed with colonoscopy which would need to be scheduled at the hospital, and off Xarelto.  [name] [name] Genia Harold PA-C 07/05/2019   Cc: Burnard Bunting, MD

## 2019-07-05 NOTE — Progress Notes (Signed)
____________________________________________________________  Attending physician addendum:  Thank you for sending this case to me. I have reviewed the entire note, and the outlined plan seems appropriate.  I agree she sounds like a poor candidate for elective colonoscopy.  Will reevaluate at follow-up.  Amada Jupiter, MD  ____________________________________________________________

## 2019-07-05 NOTE — Patient Instructions (Signed)
Continue Miralax 17 gram dissolved in water/juice daily.   Benefiber as needed for constipation.  Continue Protonix 40mg  -once daily.   If you are age 78 or older, your body mass index should be between 23-30. Your Body mass index is 14.31 kg/m. If this is out of the aforementioned range listed, please consider follow up with your Primary Care Provider.  Keep your follow-up with Dr.Danis  Thank you for choosing me and [facility].  Amy Esterwood-PA

## 2019-07-07 DIAGNOSIS — R000 Tachycardia, unspecified: Secondary | ICD-10-CM | POA: Diagnosis not present

## 2019-07-12 ENCOUNTER — Encounter: Payer: Self-pay | Admitting: Cardiology

## 2019-07-12 NOTE — Telephone Encounter (Signed)
error 

## 2019-08-06 ENCOUNTER — Ambulatory Visit: Payer: PPO | Admitting: Gastroenterology

## 2019-08-16 ENCOUNTER — Ambulatory Visit: Payer: PPO | Admitting: Physician Assistant

## 2019-08-22 ENCOUNTER — Ambulatory Visit: Payer: PPO | Admitting: Physician Assistant

## 2019-08-22 NOTE — Telephone Encounter (Signed)
Patient did not call back.   

## 2019-08-28 ENCOUNTER — Telehealth: Payer: Self-pay | Admitting: Cardiology

## 2019-08-28 NOTE — Telephone Encounter (Signed)
lmtcb

## 2019-08-28 NOTE — Telephone Encounter (Signed)
STAT if HR is under 50 or over 120 (normal HR is 60-100 beats per minute)  1) What is your heart rate? Between 93-90   2) Do you have a log of your heart rate readings (document readings)? Ranging from 93-90 every day for 3 days  3) Do you have any other symptoms? No  Patient states her HR has been high for the last 3 days. She states she does not have any other symptoms. She states her BP cuff broke, but will try to get a new one today.

## 2019-08-31 ENCOUNTER — Telehealth: Payer: Self-pay | Admitting: Cardiology

## 2019-08-31 NOTE — Telephone Encounter (Addendum)
lmtcb  (per Dr. Elberta Fortis:  EKG, increase Sotalol to 80 mg BID, [facility] appt)

## 2019-08-31 NOTE — Telephone Encounter (Signed)
Spoke with pt who states she went into Afib last night.  Pt states she took an extra Sotalol and feels she is now back in regular rhythm but is concerned that she will not stay in regular rhythm over the weekend.  Pt denies CP, SOB or dizziness at this time.  Advised pt if she develops any of the above symptoms she will need to go to the ED for further evaluation.  Will forward information to Dr Elberta Fortis and his RN for review and recommendation.    Pt verbalizes understanding and agrees with current plan

## 2019-08-31 NOTE — Telephone Encounter (Signed)
New Message  Patient c/o Palpitations:  High priority if patient c/o lightheadedness, shortness of breath, or chest pain  1) How long have you had palpitations/irregular HR/ Afib? Are you having the symptoms now? A Week  2) Are you currently experiencing lightheadedness, SOB or CP? Really tired  3) Do you have a history of afib (atrial fibrillation) or irregular heart rhythm? Yes  4) Have you checked your BP or HR? (document readings if available):   5) Are you experiencing any other symptoms? fatigue

## 2019-09-04 MED ORDER — SOTALOL HCL 80 MG PO TABS: 80.0000 mg | ORAL_TABLET | Freq: Two times a day (BID) | ORAL | 3 refills | Status: DC

## 2019-09-04 NOTE — Telephone Encounter (Signed)
Called and left message for patient to call [facility] to schedule f/u with Jorja Loa, PA.

## 2019-09-04 NOTE — Telephone Encounter (Signed)
Reports continued elevated HRs. Pt advised to increase Sotalol to 80 mg BID, per Dr. Elberta Fortis.  Pt agreeable to plan and reports that she has felt fine when she has taken the "extra" dose.  Pt aware [facility] will call her to arrange f/u visit in week/two after Sotalol increase. Patient verbalized understanding and agreeable to plan.   Forwarding note to [facility] to arrange OV.

## 2019-09-05 NOTE — Telephone Encounter (Signed)
Called and left message for pt to call [facility] for appt.

## 2019-09-10 NOTE — Telephone Encounter (Signed)
Left message on mobile (priority) and home phone for patient to call back.  Need to schedule f/u to Sotatlol increase per Sherri @[facility]

## 2019-09-14 NOTE — Telephone Encounter (Signed)
Follow up    STAT if HR is under 50 or over 120 (normal HR is 60-100 beats per minute)  1) What is your heart rate? 64  2) Do you have a log of your heart rate readings (document readings)? No   Do you have any other symptoms? B/p is elevated, tired

## 2019-09-14 NOTE — Telephone Encounter (Signed)
Pt reports she "has no energy and feels tired all the time" Pt informed that office has tried to contact her to arrange OV that was recommended - she replies, " I should have called that girl back".  She thought her "dtr would go with her but she has other personal things to do and  just haven't called [facility] back". BP this morning 126/62, HR 64.   Educated pt to why [facility] OV was recommended. Informed that I will have them call her today and scheduled to be seen next week. Pt agreeable to plan.

## 2019-09-17 ENCOUNTER — Encounter (HOSPITAL_COMMUNITY): Payer: Self-pay | Admitting: Physician Assistant

## 2019-09-17 ENCOUNTER — Other Ambulatory Visit: Payer: Self-pay

## 2019-09-17 ENCOUNTER — Ambulatory Visit (HOSPITAL_COMMUNITY)
Admission: RE | Admit: 2019-09-17 | Discharge: 2019-09-17 | Disposition: A | Discharge status: Home / Self Care | Payer: PPO | Source: Ambulatory Visit | Attending: Physician Assistant | Admitting: Physician Assistant

## 2019-09-17 VITALS — BP 122/52 | HR 62 | Ht 66.5 in | Wt 92.6 lb

## 2019-09-17 DIAGNOSIS — I5022 Chronic systolic (congestive) heart failure: Secondary | ICD-10-CM | POA: Insufficient documentation

## 2019-09-17 DIAGNOSIS — D6869 Other thrombophilia: Secondary | ICD-10-CM | POA: Diagnosis not present

## 2019-09-17 DIAGNOSIS — I110 Hypertensive heart disease with heart failure: Secondary | ICD-10-CM | POA: Diagnosis not present

## 2019-09-17 DIAGNOSIS — I2510 Atherosclerotic heart disease of native coronary artery without angina pectoris: Secondary | ICD-10-CM | POA: Diagnosis not present

## 2019-09-17 DIAGNOSIS — F419 Anxiety disorder, unspecified: Secondary | ICD-10-CM | POA: Insufficient documentation

## 2019-09-17 DIAGNOSIS — Z7901 Long term (current) use of anticoagulants: Secondary | ICD-10-CM | POA: Diagnosis not present

## 2019-09-17 DIAGNOSIS — Z79899 Other long term (current) drug therapy: Secondary | ICD-10-CM | POA: Insufficient documentation

## 2019-09-17 DIAGNOSIS — E785 Hyperlipidemia, unspecified: Secondary | ICD-10-CM | POA: Insufficient documentation

## 2019-09-17 DIAGNOSIS — I493 Ventricular premature depolarization: Secondary | ICD-10-CM | POA: Insufficient documentation

## 2019-09-17 DIAGNOSIS — I480 Paroxysmal atrial fibrillation: Secondary | ICD-10-CM | POA: Insufficient documentation

## 2019-09-17 DIAGNOSIS — K219 Gastro-esophageal reflux disease without esophagitis: Secondary | ICD-10-CM | POA: Diagnosis not present

## 2019-09-17 DIAGNOSIS — Z951 Presence of aortocoronary bypass graft: Secondary | ICD-10-CM | POA: Diagnosis not present

## 2019-09-17 LAB — BASIC METABOLIC PANEL
Anion gap: 7 (ref 5–15)
BUN: 13 mg/dL (ref 8–23)
CO2: 27 mmol/L (ref 22–32)
Calcium: 9.7 mg/dL (ref 8.9–10.3)
Chloride: 102 mmol/L (ref 98–111)
Creatinine, Ser: 0.72 mg/dL (ref 0.44–1.00)
GFR calc Af Amer: >60 mL/min (ref >60)
GFR calc non Af Amer: >60 mL/min (ref >60)
Glucose, Bld: 116 mg/dL — ABNORMAL HIGH (ref 70–99)
Potassium: 4.5 mmol/L (ref 3.5–5.1)
Sodium: 136 mmol/L (ref 135–145)

## 2019-09-17 LAB — MAGNESIUM: Magnesium: 2 mg/dL (ref 1.7–2.4)

## 2019-09-17 NOTE — Progress Notes (Signed)
Primary Care Physician: Burnard Bunting, MD Primary Cardiologist: Dr Harrington Challenger Primary Electrophysiologist: Dr Curt Bears Referring Physician: Dr Clarice Pole is a 79 y.o. female with a history of hyperlipidemia, hypertension, mitral valve repair and left atrial appendage clipping with maze in 2014 who presents for follow up in the [facility]. She has a history of amiodarone induced thyroiditis. She had an echo performed February 2019 with severe LV systolic dysfunction. June 2017 showed no LV systolic dysfunction. She was having 22% PVCs which were monomorphic. She was felt to be a high risk for PVC ablation and she was started on sotalol. She is on Xarelto for a CHADS2VASC score of 5.   On follow up today, patient reports that she is doing "as well as can be given the circumstances." She is in SR today. Her fatigue is no better or worse. She states that she completed the Zio patch but results are pending.   Today, she denies symptoms of palpitations, chest pain, shortness of breath, orthopnea, PND, lower extremity edema, dizziness, presyncope, syncope, snoring, daytime somnolence, bleeding, or neurologic sequela. The patient is tolerating medications without difficulties and is otherwise without complaint today.    Atrial Fibrillation Risk Factors:  she does not have symptoms or diagnosis of sleep apnea.   she has a BMI of Body mass index is 14.72 kg/m.[name] Filed Weights   09/17/19 1517  Weight: 42 kg    Family History  Problem Relation Age of Onset  . Colon cancer Father      Atrial Fibrillation Management history:  Previous antiarrhythmic drugs: amiodarone, sotalol Previous cardioversions: 06/2018 Previous ablations: MAZE 2014 CHADS2VASC score: 5 Anticoagulation history: Xarelto   Past Medical History:  Diagnosis Date  . Acute coronary syndrome ([place]) 06/2015  . 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 Anemia   . Anxiety    . Aortic stenosis 05/19/2012   trivial by TEE  . Atrial fibrillation ([place])    a. Dx 04/2012;  b. Amio d/c'd 2/2 hyperthyroidism;  c. Atrial flutter 10/2012;  d. On xarelto.  [name]   . CHF (congestive heart failure) ([place])   . Complication of anesthesia   . Coronary artery disease   . Diverticulitis   . GERD (gastroesophageal reflux disease)   . Goiter   . Headache   . Heart murmur   . Hyperlipidemia    recently- taken off Crestor- due to pain in her legs, but this was after cardiac cath. , pt. questioning whether the pain in her legs was related to cath. or crestor  . Hypertension    Then hypotension 04/2012 limiting med adjustment.  . Intractable vomiting   . Malnutrition of moderate degree ([place])   . Microscopic hematuria 08/20/2012  . Mitral valve prolapse    a. H/o MVP, with severe MR 04/2012, had MV repair and Maze  . Pneumonia    treated /w antibiotic- [facility]- early Feb. 2014  . PONV (postoperative nausea and vomiting)   . Refusal of blood transfusions as patient is Jehovah's Witness   . S/P Maze operation for atrial fibrillation 06/28/2012   Complete bilateral atrial lesion set using cryothermy ablation with oversewing of LA appendage via right minithoracotomy  . S/P mitral valve repair 06/28/2012   Complex valvuloplasty including quadrangular resection of posterior leaflet, sliding leaflet plasty, artificial Gore-tex neocord placement x2 and 42mm Sorin Memo 3D ring annuloplasty via right mini thoracotomy   . SBO (small bowel obstruction) ([place])   .  Shortness of breath    Past Surgical History:  Procedure Laterality Date  . CARDIAC CATHETERIZATION N/A 07/16/2015   Procedure: Left Heart Cath and Coronary Angiography;  Surgeon: Orpah Cobb, MD;  [facility] LAB;  Service: Cardiovascular;  Laterality: N/A;  . CARDIOVERSION N/A 07/04/2018   Procedure: CARDIOVERSION;  Surgeon: [name], MD;  Location: [facility] ENDOSCOPY;  Service: Cardiovascular;  Laterality: N/A;  .  CORONARY ARTERY BYPASS GRAFT    . INTRAOPERATIVE TRANSESOPHAGEAL ECHOCARDIOGRAM N/A 06/28/2012   Procedure: INTRAOPERATIVE TRANSESOPHAGEAL ECHOCARDIOGRAM;  Surgeon: Purcell Nails, MD;  Location: [facility] OR;  Service: Open Heart Surgery;  Laterality: N/A;  . LAPAROSCOPY N/A 05/21/2016   Procedure: LAPAROSCOPY DIAGNOSTIC, LYSIS OF ADHESION BAND;  Surgeon: Axel Filler, MD;  [facility] OR;  Service: General;  Laterality: N/A;  . LEFT AND RIGHT HEART CATHETERIZATION WITH CORONARY ANGIOGRAM N/A 05/22/2012   Procedure: LEFT AND RIGHT HEART CATHETERIZATION WITH CORONARY ANGIOGRAM;  Surgeon: Kathleene Hazel, MD;  Location: [facility] CATH LAB;  Service: Cardiovascular;  Laterality: N/A;  . MINIMALLY INVASIVE MAZE PROCEDURE N/A 06/28/2012   Procedure: MINIMALLY INVASIVE MAZE PROCEDURE;  Surgeon: Purcell Nails, MD;  [facility] OR;  Service: Open Heart Surgery;  Laterality: N/A;  . MITRAL VALVE REPAIR Right 06/28/2012   Procedure: MINIMALLY INVASIVE MITRAL VALVE REPAIR (MVR);  Surgeon: Purcell Nails, MD;  Location: [facility] OR;  Service: Open Heart Surgery;  Laterality: Right;  . OVARIAN CYST SURGERY  1988  . TEE WITHOUT CARDIOVERSION  05/19/2012   Procedure: TRANSESOPHAGEAL ECHOCARDIOGRAM (TEE);  Surgeon: Dolores Patty, MD;  Location: [facility] ENDOSCOPY;  Service: Cardiovascular;  Laterality: N/A;  . TUBAL LIGATION  1974  . URETHRAL STRICTURE DILATATION    . UTERINE FIBROID SURGERY  1988  . `      Current Outpatient Medications  Medication Sig Dispense Refill  . ALPRAZolam (XANAX) 0.25 MG tablet Take 0.5-1 tablets (0.125-0.25 mg total) by mouth 2 (two) times daily as needed for anxiety. 30 tablet 0  . cholecalciferol (VITAMIN D) 1000 units tablet Take 1,000 Units by mouth daily.     . fluticasone (FLONASE) 50 MCG/ACT nasal spray Place 1 spray into both nostrils daily as needed for allergies.     [name] losartan (COZAAR) 25 MG tablet Take 1 tablet (25 mg total) by mouth daily.    . Menthol, Topical Analgesic, (ICY  HOT EX) Apply 1 application topically daily as needed (back pain).    . methimazole (TAPAZOLE) 5 MG tablet Take 5 mg by mouth every morning.     . metoprolol tartrate 37.5 MG TABS Take 37.5 mg by mouth 2 (two) times daily.    . Multiple Vitamin (MULTIVITAMIN WITH MINERALS) TABS tablet Take 1 tablet by mouth daily.    . pantoprazole (PROTONIX) 40 MG tablet Take 40 mg by mouth daily.     . polyethylene glycol (MIRALAX) 17 g packet Take 17 g by mouth daily. 14 each 0  . Rivaroxaban (XARELTO) 15 MG TABS tablet Take 1 tablet (15 mg total) by mouth daily with supper. 90 tablet 1  . sotalol (BETAPACE) 80 MG tablet Take 1 tablet (80 mg total) by mouth 2 (two) times daily. 60 tablet 3  . traMADol (ULTRAM) 50 MG tablet Take 50 mg by mouth 3 (three) times daily as needed.     No current facility-administered medications for this encounter.    Allergies  Allergen Reactions  . Amiodarone Other (See Comments)    Hyperthyroidism   . Diltiazem  Other (See Comments)    Put into hospital for 3 days  . Macrobid [Nitrofurantoin Monohyd Macro] Nausea Only  . Mexiletine     Confusion, pain in the face  . Aspirin Rash    Tolerates low dose aspirin  . Naproxen Sodium Swelling and Rash  . Sulfa Drugs Cross Reactors Other (See Comments)    Allergy per mother - unknown reaction    Social History   Socioeconomic History  . Marital status: Divorced    Spouse name: Not on file  . Number of children: Not on file  . Years of education: Not on file  . Highest education level: Not on file  Occupational History  . Not on file  Tobacco Use  . Smoking status: Never Smoker  . Smokeless tobacco: Never Used  Substance and Sexual Activity  . Alcohol use: No    Comment: 1 drink a month  . Drug use: No  . Sexual activity: Never  Other Topics Concern  . Not on file  Social History Narrative   Divorced - lives alone - remains functionally independent.  Has 3 children.   Social Determinants of Health    Financial Resource Strain:   . Difficulty of Paying Living Expenses:   Food Insecurity:   . Worried About Programme researcher, broadcasting/film/video in the Last Year:   . Barista in the Last Year:   Transportation Needs:   . Freight forwarder (Medical):   [name] Lack of Transportation (Non-Medical):   Physical Activity:   . Days of Exercise per Week:   . Minutes of Exercise per Session:   Stress:   . Feeling of Stress :   Social Connections:   . Frequency of Communication with Friends and Family:   . Frequency of Social Gatherings with Friends and Family:   . Attends Religious Services:   . Active Member of Clubs or Organizations:   . Attends Banker Meetings:   [name] Marital Status:   Intimate Partner Violence:   . Fear of Current or Ex-Partner:   . Emotionally Abused:   [name] Physically Abused:   . Sexually Abused:      ROS- All systems are reviewed and negative except as per the HPI above.  Physical Exam: Vitals:   09/17/19 1517  BP: (!) 122/52  Pulse: 62  Weight: 42 kg  Height: 5' 6.5" (1.689 m)    GEN- The patient is cachectic appearing, alert and oriented x 3 today.   HEENT-head normocephalic, atraumatic, sclera clear, conjunctiva pink, hearing intact, trachea midline. Lungs- Clear to ausculation bilaterally, normal work of breathing Heart- Regular rate and rhythm, no murmurs, rubs or gallops  GI- soft, NT, ND, + BS Extremities- no clubbing, cyanosis, or edema MS- no significant deformity or atrophy Skin- no rash or lesion Psych- euthymic mood, full affect Neuro- strength and sensation are intact   Wt Readings from Last 3 Encounters:  09/17/19 42 kg  07/05/19 40.8 kg  06/23/19 40.8 kg    EKG today demonstrates SR HR 62, LAFB, NST, PR 194, QRS 86, QTc 460  Echo 04/21/19 demonstrated  1. Left ventricular ejection fraction, by visual estimation, is 30 to  35%. The left ventricle has severely decreased function. There is no left  ventricular hypertrophy.  2.  Left ventricular diastolic parameters are consistent with Grade II  diastolic dysfunction (pseudonormalization).  3. The left ventricle demonstrates global hypokinesis.  4. Global right ventricle has normal systolic function.The right  ventricular size  is normal. No increase in right ventricular wall  thickness.  5. Left atrial size was normal.  6. Right atrial size was severely dilated.  7. The mitral valve is normal in structure. No evidence of mitral valve  regurgitation. No evidence of mitral stenosis.  8. MV repair, mean gradient .  9. The tricuspid valve is normal in structure.  10. Aortic valve regurgitation is moderate.  11. The aortic valve is tricuspid. Aortic valve regurgitation is moderate.  No evidence of aortic valve sclerosis or stenosis.  12. The pulmonic valve was normal in structure. Pulmonic valve  regurgitation is not visualized.  13. The inferior vena cava is normal in size with greater than 50%  respiratory variability, suggesting right atrial pressure of 3 mmHg.   Epic records are reviewed at length today  CHA2DS2-VASc Score = 5  The patient's score is based upon: CHF History: 1 HTN History: 1 Age : 2 Diabetes History: 0 Stroke History: 0 Vascular Disease History: 0 Gender: 1      ASSESSMENT AND PLAN: 1. Paroxysmal Atrial Fibrillation (ICD10:  I48.0) The patient's CHA2DS2-VASc score is 5, indicating a 7.2% annual risk of stroke. Patient is in SR today. Continue Xarelto to 15 mg daily Continue sotalol 80 mg BID. QT stable. Check bmet/mag today. Continue Lopressor 37.5 mg BID  2. Secondary Hypercoagulable State (ICD10:  D68.69) The patient is at significant risk for stroke/thromboembolism based upon her CHA2DS2-VASc Score of 5.  Continue Rivaroxaban (Xarelto).   3. Chronic systolic CHF NICM ? Related to PVCs vs afib. Zio patch pending. No signs or symptoms of fluid overload today.  4. HTN Stable, no changes today.  5. PVCs  Burden ~20% Zio patch pending. Followed by Dr Elberta Fortis.   Follow up with Dr Elberta Fortis in one month.   Jorja Loa PA-C [facility] [address] [place], [place] [address] [phone] 09/17/2019 4:24 PM

## 2019-09-18 ENCOUNTER — Ambulatory Visit: Payer: PPO | Admitting: Physician Assistant

## 2019-09-19 ENCOUNTER — Ambulatory Visit: Payer: PPO | Admitting: Physician Assistant

## 2019-10-18 ENCOUNTER — Ambulatory Visit: Payer: PPO | Admitting: Physician Assistant

## 2019-10-18 DIAGNOSIS — E7849 Other hyperlipidemia: Secondary | ICD-10-CM | POA: Diagnosis not present

## 2019-10-18 DIAGNOSIS — E0590 Thyrotoxicosis, unspecified without thyrotoxic crisis or storm: Secondary | ICD-10-CM | POA: Diagnosis not present

## 2019-10-22 DIAGNOSIS — I480 Paroxysmal atrial fibrillation: Secondary | ICD-10-CM | POA: Diagnosis not present

## 2019-10-22 DIAGNOSIS — E785 Hyperlipidemia, unspecified: Secondary | ICD-10-CM | POA: Diagnosis not present

## 2019-10-22 DIAGNOSIS — F1320 Sedative, hypnotic or anxiolytic dependence, uncomplicated: Secondary | ICD-10-CM | POA: Diagnosis not present

## 2019-10-22 DIAGNOSIS — I110 Hypertensive heart disease with heart failure: Secondary | ICD-10-CM | POA: Diagnosis not present

## 2019-10-22 DIAGNOSIS — R82998 Other abnormal findings in urine: Secondary | ICD-10-CM | POA: Diagnosis not present

## 2019-10-22 DIAGNOSIS — M1990 Unspecified osteoarthritis, unspecified site: Secondary | ICD-10-CM | POA: Diagnosis not present

## 2019-10-22 DIAGNOSIS — E042 Nontoxic multinodular goiter: Secondary | ICD-10-CM | POA: Diagnosis not present

## 2019-10-22 DIAGNOSIS — Z0000 Encounter for general adult medical examination without abnormal findings: Secondary | ICD-10-CM | POA: Diagnosis not present

## 2019-10-22 DIAGNOSIS — K219 Gastro-esophageal reflux disease without esophagitis: Secondary | ICD-10-CM | POA: Diagnosis not present

## 2019-10-22 DIAGNOSIS — M810 Age-related osteoporosis without current pathological fracture: Secondary | ICD-10-CM | POA: Diagnosis not present

## 2019-10-22 DIAGNOSIS — M545 Low back pain: Secondary | ICD-10-CM | POA: Diagnosis not present

## 2019-10-22 DIAGNOSIS — I5020 Unspecified systolic (congestive) heart failure: Secondary | ICD-10-CM | POA: Diagnosis not present

## 2019-10-22 DIAGNOSIS — I341 Nonrheumatic mitral (valve) prolapse: Secondary | ICD-10-CM | POA: Diagnosis not present

## 2019-10-25 ENCOUNTER — Ambulatory Visit: Payer: PPO | Admitting: Cardiology

## 2019-10-25 NOTE — Progress Notes (Deleted)
Electrophysiology Office Note   Date:  10/25/2019   ID:  [name] [name], DOB 10/23/1940, MRN [id number]  PCP:  Geoffry Paradise, MD  Cardiologist:  Jacinto Halim Primary Electrophysiologist:  Jakyle Petrucelli Jorja Loa, MD    No chief complaint on file.    History of Present Illness: [name] [name] is a 79 y.o. female who is being seen today for the evaluation of PVCs at the request of Yates Decamp. Presenting today for electrophysiology evaluation.  She has a history of hyperlipidemia, hypertension, mitral valve repair and left atrial appendage clipping with maze in 2014.  Coronary angiogram May 2017 was normal.  She has a history of amiodarone induced thyroiditis.  She had an echo performed February 2019 with severe LV systolic dysfunction.  June 2017 showed no LV systolic dysfunction.  She was having 22% PVCs which were monomorphic.  She was put on Coreg which improved her symptoms.  Today, denies symptoms of palpitations, chest pain, shortness of breath, orthopnea, PND, lower extremity edema, claudication, dizziness, presyncope, syncope, bleeding, or neurologic sequela. The patient is tolerating medications without difficulties. ***   Past Medical History:  Diagnosis Date  . Acute coronary syndrome (H[facility]) 06/2015  . 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 Anemia   . Anxiety   . Aortic stenosis 05/19/2012   trivial by TEE  . Atrial fibrillation ([facility])    a. Dx 04/2012;  b. Amio d/c'd 2/2 hyperthyroidism;  c. Atrial flutter 10/2012;  d. On xarelto.  [name]   . CHF (congestive heart failure) (H[facility])   . Complication of anesthesia   . Coronary artery disease   . Diverticulitis   . GERD (gastroesophageal reflux disease)   . Goiter   . Headache   . Heart murmur   . Hyperlipidemia    recently- taken off Crestor- due to pain in her legs, but this was after cardiac cath. , pt. questioning whether the pain in her legs was related to cath. or crestor  . Hypertension    Then hypotension  04/2012 limiting med adjustment.  . Intractable vomiting   . Malnutrition of moderate degree ([facility])   . Microscopic hematuria 08/20/2012  . Mitral valve prolapse    a. H/o MVP, with severe MR 04/2012, had MV repair and Maze  . Pneumonia    treated /w antibiotic- [facility]- early Feb. 2014  . PONV (postoperative nausea and vomiting)   . Refusal of blood transfusions as patient is Jehovah's Witness   . S/P Maze operation for atrial fibrillation 06/28/2012   Complete bilateral atrial lesion set using cryothermy ablation with oversewing of LA appendage via right minithoracotomy  . S/P mitral valve repair 06/28/2012   Complex valvuloplasty including quadrangular resection of posterior leaflet, sliding leaflet plasty, artificial Gore-tex neocord placement x2 and 54mm Sorin Memo 3D ring annuloplasty via right mini thoracotomy   . SBO (small bowel obstruction) (HCC)   . Shortness of breath    Past Surgical History:  Procedure Laterality Date  . CARDIAC CATHETERIZATION N/A 07/16/2015   Procedure: Left Heart Cath and Coronary Angiography;  Surgeon: Orpah Cobb, MD;  [facility] INVASIVE CV LAB;  Service: Cardiovascular;  Laterality: N/A;  . CARDIOVERSION N/A 07/04/2018   Procedure: CARDIOVERSION;  Surgeon: [name], MD;  Location: [facility] ENDOSCOPY;  Service: Cardiovascular;  Laterality: N/A;  . CORONARY ARTERY BYPASS GRAFT    . INTRAOPERATIVE TRANSESOPHAGEAL ECHOCARDIOGRAM N/A 06/28/2012   Procedure: INTRAOPERATIVE TRANSESOPHAGEAL ECHOCARDIOGRAM;  Surgeon: Purcell Nails, MD;  Location: [facility] OR;  Service: Open Heart Surgery;  Laterality: N/A;  . LAPAROSCOPY N/A 05/21/2016   Procedure: LAPAROSCOPY DIAGNOSTIC, LYSIS OF ADHESION BAND;  Surgeon: Axel Filler, MD;  Location: [facility] OR;  Service: General;  Laterality: N/A;  . LEFT AND RIGHT HEART CATHETERIZATION WITH CORONARY ANGIOGRAM N/A 05/22/2012   Procedure: LEFT AND RIGHT HEART CATHETERIZATION WITH CORONARY ANGIOGRAM;  Surgeon: Kathleene Hazel, MD;   Location: [facility] CATH LAB;  Service: Cardiovascular;  Laterality: N/A;  . MINIMALLY INVASIVE MAZE PROCEDURE N/A 06/28/2012   Procedure: MINIMALLY INVASIVE MAZE PROCEDURE;  Surgeon: Purcell Nails, MD;  Location: [facility] OR;  Service: Open Heart Surgery;  Laterality: N/A;  . MITRAL VALVE REPAIR Right 06/28/2012   Procedure: MINIMALLY INVASIVE MITRAL VALVE REPAIR (MVR);  Surgeon: Purcell Nails, MD;  Location: [facility] OR;  Service: Open Heart Surgery;  Laterality: Right;  . OVARIAN CYST SURGERY  1988  . TEE WITHOUT CARDIOVERSION  05/19/2012   Procedure: TRANSESOPHAGEAL ECHOCARDIOGRAM (TEE);  Surgeon: Dolores Patty, MD;  Location: [facility] ENDOSCOPY;  Service: Cardiovascular;  Laterality: N/A;  . TUBAL LIGATION  1974  . URETHRAL STRICTURE DILATATION    . UTERINE FIBROID SURGERY  1988  . `       Current Outpatient Medications  Medication Sig Dispense Refill  . ALPRAZolam (XANAX) 0.25 MG tablet Take 0.5-1 tablets (0.125-0.25 mg total) by mouth 2 (two) times daily as needed for anxiety. 30 tablet 0  . cholecalciferol (VITAMIN D) 1000 units tablet Take 1,000 Units by mouth daily.     . fluticasone (FLONASE) 50 MCG/ACT nasal spray Place 1 spray into both nostrils daily as needed for allergies.     [name] losartan (COZAAR) 25 MG tablet Take 1 tablet (25 mg total) by mouth daily.    . Menthol, Topical Analgesic, (ICY HOT EX) Apply 1 application topically daily as needed (back pain).    . methimazole (TAPAZOLE) 5 MG tablet Take 5 mg by mouth every morning.     . metoprolol tartrate 37.5 MG TABS Take 37.5 mg by mouth 2 (two) times daily.    . Multiple Vitamin (MULTIVITAMIN WITH MINERALS) TABS tablet Take 1 tablet by mouth daily.    . pantoprazole (PROTONIX) 40 MG tablet Take 40 mg by mouth daily.     . polyethylene glycol (MIRALAX) 17 g packet Take 17 g by mouth daily. 14 each 0  . Rivaroxaban (XARELTO) 15 MG TABS tablet Take 1 tablet (15 mg total) by mouth daily with supper. 90 tablet 1  . sotalol (BETAPACE) 80 MG tablet  Take 1 tablet (80 mg total) by mouth 2 (two) times daily. 60 tablet 3  . traMADol (ULTRAM) 50 MG tablet Take 50 mg by mouth 3 (three) times daily as needed.     No current facility-administered medications for this visit.    Allergies:   Amiodarone, Diltiazem, Macrobid [nitrofurantoin monohyd macro], Mexiletine, Aspirin, Naproxen sodium, and Sulfa drugs cross reactors   Social History:  The patient  reports that she has never smoked. She has never used smokeless tobacco. She reports that she does not drink alcohol and does not use drugs.   Family History:  The patient's family history includes Colon cancer in her father.    ROS:  Please see the history of present illness.   Otherwise, review of systems is positive for none.   All other systems are reviewed and negative.   PHYSICAL EXAM: VS:  There were no vitals taken for this visit. , BMI There is no height or weight  on file to calculate BMI. GEN: Well nourished, well developed, in no acute distress  HEENT: normal  Neck: no JVD, carotid bruits, or masses Cardiac: ***RRR; no murmurs, rubs, or gallops,no edema  Respiratory:  clear to auscultation bilaterally, normal work of breathing GI: soft, nontender, nondistended, + BS MS: no deformity or atrophy  Skin: warm and dry Neuro:  Strength and sensation are intact Psych: euthymic mood, full affect  EKG:  EKG {ACTION; IS/IS [id number]} ordered today. Personal review of the ekg ordered *** shows ***     Recent Labs: 04/20/2019: B Natriuretic Peptide 224.6; TSH 1.323 06/23/2019: ALT 28; Hemoglobin 13.2; Platelets 215 09/17/2019: BUN 13; Creatinine, Ser 0.72; Magnesium 2.0; Potassium 4.5; Sodium 136    Lipid Panel     Component Value Date/Time   CHOL 226 (H) 07/16/2015 0557   TRIG 74 07/16/2015 0557   HDL 45 07/16/2015 0557   CHOLHDL 5.0 07/16/2015 0557   VLDL 15 07/16/2015 0557   LDLCALC 166 (H) 07/16/2015 0557     Wt Readings from Last 3 Encounters:  09/17/19 92 lb 9.6  oz (42 kg)  07/05/19 90 lb (40.8 kg)  06/23/19 90 lb (40.8 kg)      Other studies Reviewed: Additional studies/ records that were reviewed today include: TTE 03/08/2018 Review of the above records today demonstrates:  Left ventricular cavity normal size.  Mild concentric LVH.  EF 40%. Severely dilated left atrium Mild aortic regurgitation Mitral annuloplasty ring in place with severe calcification.  Mild to moderate mitral stenosis.  Mild mitral regurgitation.  ASSESSMENT AND PLAN:  1.  PVCs: Has a high burden of PVCs at 20%.  Currently on sotalol.  ***  2.  Chronic systolic heart failure due to nonischemic cardiomyopathy: Currently on sotalol.  Possibly related to PVCs.  3.  Paroxysmal atrial fibrillation: Status post maze and left atrial appendage clipping.  CHA2DS2-VASc of 3.    Current medicines are reviewed at length with the patient today.   The patient does not have concerns regarding her medicines.  The following changes were made today: ***  Labs/ tests ordered today include:  No orders of the defined types were placed in this encounter.  Disposition:   FU with Kempton Milne *** months  Signed, Delynda Sepulveda Jorja Loa, MD  10/25/2019 2:04 PM     [facility] [address] [place] [address] [phone] (office) [phone] (fax)

## 2019-12-01 ENCOUNTER — Telehealth: Payer: Self-pay | Admitting: Student

## 2019-12-01 ENCOUNTER — Other Ambulatory Visit: Payer: Self-pay | Admitting: Cardiology

## 2019-12-01 MED ORDER — RIVAROXABAN 15 MG PO TABS: 15.0000 mg | ORAL_TABLET | Freq: Every day | ORAL | 2 refills | Status: DC

## 2019-12-01 NOTE — Telephone Encounter (Addendum)
   Patient called Answering Service today requesting medication refill. Called and spoke with patient. She is out of her Xarelto 15mg  daily. Took last dose last night. Will send in new prescription to [facility] [address] Patient thanked me for calling back.  Of note, she is on reduced dose of Xarelto. She has been on this dose for a long time. Renal function in 08/2019 normal but Creatinine Clearance (by Cockcroft-Gault Equation) 42.70. Will leave at current dose.  09/2019, PA-C 12/01/2019 10:49 AM

## 2019-12-03 NOTE — Telephone Encounter (Signed)
Age 79, weight 42kg, scr 0.76 on 06/23/19, crcl 40 afib indication, last visit may 2021

## 2019-12-10 ENCOUNTER — Telehealth: Payer: Self-pay | Admitting: Cardiology

## 2019-12-10 MED ORDER — SOTALOL HCL 80 MG PO TABS: 80.0000 mg | ORAL_TABLET | Freq: Two times a day (BID) | ORAL | 1 refills | Status: DC

## 2019-12-10 NOTE — Telephone Encounter (Signed)
°*  STAT* If patient is at the pharmacy, call can be transferred to refill team.   1. Which medications need to be refilled? (please list name of each medication and dose if known)   sotalol (BETAPACE) 80 MG tablet    2. Which pharmacy/location (including street and city if local pharmacy) is medication to be sent to? [id number] [place] [place] - [address]  3. Do they need a 30 day or 90 day supply? 90 day supply

## 2019-12-10 NOTE — Telephone Encounter (Signed)
Pt's medication was sent to pt's pharmacy as requested. Confirmation received.  °

## 2020-01-10 ENCOUNTER — Telehealth: Payer: Self-pay | Admitting: Cardiology

## 2020-01-10 NOTE — Telephone Encounter (Signed)
Pt states that she woke up in the middle of the night having to urinate a lot. Also says that she felt a sharp pain under left arm and felt unsteady. Battery in blood pressure pump is dead so cannot check BP. Please call to discuss.

## 2020-01-10 NOTE — Telephone Encounter (Signed)
Left message to call office

## 2020-01-14 NOTE — Telephone Encounter (Addendum)
Pt reports that she is doing better since call last week, issues resolved. Nothing further needed at this time. Pt appreciates the follow up.

## 2020-02-26 ENCOUNTER — Ambulatory Visit: Payer: PPO | Admitting: Cardiology

## 2020-02-26 NOTE — Progress Notes (Deleted)
Electrophysiology Office Note   Date:  02/26/2020   ID:  [name] [name], DOB 1940-12-15, MRN [id number]  PCP:  Geoffry Paradise, MD  Cardiologist:  Jacinto Halim Primary Electrophysiologist:  Shavone Nevers Jorja Loa, MD    No chief complaint on file.    History of Present Illness: [name] [name] is a 79 y.o. female who is being seen today for the evaluation of PVCs at the request of [name] [name]. Presenting today for electrophysiology evaluation.  She has a history of hyperlipidemia, hypertension, mitral valve repair with left atrial appendage clipping and maze in 2014.  She had coronary angiogram May 2017 which was normal.  She has a history of amiodarone induced thyroiditis.  She had an echo February 2019 with severe LV dysfunction.  Repeat echo June 2017 showed no LV systolic dysfunction.  She was found to have 22% PVC burden which was monomorphic.  She was put on carvedilol which improved her symptoms.  She presented back into clinic in atrial flutter and was started on sotalol.  Today, denies symptoms of palpitations, chest pain, shortness of breath, orthopnea, PND, lower extremity edema, claudication, dizziness, presyncope, syncope, bleeding, or neurologic sequela. The patient is tolerating medications without difficulties. ***   Past Medical History:  Diagnosis Date  . Acute coronary syndrome (HCC) 06/2015  . 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 Anemia   . Anxiety   . Aortic stenosis 05/19/2012   trivial by TEE  . Atrial fibrillation (HCC)    a. Dx 04/2012;  b. Amio d/c'd 2/2 hyperthyroidism;  c. Atrial flutter 10/2012;  d. On xarelto.  [name]   . CHF (congestive heart failure) (HCC)   . Complication of anesthesia   . Coronary artery disease   . Diverticulitis   . GERD (gastroesophageal reflux disease)   . Goiter   . Headache   . Heart murmur   . Hyperlipidemia    recently- taken off Crestor- due to pain in her legs, but this was after cardiac cath. , pt.  questioning whether the pain in her legs was related to cath. or crestor  . Hypertension    Then hypotension 04/2012 limiting med adjustment.  . Intractable vomiting   . Malnutrition of moderate degree (HCC)   . Microscopic hematuria 08/20/2012  . Mitral valve prolapse    a. H/o MVP, with severe MR 04/2012, had MV repair and Maze  . Pneumonia    treated /w antibiotic- [facility]- early Feb. 2014  . PONV (postoperative nausea and vomiting)   . Refusal of blood transfusions as patient is Jehovah's Witness   . S/P Maze operation for atrial fibrillation 06/28/2012   Complete bilateral atrial lesion set using cryothermy ablation with oversewing of LA appendage via right minithoracotomy  . S/P mitral valve repair 06/28/2012   Complex valvuloplasty including quadrangular resection of posterior leaflet, sliding leaflet plasty, artificial Gore-tex neocord placement x2 and 89mm Sorin Memo 3D ring annuloplasty via right mini thoracotomy   . SBO (small bowel obstruction) (HCC)   . Shortness of breath    Past Surgical History:  Procedure Laterality Date  . CARDIAC CATHETERIZATION N/A 07/16/2015   Procedure: Left Heart Cath and Coronary Angiography;  Surgeon: Orpah Cobb, MD;  Location: [facility] INVASIVE CV LAB;  Service: Cardiovascular;  Laterality: N/A;  . CARDIOVERSION N/A 07/04/2018   Procedure: CARDIOVERSION;  Surgeon: [name], MD;  Location: [facility] ENDOSCOPY;  Service: Cardiovascular;  Laterality: N/A;  . CORONARY ARTERY BYPASS GRAFT    . INTRAOPERATIVE  TRANSESOPHAGEAL ECHOCARDIOGRAM N/A 06/28/2012   Procedure: INTRAOPERATIVE TRANSESOPHAGEAL ECHOCARDIOGRAM;  Surgeon: Purcell Nails, MD;  Location: [facility] OR;  Service: Open Heart Surgery;  Laterality: N/A;  . LAPAROSCOPY N/A 05/21/2016   Procedure: LAPAROSCOPY DIAGNOSTIC, LYSIS OF ADHESION BAND;  Surgeon: Axel Filler, MD;  Location: [facility] OR;  Service: General;  Laterality: N/A;  . LEFT AND RIGHT HEART CATHETERIZATION WITH CORONARY ANGIOGRAM N/A 05/22/2012    Procedure: LEFT AND RIGHT HEART CATHETERIZATION WITH CORONARY ANGIOGRAM;  Surgeon: Kathleene Hazel, MD;  Location: [facility] CATH LAB;  Service: Cardiovascular;  Laterality: N/A;  . MINIMALLY INVASIVE MAZE PROCEDURE N/A 06/28/2012   Procedure: MINIMALLY INVASIVE MAZE PROCEDURE;  Surgeon: Purcell Nails, MD;  Location: [facility] OR;  Service: Open Heart Surgery;  Laterality: N/A;  . MITRAL VALVE REPAIR Right 06/28/2012   Procedure: MINIMALLY INVASIVE MITRAL VALVE REPAIR (MVR);  Surgeon: Purcell Nails, MD;  Location: [facility] OR;  Service: Open Heart Surgery;  Laterality: Right;  . OVARIAN CYST SURGERY  1988  . TEE WITHOUT CARDIOVERSION  05/19/2012   Procedure: TRANSESOPHAGEAL ECHOCARDIOGRAM (TEE);  Surgeon: Dolores Patty, MD;  Location: [facility] ENDOSCOPY;  Service: Cardiovascular;  Laterality: N/A;  . TUBAL LIGATION  1974  . URETHRAL STRICTURE DILATATION    . UTERINE FIBROID SURGERY  1988  . `       Current Outpatient Medications  Medication Sig Dispense Refill  . ALPRAZolam (XANAX) 0.25 MG tablet Take 0.5-1 tablets (0.125-0.25 mg total) by mouth 2 (two) times daily as needed for anxiety. 30 tablet 0  . cholecalciferol (VITAMIN D) 1000 units tablet Take 1,000 Units by mouth daily.     . fluticasone (FLONASE) 50 MCG/ACT nasal spray Place 1 spray into both nostrils daily as needed for allergies.     [name] losartan (COZAAR) 25 MG tablet Take 1 tablet (25 mg total) by mouth daily.    . Menthol, Topical Analgesic, (ICY HOT EX) Apply 1 application topically daily as needed (back pain).    . methimazole (TAPAZOLE) 5 MG tablet Take 5 mg by mouth every morning.     . metoprolol tartrate 37.5 MG TABS Take 37.5 mg by mouth 2 (two) times daily.    . Multiple Vitamin (MULTIVITAMIN WITH MINERALS) TABS tablet Take 1 tablet by mouth daily.    . pantoprazole (PROTONIX) 40 MG tablet Take 40 mg by mouth daily.     . polyethylene glycol (MIRALAX) 17 g packet Take 17 g by mouth daily. 14 each 0  . sotalol (BETAPACE) 80 MG tablet  Take 1 tablet (80 mg total) by mouth 2 (two) times daily. 180 tablet 1  . traMADol (ULTRAM) 50 MG tablet Take 50 mg by mouth 3 (three) times daily as needed.    Carlena Hurl 15 MG TABS tablet TAKE 1 TABLET BY MOUTH DAILY WITH SUPPER 30 tablet 5   No current facility-administered medications for this visit.    Allergies:   Amiodarone, Diltiazem, Macrobid [nitrofurantoin monohyd macro], Mexiletine, Aspirin, Naproxen sodium, and Sulfa drugs cross reactors   Social History:  The patient  reports that she has never smoked. She has never used smokeless tobacco. She reports that she does not drink alcohol and does not use drugs.   Family History:  The patient's family history includes Colon cancer in her father.   ROS:  Please see the history of present illness.   Otherwise, review of systems is positive for none.   All other systems are reviewed and negative.   PHYSICAL EXAM: VS:  There were no vitals taken for this visit. , BMI There is no height or weight on file to calculate BMI. GEN: Well nourished, well developed, in no acute distress  HEENT: normal  Neck: no JVD, carotid bruits, or masses Cardiac: ***RRR; no murmurs, rubs, or gallops,no edema  Respiratory:  clear to auscultation bilaterally, normal work of breathing GI: soft, nontender, nondistended, + BS MS: no deformity or atrophy  Skin: warm and dry Neuro:  Strength and sensation are intact Psych: euthymic mood, full affect  EKG:  EKG {ACTION; IS/IS [id number]} ordered today. Personal review of the ekg ordered *** shows ***  Recent Labs: 04/20/2019: B Natriuretic Peptide 224.6; TSH 1.323 06/23/2019: ALT 28; Hemoglobin 13.2; Platelets 215 09/17/2019: BUN 13; Creatinine, Ser 0.72; Magnesium 2.0; Potassium 4.5; Sodium 136    Lipid Panel     Component Value Date/Time   CHOL 226 (H) 07/16/2015 0557   TRIG 74 07/16/2015 0557   HDL 45 07/16/2015 0557   CHOLHDL 5.0 07/16/2015 0557   VLDL 15 07/16/2015 0557   LDLCALC 166 (H)  07/16/2015 0557     Wt Readings from Last 3 Encounters:  09/17/19 92 lb 9.6 oz (42 kg)  07/05/19 90 lb (40.8 kg)  06/23/19 90 lb (40.8 kg)      Other studies Reviewed: Additional studies/ records that were reviewed today include: TTE 03/08/2018 Review of the above records today demonstrates:  Left ventricular cavity normal size.  Mild concentric LVH.  EF 40%. Severely dilated left atrium Mild aortic regurgitation Mitral annuloplasty ring in place with severe calcification.  Mild to moderate mitral stenosis.  Mild mitral regurgitation.  ASSESSMENT AND PLAN:  1.  PVCs: Elevated burden of 4 to 20%.  Currently on sotalol.  ***  2.  Chronic systolic heart failure due to nonischemic cardiomyopathy: Possibly related to PVCs.  Currently on sotalol.  3.  Paroxysmal atrial fibrillation/flutter: Status post maze and left atrial appendage clipping.  CHA2DS2-VASc of 3.  Currently on sotalol (monitoring for high risk medication) ***   Current medicines are reviewed at length with the patient today.   The patient does not have concerns regarding her medicines.  The following changes were made today: ***  Labs/ tests ordered today include:  No orders of the defined types were placed in this encounter.  Disposition:   FU with Marcos Ruelas ** months  Signed, Jordyan Hardiman Jorja Loa, MD  02/26/2020 9:49 AM     [facility] [address] [place] [address] [phone] (office) [phone] (fax)

## 2020-04-10 ENCOUNTER — Ambulatory Visit: Payer: PPO | Admitting: Cardiology

## 2020-04-29 ENCOUNTER — Telehealth: Payer: Self-pay | Admitting: Cardiology

## 2020-04-29 NOTE — Telephone Encounter (Signed)
    Pt c/o medication issue:  1. Name of Medication:   XARELTO 15 MG TABS tablet    2. How are you currently taking this medication (dosage and times per day)? TAKE 1 TABLET BY MOUTH DAILY WITH SUPPER  3. Are you having a reaction (difficulty breathing--STAT)?   4. What is your medication issue? Pt said this medication is expensive, will cost her over $100 copay. She wanted to speak with Roanna Raider, she wanted to know if she can get affordable alternatives

## 2020-04-30 DIAGNOSIS — I10 Essential (primary) hypertension: Secondary | ICD-10-CM | POA: Diagnosis not present

## 2020-04-30 DIAGNOSIS — F419 Anxiety disorder, unspecified: Secondary | ICD-10-CM | POA: Diagnosis not present

## 2020-04-30 DIAGNOSIS — K219 Gastro-esophageal reflux disease without esophagitis: Secondary | ICD-10-CM | POA: Diagnosis not present

## 2020-04-30 DIAGNOSIS — Z79899 Other long term (current) drug therapy: Secondary | ICD-10-CM | POA: Diagnosis not present

## 2020-04-30 DIAGNOSIS — M810 Age-related osteoporosis without current pathological fracture: Secondary | ICD-10-CM | POA: Diagnosis not present

## 2020-04-30 NOTE — Telephone Encounter (Signed)
**Note De-Identified  Obfuscation** No answer so I left a message on the pts VM asking her to call Larita Fife at Dr Gershon Crane office at [phone].

## 2020-05-06 ENCOUNTER — Ambulatory Visit: Payer: PPO | Admitting: Cardiology

## 2020-05-09 NOTE — Telephone Encounter (Signed)
No answer so I left a more detailed message on the pts VM (ok per DPR) asking her if she still needs asst with the cost of her Xarelto and that if she does to please contact [employer] pt [employer]st [employer] at [phone] to ask questions about their xarelto program and that if it appears that she would be approved for the program to ask them to mail her an application.  I also advised that once she receives the application to complete her part, obtain required documents per [employer] if any, and to bring all to Dr [employer] office on [facility] to drop off and that we will handle the provider part of the application and that we will fax all to [employer] pt asst foundation.  I also left my name/office phone number in the VM and ask her to call back if she has questions or concerns.

## 2020-06-12 ENCOUNTER — Telehealth: Payer: Self-pay | Admitting: Cardiology

## 2020-06-12 NOTE — Telephone Encounter (Signed)
[name] [name] is calling to inform Dr. Elberta Fortis that she had a small nose bleed this morning around 7:30 am. She states it did not last long, but felt he should know since she is on Xarelto and Sotalol. Please advise.

## 2020-06-12 NOTE — Telephone Encounter (Signed)
Returned pt call, advised not to stop/hold blood thinner. Advised to monitor, if she begins to experience frequent nose bleeds to follow up with PCP (and let us know) Patient verbalized understanding and agreeable to plan.

## 2020-08-04 ENCOUNTER — Telehealth: Payer: Self-pay | Admitting: Cardiology

## 2020-08-04 NOTE — Telephone Encounter (Signed)
[name] [name] is asking that the patient last set of dr notes be fax over to there office [phone]

## 2020-08-04 NOTE — Telephone Encounter (Signed)
Attempted to reach staff at [facility], office closed at 4, but no way to speak to someone w/o calling on call MD. Called on call service and left message that I was trying to reach office staff

## 2020-08-04 NOTE — Telephone Encounter (Signed)
[facility] aware pt has not been seen in over a year by our office.  Last OV was the [facility] 08/2019, telemedicine with Dr. Elberta Fortis 05/2019, in person OV with Dr. Elberta Fortis was 01/2019. They do NOT need records faxed as earlier requested.

## 2020-08-06 ENCOUNTER — Telehealth: Payer: Self-pay

## 2020-08-06 NOTE — Telephone Encounter (Signed)
**Note De-Identified  Obfuscation** We received the MD page of a [name] [name] and [place] pt asst application from [place] from [facility] associates with a request fr [place] to complete it, have Dr Gershon Crane sign/date it, and to fax back to her at [phone] or [phone]. I have completed the MD page, a cover letter, and have emailed all to Dr Gershon Crane nurse so she can obtain his signature and to fax back to [facility] at fax number written on cover letter included or to place in nurses box in Medical Records to be faxed.

## 2020-08-13 NOTE — Telephone Encounter (Signed)
MD page signed and faxed. Confirmation received.

## 2020-08-23 DIAGNOSIS — I5020 Unspecified systolic (congestive) heart failure: Secondary | ICD-10-CM | POA: Diagnosis not present

## 2020-08-23 DIAGNOSIS — I110 Hypertensive heart disease with heart failure: Secondary | ICD-10-CM | POA: Diagnosis not present

## 2020-08-23 DIAGNOSIS — E785 Hyperlipidemia, unspecified: Secondary | ICD-10-CM | POA: Diagnosis not present

## 2020-08-23 DIAGNOSIS — M810 Age-related osteoporosis without current pathological fracture: Secondary | ICD-10-CM | POA: Diagnosis not present

## 2020-10-07 ENCOUNTER — Other Ambulatory Visit: Payer: Self-pay | Admitting: Student

## 2020-10-07 NOTE — Telephone Encounter (Signed)
Pt last saw Alphonzo Severance, PA on 09/17/19, pt is overdue for follow-up.  Pt was advised at OV to f/u with Dr Elberta Fortis in 1 month.  Pt has No showed and cancelled multiple f/u visits with Dr Elberta Fortis.   Pt requesting Xarelto refill, will need to schedule OV for refill.  Sent msg to scheduler to make f/u appt, once f/u appt scheduled will only give enough Xarelto to get pt to appt since they have cancelled and No Showed to multiple appts. Age 80, weight 42kg, last labs 10/18/19 Creat 0.7 per [facility] at [facility] medical, CrCl 43.92, based on CrCl pt is on appropriate dosage of Xarelto 15mg  QD.  Will need labwork at OV with [facility] as well, most recent labwork almost 80 year old.

## 2020-10-14 ENCOUNTER — Telehealth: Payer: Self-pay | Admitting: Cardiology

## 2020-10-14 MED ORDER — RIVAROXABAN 15 MG PO TABS: ORAL_TABLET | ORAL | 0 refills | Status: DC

## 2020-10-14 NOTE — Telephone Encounter (Signed)
Pt c/o medication issue:  1. Name of Medication: XARELTO 15 MG TABS tablet  2. How are you currently taking this medication (dosage and times per day)? As written  3. Are you having a reaction (difficulty breathing--STAT)? No   4. What is your medication issue? Patient is an older patient who is not able to drive. Stated that daughter has a hurt foot and that why last appt was missed. She is her only means of transportation. Patient has a new appt scheduled for 01/29/21 but needs a refill of medication because patient is currently out. Please send in 90 day refill to  [id number] - [place], [place] - [address]

## 2020-10-14 NOTE — Telephone Encounter (Signed)
Rx already sent in but was sent in as 30 day supply, will resend as 90 day supply per pt request. Pt also needs updated BMET and CBC for annual Xarelto monitoring. I have added a note to her next appt in Oct to check BMET and CBC.

## 2020-10-14 NOTE — Telephone Encounter (Signed)
Patient called scheduler and stated that she is afraid to drive and her daughter hurt her foot so she doesn't know when she will be able to come in for appointment. Patient has missed a few previous appointments as well. She needs refill on Xarelto. She scheduled an appointment in October. I will give enough medicine to get to appointment. This will be the last fill. Patient must be seen by MD.

## 2020-11-06 DIAGNOSIS — E785 Hyperlipidemia, unspecified: Secondary | ICD-10-CM | POA: Diagnosis not present

## 2020-11-06 DIAGNOSIS — M810 Age-related osteoporosis without current pathological fracture: Secondary | ICD-10-CM | POA: Diagnosis not present

## 2020-11-06 DIAGNOSIS — E042 Nontoxic multinodular goiter: Secondary | ICD-10-CM | POA: Diagnosis not present

## 2020-11-06 DIAGNOSIS — I10 Essential (primary) hypertension: Secondary | ICD-10-CM | POA: Diagnosis not present

## 2020-11-23 DIAGNOSIS — I5020 Unspecified systolic (congestive) heart failure: Secondary | ICD-10-CM | POA: Diagnosis not present

## 2020-11-23 DIAGNOSIS — M810 Age-related osteoporosis without current pathological fracture: Secondary | ICD-10-CM | POA: Diagnosis not present

## 2020-11-23 DIAGNOSIS — I110 Hypertensive heart disease with heart failure: Secondary | ICD-10-CM | POA: Diagnosis not present

## 2020-11-23 DIAGNOSIS — E785 Hyperlipidemia, unspecified: Secondary | ICD-10-CM | POA: Diagnosis not present

## 2020-12-24 DIAGNOSIS — E0590 Thyrotoxicosis, unspecified without thyrotoxic crisis or storm: Secondary | ICD-10-CM | POA: Diagnosis not present

## 2020-12-24 DIAGNOSIS — E785 Hyperlipidemia, unspecified: Secondary | ICD-10-CM | POA: Diagnosis not present

## 2020-12-24 DIAGNOSIS — Z1331 Encounter for screening for depression: Secondary | ICD-10-CM | POA: Diagnosis not present

## 2020-12-24 DIAGNOSIS — M810 Age-related osteoporosis without current pathological fracture: Secondary | ICD-10-CM | POA: Diagnosis not present

## 2020-12-24 DIAGNOSIS — I110 Hypertensive heart disease with heart failure: Secondary | ICD-10-CM | POA: Diagnosis not present

## 2020-12-24 DIAGNOSIS — I700 Atherosclerosis of aorta: Secondary | ICD-10-CM | POA: Diagnosis not present

## 2020-12-24 DIAGNOSIS — T466X5A Adverse effect of antihyperlipidemic and antiarteriosclerotic drugs, initial encounter: Secondary | ICD-10-CM | POA: Diagnosis not present

## 2020-12-24 DIAGNOSIS — R82998 Other abnormal findings in urine: Secondary | ICD-10-CM | POA: Diagnosis not present

## 2020-12-24 DIAGNOSIS — Z1339 Encounter for screening examination for other mental health and behavioral disorders: Secondary | ICD-10-CM | POA: Diagnosis not present

## 2020-12-24 DIAGNOSIS — I480 Paroxysmal atrial fibrillation: Secondary | ICD-10-CM | POA: Diagnosis not present

## 2020-12-24 DIAGNOSIS — K219 Gastro-esophageal reflux disease without esophagitis: Secondary | ICD-10-CM | POA: Diagnosis not present

## 2020-12-24 DIAGNOSIS — Z7901 Long term (current) use of anticoagulants: Secondary | ICD-10-CM | POA: Diagnosis not present

## 2020-12-24 DIAGNOSIS — I10 Essential (primary) hypertension: Secondary | ICD-10-CM | POA: Diagnosis not present

## 2020-12-24 DIAGNOSIS — Z0000 Encounter for general adult medical examination without abnormal findings: Secondary | ICD-10-CM | POA: Diagnosis not present

## 2020-12-24 DIAGNOSIS — I5020 Unspecified systolic (congestive) heart failure: Secondary | ICD-10-CM | POA: Diagnosis not present

## 2021-01-23 DIAGNOSIS — E785 Hyperlipidemia, unspecified: Secondary | ICD-10-CM | POA: Diagnosis not present

## 2021-01-23 DIAGNOSIS — I110 Hypertensive heart disease with heart failure: Secondary | ICD-10-CM | POA: Diagnosis not present

## 2021-01-23 DIAGNOSIS — M810 Age-related osteoporosis without current pathological fracture: Secondary | ICD-10-CM | POA: Diagnosis not present

## 2021-01-23 DIAGNOSIS — I5020 Unspecified systolic (congestive) heart failure: Secondary | ICD-10-CM | POA: Diagnosis not present

## 2021-01-29 ENCOUNTER — Ambulatory Visit: Payer: PPO | Admitting: Cardiology

## 2021-01-29 NOTE — Progress Notes (Deleted)
Electrophysiology Office Note   Date:  01/29/2021   ID:  [name] [name], DOB 07/09/1940, MRN [id number]  PCP:  Geoffry Paradise, MD  Cardiologist:  Jacinto Halim Primary Electrophysiologist:  Audra Bellard Jorja Loa, MD    No chief complaint on file.    History of Present Illness: [name] [name] is a 80 y.o. female who is being seen today for the evaluation of PVCs at the request of Yates Decamp. Presenting today for electrophysiology evaluation.    She has a history of severe hyperlipidemia, hypertension, mitral valve repair, left atrial appendage clipping, and maze in 2014.  Coronary angiogram May 2017 was normal.  She has a history of amiodarone induced thyroiditis.  She had an echo February 2019 that showed severe LV dysfunction.  June 2017 echo showed no LV dysfunction.  She was having 22% PVCs which were monomorphic.  She was started on carvedilol which improved her symptoms.  She is now on sotalol.  Today, denies symptoms of palpitations, chest pain, shortness of breath, orthopnea, PND, lower extremity edema, claudication, dizziness, presyncope, syncope, bleeding, or neurologic sequela. The patient is tolerating medications without difficulties. ***   Past Medical History:  Diagnosis Date   Acute coronary syndrome (HCC) 06/2015   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 Dx 04/2012;  b. Amio d/c'd 2/2 hyperthyroidism;  c. Atrial flutter 10/2012;  d. On xarelto.   Bigeminy    CHF (congestive heart failure) (HCC)    Complication of anesthesia    Coronary artery disease    Diverticulitis    GERD (gastroesophageal reflux disease)    Goiter    Headache    Heart murmur    Hyperlipidemia    recently- taken off Crestor- due to pain in her legs, but this was after cardiac cath. , pt. questioning whether the pain in her legs was related to cath. or crestor   Hypertension    Then  hypotension 04/2012 limiting med adjustment.   Intractable vomiting    Malnutrition of moderate degree (HCC)    Microscopic hematuria 08/20/2012   Mitral valve prolapse    a. H/o MVP, with severe MR 04/2012, had MV repair and Maze   Pneumonia    treated /w antibiotic- [facility]- early Feb. 2014   PONV (postoperative nausea and vomiting)    Refusal of blood transfusions as patient is Jehovah's Witness    S/P Maze operation for atrial fibrillation 06/28/2012   Complete bilateral atrial lesion set using cryothermy ablation with oversewing of LA appendage via right minithoracotomy   S/P mitral valve repair 06/28/2012   Complex valvuloplasty including quadrangular resection of posterior leaflet, sliding leaflet plasty, artificial Gore-tex neocord placement x2 and 43mm Sorin Memo 3D ring annuloplasty via right mini thoracotomy    SBO (small bowel obstruction) (HCC)    Shortness of breath    Past Surgical History:  Procedure Laterality Date   CARDIAC CATHETERIZATION N/A 07/16/2015   Procedure: Left Heart Cath and Coronary Angiography;  Surgeon: Orpah Cobb, MD;  [facility]: [facility] INVASIVE CV LAB;  Service: Cardiovascular;  Laterality: N/A;   CARDIOVERSION N/A 07/04/2018   Procedure: CARDIOVERSION;  Surgeon: Elease Hashimoto Deloris Ping, MD;  Location: [facility] ENDOSCOPY;  Service: Cardiovascular;  Laterality: N/A;   CORONARY ARTERY BYPASS GRAFT     INTRAOPERATIVE TRANSESOPHAGEAL ECHOCARDIOGRAM N/A 06/28/2012   Procedure: INTRAOPERATIVE TRANSESOPHAGEAL ECHOCARDIOGRAM;  Surgeon: Marilu Favre  Zadie Cleverly, MD;  Location: [facility] OR;  Service: Open Heart Surgery;  Laterality: N/A;   LAPAROSCOPY N/A 05/21/2016   Procedure: LAPAROSCOPY DIAGNOSTIC, LYSIS OF ADHESION BAND;  Surgeon: Axel Filler, MD;  Location: [facility] OR;  Service: General;  Laterality: N/A;   LEFT AND RIGHT HEART CATHETERIZATION WITH CORONARY ANGIOGRAM N/A 05/22/2012   Procedure: LEFT AND RIGHT HEART CATHETERIZATION WITH CORONARY ANGIOGRAM;  Surgeon: Kathleene Hazel, MD;  Location:  [facility] CATH LAB;  Service: Cardiovascular;  Laterality: N/A;   MINIMALLY INVASIVE MAZE PROCEDURE N/A 06/28/2012   Procedure: MINIMALLY INVASIVE MAZE PROCEDURE;  Surgeon: Purcell Nails, MD;  Location: [facility] OR;  Service: Open Heart Surgery;  Laterality: N/A;   MITRAL VALVE REPAIR Right 06/28/2012   Procedure: MINIMALLY INVASIVE MITRAL VALVE REPAIR (MVR);  Surgeon: Purcell Nails, MD;  Location: [facility] OR;  Service: Open Heart Surgery;  Laterality: Right;   OVARIAN CYST SURGERY  1988   TEE WITHOUT CARDIOVERSION  05/19/2012   Procedure: TRANSESOPHAGEAL ECHOCARDIOGRAM (TEE);  Surgeon: Dolores Patty, MD;  Location: [facility] ENDOSCOPY;  Service: Cardiovascular;  Laterality: N/A;   TUBAL LIGATION  1974   URETHRAL STRICTURE DILATATION     UTERINE FIBROID SURGERY  1988   `       Current Outpatient Medications  Medication Sig Dispense Refill   ALPRAZolam (XANAX) 0.25 MG tablet Take 0.5-1 tablets (0.125-0.25 mg total) by mouth 2 (two) times daily as needed for anxiety. 30 tablet 0   cholecalciferol (VITAMIN D) 1000 units tablet Take 1,000 Units by mouth daily.      fluticasone (FLONASE) 50 MCG/ACT nasal spray Place 1 spray into both nostrils daily as needed for allergies.      losartan (COZAAR) 25 MG tablet Take 1 tablet (25 mg total) by mouth daily.     Menthol, Topical Analgesic, (ICY HOT EX) Apply 1 application topically daily as needed (back pain).     methimazole (TAPAZOLE) 5 MG tablet Take 5 mg by mouth every morning.      metoprolol tartrate 37.5 MG TABS Take 37.5 mg by mouth 2 (two) times daily.     Multiple Vitamin (MULTIVITAMIN WITH MINERALS) TABS tablet Take 1 tablet by mouth daily.     pantoprazole (PROTONIX) 40 MG tablet Take 40 mg by mouth daily.      polyethylene glycol (MIRALAX) 17 g packet Take 17 g by mouth daily. 14 each 0   Rivaroxaban (XARELTO) 15 MG TABS tablet TAKE 1 TABLET (15 MG TOTAL) BY MOUTH DAILY WITH SUPPER. 90 tablet 0   sotalol (BETAPACE) 80 MG tablet Take 1 tablet (80 mg total) by  mouth 2 (two) times daily. 180 tablet 1   traMADol (ULTRAM) 50 MG tablet Take 50 mg by mouth 3 (three) times daily as needed.     No current facility-administered medications for this visit.    Allergies:   Amiodarone, Diltiazem, Macrobid [nitrofurantoin monohyd macro], Mexiletine, Aspirin, Naproxen sodium, and Sulfa drugs cross reactors   Social History:  The patient  reports that she has never smoked. She has never used smokeless tobacco. She reports that she does not drink alcohol and does not use drugs.   Family History:  The patient's family history includes Colon cancer in her father.   ROS:  Please see the history of present illness.   Otherwise, review of systems is positive for none.   All other systems are reviewed and negative.   PHYSICAL EXAM: VS:  There were no vitals taken for this visit. ,  BMI There is no height or weight on file to calculate BMI. GEN: Well nourished, well developed, in no acute distress  HEENT: normal  Neck: no JVD, carotid bruits, or masses Cardiac: ***RRR; no murmurs, rubs, or gallops,no edema  Respiratory:  clear to auscultation bilaterally, normal work of breathing GI: soft, nontender, nondistended, + BS MS: no deformity or atrophy  Skin: warm and dry Neuro:  Strength and sensation are intact Psych: euthymic mood, full affect  EKG:  EKG {ACTION; IS/IS [id number]} ordered today. Personal review of the ekg ordered *** shows ***   Recent Labs: No results found for requested labs within last 8760 hours.    Lipid Panel     Component Value Date/Time   CHOL 226 (H) 07/16/2015 0557   TRIG 74 07/16/2015 0557   HDL 45 07/16/2015 0557   CHOLHDL 5.0 07/16/2015 0557   VLDL 15 07/16/2015 0557   LDLCALC 166 (H) 07/16/2015 0557     Wt Readings from Last 3 Encounters:  09/17/19 92 lb 9.6 oz (42 kg)  07/05/19 90 lb (40.8 kg)  06/23/19 90 lb (40.8 kg)      Other studies Reviewed: Additional studies/ records that were reviewed today include:  TTE 03/08/2018 Review of the above records today demonstrates:  Left ventricular cavity normal size.  Mild concentric LVH.  EF 40%. Severely dilated left atrium Mild aortic regurgitation Mitral annuloplasty ring in place with severe calcification.  Mild to moderate mitral stenosis.  Mild mitral regurgitation.  ASSESSMENT AND PLAN:  1.  PVCs: Has a high burden of upwards of 20%.  Currently on sotalol 80 mg twice daily.  High risk medication monitoring via ECG and labs.***  2.  Chronic systolic heart failure due to nonischemic cardiomyopathy: Possibly related to PVCs.  Currently on sotalol.***  3.  Paroxysmal atrial fibrillation: Status post maze and left atrial appendage clipping.  Does have atrial flutters.  Currently on sotalol.  CHA2DS2-VASc of at least 4.   Current medicines are reviewed at length with the patient today.   The patient does not have concerns regarding her medicines.  The following changes were made today: ***  Labs/ tests ordered today include:  No orders of the defined types were placed in this encounter.  Disposition:   FU with Florencia Zaccaro *** months  Signed, Aivan Fillingim Jorja Loa, MD  01/29/2021 8:28 AM     [facility] [address] [place] [address] [phone] (office) [phone] (fax)

## 2021-02-03 ENCOUNTER — Telehealth: Payer: Self-pay | Admitting: Cardiology

## 2021-02-03 NOTE — Telephone Encounter (Signed)
Pt c/o medication issue:  1. Name of Medication: Rivaroxaban (XARELTO) 15 MG TABS tablet  2. How are you currently taking this medication (dosage and times per day)? 1 tablet daily  3. Are you having a reaction (difficulty breathing--STAT)? no  4. What is your medication issue? Patient states the medication has increased in price and she would like to know if there are any other medications or generics she can take.

## 2021-02-03 NOTE — Telephone Encounter (Signed)
Pt given Xarelto pt assistance foundation phone number to call. Pt given 2 weeks of samples as she reports she takes her last pill tonight. Pt appreciates our help

## 2021-02-04 NOTE — Telephone Encounter (Signed)
Pt called in tearful b/c she could not remember what she was supposed to tell pt medication assistance program. Pt informed that she needs to let them know she cannot afford her medication and needs help. She was appreciative of my return call and felt much better after talking with me.

## 2021-02-04 NOTE — Telephone Encounter (Signed)
Left message to call back  

## 2021-02-04 NOTE — Telephone Encounter (Signed)
Patient was returning call 

## 2021-03-09 ENCOUNTER — Other Ambulatory Visit: Payer: Self-pay | Admitting: Cardiology

## 2021-03-10 MED ORDER — SOTALOL HCL 80 MG PO TABS: 80.0000 mg | ORAL_TABLET | Freq: Two times a day (BID) | ORAL | 0 refills | Status: DC

## 2021-03-10 NOTE — Addendum Note (Signed)
Addended by: Margaret Pyle D on: 03/10/2021 08:59 AM   Modules accepted: Orders

## 2021-03-17 ENCOUNTER — Other Ambulatory Visit: Payer: Self-pay | Admitting: Cardiology

## 2021-04-08 ENCOUNTER — Other Ambulatory Visit: Payer: Self-pay | Admitting: Cardiology

## 2021-04-18 ENCOUNTER — Other Ambulatory Visit: Payer: Self-pay | Admitting: Cardiology

## 2021-05-21 ENCOUNTER — Other Ambulatory Visit: Payer: Self-pay | Admitting: Cardiology

## 2021-05-21 NOTE — Telephone Encounter (Signed)
°*  STAT* If patient is at the pharmacy, call can be transferred to refill team.   1. Which medications need to be refilled? (please list name of each medication and dose if known) Sotalol  2. Which pharmacy/location (including street and city if local pharmacy) is medication to be sent to?[facility] [address] [place], [place]  3. Do they need a 30 day or 90 day supply? 90 days and refills

## 2021-05-25 ENCOUNTER — Other Ambulatory Visit: Payer: Self-pay | Admitting: Cardiology

## 2021-05-25 MED ORDER — SOTALOL HCL 80 MG PO TABS: 80.0000 mg | ORAL_TABLET | Freq: Two times a day (BID) | ORAL | 0 refills | Status: DC

## 2021-05-25 NOTE — Telephone Encounter (Signed)
Educated pt to importance of needing to keep follow up appointment that we schedule today.  Pt kept explaining that she is not sure that her daughter will be able to bring her.  Informed pt that if she cannot find a ride, then we can provide transportation for her to ensure she is able to get to the necessary overdue follow up for any further refills. Educated further as to why medication will not be refilled beyond next months appt if she does not make it.  She is also asking for Xarelto refills.  Informed that she will not get any refills there at all, from [place], until she is seen and blood work is completed.    Patient verbalized understanding and agreeable to plan.

## 2021-06-16 ENCOUNTER — Other Ambulatory Visit: Payer: Self-pay | Admitting: Cardiology

## 2021-06-17 NOTE — Progress Notes (Unsigned)
Cardiology Office Note Date:  06/17/2021  Patient ID:  [name] [name], [name] [name] 1940-06-26, MRN [id number] PCP:  Burnard Bunting, MD  Electrophysiologist: Dr. Curt Bears  ***refresh   Chief Complaint:  *** annual, med refills  History of Present Illness: [name] [name] is a 81 y.o. female with history of HTN, HLD, VHD (s/p MV repair/MAZE/LAA was oversewn, 2014), NICM, chronic CHF, PVCs, Afib  She comes in today to be seen for Dr. Curt Bears, last seen by him Feb 2021 via tele health visit, she reported symptoms of feeling like her heart was stopping/slowing and planned for monitoring to reassess her PVC burden particularly with some suspicion perhaps CM was PVC induced Maintained on sotalol/low dose metoprolol both.  Emotional on the phone that day  She had a Afib clinic visit May 2021, c/o fatigue, monitor was pending, no changes were made.  *** sotalol EKG, labs *** burden *** symptoms *** meds, CM *** volume *** labs *** needs an echo   Device information AFib hx diagnosed 2014 2019 with reduced LVEF >> PVCs noted  AAD hx Amiodarone 2014 >> thyroiditis 2019 sotalol >> felt poorly >> mexiletine Dec 2019 >> poorly tolerated  .. 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 Dx 04/2012;  b. Amio d/c'd 2/2 hyperthyroidism;  c. Atrial flutter 10/2012;  d. On xarelto.   Bigeminy    CHF (congestive heart failure) (HCC)    Complication of anesthesia    Coronary artery disease    Diverticulitis    GERD (gastroesophageal reflux disease)    Goiter    Headache    Heart murmur    Hyperlipidemia    recently- taken off Crestor- due to pain in her legs, but this was after cardiac cath. , pt. questioning whether the pain in her legs was related to cath. or crestor    Hypertension    Then hypotension 04/2012 limiting med adjustment.   Intractable vomiting    Malnutrition of moderate degree (HCC)    Microscopic hematuria 08/20/2012   Mitral valve prolapse    a. H/o MVP, with severe MR 04/2012, had MV repair and Maze   Pneumonia    treated /w antibiotic- [facility]- early Feb. 2014   PONV (postoperative nausea and vomiting)    Refusal of blood transfusions as patient is Jehovah's Witness    S/P Maze operation for atrial fibrillation 06/28/2012   Complete bilateral atrial lesion set using cryothermy ablation with oversewing of LA appendage via right minithoracotomy   S/P mitral valve repair 06/28/2012   Complex valvuloplasty including quadrangular resection of posterior leaflet, sliding leaflet plasty, artificial Gore-tex neocord placement x2 and 74mm Sorin Memo 3D ring annuloplasty via right mini thoracotomy    SBO (small bowel obstruction) ([place])    Shortness of breath     Past Surgical History:  Procedure Laterality Date   CARDIAC CATHETERIZATION N/A 07/16/2015   Procedure: Left Heart Cath and Coronary Angiography;  Surgeon: Dixie Dials, MD;  Location: [place] CV LAB;  Service: Cardiovascular;  Laterality: N/A;   CARDIOVERSION N/A 07/04/2018   Procedure: CARDIOVERSION;  Surgeon: Acie Fredrickson Wonda Cheng, MD;  Location: [place];  Service: Cardiovascular;  Laterality: N/A;   CORONARY ARTERY BYPASS GRAFT     INTRAOPERATIVE  TRANSESOPHAGEAL ECHOCARDIOGRAM N/A 06/28/2012   Procedure: INTRAOPERATIVE TRANSESOPHAGEAL ECHOCARDIOGRAM;  Surgeon: Rexene Alberts, MD;  Location: [place];  Service: Open Heart Surgery;  Laterality: N/A;   LAPAROSCOPY N/A 05/21/2016   Procedure: LAPAROSCOPY DIAGNOSTIC, LYSIS OF ADHESION BAND;  Surgeon: Ralene Ok, MD;  Location: [place];  Service: General;  Laterality: N/A;   LEFT AND RIGHT HEART CATHETERIZATION WITH CORONARY ANGIOGRAM N/A 05/22/2012   Procedure: LEFT AND RIGHT HEART CATHETERIZATION WITH CORONARY ANGIOGRAM;  Surgeon: Burnell Blanks, MD;  Location: [facility] CATH LAB;  Service: Cardiovascular;  Laterality: N/A;   MINIMALLY INVASIVE MAZE PROCEDURE N/A 06/28/2012   Procedure: MINIMALLY INVASIVE MAZE PROCEDURE;  Surgeon: Rexene Alberts, MD;  Location: [place];  Service: Open Heart Surgery;  Laterality: N/A;   MITRAL VALVE REPAIR Right 06/28/2012   Procedure: MINIMALLY INVASIVE MITRAL VALVE REPAIR (MVR);  Surgeon: Rexene Alberts, MD;  Location: [place];  Service: Open Heart Surgery;  Laterality: Right;   OVARIAN CYST SURGERY  1988   TEE WITHOUT CARDIOVERSION  05/19/2012   Procedure: TRANSESOPHAGEAL ECHOCARDIOGRAM (TEE);  Surgeon: Jolaine Artist, MD;  Location: [facility] ENDOSCOPY;  Service: Cardiovascular;  Laterality: N/A;   [place]   `      Current Outpatient Medications  Medication Sig Dispense Refill   ALPRAZolam (XANAX) 0.25 MG tablet Take 0.5-1 tablets (0.125-0.25 mg total) by mouth 2 (two) times daily as needed for anxiety. 30 tablet 0   cholecalciferol (VITAMIN D) 1000 units tablet Take 1,000 Units by mouth daily.      fluticasone (FLONASE) 50 MCG/ACT nasal spray Place 1 spray into both nostrils daily as needed for allergies.      losartan (COZAAR) 25 MG tablet Take 1 tablet (25 mg total) by mouth daily.     Menthol, Topical Analgesic, (ICY HOT EX) Apply 1 application topically daily as needed (back pain).     methimazole (TAPAZOLE) 5 MG tablet Take 5 mg by mouth every morning.      metoprolol tartrate 37.5 MG TABS Take 37.5 mg by mouth 2 (two) times daily.     Multiple Vitamin (MULTIVITAMIN WITH MINERALS) TABS tablet Take 1 tablet by mouth daily.     pantoprazole (PROTONIX) 40 MG tablet Take 40 mg by mouth daily.      polyethylene glycol (MIRALAX) 17 g packet Take 17 g by mouth daily. 14 each 0   Rivaroxaban (XARELTO) 15 MG TABS tablet TAKE 1 TABLET (15 MG TOTAL) BY MOUTH DAILY WITH SUPPER. 90 tablet 0   sotalol (BETAPACE) 80 MG tablet Take 1  tablet (80 mg total) by mouth 2 (two) times daily. Please schedule an appt. With Dr. Curt Bears in order to receive future refills. Thank you. 2nd Attempt. 60 tablet 0   traMADol (ULTRAM) 50 MG tablet Take 50 mg by mouth 3 (three) times daily as needed.     No current facility-administered medications for this visit.    Allergies:   Amiodarone, Diltiazem, Macrobid [nitrofurantoin monohyd macro], Mexiletine, Aspirin, Naproxen sodium, and Sulfa drugs cross reactors   Social History:  The patient  reports that she has never smoked. She has never used smokeless tobacco. She reports that she does not drink alcohol and does not use drugs.   Family History:  The patient's family history includes Colon cancer in her father.  ROS:  Please see the history of present illness.    All other systems  are reviewed and otherwise negative.   PHYSICAL EXAM:  VS:  There were no vitals taken for this visit. BMI: There is no height or weight on file to calculate BMI. Well nourished, well developed, in no acute distress HEENT: normocephalic, atraumatic Neck: no JVD, carotid bruits or masses Cardiac:  *** RRR; no significant murmurs, no rubs, or gallops Lungs:  *** CTA b/l, no wheezing, rhonchi or rales Abd: soft, nontender MS: no deformity or *** atrophy Ext: *** no edema Skin: warm and dry, no rash Neuro:  No gross deficits appreciated Psych: euthymic mood, full affect   EKG:  Done today and reviewed by myself shows  ***   04/21/2019: TTE  1. Left ventricular ejection fraction, by visual estimation, is 30 to  35%. The left ventricle has severely decreased function. There is no left  ventricular hypertrophy.   2. Left ventricular diastolic parameters are consistent with Grade II  diastolic dysfunction (pseudonormalization).   3. The left ventricle demonstrates global hypokinesis.   4. Global right ventricle has normal systolic function.The right  ventricular size is normal. No increase in right  ventricular wall  thickness.   5. Left atrial size was normal.   6. Right atrial size was severely dilated.   7. The mitral valve is normal in structure. No evidence of mitral valve  regurgitation. No evidence of mitral stenosis.   8. MV repair, mean gradient 73mmHg.   9. The tricuspid valve is normal in structure.  10. Aortic valve regurgitation is moderate.  11. The aortic valve is tricuspid. Aortic valve regurgitation is moderate.  No evidence of aortic valve sclerosis or stenosis.  12. The pulmonic valve was normal in structure. Pulmonic valve  regurgitation is not visualized.  13. The inferior vena cava is normal in size with greater than 50%  respiratory variability, suggesting right atrial pressure of 3 mmHg.    07/16/2015: LHC Left Main  Vessel was injected. Vessel is normal in caliber.    Left Anterior Descending  Vessel was injected. Vessel is normal in caliber. Short    Ramus Intermedius  Vessel was injected. Vessel is small. Vessel is angiographically normal.    Left Circumflex  Vessel was injected. Vessel is normal in caliber. Vessel is angiographically normal.    Right Coronary Artery  Vessel was injected. Vessel is large. Vessel is angiographically normal.      Recent Labs: No results found for requested labs within last 8760 hours.  No results found for requested labs within last 8760 hours.   CrCl cannot be calculated (Patient's most recent lab result is older than the maximum 21 days allowed.).   Wt Readings from Last 3 Encounters:  09/17/19 92 lb 9.6 oz (42 kg)  07/05/19 90 lb (40.8 kg)  06/23/19 90 lb (40.8 kg)     Other studies reviewed: Additional studies/records reviewed today include: summarized above  ASSESSMENT AND PLAN:  Paroxysmal AFib CHA2DS2Vasc is 5, on *** xarelto, appropriately dosed [place] by symptoms *** sotalol, *** QTc  PVCs Sotalol ***  NICM ***  VHD Hx of MV repair ***  Disposition: F/u with ***  Current  medicines are reviewed at length with the patient today.  The patient did not have any concerns regarding medicines.  Venetia Night, PA-C 06/17/2021 7:56 PM     [place] [address] [phone] (office)  [phone] (fax)

## 2021-06-18 ENCOUNTER — Ambulatory Visit: Payer: PPO | Admitting: Physician Assistant

## 2021-06-24 DIAGNOSIS — I480 Paroxysmal atrial fibrillation: Secondary | ICD-10-CM | POA: Diagnosis not present

## 2021-06-24 DIAGNOSIS — I110 Hypertensive heart disease with heart failure: Secondary | ICD-10-CM | POA: Diagnosis not present

## 2021-06-24 DIAGNOSIS — I10 Essential (primary) hypertension: Secondary | ICD-10-CM | POA: Diagnosis not present

## 2021-06-24 DIAGNOSIS — Z7901 Long term (current) use of anticoagulants: Secondary | ICD-10-CM | POA: Diagnosis not present

## 2021-06-24 DIAGNOSIS — I700 Atherosclerosis of aorta: Secondary | ICD-10-CM | POA: Diagnosis not present

## 2021-08-04 ENCOUNTER — Other Ambulatory Visit: Payer: Self-pay | Admitting: Cardiology

## 2021-08-26 ENCOUNTER — Other Ambulatory Visit: Payer: Self-pay | Admitting: Cardiology

## 2021-09-02 ENCOUNTER — Other Ambulatory Visit: Payer: Self-pay | Admitting: Cardiology

## 2021-09-28 ENCOUNTER — Other Ambulatory Visit: Payer: Self-pay | Admitting: Cardiology

## 2021-10-09 DIAGNOSIS — N390 Urinary tract infection, site not specified: Secondary | ICD-10-CM | POA: Diagnosis not present

## 2021-10-09 DIAGNOSIS — R300 Dysuria: Secondary | ICD-10-CM | POA: Diagnosis not present

## 2021-10-14 DIAGNOSIS — R109 Unspecified abdominal pain: Secondary | ICD-10-CM | POA: Diagnosis not present

## 2021-10-14 DIAGNOSIS — Z87448 Personal history of other diseases of urinary system: Secondary | ICD-10-CM | POA: Diagnosis not present

## 2021-10-14 DIAGNOSIS — R531 Weakness: Secondary | ICD-10-CM | POA: Diagnosis not present

## 2021-10-15 ENCOUNTER — Other Ambulatory Visit: Payer: Self-pay

## 2021-10-15 ENCOUNTER — Emergency Department (HOSPITAL_COMMUNITY): Payer: PPO

## 2021-10-15 ENCOUNTER — Inpatient Hospital Stay (HOSPITAL_COMMUNITY)
Admission: EM | Admit: 2021-10-15 | Discharge: 2021-10-27 | DRG: 308 | Disposition: A | Discharge status: Skilled Nursing Facility | Payer: PPO | Attending: Family Medicine | Admitting: Family Medicine

## 2021-10-15 ENCOUNTER — Encounter (HOSPITAL_COMMUNITY): Payer: Self-pay

## 2021-10-15 DIAGNOSIS — K5730 Diverticulosis of large intestine without perforation or abscess without bleeding: Secondary | ICD-10-CM | POA: Diagnosis not present

## 2021-10-15 DIAGNOSIS — I4819 Other persistent atrial fibrillation: Principal | ICD-10-CM | POA: Diagnosis present

## 2021-10-15 DIAGNOSIS — I498 Other specified cardiac arrhythmias: Secondary | ICD-10-CM | POA: Diagnosis not present

## 2021-10-15 DIAGNOSIS — R011 Cardiac murmur, unspecified: Secondary | ICD-10-CM | POA: Diagnosis present

## 2021-10-15 DIAGNOSIS — E876 Hypokalemia: Secondary | ICD-10-CM | POA: Diagnosis not present

## 2021-10-15 DIAGNOSIS — Z743 Need for continuous supervision: Secondary | ICD-10-CM | POA: Diagnosis not present

## 2021-10-15 DIAGNOSIS — Z882 Allergy status to sulfonamides status: Secondary | ICD-10-CM | POA: Diagnosis not present

## 2021-10-15 DIAGNOSIS — I110 Hypertensive heart disease with heart failure: Secondary | ICD-10-CM | POA: Diagnosis not present

## 2021-10-15 DIAGNOSIS — R9431 Abnormal electrocardiogram [ECG] [EKG]: Secondary | ICD-10-CM

## 2021-10-15 DIAGNOSIS — Z7901 Long term (current) use of anticoagulants: Secondary | ICD-10-CM

## 2021-10-15 DIAGNOSIS — M6281 Muscle weakness (generalized): Secondary | ICD-10-CM | POA: Diagnosis not present

## 2021-10-15 DIAGNOSIS — N139 Obstructive and reflux uropathy, unspecified: Secondary | ICD-10-CM | POA: Diagnosis present

## 2021-10-15 DIAGNOSIS — R079 Chest pain, unspecified: Secondary | ICD-10-CM | POA: Diagnosis not present

## 2021-10-15 DIAGNOSIS — R000 Tachycardia, unspecified: Secondary | ICD-10-CM | POA: Diagnosis not present

## 2021-10-15 DIAGNOSIS — Z531 Procedure and treatment not carried out because of patient's decision for reasons of belief and group pressure: Secondary | ICD-10-CM | POA: Diagnosis not present

## 2021-10-15 DIAGNOSIS — E0590 Thyrotoxicosis, unspecified without thyrotoxic crisis or storm: Secondary | ICD-10-CM | POA: Insufficient documentation

## 2021-10-15 DIAGNOSIS — E785 Hyperlipidemia, unspecified: Secondary | ICD-10-CM | POA: Diagnosis present

## 2021-10-15 DIAGNOSIS — R531 Weakness: Secondary | ICD-10-CM | POA: Diagnosis not present

## 2021-10-15 DIAGNOSIS — I4891 Unspecified atrial fibrillation: Secondary | ICD-10-CM | POA: Diagnosis not present

## 2021-10-15 DIAGNOSIS — I2510 Atherosclerotic heart disease of native coronary artery without angina pectoris: Secondary | ICD-10-CM | POA: Diagnosis not present

## 2021-10-15 DIAGNOSIS — R3129 Other microscopic hematuria: Secondary | ICD-10-CM | POA: Diagnosis not present

## 2021-10-15 DIAGNOSIS — F411 Generalized anxiety disorder: Secondary | ICD-10-CM | POA: Diagnosis not present

## 2021-10-15 DIAGNOSIS — T462X5A Adverse effect of other antidysrhythmic drugs, initial encounter: Secondary | ICD-10-CM | POA: Diagnosis present

## 2021-10-15 DIAGNOSIS — R0789 Other chest pain: Secondary | ICD-10-CM | POA: Diagnosis not present

## 2021-10-15 DIAGNOSIS — I4892 Unspecified atrial flutter: Secondary | ICD-10-CM | POA: Diagnosis not present

## 2021-10-15 DIAGNOSIS — F0392 Unspecified dementia, unspecified severity, with psychotic disturbance: Secondary | ICD-10-CM | POA: Diagnosis not present

## 2021-10-15 DIAGNOSIS — Z79899 Other long term (current) drug therapy: Secondary | ICD-10-CM

## 2021-10-15 DIAGNOSIS — Z888 Allergy status to other drugs, medicaments and biological substances status: Secondary | ICD-10-CM

## 2021-10-15 DIAGNOSIS — Z886 Allergy status to analgesic agent status: Secondary | ICD-10-CM | POA: Diagnosis not present

## 2021-10-15 DIAGNOSIS — D689 Coagulation defect, unspecified: Secondary | ICD-10-CM | POA: Diagnosis not present

## 2021-10-15 DIAGNOSIS — Z951 Presence of aortocoronary bypass graft: Secondary | ICD-10-CM | POA: Diagnosis not present

## 2021-10-15 DIAGNOSIS — E440 Moderate protein-calorie malnutrition: Secondary | ICD-10-CM | POA: Diagnosis present

## 2021-10-15 DIAGNOSIS — K219 Gastro-esophageal reflux disease without esophagitis: Secondary | ICD-10-CM | POA: Diagnosis not present

## 2021-10-15 DIAGNOSIS — I10 Essential (primary) hypertension: Secondary | ICD-10-CM | POA: Diagnosis not present

## 2021-10-15 DIAGNOSIS — F05 Delirium due to known physiological condition: Secondary | ICD-10-CM | POA: Diagnosis not present

## 2021-10-15 DIAGNOSIS — Z800 Family history of malignant neoplasm of digestive organs: Secondary | ICD-10-CM

## 2021-10-15 DIAGNOSIS — F419 Anxiety disorder, unspecified: Secondary | ICD-10-CM | POA: Diagnosis present

## 2021-10-15 DIAGNOSIS — F0394 Unspecified dementia, unspecified severity, with anxiety: Secondary | ICD-10-CM | POA: Diagnosis not present

## 2021-10-15 DIAGNOSIS — I5022 Chronic systolic (congestive) heart failure: Secondary | ICD-10-CM | POA: Diagnosis not present

## 2021-10-15 DIAGNOSIS — I350 Nonrheumatic aortic (valve) stenosis: Secondary | ICD-10-CM | POA: Diagnosis not present

## 2021-10-15 DIAGNOSIS — G9341 Metabolic encephalopathy: Secondary | ICD-10-CM

## 2021-10-15 DIAGNOSIS — Z602 Problems related to living alone: Secondary | ICD-10-CM | POA: Diagnosis present

## 2021-10-15 DIAGNOSIS — G453 Amaurosis fugax: Secondary | ICD-10-CM | POA: Diagnosis not present

## 2021-10-15 DIAGNOSIS — Z87440 Personal history of urinary (tract) infections: Secondary | ICD-10-CM | POA: Diagnosis not present

## 2021-10-15 DIAGNOSIS — I700 Atherosclerosis of aorta: Secondary | ICD-10-CM | POA: Diagnosis not present

## 2021-10-15 DIAGNOSIS — L89151 Pressure ulcer of sacral region, stage 1: Secondary | ICD-10-CM | POA: Diagnosis present

## 2021-10-15 DIAGNOSIS — I428 Other cardiomyopathies: Secondary | ICD-10-CM | POA: Diagnosis not present

## 2021-10-15 DIAGNOSIS — I499 Cardiac arrhythmia, unspecified: Secondary | ICD-10-CM | POA: Diagnosis not present

## 2021-10-15 DIAGNOSIS — I480 Paroxysmal atrial fibrillation: Secondary | ICD-10-CM | POA: Diagnosis not present

## 2021-10-15 DIAGNOSIS — Z681 Body mass index (BMI) 19 or less, adult: Secondary | ICD-10-CM

## 2021-10-15 DIAGNOSIS — I5042 Chronic combined systolic (congestive) and diastolic (congestive) heart failure: Secondary | ICD-10-CM | POA: Diagnosis not present

## 2021-10-15 DIAGNOSIS — M810 Age-related osteoporosis without current pathological fracture: Secondary | ICD-10-CM | POA: Diagnosis not present

## 2021-10-15 DIAGNOSIS — Z91148 Patient's other noncompliance with medication regimen for other reason: Secondary | ICD-10-CM

## 2021-10-15 DIAGNOSIS — L8990 Pressure ulcer of unspecified site, unspecified stage: Secondary | ICD-10-CM | POA: Insufficient documentation

## 2021-10-15 DIAGNOSIS — I340 Nonrheumatic mitral (valve) insufficiency: Secondary | ICD-10-CM | POA: Diagnosis not present

## 2021-10-15 DIAGNOSIS — N10 Acute tubulo-interstitial nephritis: Secondary | ICD-10-CM | POA: Diagnosis not present

## 2021-10-15 DIAGNOSIS — R457 State of emotional shock and stress, unspecified: Secondary | ICD-10-CM | POA: Diagnosis not present

## 2021-10-15 DIAGNOSIS — M549 Dorsalgia, unspecified: Secondary | ICD-10-CM | POA: Diagnosis not present

## 2021-10-15 DIAGNOSIS — K5792 Diverticulitis of intestine, part unspecified, without perforation or abscess without bleeding: Secondary | ICD-10-CM | POA: Diagnosis not present

## 2021-10-15 DIAGNOSIS — I493 Ventricular premature depolarization: Secondary | ICD-10-CM | POA: Diagnosis present

## 2021-10-15 DIAGNOSIS — I959 Hypotension, unspecified: Secondary | ICD-10-CM | POA: Diagnosis not present

## 2021-10-15 DIAGNOSIS — I509 Heart failure, unspecified: Secondary | ICD-10-CM | POA: Diagnosis not present

## 2021-10-15 DIAGNOSIS — E049 Nontoxic goiter, unspecified: Secondary | ICD-10-CM | POA: Diagnosis not present

## 2021-10-15 LAB — CBC WITH DIFFERENTIAL/PLATELET
Abs Immature Granulocytes: 0.03 10*3/uL (ref 0.00–0.07)
Basophils Absolute: 0 10*3/uL (ref 0.0–0.1)
Basophils Relative: 0 %
Eosinophils Absolute: 0.1 10*3/uL (ref 0.0–0.5)
Eosinophils Relative: 1 %
HCT: 43.3 % (ref 36.0–46.0)
Hemoglobin: 14.3 g/dL (ref 12.0–15.0)
Immature Granulocytes: 0 %
Lymphocytes Relative: 15 %
Lymphs Abs: 1.4 10*3/uL (ref 0.7–4.0)
MCH: 30 pg (ref 26.0–34.0)
MCHC: 33 g/dL (ref 30.0–36.0)
MCV: 90.8 fL (ref 80.0–100.0)
Monocytes Absolute: 0.7 10*3/uL (ref 0.1–1.0)
Monocytes Relative: 8 %
Neutro Abs: 6.8 10*3/uL (ref 1.7–7.7)
Neutrophils Relative %: 76 %
Platelets: 246 10*3/uL (ref 150–400)
RBC: 4.77 MIL/uL (ref 3.87–5.11)
RDW: 13.4 % (ref 11.5–15.5)
WBC: 9 10*3/uL (ref 4.0–10.5)
nRBC: 0 % (ref 0.0–0.2)

## 2021-10-15 LAB — URINALYSIS, ROUTINE W REFLEX MICROSCOPIC
Bacteria, UA: NONE SEEN
Bilirubin Urine: NEGATIVE
Glucose, UA: NEGATIVE mg/dL
Ketones, ur: 20 mg/dL — AB
Leukocytes,Ua: NEGATIVE
Nitrite: NEGATIVE
Protein, ur: 30 mg/dL — AB
Specific Gravity, Urine: 1.021 (ref 1.005–1.030)
pH: 6 (ref 5.0–8.0)

## 2021-10-15 LAB — COMPREHENSIVE METABOLIC PANEL
ALT: 10 U/L (ref 0–44)
AST: 17 U/L (ref 15–41)
Albumin: 4.1 g/dL (ref 3.5–5.0)
Alkaline Phosphatase: 53 U/L (ref 38–126)
Anion gap: 8 (ref 5–15)
BUN: 15 mg/dL (ref 8–23)
CO2: 25 mmol/L (ref 22–32)
Calcium: 10.3 mg/dL (ref 8.9–10.3)
Chloride: 101 mmol/L (ref 98–111)
Creatinine, Ser: 0.69 mg/dL (ref 0.44–1.00)
GFR, Estimated: >60 mL/min (ref >60)
Glucose, Bld: 110 mg/dL — ABNORMAL HIGH (ref 70–99)
Potassium: 4.1 mmol/L (ref 3.5–5.1)
Sodium: 134 mmol/L — ABNORMAL LOW (ref 135–145)
Total Bilirubin: 1.3 mg/dL — ABNORMAL HIGH (ref 0.3–1.2)
Total Protein: 8.1 g/dL (ref 6.5–8.1)

## 2021-10-15 LAB — PROCALCITONIN: Procalcitonin: <0.1 ng/mL

## 2021-10-15 LAB — TROPONIN I (HIGH SENSITIVITY)
Troponin I (High Sensitivity): 13 ng/L (ref <18)
Troponin I (High Sensitivity): 10 ng/L (ref <18)

## 2021-10-15 LAB — TSH: TSH: 0.238 u[IU]/mL — ABNORMAL LOW (ref 0.350–4.500)

## 2021-10-15 LAB — LACTIC ACID, PLASMA
Lactic Acid, Venous: 1.6 mmol/L (ref 0.5–1.9)
Lactic Acid, Venous: 1.3 mmol/L (ref 0.5–1.9)

## 2021-10-15 LAB — MAGNESIUM: Magnesium: 2 mg/dL (ref 1.7–2.4)

## 2021-10-15 LAB — PROTIME-INR
INR: 1.1 (ref 0.8–1.2)
Prothrombin Time: 13.6 seconds (ref 11.4–15.2)

## 2021-10-15 LAB — T4, FREE: Free T4: 0.96 ng/dL (ref 0.61–1.12)

## 2021-10-15 MED ORDER — LORAZEPAM 2 MG/ML IJ SOLN
1.0000 mg | Freq: Four times a day (QID) | INTRAMUSCULAR | Status: DC | PRN
  Administered 2021-10-16 – 2021-10-20 (×2): 1 mg via INTRAVENOUS
  Filled 2021-10-15 (×2): qty 1

## 2021-10-15 MED ORDER — RIVAROXABAN 15 MG PO TABS
15.0000 mg | ORAL_TABLET | Freq: Every day | ORAL | Status: DC
  Administered 2021-10-16 – 2021-10-26 (×11): 15 mg via ORAL
  Filled 2021-10-15 (×11): qty 1

## 2021-10-15 MED ORDER — LORAZEPAM 2 MG/ML IJ SOLN
1.0000 mg | Freq: Once | INTRAMUSCULAR | Status: AC
  Administered 2021-10-15: 1 mg via INTRAVENOUS
  Filled 2021-10-15: qty 1

## 2021-10-15 MED ORDER — HYDROCODONE-ACETAMINOPHEN 5-325 MG PO TABS
1.0000 | ORAL_TABLET | ORAL | Status: DC | PRN
  Administered 2021-10-16 – 2021-10-19 (×8): 1 via ORAL
  Administered 2021-10-19 – 2021-10-22 (×2): 2 via ORAL
  Administered 2021-10-22 – 2021-10-26 (×6): 1 via ORAL
  Filled 2021-10-15 (×4): qty 1
  Filled 2021-10-15 (×3): qty 2
  Filled 2021-10-15 (×6): qty 1
  Filled 2021-10-15: qty 2
  Filled 2021-10-15 (×2): qty 1

## 2021-10-15 MED ORDER — METOPROLOL TARTRATE 5 MG/5ML IV SOLN
5.0000 mg | Freq: Once | INTRAVENOUS | Status: AC
  Administered 2021-10-15: 5 mg via INTRAVENOUS
  Filled 2021-10-15: qty 5

## 2021-10-15 MED ORDER — RIVAROXABAN 15 MG PO TABS
15.0000 mg | ORAL_TABLET | Freq: Once | ORAL | Status: AC
  Administered 2021-10-15: 15 mg via ORAL
  Filled 2021-10-15: qty 1

## 2021-10-15 MED ORDER — ONDANSETRON HCL 4 MG/2ML IJ SOLN
4.0000 mg | Freq: Once | INTRAMUSCULAR | Status: AC
  Administered 2021-10-15: 4 mg via INTRAVENOUS
  Filled 2021-10-15: qty 2

## 2021-10-15 MED ORDER — SODIUM CHLORIDE 0.9 % IV SOLN
2.0000 g | Freq: Once | INTRAVENOUS | Status: AC
  Administered 2021-10-15: 2 g via INTRAVENOUS
  Filled 2021-10-15: qty 20

## 2021-10-15 MED ORDER — LIP MEDEX EX OINT
TOPICAL_OINTMENT | CUTANEOUS | Status: DC | PRN
  Filled 2021-10-15: qty 7

## 2021-10-15 MED ORDER — SOTALOL HCL 80 MG PO TABS
80.0000 mg | ORAL_TABLET | Freq: Once | ORAL | Status: AC
  Administered 2021-10-15: 80 mg via ORAL
  Filled 2021-10-15: qty 1

## 2021-10-15 MED ORDER — ONDANSETRON HCL 4 MG/2ML IJ SOLN
4.0000 mg | Freq: Four times a day (QID) | INTRAMUSCULAR | Status: DC | PRN
  Filled 2021-10-15: qty 2

## 2021-10-15 MED ORDER — LOSARTAN POTASSIUM 25 MG PO TABS
25.0000 mg | ORAL_TABLET | Freq: Every day | ORAL | Status: DC
  Administered 2021-10-16 – 2021-10-21 (×6): 25 mg via ORAL
  Filled 2021-10-15 (×8): qty 1

## 2021-10-15 MED ORDER — LACTATED RINGERS IV BOLUS (SEPSIS)
1000.0000 mL | Freq: Once | INTRAVENOUS | Status: AC
  Administered 2021-10-15: 1000 mL via INTRAVENOUS

## 2021-10-15 MED ORDER — ONDANSETRON HCL 4 MG PO TABS
4.0000 mg | ORAL_TABLET | Freq: Four times a day (QID) | ORAL | Status: DC | PRN
  Administered 2021-10-16: 4 mg via ORAL

## 2021-10-15 MED ORDER — SODIUM CHLORIDE (PF) 0.9 % IJ SOLN
INTRAMUSCULAR | Status: AC
  Filled 2021-10-15: qty 50

## 2021-10-15 MED ORDER — IOHEXOL 300 MG/ML  SOLN
100.0000 mL | Freq: Once | INTRAMUSCULAR | Status: AC | PRN
  Administered 2021-10-15: 100 mL via INTRAVENOUS

## 2021-10-15 MED ORDER — ACETAMINOPHEN 325 MG PO TABS
650.0000 mg | ORAL_TABLET | Freq: Four times a day (QID) | ORAL | Status: DC | PRN
  Administered 2021-10-17 – 2021-10-21 (×4): 650 mg via ORAL
  Administered 2021-10-22 (×2): 325 mg via ORAL
  Administered 2021-10-25 – 2021-10-26 (×2): 650 mg via ORAL
  Filled 2021-10-15 (×8): qty 2

## 2021-10-15 MED ORDER — METOPROLOL TARTRATE 5 MG/5ML IV SOLN
5.0000 mg | Freq: Four times a day (QID) | INTRAVENOUS | Status: DC | PRN
  Administered 2021-10-15 – 2021-10-17 (×7): 5 mg via INTRAVENOUS
  Filled 2021-10-15 (×7): qty 5

## 2021-10-15 MED ORDER — ENSURE ENLIVE PO LIQD
237.0000 mL | Freq: Two times a day (BID) | ORAL | Status: DC
  Administered 2021-10-15 – 2021-10-26 (×16): 237 mL via ORAL

## 2021-10-15 MED ORDER — METOPROLOL TARTRATE 50 MG PO TABS
50.0000 mg | ORAL_TABLET | Freq: Two times a day (BID) | ORAL | Status: DC
  Administered 2021-10-15 – 2021-10-16 (×2): 50 mg via ORAL
  Filled 2021-10-15 (×2): qty 1
  Filled 2021-10-15: qty 2

## 2021-10-15 MED ORDER — ACETAMINOPHEN 650 MG RE SUPP: 650.0000 mg | Freq: Four times a day (QID) | RECTAL | Status: DC | PRN

## 2021-10-15 NOTE — ED Triage Notes (Signed)
Patient here from home reporting weakness and nausea that started 3 days ago. UTI - 1 week of amoxicillin. Currently in a-fib stopped her Xarelto "because someone told me to stop it".

## 2021-10-15 NOTE — Consult Note (Signed)
Cardiology Consultation:   Patient ID: [name] [name] MRN: [id number]; DOB: [birth year]  Admit date: 10/15/2021 Date of Consult: 10/15/2021  PCP:  Burnard Bunting, MD   [facility] Providers Cardiologist:  Dorris Carnes, MD has also seen Dr. Einar Gip in the past and in 2017 had a cardiac catheterization by Dr. Doylene Canard Electrophysiologist:  Will Meredith Leeds, MD       Patient Profile:   [name] [name] is a 81 y.o. female with a hx of persistent atrial fibrillation who is being seen 10/15/2021 for the evaluation of atrial fibrillation with rapid ventricular response at the request of Dr. Gilford Raid.  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  Prior frequent PVCs of up to 22%.  Home health collected urine and primary care physician had started Augmentin.  Her Xarelto was placed on hold because of hematuria.  She was also told to stop her antiarrhythmic sotalol.  At her last visit with Dr. Harrington Challenger, she had requested she see a female cardiologist.  This was back in 2020.  In talking with her today, she is really unable to answer any questions.  She is pleasant smiling but fairly nonconversant.  Her only daughter is at bedside with her and helped with historical facts.  She is unsure if she was taking the sotalol.  She denies that she is having any chest pain.  No significant shortness of breath.  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 Dx 04/2012;  b. Amio d/c'd 2/2 hyperthyroidism;  c. Atrial flutter 10/2012;  d. On xarelto.   Bigeminy    CHF (congestive heart failure) (HCC)    Complication of anesthesia    Coronary artery disease    Diverticulitis    GERD (gastroesophageal reflux disease)    Goiter    Headache    Heart murmur    Hyperlipidemia    recently- taken off Crestor- due to pain in her legs, but this was after cardiac cath. , pt. questioning whether the pain in her legs was related to cath. or crestor   Hypertension    Then hypotension 04/2012 limiting med adjustment.   Intractable vomiting    Malnutrition of moderate degree (HCC)    Microscopic hematuria 08/20/2012   Mitral valve prolapse    a. H/o MVP, with severe MR 04/2012, had MV repair and Maze   Pneumonia    treated /w antibiotic- [facility]- early Feb. 2014   PONV (postoperative nausea and vomiting)    Refusal of blood transfusions as patient is Jehovah's Witness    S/P Maze operation for atrial fibrillation 06/28/2012   Complete bilateral atrial lesion set using cryothermy ablation with oversewing of LA appendage via right minithoracotomy   S/P mitral valve repair 06/28/2012   Complex valvuloplasty including quadrangular resection of posterior leaflet, sliding leaflet plasty, artificial Gore-tex neocord placement x2 and 14mm Sorin Memo 3D ring annuloplasty via right mini thoracotomy    SBO (small bowel obstruction) ([place])  Shortness of breath     Past Surgical History:  Procedure Laterality Date   CARDIAC CATHETERIZATION N/A 07/16/2015   Procedure: Left Heart Cath and Coronary Angiography;  Surgeon: Dixie Dials, MD;  Location: [place] [facility];  Service: Cardiovascular;  Laterality: N/A;   CARDIOVERSION N/A 07/04/2018   Procedure: CARDIOVERSION;  Surgeon: Acie Fredrickson Wonda Cheng, MD;  Location: [place];  Service: Cardiovascular;  Laterality: N/A;   CORONARY ARTERY BYPASS GRAFT     INTRAOPERATIVE TRANSESOPHAGEAL ECHOCARDIOGRAM N/A  06/28/2012   Procedure: INTRAOPERATIVE TRANSESOPHAGEAL ECHOCARDIOGRAM;  Surgeon: Rexene Alberts, MD;  Location: [place];  Service: Open Heart Surgery;  Laterality: N/A;   LAPAROSCOPY N/A 05/21/2016   Procedure: LAPAROSCOPY DIAGNOSTIC, LYSIS OF ADHESION BAND;  Surgeon: Ralene Ok, MD;  Location: [place];  Service: General;  Laterality: N/A;   LEFT AND RIGHT HEART CATHETERIZATION WITH CORONARY ANGIOGRAM N/A 05/22/2012   Procedure: LEFT AND RIGHT HEART CATHETERIZATION WITH CORONARY ANGIOGRAM;  Surgeon: Burnell Blanks, MD;  Location: [facility];  Service: Cardiovascular;  Laterality: N/A;   MINIMALLY INVASIVE MAZE PROCEDURE N/A 06/28/2012   Procedure: MINIMALLY INVASIVE MAZE PROCEDURE;  Surgeon: Rexene Alberts, MD;  Location: [place];  Service: Open Heart Surgery;  Laterality: N/A;   MITRAL VALVE REPAIR Right 06/28/2012   Procedure: MINIMALLY INVASIVE MITRAL VALVE REPAIR (MVR);  Surgeon: Rexene Alberts, MD;  Location: [place];  Service: Open Heart Surgery;  Laterality: Right;   OVARIAN CYST SURGERY  1988   TEE WITHOUT CARDIOVERSION  05/19/2012   Procedure: TRANSESOPHAGEAL ECHOCARDIOGRAM (TEE);  Surgeon: Jolaine Artist, MD;  Location: [facility] ENDOSCOPY;  Service: Cardiovascular;  Laterality: N/A;   [place]   `       Home Medications:  Prior to Admission medications   Medication Sig Start Date End Date Taking? Authorizing Provider  ALPRAZolam (XANAX) 0.25 MG tablet Take 0.5-1 tablets (0.125-0.25 mg total) by mouth 2 (two) times daily as needed for anxiety. Patient taking differently: Take 0.25 mg by mouth at bedtime. 04/25/19  Yes Alma Friendly, MD  amoxicillin-clavulanate (AUGMENTIN) 500-125 MG tablet Take 1 tablet by mouth 2 (two) times daily. 10/09/21  Yes [provider]  polyethylene glycol (MIRALAX) 17 g packet Take 17 g by mouth daily. Patient taking differently: Take 17 g by mouth daily as needed  (constipation). 06/23/19  Yes Rancour, Annie Main, MD  traMADol (ULTRAM) 50 MG tablet Take 50 mg by mouth 2 (two) times daily as needed (pain). 08/01/19  Yes [provider]  losartan (COZAAR) 25 MG tablet Take 1 tablet (25 mg total) by mouth daily. Patient not taking: Reported on 10/15/2021 04/26/19   Alma Friendly, MD  Multiple Vitamin (MULTIVITAMIN WITH MINERALS) TABS tablet Take 1 tablet by mouth daily. Patient not taking: Reported on 10/15/2021 04/26/19   Alma Friendly, MD  Rivaroxaban (XARELTO) 15 MG TABS tablet Take 1 tablet (15 mg total) by mouth daily with supper. Pt. Needs to make an appt .with Dr. Curt Bears in order to receive future refills. Thank You. 2nd Attempt. Patient not taking: Reported on 10/15/2021 09/29/21   Constance Haw, MD  sotalol (BETAPACE) 80 MG tablet TAKE 1 TABLET BY MOUTH TWICE A DAY *NEED APPOINTMENT FOR REFILLS* Patient not taking: Reported on 10/15/2021 09/29/21   Constance Haw, MD    Inpatient Medications: Scheduled Meds:  sotalol  80 mg Oral Once   Continuous Infusions:  PRN Meds:   Allergies:    Allergies  Allergen Reactions   Amiodarone Other (See Comments)    Hyperthyroidism    Diltiazem Other (See Comments)    Put into hospital for 3 days   Macrobid [Nitrofurantoin Monohyd Macro] Nausea Only   Mexiletine Other (See Comments)    Confusion, pain in the face   Aspirin Rash    Tolerates low dose aspirin   Naproxen Sodium Swelling and Rash   Sulfa Drugs Cross Reactors Other (See Comments)    Allergy per mother - unknown reaction    Social History:   Social History   Socioeconomic History   Marital status: Divorced    Spouse name: Not on file   Number of children: Not on file   Years of education: Not on file   Highest education level: Not on file  Occupational History   Not on file  Tobacco Use   Smoking status: Never   Smokeless tobacco: Never  Vaping Use   Vaping Use: Never used  Substance and Sexual  Activity   Alcohol use: No    Comment: 1 drink a month   Drug use: No   Sexual activity: Never  Other Topics Concern   Not on file  Social History Narrative   Divorced - lives alone - remains functionally independent.  Has 3 children.   Social Determinants of Health   Financial Resource Strain: Not on file  Food Insecurity: Not on file  Transportation Needs: Not on file  Physical Activity: Not on file  Stress: Not on file  Social Connections: Not on file  Intimate Partner Violence: Not on file    Family History:    Family History  Problem Relation Age of Onset   Colon cancer Father      ROS:  Please see the history of present illness.   All other ROS reviewed and negative.     Physical Exam/Data:   Vitals:   10/15/21 1145 10/15/21 1215 10/15/21 1230 10/15/21 1245  BP: (!) 153/91 (!) 165/86 (!) 146/85 (!) 147/78  Pulse: (!) 128 (!) 125 (!) 124 (!) 126  Resp: (!) 21 (!) 22 (!) 24 17  Temp:      TempSrc:      SpO2: 100% 99% 100% 98%  Weight:      Height:       No intake or output data in the 24 hours ending 10/15/21 1357    10/15/2021    8:45 AM 09/17/2019    3:17 PM 07/05/2019    2:27 PM  Last 3 Weights  Weight (lbs) 94 lb 12.8 oz 92 lb 9.6 oz 90 lb  Weight (kg) 43 kg 42.003 kg 40.824 kg     Body mass index is 15.3 kg/m.  General:  Well nourished, well developed, in no acute distress, mildly weak HEENT: normal Neck: no JVD Vascular: No carotid bruits; Distal pulses 2+ bilaterally Cardiac: Irregularly irregular, tachycardic, 2/6 systolic murmur  Lungs:  clear to auscultation bilaterally, no wheezing, rhonchi or rales  Abd: soft, nontender, no hepatomegaly  Ext: no edema Musculoskeletal:  No deformities, BUE and BLE strength normal and equal Skin: warm and dry  Neuro:  CNs 2-12 intact, no focal abnormalities noted Psych:  Normal affect   EKG:  The EKG was personally reviewed and demonstrates: Atrial fibrillation with rapid ventricular  response Telemetry:  Telemetry was personally reviewed and demonstrates: Atrial fibrillation  Relevant CV Studies:  ECHO 04-21-19  1. Left ventricular ejection fraction, by  visual estimation, is 30 to  35%. The left ventricle has severely decreased function. There is no left  ventricular hypertrophy.   2. Left ventricular diastolic parameters are consistent with Grade II  diastolic dysfunction (pseudonormalization).   3. The left ventricle demonstrates global hypokinesis.   4. Global right ventricle has normal systolic function.The right  ventricular size is normal. No increase in right ventricular wall  thickness.   5. Left atrial size was normal.   6. Right atrial size was severely dilated.   7. The mitral valve is normal in structure. No evidence of mitral valve  regurgitation. No evidence of mitral stenosis.   8. MV repair, mean gradient .   9. The tricuspid valve is normal in structure.  10. Aortic valve regurgitation is moderate.  11. The aortic valve is tricuspid. Aortic valve regurgitation is moderate.  No evidence of aortic valve sclerosis or stenosis.  12. The pulmonic valve was normal in structure. Pulmonic valve  regurgitation is not visualized.  13. The inferior vena cava is normal in size with greater than 50%  respiratory variability, suggesting right atrial pressure of 3 mmHg.   Cardiac catheterization 2017-normal coronary arteries Laboratory Data:  High Sensitivity Troponin:   Recent Labs  Lab 10/15/21 0929 10/15/21 1135  TROPONINIHS 13 10     Chemistry Recent Labs  Lab 10/15/21 0928 10/15/21 0929  NA 134*  --   K 4.1  --   CL 101  --   CO2 25  --   GLUCOSE 110*  --   BUN 15  --   CREATININE 0.69  --   CALCIUM 10.3  --   MG  --  2.0  GFRNONAA >60  --   ANIONGAP 8  --     Recent Labs  Lab 10/15/21 0928  PROT 8.1  ALBUMIN 4.1  AST 17  ALT 10  ALKPHOS 53  BILITOT 1.3*   Lipids No results for input(s): "CHOL", "TRIG", "HDL",  "LABVLDL", "LDLCALC", "CHOLHDL" in the last 168 hours.  Hematology Recent Labs  Lab 10/15/21 0928  WBC 9.0  RBC 4.77  HGB 14.3  HCT 43.3  MCV 90.8  MCH 30.0  MCHC 33.0  RDW 13.4  PLT 246   Thyroid  Recent Labs  Lab 10/15/21 0929 10/15/21 1037  TSH 0.238*  --   FREET4  --  0.96    BNPNo results for input(s): "BNP", "PROBNP" in the last 168 hours.  DDimer No results for input(s): "DDIMER" in the last 168 hours.   Radiology/Studies:  CT ABDOMEN PELVIS W CONTRAST  Result Date: 10/15/2021 CLINICAL DATA:  Pyelonephritis, uncomplicated. EXAM: CT ABDOMEN AND PELVIS WITH CONTRAST TECHNIQUE: Multidetector CT imaging of the abdomen and pelvis was performed using the standard protocol following bolus administration of intravenous contrast. RADIATION DOSE REDUCTION: This exam was performed according to the departmental dose-optimization program which includes automated exposure control, adjustment of the mA and/or kV according to patient size and/or use of iterative reconstruction technique. CONTRAST:  OMNIPAQUE IOHEXOL 300 MG/ML  SOLN COMPARISON:  CT June 23, 2019 FINDINGS: Lower chest: Bibasilar atelectasis versus scarring. Aortic atherosclerosis. Hepatobiliary: No suspicious hepatic lesion. Gallbladder is unremarkable. No biliary ductal dilation. Pancreas: No pancreatic ductal dilation or evidence of acute inflammation. Spleen: No splenomegaly or focal splenic lesion. Adrenals/Urinary Tract: Bilateral adrenal glands appear normal. No hydronephrosis. Kidneys demonstrate symmetric enhancement and excretion of contrast material. Hypodense 11 mm left lower pole renal cyst is considered benign and requires no independent imaging follow-up.  Urinary bladder is unremarkable for degree of distension. Stomach/Bowel: No radiopaque enteric contrast material was administered. Stomach is nondistended limiting evaluation. No pathologic dilation of small or large bowel. The appendix is not confidently  identified however there is no pericecal inflammation. Terminal ileum appears normal. No evidence of acute bowel inflammation. Colonic diverticulosis without findings of acute diverticulitis. Vascular/Lymphatic: Aortic and branch vessel atherosclerosis without abdominal aortic aneurysm. Prominence and early filling of the bilateral gonadal veins and pelvic collateral vessels. No pathologically enlarged abdominal or pelvic lymph nodes. Reproductive: Uterus and bilateral adnexa are unremarkable. Other: No significant abdominopelvic free fluid. Musculoskeletal: Similar remote superior compression deformity of the L3 vertebral body. Diffuse demineralization of bone. No acute osseous abnormality. IMPRESSION: 1. No acute abnormality in the abdomen or pelvis, specifically no evidence of obstructive uropathy. 2. Colonic diverticulosis without findings of acute diverticulitis. 3. Prominence and early filling of the bilateral gonadal veins and pelvic collateral vessels as can be seen with pelvic congestion syndrome. 4.  Aortic Atherosclerosis (ICD10-I70.0). Electronically Signed   By: Dahlia Bailiff M.D.   On: 10/15/2021 11:30   DG Chest Port 1 View  Result Date: 10/15/2021 CLINICAL DATA:  Weakness. EXAM: PORTABLE CHEST 1 VIEW COMPARISON:  06/23/2019 FINDINGS: 0917 hours. Rotated film. Lungs are hyperexpanded. The lungs are clear without focal pneumonia, edema, pneumothorax or pleural effusion. The cardio pericardial silhouette is enlarged. Bones are diffusely demineralized. Telemetry leads overlie the chest. IMPRESSION: Hyperexpansion without acute cardiopulmonary findings. Electronically Signed   By: Misty Stanley M.D.   On: 10/15/2021 09:28     Assessment and Plan:   Paroxysmal atrial fibrillation - Status post Maze procedure in 2014 with left atrial appendage clipping during mitral valve repair. - Had been on sotalol in the past. -Was told to stop Xarelto because of hematuria -I am unsure how long she has  been in atrial fibrillation.  Her daughter helped clarify some history and did go to Dr. Jacquiline Doe last appointment maybe 6 to 10 months ago.  She remembers him saying something about maybe her being in atrial fibrillation but it was under good rate control.  She is unsure.  I am unsure whether or not she has been taking sotalol at home.  The patient is very confused at this time. - Lets go ahead and start first with metoprolol.  I will give her 50 mg tartrate twice daily.  Hopefully this will help with rate control. -May not restart sotalol.  Like I said at this time we may just be promoting rate control and not worrying about rhythm control.  Nonischemic cardiomyopathy, chronic systolic heart failure - EF has fluctuated in the past.  Most recently it was in the 30 to 35% range. Recheck ECHO. -Would benefit from goal-directed medical therapy which would include Toprol or beta-blocker, ARN I, spironolactone, SGLT2 inhibitor.  In the past in review of notes, she has had some trouble with beta-blockers in the past -  fatigue.  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  Used to be on methimazole.  Question when this stopped.  PVCs - Fairly frequent in the past.  Did not tolerate mexiletine.  We will utilize metoprolol.   For questions or updates, please contact [place] Please consult [email or web address] for contact info under    Signed, Candee Furbish, MD  10/15/2021 1:57 PM

## 2021-10-16 ENCOUNTER — Inpatient Hospital Stay (HOSPITAL_COMMUNITY): Payer: PPO

## 2021-10-16 DIAGNOSIS — I4891 Unspecified atrial fibrillation: Secondary | ICD-10-CM

## 2021-10-16 DIAGNOSIS — L8990 Pressure ulcer of unspecified site, unspecified stage: Secondary | ICD-10-CM | POA: Insufficient documentation

## 2021-10-16 LAB — URINE CULTURE: Culture: NO GROWTH

## 2021-10-16 LAB — COMPREHENSIVE METABOLIC PANEL
ALT: 9 U/L (ref 0–44)
AST: 16 U/L (ref 15–41)
Albumin: 3.7 g/dL (ref 3.5–5.0)
Alkaline Phosphatase: 47 U/L (ref 38–126)
Anion gap: 8 (ref 5–15)
BUN: 16 mg/dL (ref 8–23)
CO2: 26 mmol/L (ref 22–32)
Calcium: 10.3 mg/dL (ref 8.9–10.3)
Chloride: 103 mmol/L (ref 98–111)
Creatinine, Ser: 0.77 mg/dL (ref 0.44–1.00)
GFR, Estimated: >60 mL/min (ref >60)
Glucose, Bld: 111 mg/dL — ABNORMAL HIGH (ref 70–99)
Potassium: 4.1 mmol/L (ref 3.5–5.1)
Sodium: 137 mmol/L (ref 135–145)
Total Bilirubin: 1.2 mg/dL (ref 0.3–1.2)
Total Protein: 7 g/dL (ref 6.5–8.1)

## 2021-10-16 LAB — ECHOCARDIOGRAM COMPLETE
Height: 66 in
S' Lateral: 3.9 cm
Weight: 1516.76 oz

## 2021-10-16 LAB — CBC
HCT: 39.6 % (ref 36.0–46.0)
Hemoglobin: 13.2 g/dL (ref 12.0–15.0)
MCH: 30.1 pg (ref 26.0–34.0)
MCHC: 33.3 g/dL (ref 30.0–36.0)
MCV: 90.2 fL (ref 80.0–100.0)
Platelets: 258 10*3/uL (ref 150–400)
RBC: 4.39 MIL/uL (ref 3.87–5.11)
RDW: 13.4 % (ref 11.5–15.5)
WBC: 12.1 10*3/uL — ABNORMAL HIGH (ref 4.0–10.5)
nRBC: 0 % (ref 0.0–0.2)

## 2021-10-16 LAB — T3, FREE: T3, Free: 2.7 pg/mL (ref 2.0–4.4)

## 2021-10-16 MED ORDER — METOPROLOL TARTRATE 50 MG PO TABS
50.0000 mg | ORAL_TABLET | Freq: Four times a day (QID) | ORAL | Status: DC
Start: 2021-10-16 — End: 2021-10-17
  Administered 2021-10-16 (×3): 50 mg via ORAL
  Filled 2021-10-16 (×3): qty 1

## 2021-10-16 MED ORDER — ALPRAZOLAM 0.25 MG PO TABS
0.2500 mg | ORAL_TABLET | Freq: Every day | ORAL | Status: DC
  Administered 2021-10-17 – 2021-10-20 (×5): 0.25 mg via ORAL
  Filled 2021-10-16 (×5): qty 1

## 2021-10-16 MED ORDER — ADULT MULTIVITAMIN W/MINERALS CH
1.0000 | ORAL_TABLET | Freq: Every day | ORAL | Status: DC
  Administered 2021-10-16 – 2021-10-26 (×9): 1 via ORAL
  Filled 2021-10-16 (×10): qty 1

## 2021-10-17 DIAGNOSIS — I4891 Unspecified atrial fibrillation: Secondary | ICD-10-CM | POA: Diagnosis not present

## 2021-10-17 LAB — T3, FREE: T3, Free: 2.3 pg/mL (ref 2.0–4.4)

## 2021-10-17 MED ORDER — ENOXAPARIN SODIUM 40 MG/0.4ML IJ SOSY
40.0000 mg | PREFILLED_SYRINGE | INTRAMUSCULAR | Status: DC
Start: 2021-10-17 — End: 2021-10-17

## 2021-10-17 MED ORDER — SOTALOL HCL 80 MG PO TABS
80.0000 mg | ORAL_TABLET | Freq: Two times a day (BID) | ORAL | Status: DC
  Administered 2021-10-17 – 2021-10-24 (×15): 80 mg via ORAL
  Filled 2021-10-17 (×16): qty 1

## 2021-10-17 MED ORDER — METOPROLOL TARTRATE 50 MG PO TABS
100.0000 mg | ORAL_TABLET | Freq: Four times a day (QID) | ORAL | Status: DC
  Administered 2021-10-17: 100 mg via ORAL
  Filled 2021-10-17: qty 2

## 2021-10-17 MED ORDER — METOPROLOL TARTRATE 50 MG PO TABS
50.0000 mg | ORAL_TABLET | Freq: Four times a day (QID) | ORAL | Status: DC
  Administered 2021-10-17 – 2021-10-19 (×9): 50 mg via ORAL
  Filled 2021-10-17 (×11): qty 1

## 2021-10-17 NOTE — Progress Notes (Addendum)
Progress Note  Patient Name: [name] [name] Date of Encounter: 10/17/2021  [facility] Cardiologist: Dietrich Pates, MD   Subjective   Atrial fibrillation is still rapid despite higher dosing of metoprolol.  We will continue to increase.  Inpatient Medications    Scheduled Meds:  ALPRAZolam  0.25 mg Oral QHS   feeding supplement  237 mL Oral BID BM   losartan  25 mg Oral Daily   metoprolol tartrate  50 mg Oral Q6H   multivitamin with minerals  1 tablet Oral Daily   Rivaroxaban  15 mg Oral Q supper   Continuous Infusions:  PRN Meds: acetaminophen **OR** acetaminophen, HYDROcodone-acetaminophen, lip balm, LORazepam, metoprolol tartrate, ondansetron **OR** ondansetron (ZOFRAN) IV   Vital Signs    Vitals:   10/17/21 0100 10/17/21 0200 10/17/21 0300 10/17/21 0400  BP:      Pulse:      Resp: (!) 25 17 (!) 27 20  Temp:      TempSrc:      SpO2:      Weight:      Height:        Intake/Output Summary (Last 24 hours) at 10/17/2021 0835 Last data filed at 10/17/2021 0600 Gross per 24 hour  Intake 350 ml  Output --  Net 350 ml      10/15/2021    8:45 AM 09/17/2019    3:17 PM 07/05/2019    2:27 PM  Last 3 Weights  Weight (lbs) 94 lb 12.8 oz 92 lb 9.6 oz 90 lb  Weight (kg) 43 kg 42.003 kg 40.824 kg      Telemetry    A-fib 130-140 Personally Reviewed  ECG    No new- Personally Reviewed  Physical Exam   GEN: Resting on left side in bed, thin, ill-appearing Neck: No JVD Cardiac: Tachycardic irregular, no murmurs, rubs, or gallops.  Respiratory: Clear to auscultation bilaterally. GI: Soft, nontender, non-distended  MS: No edema; No deformity.  Sacral patch dressing noted Neuro:  Nonfocal  Psych: Unable  Labs    High Sensitivity Troponin:   Recent Labs  Lab 10/15/21 0929 10/15/21 1135  TROPONINIHS 13 10     Chemistry Recent Labs  Lab 10/15/21 0928 10/15/21 0929 10/16/21 0526  NA 134*  --  137  K 4.1  --  4.1  CL 101  --  103  CO2 25  --  26   GLUCOSE 110*  --  111*  BUN 15  --  16  CREATININE 0.69  --  0.77  CALCIUM 10.3  --  10.3  MG  --  2.0  --   PROT 8.1  --  7.0  ALBUMIN 4.1  --  3.7  AST 17  --  16  ALT 10  --  9  ALKPHOS 53  --  47  BILITOT 1.3*  --  1.2  GFRNONAA >60  --  >60  ANIONGAP 8  --  8    Lipids No results for input(s): "CHOL", "TRIG", "HDL", "LABVLDL", "LDLCALC", "CHOLHDL" in the last 168 hours.  Hematology Recent Labs  Lab 10/15/21 0928 10/16/21 0526  WBC 9.0 12.1*  RBC 4.77 4.39  HGB 14.3 13.2  HCT 43.3 39.6  MCV 90.8 90.2  MCH 30.0 30.1  MCHC 33.0 33.3  RDW 13.4 13.4  PLT 246 258   Thyroid  Recent Labs  Lab 10/15/21 0929 10/15/21 1037  TSH 0.238*  --   FREET4  --  0.96    BNPNo results for  input(s): "BNP", "PROBNP" in the last 168 hours.  DDimer No results for input(s): "DDIMER" in the last 168 hours.   Radiology    ECHOCARDIOGRAM COMPLETE  Result Date: 10/16/2021    ECHOCARDIOGRAM REPORT   Patient Name:   [name] [name] Date of Exam: 10/16/2021 Medical Rec #:  [id number]         Height:       66.0 in Accession #:    [id number]        Weight:       94.8 lb Date of Birth:  1940-12-29         BSA:          1.456 m Patient Age:    80 years          BP:           149/86 mmHg Patient Gender: F                 HR:           135 bpm. Exam Location:  Inpatient Procedure: 2D Echo, Cardiac Doppler and Color Doppler Indications:    AFIB  History:        Patient has prior history of Echocardiogram examinations, most                 recent 04/21/2019. Arrythmias:Atrial Fibrillation; Risk                 Factors:Hypertension.  Sonographer:    Rodrigo Ran RCS Referring Phys: [id number] Teddy Spike  Sonographer Comments: Technically challenging study due to limited acoustic windows. Image acquisition challenging due to patient body habitus and Image acquisition challenging due to uncooperative patient. IMPRESSIONS  1. Left ventricular ejection fraction, by estimation, is 40 to 45%. The left  ventricle has mildly decreased function. The left ventricle demonstrates regional wall motion abnormalities (see scoring diagram/findings for description). Left ventricular diastolic function could not be evaluated. There is severe hypokinesis of the left ventricular, entire anteroseptal wall.  2. Right ventricular systolic function is normal. The right ventricular size is small.  3. Left atrial size was mildly dilated.  4. The mitral valve is grossly normal. Mild mitral valve regurgitation.  5. Calcification of the non coronary cusp. . The aortic valve is calcified. Aortic valve regurgitation is mild.  6. The inferior vena cava is normal in size with greater than 50% respiratory variability, suggesting right atrial pressure of 3 mmHg. FINDINGS  Left Ventricle: Left ventricular ejection fraction, by estimation, is 40 to 45%. The left ventricle has mildly decreased function. The left ventricle demonstrates regional wall motion abnormalities. Severe hypokinesis of the left ventricular, entire anteroseptal wall. The left ventricular internal cavity size was normal in size. There is no left ventricular hypertrophy. Left ventricular diastolic function could not be evaluated due to atrial fibrillation. Left ventricular diastolic function could not be evaluated. Right Ventricle: The right ventricular size is small. Right vetricular wall thickness was not well visualized. Right ventricular systolic function is normal. Left Atrium: Left atrial size was mildly dilated. Right Atrium: Right atrial size was normal in size. Pericardium: There is no evidence of pericardial effusion. Mitral Valve: The mitral valve is grossly normal. Mild mitral valve regurgitation. Tricuspid Valve: The tricuspid valve is normal in structure. Tricuspid valve regurgitation is trivial. Aortic Valve: Calcification of the non coronary cusp. The aortic valve is calcified. Aortic valve regurgitation is mild. Pulmonic Valve: The pulmonic valve was normal  in structure. Pulmonic valve regurgitation  is trivial. Aorta: The aortic root and ascending aorta are structurally normal, with no evidence of dilitation. Venous: The inferior vena cava is normal in size with greater than 50% respiratory variability, suggesting right atrial pressure of 3 mmHg. IAS/Shunts: The interatrial septum was not well visualized.  LEFT VENTRICLE PLAX 2D LVIDd:         4.20 cm   Diastology LVIDs:         3.90 cm   LV e' lateral: 7.07 cm/s LV PW:         0.90 cm LV IVS:        1.00 cm LVOT diam:     1.90 cm LVOT Area:     2.84 cm  IVC IVC diam: 1.80 cm LEFT ATRIUM             Index        RIGHT ATRIUM           Index LA diam:        5.30 cm 3.64 cm/m   RA Area:     15.90 cm LA Vol (A2C):   33.9 ml 23.28 ml/m  RA Volume:   40.00 ml  27.47 ml/m LA Vol (A4C):   60.3 ml 41.41 ml/m LA Biplane Vol: 49.0 ml 33.65 ml/m                        PULMONIC VALVE AORTA                 PV Vmax:          0.68 m/s Ao Root diam: 3.20 cm PV Peak grad:     1.8 mmHg Ao Asc diam:  3.40 cm PR End Diast Vel: 13.10 msec  TRICUSPID VALVE TR Peak grad:   27.5 mmHg TR Vmax:        262.00 cm/s  SHUNTS Systemic Diam: 1.90 cm Kristeen Miss MD Electronically signed by Kristeen Miss MD Signature Date/Time: 10/16/2021/11:45:10 AM    Final    CT ABDOMEN PELVIS W CONTRAST  Result Date: 10/15/2021 CLINICAL DATA:  Pyelonephritis, uncomplicated. EXAM: CT ABDOMEN AND PELVIS WITH CONTRAST TECHNIQUE: Multidetector CT imaging of the abdomen and pelvis was performed using the standard protocol following bolus administration of intravenous contrast. RADIATION DOSE REDUCTION: This exam was performed according to the departmental dose-optimization program which includes automated exposure control, adjustment of the mA and/or kV according to patient size and/or use of iterative reconstruction technique. CONTRAST:  OMNIPAQUE IOHEXOL 300 MG/ML  SOLN COMPARISON:  CT June 23, 2019 FINDINGS: Lower chest: Bibasilar atelectasis  versus scarring. Aortic atherosclerosis. Hepatobiliary: No suspicious hepatic lesion. Gallbladder is unremarkable. No biliary ductal dilation. Pancreas: No pancreatic ductal dilation or evidence of acute inflammation. Spleen: No splenomegaly or focal splenic lesion. Adrenals/Urinary Tract: Bilateral adrenal glands appear normal. No hydronephrosis. Kidneys demonstrate symmetric enhancement and excretion of contrast material. Hypodense 11 mm left lower pole renal cyst is considered benign and requires no independent imaging follow-up. Urinary bladder is unremarkable for degree of distension. Stomach/Bowel: No radiopaque enteric contrast material was administered. Stomach is nondistended limiting evaluation. No pathologic dilation of small or large bowel. The appendix is not confidently identified however there is no pericecal inflammation. Terminal ileum appears normal. No evidence of acute bowel inflammation. Colonic diverticulosis without findings of acute diverticulitis. Vascular/Lymphatic: Aortic and branch vessel atherosclerosis without abdominal aortic aneurysm. Prominence and early filling of the bilateral gonadal veins and pelvic collateral vessels. No pathologically enlarged abdominal  or pelvic lymph nodes. Reproductive: Uterus and bilateral adnexa are unremarkable. Other: No significant abdominopelvic free fluid. Musculoskeletal: Similar remote superior compression deformity of the L3 vertebral body. Diffuse demineralization of bone. No acute osseous abnormality. IMPRESSION: 1. No acute abnormality in the abdomen or pelvis, specifically no evidence of obstructive uropathy. 2. Colonic diverticulosis without findings of acute diverticulitis. 3. Prominence and early filling of the bilateral gonadal veins and pelvic collateral vessels as can be seen with pelvic congestion syndrome. 4.  Aortic Atherosclerosis (ICD10-I70.0). Electronically Signed   By: Maudry Mayhew M.D.   On: 10/15/2021 11:30   DG Chest Port 1  View  Result Date: 10/15/2021 CLINICAL DATA:  Weakness. EXAM: PORTABLE CHEST 1 VIEW COMPARISON:  06/23/2019 FINDINGS: 0917 hours. Rotated film. Lungs are hyperexpanded. The lungs are clear without focal pneumonia, edema, pneumothorax or pleural effusion. The cardio pericardial silhouette is enlarged. Bones are diffusely demineralized. Telemetry leads overlie the chest. IMPRESSION: Hyperexpansion without acute cardiopulmonary findings. Electronically Signed   By: Kennith Center M.D.   On: 10/15/2021 09:28    Cardiac Studies   ECHO 2020 - 30%.  Echocardiogram 10/16/2021: 1. Left ventricular ejection fraction, by estimation, is 40 to 45%. The  left ventricle has mildly decreased function. The left ventricle  demonstrates regional wall motion abnormalities (see scoring  diagram/findings for description). Left ventricular  diastolic function could not be evaluated. There is severe hypokinesis of  the left ventricular, entire anteroseptal wall.   2. Right ventricular systolic function is normal. The right ventricular  size is small.   3. Left atrial size was mildly dilated.   4. The mitral valve is grossly normal. Mild mitral valve regurgitation.   5. Calcification of the non coronary cusp. . The aortic valve is  calcified. Aortic valve regurgitation is mild.   6. The inferior vena cava is normal in size with greater than 50%  respiratory variability, suggesting right atrial pressure of 3 mmHg.  Patient Profile     81 y.o. 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  Recent Augmentin start for abnormal urine.  In emergency room was very somnolent and confused  Assessment & Plan    Paroxysmal atrial fibrillation -Since we are unsure how long she has been in atrial fibrillation, I think it makes sense for [place] to  utilize metoprolol first for rate control.  I will increase to 50 mg every 6 hours.  Currently in the 130s.  I am unsure that rhythm control may be an option at this point hence may not need sotalol.  Difficult to completely clarify history.  Daughter was helpful on admission. -Still challenging to control however.  Unable to utilize amiodarone because of prior hyperthyroidism.  With reduced ejection fraction, options are limited in antiarrhythmic.  She does not appear to be a good candidate for dofetilide.  I will increase metoprolol to tartrate to 100 mg every 6 hours with hold parameters. - Remember, she is post Maze procedure in 2014. -Unfortunately she has been off of her Xarelto at home because of hematuria.  We cannot safely cardiovert unless hemodynamically unstable without the use of transesophageal echocardiogram and in her current state I do not think she is a good candidate for invasive procedure like TEE.  Chronic anticoagulation - Was told previously to stop Xarelto because of hematuria.  Resumed here, dose adjusted.  She  does have left atrial appendage clipping.  Nonischemic cardiomyopathy with chronic systolic heart failure - Most recently EF in the 40-45 % range.  Previously in the 30s.   -Would benefit from goal-directed medical therapy which would include Toprol or beta-blocker, ARN I, spironolactone, SGLT2 inhibitor.  In the past in review of notes, she has had some trouble with beta-blockers in the past -  fatigue.  Unfortunately our choices are limited.  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  Used to be on methimazole.  Question when this stopped. -TSH was 0.23 on admission however free T4 was 0.96 in the normal range.   PVCs - Fairly frequent in the past.  Did not tolerate mexiletine.  We will utilize metoprolol.    For questions or updates, please contact [facility] Please consult [email or web address] for contact  info under        Signed, Donato Schultz, MD  10/17/2021, 8:35 AM

## 2021-10-18 DIAGNOSIS — I4891 Unspecified atrial fibrillation: Secondary | ICD-10-CM | POA: Diagnosis not present

## 2021-10-18 LAB — CBC WITH DIFFERENTIAL/PLATELET
Abs Immature Granulocytes: 0.04 10*3/uL (ref 0.00–0.07)
Basophils Absolute: 0.1 10*3/uL (ref 0.0–0.1)
Basophils Relative: 1 %
Eosinophils Absolute: 0.2 10*3/uL (ref 0.0–0.5)
Eosinophils Relative: 2 %
HCT: 40.3 % (ref 36.0–46.0)
Hemoglobin: 13.2 g/dL (ref 12.0–15.0)
Immature Granulocytes: 0 %
Lymphocytes Relative: 25 %
Lymphs Abs: 2.6 10*3/uL (ref 0.7–4.0)
MCH: 29.9 pg (ref 26.0–34.0)
MCHC: 32.8 g/dL (ref 30.0–36.0)
MCV: 91.4 fL (ref 80.0–100.0)
Monocytes Absolute: 1 10*3/uL (ref 0.1–1.0)
Monocytes Relative: 10 %
Neutro Abs: 6.5 10*3/uL (ref 1.7–7.7)
Neutrophils Relative %: 62 %
Platelets: 258 10*3/uL (ref 150–400)
RBC: 4.41 MIL/uL (ref 3.87–5.11)
RDW: 14 % (ref 11.5–15.5)
WBC: 10.4 10*3/uL (ref 4.0–10.5)
nRBC: 0 % (ref 0.0–0.2)

## 2021-10-18 LAB — BASIC METABOLIC PANEL
Anion gap: 6 (ref 5–15)
BUN: 25 mg/dL — ABNORMAL HIGH (ref 8–23)
CO2: 28 mmol/L (ref 22–32)
Calcium: 10.3 mg/dL (ref 8.9–10.3)
Chloride: 105 mmol/L (ref 98–111)
Creatinine, Ser: 0.99 mg/dL (ref 0.44–1.00)
GFR, Estimated: 58 mL/min — ABNORMAL LOW (ref >60)
Glucose, Bld: 118 mg/dL — ABNORMAL HIGH (ref 70–99)
Potassium: 4 mmol/L (ref 3.5–5.1)
Sodium: 139 mmol/L (ref 135–145)

## 2021-10-18 MED ORDER — ORAL CARE MOUTH RINSE: 15.0000 mL | OROMUCOSAL | Status: DC | PRN

## 2021-10-19 ENCOUNTER — Inpatient Hospital Stay (HOSPITAL_COMMUNITY): Payer: PPO

## 2021-10-19 ENCOUNTER — Inpatient Hospital Stay (HOSPITAL_COMMUNITY): Payer: PPO | Admitting: Certified Registered Nurse Anesthetist

## 2021-10-19 ENCOUNTER — Encounter (HOSPITAL_COMMUNITY): Payer: Self-pay | Admitting: Internal Medicine

## 2021-10-19 ENCOUNTER — Encounter (HOSPITAL_COMMUNITY)
Admission: EM | Disposition: A | Discharge status: Skilled Nursing Facility | Payer: Self-pay | Source: Home / Self Care | Attending: Family Medicine

## 2021-10-19 DIAGNOSIS — I110 Hypertensive heart disease with heart failure: Secondary | ICD-10-CM | POA: Diagnosis not present

## 2021-10-19 DIAGNOSIS — I509 Heart failure, unspecified: Secondary | ICD-10-CM | POA: Diagnosis not present

## 2021-10-19 DIAGNOSIS — I4891 Unspecified atrial fibrillation: Secondary | ICD-10-CM | POA: Diagnosis not present

## 2021-10-19 DIAGNOSIS — I2510 Atherosclerotic heart disease of native coronary artery without angina pectoris: Secondary | ICD-10-CM

## 2021-10-19 DIAGNOSIS — I340 Nonrheumatic mitral (valve) insufficiency: Secondary | ICD-10-CM

## 2021-10-19 DIAGNOSIS — I10 Essential (primary) hypertension: Secondary | ICD-10-CM | POA: Diagnosis not present

## 2021-10-19 DIAGNOSIS — G9341 Metabolic encephalopathy: Secondary | ICD-10-CM

## 2021-10-19 HISTORY — PX: TEE WITHOUT CARDIOVERSION: SHX5443

## 2021-10-19 HISTORY — PX: CARDIOVERSION: SHX1299

## 2021-10-19 SURGERY — ECHOCARDIOGRAM, TRANSESOPHAGEAL
Anesthesia: Monitor Anesthesia Care

## 2021-10-19 MED ORDER — SODIUM CHLORIDE 0.9 % IV SOLN: INTRAVENOUS | Status: DC

## 2021-10-19 MED ORDER — PROPOFOL 10 MG/ML IV BOLUS
INTRAVENOUS | Status: DC | PRN
  Administered 2021-10-19 (×3): 20 mg via INTRAVENOUS
  Administered 2021-10-19: 15 mg via INTRAVENOUS

## 2021-10-19 MED ORDER — PERFLUTREN LIPID MICROSPHERE: 1.0000 mL | INTRAVENOUS | Status: AC | PRN

## 2021-10-19 MED ORDER — PHENYLEPHRINE 80 MCG/ML (10ML) SYRINGE FOR IV PUSH (FOR BLOOD PRESSURE SUPPORT)
PREFILLED_SYRINGE | INTRAVENOUS | Status: DC | PRN
  Administered 2021-10-19: 40 ug via INTRAVENOUS

## 2021-10-19 MED ORDER — LIDOCAINE 2% (20 MG/ML) 5 ML SYRINGE
INTRAMUSCULAR | Status: DC | PRN
  Administered 2021-10-19: 40 mg via INTRAVENOUS

## 2021-10-19 MED ORDER — HYDROCORTISONE 1 % EX CREA
1.0000 | TOPICAL_CREAM | Freq: Three times a day (TID) | CUTANEOUS | Status: DC | PRN
  Filled 2021-10-19: qty 28

## 2021-10-19 MED ORDER — PERFLUTREN LIPID MICROSPHERE
INTRAVENOUS | Status: DC | PRN
  Administered 2021-10-19: 2 mL via INTRAVENOUS

## 2021-10-19 MED ORDER — PROPOFOL 500 MG/50ML IV EMUL
INTRAVENOUS | Status: DC | PRN
  Administered 2021-10-19: 100 ug/kg/min via INTRAVENOUS

## 2021-10-19 NOTE — Progress Notes (Signed)
Progress Note  Patient Name: [name] [name] Date of Encounter: 10/19/2021  [facility] Cardiologist: Dietrich Pates, MD   Subjective   Pt willing to do the TEE/DCCV if her daughter, Marylene Land, agrees.  She denies chest pain or SOB. She seemed reassured by my description of the procedure.  Inpatient Medications    Scheduled Meds:  ALPRAZolam  0.25 mg Oral QHS   feeding supplement  237 mL Oral BID BM   losartan  25 mg Oral Daily   metoprolol tartrate  50 mg Oral Q6H   multivitamin with minerals  1 tablet Oral Daily   Rivaroxaban  15 mg Oral Q supper   sotalol  80 mg Oral Q12H   Continuous Infusions:  PRN Meds: acetaminophen **OR** acetaminophen, HYDROcodone-acetaminophen, lip balm, LORazepam, metoprolol tartrate, ondansetron **OR** ondansetron (ZOFRAN) IV, mouth rinse   Vital Signs    Vitals:   10/18/21 1800 10/18/21 1900 10/18/21 2000 10/19/21 0300  BP: 113/70 (!) 110/97 118/70 129/84  Pulse: (!) 136 (!) 132 (!) 137 (!) 121  Resp: 15 18 (!) 23 (!) 22  Temp:    97.8 F (36.6 C)  TempSrc:    Oral  SpO2: 94% 97% 96% 98%  Weight:      Height:        Intake/Output Summary (Last 24 hours) at 10/19/2021 0805 Last data filed at 10/19/2021 0500 Gross per 24 hour  Intake 300 ml  Output --  Net 300 ml      10/15/2021    8:45 AM 09/17/2019    3:17 PM 07/05/2019    2:27 PM  Last 3 Weights  Weight (lbs) 94 lb 12.8 oz 92 lb 9.6 oz 90 lb  Weight (kg) 43 kg 42.003 kg 40.824 kg      Telemetry    Atrial flutter, rate elevated, currently in the 120s with PVCs - Reviewed  ECG    6/26 ECG is atrial flutter, heart rate 121, PVCs- Personally Reviewed  Physical Exam   General: frail, elderly, female in no acute distress Head: Eyes PERRLA, Head normocephalic and atraumatic Lungs: clear bilaterally to auscultation. Heart: Irreg R&R, S1 S2, without rub or gallop. No murmur. 4/4 extremity pulses are 2+ & equal. No JVD. Abdomen: Bowel sounds are present, abdomen soft and  non-tender without masses or  hernias noted. Msk: Very weak strength and tone for age. Extremities: No clubbing, cyanosis or edema.    Skin:  No rashes or lesions noted. Neuro: Alert and oriented X 2. Psych:  Good affect, responds appropriately  Labs    High Sensitivity Troponin:   Recent Labs  Lab 10/15/21 0929 10/15/21 1135  TROPONINIHS 13 10     Chemistry Recent Labs  Lab 10/15/21 0928 10/15/21 0929 10/16/21 0526 10/18/21 0733  NA 134*  --  137 139  K 4.1  --  4.1 4.0  CL 101  --  103 105  CO2 25  --  26 28  GLUCOSE 110*  --  111* 118*  BUN 15  --  16 25*  CREATININE 0.69  --  0.77 0.99  CALCIUM 10.3  --  10.3 10.3  MG  --  2.0  --   --   PROT 8.1  --  7.0  --   ALBUMIN 4.1  --  3.7  --   AST 17  --  16  --   ALT 10  --  9  --   ALKPHOS 53  --  47  --  BILITOT 1.3*  --  1.2  --   GFRNONAA >60  --  >60 58*  ANIONGAP 8  --  8 6    Lipids No results for input(s): "CHOL", "TRIG", "HDL", "LABVLDL", "LDLCALC", "CHOLHDL" in the last 168 hours.  Hematology Recent Labs  Lab 10/15/21 0928 10/16/21 0526 10/18/21 0733  WBC 9.0 12.1* 10.4  RBC 4.77 4.39 4.41  HGB 14.3 13.2 13.2  HCT 43.3 39.6 40.3  MCV 90.8 90.2 91.4  MCH 30.0 30.1 29.9  MCHC 33.0 33.3 32.8  RDW 13.4 13.4 14.0  PLT 246 258 258   Thyroid  Recent Labs  Lab 10/15/21 0929 10/15/21 1037  TSH 0.238*  --   FREET4  --  0.96    BNPNo results for input(s): "BNP", "PROBNP" in the last 168 hours.  DDimer No results for input(s): "DDIMER" in the last 168 hours.   Radiology    No results found.  Cardiac Studies   ECHO 2020 - 30%.  Echocardiogram 10/16/2021: 1. Left ventricular ejection fraction, by estimation, is 40 to 45%. The  left ventricle has mildly decreased function. The left ventricle  demonstrates regional wall motion abnormalities (see scoring  diagram/findings for description). Left ventricular  diastolic function could not be evaluated. There is severe hypokinesis of  the left  ventricular, entire anteroseptal wall.   2. Right ventricular systolic function is normal. The right ventricular  size is small.   3. Left atrial size was mildly dilated.   4. The mitral valve is grossly normal. Mild mitral valve regurgitation.   5. Calcification of the non coronary cusp. . The aortic valve is  calcified. Aortic valve regurgitation is mild.   6. The inferior vena cava is normal in size with greater than 50%  respiratory variability, suggesting right atrial pressure of 3 mmHg.  Patient Profile     81 y.o. 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  Recent Augmentin start for abnormal urine.  In emergency room was very somnolent and confused  Assessment & Plan    Persistent atrial fibrillation -Heart rate not controlled despite metoprolol 50 mg every 6 hours and sotalol 80 mg twice daily, no doses missed -  Unable to utilize amiodarone because of prior amiodarone induced hyperthyroidism.  With reduced ejection fraction EF 40 to 45%, options are limited in antiarrhythmic.  She d is not a candidate for dofetilide.  - She is post Maze procedure in 2014. -Unfortunately she was off of her Xarelto at home because of hematuria.  -Xarelto has been restarted for 4 days -Although she is not an ideal candidate for TEE, Dr. Anne Fu discussed the options with her son, and I reviewed the procedure with her daughter.  The patient is agreeable as long as her daughter was agreeable. - Theoretically with her left atrial appendage clipping, we could try isolated cardioversion but still there would be some risk of stroke without imaging. - Therefore, will pursue TEE cardioversion, she is on the schedule for today  Chronic anticoagulation - Was told previously to stop Xarelto because of hematuria.  Resumed here,  dose adjusted.  She does have left atrial appendage clipping.  Nonischemic cardiomyopathy with chronic systolic heart failure - Most recently EF in the 40-45 % range.  Previously in the 30s.   -Would benefit from goal-directed medical therapy which would include Toprol or beta-blocker,  ARN I, spironolactone, SGLT2 inhibitor, however currently with severe protein calorie malnutrition, physically not doing well.  In the past in review of notes, she has had some trouble with beta-blockers in the past -  fatigue.  Unfortunately our choices are limited.  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  Used to be on methimazole.  Question when this stopped. -TSH was 0.23 on admission however free T4 was 0.96 in the normal range.   PVCs - Fairly frequent.  Did not tolerate mexiletine.  We will utilize metoprolol.  Goals of care -During the phone call regarding the risks and benefits of the TEE cardioversion, her son-in-law brought up DNR -I explained that currently she is a full code, which means we do everything - I also explained that I think it would be a good idea to discuss options with her primary attending physician -Consider palliative care consult, to assist with family discussion with her and her son and daughter.  Apparently according to her son she has gone to skilled nursing facilities in the past and has asked church members to come pick her up.  She lives alone.  She does not eat well.  Medication compliance challenges.  Likely has some level of underlying dementia.   For questions or updates, please contact [facility] Please consult [email or web address] for contact info under        Signed, Theodore Demark, PA-C  10/19/2021, 8:05 AM

## 2021-10-19 NOTE — Progress Notes (Signed)
  Echocardiogram Echocardiogram Transesophageal has been performed.  [name] [name] 10/19/2021, 2:57 PM

## 2021-10-19 NOTE — Care Management Important Message (Signed)
Important Message  Patient Details IM Letter placed in Patients room. Name: [name] [name] MRN: [id number] Date of Birth: 1940/07/27   Medicare Important Message Given:  Yes     [name] 10/19/2021, 11:56 AM

## 2021-10-19 NOTE — Progress Notes (Signed)
    Transesophageal Echocardiogram Note  RIYANA FLAMING [id number] 1941-03-02  Procedure: Transesophageal Echocardiogram Indications: Atrial fibrillation  Procedure Details Consent: Obtained Time Out: Verified patient identification, verified procedure, site/side was marked, verified correct patient position, special equipment/implants available, Radiology Safety Procedures followed,  medications/allergies/relevent history reviewed, required imaging and test results available.  Performed  Medications:  Pt sedated by anesthesia with diprovan 202 mg IV.  Mild LV dysfunction; mild LAE; no LAA thrombus (definity used); mild AI; s/p MV repair with mild MR.  Pt subsequently had synchronized DCCV with 120J to sinus bradycardia with ventricular bigeminy. No immediate complications; continue xarelto.   Complications: No apparent complications Patient did tolerate procedure well.  Olga Millers, MD

## 2021-10-19 NOTE — Anesthesia Preprocedure Evaluation (Addendum)
Anesthesia Evaluation  Patient identified by MRN, date of birth, ID band Patient awake    Reviewed: Allergy & Precautions, NPO status , Patient's Chart, lab work & pertinent test results  History of Anesthesia Complications (+) PONV and history of anesthetic complications  Airway Mallampati: III  TM Distance: >3 FB Neck ROM: Full    Dental  (+) Dental Advisory Given, Partial Upper   Pulmonary neg pulmonary ROS,    Pulmonary exam normal        Cardiovascular hypertension, Pt. on medications + CAD and +CHF  + dysrhythmias Atrial Fibrillation + Valvular Problems/Murmurs (s/p MV repair)  Rhythm:Irregular Rate:Tachycardia   '23 TTE - EF 40 to 45%. There is severe hypokinesis of  the left ventricular, entire anteroseptal wall. Left atrial size was mildly dilated. Mild mitral valve regurgitation. 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    Anesthesia Other Findings   Reproductive/Obstetrics                            Anesthesia Physical Anesthesia Plan  ASA: 3  Anesthesia Plan: MAC and General   Post-op Pain Management: Minimal or no pain anticipated   Induction:   PONV Risk Score and Plan: 3 and Propofol infusion and Treatment may vary due to age or medical condition  Airway Management Planned: Nasal Cannula and Natural Airway  Additional Equipment: None  Intra-op Plan:   Post-operative Plan:   Informed Consent: I have reviewed the patients History and Physical, chart, labs and discussed the procedure including the risks, benefits and alternatives for the proposed anesthesia with the  patient or authorized representative who has indicated his/her understanding and acceptance.       Plan Discussed with: CRNA and Anesthesiologist  Anesthesia Plan Comments: (May begin procedure as MAC with conversion to GA as indicated by procedure )       Anesthesia Quick Evaluation

## 2021-10-20 ENCOUNTER — Encounter (HOSPITAL_COMMUNITY): Payer: Self-pay | Admitting: Cardiology

## 2021-10-20 DIAGNOSIS — I10 Essential (primary) hypertension: Secondary | ICD-10-CM | POA: Diagnosis not present

## 2021-10-20 DIAGNOSIS — I4891 Unspecified atrial fibrillation: Secondary | ICD-10-CM | POA: Diagnosis not present

## 2021-10-20 LAB — CULTURE, BLOOD (ROUTINE X 2)
Culture: NO GROWTH
Culture: NO GROWTH
Special Requests: ADEQUATE
Special Requests: ADEQUATE

## 2021-10-20 MED ORDER — POLYETHYLENE GLYCOL 3350 17 G PO PACK
17.0000 g | PACK | Freq: Every day | ORAL | Status: DC
  Administered 2021-10-20 – 2021-10-25 (×6): 17 g via ORAL
  Filled 2021-10-20 (×6): qty 1

## 2021-10-20 MED ORDER — METOPROLOL TARTRATE 25 MG PO TABS
25.0000 mg | ORAL_TABLET | Freq: Two times a day (BID) | ORAL | Status: AC
  Administered 2021-10-20 – 2021-10-21 (×3): 25 mg via ORAL
  Filled 2021-10-20 (×3): qty 1

## 2021-10-20 NOTE — Progress Notes (Signed)
Pt has not had a BM in 3 days, c/o constipation. MD made aware. Miralax ordered and given. Pt also having difficulty urinating this shift. Unable to urinate even on BSC. Bladder scan showing 424 ml. MD Pahwani paged for further orders.

## 2021-10-20 NOTE — Progress Notes (Addendum)
Patient found standing at foot of bed attempting to go to the bathroom unassisted. Noted patient is disoriented to place and situation. Patient oriented to self. Reoriented patient to place and situation. Patient assisted to bed side commode and then back to bed. Will continue to monitor.

## 2021-10-20 NOTE — Progress Notes (Signed)
Progress Note  Patient Name: [name] [name] Date of Encounter: 10/20/2021  [facility] Cardiologist: Dietrich Pates, MD   Subjective   Feeling tired.  Short of breath with ambulation. +constipation  Inpatient Medications    Scheduled Meds:  ALPRAZolam  0.25 mg Oral QHS   feeding supplement  237 mL Oral BID BM   losartan  25 mg Oral Daily   metoprolol tartrate  50 mg Oral Q6H   multivitamin with minerals  1 tablet Oral Daily   Rivaroxaban  15 mg Oral Q supper   sotalol  80 mg Oral Q12H   Continuous Infusions:  PRN Meds: acetaminophen **OR** acetaminophen, HYDROcodone-acetaminophen, hydrocortisone cream, lip balm, LORazepam, metoprolol tartrate, ondansetron **OR** ondansetron (ZOFRAN) IV, mouth rinse   Vital Signs    Vitals:   10/20/21 0400 10/20/21 0540 10/20/21 0600 10/20/21 0900  BP: 117/73     Pulse:  (!) 51    Resp:      Temp:   97.7 F (36.5 C) 98.7 F (37.1 C)  TempSrc:   Oral Oral  SpO2:   99%   Weight:      Height:        Intake/Output Summary (Last 24 hours) at 10/20/2021 1149 Last data filed at 10/20/2021 0849 Gross per 24 hour  Intake 790 ml  Output 300 ml  Net 490 ml      10/19/2021    1:00 PM 10/15/2021    8:45 AM 09/17/2019    3:17 PM  Last 3 Weights  Weight (lbs) 94 lb 12.8 oz 94 lb 12.8 oz 92 lb 9.6 oz  Weight (kg) 43 kg 43 kg 42.003 kg      Telemetry    Sinus rhythm/sinus bradycardia.  Frequent PVCs.  QTc 447 ms- Personally Reviewed  ECG    Sinus rhythm.  Rate 72 bpm.  PVCs. LAD - Personally Reviewed  Physical Exam   VS:  BP 117/73   Pulse (!) 51   Temp 98.7 F (37.1 C) (Oral)   Resp 15   Ht 5\' 6"  (1.676 m)   Wt 43 kg   SpO2 99%   BMI 15.30 kg/m  , BMI Body mass index is 15.3 kg/m. GENERAL:  Frail.  No acute distress HEENT: Pupils equal round and reactive, fundi not visualized, oral mucosa unremarkable NECK:  No jugular venous distention, waveform within normal limits, carotid upstroke brisk and symmetric, no bruits,  no thyromegaly LUNGS:  Clear to auscultation bilaterally HEART:  mostly regular.  Frequent ectopy.  PMI not displaced or sustained,S1 and S2 within normal limits, no S3, no S4, no clicks, no rubs, no murmurs ABD:  Flat, positive bowel sounds normal in frequency in pitch, no bruits, no rebound, no guarding, no midline pulsatile mass, no hepatomegaly, no splenomegaly EXT:  2 plus pulses throughout, no edema, no cyanosis no clubbing SKIN:  No rashes no nodules NEURO:  Cranial nerves II through XII grossly intact, motor grossly intact throughout PSYCH:  Cognitively intact, oriented to person place and time   Labs    High Sensitivity Troponin:   Recent Labs  Lab 10/15/21 0929 10/15/21 1135  TROPONINIHS 13 10     Chemistry Recent Labs  Lab 10/15/21 0928 10/15/21 0929 10/16/21 0526 10/18/21 0733  NA 134*  --  137 139  K 4.1  --  4.1 4.0  CL 101  --  103 105  CO2 25  --  26 28  GLUCOSE 110*  --  111* 118*  BUN  15  --  16 25*  CREATININE 0.69  --  0.77 0.99  CALCIUM 10.3  --  10.3 10.3  MG  --  2.0  --   --   PROT 8.1  --  7.0  --   ALBUMIN 4.1  --  3.7  --   AST 17  --  16  --   ALT 10  --  9  --   ALKPHOS 53  --  47  --   BILITOT 1.3*  --  1.2  --   GFRNONAA >60  --  >60 58*  ANIONGAP 8  --  8 6    Lipids No results for input(s): "CHOL", "TRIG", "HDL", "LABVLDL", "LDLCALC", "CHOLHDL" in the last 168 hours.  Hematology Recent Labs  Lab 10/15/21 0928 10/16/21 0526 10/18/21 0733  WBC 9.0 12.1* 10.4  RBC 4.77 4.39 4.41  HGB 14.3 13.2 13.2  HCT 43.3 39.6 40.3  MCV 90.8 90.2 91.4  MCH 30.0 30.1 29.9  MCHC 33.0 33.3 32.8  RDW 13.4 13.4 14.0  PLT 246 258 258   Thyroid  Recent Labs  Lab 10/15/21 0929 10/15/21 1037  TSH 0.238*  --   FREET4  --  0.96    BNPNo results for input(s): "BNP", "PROBNP" in the last 168 hours.  DDimer No results for input(s): "DDIMER" in the last 168 hours.   Radiology    ECHO TEE  Result Date: 10/19/2021    TRANSESOPHOGEAL ECHO  REPORT   Patient Name:   [name] [name] Date of Exam: 10/19/2021 Medical Rec #:  [id number]         Height:       66.0 in Accession #:    [id number]        Weight:       94.8 lb Date of Birth:  29-Jun-1940         BSA:          1.456 m Patient Age:    81 years          BP:           136/89 mmHg Patient Gender: F                 HR:           119 bpm. Exam Location:  Outpatient Procedure: Transesophageal Echo, Intracardiac Opacification Agent, Color Doppler            and Cardiac Doppler Indications:     atrial fibrillation  History:         Patient has prior history of Echocardiogram examinations, most                  recent 10/16/2021. Arrythmias:bigeminy; Risk                  Factors:Hypertension and Dyslipidemia.                   Mitral Valve: valve is present in the mitral position.  Sonographer:     Delcie Roch RDCS Referring Phys:  [id number] BRIAN S CRENSHAW Diagnosing Phys: Olga Millers MD PROCEDURE: After discussion of the risks and benefits of a TEE, an informed consent was obtained from the patient. The transesophogeal probe was passed without difficulty through the esophogus of the patient. Imaged were obtained with the patient in a left lateral decubitus position. Sedation performed by different physician. The patient was monitored while under deep sedation. Anesthestetic sedation was provided intravenously  by Anesthesiology: 200mg  of Propofol, 40mg  of Lidocaine. The patient developed no complications during the procedure. A direct current cardioversion was performed. IMPRESSIONS  1. Definity used to assess left atrial appendage; no thrombus noted.  2. Left ventricular ejection fraction, by estimation, is 40 to 45%. The left ventricle has mildly decreased function. The left ventricle demonstrates global hypokinesis.  3. Right ventricular systolic function is normal. The right ventricular size is normal.  4. Left atrial size was mildly dilated. No left atrial/left atrial appendage thrombus was  detected.  5. The mitral valve has been repaired/replaced. Mild mitral valve regurgitation. No evidence of mitral stenosis. There is a present in the mitral position.  6. The aortic valve is tricuspid. Aortic valve regurgitation is mild. Aortic valve sclerosis is present, with no evidence of aortic valve stenosis. FINDINGS  Left Ventricle: Left ventricular ejection fraction, by estimation, is 40 to 45%. The left ventricle has mildly decreased function. The left ventricle demonstrates global hypokinesis. Definity contrast agent was given IV to delineate the left ventricular  endocardial borders. The left ventricular internal cavity size was normal in size. Right Ventricle: The right ventricular size is normal. Right vetricular wall thickness was not assessed. Right ventricular systolic function is normal. Left Atrium: Left atrial size was mildly dilated. No left atrial/left atrial appendage thrombus was detected. Right Atrium: Right atrial size was normal in size. Pericardium: There is no evidence of pericardial effusion. Mitral Valve: The mitral valve has been repaired/replaced. Mild mitral valve regurgitation. There is a present in the mitral position. No evidence of mitral valve stenosis. MV peak gradient, 8.3 mmHg. The mean mitral valve gradient is 4.3 mmHg. Tricuspid Valve: The tricuspid valve is normal in structure. Tricuspid valve regurgitation is mild. Aortic Valve: The aortic valve is tricuspid. Aortic valve regurgitation is mild. Aortic valve sclerosis is present, with no evidence of aortic valve stenosis. Pulmonic Valve: The pulmonic valve was normal in structure. Pulmonic valve regurgitation is trivial. Aorta: The aortic root is normal in size and structure. There is minimal (Grade I) plaque involving the descending aorta. IAS/Shunts: No atrial level shunt detected by color flow Doppler. Additional Comments: Definity used to assess left atrial appendage; no thrombus noted.  MITRAL VALVE MV Peak grad: 8.3  mmHg MV Mean grad: 4.3 mmHg MV Vmax:      1.44 m/s MV Vmean:     94.6 cm/s Olga Millers MD Electronically signed by Olga Millers MD Signature Date/Time: 10/19/2021/3:38:42 PM    Final     Cardiac Studies   TEE 10/19/21:  1. Definity used to assess left atrial appendage; no thrombus noted.   2. Left ventricular ejection fraction, by estimation, is 40 to 45%. The  left ventricle has mildly decreased function. The left ventricle  demonstrates global hypokinesis.   3. Right ventricular systolic function is normal. The right ventricular  size is normal.   4. Left atrial size was mildly dilated. No left atrial/left atrial  appendage thrombus was detected.   5. The mitral valve has been repaired/replaced. Mild mitral valve  regurgitation. No evidence of mitral stenosis. There is a present in the  mitral position.   6. The aortic valve is tricuspid. Aortic valve regurgitation is mild.  Aortic valve sclerosis is present, with no evidence of aortic valve  stenosis.   TTE 10/16/21:  1. Left ventricular ejection fraction, by estimation, is 40 to 45%. The  left ventricle has mildly decreased function. The left ventricle  demonstrates regional wall motion abnormalities (see scoring  diagram/findings for description). Left ventricular  diastolic function could not be evaluated. There is severe hypokinesis of  the left ventricular, entire anteroseptal wall.   2. Right ventricular systolic function is normal. The right ventricular  size is small.   3. Left atrial size was mildly dilated.   4. The mitral valve is grossly normal. Mild mitral valve regurgitation.   5. Calcification of the non coronary cusp. . The aortic valve is  calcified. Aortic valve regurgitation is mild.   6. The inferior vena cava is normal in size with greater than 50%  respiratory variability, suggesting right atrial pressure of 3 mmHg.   Patient Profile     Ms. 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  Assessment & Plan    # Persistent atrial fibrillation:  Now in sinus rhythm with frequent PVCs after DCCV yesterday.  Of note, her appendage was not ligated despite the fact that it was sutured at the time of her MAZE and valve repair in 2014.  QTc was .  OK to continue sotalol.  Will reduce her metoprolol to 50mg  bid given bradycardia.  Consolidate to succinate tomorrow if rate is stable.  Continue Xarelto.  # Hypertension:  Continue losartan and reducing metoprolol as above.        For questions or updates, please contact [facility] Please consult [email or web address] for contact info under        Signed, Chilton Si, MD  10/20/2021, 11:49 AM

## 2021-10-21 DIAGNOSIS — I10 Essential (primary) hypertension: Secondary | ICD-10-CM | POA: Diagnosis not present

## 2021-10-21 DIAGNOSIS — I5042 Chronic combined systolic (congestive) and diastolic (congestive) heart failure: Secondary | ICD-10-CM | POA: Insufficient documentation

## 2021-10-21 DIAGNOSIS — I5022 Chronic systolic (congestive) heart failure: Secondary | ICD-10-CM | POA: Diagnosis not present

## 2021-10-21 DIAGNOSIS — R9431 Abnormal electrocardiogram [ECG] [EKG]: Secondary | ICD-10-CM | POA: Diagnosis not present

## 2021-10-21 DIAGNOSIS — I4891 Unspecified atrial fibrillation: Secondary | ICD-10-CM | POA: Diagnosis not present

## 2021-10-21 LAB — BASIC METABOLIC PANEL
Anion gap: 8 (ref 5–15)
BUN: 12 mg/dL (ref 8–23)
CO2: 27 mmol/L (ref 22–32)
Calcium: 10 mg/dL (ref 8.9–10.3)
Chloride: 106 mmol/L (ref 98–111)
Creatinine, Ser: 0.69 mg/dL (ref 0.44–1.00)
GFR, Estimated: >60 mL/min (ref >60)
Glucose, Bld: 103 mg/dL — ABNORMAL HIGH (ref 70–99)
Potassium: 3.7 mmol/L (ref 3.5–5.1)
Sodium: 141 mmol/L (ref 135–145)

## 2021-10-21 MED ORDER — QUETIAPINE FUMARATE 25 MG PO TABS
25.0000 mg | ORAL_TABLET | Freq: Every day | ORAL | Status: DC
Start: 2021-10-21 — End: 2021-10-21

## 2021-10-21 MED ORDER — LOSARTAN POTASSIUM 25 MG PO TABS
12.5000 mg | ORAL_TABLET | Freq: Every day | ORAL | Status: DC
  Administered 2021-10-22 – 2021-10-24 (×3): 12.5 mg via ORAL
  Filled 2021-10-21 (×3): qty 1

## 2021-10-21 MED ORDER — HALOPERIDOL LACTATE 5 MG/ML IJ SOLN: 5.0000 mg | Freq: Four times a day (QID) | INTRAMUSCULAR | Status: DC | PRN

## 2021-10-21 MED ORDER — OLANZAPINE 5 MG PO TABS
5.0000 mg | ORAL_TABLET | ORAL | Status: DC
  Administered 2021-10-21 – 2021-10-26 (×6): 5 mg via ORAL
  Filled 2021-10-21 (×6): qty 1

## 2021-10-21 MED ORDER — METOPROLOL SUCCINATE ER 50 MG PO TB24
75.0000 mg | ORAL_TABLET | Freq: Every day | ORAL | Status: DC
  Filled 2021-10-21: qty 1

## 2021-10-21 NOTE — Plan of Care (Signed)
  Problem: Clinical Measurements: Goal: Diagnostic test results will improve Outcome: Progressing   Problem: Activity: Goal: Risk for activity intolerance will decrease Outcome: Progressing   Problem: Pain Managment: Goal: General experience of comfort will improve Outcome: Progressing   Problem: Safety: Goal: Ability to remain free from injury will improve Outcome: Progressing   Problem: Nutrition: Goal: Adequate nutrition will be maintained Outcome: Not Progressing

## 2021-10-21 NOTE — Progress Notes (Signed)
PROGRESS NOTE    [name] [name]  YIR:[id number] DOB: 09/18/1940 DOA: 10/15/2021 PCP: Geoffry Paradise, MD   Brief Narrative:  HPI: [name] [name] is a 81 y.o. female with medical history significant of afib, hyperthyroidism, HTN. Her history is from her daughter as she is a little confused at this point. Her daughter reports that she had a UTI and hematuria last week. She was started on augmentin. Her urine clears, but she remained weak. When her daughter would check in on her, she complained of abdominal pain and poor appetite over the last few days. During this time, she has become more confused. When her symptoms did not improve today, her daughter brought her into the ED for evaluation.    Of note per the EDP, the patient has someone who checks in on her and apparently gave her advice to hold her xarelto because of the blood in her urine. In addition, she apparently has not been taking her sotalol as well. She has no reason as to why.  Assessment & Plan:   Principal Problem:   Atrial fibrillation with RVR (HCC) Active Problems:   Hypertension   Anxiety   Hyperthyroidism   Acute metabolic encephalopathy   Pressure injury of skin  A-fib with RVR: Rate still uncontrolled despite of being on 50 mg p.o. every 6 hours metoprolol and sotalol which was resumed by cardiology 10/17/2021.  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  Underwent successful TEE/DCCV on 10/19/2021, currently in normal sinus rhythm with PVCs.  Remains on sotalol, metoprolol further reduced by cardiology to 25 mg p.o. twice daily.  Continue Xarelto.  She is nearing discharge, PT OT recommended SNF, TOC consulted, patient agreeable with going to the SNF and so is her daughter who I spoke to over the phone.  Acute metabolic encephalopathy/delirium: Reportedly patient was confused upon admission.  She does have intermittent confusion.  I spoke to her her daughter and she tells  me that patient does have dementia although she is not formally tested but she tends to forget things easily.  Patient has remained alert and oriented for last several days.  Last night she was confused.  Per reports, it sounds like she was having delirium.  This morning during my conversation with her, she was alert and oriented but she said that she was not feeling well.  I will start her on Seroquel tonight.  Stop benzodiazepines, start on Haldol for agitation.  Subclinical hyperthyroidism: TSH is low but T3-T4 normal.  Essential hypertension: Blood pressure controlled, continue losartan and metoprolol.  DVT prophylaxis: Place and maintain sequential compression device Start: 10/17/21 1132 Xarelto   Code Status: Full Code  Family Communication:  None present at bedside.  Discussed with patient's daughter over the phone.  Status is: Inpatient Remains inpatient appropriate because: Medically stable, waiting for bed placement at SNF.   Estimated body mass index is 15.3 kg/m as calculated from the following:   Height as of this encounter: 5\' 6"  (1.676 m).   Weight as of this encounter: 43 kg.  Pressure Injury 10/15/21 Coccyx Left Stage 1 -  Intact skin with non-blanchable redness of a localized area usually over a bony prominence. (Active)  10/15/21 1619  Location: Coccyx  Location Orientation: Left  Staging: Stage 1 -  Intact skin with non-blanchable redness of a localized area usually over a bony prominence.  Wound Description (Comments):   Present on Admission: Yes  Dressing Type Foam - Lift  dressing to assess site every shift 10/20/21 2138   Nutritional Assessment: Body mass index is 15.3 kg/m.[name] Seen by dietician.  I agree with the assessment and plan as outlined below: Nutrition Status: Nutrition Problem: Moderate Malnutrition Etiology: chronic illness Signs/Symptoms: moderate fat depletion, severe muscle depletion Interventions: Ensure Enlive (each supplement provides 350kcal  and 20 grams of protein), MVI  . Skin Assessment: I have examined the patient's skin and I agree with the wound assessment as performed by the wound care RN as outlined below: Pressure Injury 10/15/21 Coccyx Left Stage 1 -  Intact skin with non-blanchable redness of a localized area usually over a bony prominence. (Active)  10/15/21 1619  Location: Coccyx  Location Orientation: Left  Staging: Stage 1 -  Intact skin with non-blanchable redness of a localized area usually over a bony prominence.  Wound Description (Comments):   Present on Admission: Yes  Dressing Type Foam - Lift dressing to assess site every shift 10/20/21 2138    Consultants:  Cardiology  Procedures:  None  Antimicrobials:  Anti-infectives (From admission, onward)    Start     Dose/Rate Route Frequency Ordered Stop   10/15/21 0900  cefTRIAXone (ROCEPHIN) 2 g in sodium chloride 0.9 % 100 mL IVPB        2 g 200 mL/hr over 30 Minutes Intravenous  Once 10/15/21 0857 10/15/21 1037         Subjective:  Patient seen and examined.  She has no specific complaints.  She said that she was just not feeling well.  She was alert and oriented.  Objective: Vitals:   10/20/21 2000 10/21/21 0000 10/21/21 0400 10/21/21 0900  BP: 106/67     Pulse:      Resp: 17     Temp: 98.3 F (36.8 C) 98.6 F (37 C) 98.2 F (36.8 C) 98 F (36.7 C)  TempSrc: Oral Oral    SpO2:      Weight:      Height:        Intake/Output Summary (Last 24 hours) at 10/21/2021 1228 Last data filed at 10/21/2021 0900 Gross per 24 hour  Intake 400 ml  Output 900 ml  Net -500 ml    Filed Weights   10/15/21 0845 10/19/21 1300  Weight: 43 kg 43 kg    Examination:  General exam: Appears calm and comfortable  Respiratory system: Clear to auscultation. Respiratory effort normal. Cardiovascular system: S1 & S2 heard, RRR. No JVD, murmurs, rubs, gallops or clicks. No pedal edema. Gastrointestinal system: Abdomen is nondistended, soft and  nontender. No organomegaly or masses felt. Normal bowel sounds heard. Central nervous system: Alert and oriented. No focal neurological deficits. Extremities: Symmetric 5 x 5 power. Skin: No rashes, lesions or ulcers.  Psychiatry: Judgement and insight appear poor  Data Reviewed: I have personally reviewed following labs and imaging studies  CBC: Recent Labs  Lab 10/15/21 0928 10/16/21 0526 10/18/21 0733  WBC 9.0 12.1* 10.4  NEUTROABS 6.8  --  6.5  HGB 14.3 13.2 13.2  HCT 43.3 39.6 40.3  MCV 90.8 90.2 91.4  PLT 246 258 258    Basic Metabolic Panel: Recent Labs  Lab 10/15/21 0928 10/15/21 0929 10/16/21 0526 10/18/21 0733 10/21/21 0752  NA 134*  --  137 139 141  K 4.1  --  4.1 4.0 3.7  CL 101  --  103 105 106  CO2 25  --  26 28 27   GLUCOSE 110*  --  111* 118* 103*  BUN 15  --  16 25* 12  CREATININE 0.69  --  0.77 0.99 0.69  CALCIUM 10.3  --  10.3 10.3 10.0  MG  --  2.0  --   --   --     GFR: Estimated Creatinine Clearance: 38.1 mL/min (by C-G formula based on SCr of 0.69 mg/dL). Liver Function Tests: Recent Labs  Lab 10/15/21 0928 10/16/21 0526  AST 17 16  ALT 10 9  ALKPHOS 53 47  BILITOT 1.3* 1.2  PROT 8.1 7.0  ALBUMIN 4.1 3.7    No results for input(s): "LIPASE", "AMYLASE" in the last 168 hours. No results for input(s): "AMMONIA" in the last 168 hours. Coagulation Profile: Recent Labs  Lab 10/15/21 0928  INR 1.1    Cardiac Enzymes: No results for input(s): "CKTOTAL", "CKMB", "CKMBINDEX", "TROPONINI" in the last 168 hours. BNP (last 3 results) No results for input(s): "PROBNP" in the last 8760 hours. HbA1C: No results for input(s): "HGBA1C" in the last 72 hours. CBG: No results for input(s): "GLUCAP" in the last 168 hours. Lipid Profile: No results for input(s): "CHOL", "HDL", "LDLCALC", "TRIG", "CHOLHDL", "LDLDIRECT" in the last 72 hours. Thyroid Function Tests: No results for input(s): "TSH", "T4TOTAL", "FREET4", "T3FREE", "THYROIDAB" in  the last 72 hours.  Anemia Panel: No results for input(s): "VITAMINB12", "FOLATE", "FERRITIN", "TIBC", "IRON", "RETICCTPCT" in the last 72 hours. Sepsis Labs: Recent Labs  Lab 10/15/21 [id number] 10/15/21 1135 10/15/21 1421  PROCALCITON  --   --  <0.10  LATICACIDVEN 1.6 1.3  --      Recent Results (from the past 240 hour(s))  Urine Culture     Status: None   Collection Time: 10/15/21  8:57 AM   Specimen: In/Out Cath Urine  Result Value Ref Range Status   Specimen Description   Final    IN/OUT CATH URINE Performed at [facility], [place] [address], [place], [place] [address]    Special Requests   Final    NONE Performed at [facility], [place] [address], [place], [place] [address]    Culture   Final    NO GROWTH Performed at [facility], [place] [address], [place], [place] [address]    Report Status 10/16/2021 FINAL  Final  Blood Culture (routine x 2)     Status: None   Collection Time: 10/15/21  9:10 AM   Specimen: Right Antecubital; Blood  Result Value Ref Range Status   Specimen Description   Final    RIGHT ANTECUBITAL BLOOD Performed at [facility] [address], [place], [place] [address]    Special Requests   Final    Blood Culture adequate volume BOTTLES DRAWN AEROBIC AND ANAEROBIC Performed at [facility] [address] [place], [place] [address]    Culture   Final    NO GROWTH 5 DAYS Performed at [facility], [place] [address], [place], [place] [address]    Report Status 10/20/2021 FINAL  Final  Blood Culture (routine x 2)     Status: None   Collection Time: 10/15/21  9:15 AM   Specimen: BLOOD LEFT WRIST  Result Value Ref Range Status   Specimen Description   Final    BLOOD LEFT WRIST BLOOD Performed at [facility] [address] [place], [place] [address]    Special Requests   Final    Blood Culture adequate volume BOTTLES DRAWN AEROBIC  ONLY Performed at [facility], [place] [address] [place], [place] [address]    Culture   Final    NO GROWTH 5 DAYS Performed at [facility], [place] [address], [place], [place] [address]    Report Status 10/20/2021 FINAL  Final     Radiology Studies: ECHO TEE  Result Date: 10/19/2021    TRANSESOPHOGEAL ECHO REPORT   Patient Name:   [name] [name] Date of Exam: 10/19/2021 Medical Rec #:  [id number]         Height:       66.0 in Accession #:    [id number]        Weight:       94.8 lb Date of Birth:  21-Aug-1940         BSA:          1.456 m Patient Age:    55 years          BP:           136/89 mmHg Patient Gender: F                 HR:           119 bpm. Exam Location:  Outpatient Procedure: Transesophageal Echo, Intracardiac Opacification Agent, Color Doppler            and Cardiac Doppler Indications:     atrial fibrillation  History:         Patient has prior history of Echocardiogram examinations, most                  recent 10/16/2021. Arrythmias:bigeminy; Risk                  Factors:Hypertension and Dyslipidemia.                   Mitral Valve: valve is present in the mitral position.  Sonographer:     Johny Chess RDCS Referring Phys:  Juana Di­az Diagnosing Phys: Kirk Ruths MD PROCEDURE: After discussion of the risks and benefits of a TEE, an informed consent was obtained from the patient. The transesophogeal probe was passed without difficulty through the esophogus of the patient. Imaged were obtained with the patient in a left lateral decubitus position. Sedation performed by different physician. The patient was monitored while under deep sedation. Anesthestetic sedation was provided intravenously by Anesthesiology: 200mg  of Propofol, 40mg  of Lidocaine. The patient developed no complications during the procedure. A direct current cardioversion was performed. IMPRESSIONS  1. Definity used to assess left atrial appendage; no thrombus noted.  2. Left  ventricular ejection fraction, by estimation, is 40 to 45%. The left ventricle has mildly decreased function. The left ventricle demonstrates global hypokinesis.  3. Right ventricular systolic function is normal. The right ventricular size is normal.  4. Left atrial size was mildly dilated. No left atrial/left atrial appendage thrombus was detected.  5. The mitral valve has been repaired/replaced. Mild mitral valve regurgitation. No evidence of mitral stenosis. There is a present in the mitral position.  6. The aortic valve is tricuspid. Aortic valve regurgitation is mild. Aortic valve sclerosis is present, with no evidence of aortic valve stenosis. FINDINGS  Left Ventricle: Left ventricular ejection fraction, by estimation, is 40 to 45%. The left ventricle has mildly decreased function. The left ventricle demonstrates global hypokinesis. Definity contrast agent was given IV to delineate the left ventricular  endocardial borders. The left ventricular internal cavity size was normal in size. Right Ventricle:  The right ventricular size is normal. Right vetricular wall thickness was not assessed. Right ventricular systolic function is normal. Left Atrium: Left atrial size was mildly dilated. No left atrial/left atrial appendage thrombus was detected. Right Atrium: Right atrial size was normal in size. Pericardium: There is no evidence of pericardial effusion. Mitral Valve: The mitral valve has been repaired/replaced. Mild mitral valve regurgitation. There is a present in the mitral position. No evidence of mitral valve stenosis. MV peak gradient, 8.3 mmHg. The mean mitral valve gradient is 4.3 mmHg. Tricuspid Valve: The tricuspid valve is normal in structure. Tricuspid valve regurgitation is mild. Aortic Valve: The aortic valve is tricuspid. Aortic valve regurgitation is mild. Aortic valve sclerosis is present, with no evidence of aortic valve stenosis. Pulmonic Valve: The pulmonic valve was normal in structure.  Pulmonic valve regurgitation is trivial. Aorta: The aortic root is normal in size and structure. There is minimal (Grade I) plaque involving the descending aorta. IAS/Shunts: No atrial level shunt detected by color flow Doppler. Additional Comments: Definity used to assess left atrial appendage; no thrombus noted.  MITRAL VALVE MV Peak grad: 8.3 mmHg MV Mean grad: 4.3 mmHg MV Vmax:      1.44 m/s MV Vmean:     94.6 cm/s Kirk Ruths MD Electronically signed by Kirk Ruths MD Signature Date/Time: 10/19/2021/3:38:42 PM    Final     Scheduled Meds:  feeding supplement  237 mL Oral BID BM   losartan  25 mg Oral Daily   metoprolol tartrate  25 mg Oral BID   multivitamin with minerals  1 tablet Oral Daily   polyethylene glycol  17 g Oral Daily   QUEtiapine  25 mg Oral QHS   Rivaroxaban  15 mg Oral Q supper   sotalol  80 mg Oral Q12H   Continuous Infusions:   LOS: 6 days   Darliss Cheney, MD [facility]  10/21/2021, 12:28 PM   *Please note that this is a verbal dictation therefore any spelling or grammatical errors are due to the "[place] [employer]" system interpretation.  Please page via [place] and do not message via secure chat for urgent patient care matters. Secure chat can be used for non urgent patient care matters.  How to contact the [facility] Attending or Consulting provider [place] or covering provider during after hours [place], for this patient?  Check the care team in [facility] and look for a) attending/consulting TRH provider listed and b) the [facility] team listed. Page or secure chat [phone]. Log into [email or web address] and use [place]'s universal password to access. If you do not have the password, please contact the hospital operator. Locate the [facility] provider you are looking for under [facility] and page to a number that you can be directly reached. If you still have difficulty reaching the provider, please page the [facility] (Director on Call) for the Hospitalists listed on amion for  assistance.

## 2021-10-21 NOTE — TOC Initial Note (Signed)
Transition of Care [facility]) - Initial/Assessment Note    Patient Details  Name: [name] [name] MRN: [id number] Date of Birth: Nov 04, 1940  [facility]) CM/SW Contact:    Lanier Clam, RN Phone Number: 10/21/2021, 12:49 PM  Clinical Narrative: Noted still in posey restraints-until agitation & out of restraints 24hrs free-not medically stable for pursuing ST SNF prior auth.Unable to leave message w/dtr Marylene Land.                    Barriers to Discharge: Continued Medical Work up   Patient Goals and CMS Choice Patient states their goals for this hospitalization and ongoing recovery are:: Rehab CMS Medicare.gov Compare Post Acute Care list provided to:: Patient Represenative (must comment) Marylene Land dtr) Choice offered to / list presented to : Adult Children  Expected Discharge Plan and Services     Discharge Planning Services: CM Consult Post Acute Care Choice: Skilled Nursing Facility Living arrangements for the past 2 months: Single Family Home                                      Prior Living Arrangements/Services Living arrangements for the past 2 months: Single Family Home Lives with:: Adult Children Patient language and need for interpreter reviewed:: Yes Do you feel safe going back to the place where you live?: Yes      Need for Family Participation in Patient Care: Yes (Comment)     Criminal Activity/Legal Involvement Pertinent to Current Situation/Hospitalization: No - Comment as needed  Activities of Daily Living Home Assistive Devices/Equipment: Environmental consultant (specify type), Shower chair with back, Hand-held shower hose, Eyeglasses, Dentures (specify type) (upper and lower partials) ADL Screening (condition at time of admission) Patient's cognitive ability adequate to safely complete daily activities?: No Is the patient deaf or have difficulty hearing?: No Does the patient have difficulty seeing, even when wearing glasses/contacts?: No Does the  patient have difficulty concentrating, remembering, or making decisions?: Yes Patient able to express need for assistance with ADLs?: Yes Does the patient have difficulty dressing or bathing?: Yes Independently performs ADLs?: No Communication: Independent Dressing (OT): Needs assistance Is this a change from baseline?: Change from baseline, expected to last >3 days Grooming: Independent Feeding: Independent Bathing: Needs assistance Is this a change from baseline?: Change from baseline, expected to last >3 days Toileting: Needs assistance Is this a change from baseline?: Change from baseline, expected to last >3days In/Out Bed: Needs assistance Is this a change from baseline?: Change from baseline, expected to last >3 days Walks in Home: Needs assistance Is this a change from baseline?: Change from baseline, expected to last >3 days Does the patient have difficulty walking or climbing stairs?: Yes Weakness of Legs: Both Weakness of Arms/Hands: Both  Permission Sought/Granted Permission sought to share information with : Case Manager Permission granted to share information with : Yes, Verbal Permission Granted  Share Information with NAME: Case manager           Emotional Assessment Appearance:: Appears stated age Attitude/Demeanor/Rapport: Gracious Affect (typically observed): Agitated Orientation: : Oriented to Self, Oriented to Place Alcohol / Substance Use: Not Applicable Psych Involvement: No (comment)  Admission diagnosis:  Hyperthyroidism [E05.90] Atrial fibrillation with rapid ventricular response (HCC) [I48.91] Atrial fibrillation with RVR (HCC) [I48.91] Patient Active Problem List   Diagnosis Date Noted   Pressure injury of skin 10/16/2021   Atrial fibrillation with RVR (HCC) 10/15/2021  Acute metabolic encephalopathy 10/15/2021   Protein-calorie malnutrition, severe 04/24/2019   Hypokalemia 04/20/2019   Hypomagnesemia 04/20/2019   Anemia 04/20/2019    Patient is Jehovah's Witness 04/20/2019   Secondary hypercoagulable state (HCC) 04/05/2019   Malnutrition of moderate degree 05/17/2016   Bigeminy    Intractable vomiting 05/15/2016   Heart palpitations 05/15/2016   SBO (small bowel obstruction) (HCC) 05/15/2016   Acute coronary syndrome (HCC) 07/15/2015   Status post Maze operation for atrial fibrillation 01/08/2013   S/P MVR (mitral valve repair) 01/08/2013   Atrial flutter (HCC) 10/30/2012   Amaurosis fugax 10/30/2012   Dyspnea 10/28/2012   Hyperthyroidism 08/21/2012   Thyroid goiter 08/21/2012   Microscopic hematuria 08/20/2012   S/P mitral valve repair 06/28/2012   History of mitral valve prolapse with severe mitral regurgtation 05/19/2012   Refusal of blood transfusions as patient is Jehovah's Witness 05/19/2012   Atrial fibrillation (HCC) 05/16/2012   Anxiety 04/27/2011   Hypertension 04/26/2011   Diverticulitis 04/24/2011   Hyperlipidemia    PCP:  Geoffry Paradise, MD Pharmacy:   [facility] [address] [place] - [address] [place] [address] Phone: [phone] Fax: [phone]  [facility] [address] [place] [address] Phone: [phone] Fax: [phone]     Social Determinants of Health (SDOH) Interventions    Readmission Risk Interventions     No data to display

## 2021-10-21 NOTE — Anesthesia Postprocedure Evaluation (Signed)
Anesthesia Post Note  Patient: [name] [name]  Procedure(s) Performed: TRANSESOPHAGEAL ECHOCARDIOGRAM (TEE) CARDIOVERSION     Patient location during evaluation: PACU Anesthesia Type: General Level of consciousness: awake and alert Pain management: pain level controlled Vital Signs Assessment: post-procedure vital signs reviewed and stable Respiratory status: spontaneous breathing, nonlabored ventilation and respiratory function stable Cardiovascular status: stable and blood pressure returned to baseline Anesthetic complications: no   No notable events documented.  Last Vitals:  Vitals:   10/21/21 0000 10/21/21 0400  BP:    Pulse:    Resp:    Temp: 37 C 36.8 C  SpO2:      Last Pain:  Vitals:   10/21/21 0000  TempSrc: Oral  PainSc:                  Beryle Lathe

## 2021-10-21 NOTE — Progress Notes (Signed)
Progress Note  Patient Name: [name] [name] Date of Encounter: 10/21/2021  [facility] Cardiologist: Dorris Carnes, MD   Subjective   Doesn't feel as well today, ? Aware of PVCs  she has been having hallucinations today.    Inpatient Medications    Scheduled Meds:  ALPRAZolam  0.25 mg Oral QHS   feeding supplement  237 mL Oral BID BM   losartan  25 mg Oral Daily   metoprolol tartrate  25 mg Oral BID   multivitamin with minerals  1 tablet Oral Daily   polyethylene glycol  17 g Oral Daily   Rivaroxaban  15 mg Oral Q supper   sotalol  80 mg Oral Q12H   Continuous Infusions:  PRN Meds: acetaminophen **OR** acetaminophen, HYDROcodone-acetaminophen, hydrocortisone cream, lip balm, LORazepam, metoprolol tartrate, ondansetron **OR** ondansetron (ZOFRAN) IV, mouth rinse   Vital Signs    Vitals:   10/20/21 1900 10/20/21 2000 10/21/21 0000 10/21/21 0400  BP: (!) 124/110 106/67    Pulse:      Resp: 18 17    Temp:  98.3 F (36.8 C) 98.6 F (37 C) 98.2 F (36.8 C)  TempSrc:  Oral Oral   SpO2:      Weight:      Height:        Intake/Output Summary (Last 24 hours) at 10/21/2021 0751 Last data filed at 10/21/2021 0636 Gross per 24 hour  Intake 640 ml  Output 900 ml  Net -260 ml      10/19/2021    1:00 PM 10/15/2021    8:45 AM 09/17/2019    3:17 PM  Last 3 Weights  Weight (lbs) 94 lb 12.8 oz 94 lb 12.8 oz 92 lb 9.6 oz  Weight (kg) 43 kg 43 kg 42.003 kg      Telemetry    SR with freq PVCs.   - Personally Reviewed  ECG    No new - Personally Reviewed  Physical Exam   GEN: No acute distress.   Neck: No JVD Cardiac: RRR, + premature beats no murmurs, rubs, or gallops.  Respiratory: Clear to auscultation bilaterally. GI: Soft, nontender, non-distended  MS: No edema; No deformity. Neuro:  Nonfocal  Psych: Normal affect   Labs    High Sensitivity Troponin:   Recent Labs  Lab 10/15/21 0929 10/15/21 1135  TROPONINIHS 13 10     Chemistry Recent Labs   Lab 10/15/21 0928 10/15/21 0929 10/16/21 0526 10/18/21 0733  NA 134*  --  137 139  K 4.1  --  4.1 4.0  CL 101  --  103 105  CO2 25  --  26 28  GLUCOSE 110*  --  111* 118*  BUN 15  --  16 25*  CREATININE 0.69  --  0.77 0.99  CALCIUM 10.3  --  10.3 10.3  MG  --  2.0  --   --   PROT 8.1  --  7.0  --   ALBUMIN 4.1  --  3.7  --   AST 17  --  16  --   ALT 10  --  9  --   ALKPHOS 53  --  47  --   BILITOT 1.3*  --  1.2  --   GFRNONAA >60  --  >60 58*  ANIONGAP 8  --  8 6    Lipids No results for input(s): "CHOL", "TRIG", "HDL", "LABVLDL", "LDLCALC", "CHOLHDL" in the last 168 hours.  Hematology Recent Labs  Lab 10/15/21  0086 10/16/21 0526 10/18/21 0733  WBC 9.0 12.1* 10.4  RBC 4.77 4.39 4.41  HGB 14.3 13.2 13.2  HCT 43.3 39.6 40.3  MCV 90.8 90.2 91.4  MCH 30.0 30.1 29.9  MCHC 33.0 33.3 32.8  RDW 13.4 13.4 14.0  PLT 246 258 258   Thyroid  Recent Labs  Lab 10/15/21 0929 10/15/21 1037  TSH 0.238*  --   FREET4  --  0.96    BNPNo results for input(s): "BNP", "PROBNP" in the last 168 hours.  DDimer No results for input(s): "DDIMER" in the last 168 hours.   Radiology    ECHO TEE  Result Date: 10/19/2021    TRANSESOPHOGEAL ECHO REPORT   Patient Name:   [name] [name] Date of Exam: 10/19/2021 Medical Rec #:  [id number]         Height:       66.0 in Accession #:    [id number]        Weight:       94.8 lb Date of Birth:  August 13, 1940         BSA:          1.456 m Patient Age:    81 years          BP:           136/89 mmHg Patient Gender: F                 HR:           119 bpm. Exam Location:  Outpatient Procedure: Transesophageal Echo, Intracardiac Opacification Agent, Color Doppler            and Cardiac Doppler Indications:     atrial fibrillation  History:         Patient has prior history of Echocardiogram examinations, most                  recent 10/16/2021. Arrythmias:bigeminy; Risk                  Factors:Hypertension and Dyslipidemia.                   Mitral Valve:  valve is present in the mitral position.  Sonographer:     Delcie Roch RDCS Referring Phys:  [id number] BRIAN S CRENSHAW Diagnosing Phys: Olga Millers MD PROCEDURE: After discussion of the risks and benefits of a TEE, an informed consent was obtained from the patient. The transesophogeal probe was passed without difficulty through the esophogus of the patient. Imaged were obtained with the patient in a left lateral decubitus position. Sedation performed by different physician. The patient was monitored while under deep sedation. Anesthestetic sedation was provided intravenously by Anesthesiology: 200mg  of Propofol, 40mg  of Lidocaine. The patient developed no complications during the procedure. A direct current cardioversion was performed. IMPRESSIONS  1. Definity used to assess left atrial appendage; no thrombus noted.  2. Left ventricular ejection fraction, by estimation, is 40 to 45%. The left ventricle has mildly decreased function. The left ventricle demonstrates global hypokinesis.  3. Right ventricular systolic function is normal. The right ventricular size is normal.  4. Left atrial size was mildly dilated. No left atrial/left atrial appendage thrombus was detected.  5. The mitral valve has been repaired/replaced. Mild mitral valve regurgitation. No evidence of mitral stenosis. There is a present in the mitral position.  6. The aortic valve is tricuspid. Aortic valve regurgitation is mild. Aortic valve sclerosis is present, with no evidence of  aortic valve stenosis. FINDINGS  Left Ventricle: Left ventricular ejection fraction, by estimation, is 40 to 45%. The left ventricle has mildly decreased function. The left ventricle demonstrates global hypokinesis. Definity contrast agent was given IV to delineate the left ventricular  endocardial borders. The left ventricular internal cavity size was normal in size. Right Ventricle: The right ventricular size is normal. Right vetricular wall thickness was not  assessed. Right ventricular systolic function is normal. Left Atrium: Left atrial size was mildly dilated. No left atrial/left atrial appendage thrombus was detected. Right Atrium: Right atrial size was normal in size. Pericardium: There is no evidence of pericardial effusion. Mitral Valve: The mitral valve has been repaired/replaced. Mild mitral valve regurgitation. There is a present in the mitral position. No evidence of mitral valve stenosis. MV peak gradient, 8.3 mmHg. The mean mitral valve gradient is 4.3 mmHg. Tricuspid Valve: The tricuspid valve is normal in structure. Tricuspid valve regurgitation is mild. Aortic Valve: The aortic valve is tricuspid. Aortic valve regurgitation is mild. Aortic valve sclerosis is present, with no evidence of aortic valve stenosis. Pulmonic Valve: The pulmonic valve was normal in structure. Pulmonic valve regurgitation is trivial. Aorta: The aortic root is normal in size and structure. There is minimal (Grade I) plaque involving the descending aorta. IAS/Shunts: No atrial level shunt detected by color flow Doppler. Additional Comments: Definity used to assess left atrial appendage; no thrombus noted.  MITRAL VALVE MV Peak grad: 8.3 mmHg MV Mean grad: 4.3 mmHg MV Vmax:      1.44 m/s MV Vmean:     94.6 cm/s Olga Millers MD Electronically signed by Olga Millers MD Signature Date/Time: 10/19/2021/3:38:42 PM    Final     Cardiac Studies   TEE 10/19/21:  1. Definity used to assess left atrial appendage; no thrombus noted.   2. Left ventricular ejection fraction, by estimation, is 40 to 45%. The  left ventricle has mildly decreased function. The left ventricle  demonstrates global hypokinesis.   3. Right ventricular systolic function is normal. The right ventricular  size is normal.   4. Left atrial size was mildly dilated. No left atrial/left atrial  appendage thrombus was detected.   5. The mitral valve has been repaired/replaced. Mild mitral valve  regurgitation.  No evidence of mitral stenosis. There is a present in the  mitral position.   6. The aortic valve is tricuspid. Aortic valve regurgitation is mild.  Aortic valve sclerosis is present, with no evidence of aortic valve  stenosis.   Pt subsequently had synchronized DCCV with 120J to sinus bradycardia with ventricular bigeminy. No immediate complications; continue xarelto.     TTE 10/16/21:  1. Left ventricular ejection fraction, by estimation, is 40 to 45%. The  left ventricle has mildly decreased function. The left ventricle  demonstrates regional wall motion abnormalities (see scoring  diagram/findings for description). Left ventricular  diastolic function could not be evaluated. There is severe hypokinesis of  the left ventricular, entire anteroseptal wall.   2. Right ventricular systolic function is normal. The right ventricular  size is small.   3. Left atrial size was mildly dilated.   4. The mitral valve is grossly normal. Mild mitral valve regurgitation.   5. Calcification of the non coronary cusp. . The aortic valve is  calcified. Aortic valve regurgitation is mild.   6. The inferior vena cava is normal in size with greater than 50%  respiratory variability, suggesting right atrial pressure of 3 mmHg.     Patient  Profile     81 y.o. 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  Assessment & Plan    # Persistent atrial fibrillation:  Now in sinus rhythm with frequent PVCs after DCCV yesterday.  Of note, her appendage was not ligated despite the fact that it was sutured at the time of her MAZE and valve repair in 2014.  QTc was 456ms.  OK to continue sotalol.  Will reduce her metoprolol to 50mg  bid given bradycardia.  Consolidate to succinate tomorrow if rate is stable.  Continue Xarelto. HR improved  and BP  111/75 this am   # Hypertension:  Continue losartan and reducing metoprolol as above.          For questions or updates, please contact [facility] Please consult [email or web address] for contact info under        Signed, Cecilie Kicks, NP  10/21/2021, 7:51 AM

## 2021-10-22 DIAGNOSIS — I4891 Unspecified atrial fibrillation: Secondary | ICD-10-CM | POA: Diagnosis not present

## 2021-10-22 DIAGNOSIS — I10 Essential (primary) hypertension: Secondary | ICD-10-CM | POA: Diagnosis not present

## 2021-10-22 MED ORDER — CARVEDILOL 6.25 MG PO TABS
6.2500 mg | ORAL_TABLET | Freq: Two times a day (BID) | ORAL | Status: DC
  Administered 2021-10-22 – 2021-10-26 (×10): 6.25 mg via ORAL
  Filled 2021-10-22 (×10): qty 1

## 2021-10-22 MED ORDER — ALUM & MAG HYDROXIDE-SIMETH 200-200-20 MG/5ML PO SUSP
15.0000 mL | ORAL | Status: DC | PRN
Start: 2021-10-22 — End: 2021-10-27
  Administered 2021-10-22: 15 mL via ORAL
  Filled 2021-10-22: qty 30

## 2021-10-22 MED ORDER — FLEET ENEMA 7-19 GM/118ML RE ENEM
1.0000 | ENEMA | Freq: Once | RECTAL | Status: AC
  Administered 2021-10-22: 1 via RECTAL
  Filled 2021-10-22: qty 1

## 2021-10-22 NOTE — Progress Notes (Signed)
Physical Therapy Treatment Patient Details Name: [name] [name] MRN: [id number] DOB: 1941/01/13 Today's Date: 10/22/2021   History of Present Illness Patient is a 81 year old female who was admitted with A fib with RVR, acute metabolic encephalopathy,and subclinical hypothyroidism. PMH: a fib, severe MR s/p MV repair, systolic CHF, anxiety disorder, hyperlipidemia,    PT Comments    Patient making steady/slow progress with mobility. Min assist to complete bed mobility and transfers today. Pt took several small steps with min assist to move to recliner from EOB. VSS throughout. No increase in hip pain. EOS pt with NT for bath. Will continue to progress pt as able in acute setting.   Recommendations for follow up therapy are one component of a multi-disciplinary discharge planning process, led by the attending physician.  Recommendations may be updated based on patient status, additional functional criteria and insurance authorization.  Follow Up Recommendations  Skilled nursing-short term rehab (<3 hours/day) Can patient physically be transported by private vehicle: Yes   Assistance Recommended at Discharge Frequent or constant Supervision/Assistance  Patient can return home with the following A little help with walking and/or transfers;A little help with bathing/dressing/bathroom;Assistance with cooking/housework;Assist for transportation;Help with stairs or ramp for entrance   Equipment Recommendations  None recommended by PT    Recommendations for Other Services       Precautions / Restrictions Precautions Precautions: Fall Precaution Comments: monitor HR Restrictions Weight Bearing Restrictions: No     Mobility  Bed Mobility Overal bed mobility: Needs Assistance Bed Mobility: Supine to Sit     Supine to sit: HOB elevated, Min assist     General bed mobility comments: verbal/tactile cues to initiate movement to EOB. Min assist to fully pivot and scoot to EOB.     Transfers Overall transfer level: Needs assistance Equipment used: Rolling walker (2 wheels) Transfers: Sit to/from Stand Sit to Stand: Min assist           General transfer comment: cues for hand placement on RW. assist to rise from EOB.    Ambulation/Gait Ambulation/Gait assistance: Min assist Gait Distance (Feet): 5 Feet Assistive device: Rolling walker (2 wheels) Gait Pattern/deviations: Step-through pattern, Decreased step length - right, Decreased step length - left, Decreased stride length, Shuffle, Trunk flexed, Narrow base of support Gait velocity: decr     General Gait Details: assit to steady and cues fro step pattern to direct forward gait and turn to recliner.   Stairs             Wheelchair Mobility    Modified Rankin (Stroke Patients Only)       Balance                                            Cognition Arousal/Alertness: Awake/alert Behavior During Therapy: WFL for tasks assessed/performed Overall Cognitive Status: No family/caregiver present to determine baseline cognitive functioning                                 General Comments: pt very pleasant and sweet. tangetal in conversation, fixated on where her daughter is.        Exercises      General Comments        Pertinent Vitals/Pain Pain Assessment Pain Assessment: Faces Faces Pain Scale: Hurts little more Pain Location: back,  abdomen Pain Descriptors / Indicators: Discomfort, Grimacing, Moaning Pain Intervention(s): Limited activity within patient's tolerance, Monitored during session, Repositioned, Heat applied (warm blankets provided)    Home Living                          Prior Function            PT Goals (current goals can now be found in the care plan section) Acute Rehab PT Goals Patient Stated Goal: less pain PT Goal Formulation: With patient Time For Goal Achievement: 11/03/21 Potential to Achieve Goals:  Good    Frequency    Min 3X/week      PT Plan      Co-evaluation              AM-PAC PT "6 Clicks" Mobility   Outcome Measure  Help needed turning from your back to your side while in a flat bed without using bedrails?: A Little Help needed moving from lying on your back to sitting on the side of a flat bed without using bedrails?: A Little Help needed moving to and from a bed to a chair (including a wheelchair)?: A Little Help needed standing up from a chair using your arms (e.g., wheelchair or bedside chair)?: A Little Help needed to walk in hospital room?: A Little Help needed climbing 3-5 steps with a railing? : A Lot 6 Click Score: 17    End of Session Equipment Utilized During Treatment: Gait belt Activity Tolerance: Patient tolerated treatment well Patient left: in chair;with call bell/phone within reach;with chair alarm set Nurse Communication: Mobility status PT Visit Diagnosis: Muscle weakness (generalized) (M62.81);Difficulty in walking, not elsewhere classified (R26.2)     Time: 1243-1310 PT Time Calculation (min) (ACUTE ONLY): 27 min  Charges:  $Therapeutic Activity: 23-37 mins                     Wynn Maudlin, DPT Acute Rehabilitation Services Office [phone] Pager [phone]  10/22/21 4:26 PM

## 2021-10-22 NOTE — NC FL2 (Signed)
[place] MEDICAID FL2 LEVEL OF CARE SCREENING TOOL     IDENTIFICATION  Patient Name: [name] [name] Birthdate: 09/29/1940 Sex: female Admission Date (Current Location): 10/15/2021  [facility] and [place] Number:  Producer, television/film/video and Address:  [facility],  [address], [place] [address]      Provider Number: [id number]  Attending Physician Name and Address:  Hughie Closs, MD  Relative Name and Phone Number:  Erenest Blank (CVE)[phone]    Current Level of Care: Hospital Recommended Level of Care: Skilled Nursing Facility Prior Approval Number:    Date Approved/Denied:   PASRR Number: [id number]  Discharge Plan: SNF    Current Diagnoses: Patient Active Problem List   Diagnosis Date Noted   Chronic systolic heart failure (HCC)    QT prolongation    Pressure injury of skin 10/16/2021   Atrial fibrillation with RVR (HCC) 10/15/2021   Acute metabolic encephalopathy 10/15/2021   Protein-calorie malnutrition, severe 04/24/2019   Hypokalemia 04/20/2019   Hypomagnesemia 04/20/2019   Anemia 04/20/2019   Patient is Jehovah's Witness 04/20/2019   Secondary hypercoagulable state (HCC) 04/05/2019   Malnutrition of moderate degree 05/17/2016   Bigeminy    Intractable vomiting 05/15/2016   Heart palpitations 05/15/2016   SBO (small bowel obstruction) (HCC) 05/15/2016   Acute coronary syndrome (HCC) 07/15/2015   Status post Maze operation for atrial fibrillation 01/08/2013   S/P MVR (mitral valve repair) 01/08/2013   Atrial flutter (HCC) 10/30/2012   Amaurosis fugax 10/30/2012   Dyspnea 10/28/2012   Hyperthyroidism 08/21/2012   Thyroid goiter 08/21/2012   Microscopic hematuria 08/20/2012   S/P mitral valve repair 06/28/2012   History of mitral valve prolapse with severe mitral regurgtation 05/19/2012   Refusal of blood transfusions as patient is Jehovah's Witness 05/19/2012   Atrial fibrillation (HCC) 05/16/2012   Anxiety 04/27/2011    Hypertension 04/26/2011   Diverticulitis 04/24/2011   Hyperlipidemia     Orientation RESPIRATION BLADDER Height & Weight     Self, Place  Normal Continent Weight: 43 kg Height:  5\' 6"  (167.6 cm)  BEHAVIORAL SYMPTOMS/MOOD NEUROLOGICAL BOWEL NUTRITION STATUS      Continent Diet (Heart Healthy)  AMBULATORY STATUS COMMUNICATION OF NEEDS Skin   Limited Assist Verbally Normal                       Personal Care Assistance Level of Assistance  Bathing, Feeding, Dressing Bathing Assistance: Limited assistance Feeding assistance: Limited assistance Dressing Assistance: Limited assistance     Functional Limitations Info  Sight, Hearing, Speech Sight Info: Impaired (eyeglasses) Hearing Info: Adequate Speech Info: Impaired (Partials-top/bottom)    SPECIAL CARE FACTORS FREQUENCY  PT (By licensed PT), OT (By licensed OT)     PT Frequency:  (5x week) OT Frequency:  (5x week)            Contractures Contractures Info: Not present    Additional Factors Info  Code Status, Allergies, Psychotropic Code Status Info:  (Full) Allergies Info:  (Amiodarone, Diltiazem, Macrobid (Nitrofurantoin Monohyd Macro), Mexiletine, Aspirin, Naproxen Sodium, Sulfa Drugs Cross Reactors) Psychotropic Info:  (haldol)         Current Medications (10/22/2021):  This is the current hospital active medication list Current Facility-Administered Medications  Medication Dose Route Frequency Provider Last Rate Last Admin   acetaminophen (TYLENOL) tablet 650 mg  650 mg Oral Q6H PRN 10/24/2021, MD   650 mg at 10/21/21 1352   Or   acetaminophen (TYLENOL)  suppository 650 mg  650 mg Rectal Q6H PRN Lewayne Bunting, MD       alum & mag hydroxide-simeth (MAALOX/MYLANTA) 200-200-20 MG/5ML suspension 15 mL  15 mL Oral Q4H PRN Hughie Closs, MD   15 mL at 10/22/21 0930   carvedilol (COREG) tablet 6.25 mg  6.25 mg Oral BID WC Chilton Si, MD   6.25 mg at 10/22/21 0957   feeding supplement (ENSURE  ENLIVE / ENSURE PLUS) liquid 237 mL  237 mL Oral BID BM Lewayne Bunting, MD   237 mL at 10/22/21 0931   haloperidol lactate (HALDOL) injection 5 mg  5 mg Intravenous Q6H PRN Hughie Closs, MD       HYDROcodone-acetaminophen (NORCO/VICODIN) 5-325 MG per tablet 1-2 tablet  1-2 tablet Oral Q4H PRN Lewayne Bunting, MD   1 tablet at 10/22/21 1240   hydrocortisone cream 1 % 1 Application  1 Application Topical TID PRN Lewayne Bunting, MD       lip balm (CARMEX) ointment   Topical PRN Lewayne Bunting, MD       losartan (COZAAR) tablet 12.5 mg  12.5 mg Oral Daily Chilton Si, MD   12.5 mg at 10/22/21 0929   metoprolol tartrate (LOPRESSOR) injection 5 mg  5 mg Intravenous Q6H PRN Lewayne Bunting, MD   5 mg at 10/17/21 1647   multivitamin with minerals tablet 1 tablet  1 tablet Oral Daily Lewayne Bunting, MD   1 tablet at 10/21/21 1332   OLANZapine (ZYPREXA) tablet 5 mg  5 mg Oral Q24H Hughie Closs, MD   5 mg at 10/21/21 2303   Oral care mouth rinse  15 mL Mouth Rinse PRN Lewayne Bunting, MD       polyethylene glycol (MIRALAX / GLYCOLAX) packet 17 g  17 g Oral Daily Hughie Closs, MD   17 g at 10/22/21 8413   Rivaroxaban (XARELTO) tablet 15 mg  15 mg Oral Q supper Lewayne Bunting, MD   15 mg at 10/21/21 1916   sotalol (BETAPACE) tablet 80 mg  80 mg Oral Q12H Lewayne Bunting, MD   80 mg at 10/22/21 0930     Discharge Medications: Please see discharge summary for a list of discharge medications.  Relevant Imaging Results:  Relevant Lab Results:   Additional Information ss#[id number]  Marita Burnsed, Olegario Messier, [place]

## 2021-10-22 NOTE — TOC Progression Note (Signed)
Transition of Care [facility]) - Progression Note    Patient Details  Name: [name] [name] MRN: [id number] Date of Birth: [birth year]  [facility] CM/SW Contact  Dwyne Hasegawa, Olegario Messier, RN Phone Number: 10/22/2021, 2:02 PM  Clinical Narrative: Sherron Monday to dtr Marylene Land about recc ST SNF-faxed out await bed offers prior auth.Has gone to [facility](preferred).      Expected Discharge Plan: Skilled Nursing Facility Barriers to Discharge: Continued Medical Work up  Expected Discharge Plan and Services Expected Discharge Plan: Skilled Nursing Facility   Discharge Planning Services: CM Consult Post Acute Care Choice: Skilled Nursing Facility Living arrangements for the past 2 months: Single Family Home                                       Social Determinants of Health (SDOH) Interventions    Readmission Risk Interventions     No data to display

## 2021-10-22 NOTE — Plan of Care (Signed)
  Problem: Clinical Measurements: Goal: Diagnostic test results will improve Outcome: Progressing Goal: Respiratory complications will improve Outcome: Progressing Goal: Cardiovascular complication will be avoided Outcome: Progressing   

## 2021-10-22 NOTE — Progress Notes (Signed)
PROGRESS NOTE    [name] [name]  GUY:[id number] DOB: 10-06-1940 DOA: 10/15/2021 PCP: Geoffry [place], MD   Brief Narrative:  HPI: [name] [name] is a 81 y.o. female with medical history significant of afib, hyperthyroidism, HTN. Her history is from her daughter as she is a little confused at this point. Her daughter reports that she had a UTI and hematuria last week. She was started on augmentin. Her urine clears, but she remained weak. When her daughter would check in on her, she complained of abdominal pain and poor appetite over the last few days. During this time, she has become more confused. When her symptoms did not improve today, her daughter brought her into the ED for evaluation.    Of note per the EDP, the patient has someone who checks in on her and apparently gave her advice to hold her xarelto because of the blood in her urine. In addition, she apparently has not been taking her sotalol as well. She has no reason as to why.  Assessment & Plan:   Principal Problem:   Atrial fibrillation with RVR (HCC) Active Problems:   Hypertension   Anxiety   Hyperthyroidism   Acute metabolic encephalopathy   Pressure injury of skin   Chronic systolic heart failure (HCC)   QT prolongation  A-fib with RVR: Rate still uncontrolled despite of being on 50 mg p.o. every 6 hours metoprolol and sotalol which was resumed by cardiology 10/17/2021.  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  Underwent successful TEE/DCCV on 10/19/2021, currently in normal sinus rhythm but she continues to have PVCs which is being managed by cardiology who are modifying beta-blocker and has transitioned the patient to Coreg 6.125 mg twice daily.  Remains on sotalol,  Continue Xarelto.  PT OT recommended SNF, TOC consulted, patient agreeable with going to the SNF and so is her daughter who I spoke to over the phone.  It appears that cardiology has signed off however I have  requested Dr. Duke Salvia to stay on the case until patient's PVCs are controlled.  Prolonged QTc: Monitor closely.  Avoiding QTc prolonging medications.  Unfortunately, Haldol is the only thing to prevent agitation or treat agitation in patients with delirium.  Benzodiazepines are the other options but they tend to worsen delirium.  Patient has not required any Haldol in last 24 hours/since order was placed.  Acute metabolic encephalopathy/delirium: Reportedly patient was confused upon admission.  She does have intermittent confusion.  I spoke to her her daughter and she tells me that patient does have dementia although she is not formally tested but she tends to forget things easily.  Patient has remained alert and oriented for last several days.  Per reports, patient was confused the night before.  However patient has remained alert and oriented every time I have seen her, yesterday or event today.  To prevent and treat delirium, I started on Seroquel but per cardiology recommendations, I transition to olanzapine.  We will continue that.  Subclinical hyperthyroidism: TSH is low but T3-T4 normal.  Essential hypertension: Blood pressure controlled, continue losartan and metoprolol.  DVT prophylaxis: Place and maintain sequential compression device Start: 10/17/21 1132 Xarelto   Code Status: Full Code  Family Communication:  None present at bedside.  Discussed with patient's daughter over the phone.  Status is: Inpatient Remains inpatient appropriate because: Medically stable, waiting for bed placement at SNF.   Estimated body mass index is 15.3 kg/m as calculated  from the following:   Height as of this encounter: 5\' 6"  (1.676 m).   Weight as of this encounter: 43 kg.  Pressure Injury 10/15/21 Coccyx Left Stage 1 -  Intact skin with non-blanchable redness of a localized area usually over a bony prominence. (Active)  10/15/21 1619  Location: Coccyx  Location Orientation: Left  Staging: Stage 1  -  Intact skin with non-blanchable redness of a localized area usually over a bony prominence.  Wound Description (Comments):   Present on Admission: Yes  Dressing Type Foam - Lift dressing to assess site every shift 10/21/21 2300   Nutritional Assessment: Body mass index is 15.3 kg/m.[name] Seen by dietician.  I agree with the assessment and plan as outlined below: Nutrition Status: Nutrition Problem: Moderate Malnutrition Etiology: chronic illness Signs/Symptoms: moderate fat depletion, severe muscle depletion Interventions: Ensure Enlive (each supplement provides 350kcal and 20 grams of protein), MVI  . Skin Assessment: I have examined the patient's skin and I agree with the wound assessment as performed by the wound care RN as outlined below: Pressure Injury 10/15/21 Coccyx Left Stage 1 -  Intact skin with non-blanchable redness of a localized area usually over a bony prominence. (Active)  10/15/21 1619  Location: Coccyx  Location Orientation: Left  Staging: Stage 1 -  Intact skin with non-blanchable redness of a localized area usually over a bony prominence.  Wound Description (Comments):   Present on Admission: Yes  Dressing Type Foam - Lift dressing to assess site every shift 10/21/21 2300    Consultants:  Cardiology  Procedures:  None  Antimicrobials:  Anti-infectives (From admission, onward)    Start     Dose/Rate Route Frequency Ordered Stop   10/15/21 0900  cefTRIAXone (ROCEPHIN) 2 g in sodium chloride 0.9 % 100 mL IVPB        2 g 200 mL/hr over 30 Minutes Intravenous  Once 10/15/21 0857 10/15/21 1037         Subjective:  Patient seen and examined.  She has no complaints.  She is fully alert and oriented.  She is not on restraints.  She does not have sitter.  Objective: Vitals:   10/21/21 2100 10/21/21 2306 10/22/21 0957 10/22/21 1148  BP: (!) 141/52 107/76  (!) 103/52  Pulse:   72 66  Resp: 19 (!) 21    Temp: 98 F (36.7 C)   97.6 F (36.4 C)   TempSrc: Oral   Oral  SpO2:      Weight:      Height:        Intake/Output Summary (Last 24 hours) at 10/22/2021 1223 Last data filed at 10/22/2021 0300 Gross per 24 hour  Intake 360 ml  Output 1300 ml  Net -940 ml    Filed Weights   10/15/21 0845 10/19/21 1300  Weight: 43 kg 43 kg    Examination:  General exam: Appears calm and comfortable  Respiratory system: Clear to auscultation. Respiratory effort normal. Cardiovascular system: S1 & S2 heard, RRR. No JVD, murmurs, rubs, gallops or clicks. No pedal edema. Gastrointestinal system: Abdomen is nondistended, soft and nontender. No organomegaly or masses felt. Normal bowel sounds heard. Central nervous system: Alert and oriented. No focal neurological deficits. Extremities: Symmetric 5 x 5 power. Skin: No rashes, lesions or ulcers.  Psychiatry: Judgement and insight appear poor Data Reviewed: I have personally reviewed following labs and imaging studies  CBC: Recent Labs  Lab 10/16/21 0526 10/18/21 0733  WBC 12.1* 10.4  NEUTROABS  --  6.5  HGB 13.2 13.2  HCT 39.6 40.3  MCV 90.2 91.4  PLT 258 258    Basic Metabolic Panel: Recent Labs  Lab 10/16/21 0526 10/18/21 0733 10/21/21 0752  NA 137 139 141  K 4.1 4.0 3.7  CL 103 105 106  CO2 26 28 27   GLUCOSE 111* 118* 103*  BUN 16 25* 12  CREATININE 0.77 0.99 0.69  CALCIUM 10.3 10.3 10.0    GFR: Estimated Creatinine Clearance: 38.1 mL/min (by C-G formula based on SCr of 0.69 mg/dL). Liver Function Tests: Recent Labs  Lab 10/16/21 0526  AST 16  ALT 9  ALKPHOS 47  BILITOT 1.2  PROT 7.0  ALBUMIN 3.7    No results for input(s): "LIPASE", "AMYLASE" in the last 168 hours. No results for input(s): "AMMONIA" in the last 168 hours. Coagulation Profile: No results for input(s): "INR", "PROTIME" in the last 168 hours.  Cardiac Enzymes: No results for input(s): "CKTOTAL", "CKMB", "CKMBINDEX", "TROPONINI" in the last 168 hours. BNP (last 3 results) No results  for input(s): "PROBNP" in the last 8760 hours. HbA1C: No results for input(s): "HGBA1C" in the last 72 hours. CBG: No results for input(s): "GLUCAP" in the last 168 hours. Lipid Profile: No results for input(s): "CHOL", "HDL", "LDLCALC", "TRIG", "CHOLHDL", "LDLDIRECT" in the last 72 hours. Thyroid Function Tests: No results for input(s): "TSH", "T4TOTAL", "FREET4", "T3FREE", "THYROIDAB" in the last 72 hours.  Anemia Panel: No results for input(s): "VITAMINB12", "FOLATE", "FERRITIN", "TIBC", "IRON", "RETICCTPCT" in the last 72 hours. Sepsis Labs: Recent Labs  Lab 10/15/21 1421  PROCALCITON <0.10     Recent Results (from the past 240 hour(s))  Urine Culture     Status: None   Collection Time: 10/15/21  8:57 AM   Specimen: In/Out Cath Urine  Result Value Ref Range Status   Specimen Description   Final    IN/OUT CATH URINE Performed at [facility], [address], [place], [place]    Special Requests   Final    NONE Performed at [facility], [address], [place], [place]    Culture   Final    NO GROWTH Performed at [facility], [address], [place], [place]    Report Status 10/16/2021 FINAL  Final  Blood Culture (routine x 2)     Status: None   Collection Time: 10/15/21  9:10 AM   Specimen: Right Antecubital; Blood  Result Value Ref Range Status   Specimen Description   Final    RIGHT ANTECUBITAL BLOOD Performed at [facility], [address] [place], [place]    Special Requests   Final    Blood Culture adequate volume BOTTLES DRAWN AEROBIC AND ANAEROBIC Performed at [facility], [address] [place], [place]    Culture   Final    NO GROWTH 5 DAYS Performed at [facility], [address] [place], [place]    Report Status 10/20/2021 FINAL  Final  Blood Culture (routine x 2)     Status: None   Collection  Time: 10/15/21  9:15 AM   Specimen: BLOOD LEFT WRIST  Result Value Ref Range Status   Specimen Description   Final    BLOOD LEFT WRIST BLOOD Performed at [facility], [address] [place], [place]    Special Requests   Final    Blood Culture adequate volume BOTTLES DRAWN  AEROBIC ONLY Performed at [facility], [place] [address], [place], [place] [address]    Culture   Final    NO GROWTH 5 DAYS Performed at [facility], [place] [address], [place],  [address]    Report Status 10/20/2021 FINAL  Final     Radiology Studies: No results found.  Scheduled Meds:  carvedilol  6.25 mg Oral BID WC   feeding supplement  237 mL Oral BID BM   losartan  12.5 mg Oral Daily   multivitamin with minerals  1 tablet Oral Daily   OLANZapine  5 mg Oral Q24H   polyethylene glycol  17 g Oral Daily   Rivaroxaban  15 mg Oral Q supper   sotalol  80 mg Oral Q12H   Continuous Infusions:   LOS: 7 days   Darliss Cheney, MD [facility]  10/22/2021, 12:23 PM   *Please note that this is a verbal dictation therefore any spelling or grammatical errors are due to the "[employer]" system interpretation.  Please page via [place] and do not message via secure chat for urgent patient care matters. Secure chat can be used for non urgent patient care matters.  How to contact the [facility] Attending or Consulting provider [place] or covering provider during after hours [place], for this patient?  Check the care team in [facility] and look for a) attending/consulting [facility] provider listed and b) the [facility] team listed. Page or secure chat [phone]. Log into [email or web address] and use [place]'s universal password to access. If you do not have the password, please contact the hospital operator. Locate the [facility] provider you are looking for under [facility] and page to a number that you can be directly reached. If you still have difficulty reaching the provider,  please page the [facility] (Director on Call) for the Hospitalists listed on amion for assistance.

## 2021-10-22 NOTE — Progress Notes (Signed)
   10/22/21 1826  Urine Characteristics  Urinary Interventions Bladder scan  Bladder Scan Volume (mL) 273 mL   After bladder scan, patient was transferred to the bedside commode and had 1 urine occurrence.  [name] [name]

## 2021-10-22 NOTE — Progress Notes (Signed)
Progress Note  Patient Name: [name] [name] Date of Encounter: 10/22/2021  [facility] Cardiologist: Dietrich Pates, MD   Subjective   Not feeling well.  Confused.    Inpatient Medications    Scheduled Meds:  feeding supplement  237 mL Oral BID BM   losartan  12.5 mg Oral Daily   metoprolol succinate  75 mg Oral Daily   multivitamin with minerals  1 tablet Oral Daily   OLANZapine  5 mg Oral Q24H   polyethylene glycol  17 g Oral Daily   Rivaroxaban  15 mg Oral Q supper   sotalol  80 mg Oral Q12H   Continuous Infusions:  PRN Meds: acetaminophen **OR** acetaminophen, haloperidol lactate, HYDROcodone-acetaminophen, hydrocortisone cream, lip balm, metoprolol tartrate, mouth rinse   Vital Signs    Vitals:   10/21/21 0900 10/21/21 1300 10/21/21 2100 10/21/21 2306  BP:   (!) 141/52 107/76  Pulse:      Resp:   19 (!) 21  Temp: 98 F (36.7 C) 97.6 F (36.4 C) 98 F (36.7 C)   TempSrc: Oral Oral Oral   SpO2:      Weight:      Height:        Intake/Output Summary (Last 24 hours) at 10/22/2021 0842 Last data filed at 10/22/2021 0300 Gross per 24 hour  Intake 360 ml  Output 1300 ml  Net -940 ml      10/19/2021    1:00 PM 10/15/2021    8:45 AM 09/17/2019    3:17 PM  Last 3 Weights  Weight (lbs) 94 lb 12.8 oz 94 lb 12.8 oz 92 lb 9.6 oz  Weight (kg) 43 kg 43 kg 42.003 kg      Telemetry    Sinus rhythm.  PVCs QTc 432 ms- Personally Reviewed  ECG    N/a - Personally Reviewed  Physical Exam   VS:  BP 107/76   Pulse 63   Temp 98 F (36.7 C) (Oral)   Resp (!) 21   Ht 5\' 6"  (1.676 m)   Wt 43 kg   SpO2 99%   BMI 15.30 kg/m  , BMI Body mass index is 15.3 kg/m. GENERAL:  Frail.  Mildly confused HEENT: Pupils equal round and reactive, fundi not visualized, oral mucosa unremarkable NECK:  No jugular venous distention, waveform within normal limits, carotid upstroke brisk and symmetric, no bruits, no thyromegaly LUNGS:  Clear to auscultation  bilaterally HEART:  Mostly regular with occasional ectopy.  PMI not displaced or sustained,S1 and S2 within normal limits, no S3, no S4, no clicks, no rubs, no murmurs ABD:  Flat, positive bowel sounds normal in frequency in pitch, no bruits, no rebound, no guarding, no midline pulsatile mass, no hepatomegaly, no splenomegaly EXT:  2 plus pulses throughout, no edema, no cyanosis no clubbing SKIN:  No rashes no nodules NEURO:  Cranial nerves II through XII grossly intact, motor grossly intact throughout PSYCH:  Oriented to person and place.  Not time.   Labs    High Sensitivity Troponin:   Recent Labs  Lab 10/15/21 0929 10/15/21 1135  TROPONINIHS 13 10     Chemistry Recent Labs  Lab 10/15/21 0928 10/15/21 0929 10/16/21 0526 10/18/21 0733 10/21/21 0752  NA 134*  --  137 139 141  K 4.1  --  4.1 4.0 3.7  CL 101  --  103 105 106  CO2 25  --  26 28 27   GLUCOSE 110*  --  111* 118* 103*  BUN 15  --  16 25* 12  CREATININE 0.69  --  0.77 0.99 0.69  CALCIUM 10.3  --  10.3 10.3 10.0  MG  --  2.0  --   --   --   PROT 8.1  --  7.0  --   --   ALBUMIN 4.1  --  3.7  --   --   AST 17  --  16  --   --   ALT 10  --  9  --   --   ALKPHOS 53  --  47  --   --   BILITOT 1.3*  --  1.2  --   --   GFRNONAA >60  --  >60 58* >60  ANIONGAP 8  --  8 6 8     Lipids No results for input(s): "CHOL", "TRIG", "HDL", "LABVLDL", "LDLCALC", "CHOLHDL" in the last 168 hours.  Hematology Recent Labs  Lab 10/15/21 0928 10/16/21 0526 10/18/21 0733  WBC 9.0 12.1* 10.4  RBC 4.77 4.39 4.41  HGB 14.3 13.2 13.2  HCT 43.3 39.6 40.3  MCV 90.8 90.2 91.4  MCH 30.0 30.1 29.9  MCHC 33.0 33.3 32.8  RDW 13.4 13.4 14.0  PLT 246 258 258   Thyroid  Recent Labs  Lab 10/15/21 0929 10/15/21 1037  TSH 0.238*  --   FREET4  --  0.96    BNPNo results for input(s): "BNP", "PROBNP" in the last 168 hours.  DDimer No results for input(s): "DDIMER" in the last 168 hours.   Radiology    No results found.  Cardiac  Studies   TEE 10/19/21:  1. Definity used to assess left atrial appendage; no thrombus noted.   2. Left ventricular ejection fraction, by estimation, is 40 to 45%. The  left ventricle has mildly decreased function. The left ventricle  demonstrates global hypokinesis.   3. Right ventricular systolic function is normal. The right ventricular  size is normal.   4. Left atrial size was mildly dilated. No left atrial/left atrial  appendage thrombus was detected.   5. The mitral valve has been repaired/replaced. Mild mitral valve  regurgitation. No evidence of mitral stenosis. There is a present in the  mitral position.   6. The aortic valve is tricuspid. Aortic valve regurgitation is mild.  Aortic valve sclerosis is present, with no evidence of aortic valve  stenosis.    TTE 10/16/21:  1. Left ventricular ejection fraction, by estimation, is 40 to 45%. The  left ventricle has mildly decreased function. The left ventricle  demonstrates regional wall motion abnormalities (see scoring  diagram/findings for description). Left ventricular  diastolic function could not be evaluated. There is severe hypokinesis of  the left ventricular, entire anteroseptal wall.   2. Right ventricular systolic function is normal. The right ventricular  size is small.   3. Left atrial size was mildly dilated.   4. The mitral valve is grossly normal. Mild mitral valve regurgitation.   5. Calcification of the non coronary cusp. . The aortic valve is  calcified. Aortic valve regurgitation is mild.   6. The inferior vena cava is normal in size with greater than 50%  respiratory variability, suggesting right atrial pressure of 3 mmHg.   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    Assessment & Plan    #  Persistent atrial fibrillation:  Now in sinus rhythm.  She continues to have frequent PVCs.  Increasing metoprolol to 75mg  today.  QTc 432 ms.  Avoid other QT prolonging medications (zofran, Zoloft, haldol, etc) if able.  Monitor QTc closely while on these agents given that she is on sotalol.  It is easiest to calculated QTc on telemetry between beats that do not contain a PVC. Of note, her appendage was not ligated despite the fact that it was sutured at the time of her MAZE and valve repair in 2014.  QTc was 2015.  Continue metoprolol, sotalol and Xarelto.   # Hypertension:  Losartan reduced 2/2 low BP.    [facility] will sign off.   Medication Recommendations:  continue metoprolol and losartan as above Other recommendations (labs, testing, etc):  none Follow up as an outpatient:  we will arrange  For questions or updates, please contact [facility] Please consult [email or web address] for contact info under        Signed, , MD  10/22/2021, 8:42 AM

## 2021-10-23 DIAGNOSIS — I4891 Unspecified atrial fibrillation: Secondary | ICD-10-CM | POA: Diagnosis not present

## 2021-10-23 DIAGNOSIS — I5022 Chronic systolic (congestive) heart failure: Secondary | ICD-10-CM | POA: Diagnosis not present

## 2021-10-23 NOTE — Progress Notes (Signed)
Progress Note  Patient Name: [name] [name] Date of Encounter: 10/23/2021  [facility] Cardiologist: [name] Pates, MD   Subjective   No chest pain or shortness of breath.  Inpatient Medications    Scheduled Meds:  carvedilol  6.25 mg Oral BID WC   feeding supplement  237 mL Oral BID BM   losartan  12.5 mg Oral Daily   multivitamin with minerals  1 tablet Oral Daily   OLANZapine  5 mg Oral Q24H   polyethylene glycol  17 g Oral Daily   Rivaroxaban  15 mg Oral Q supper   sotalol  80 mg Oral Q12H   Continuous Infusions:  PRN Meds: acetaminophen **OR** acetaminophen, alum & mag hydroxide-simeth, haloperidol lactate, HYDROcodone-acetaminophen, hydrocortisone cream, lip balm, metoprolol tartrate, mouth rinse   Vital Signs    Vitals:   10/22/21 2100 10/22/21 2219 10/23/21 0400 10/23/21 0600  BP: (!) 124/53 (!) 114/56 (!) 111/50 (!) 122/55  Pulse:  76  73  Resp: 19 19 15 15   Temp:  98 F (36.7 C)  98.7 F (37.1 C)  TempSrc:  Oral  Oral  SpO2:  100%  100%  Weight:      Height:        Intake/Output Summary (Last 24 hours) at 10/23/2021 1126 Last data filed at 10/22/2021 2323 Gross per 24 hour  Intake 480 ml  Output --  Net 480 ml      10/19/2021    1:00 PM 10/15/2021    8:45 AM 09/17/2019    3:17 PM  Last 3 Weights  Weight (lbs) 94 lb 12.8 oz 94 lb 12.8 oz 92 lb 9.6 oz  Weight (kg) 43 kg 43 kg 42.003 kg      Telemetry    Sinus rhythm.  PVCs QTc 432 ms- Personally Reviewed  ECG    N/a - Personally Reviewed  Physical Exam   VS:  BP (!) 122/55 (BP Location: Left Arm)   Pulse 73   Temp 98.7 F (37.1 C) (Oral)   Resp 15   Ht 5\' 6"  (1.676 m)   Wt 43 kg   SpO2 100%   BMI 15.30 kg/m  , BMI Body mass index is 15.3 kg/m. GENERAL:  Frail.  Mildly confused HEENT: Pupils equal round and reactive, fundi not visualized, oral mucosa unremarkable NECK:  No jugular venous distention, waveform within normal limits, carotid upstroke brisk and symmetric, no  bruits, no thyromegaly LUNGS:  Clear to auscultation bilaterally HEART:  Mostly regular with occasional ectopy.  PMI not displaced or sustained,S1 and S2 within normal limits, no S3, no S4, no clicks, no rubs, no murmurs ABD:  Flat, positive bowel sounds normal in frequency in pitch, no bruits, no rebound, no guarding, no midline pulsatile mass, no hepatomegaly, no splenomegaly EXT:  2 plus pulses throughout, no edema, no cyanosis no clubbing SKIN:  No rashes no nodules NEURO:  Cranial nerves II through XII grossly intact, motor grossly intact throughout PSYCH:  Oriented to person and place.  Not time.   Labs    High Sensitivity Troponin:   Recent Labs  Lab 10/15/21 0929 10/15/21 1135  TROPONINIHS 13 10     Chemistry Recent Labs  Lab 10/18/21 0733 10/21/21 0752  NA 139 141  K 4.0 3.7  CL 105 106  CO2 28 27  GLUCOSE 118* 103*  BUN 25* 12  CREATININE 0.99 0.69  CALCIUM 10.3 10.0  GFRNONAA 58* >60  ANIONGAP 6 8    Lipids No  results for input(s): "CHOL", "TRIG", "HDL", "LABVLDL", "LDLCALC", "CHOLHDL" in the last 168 hours.  Hematology Recent Labs  Lab 10/18/21 0733  WBC 10.4  RBC 4.41  HGB 13.2  HCT 40.3  MCV 91.4  MCH 29.9  MCHC 32.8  RDW 14.0  PLT 258   Thyroid  No results for input(s): "TSH", "FREET4" in the last 168 hours.   BNPNo results for input(s): "BNP", "PROBNP" in the last 168 hours.  DDimer No results for input(s): "DDIMER" in the last 168 hours.   Radiology    No results found.  Cardiac Studies   TEE 10/19/21:  1. Definity used to assess left atrial appendage; no thrombus noted.   2. Left ventricular ejection fraction, by estimation, is 40 to 45%. The  left ventricle has mildly decreased function. The left ventricle  demonstrates global hypokinesis.   3. Right ventricular systolic function is normal. The right ventricular  size is normal.   4. Left atrial size was mildly dilated. No left atrial/left atrial  appendage thrombus was detected.    5. The mitral valve has been repaired/replaced. Mild mitral valve  regurgitation. No evidence of mitral stenosis. There is a present in the  mitral position.   6. The aortic valve is tricuspid. Aortic valve regurgitation is mild.  Aortic valve sclerosis is present, with no evidence of aortic valve  stenosis.    TTE 10/16/21:  1. Left ventricular ejection fraction, by estimation, is 40 to 45%. The  left ventricle has mildly decreased function. The left ventricle  demonstrates regional wall motion abnormalities (see scoring  diagram/findings for description). Left ventricular  diastolic function could not be evaluated. There is severe hypokinesis of  the left ventricular, entire anteroseptal wall.   2. Right ventricular systolic function is normal. The right ventricular  size is small.   3. Left atrial size was mildly dilated.   4. The mitral valve is grossly normal. Mild mitral valve regurgitation.   5. Calcification of the non coronary cusp. . The aortic valve is  calcified. Aortic valve regurgitation is mild.   6. The inferior vena cava is normal in size with greater than 50%  respiratory variability, suggesting right atrial pressure of 3 mmHg.   Patient Profile     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    Assessment & Plan    # Persistent atrial fibrillation:  Patient remains in sinus rhythm.  She continues to have frequent PVCs.  Yesterday we attempted to increase her metoprolol and consolidate to succinate.  However we were informed by pharmacy that metoprolol succinate cannot be crushed.  Therefore we switched to carvedilol.  She continues to have PVCs.  Blood pressure and heart rate are stable.  QTc is stable.  Continue to avoid other QT prolonging medications (zofran, Zoloft, haldol,  etc) if able.  Of note, her appendage was not ligated despite the fact that it was sutured at the time of her MAZE and valve repair in 2014.  QTc was .  Continue carvedilol sotalol and Xarelto.   #Chronic systolic and diastolic heart failure: # Hypertension:  LVEF 40 to 45%.  She is euvolemic.  Blood pressure is well controlled on losartan and carvedilol.  She has no unstable cardiac conditions.  Cardiology will again sign off.  [facility] will sign off.   Medication Recommendations: No changes Other recommendations (  labs, testing, etc):  none Follow up as an outpatient: Scheduled for 10/29/2021  For questions or updates, please contact [facility] Please consult [email or web address] for contact info under        Signed, Chilton Si, MD  10/23/2021, 11:26 AM

## 2021-10-23 NOTE — Care Management Important Message (Signed)
Important Message  Patient Details  Name: [name] [name] MRN: [id number] Date of Birth: May 16, 1940   Medicare Important Message Given:  Yes     Mardene Sayer 10/23/2021, 2:11 PM

## 2021-10-23 NOTE — Progress Notes (Signed)
Physical Therapy Treatment Patient Details Name: [name] [name] MRN: [id number] DOB: 09-12-1940 Today's Date: 10/23/2021   History of Present Illness Patient is a 81 year old female who was admitted with A fib with RVR, acute metabolic encephalopathy,and subclinical hypothyroidism. PMH: a fib, severe MR s/p MV repair, systolic CHF, anxiety disorder, hyperlipidemia,    PT Comments    Pt is progressing well with therapy. Currently pt requires Min assist for bed mobility and transfers. Pt able to step pivot from bed to chair with RW. Pt is limited by muscle weakness. She would benefit from continued skilled therapy by addressing above impairments with practicing therapeutic activities and HEP. Recommending SNF upon DC from hospital. Will continue to treat in acute setting.    Recommendations for follow up therapy are one component of a multi-disciplinary discharge planning process, led by the attending physician.  Recommendations may be updated based on patient status, additional functional criteria and insurance authorization.  Follow Up Recommendations  Skilled nursing-short term rehab (<3 hours/day) Can patient physically be transported by private vehicle: Yes   Assistance Recommended at Discharge Frequent or constant Supervision/Assistance  Patient can return home with the following A little help with walking and/or transfers;A little help with bathing/dressing/bathroom;Assistance with cooking/housework;Assistance with feeding;Direct supervision/assist for medications management;Direct supervision/assist for financial management;Assist for transportation;Help with stairs or ramp for entrance   Equipment Recommendations  None recommended by PT    Recommendations for Other Services       Precautions / Restrictions Precautions Precautions: Fall Restrictions Weight Bearing Restrictions: No     Mobility  Bed Mobility Overal bed mobility: Needs Assistance Bed Mobility: Supine to  Sit     Supine to sit: Min assist, HOB elevated     General bed mobility comments: Pt required VC's for hand placement on handrail/therapist hand for scooting to EOB. Pt relied on physical assist for trunk lift to EOB.     Transfers Overall transfer level: Needs assistance Equipment used: Rolling walker (2 wheels) Transfers: Sit to/from Stand Sit to Stand: Min assist   Step pivot transfers: Min assist       General transfer comment: Pt required VC's for stand up and for hand placement on RW once in standing. Pt able to move RW from bed to chair with step pivot transfer.     Ambulation/Gait                   Stairs             Wheelchair Mobility    Modified Rankin (Stroke Patients Only)       Balance Overall balance assessment: Needs assistance Sitting-balance support: Feet supported, Single extremity supported Sitting balance-Leahy Scale: Fair Sitting balance - Comments: Pt needed one hand on the bed for support and feet on floor for balancing. Postural control: Other (comment) (forward lean) Standing balance support: Bilateral upper extremity supported, During functional activity, Reliant on assistive device for balance Standing balance-Leahy Scale: Fair Standing balance comment: Pt relied on Bil UE support from RW for standing, no dizziness.                            Cognition Arousal/Alertness: Awake/alert Behavior During Therapy: WFL for tasks assessed/performed Overall Cognitive Status: Difficult to assess                                 General  Comments:  (Pt able to answer first/last name and date correctly, however struggled with sequencing of events, easily distracted from task instructed.)        Exercises      General Comments        Pertinent Vitals/Pain Pain Assessment Pain Assessment: 0-10 Pain Score: 4  Pain Location: back Pain Descriptors / Indicators: Sore, Discomfort Pain Intervention(s):  Monitored during session    Home Living Family/patient expects to be discharged to:: Skilled nursing facility Living Arrangements: Alone                      Prior Function            PT Goals (current goals can now be found in the care plan section) Acute Rehab PT Goals Patient Stated Goal: To go home PT Goal Formulation: With patient Time For Goal Achievement: 11/03/21 Potential to Achieve Goals: Good Progress towards PT goals: Progressing toward goals    Frequency    Min 3X/week      PT Plan Current plan remains appropriate    Co-evaluation              AM-PAC PT "6 Clicks" Mobility   Outcome Measure  Help needed turning from your back to your side while in a flat bed without using bedrails?: A Little Help needed moving from lying on your back to sitting on the side of a flat bed without using bedrails?: A Little Help needed moving to and from a bed to a chair (including a wheelchair)?: A Little Help needed standing up from a chair using your arms (e.g., wheelchair or bedside chair)?: A Little Help needed to walk in hospital room?: A Little Help needed climbing 3-5 steps with a railing? : A Lot 6 Click Score: 17    End of Session Equipment Utilized During Treatment: Gait belt Activity Tolerance: Patient tolerated treatment well Patient left: in chair;with call bell/phone within reach;with chair alarm set Nurse Communication: Mobility status PT Visit Diagnosis: Unsteadiness on feet (R26.81);Other abnormalities of gait and mobility (R26.89);Pain;Muscle weakness (generalized) (M62.81)     Time: 1345-1406 PT Time Calculation (min) (ACUTE ONLY): 21 min  Charges:  $Therapeutic Activity: 8-22 mins                    Lenn Cal, SPT Acute Rehab [facility] 10/23/2021, 4:29 PM

## 2021-10-23 NOTE — TOC Progression Note (Addendum)
Transition of Care [facility]) - Progression Note    Patient Details  Name: ALBIE BAZIN MRN: [id number] Date of Birth: 08-Mar-1941  [facility] CM/SW Contact  Golda Acre, RN Phone Number: 10/23/2021, 8:30 AM  Clinical Narrative:    List of accepted bed offers reviewed with patient. Awaiting choice. Patient and daughter prefer [facility].  Bed offer was made.  Tct-daughter to confirm but phone voice is full.unable to leave a message.  Will start auth with [employer].  Expected Discharge Plan: Skilled Nursing Facility Barriers to Discharge: Continued Medical Work up  Expected Discharge Plan and Services Expected Discharge Plan: Skilled Nursing Facility   Discharge Planning Services: CM Consult Post Acute Care Choice: Skilled Nursing Facility Living arrangements for the past 2 months: Single Family Home                                       Social Determinants of Health (SDOH) Interventions    Readmission Risk Interventions     No data to display

## 2021-10-23 NOTE — Progress Notes (Signed)
PROGRESS NOTE    [name] [name]  ACZ:[id number] DOB: Oct 22, 1940 DOA: 10/15/2021 PCP: Geoffry Paradise, MD   Brief Narrative:  HPI: [name] [name] is a 81 y.o. female with medical history significant of afib, hyperthyroidism, HTN. Her history is from her daughter as she is a little confused at this point. Her daughter reports that she had a UTI and hematuria last week. She was started on augmentin. Her urine clears, but she remained weak. When her daughter would check in on her, she complained of abdominal pain and poor appetite over the last few days. During this time, she has become more confused. When her symptoms did not improve today, her daughter brought her into the ED for evaluation.    Of note per the EDP, the patient has someone who checks in on her and apparently gave her advice to hold her xarelto because of the blood in her urine. In addition, she apparently has not been taking her sotalol as well. She has no reason as to why.  Assessment & Plan:   Principal Problem:   Atrial fibrillation with RVR (HCC) Active Problems:   Hypertension   Anxiety   Hyperthyroidism   Acute metabolic encephalopathy   Pressure injury of skin   Chronic systolic heart failure (HCC)   QT prolongation  A-fib with RVR: Rate still uncontrolled despite of being on 50 mg p.o. every 6 hours metoprolol and sotalol which was resumed by cardiology 10/17/2021.  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  Underwent successful TEE/DCCV on 10/19/2021, currently in normal sinus rhythm but she continues to have PVCs which is being managed by cardiology who are modifying beta-blocker and has transitioned the patient to Coreg 6.125 mg twice daily.  Remains on sotalol,  Continue Xarelto.  PT OT recommended SNF, TOC consulted, patient agreeable with going to the SNF and so is her daughter who I spoke to over the phone.  Still having PVCs.  Prolonged QTc: Monitor closely.   Avoiding QTc prolonging medications.  Unfortunately, Haldol is the only thing to prevent agitation or treat agitation in patients with delirium.  Benzodiazepines are the other options but they tend to worsen delirium.  Patient has not required any Haldol since the order was placed 2 days ago.  Continue olanzapine.  Acute metabolic encephalopathy/delirium: Reportedly patient was confused upon admission.  She does have intermittent confusion.  I spoke to her her daughter and she tells me that patient does have dementia although she is not formally tested but she tends to forget things easily.  Patient has remained alert and oriented for last several days during my evaluation, even today however, Per reports, patient has intermittent confusion which I have not noted myself.  Started on olanzapine to prevent delirium.  Patient is sleepy this morning but arousable.  Despite of this, she is once again fully oriented.  Reviewed the [facility] and it sounds like she was given olanzapine just before 1 AM instead of 9 PM last night, clearly reason for her to be sleepy this morning.  Subclinical hyperthyroidism: TSH is low but T3-T4 normal.  Essential hypertension: Blood pressure controlled, continue losartan and metoprolol.  Placement: TOC working on placement.  They have bed offers which they are waiting for daughter to decide and insurance authorization needs to be obtained.  Patient to be discharged in next 1 to 2 days.  DVT prophylaxis: Place and maintain sequential compression device Start: 10/17/21 1132 Xarelto   Code Status:  Full Code  Family Communication:  None present at bedside.  Discussed with patient's daughter over the phone.  Status is: Inpatient Remains inpatient appropriate because: Medically stable, waiting for bed placement at SNF.   Estimated body mass index is 15.3 kg/m as calculated from the following:   Height as of this encounter: 5\' 6"  (1.676 m).   Weight as of this encounter: 43  kg.  Pressure Injury 10/15/21 Coccyx Left Stage 1 -  Intact skin with non-blanchable redness of a localized area usually over a bony prominence. (Active)  10/15/21 1619  Location: Coccyx  Location Orientation: Left  Staging: Stage 1 -  Intact skin with non-blanchable redness of a localized area usually over a bony prominence.  Wound Description (Comments):   Present on Admission: Yes  Dressing Type None 10/22/21 2300   Nutritional Assessment: Body mass index is 15.3 kg/m.10/24/21 Seen by dietician.  I agree with the assessment and plan as outlined below: Nutrition Status: Nutrition Problem: Moderate Malnutrition Etiology: chronic illness Signs/Symptoms: moderate fat depletion, severe muscle depletion Interventions: Ensure Enlive (each supplement provides 350kcal and 20 grams of protein), MVI  . Skin Assessment: I have examined the patient's skin and I agree with the wound assessment as performed by the wound care RN as outlined below: Pressure Injury 10/15/21 Coccyx Left Stage 1 -  Intact skin with non-blanchable redness of a localized area usually over a bony prominence. (Active)  10/15/21 1619  Location: Coccyx  Location Orientation: Left  Staging: Stage 1 -  Intact skin with non-blanchable redness of a localized area usually over a bony prominence.  Wound Description (Comments):   Present on Admission: Yes  Dressing Type None 10/22/21 2300    Consultants:  Cardiology  Procedures:  As above  Antimicrobials:  Anti-infectives (From admission, onward)    Start     Dose/Rate Route Frequency Ordered Stop   10/15/21 0900  cefTRIAXone (ROCEPHIN) 2 g in sodium chloride 0.9 % 100 mL IVPB        2 g 200 mL/hr over 30 Minutes Intravenous  Once 10/15/21 0857 10/15/21 1037         Subjective:  Patient seen and examined.  She is sleepy but arousable.  No complaints.  Still fully oriented.  Objective: Vitals:   10/22/21 2100 10/22/21 2219 10/23/21 0400 10/23/21 0600  BP: (!)  124/53 (!) 114/56 (!) 111/50 (!) 122/55  Pulse:  76  73  Resp: 19 19 15 15   Temp:  98 F (36.7 C)  98.7 F (37.1 C)  TempSrc:  Oral  Oral  SpO2:  100%  100%  Weight:      Height:        Intake/Output Summary (Last 24 hours) at 10/23/2021 1138 Last data filed at 10/22/2021 2323 Gross per 24 hour  Intake 480 ml  Output --  Net 480 ml    Filed Weights   10/15/21 0845 10/19/21 1300  Weight: 43 kg 43 kg    Examination:  General exam: Appears calm and comfortable, slightly sleepy Respiratory system: Clear to auscultation. Respiratory effort normal. Cardiovascular system: S1 & S2 heard, RRR. No JVD, murmurs, rubs, gallops or clicks. No pedal edema. Gastrointestinal system: Abdomen is nondistended, soft and nontender. No organomegaly or masses felt. Normal bowel sounds heard. Central nervous system: Alert and oriented. No focal neurological deficits. Extremities: Symmetric 5 x 5 power. Skin: No rashes, lesions or ulcers.  Psychiatry: Judgement and insight appear poor  Data Reviewed: I have personally reviewed following labs  and imaging studies  CBC: Recent Labs  Lab 10/18/21 0733  WBC 10.4  NEUTROABS 6.5  HGB 13.2  HCT 40.3  MCV 91.4  PLT 0000000    Basic Metabolic Panel: Recent Labs  Lab 10/18/21 0733 10/21/21 0752  NA 139 141  K 4.0 3.7  CL 105 106  CO2 28 27  GLUCOSE 118* 103*  BUN 25* 12  CREATININE 0.99 0.69  CALCIUM 10.3 10.0    GFR: Estimated Creatinine Clearance: 38.1 mL/min (by C-G formula based on SCr of 0.69 mg/dL). Liver Function Tests: No results for input(s): "AST", "ALT", "ALKPHOS", "BILITOT", "PROT", "ALBUMIN" in the last 168 hours.  No results for input(s): "LIPASE", "AMYLASE" in the last 168 hours. No results for input(s): "AMMONIA" in the last 168 hours. Coagulation Profile: No results for input(s): "INR", "PROTIME" in the last 168 hours.  Cardiac Enzymes: No results for input(s): "CKTOTAL", "CKMB", "CKMBINDEX", "TROPONINI" in the last  168 hours. BNP (last 3 results) No results for input(s): "PROBNP" in the last 8760 hours. HbA1C: No results for input(s): "HGBA1C" in the last 72 hours. CBG: No results for input(s): "GLUCAP" in the last 168 hours. Lipid Profile: No results for input(s): "CHOL", "HDL", "LDLCALC", "TRIG", "CHOLHDL", "LDLDIRECT" in the last 72 hours. Thyroid Function Tests: No results for input(s): "TSH", "T4TOTAL", "FREET4", "T3FREE", "THYROIDAB" in the last 72 hours.  Anemia Panel: No results for input(s): "VITAMINB12", "FOLATE", "FERRITIN", "TIBC", "IRON", "RETICCTPCT" in the last 72 hours. Sepsis Labs: No results for input(s): "PROCALCITON", "LATICACIDVEN" in the last 168 hours.   Recent Results (from the past 240 hour(s))  Urine Culture     Status: None   Collection Time: 10/15/21  8:57 AM   Specimen: In/Out Cath Urine  Result Value Ref Range Status   Specimen Description   Final    IN/OUT CATH URINE Performed at [facility] [address], [place], [place] [address]    Special Requests   Final    NONE Performed at [facility], [place] [address] [place], [place] [address]    Culture   Final    NO GROWTH Performed at [facility], [place] [address] [place], [place] [address]    Report Status 10/16/2021 FINAL  Final  Blood Culture (routine x 2)     Status: None   Collection Time: 10/15/21  9:10 AM   Specimen: Right Antecubital; Blood  Result Value Ref Range Status   Specimen Description   Final    RIGHT ANTECUBITAL BLOOD Performed at [facility] [address] [place], [place] [address]    Special Requests   Final    Blood Culture adequate volume BOTTLES DRAWN AEROBIC AND ANAEROBIC Performed at [facility] [address], [place], [place] [address]    Culture   Final    NO GROWTH 5 DAYS Performed at [facility], [place] [address] [place], [place] [address]    Report Status 10/20/2021 FINAL   Final  Blood Culture (routine x 2)     Status: None   Collection Time: 10/15/21  9:15 AM   Specimen: BLOOD LEFT WRIST  Result Value Ref Range Status   Specimen Description   Final    BLOOD LEFT WRIST BLOOD Performed at [facility] [address] [place], [place] [address]    Special Requests   Final    Blood Culture adequate volume BOTTLES DRAWN AEROBIC ONLY Performed at [facility], [address] [place], [place] [address]    Culture   Final    NO GROWTH 5 DAYS Performed at [facility], [place] [address] [place], [place] [address]    Report Status 10/20/2021 FINAL  Final     Radiology Studies: No results found.  Scheduled Meds:  carvedilol  6.25 mg Oral BID WC   feeding supplement  237 mL Oral BID BM   losartan  12.5 mg Oral Daily   multivitamin with minerals  1 tablet Oral Daily   OLANZapine  5 mg Oral Q24H   polyethylene glycol  17 g Oral Daily   Rivaroxaban  15 mg Oral Q supper   sotalol  80 mg Oral Q12H   Continuous Infusions:   LOS: 8 days   Darliss Cheney, MD [facility]  10/23/2021, 11:38 AM   *Please note that this is a verbal dictation therefore any spelling or grammatical errors are due to the "[employer]" system interpretation.  Please page via [place] and do not message via secure chat for urgent patient care matters. Secure chat can be used for non urgent patient care matters.  How to contact the [facility] Attending or Consulting provider [place] or covering provider during after hours [place], for this patient?  Check the care team in [facility] and look for a) attending/consulting TRH provider listed and b) the [facility] team listed. Page or secure chat [phone]. Log into [email or web address] and use [place]'s universal password to access. If you do not have the password, please contact the hospital operator. Locate the [facility] provider you are looking for under [facility] and page to a number that you can be  directly reached. If you still have difficulty reaching the provider, please page the [facility] (Director on Call) for the Hospitalists listed on amion for assistance.

## 2021-10-23 NOTE — Plan of Care (Signed)
  Problem: Coping: Goal: Level of anxiety will decrease Outcome: Progressing   Problem: Elimination: Goal: Will not experience complications related to bowel motility Outcome: Progressing Goal: Will not experience complications related to urinary retention Outcome: Progressing   Problem: Pain Managment: Goal: General experience of comfort will improve Outcome: Progressing   Problem: Safety: Goal: Ability to remain free from injury will improve Outcome: Progressing   Problem: Skin Integrity: Goal: Risk for impaired skin integrity will decrease Outcome: Progressing   Problem: Safety: Goal: Non-violent Restraint(s) Outcome: Progressing   

## 2021-10-23 NOTE — Progress Notes (Signed)
Return call placed to patients daughter Marylene Land [phone]. Mailbox is full.

## 2021-10-23 NOTE — TOC Progression Note (Addendum)
Transition of Care [facility]) - Progression Note    Patient Details  Name: [name] [name] MRN: [id number] Date of Birth: July 07, 1940  Transition [facility] CM/SW Contact  Golda Acre, RN Phone Number: 10/23/2021, 11:51 AM  Clinical Narrative:    Tct stephanie at [facility]. Auth for [facility] started at 1154. Auth for [facility] obtained. [id number].  Good x 6 days Ambulance [facility] is [phone]. Good x30days. [facility] cambell at [facility] beds until Monday. Expected Discharge Plan: Skilled Nursing Facility Barriers to Discharge: Continued Medical Work up  Expected Discharge Plan and Services Expected Discharge Plan: Skilled Nursing Facility   Discharge Planning Services: CM Consult Post Acute Care Choice: Skilled Nursing Facility Living arrangements for the past 2 months: Single Family Home                                       Social Determinants of Health (SDOH) Interventions    Readmission Risk Interventions     No data to display

## 2021-10-24 DIAGNOSIS — G9341 Metabolic encephalopathy: Secondary | ICD-10-CM | POA: Diagnosis not present

## 2021-10-24 DIAGNOSIS — F419 Anxiety disorder, unspecified: Secondary | ICD-10-CM | POA: Diagnosis not present

## 2021-10-24 DIAGNOSIS — I4891 Unspecified atrial fibrillation: Secondary | ICD-10-CM | POA: Diagnosis not present

## 2021-10-24 DIAGNOSIS — I5042 Chronic combined systolic (congestive) and diastolic (congestive) heart failure: Secondary | ICD-10-CM

## 2021-10-24 LAB — BASIC METABOLIC PANEL
Anion gap: 5 (ref 5–15)
BUN: 26 mg/dL — ABNORMAL HIGH (ref 8–23)
CO2: 28 mmol/L (ref 22–32)
Calcium: 9.4 mg/dL (ref 8.9–10.3)
Chloride: 104 mmol/L (ref 98–111)
Creatinine, Ser: 0.84 mg/dL (ref 0.44–1.00)
GFR, Estimated: >60 mL/min (ref >60)
Glucose, Bld: 102 mg/dL — ABNORMAL HIGH (ref 70–99)
Potassium: 4.4 mmol/L (ref 3.5–5.1)
Sodium: 137 mmol/L (ref 135–145)

## 2021-10-24 LAB — MAGNESIUM: Magnesium: 2.2 mg/dL (ref 1.7–2.4)

## 2021-10-24 MED ORDER — SODIUM CHLORIDE 0.9 % IV BOLUS
500.0000 mL | Freq: Once | INTRAVENOUS | Status: AC
  Administered 2021-10-24: 500 mL via INTRAVENOUS

## 2021-10-24 NOTE — Progress Notes (Signed)
  Progress Note   Patient: [name] [name] SNK:[id number] DOB: [birth year] DOA: 10/15/2021     9 DOS: the patient was seen and examined on 10/24/2021 at 1:55PM      Brief hospital course: Mrs. Micheletti is a 81 y.o. F with persistent atrial fibrillation, hyperthyroidism, hypertension, and chronic combined CHF who prsented with weakness, tachycardia, and mild confusion.  In the ER, found to be in Afib with RVR.     Assessment and Plan: * Atrial fibrillation with RVR (HCC) - Continue sotalol and Coreg - Continue Xarelto  Acute metabolic encephalopathy Resolved  QT prolongation QT mostly less than .   - Check electrolytes - Check ECG in AM  Chronic combined systolic and diastolic heart failure (HCC) Appears euvolemic - Continue Coreg  Pressure injury of skin Coccyx, left, stage 1, present on admission  Subclinical hyperthyroidism    Anxiety - Continue Zyprexa  Hypertension BP labile, low this afternoon after hydrocodone - Hold losartan - Continue Coreg with hold parameters          Subjective: No complaints, no fever, no respiratory distress.     Physical Exam: Vitals:   10/24/21 0805 10/24/21 1141 10/24/21 1206 10/24/21 1329  BP: (!) 143/43 (!) 90/39 (!) 88/40 (!) 108/53  Pulse: (!) 59 (!) 59    Resp: (!) 21     Temp: 97.7 F (36.5 C) 97.7 F (36.5 C)  97.8 F (36.6 C)  TempSrc:  Oral  Oral  SpO2: 99% 98%    Weight:      Height:       Elderly female, lying in be, no acute distress, interactive, a little weak and tired RRR, no murmurs, no LE edema. Respiratory effort normal, lungs clear without rales Abdomen soft without tenderness, no guarding Attention normal, affect normal.    Data Reviewed: No new labs.  Tele reviewed.    Family Communication: Son by phone    Disposition: Status is: Inpatient Medically ready for discharge, pending SNF.           Author: Alberteen Sam, MD 10/24/2021 3:51 PM  For on call  review [email or web address].

## 2021-10-24 NOTE — Assessment & Plan Note (Signed)
See above

## 2021-10-24 NOTE — Assessment & Plan Note (Signed)
Appears euvolemic - Continue Coreg

## 2021-10-24 NOTE — Assessment & Plan Note (Signed)
-   Continue Zyprexa 

## 2021-10-24 NOTE — Assessment & Plan Note (Addendum)
BP normal - Hold losartan - Continue Coreg with hold parameters

## 2021-10-24 NOTE — Hospital Course (Addendum)
[name] [name] is a 81 y.o. F with persistent atrial fibrillation, hyperthyroidism, hypertension, and chronic combined CHF who prsented with weakness, tachycardia, and mild confusion.  In the ER, found to be in Afib with RVR.

## 2021-10-24 NOTE — Assessment & Plan Note (Addendum)
TSH borderline low but free T4 and T3 normal. - Repeat TSH in 3 weeks

## 2021-10-24 NOTE — Assessment & Plan Note (Signed)
QTc 500-549ms today.  Reviewed with Cardiology and RPh. - Reduce sotalol to 120 mg once daily - Check ECG in 3 days - Continue Coreg, Xarelto

## 2021-10-24 NOTE — Progress Notes (Addendum)
The patient is alert to self, time. No change from am assessment. HR 62, SR PVC, BP 88/40 manual. Pt reports feeling "weak". She denies CP, SOB, N/V or dizziness at this time. Will continue to monitor. Provider updated, see new orders.

## 2021-10-24 NOTE — Assessment & Plan Note (Signed)
Coccyx, left, stage 1, present on admission

## 2021-10-24 NOTE — Assessment & Plan Note (Signed)
Resolved

## 2021-10-25 DIAGNOSIS — I5042 Chronic combined systolic (congestive) and diastolic (congestive) heart failure: Secondary | ICD-10-CM | POA: Diagnosis not present

## 2021-10-25 DIAGNOSIS — G9341 Metabolic encephalopathy: Secondary | ICD-10-CM | POA: Diagnosis not present

## 2021-10-25 DIAGNOSIS — F419 Anxiety disorder, unspecified: Secondary | ICD-10-CM | POA: Diagnosis not present

## 2021-10-25 DIAGNOSIS — I4891 Unspecified atrial fibrillation: Secondary | ICD-10-CM | POA: Diagnosis not present

## 2021-10-25 MED ORDER — SOTALOL HCL 120 MG PO TABS
120.0000 mg | ORAL_TABLET | Freq: Every day | ORAL | Status: DC
  Administered 2021-10-25 – 2021-10-26 (×2): 120 mg via ORAL
  Filled 2021-10-25 (×2): qty 1

## 2021-10-25 NOTE — Progress Notes (Signed)
  Progress Note   Patient: [name] [name] IRS:[id number] DOB: Sep 06, 1940 DOA: 10/15/2021     10 DOS: the patient was seen and examined on 10/25/2021 at 8:11 AM      Brief hospital course: [name] [name] is a 81 y.o. F with persistent atrial fibrillation, hyperthyroidism, hypertension, and chronic combined CHF who prsented with weakness, tachycardia, and mild confusion.  In the ER, found to be in Afib with RVR.     Assessment and Plan: * Atrial fibrillation with RVR (HCC) QTc 500-513ms today.  Reviewed with Cardiology and RPh. - Reduce sotalol to 120 mg once daily - Check ECG in 3 days - Continue Coreg, Xarelto   Acute metabolic encephalopathy Resolved  QT prolongation See above  Chronic combined systolic and diastolic heart failure (HCC) Appears euvolemic - Continue Coreg  Pressure injury of skin Coccyx, left, stage 1, present on admission  Subclinical hyperthyroidism TSH borderline low but free T4 and T3 normal. - Repeat TSH in 3 weeks   Anxiety - Continue Zyprexa  Hypertension BP normal - Hold losartan - Continue Coreg with hold parameters          Subjective: Patient is anxious, but she has no chest pain, dyspnea, abdominal pain, diarrhea, fever, confusion.     Physical Exam: Vitals:   10/24/21 1845 10/24/21 2036 10/25/21 0640 10/25/21 1150  BP: (!) 104/46 (!) 116/55 (!) 146/70   Pulse: 64 67 82 73  Resp:  19 20   Temp:  98.3 F (36.8 C) 97.6 F (36.4 C) 98.3 F (36.8 C)  TempSrc:  Oral Axillary Oral  SpO2:  99%  98%  Weight:      Height:       Thin elderly female, lying in bed, appears uncomfortable and restless Heart rate regular, occasional premature beats, no murmurs appreciated, no peripheral edema Respiratory rate normal, lung sounds clear without rales or wheezes Attention normal, affect anxious, judgment insight appear moderately impaired, face symmetric, speech fluent, moves upper extremities with normal strength and  coordination   Data Reviewed: EKG personally reviewed Discussed with cardiology   Family Communication: Called to son by phone, no answer    Disposition: Status is: Inpatient Medically ready for discharge when bed available        Author: Alberteen Sam, MD 10/25/2021 12:12 PM  For on call review [email or web address].

## 2021-10-26 DIAGNOSIS — G9341 Metabolic encephalopathy: Secondary | ICD-10-CM | POA: Diagnosis not present

## 2021-10-26 DIAGNOSIS — I5042 Chronic combined systolic (congestive) and diastolic (congestive) heart failure: Secondary | ICD-10-CM | POA: Diagnosis not present

## 2021-10-26 DIAGNOSIS — I4891 Unspecified atrial fibrillation: Secondary | ICD-10-CM | POA: Diagnosis not present

## 2021-10-26 DIAGNOSIS — F419 Anxiety disorder, unspecified: Secondary | ICD-10-CM | POA: Diagnosis not present

## 2021-10-26 MED ORDER — ACETAMINOPHEN 325 MG PO TABS: 650.0000 mg | ORAL_TABLET | Freq: Four times a day (QID) | ORAL | Status: AC | PRN

## 2021-10-26 MED ORDER — ALPRAZOLAM 0.25 MG PO TABS: 0.2500 mg | ORAL_TABLET | Freq: Every evening | ORAL | 0 refills | Status: DC | PRN

## 2021-10-26 MED ORDER — CARVEDILOL 6.25 MG PO TABS: 6.2500 mg | ORAL_TABLET | Freq: Two times a day (BID) | ORAL | Status: DC

## 2021-10-26 MED ORDER — SOTALOL HCL 120 MG PO TABS: 120.0000 mg | ORAL_TABLET | Freq: Every day | ORAL | Status: DC

## 2021-10-26 MED ORDER — ENSURE ENLIVE PO LIQD: 237.0000 mL | Freq: Two times a day (BID) | ORAL | 12 refills | Status: AC

## 2021-10-26 NOTE — Progress Notes (Signed)
Nutrition Follow-up  DOCUMENTATION CODES:   Non-severe (moderate) malnutrition in context of chronic illness, Underweight  INTERVENTION:   -Ensure Plus High Protein po BID, each supplement provides 350 kcal and 20 grams of protein.    -Multivitamin with minerals daily  NUTRITION DIAGNOSIS:   Moderate Malnutrition related to chronic illness as evidenced by moderate fat depletion, severe muscle depletion.  Ongoing.  GOAL:   Patient will meet greater than or equal to 90% of their needs  Progressing.  MONITOR:   PO intake, Supplement acceptance, Labs, Weight trends, I & O's, Skin  ASSESSMENT:   81 y.o. female with medical history significant of afib, hyperthyroidism, HTN.  Patient consuming 0-50% of meals at this time. Alert/oriented x 2. Accepting Ensure supplements at this time. Per MD note, pt is medically stable for discharge to SNF. Awaiting bed.  Admission weight: 94 lbs. No new weights since admission.  Medications: Multivitamin with minerals daily  Labs reviewed.  Diet Order:   Diet Order             Diet Heart Room service appropriate? Yes; Fluid consistency: Thin  Diet effective now                   EDUCATION NEEDS:   No education needs have been identified at this time  Skin:  Skin Assessment: Skin Integrity Issues: Skin Integrity Issues:: Stage I Stage I: coccyx  Last BM:  7/2 -type 5  Height:   Ht Readings from Last 1 Encounters:  10/19/21 5\' 6"  (1.676 m)    Weight:   Wt Readings from Last 1 Encounters:  10/19/21 43 kg    BMI:  Body mass index is 15.3 kg/m.  Estimated Nutritional Needs:   Kcal:  1550-1750  Protein:  75-85g  Fluid:  1.7L/day   10/21/21, MS, RD, LDN Inpatient Clinical Dietitian Contact information available via Amion

## 2021-10-26 NOTE — Discharge Summary (Signed)
Physician Discharge Summary   Patient: [name] [name] MRN: [id number] DOB: March 06, 1941  Admit date:     10/15/2021  Discharge date: 10/26/21  Discharge Physician: Edwin Dada   PCP: Burnard Bunting, MD     Recommendations at discharge:  Follow up with Cardiology Christen Bame in 3 days Christen Bame: Please check ECG and QTc in 3 days on new lower dose Sotalol (from 80 BID to 120 daily) Follow up with PCP Dr. Reynaldo Minium one week after SNF discharge Dr. Reynaldo Minium: Please check TSH in 3 months       Discharge Diagnoses: Principal Problem:   Atrial fibrillation with RVR ([place]) Active Problems:   Acute metabolic encephalopathy   Hypertension   Anxiety   Subclinical hyperthyroidism   Pressure injury of skin   Chronic combined systolic and diastolic heart failure (HCC)   QT prolongation        Hospital Course: [name] is a 81 y.o. F with persistent atrial fibrillation, hyperthyroidism, hypertension, and chronic combined CHF who prsented with weakness, tachycardia, and mild confusion.  In the ER, found to be in Afib with RVR.     * Atrial fibrillation with RVR [facility]) Admitted and Cardiology were consulted.  She underwent DCCV on 6/26, sotalol was continued and metoprolol was added.    Her QTc increased to 500-570ms prior to discharge, in setting of CrCl 30s, and so her sotalol was reduced to once daily dosing in discussion with Dr. Caryl Comes.    Close Cardiology follow up.     Chronic combined systolic and diastolic heart failure (HCC) Nonischemic cardiomyopathy Appeared euvolemic.  Not on diuretics, did not need them here.  Pressure injury of skin Coccyx, left, stage 1, present on admission  Subclinical hyperthyroidism TSH borderline low but free T4 and T3 normal. - Repeat TSH in 3 weeks            The [place] was reviewed for this patient prior to discharge.   Consultants: Cardiology Procedures  performed: DCCV  Disposition: Skilled nursing facility Diet recommendation:  Discharge Diet Orders (From admission, onward)     Start     Ordered   10/26/21 0000  Diet - low sodium heart healthy        10/26/21 1359             DISCHARGE MEDICATION: Allergies as of 10/26/2021       Reactions   Amiodarone Other (See Comments)   Hyperthyroidism   Diltiazem Other (See Comments)   Put into hospital for 3 days   Macrobid [nitrofurantoin Monohyd Macro] Nausea Only   Mexiletine Other (See Comments)   Confusion, pain in the face   Aspirin Rash   Tolerates low dose aspirin   Naproxen Sodium Swelling, Rash   Sulfa Drugs Cross Reactors Other (See Comments)   Allergy per mother - unknown reaction        Medication List     STOP taking these medications    amoxicillin-clavulanate 500-125 MG tablet Commonly known as: AUGMENTIN   losartan 25 MG tablet Commonly known as: COZAAR   traMADol 50 MG tablet Commonly known as: ULTRAM       TAKE these medications    acetaminophen 325 MG tablet Commonly known as: TYLENOL Take 2 tablets (650 mg total) by mouth every 6 (six) hours as needed for mild pain (or Fever >/= 101).   ALPRAZolam 0.25 MG tablet Commonly known as: XANAX Take 1 tablet (0.25 mg total) by mouth  at bedtime as needed. What changed:  how much to take when to take this reasons to take this   carvedilol 6.25 MG tablet Commonly known as: COREG Take 1 tablet (6.25 mg total) by mouth 2 (two) times daily with a meal.   feeding supplement Liqd Take 237 mLs by mouth 2 (two) times daily between meals.   multivitamin with minerals Tabs tablet Take 1 tablet by mouth daily.   polyethylene glycol 17 g packet Commonly known as: MiraLax Take 17 g by mouth daily. What changed:  when to take this reasons to take this   Rivaroxaban 15 MG Tabs tablet Commonly known as: Xarelto Take 1 tablet (15 mg total) by mouth daily with supper. Pt. Needs to make an appt  .with Dr. Curt Bears in order to receive future refills. Thank You. 2nd Attempt.   sotalol 120 MG tablet Commonly known as: BETAPACE Take 1 tablet (120 mg total) by mouth daily. Start taking on: October 27, 2021 What changed:  medication strength See the new instructions.        Follow-up Information     Swinyer, Lanice Schwab, NP Follow up on 10/29/2021.   Specialty: Nurse Practitioner Why: 10:05 am for hospital follow up Contact information: [place] [address] [phone]                 Discharge Instructions     Diet - low sodium heart healthy   Complete by: As directed    Increase activity slowly   Complete by: As directed    No wound care   Complete by: As directed        Discharge Exam: Filed Weights   10/15/21 0845 10/19/21 1300  Weight: 43 kg 43 kg    General: Pt is alert, awake, not in acute distress, sitting up in bed, interactive Cardiovascular: RRR, occasional premature beats, nl S1-S2, no murmurs appreciated.   No LE edema.   Respiratory: Normal respiratory rate and rhythm.  CTAB without rales or wheezes. Abdominal: Abdomen soft and non-tender.  No distension or HSM.   Neuro/Psych: Strength symmetric in upper and lower extremities.  Judgment and insight appear slightly impaired.   Condition at discharge: fair  The results of significant diagnostics from this hospitalization (including imaging, microbiology, ancillary and laboratory) are listed below for reference.   Imaging Studies: ECHO TEE  Result Date: 10/19/2021    TRANSESOPHOGEAL ECHO REPORT   Patient Name:   [name] [name] Date of Exam: 10/19/2021 Medical Rec #:  [id number]         Height:       66.0 in Accession #:    [id number]        Weight:       94.8 lb Date of Birth:  11-22-1940         BSA:          1.456 m Patient Age:    71 years          BP:           136/89 mmHg Patient Gender: F                 HR:           119 bpm. Exam Location:  Outpatient Procedure:  Transesophageal Echo, Intracardiac Opacification Agent, Color Doppler            and Cardiac Doppler Indications:     atrial fibrillation  History:  Patient has prior history of Echocardiogram examinations, most                  recent 10/16/2021. Arrythmias:bigeminy; Risk                  Factors:Hypertension and Dyslipidemia.                   Mitral Valve: valve is present in the mitral position.  Sonographer:     Johny Chess RDCS Referring Phys:  Williams Diagnosing Phys: Kirk Ruths MD PROCEDURE: After discussion of the risks and benefits of a TEE, an informed consent was obtained from the patient. The transesophogeal probe was passed without difficulty through the esophogus of the patient. Imaged were obtained with the patient in a left lateral decubitus position. Sedation performed by different physician. The patient was monitored while under deep sedation. Anesthestetic sedation was provided intravenously by Anesthesiology: 200mg  of Propofol, 40mg  of Lidocaine. The patient developed no complications during the procedure. A direct current cardioversion was performed. IMPRESSIONS  1. Definity used to assess left atrial appendage; no thrombus noted.  2. Left ventricular ejection fraction, by estimation, is 40 to 45%. The left ventricle has mildly decreased function. The left ventricle demonstrates global hypokinesis.  3. Right ventricular systolic function is normal. The right ventricular size is normal.  4. Left atrial size was mildly dilated. No left atrial/left atrial appendage thrombus was detected.  5. The mitral valve has been repaired/replaced. Mild mitral valve regurgitation. No evidence of mitral stenosis. There is a present in the mitral position.  6. The aortic valve is tricuspid. Aortic valve regurgitation is mild. Aortic valve sclerosis is present, with no evidence of aortic valve stenosis. FINDINGS  Left Ventricle: Left ventricular ejection fraction, by estimation, is  40 to 45%. The left ventricle has mildly decreased function. The left ventricle demonstrates global hypokinesis. Definity contrast agent was given IV to delineate the left ventricular  endocardial borders. The left ventricular internal cavity size was normal in size. Right Ventricle: The right ventricular size is normal. Right vetricular wall thickness was not assessed. Right ventricular systolic function is normal. Left Atrium: Left atrial size was mildly dilated. No left atrial/left atrial appendage thrombus was detected. Right Atrium: Right atrial size was normal in size. Pericardium: There is no evidence of pericardial effusion. Mitral Valve: The mitral valve has been repaired/replaced. Mild mitral valve regurgitation. There is a present in the mitral position. No evidence of mitral valve stenosis. MV peak gradient, 8.3 mmHg. The mean mitral valve gradient is 4.3 mmHg. Tricuspid Valve: The tricuspid valve is normal in structure. Tricuspid valve regurgitation is mild. Aortic Valve: The aortic valve is tricuspid. Aortic valve regurgitation is mild. Aortic valve sclerosis is present, with no evidence of aortic valve stenosis. Pulmonic Valve: The pulmonic valve was normal in structure. Pulmonic valve regurgitation is trivial. Aorta: The aortic root is normal in size and structure. There is minimal (Grade I) plaque involving the descending aorta. IAS/Shunts: No atrial level shunt detected by color flow Doppler. Additional Comments: Definity used to assess left atrial appendage; no thrombus noted.  MITRAL VALVE MV Peak grad: 8.3 mmHg MV Mean grad: 4.3 mmHg MV Vmax:      1.44 m/s MV Vmean:     94.6 cm/s Kirk Ruths MD Electronically signed by Kirk Ruths MD Signature Date/Time: 10/19/2021/3:38:42 PM    Final    ECHOCARDIOGRAM COMPLETE  Result Date: 10/16/2021    ECHOCARDIOGRAM REPORT  Patient Name:   CHRISY HILLEBRAND Date of Exam: 10/16/2021 Medical Rec #:  [id number]         Height:       66.0 in Accession #:     [id number]        Weight:       94.8 lb Date of Birth:  03-Oct-1940         BSA:          1.456 m Patient Age:    80 years          BP:           149/86 mmHg Patient Gender: F                 HR:           135 bpm. Exam Location:  Inpatient Procedure: 2D Echo, Cardiac Doppler and Color Doppler Indications:    AFIB  History:        Patient has prior history of Echocardiogram examinations, most                 recent 04/21/2019. Arrythmias:Atrial Fibrillation; Risk                 Factors:Hypertension.  Sonographer:    Rodrigo Ran RCS Referring Phys: [id number] Teddy Spike  Sonographer Comments: Technically challenging study due to limited acoustic windows. Image acquisition challenging due to patient body habitus and Image acquisition challenging due to uncooperative patient. IMPRESSIONS  1. Left ventricular ejection fraction, by estimation, is 40 to 45%. The left ventricle has mildly decreased function. The left ventricle demonstrates regional wall motion abnormalities (see scoring diagram/findings for description). Left ventricular diastolic function could not be evaluated. There is severe hypokinesis of the left ventricular, entire anteroseptal wall.  2. Right ventricular systolic function is normal. The right ventricular size is small.  3. Left atrial size was mildly dilated.  4. The mitral valve is grossly normal. Mild mitral valve regurgitation.  5. Calcification of the non coronary cusp. . The aortic valve is calcified. Aortic valve regurgitation is mild.  6. The inferior vena cava is normal in size with greater than 50% respiratory variability, suggesting right atrial pressure of 3 mmHg. FINDINGS  Left Ventricle: Left ventricular ejection fraction, by estimation, is 40 to 45%. The left ventricle has mildly decreased function. The left ventricle demonstrates regional wall motion abnormalities. Severe hypokinesis of the left ventricular, entire anteroseptal wall. The left ventricular internal cavity size  was normal in size. There is no left ventricular hypertrophy. Left ventricular diastolic function could not be evaluated due to atrial fibrillation. Left ventricular diastolic function could not be evaluated. Right Ventricle: The right ventricular size is small. Right vetricular wall thickness was not well visualized. Right ventricular systolic function is normal. Left Atrium: Left atrial size was mildly dilated. Right Atrium: Right atrial size was normal in size. Pericardium: There is no evidence of pericardial effusion. Mitral Valve: The mitral valve is grossly normal. Mild mitral valve regurgitation. Tricuspid Valve: The tricuspid valve is normal in structure. Tricuspid valve regurgitation is trivial. Aortic Valve: Calcification of the non coronary cusp. The aortic valve is calcified. Aortic valve regurgitation is mild. Pulmonic Valve: The pulmonic valve was normal in structure. Pulmonic valve regurgitation is trivial. Aorta: The aortic root and ascending aorta are structurally normal, with no evidence of dilitation. Venous: The inferior vena cava is normal in size with greater than 50% respiratory variability, suggesting right atrial pressure of 3 mmHg.  IAS/Shunts: The interatrial septum was not well visualized.  LEFT VENTRICLE PLAX 2D LVIDd:         4.20 cm   Diastology LVIDs:         3.90 cm   LV e' lateral: 7.07 cm/s LV PW:         0.90 cm LV IVS:        1.00 cm LVOT diam:     1.90 cm LVOT Area:     2.84 cm  IVC IVC diam: 1.80 cm LEFT ATRIUM             Index        RIGHT ATRIUM           Index LA diam:        5.30 cm 3.64 cm/m   RA Area:     15.90 cm LA Vol (A2C):   33.9 ml 23.28 ml/m  RA Volume:   40.00 ml  27.47 ml/m LA Vol (A4C):   60.3 ml 41.41 ml/m LA Biplane Vol: 49.0 ml 33.65 ml/m                        PULMONIC VALVE AORTA                 PV Vmax:          0.68 m/s Ao Root diam: 3.20 cm PV Peak grad:     1.8 mmHg Ao Asc diam:  3.40 cm PR End Diast Vel: 13.10 msec  TRICUSPID VALVE TR Peak grad:    27.5 mmHg TR Vmax:        262.00 cm/s  SHUNTS Systemic Diam: 1.90 cm Kristeen Miss MD Electronically signed by Kristeen Miss MD Signature Date/Time: 10/16/2021/11:45:10 AM    Final    CT ABDOMEN PELVIS W CONTRAST  Result Date: 10/15/2021 CLINICAL DATA:  Pyelonephritis, uncomplicated. EXAM: CT ABDOMEN AND PELVIS WITH CONTRAST TECHNIQUE: Multidetector CT imaging of the abdomen and pelvis was performed using the standard protocol following bolus administration of intravenous contrast. RADIATION DOSE REDUCTION: This exam was performed according to the departmental dose-optimization program which includes automated exposure control, adjustment of the mA and/or kV according to patient size and/or use of iterative reconstruction technique. CONTRAST:  OMNIPAQUE IOHEXOL 300 MG/ML  SOLN COMPARISON:  CT June 23, 2019 FINDINGS: Lower chest: Bibasilar atelectasis versus scarring. Aortic atherosclerosis. Hepatobiliary: No suspicious hepatic lesion. Gallbladder is unremarkable. No biliary ductal dilation. Pancreas: No pancreatic ductal dilation or evidence of acute inflammation. Spleen: No splenomegaly or focal splenic lesion. Adrenals/Urinary Tract: Bilateral adrenal glands appear normal. No hydronephrosis. Kidneys demonstrate symmetric enhancement and excretion of contrast material. Hypodense 11 mm left lower pole renal cyst is considered benign and requires no independent imaging follow-up. Urinary bladder is unremarkable for degree of distension. Stomach/Bowel: No radiopaque enteric contrast material was administered. Stomach is nondistended limiting evaluation. No pathologic dilation of small or large bowel. The appendix is not confidently identified however there is no pericecal inflammation. Terminal ileum appears normal. No evidence of acute bowel inflammation. Colonic diverticulosis without findings of acute diverticulitis. Vascular/Lymphatic: Aortic and branch vessel atherosclerosis without abdominal  aortic aneurysm. Prominence and early filling of the bilateral gonadal veins and pelvic collateral vessels. No pathologically enlarged abdominal or pelvic lymph nodes. Reproductive: Uterus and bilateral adnexa are unremarkable. Other: No significant abdominopelvic free fluid. Musculoskeletal: Similar remote superior compression deformity of the L3 vertebral body. Diffuse demineralization of bone. No acute osseous abnormality. IMPRESSION: 1.  No acute abnormality in the abdomen or pelvis, specifically no evidence of obstructive uropathy. 2. Colonic diverticulosis without findings of acute diverticulitis. 3. Prominence and early filling of the bilateral gonadal veins and pelvic collateral vessels as can be seen with pelvic congestion syndrome. 4.  Aortic Atherosclerosis (ICD10-I70.0). Electronically Signed   By: Dahlia Bailiff M.D.   On: 10/15/2021 11:30   DG Chest Port 1 View  Result Date: 10/15/2021 CLINICAL DATA:  Weakness. EXAM: PORTABLE CHEST 1 VIEW COMPARISON:  06/23/2019 FINDINGS: 0917 hours. Rotated film. Lungs are hyperexpanded. The lungs are clear without focal pneumonia, edema, pneumothorax or pleural effusion. The cardio pericardial silhouette is enlarged. Bones are diffusely demineralized. Telemetry leads overlie the chest. IMPRESSION: Hyperexpansion without acute cardiopulmonary findings. Electronically Signed   By: Misty Stanley M.D.   On: 10/15/2021 09:28    Microbiology: Results for orders placed or performed during the hospital encounter of 10/15/21  Urine Culture     Status: None   Collection Time: 10/15/21  8:57 AM   Specimen: In/Out Cath Urine  Result Value Ref Range Status   Specimen Description   Final    IN/OUT CATH URINE Performed at [facility], [place] [address], [place], [place] [address]    Special Requests   Final    NONE Performed at [facility], [employer] [address] [place], [place] [address]    Culture   Final    NO GROWTH Performed  at [facility], [place] [address] [place], [place] [address]    Report Status 10/16/2021 FINAL  Final  Blood Culture (routine x 2)     Status: None   Collection Time: 10/15/21  9:10 AM   Specimen: Right Antecubital; Blood  Result Value Ref Range Status   Specimen Description   Final    RIGHT ANTECUBITAL BLOOD Performed at [facility] [address] [place], [place] [address]    Special Requests   Final    Blood Culture adequate volume BOTTLES DRAWN AEROBIC AND ANAEROBIC Performed at [facility] [address], [place], [place] [address]    Culture   Final    NO GROWTH 5 DAYS Performed at [facility], [place] [address] [place], [place] [address]    Report Status 10/20/2021 FINAL  Final  Blood Culture (routine x 2)     Status: None   Collection Time: 10/15/21  9:15 AM   Specimen: BLOOD LEFT WRIST  Result Value Ref Range Status   Specimen Description   Final    BLOOD LEFT WRIST BLOOD Performed at [facility] [address], [place], [place] [address]    Special Requests   Final    Blood Culture adequate volume BOTTLES DRAWN AEROBIC ONLY Performed at [facility] [address] [place], [place] [address]    Culture   Final    NO GROWTH 5 DAYS Performed at [facility], [place] [address] [place], [place] [address]    Report Status 10/20/2021 FINAL  Final    Labs: CBC: No results for input(s): "WBC", "NEUTROABS", "HGB", "HCT", "MCV", "PLT" in the last 168 hours. Basic Metabolic Panel: Recent Labs  Lab 10/21/21 0752 10/24/21 1533  NA 141 137  K 3.7 4.4  CL 106 104  CO2 27 28  GLUCOSE 103* 102*  BUN 12 26*  CREATININE 0.69 0.84  CALCIUM 10.0 9.4  MG  --  2.2   Liver Function Tests:  No results for input(s): "AST", "ALT", "ALKPHOS", "BILITOT", "PROT", "ALBUMIN" in the last 168 hours. CBG: No results for input(s): "GLUCAP" in the last 168 hours.  Discharge time spent:  approximately 35 minutes spent on discharge counseling, evaluation of patient on day of discharge, and coordination of discharge planning with nursing, social work, pharmacy and case management  Signed: Edwin Dada, MD [facility] 10/26/2021

## 2021-10-26 NOTE — Progress Notes (Signed)
Physical Therapy Treatment Patient Details Name: [name] [name] MRN: [id number] DOB: [birth year] Today's Date: 10/26/2021   History of Present Illness Patient is a 81 year old female who was admitted with A fib with RVR, acute metabolic encephalopathy,and subclinical hypothyroidism. PMH: a fib, severe MR s/p MV repair, systolic CHF, anxiety disorder, hyperlipidemia,    PT Comments    Patient is progressing well with acute PT and ambulated short distance to bathroom and back to chair with Min assist and RW to steady. Pt required Mod assist for UB dressing and Max assist for LB dressing. EOS encouraged pt to sit up with family. Recommend SNF for ST rehab, will continue to progress pt as able.    Recommendations for follow up therapy are one component of a multi-disciplinary discharge planning process, led by the attending physician.  Recommendations may be updated based on patient status, additional functional criteria and insurance authorization.  Follow Up Recommendations  Skilled nursing-short term rehab (<3 hours/day) Can patient physically be transported by private vehicle: Yes   Assistance Recommended at Discharge Frequent or constant Supervision/Assistance  Patient can return home with the following A little help with walking and/or transfers;A little help with bathing/dressing/bathroom;Assistance with cooking/housework;Assistance with feeding;Direct supervision/assist for medications management;Direct supervision/assist for financial management;Assist for transportation;Help with stairs or ramp for entrance   Equipment Recommendations  None recommended by PT    Recommendations for Other Services       Precautions / Restrictions Precautions Precautions: Fall Restrictions Weight Bearing Restrictions: No     Mobility  Bed Mobility Overal bed mobility: Needs Assistance Bed Mobility: Supine to Sit     Supine to sit: Min assist, HOB elevated     General bed mobility  comments: Pt required VC's for hand placement on handrail/therapist hand for scooting to EOB. Pt relied on physical assist for trunk to EOB with feet on floor.    Transfers Overall transfer level: Needs assistance Equipment used: Rolling walker (2 wheels) Transfers: Sit to/from Stand Sit to Stand: Min assist   Step pivot transfers: Min assist       General transfer comment: Pt required VC's for stand up and for hand placement on RW once in standing. pt completed from EOB and toilet.    Ambulation/Gait Ambulation/Gait assistance: Min assist Gait Distance (Feet): 20 Feet (2x10) Assistive device: Rolling walker (2 wheels) Gait Pattern/deviations: Step-through pattern, Decreased step length - right, Decreased step length - left, Decreased stride length, Shuffle, Trunk flexed, Narrow base of support Gait velocity: decr     General Gait Details: cues for hand placement on RW and assist to steady balance and maintain safe position. pt amb to bathroom and back.   Stairs             Wheelchair Mobility    Modified Rankin (Stroke Patients Only)       Balance Overall balance assessment: Needs assistance Sitting-balance support: Feet supported, Single extremity supported Sitting balance-Leahy Scale: Fair Sitting balance - Comments: pt able to complete pericare after voiding BB   Standing balance support: Bilateral upper extremity supported, During functional activity, Reliant on assistive device for balance Standing balance-Leahy Scale: Fair Standing balance comment: Pt relied on Bil UE support from RW for standing, no dizziness.                            Cognition Arousal/Alertness: Awake/alert Behavior During Therapy: WFL for tasks assessed/performed Overall Cognitive Status: Difficult to assess  General Comments:  (Pt able to answer first/last name and date correctly, however struggled with sequencing of  events, easily distracted from task instructed.)        Exercises      General Comments        Pertinent Vitals/Pain Pain Assessment Pain Assessment: No/denies pain    Home Living                          Prior Function            PT Goals (current goals can now be found in the care plan section) Acute Rehab PT Goals Patient Stated Goal: To go home PT Goal Formulation: With patient Time For Goal Achievement: 11/03/21 Potential to Achieve Goals: Good Progress towards PT goals: Progressing toward goals    Frequency    Min 3X/week      PT Plan Current plan remains appropriate    Co-evaluation              AM-PAC PT "6 Clicks" Mobility   Outcome Measure  Help needed turning from your back to your side while in a flat bed without using bedrails?: A Little Help needed moving from lying on your back to sitting on the side of a flat bed without using bedrails?: A Little Help needed moving to and from a bed to a chair (including a wheelchair)?: A Little Help needed standing up from a chair using your arms (e.g., wheelchair or bedside chair)?: A Little Help needed to walk in hospital room?: A Little Help needed climbing 3-5 steps with a railing? : A Lot 6 Click Score: 17    End of Session Equipment Utilized During Treatment: Gait belt Activity Tolerance: Patient tolerated treatment well Patient left: in chair;with call bell/phone within reach;with chair alarm set Nurse Communication: Mobility status PT Visit Diagnosis: Unsteadiness on feet (R26.81);Other abnormalities of gait and mobility (R26.89);Pain;Muscle weakness (generalized) (M62.81)     Time: 8850-2774 PT Time Calculation (min) (ACUTE ONLY): 23 min  Charges:  $Gait Training: 8-22 mins $Therapeutic Activity: 8-22 mins                     Wynn Maudlin, DPT Acute Rehabilitation Services Office [phone] Pager [phone]  10/26/21 4:18 PM

## 2021-10-26 NOTE — TOC Transition Note (Signed)
Transition of Care [facility]) - CM/SW Discharge Note   Patient Details  Name: [name] [name] MRN: [id number] Date of Birth: [birth year]  [facility]) CM/SW Contact:  Armanda Heritage, RN Phone Number: 10/26/2021, 2:38 PM   Clinical Narrative:    Patient has a bed today at [facility] today.  Patient will discharge to room 116, report to be called to [phone].  [facility] transport arranged, DC summary faxed to snf.     Final next level of care: Skilled Nursing Facility Barriers to Discharge: No Barriers Identified   Patient Goals and CMS Choice Patient states their goals for this hospitalization and ongoing recovery are:: Rehab CMS Medicare.gov Compare Post Acute Care list provided to:: Patient Represenative (must comment) Marylene Land dtr) Choice offered to / list presented to : Adult Children  Discharge Placement                       Discharge Plan and Services   Discharge Planning Services: CM Consult Post Acute Care Choice: Skilled Nursing Facility                               Social Determinants of Health (SDOH) Interventions     Readmission Risk Interventions     No data to display

## 2021-10-26 NOTE — Care Management Important Message (Signed)
Important Message  Patient Details IM Letter placed in Patients room. Name: [name] [name] MRN: [id number] Date of Birth: 06-Jan-1941   Medicare Important Message Given:  Yes     [name] 10/26/2021, 2:29 PM

## 2021-10-27 DIAGNOSIS — R5383 Other fatigue: Secondary | ICD-10-CM | POA: Diagnosis not present

## 2021-10-27 DIAGNOSIS — R0789 Other chest pain: Secondary | ICD-10-CM | POA: Diagnosis not present

## 2021-10-27 DIAGNOSIS — R079 Chest pain, unspecified: Secondary | ICD-10-CM | POA: Diagnosis not present

## 2021-10-27 DIAGNOSIS — E876 Hypokalemia: Secondary | ICD-10-CM | POA: Diagnosis not present

## 2021-10-27 DIAGNOSIS — I4892 Unspecified atrial flutter: Secondary | ICD-10-CM | POA: Diagnosis not present

## 2021-10-27 DIAGNOSIS — S31809A Unspecified open wound of unspecified buttock, initial encounter: Secondary | ICD-10-CM | POA: Diagnosis not present

## 2021-10-27 DIAGNOSIS — K5792 Diverticulitis of intestine, part unspecified, without perforation or abscess without bleeding: Secondary | ICD-10-CM | POA: Diagnosis not present

## 2021-10-27 DIAGNOSIS — D649 Anemia, unspecified: Secondary | ICD-10-CM | POA: Diagnosis not present

## 2021-10-27 DIAGNOSIS — R339 Retention of urine, unspecified: Secondary | ICD-10-CM | POA: Diagnosis not present

## 2021-10-27 DIAGNOSIS — E049 Nontoxic goiter, unspecified: Secondary | ICD-10-CM | POA: Diagnosis not present

## 2021-10-27 DIAGNOSIS — F411 Generalized anxiety disorder: Secondary | ICD-10-CM | POA: Diagnosis not present

## 2021-10-27 DIAGNOSIS — I4819 Other persistent atrial fibrillation: Secondary | ICD-10-CM | POA: Diagnosis not present

## 2021-10-27 DIAGNOSIS — E0590 Thyrotoxicosis, unspecified without thyrotoxic crisis or storm: Secondary | ICD-10-CM | POA: Diagnosis not present

## 2021-10-27 DIAGNOSIS — M6281 Muscle weakness (generalized): Secondary | ICD-10-CM | POA: Diagnosis not present

## 2021-10-27 DIAGNOSIS — R0989 Other specified symptoms and signs involving the circulatory and respiratory systems: Secondary | ICD-10-CM | POA: Diagnosis not present

## 2021-10-27 DIAGNOSIS — I499 Cardiac arrhythmia, unspecified: Secondary | ICD-10-CM | POA: Diagnosis not present

## 2021-10-27 DIAGNOSIS — R300 Dysuria: Secondary | ICD-10-CM | POA: Diagnosis not present

## 2021-10-27 DIAGNOSIS — I5032 Chronic diastolic (congestive) heart failure: Secondary | ICD-10-CM | POA: Diagnosis not present

## 2021-10-27 DIAGNOSIS — I350 Nonrheumatic aortic (valve) stenosis: Secondary | ICD-10-CM | POA: Diagnosis not present

## 2021-10-27 DIAGNOSIS — R001 Bradycardia, unspecified: Secondary | ICD-10-CM | POA: Diagnosis not present

## 2021-10-27 DIAGNOSIS — I428 Other cardiomyopathies: Secondary | ICD-10-CM | POA: Diagnosis not present

## 2021-10-27 DIAGNOSIS — R410 Disorientation, unspecified: Secondary | ICD-10-CM | POA: Diagnosis not present

## 2021-10-27 DIAGNOSIS — E440 Moderate protein-calorie malnutrition: Secondary | ICD-10-CM | POA: Diagnosis not present

## 2021-10-27 DIAGNOSIS — N390 Urinary tract infection, site not specified: Secondary | ICD-10-CM | POA: Diagnosis not present

## 2021-10-27 DIAGNOSIS — G453 Amaurosis fugax: Secondary | ICD-10-CM | POA: Diagnosis not present

## 2021-10-27 DIAGNOSIS — G9341 Metabolic encephalopathy: Secondary | ICD-10-CM | POA: Diagnosis not present

## 2021-10-27 DIAGNOSIS — I493 Ventricular premature depolarization: Secondary | ICD-10-CM | POA: Diagnosis not present

## 2021-10-27 DIAGNOSIS — K219 Gastro-esophageal reflux disease without esophagitis: Secondary | ICD-10-CM | POA: Diagnosis not present

## 2021-10-27 DIAGNOSIS — I4891 Unspecified atrial fibrillation: Secondary | ICD-10-CM | POA: Diagnosis not present

## 2021-10-27 DIAGNOSIS — I5042 Chronic combined systolic (congestive) and diastolic (congestive) heart failure: Secondary | ICD-10-CM | POA: Diagnosis not present

## 2021-10-27 DIAGNOSIS — Z743 Need for continuous supervision: Secondary | ICD-10-CM | POA: Diagnosis not present

## 2021-10-27 DIAGNOSIS — R451 Restlessness and agitation: Secondary | ICD-10-CM | POA: Diagnosis not present

## 2021-10-27 DIAGNOSIS — R3129 Other microscopic hematuria: Secondary | ICD-10-CM | POA: Diagnosis not present

## 2021-10-27 DIAGNOSIS — I480 Paroxysmal atrial fibrillation: Secondary | ICD-10-CM | POA: Diagnosis not present

## 2021-10-27 DIAGNOSIS — E785 Hyperlipidemia, unspecified: Secondary | ICD-10-CM | POA: Diagnosis not present

## 2021-10-27 DIAGNOSIS — I2510 Atherosclerotic heart disease of native coronary artery without angina pectoris: Secondary | ICD-10-CM | POA: Diagnosis not present

## 2021-10-27 DIAGNOSIS — I498 Other specified cardiac arrhythmias: Secondary | ICD-10-CM | POA: Diagnosis not present

## 2021-10-27 DIAGNOSIS — G9340 Encephalopathy, unspecified: Secondary | ICD-10-CM | POA: Diagnosis not present

## 2021-10-27 DIAGNOSIS — R9431 Abnormal electrocardiogram [ECG] [EKG]: Secondary | ICD-10-CM | POA: Diagnosis not present

## 2021-10-27 DIAGNOSIS — F419 Anxiety disorder, unspecified: Secondary | ICD-10-CM | POA: Diagnosis not present

## 2021-10-27 DIAGNOSIS — D689 Coagulation defect, unspecified: Secondary | ICD-10-CM | POA: Diagnosis not present

## 2021-10-27 DIAGNOSIS — I10 Essential (primary) hypertension: Secondary | ICD-10-CM | POA: Diagnosis not present

## 2021-10-27 NOTE — Progress Notes (Signed)
Patient discharged to [facility] healthcare, transported  to facility via ambulance. Discharge packet for facility given to EMS driver. Patient attempted to call family but no one answered. Patient alert, no distress noted. No open wound or pressure injury noted.

## 2021-10-27 NOTE — Progress Notes (Deleted)
Cardiology Office Note:    Date:  10/27/2021   ID:  [name] [name], DOB 07/23/1940, MRN [id number]  PCP:  Geoffry Paradise, MD   [facility] Providers Cardiologist:  Dietrich Pates, MD Electrophysiologist:  Regan Lemming, MD { Click to update primary MD,subspecialty MD or APP then REFRESH:1}    Referring MD: Geoffry Paradise, MD   Chief Complaint: ***  History of Present Illness:    [name] [name] is a *** 81 y.o. female with a hx of hyperlipidemia, hypertension, mitral valve repair, PAF on chronic anticoagulation s/p left atrial appendage clipping with maze in 2014, amiodarone induced thyrotoxicosis, normal cardiac catheterization 2017, chronic HFrEF, frequent PVCs  Seen by Dr. Elberta Fortis in March 2020 for PVCs. The PVCs were of multipile morphology, felt to be high risk for ablation.She was switched from carvedilol to sotalol. Her last visit with [facility] provider was 09/17/19 with Jorja Loa, PA in [facility] on 09/17/19. She was advised to follow-up with Dr. Elberta Fortis in 1 month.  Admission 6/22-10/26/21 for encephalopathy, cardiology was asked to see. Was not very conversant, come history provided by daughter. Had been advised to stop Xarelto due to hematuria. On carvedilol for frequent PVCs (metoprolol cannot be crushed). Noted that her LA appendage was not ligated despite the fact that it was sutured at the time of her MAZE and valve repair in 2014. Echo revealed LVEF 40-45%, global HK of LV, mildly dilated LA, stable repaired mitral valve, mild MR, mild AI. Continued to have uncontrolled a fib with recommendation to follow-up with cardiology.   Today, she is here    Past Medical History:  Diagnosis Date   Acute coronary syndrome (HCC) 06/2015   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 Dx 04/2012;  b. Amio d/c'd 2/2 hyperthyroidism;  c. Atrial flutter 10/2012;  d.  On xarelto.   Bigeminy    CHF (congestive heart failure) (HCC)    Complication of anesthesia    Coronary artery disease    Diverticulitis    GERD (gastroesophageal reflux disease)    Goiter    Headache    Heart murmur    Hyperlipidemia    recently- taken off Crestor- due to pain in her legs, but this was after cardiac cath. , pt. questioning whether the pain in her legs was related to cath. or crestor   Hypertension    Then hypotension 04/2012 limiting med adjustment.   Intractable vomiting    Malnutrition of moderate degree (HCC)    Microscopic hematuria 08/20/2012   Mitral valve prolapse    a. H/o MVP, with severe MR 04/2012, had MV repair and Maze   Pneumonia    treated /w antibiotic- [facility]- early Feb. 2014   PONV (postoperative nausea and vomiting)    Refusal of blood transfusions as patient is Jehovah's Witness    S/P Maze operation for atrial fibrillation 06/28/2012   Complete bilateral atrial lesion set using cryothermy ablation with oversewing of LA appendage via right minithoracotomy   S/P mitral valve repair 06/28/2012   Complex valvuloplasty including quadrangular resection of posterior leaflet, sliding leaflet plasty, artificial Gore-tex neocord placement x2 and 27mm Sorin Memo 3D ring annuloplasty via right mini thoracotomy    SBO (small bowel obstruction) (HCC)    Shortness of breath     Past Surgical History:  Procedure Laterality Date  CARDIAC CATHETERIZATION N/A 07/16/2015   Procedure: Left Heart Cath and Coronary Angiography;  Surgeon: Dixie Dials, MD;  Location: [place] CV LAB;  Service: Cardiovascular;  Laterality: N/A;   CARDIOVERSION N/A 07/04/2018   Procedure: CARDIOVERSION;  Surgeon: Acie Fredrickson Wonda Cheng, MD;  Location: [facility] ENDOSCOPY;  Service: Cardiovascular;  Laterality: N/A;   CARDIOVERSION N/A 10/19/2021   Procedure: CARDIOVERSION;  Surgeon: Lelon Perla, MD;  Location: [facility] ENDOSCOPY;  Service: Cardiovascular;  Laterality: N/A;   CORONARY ARTERY BYPASS  GRAFT     INTRAOPERATIVE TRANSESOPHAGEAL ECHOCARDIOGRAM N/A 06/28/2012   Procedure: INTRAOPERATIVE TRANSESOPHAGEAL ECHOCARDIOGRAM;  Surgeon: Rexene Alberts, MD;  Location: [place];  Service: Open Heart Surgery;  Laterality: N/A;   LAPAROSCOPY N/A 05/21/2016   Procedure: LAPAROSCOPY DIAGNOSTIC, LYSIS OF ADHESION BAND;  Surgeon: Ralene Ok, MD;  Location: [place];  Service: General;  Laterality: N/A;   LEFT AND RIGHT HEART CATHETERIZATION WITH CORONARY ANGIOGRAM N/A 05/22/2012   Procedure: LEFT AND RIGHT HEART CATHETERIZATION WITH CORONARY ANGIOGRAM;  Surgeon: Burnell Blanks, MD;  Location: [facility] CATH LAB;  Service: Cardiovascular;  Laterality: N/A;   MINIMALLY INVASIVE MAZE PROCEDURE N/A 06/28/2012   Procedure: MINIMALLY INVASIVE MAZE PROCEDURE;  Surgeon: Rexene Alberts, MD;  Location: [place];  Service: Open Heart Surgery;  Laterality: N/A;   MITRAL VALVE REPAIR Right 06/28/2012   Procedure: MINIMALLY INVASIVE MITRAL VALVE REPAIR (MVR);  Surgeon: Rexene Alberts, MD;  Location: [place];  Service: Open Heart Surgery;  Laterality: Right;   OVARIAN CYST SURGERY  1988   TEE WITHOUT CARDIOVERSION  05/19/2012   Procedure: TRANSESOPHAGEAL ECHOCARDIOGRAM (TEE);  Surgeon: Jolaine Artist, MD;  Location: [facility] ENDOSCOPY;  Service: Cardiovascular;  Laterality: N/A;   TEE WITHOUT CARDIOVERSION N/A 10/19/2021   Procedure: TRANSESOPHAGEAL ECHOCARDIOGRAM (TEE);  Surgeon: Lelon Perla, MD;  Location: [facility] ENDOSCOPY;  Service: Cardiovascular;  Laterality: N/A;   [place]   `      Current Medications: No outpatient medications have been marked as taking for the 10/29/21 encounter (Appointment) with Ann Maki, Lanice Schwab, NP.     Allergies:   Amiodarone, Diltiazem, Macrobid [nitrofurantoin monohyd macro], Mexiletine, Aspirin, Naproxen sodium, and Sulfa drugs cross reactors   Social History   Socioeconomic History   Marital status:  Divorced    Spouse name: Not on file   Number of children: Not on file   Years of education: Not on file   Highest education level: Not on file  Occupational History   Not on file  Tobacco Use   Smoking status: Never   Smokeless tobacco: Never  Vaping Use   Vaping Use: Never used  Substance and Sexual Activity   Alcohol use: No    Comment: 1 drink a month   Drug use: No   Sexual activity: Never  Other Topics Concern   Not on file  Social History Narrative   Divorced - lives alone - remains functionally independent.  Has 3 children.   Social Determinants of Health   Financial Resource Strain: Not on file  Food Insecurity: Not on file  Transportation Needs: Not on file  Physical Activity: Not on file  Stress: Not on file  Social Connections: Not on file     Family History: The patient's ***family history includes Colon cancer in her father.  ROS:   Please see the history of present illness.    *** All other systems reviewed  and are negative.  Labs/Other Studies Reviewed:    The following studies were reviewed today:  Echo 10/16/21   1. Left ventricular ejection fraction, by estimation, is 40 to 45%. The  left ventricle has mildly decreased function. The left ventricle  demonstrates regional wall motion abnormalities (see scoring  diagram/findings for description). Left ventricular  diastolic function could not be evaluated. There is severe hypokinesis of  the left ventricular, entire anteroseptal wall.   2. Right ventricular systolic function is normal. The right ventricular  size is small.   3. Left atrial size was mildly dilated.   4. The mitral valve is grossly normal. Mild mitral valve regurgitation.   5. Calcification of the non coronary cusp. . The aortic valve is  calcified. Aortic valve regurgitation is mild.   6. The inferior vena cava is normal in size with greater than 50%  respiratory variability, suggesting right atrial pressure of 3 mmHg   Recent  Labs: 10/15/2021: TSH 0.238 10/16/2021: ALT 9 10/18/2021: Hemoglobin 13.2; Platelets 258 10/24/2021: BUN 26; Creatinine, Ser 0.84; Magnesium 2.2; Potassium 4.4; Sodium 137  Recent Lipid Panel    Component Value Date/Time   CHOL 226 (H) 07/16/2015 0557   TRIG 74 07/16/2015 0557   HDL 45 07/16/2015 0557   CHOLHDL 5.0 07/16/2015 0557   VLDL 15 07/16/2015 0557   LDLCALC 166 (H) 07/16/2015 0557     Risk Assessment/Calculations:   {Does this patient have ATRIAL FIBRILLATION?[phone]}       Physical Exam:    VS:  There were no vitals taken for this visit.    Wt Readings from Last 3 Encounters:  10/19/21 94 lb 12.8 oz (43 kg)  09/17/19 92 lb 9.6 oz (42 kg)  07/05/19 90 lb (40.8 kg)     GEN: *** Well nourished, well developed in no acute distress HEENT: Normal NECK: No JVD; No carotid bruits CARDIAC: ***RRR, no murmurs, rubs, gallops RESPIRATORY:  Clear to auscultation without rales, wheezing or rhonchi  ABDOMEN: Soft, non-tender, non-distended MUSCULOSKELETAL:  No edema; No deformity. *** pedal pulses, ***bilaterally SKIN: Warm and dry NEUROLOGIC:  Alert and oriented x 3 PSYCHIATRIC:  Normal affect   EKG:  EKG is *** ordered today.  The ekg ordered today demonstrates ***  Diagnoses:    No diagnosis found. Assessment and Plan:       {Are you ordering a CV Procedure (e.g. stress test, cath, DCCV, TEE, etc)?   Press F2        :[id number]}    Medication Adjustments/Labs and Tests Ordered: Current medicines are reviewed at length with the patient today.  Concerns regarding medicines are outlined above.  No orders of the defined types were placed in this encounter.  No orders of the defined types were placed in this encounter.   There are no Patient Instructions on file for this visit.   Signed, Levi Aland, NP  10/27/2021 7:03 PM    [facility]

## 2021-10-28 DIAGNOSIS — I10 Essential (primary) hypertension: Secondary | ICD-10-CM | POA: Diagnosis not present

## 2021-10-28 DIAGNOSIS — S31809A Unspecified open wound of unspecified buttock, initial encounter: Secondary | ICD-10-CM | POA: Diagnosis not present

## 2021-10-28 DIAGNOSIS — G9340 Encephalopathy, unspecified: Secondary | ICD-10-CM | POA: Diagnosis not present

## 2021-10-28 DIAGNOSIS — F419 Anxiety disorder, unspecified: Secondary | ICD-10-CM | POA: Diagnosis not present

## 2021-10-28 DIAGNOSIS — I4819 Other persistent atrial fibrillation: Secondary | ICD-10-CM | POA: Diagnosis not present

## 2021-10-28 DIAGNOSIS — E0590 Thyrotoxicosis, unspecified without thyrotoxic crisis or storm: Secondary | ICD-10-CM | POA: Diagnosis not present

## 2021-10-29 ENCOUNTER — Ambulatory Visit: Payer: PPO | Admitting: Nurse Practitioner

## 2021-10-29 DIAGNOSIS — I10 Essential (primary) hypertension: Secondary | ICD-10-CM | POA: Diagnosis not present

## 2021-10-29 DIAGNOSIS — I4891 Unspecified atrial fibrillation: Secondary | ICD-10-CM | POA: Diagnosis not present

## 2021-10-29 DIAGNOSIS — E785 Hyperlipidemia, unspecified: Secondary | ICD-10-CM | POA: Diagnosis not present

## 2021-10-29 DIAGNOSIS — R5383 Other fatigue: Secondary | ICD-10-CM | POA: Diagnosis not present

## 2021-10-29 DIAGNOSIS — D649 Anemia, unspecified: Secondary | ICD-10-CM | POA: Diagnosis not present

## 2021-10-29 DIAGNOSIS — E0590 Thyrotoxicosis, unspecified without thyrotoxic crisis or storm: Secondary | ICD-10-CM | POA: Diagnosis not present

## 2021-10-30 DIAGNOSIS — S31809A Unspecified open wound of unspecified buttock, initial encounter: Secondary | ICD-10-CM | POA: Diagnosis not present

## 2021-10-30 DIAGNOSIS — G9340 Encephalopathy, unspecified: Secondary | ICD-10-CM | POA: Diagnosis not present

## 2021-10-30 DIAGNOSIS — I4819 Other persistent atrial fibrillation: Secondary | ICD-10-CM | POA: Diagnosis not present

## 2021-10-30 DIAGNOSIS — F419 Anxiety disorder, unspecified: Secondary | ICD-10-CM | POA: Diagnosis not present

## 2021-10-30 DIAGNOSIS — E0590 Thyrotoxicosis, unspecified without thyrotoxic crisis or storm: Secondary | ICD-10-CM | POA: Diagnosis not present

## 2021-10-30 DIAGNOSIS — I10 Essential (primary) hypertension: Secondary | ICD-10-CM | POA: Diagnosis not present

## 2021-11-03 DIAGNOSIS — I4891 Unspecified atrial fibrillation: Secondary | ICD-10-CM | POA: Diagnosis not present

## 2021-11-03 DIAGNOSIS — R5383 Other fatigue: Secondary | ICD-10-CM | POA: Diagnosis not present

## 2021-11-06 DIAGNOSIS — R300 Dysuria: Secondary | ICD-10-CM | POA: Diagnosis not present

## 2021-11-06 DIAGNOSIS — I4819 Other persistent atrial fibrillation: Secondary | ICD-10-CM | POA: Diagnosis not present

## 2021-11-06 DIAGNOSIS — R339 Retention of urine, unspecified: Secondary | ICD-10-CM | POA: Diagnosis not present

## 2021-11-10 DIAGNOSIS — I4819 Other persistent atrial fibrillation: Secondary | ICD-10-CM | POA: Diagnosis not present

## 2021-11-10 DIAGNOSIS — R410 Disorientation, unspecified: Secondary | ICD-10-CM | POA: Diagnosis not present

## 2021-11-10 DIAGNOSIS — N390 Urinary tract infection, site not specified: Secondary | ICD-10-CM | POA: Diagnosis not present

## 2021-11-10 DIAGNOSIS — R451 Restlessness and agitation: Secondary | ICD-10-CM | POA: Diagnosis not present

## 2021-11-10 DIAGNOSIS — F419 Anxiety disorder, unspecified: Secondary | ICD-10-CM | POA: Diagnosis not present

## 2021-11-12 DIAGNOSIS — R300 Dysuria: Secondary | ICD-10-CM | POA: Diagnosis not present

## 2021-11-12 DIAGNOSIS — I4891 Unspecified atrial fibrillation: Secondary | ICD-10-CM | POA: Diagnosis not present

## 2021-11-13 ENCOUNTER — Ambulatory Visit: Payer: PPO | Admitting: Cardiology

## 2021-11-13 ENCOUNTER — Encounter: Payer: Self-pay | Admitting: Cardiology

## 2021-11-13 VITALS — BP 143/59 | HR 49 | Temp 97.4°F | Resp 18 | Ht 66.0 in | Wt 97.4 lb

## 2021-11-13 DIAGNOSIS — I10 Essential (primary) hypertension: Secondary | ICD-10-CM

## 2021-11-13 DIAGNOSIS — I480 Paroxysmal atrial fibrillation: Secondary | ICD-10-CM

## 2021-11-13 DIAGNOSIS — K219 Gastro-esophageal reflux disease without esophagitis: Secondary | ICD-10-CM

## 2021-11-13 DIAGNOSIS — I428 Other cardiomyopathies: Secondary | ICD-10-CM

## 2021-11-13 DIAGNOSIS — R0989 Other specified symptoms and signs involving the circulatory and respiratory systems: Secondary | ICD-10-CM | POA: Diagnosis not present

## 2021-11-13 DIAGNOSIS — I4819 Other persistent atrial fibrillation: Secondary | ICD-10-CM | POA: Diagnosis not present

## 2021-11-13 DIAGNOSIS — I493 Ventricular premature depolarization: Secondary | ICD-10-CM

## 2021-11-13 MED ORDER — CARVEDILOL 12.5 MG PO TABS: 12.5000 mg | ORAL_TABLET | Freq: Two times a day (BID) | ORAL | 3 refills | Status: DC

## 2021-11-13 MED ORDER — FAMOTIDINE 40 MG PO TABS: 40.0000 mg | ORAL_TABLET | Freq: Every day | ORAL | 2 refills | Status: DC

## 2021-11-13 NOTE — Progress Notes (Unsigned)
Primary Physician/Referring:  Burnard Bunting, MD  Patient ID: [name] [name], female    DOB: 1941-03-02, 81 y.o.   MRN: [id number]  Chief Complaint  Patient presents with   Atrial Fibrillation   New Patient (Initial Visit)    Referred by Burnard Bunting, MD   HPI:    [name] [name]  is a 81 y.o. Caucasian female patient with mitral valve regurgitation SP mitral valve repair, left atrial appendage clipping and maze procedure in 2014, hypertension, hyperlipidemia, paroxysmal atrial fibrillation with contraindication to amiodarone due to thyroiditis, frequent PVCs on EKG monitoring of 22%, evaluated by EP and felt to be high risk for ablation and was started on sotalol. She was admitted to the hospital on 10/15/2021 and discharged on 10/26/2021 [facility] when she presented with A-fib with RVR and acute metabolic encephalopathy and subclinical hyperthyroidism.  She is now referred to me to establish cardiac care.  She is presently at [facility].  Doing well and she will be discharged home this coming Monday that is in 3 days from now.  She has no chest pain, no shortness of breath, no leg edema.  She is tolerating all medications well.  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 Dx 04/2012;  b. Amio d/c'd 2/2 hyperthyroidism;  c. Atrial flutter 10/2012;  d. On xarelto.   Bigeminy    CHF (congestive heart failure) (HCC)    Complication of anesthesia    Coronary artery disease    Diverticulitis    GERD (gastroesophageal reflux disease)    Goiter    Headache    Heart murmur    Hyperlipidemia    recently- taken off Crestor- due to pain in her legs, but this was after cardiac cath. , pt. questioning whether the pain in her legs was related to cath. or  crestor   Hypertension    Then hypotension 04/2012 limiting med adjustment.   Intractable vomiting    Malnutrition of moderate degree (HCC)    Microscopic hematuria 08/20/2012   Mitral valve prolapse    a. H/o MVP, with severe MR 04/2012, had MV repair and Maze   Pneumonia    treated /w antibiotic- [facility]- early Feb. 2014   PONV (postoperative nausea and vomiting)    Refusal of blood transfusions as patient is Jehovah's Witness    S/P Maze operation for atrial fibrillation 06/28/2012   Complete bilateral atrial lesion set using cryothermy ablation with oversewing of LA appendage via right minithoracotomy   S/P mitral valve repair 06/28/2012   Complex valvuloplasty including quadrangular resection of posterior leaflet, sliding leaflet plasty, artificial Gore-tex neocord placement x2 and 25mm Sorin Memo 3D ring annuloplasty via right mini thoracotomy    SBO (small bowel obstruction) ([place])    Shortness of breath    Past Surgical History:  Procedure Laterality Date   CARDIAC CATHETERIZATION N/A 07/16/2015   Procedure: Left Heart Cath and Coronary Angiography;  Surgeon: Dixie Dials, MD;  Location: [facility] LAB;  Service: Cardiovascular;  Laterality: N/A;   CARDIOVERSION N/A 07/04/2018   Procedure: CARDIOVERSION;  Surgeon: Acie Fredrickson Wonda Cheng, MD;  Location: [facility] ENDOSCOPY;  Service: Cardiovascular;  Laterality: N/A;   CARDIOVERSION N/A 10/19/2021   Procedure: CARDIOVERSION;  Surgeon: Kirk Ruths  S, MD;  Location: [facility] ENDOSCOPY;  Service: Cardiovascular;  Laterality: N/A;   CORONARY ARTERY BYPASS GRAFT     INTRAOPERATIVE TRANSESOPHAGEAL ECHOCARDIOGRAM N/A 06/28/2012   Procedure: INTRAOPERATIVE TRANSESOPHAGEAL ECHOCARDIOGRAM;  Surgeon: Purcell Nails, MD;  Location: [facility] OR;  Service: Open Heart Surgery;  Laterality: N/A;   LAPAROSCOPY N/A 05/21/2016   Procedure: LAPAROSCOPY DIAGNOSTIC, LYSIS OF ADHESION BAND;  Surgeon: Axel Filler, MD;  Location: [facility] OR;  Service: General;  Laterality: N/A;   LEFT  AND RIGHT HEART CATHETERIZATION WITH CORONARY ANGIOGRAM N/A 05/22/2012   Procedure: LEFT AND RIGHT HEART CATHETERIZATION WITH CORONARY ANGIOGRAM;  Surgeon: Kathleene Hazel, MD;  Location: [facility] CATH LAB;  Service: Cardiovascular;  Laterality: N/A;   MINIMALLY INVASIVE MAZE PROCEDURE N/A 06/28/2012   Procedure: MINIMALLY INVASIVE MAZE PROCEDURE;  Surgeon: Purcell Nails, MD;  Location: [facility] OR;  Service: Open Heart Surgery;  Laterality: N/A;   MITRAL VALVE REPAIR Right 06/28/2012   Procedure: MINIMALLY INVASIVE MITRAL VALVE REPAIR (MVR);  Surgeon: Purcell Nails, MD;  Location: [facility] OR;  Service: Open Heart Surgery;  Laterality: Right;   OVARIAN CYST SURGERY  1988   TEE WITHOUT CARDIOVERSION  05/19/2012   Procedure: TRANSESOPHAGEAL ECHOCARDIOGRAM (TEE);  Surgeon: Dolores Patty, MD;  Location: [facility] ENDOSCOPY;  Service: Cardiovascular;  Laterality: N/A;   TEE WITHOUT CARDIOVERSION N/A 10/19/2021   Procedure: TRANSESOPHAGEAL ECHOCARDIOGRAM (TEE);  Surgeon: Lewayne Bunting, MD;  Location: [facility] ENDOSCOPY;  Service: Cardiovascular;  Laterality: N/A;   TUBAL LIGATION  1974   URETHRAL STRICTURE DILATATION     UTERINE FIBROID SURGERY  1988   `     Family History  Problem Relation Age of Onset   Colon cancer Father     Social History   Tobacco Use   Smoking status: Never   Smokeless tobacco: Never  Substance Use Topics   Alcohol use: No    Comment: 1 drink a month   Marital Status: Divorced  ROS  Review of Systems  Cardiovascular:  Negative for chest pain, dyspnea on exertion and leg swelling.   Objective  Blood pressure (!) 143/59, pulse (!) 49, temperature (!) 97.4 F (36.3 C), temperature source Temporal, resp. rate 18, height 5\' 6"  (1.676 m), weight 97 lb 6.4 oz (44.2 kg), SpO2 99 %. Body mass index is 15.72 kg/m.     11/13/2021    9:21 AM 11/13/2021    9:11 AM 10/26/2021    2:00 PM  Vitals with BMI  Height  5\' 6"    Weight  97 lbs 6 oz   BMI  15.73   Systolic 143 128 12/27/2021  Diastolic  59 36 67  Pulse 49 33 91     Physical Exam Constitutional:      Appearance: She is underweight.  Neck:     Vascular: No JVD.  Cardiovascular:     Rate and Rhythm: Normal rate and regular rhythm. Frequent Extrasystoles are present.    Pulses: Intact distal pulses.     Heart sounds: Murmur heard.     Early systolic murmur is present with a grade of 2/6 at the lower right sternal border.     No gallop.  Pulmonary:     Effort: Pulmonary effort is normal.     Breath sounds: Normal breath sounds.  Abdominal:     General: Bowel sounds are normal.     Palpations: Abdomen is soft.  Musculoskeletal:     Right lower leg: No edema.     Left lower leg:  No edema.     Medications and allergies   Allergies  Allergen Reactions   Amiodarone Other (See Comments)    Hyperthyroidism    Diltiazem Other (See Comments)    Put into hospital for 3 days   Macrobid [Nitrofurantoin Monohyd Macro] Nausea Only   Mexiletine Other (See Comments)    Confusion, pain in the face   Aspirin Rash    Tolerates low dose aspirin   Naproxen Sodium Swelling and Rash   Sulfa Drugs Cross Reactors Other (See Comments)    Allergy per mother - unknown reaction     Medication list after today's encounter   Current Outpatient Medications:    acetaminophen (TYLENOL) 325 MG tablet, Take 2 tablets (650 mg total) by mouth every 6 (six) hours as needed for mild pain (or Fever >/= 101)., Disp: , Rfl:    famotidine (PEPCID) 40 MG tablet, Take 1 tablet (40 mg total) by mouth daily., Disp: 30 tablet, Rfl: 2   feeding supplement (ENSURE ENLIVE / ENSURE PLUS) LIQD, Take 237 mLs by mouth 2 (two) times daily between meals., Disp: 237 mL, Rfl: 12   Multiple Vitamin (MULTIVITAMIN WITH MINERALS) TABS tablet, Take 1 tablet by mouth daily., Disp:  , Rfl:    polyethylene glycol (MIRALAX) 17 g packet, Take 17 g by mouth daily. (Patient taking differently: Take 17 g by mouth daily as needed (constipation).), Disp: 14 each, Rfl: 0    Rivaroxaban (XARELTO) 15 MG TABS tablet, Take 1 tablet (15 mg total) by mouth daily with supper. Pt. Needs to make an appt .with Dr. Curt Bears in order to receive future refills. Thank You. 2nd Attempt., Disp: 15 tablet, Rfl: 0   sotalol (BETAPACE) 120 MG tablet, Take 1 tablet (120 mg total) by mouth daily., Disp: , Rfl:    carvedilol (COREG) 12.5 MG tablet, Take 1 tablet (12.5 mg total) by mouth 2 (two) times daily with a meal., Disp: 180 tablet, Rfl: 3  Laboratory examination:   Lab Results  Component Value Date   NA 137 10/24/2021   K 4.4 10/24/2021   CO2 28 10/24/2021   GLUCOSE 102 (H) 10/24/2021   BUN 26 (H) 10/24/2021   CREATININE 0.84 10/24/2021   CALCIUM 9.4 10/24/2021   GFRNONAA >60 10/24/2021       Latest Ref Rng & Units 10/24/2021    3:33 PM 10/21/2021    7:52 AM 10/18/2021    7:33 AM  CMP  Glucose 70 - 99 mg/dL 102  103  118   BUN 8 - 23 mg/dL 26  12  25    Creatinine 0.44 - 1.00 mg/dL 0.84  0.69  0.99   Sodium 135 - 145 mmol/L 137  141  139   Potassium 3.5 - 5.1 mmol/L 4.4  3.7  4.0   Chloride 98 - 111 mmol/L 104  106  105   CO2 22 - 32 mmol/L 28  27  28    Calcium 8.9 - 10.3 mg/dL 9.4  10.0  10.3       Latest Ref Rng & Units 10/18/2021    7:33 AM 10/16/2021    5:26 AM 10/15/2021    9:28 AM  CBC  WBC 4.0 - 10.5 K/uL 10.4  12.1  9.0   Hemoglobin 12.0 - 15.0 g/dL 13.2  13.2  14.3   Hematocrit 36.0 - 46.0 % 40.3  39.6  43.3   Platelets 150 - 400 K/uL 258  258  246     Lipid Panel No results  for input(s): "CHOL", "TRIG", "[place]", "VLDL", "HDL", "CHOLHDL", "LDLDIRECT" in the last 8760 hours.  HEMOGLOBIN A1C Lab Results  Component Value Date   HGBA1C 6.0 (H) 06/15/2012   MPG 126 (H) 06/15/2012   TSH Recent Labs    10/15/21 0929  TSH 0.238*    External labs:   NA  Radiology:    Cardiac Studies:   TEE 10/19/2021: 1. Definity used to assess left atrial appendage; no thrombus noted.  2. Left ventricular ejection fraction, by estimation, is 40 to 45%.  The left ventricle has mildly decreased function. The left ventricle demonstrates global hypokinesis.  3. Right ventricular systolic function is normal. The right ventricular size is normal.  4. Left atrial size was mildly dilated. No left atrial/left atrial appendage thrombus was detected.  5. The mitral valve has been repaired/replaced. Mild mitral valve regurgitation. No evidence of mitral stenosis. There is a mitral valve ring present..  6. The aortic valve is tricuspid. Aortic valve regurgitation is mild. Aortic valve sclerosis is present, with no evidence of aortic valve stenosis.  Left and right heart catheterization 07/16/2015: Normal coronary arteries and normal right heart pressures.  Holter monitor 09/19/2018: Very frequent PVCs most of them single PVCs.  (Approximately 25%) Episode of supraventricular tachycardia at the rate of 130 bpm for approximately 30 minutes No indications of conduction abnormalities  EKG:   EKG 11/13/2021: Sinus rhythm with first-degree block at rate of 73 bpm, left atrial enlargement, normal axis.  Poor R progression, cannot exclude anteroseptal infarct old.  Nonspecific T abnormality.  Normal QT interval.  Frequent PVCs in bigeminal pattern.    Assessment     ICD-10-CM   1. Paroxysmal atrial fibrillation (HCC)  I48.0 EKG 12-Lead    2. Nonischemic cardiomyopathy (HCC)  I42.8     3. Frequent unifocal PVCs  I49.3 carvedilol (COREG) 12.5 MG tablet    4. Primary hypertension  I10     5. Gastroesophageal reflux disease without esophagitis  K21.9 famotidine (PEPCID) 40 MG tablet       Orders Placed This Encounter  Procedures   EKG 12-Lead   CHA2DS2-VASc Score is 4.  Yearly risk of stroke: 5% (F, A, HTN).  Score of 1=0.6; 2=2.2; 3=3.2; 4=4.8; 5=7.2; 6=9.8; 7=>9.8) -(CHF; HTN; vasc disease DM,  Female = 1; Age <65 =0; 65-74 = 1,  >75 =2; stroke/embolism= 2).   Meds ordered this encounter  Medications   carvedilol (COREG) 12.5 MG tablet    Sig: Take  1 tablet (12.5 mg total) by mouth 2 (two) times daily with a meal.    Dispense:  180 tablet    Refill:  3   famotidine (PEPCID) 40 MG tablet    Sig: Take 1 tablet (40 mg total) by mouth daily.    Dispense:  30 tablet    Refill:  2   Medications Discontinued During This Encounter  Medication Reason   carvedilol (COREG) 6.25 MG tablet Reorder   ciprofloxacin (CIPRO) 500 MG tablet Completed Course   ALPRAZolam (XANAX) 0.25 MG tablet Patient Preference     Recommendations:   [name] [name] is a 81 y.o.  Caucasian female patient with mitral valve regurgitation SP mitral valve repair, left atrial appendage clipping and maze procedure in 2014, hypertension, hyperlipidemia, paroxysmal atrial fibrillation with contraindication to amiodarone due to thyroiditis, frequent PVCs on EKG monitoring of 22%, evaluated by EP and felt to be high risk for ablation and was started on sotalol. She was admitted to the hospital on 10/15/2021  and discharged on 10/26/2021 [facility] when she presented with A-fib with RVR and acute metabolic encephalopathy and subclinical hyperthyroidism.  She is now referred to me to establish cardiac care.  Fortunately she is doing well, there is no clinical evidence of heart failure, on the EKG she does have frequent PVCs.  She has no room for guideline directed medical therapy both for cardiomyopathy and frequent PVCs, will increase carvedilol from 6.25 mg to 12.5 mg twice daily.  She was previously on losartan which was held due to metabolic encephalopathy and I will reinitiate this on her next office visit in 4 weeks when she remains stable.  Renal function has normalized.  She is presently on sotalol both for atrial fibrillation and frequent PVCs.  We could still consider uptitrating the dose depending upon frequency of PVCs on her next office visit.  She has had new onset cardiomyopathy with mildly reduced LVEF due to frequent PVCs and a PVC burden is 25%.  She did not tolerate Amiodarone (thyrotoxicosis) and Mexiletine (consufion). Could consider uptitrating her sotalol.   Otherwise she is on appropriate medical therapy including Xarelto for anticoagulation.    Yates Decamp, MD, [facility] 11/14/2021, 7:03 AM Office: [phone]

## 2021-11-14 ENCOUNTER — Encounter: Payer: Self-pay | Admitting: Cardiology

## 2021-11-20 DIAGNOSIS — I4819 Other persistent atrial fibrillation: Secondary | ICD-10-CM | POA: Diagnosis not present

## 2021-11-20 DIAGNOSIS — R001 Bradycardia, unspecified: Secondary | ICD-10-CM | POA: Diagnosis not present

## 2021-11-23 DIAGNOSIS — I10 Essential (primary) hypertension: Secondary | ICD-10-CM | POA: Diagnosis not present

## 2021-11-23 DIAGNOSIS — D649 Anemia, unspecified: Secondary | ICD-10-CM | POA: Diagnosis not present

## 2021-11-23 DIAGNOSIS — I5032 Chronic diastolic (congestive) heart failure: Secondary | ICD-10-CM | POA: Diagnosis not present

## 2021-11-23 DIAGNOSIS — F419 Anxiety disorder, unspecified: Secondary | ICD-10-CM | POA: Diagnosis not present

## 2021-11-23 DIAGNOSIS — E785 Hyperlipidemia, unspecified: Secondary | ICD-10-CM | POA: Diagnosis not present

## 2021-11-23 DIAGNOSIS — I4819 Other persistent atrial fibrillation: Secondary | ICD-10-CM | POA: Diagnosis not present

## 2021-11-26 DIAGNOSIS — I350 Nonrheumatic aortic (valve) stenosis: Secondary | ICD-10-CM | POA: Diagnosis not present

## 2021-11-26 DIAGNOSIS — F419 Anxiety disorder, unspecified: Secondary | ICD-10-CM | POA: Diagnosis not present

## 2021-11-26 DIAGNOSIS — D649 Anemia, unspecified: Secondary | ICD-10-CM | POA: Diagnosis not present

## 2021-11-26 DIAGNOSIS — G9341 Metabolic encephalopathy: Secondary | ICD-10-CM | POA: Diagnosis not present

## 2021-11-26 DIAGNOSIS — I2510 Atherosclerotic heart disease of native coronary artery without angina pectoris: Secondary | ICD-10-CM | POA: Diagnosis not present

## 2021-11-26 DIAGNOSIS — E785 Hyperlipidemia, unspecified: Secondary | ICD-10-CM | POA: Diagnosis not present

## 2021-11-26 DIAGNOSIS — Z8701 Personal history of pneumonia (recurrent): Secondary | ICD-10-CM | POA: Diagnosis not present

## 2021-11-26 DIAGNOSIS — E0501 Thyrotoxicosis with diffuse goiter with thyrotoxic crisis or storm: Secondary | ICD-10-CM | POA: Diagnosis not present

## 2021-11-26 DIAGNOSIS — Z9181 History of falling: Secondary | ICD-10-CM | POA: Diagnosis not present

## 2021-11-26 DIAGNOSIS — Z7901 Long term (current) use of anticoagulants: Secondary | ICD-10-CM | POA: Diagnosis not present

## 2021-11-26 DIAGNOSIS — I4819 Other persistent atrial fibrillation: Secondary | ICD-10-CM | POA: Diagnosis not present

## 2021-11-26 DIAGNOSIS — K219 Gastro-esophageal reflux disease without esophagitis: Secondary | ICD-10-CM | POA: Diagnosis not present

## 2021-11-26 DIAGNOSIS — L89151 Pressure ulcer of sacral region, stage 1: Secondary | ICD-10-CM | POA: Diagnosis not present

## 2021-11-26 DIAGNOSIS — I5042 Chronic combined systolic (congestive) and diastolic (congestive) heart failure: Secondary | ICD-10-CM | POA: Diagnosis not present

## 2021-11-26 DIAGNOSIS — R001 Bradycardia, unspecified: Secondary | ICD-10-CM | POA: Diagnosis not present

## 2021-11-26 DIAGNOSIS — I110 Hypertensive heart disease with heart failure: Secondary | ICD-10-CM | POA: Diagnosis not present

## 2021-12-09 DIAGNOSIS — R001 Bradycardia, unspecified: Secondary | ICD-10-CM | POA: Diagnosis not present

## 2021-12-09 DIAGNOSIS — Z7901 Long term (current) use of anticoagulants: Secondary | ICD-10-CM | POA: Diagnosis not present

## 2021-12-09 DIAGNOSIS — E785 Hyperlipidemia, unspecified: Secondary | ICD-10-CM | POA: Diagnosis not present

## 2021-12-09 DIAGNOSIS — I350 Nonrheumatic aortic (valve) stenosis: Secondary | ICD-10-CM | POA: Diagnosis not present

## 2021-12-09 DIAGNOSIS — I4819 Other persistent atrial fibrillation: Secondary | ICD-10-CM | POA: Diagnosis not present

## 2021-12-09 DIAGNOSIS — I5042 Chronic combined systolic (congestive) and diastolic (congestive) heart failure: Secondary | ICD-10-CM | POA: Diagnosis not present

## 2021-12-09 DIAGNOSIS — I2510 Atherosclerotic heart disease of native coronary artery without angina pectoris: Secondary | ICD-10-CM | POA: Diagnosis not present

## 2021-12-09 DIAGNOSIS — Z8701 Personal history of pneumonia (recurrent): Secondary | ICD-10-CM | POA: Diagnosis not present

## 2021-12-09 DIAGNOSIS — E0501 Thyrotoxicosis with diffuse goiter with thyrotoxic crisis or storm: Secondary | ICD-10-CM | POA: Diagnosis not present

## 2021-12-09 DIAGNOSIS — F419 Anxiety disorder, unspecified: Secondary | ICD-10-CM | POA: Diagnosis not present

## 2021-12-09 DIAGNOSIS — I110 Hypertensive heart disease with heart failure: Secondary | ICD-10-CM | POA: Diagnosis not present

## 2021-12-09 DIAGNOSIS — L89151 Pressure ulcer of sacral region, stage 1: Secondary | ICD-10-CM | POA: Diagnosis not present

## 2021-12-09 DIAGNOSIS — G9341 Metabolic encephalopathy: Secondary | ICD-10-CM | POA: Diagnosis not present

## 2021-12-09 DIAGNOSIS — Z9181 History of falling: Secondary | ICD-10-CM | POA: Diagnosis not present

## 2021-12-09 DIAGNOSIS — D649 Anemia, unspecified: Secondary | ICD-10-CM | POA: Diagnosis not present

## 2021-12-09 DIAGNOSIS — K219 Gastro-esophageal reflux disease without esophagitis: Secondary | ICD-10-CM | POA: Diagnosis not present

## 2021-12-10 ENCOUNTER — Ambulatory Visit: Payer: PPO | Admitting: Cardiology

## 2021-12-16 DIAGNOSIS — F419 Anxiety disorder, unspecified: Secondary | ICD-10-CM | POA: Diagnosis not present

## 2021-12-16 DIAGNOSIS — R001 Bradycardia, unspecified: Secondary | ICD-10-CM | POA: Diagnosis not present

## 2021-12-16 DIAGNOSIS — E785 Hyperlipidemia, unspecified: Secondary | ICD-10-CM | POA: Diagnosis not present

## 2021-12-16 DIAGNOSIS — K219 Gastro-esophageal reflux disease without esophagitis: Secondary | ICD-10-CM | POA: Diagnosis not present

## 2021-12-16 DIAGNOSIS — G9341 Metabolic encephalopathy: Secondary | ICD-10-CM | POA: Diagnosis not present

## 2021-12-16 DIAGNOSIS — I2510 Atherosclerotic heart disease of native coronary artery without angina pectoris: Secondary | ICD-10-CM | POA: Diagnosis not present

## 2021-12-16 DIAGNOSIS — Z8701 Personal history of pneumonia (recurrent): Secondary | ICD-10-CM | POA: Diagnosis not present

## 2021-12-16 DIAGNOSIS — I4819 Other persistent atrial fibrillation: Secondary | ICD-10-CM | POA: Diagnosis not present

## 2021-12-16 DIAGNOSIS — E0501 Thyrotoxicosis with diffuse goiter with thyrotoxic crisis or storm: Secondary | ICD-10-CM | POA: Diagnosis not present

## 2021-12-16 DIAGNOSIS — I5042 Chronic combined systolic (congestive) and diastolic (congestive) heart failure: Secondary | ICD-10-CM | POA: Diagnosis not present

## 2021-12-16 DIAGNOSIS — Z7901 Long term (current) use of anticoagulants: Secondary | ICD-10-CM | POA: Diagnosis not present

## 2021-12-16 DIAGNOSIS — Z9181 History of falling: Secondary | ICD-10-CM | POA: Diagnosis not present

## 2021-12-16 DIAGNOSIS — I350 Nonrheumatic aortic (valve) stenosis: Secondary | ICD-10-CM | POA: Diagnosis not present

## 2021-12-16 DIAGNOSIS — I110 Hypertensive heart disease with heart failure: Secondary | ICD-10-CM | POA: Diagnosis not present

## 2021-12-16 DIAGNOSIS — L89151 Pressure ulcer of sacral region, stage 1: Secondary | ICD-10-CM | POA: Diagnosis not present

## 2021-12-16 DIAGNOSIS — D649 Anemia, unspecified: Secondary | ICD-10-CM | POA: Diagnosis not present

## 2021-12-21 DIAGNOSIS — Z8701 Personal history of pneumonia (recurrent): Secondary | ICD-10-CM | POA: Diagnosis not present

## 2021-12-21 DIAGNOSIS — I110 Hypertensive heart disease with heart failure: Secondary | ICD-10-CM | POA: Diagnosis not present

## 2021-12-21 DIAGNOSIS — L89151 Pressure ulcer of sacral region, stage 1: Secondary | ICD-10-CM | POA: Diagnosis not present

## 2021-12-21 DIAGNOSIS — F419 Anxiety disorder, unspecified: Secondary | ICD-10-CM | POA: Diagnosis not present

## 2021-12-21 DIAGNOSIS — E785 Hyperlipidemia, unspecified: Secondary | ICD-10-CM | POA: Diagnosis not present

## 2021-12-21 DIAGNOSIS — I350 Nonrheumatic aortic (valve) stenosis: Secondary | ICD-10-CM | POA: Diagnosis not present

## 2021-12-21 DIAGNOSIS — E0501 Thyrotoxicosis with diffuse goiter with thyrotoxic crisis or storm: Secondary | ICD-10-CM | POA: Diagnosis not present

## 2021-12-21 DIAGNOSIS — I5042 Chronic combined systolic (congestive) and diastolic (congestive) heart failure: Secondary | ICD-10-CM | POA: Diagnosis not present

## 2021-12-21 DIAGNOSIS — I4819 Other persistent atrial fibrillation: Secondary | ICD-10-CM | POA: Diagnosis not present

## 2021-12-21 DIAGNOSIS — Z7901 Long term (current) use of anticoagulants: Secondary | ICD-10-CM | POA: Diagnosis not present

## 2021-12-21 DIAGNOSIS — I2510 Atherosclerotic heart disease of native coronary artery without angina pectoris: Secondary | ICD-10-CM | POA: Diagnosis not present

## 2021-12-21 DIAGNOSIS — R001 Bradycardia, unspecified: Secondary | ICD-10-CM | POA: Diagnosis not present

## 2021-12-21 DIAGNOSIS — D649 Anemia, unspecified: Secondary | ICD-10-CM | POA: Diagnosis not present

## 2021-12-21 DIAGNOSIS — G9341 Metabolic encephalopathy: Secondary | ICD-10-CM | POA: Diagnosis not present

## 2021-12-21 DIAGNOSIS — K219 Gastro-esophageal reflux disease without esophagitis: Secondary | ICD-10-CM | POA: Diagnosis not present

## 2021-12-21 DIAGNOSIS — Z9181 History of falling: Secondary | ICD-10-CM | POA: Diagnosis not present

## 2021-12-22 ENCOUNTER — Emergency Department (HOSPITAL_COMMUNITY)
Admission: EM | Admit: 2021-12-22 | Discharge: 2021-12-23 | Disposition: A | Discharge status: Home / Self Care | Payer: PPO | Attending: Emergency Medicine | Admitting: Emergency Medicine

## 2021-12-22 ENCOUNTER — Encounter (HOSPITAL_COMMUNITY): Payer: Self-pay

## 2021-12-22 ENCOUNTER — Emergency Department (HOSPITAL_COMMUNITY): Payer: PPO

## 2021-12-22 ENCOUNTER — Other Ambulatory Visit: Payer: Self-pay

## 2021-12-22 DIAGNOSIS — R000 Tachycardia, unspecified: Secondary | ICD-10-CM | POA: Diagnosis not present

## 2021-12-22 DIAGNOSIS — I4892 Unspecified atrial flutter: Secondary | ICD-10-CM | POA: Diagnosis not present

## 2021-12-22 DIAGNOSIS — Z7901 Long term (current) use of anticoagulants: Secondary | ICD-10-CM | POA: Insufficient documentation

## 2021-12-22 DIAGNOSIS — F0390 Unspecified dementia without behavioral disturbance: Secondary | ICD-10-CM | POA: Insufficient documentation

## 2021-12-22 DIAGNOSIS — R079 Chest pain, unspecified: Secondary | ICD-10-CM | POA: Diagnosis not present

## 2021-12-22 LAB — COMPREHENSIVE METABOLIC PANEL
ALT: 9 U/L (ref 0–44)
AST: 20 U/L (ref 15–41)
Albumin: 3.4 g/dL — ABNORMAL LOW (ref 3.5–5.0)
Alkaline Phosphatase: 49 U/L (ref 38–126)
Anion gap: 3 — ABNORMAL LOW (ref 5–15)
BUN: 10 mg/dL (ref 8–23)
CO2: 29 mmol/L (ref 22–32)
Calcium: 9.6 mg/dL (ref 8.9–10.3)
Chloride: 108 mmol/L (ref 98–111)
Creatinine, Ser: 0.8 mg/dL (ref 0.44–1.00)
GFR, Estimated: >60 mL/min (ref >60)
Glucose, Bld: 94 mg/dL (ref 70–99)
Potassium: 4.3 mmol/L (ref 3.5–5.1)
Sodium: 140 mmol/L (ref 135–145)
Total Bilirubin: 0.5 mg/dL (ref 0.3–1.2)
Total Protein: 6.2 g/dL — ABNORMAL LOW (ref 6.5–8.1)

## 2021-12-22 LAB — URINALYSIS, ROUTINE W REFLEX MICROSCOPIC
Bacteria, UA: NONE SEEN
Bilirubin Urine: NEGATIVE
Glucose, UA: NEGATIVE mg/dL
Ketones, ur: NEGATIVE mg/dL
Leukocytes,Ua: NEGATIVE
Nitrite: NEGATIVE
Protein, ur: NEGATIVE mg/dL
Specific Gravity, Urine: 1.005 (ref 1.005–1.030)
pH: 8 (ref 5.0–8.0)

## 2021-12-22 LAB — CBC
HCT: 37.6 % (ref 36.0–46.0)
Hemoglobin: 12.1 g/dL (ref 12.0–15.0)
MCH: 30.1 pg (ref 26.0–34.0)
MCHC: 32.2 g/dL (ref 30.0–36.0)
MCV: 93.5 fL (ref 80.0–100.0)
Platelets: 203 10*3/uL (ref 150–400)
RBC: 4.02 MIL/uL (ref 3.87–5.11)
RDW: 14.1 % (ref 11.5–15.5)
WBC: 6.5 10*3/uL (ref 4.0–10.5)
nRBC: 0 % (ref 0.0–0.2)

## 2021-12-22 LAB — TROPONIN I (HIGH SENSITIVITY)
Troponin I (High Sensitivity): 6 ng/L (ref <18)
Troponin I (High Sensitivity): 8 ng/L (ref <18)

## 2021-12-22 LAB — MAGNESIUM: Magnesium: 1.9 mg/dL (ref 1.7–2.4)

## 2021-12-22 MED ORDER — LACTATED RINGERS IV SOLN: INTRAVENOUS | Status: DC

## 2021-12-22 MED ORDER — RIVAROXABAN 15 MG PO TABS
15.0000 mg | ORAL_TABLET | Freq: Every day | ORAL | Status: DC
  Administered 2021-12-22: 15 mg via ORAL
  Filled 2021-12-22: qty 1

## 2021-12-22 MED ORDER — METOPROLOL TARTRATE 5 MG/5ML IV SOLN
5.0000 mg | Freq: Once | INTRAVENOUS | Status: AC
  Administered 2021-12-22: 5 mg via INTRAVENOUS
  Filled 2021-12-22: qty 5

## 2021-12-22 MED ORDER — SOTALOL HCL 120 MG PO TABS
120.0000 mg | ORAL_TABLET | Freq: Once | ORAL | Status: AC
  Administered 2021-12-23: 120 mg via ORAL
  Filled 2021-12-22: qty 1

## 2021-12-22 NOTE — ED Notes (Signed)
Pt states that she no longer feels safe going home at this time. Pt states "I came here because my heart was beating too fast and now it feels that way again". MD Schlossman made aware, order for repeat EKG placed.

## 2021-12-22 NOTE — ED Provider Triage Note (Signed)
Emergency Medicine Provider Triage Evaluation Note  [name] [name] , a 81 y.o. female  was evaluated in triage.  Patient's home health nurse has not seen her in a month and family was let into the house patient was in A-fib with a heart rate in the 130s.  She is forgetful, question dementia and likely has not been taking her medications.  History of A-fib  Review of Systems  Positive:  Negative: Shortness of breath, dizziness, fatigue  Physical Exam  BP 127/66 (BP Location: Left Arm)   Pulse (!) 133   Temp 98.2 F (36.8 C) (Oral)   Resp 16   Ht 5\' 6"  (1.676 m)   Wt 44 kg   SpO2 100%   BMI 15.66 kg/m  Gen:   Awake, no distress   Resp:  Normal effort  MSK:   Moves extremities without difficulty  Other:  Tachycardic, A-fib  Medical Decision Making  Medically screening exam initiated at 1:28 PM.  Appropriate orders placed.  LAMARA BRECHT was informed that the remainder of the evaluation will be completed by another provider, this initial triage assessment does not replace that evaluation, and the importance of remaining in the ED until their evaluation is complete.  A-fib RVR, discharge notified that patient needs room ASAP   [name], PA-C 12/22/21 1329

## 2021-12-22 NOTE — ED Triage Notes (Signed)
HH RN went to check on patient and has not been let in the house x 4 weeks bc patient wont let her in.  Today discovered she is in afib 130's.  Patient has a hx and patient is forgetting to take medicaitons.  Patient had no complaints.

## 2021-12-22 NOTE — ED Notes (Signed)
Repeat EKG given to MD [facility]

## 2021-12-22 NOTE — ED Provider Notes (Signed)
[facility] EMERGENCY DEPARTMENT Provider Note   CSN: [id number] Arrival date & time: 12/22/21  1321     History  Chief Complaint  Patient presents with   Atrial Fibrillation    [name] [name] is a 81 y.o. female.  81 year old female who presents due to increased heart rate from home.  Patient states that she has not had her medications for A-fib the last several days.  Home health aide came and saw her today and took her pulse rate and it was in the 130s and concerning for A-fib.  Patient states that she has not had any recent illnesses.  Denies any fever, cough, congestion.  No chest pain or chest pressure.  No volume loss.  Has history of dementia and states that she sometimes forgets to take her medications.  Is unsure of when she had her last dose of Xarelto       Home Medications Prior to Admission medications   Medication Sig Start Date End Date Taking? Authorizing Provider  acetaminophen (TYLENOL) 325 MG tablet Take 2 tablets (650 mg total) by mouth every 6 (six) hours as needed for mild pain (or Fever >/= 101). 10/26/21   Danford, Earl Lites, MD  carvedilol (COREG) 12.5 MG tablet Take 1 tablet (12.5 mg total) by mouth 2 (two) times daily with a meal. 11/13/21   Yates Decamp, MD  famotidine (PEPCID) 40 MG tablet Take 1 tablet (40 mg total) by mouth daily. 11/13/21   Yates Decamp, MD  feeding supplement (ENSURE ENLIVE / ENSURE PLUS) LIQD Take 237 mLs by mouth 2 (two) times daily between meals. 10/26/21   Danford, Earl Lites, MD  Multiple Vitamin (MULTIVITAMIN WITH MINERALS) TABS tablet Take 1 tablet by mouth daily. 04/26/19   Briant Cedar, MD  polyethylene glycol (MIRALAX) 17 g packet Take 17 g by mouth daily. Patient taking differently: Take 17 g by mouth daily as needed (constipation). 06/23/19   Rancour, Jeannett Senior, MD  Rivaroxaban (XARELTO) 15 MG TABS tablet Take 1 tablet (15 mg total) by mouth daily with supper. Pt. Needs to make an appt .with Dr.  Elberta Fortis in order to receive future refills. Thank You. 2nd Attempt. 09/29/21   Camnitz, Andree Coss, MD  sotalol (BETAPACE) 120 MG tablet Take 1 tablet (120 mg total) by mouth daily. 10/27/21   Danford, Earl Lites, MD      Allergies    Amiodarone, Diltiazem, Macrobid [nitrofurantoin monohyd macro], Mexiletine, Aspirin, Naproxen sodium, and Sulfa drugs cross reactors    Review of Systems   Review of Systems  All other systems reviewed and are negative.   Physical Exam Updated Vital Signs BP (!) 157/69   Pulse (!) 128   Temp 98.2 F (36.8 C) (Oral)   Resp 16   Ht 1.676 m (5\' 6" )   Wt 44 kg   SpO2 100%   BMI 15.66 kg/m  Physical Exam Vitals and nursing note reviewed.  Constitutional:      General: She is not in acute distress.    Appearance: Normal appearance. She is well-developed. She is not toxic-appearing.  HENT:     Head: Normocephalic and atraumatic.  Eyes:     General: Lids are normal.     Conjunctiva/sclera: Conjunctivae normal.     Pupils: Pupils are equal, round, and reactive to light.  Neck:     Thyroid: No thyroid mass.     Trachea: No tracheal deviation.  Cardiovascular:     Rate and Rhythm: Tachycardia present. Rhythm  irregular.     Heart sounds: Normal heart sounds. No murmur heard.    No gallop.  Pulmonary:     Effort: Pulmonary effort is normal. No respiratory distress.     Breath sounds: Normal breath sounds. No stridor. No decreased breath sounds, wheezing, rhonchi or rales.  Abdominal:     General: There is no distension.     Palpations: Abdomen is soft.     Tenderness: There is no abdominal tenderness. There is no rebound.  Musculoskeletal:        General: No tenderness. Normal range of motion.     Cervical back: Normal range of motion and neck supple.  Skin:    General: Skin is warm and dry.     Findings: No abrasion or rash.  Neurological:     General: No focal deficit present.     Mental Status: She is alert and oriented to person, place,  and time. Mental status is at baseline.     GCS: GCS eye subscore is 4. GCS verbal subscore is 5. GCS motor subscore is 6.     Cranial Nerves: Cranial nerves 2-12 are intact. No cranial nerve deficit.     Sensory: No sensory deficit.     Motor: Motor function is intact.  Psychiatric:        Attention and Perception: Attention normal.        Speech: Speech normal.        Behavior: Behavior normal.     ED Results / Procedures / Treatments   Labs (all labs ordered are listed, but only abnormal results are displayed) Labs Reviewed  CBC  MAGNESIUM  COMPREHENSIVE METABOLIC PANEL    EKG EKG Interpretation  Date/Time:  Tuesday December 22 2021 13:26:13 EDT Ventricular Rate:  135 PR Interval:    QRS Duration: 76 QT Interval:  314 QTC Calculation: 471 R Axis:   33 Text Interpretation: Supraventricular tachycardia with frequent Premature ventricular complexes Anteroseptal infarct , age undetermined Marked ST abnormality, possible inferior subendocardial injury Abnormal ECG When compared with ECG of 25-Oct-2021 06:38, PREVIOUS ECG IS PRESENT Confirmed by Lorre Nick ([id number]) on 12/22/2021 2:12:53 PM  Radiology No results found.  Procedures Procedures    Medications Ordered in ED Medications  lactated ringers infusion (has no administration in time range)  metoprolol tartrate (LOPRESSOR) injection 5 mg (has no administration in time range)    ED Course/ Medical Decision Making/ A&P                           Medical Decision Making Amount and/or Complexity of Data Reviewed Labs: ordered.  Risk Prescription drug management.   Patient presented with increased heart rate.  Work-up is pending at this time.  Care signed out to next provider        Final Clinical Impression(s) / ED Diagnoses Final diagnoses:  None    Rx / DC Orders ED Discharge Orders     None         Lorre Nick, MD 12/27/21 1643

## 2021-12-22 NOTE — ED Provider Notes (Signed)
  Physical Exam  BP (!) 156/77   Pulse (!) 104   Temp 98.4 F (36.9 C)   Resp 19   Ht 5\' 6"  (1.676 m)   Wt 44 kg   SpO2 100%   BMI 15.66 kg/m   Physical Exam  Procedures  Procedures  ED Course / MDM    Medical Decision Making Amount and/or Complexity of Data Reviewed Labs: ordered.  Risk Prescription drug management.   Received care of pt from Dr. . Please see his note for prior hx, physical and care. Briefly this is an 81 yo female with history of htn, hlpd, mitral valve repair, MAZE, paroxysmal atrial fibrillation on xarelto, frequent PVCs on sotalol, who was sent to ED after high heart rate found incidentally on home health evaluation. Denies other symptoms. NO sign of acute infection, ACS.  EKG difficult to interpret but discussed with Cardiology and feel likely atrial flutter. Improved with metoprolol.  She has not been taking medications and this likely has contributed to worsened rate control.  Recommend taking medications as r and following up closely with Cardiology.         96, MD 12/26/21 [phone]

## 2021-12-23 MED ORDER — ALPRAZOLAM 0.25 MG PO TABS
0.2500 mg | ORAL_TABLET | Freq: Every evening | ORAL | Status: DC | PRN
  Administered 2021-12-23: 0.25 mg via ORAL
  Filled 2021-12-23: qty 1

## 2021-12-23 MED ORDER — RIVAROXABAN 15 MG PO TABS: 15.0000 mg | ORAL_TABLET | Freq: Every day | ORAL | 0 refills | Status: AC

## 2021-12-23 MED ORDER — SOTALOL HCL 120 MG PO TABS: 120.0000 mg | ORAL_TABLET | Freq: Every day | ORAL | Status: DC

## 2021-12-23 NOTE — ED Provider Notes (Signed)
Urgent referral and message sent to her cardiologist Dr. Gwynneth Macleod for close follow-up.  Anticoagulation has already been sent to her pharmacy.  Patient otherwise stable   Zadie Rhine, MD 12/23/21 [phone]

## 2021-12-23 NOTE — ED Notes (Signed)
Angela (daughter) called, no answer.  

## 2021-12-23 NOTE — ED Notes (Addendum)
This RN was informed during handoff that this pt was awaiting provider reassessment for discharge - This RN was then later informed that pt was up for discharge by oncoming shift - This RN tried to call patient's daughter - no answer at this time - Awaiting daughter answer for patient to be transported home

## 2021-12-23 NOTE — ED Notes (Signed)
Marylene Land (daughter) called, no answer.

## 2021-12-23 NOTE — ED Notes (Signed)
Marylene Land (daughter) called and no answer. Voice message left.

## 2021-12-23 NOTE — ED Notes (Signed)
Called daughter Marylene Land, stated will be here in .

## 2022-01-22 ENCOUNTER — Ambulatory Visit: Payer: PPO | Admitting: Cardiology

## 2022-02-12 ENCOUNTER — Other Ambulatory Visit: Payer: Self-pay | Admitting: Cardiology

## 2022-02-12 ENCOUNTER — Other Ambulatory Visit: Payer: Self-pay | Admitting: Internal Medicine

## 2022-02-12 DIAGNOSIS — K219 Gastro-esophageal reflux disease without esophagitis: Secondary | ICD-10-CM

## 2022-02-12 MED ORDER — SOTALOL HCL 80 MG PO TABS: 80.0000 mg | ORAL_TABLET | Freq: Two times a day (BID) | ORAL | 3 refills | Status: DC

## 2022-02-12 MED ORDER — RIVAROXABAN 15 MG PO TABS: 15.0000 mg | ORAL_TABLET | Freq: Every day | ORAL | 0 refills | Status: DC

## 2022-05-06 ENCOUNTER — Other Ambulatory Visit: Payer: Self-pay | Admitting: Internal Medicine

## 2022-05-06 NOTE — Telephone Encounter (Signed)
Can I refill?

## 2022-05-12 DIAGNOSIS — M791 Myalgia, unspecified site: Secondary | ICD-10-CM | POA: Diagnosis not present

## 2022-05-12 DIAGNOSIS — I110 Hypertensive heart disease with heart failure: Secondary | ICD-10-CM | POA: Diagnosis not present

## 2022-05-12 DIAGNOSIS — I700 Atherosclerosis of aorta: Secondary | ICD-10-CM | POA: Diagnosis not present

## 2022-07-29 ENCOUNTER — Other Ambulatory Visit: Payer: Self-pay

## 2022-07-29 ENCOUNTER — Emergency Department (HOSPITAL_COMMUNITY)
Admission: EM | Admit: 2022-07-29 | Discharge: 2022-07-30 | Disposition: A | Discharge status: Home / Self Care | Payer: PPO | Attending: Emergency Medicine | Admitting: Emergency Medicine

## 2022-07-29 ENCOUNTER — Encounter (HOSPITAL_COMMUNITY): Payer: Self-pay

## 2022-07-29 DIAGNOSIS — Z7901 Long term (current) use of anticoagulants: Secondary | ICD-10-CM | POA: Insufficient documentation

## 2022-07-29 DIAGNOSIS — K5900 Constipation, unspecified: Secondary | ICD-10-CM | POA: Diagnosis not present

## 2022-07-29 MED ORDER — POLYETHYLENE GLYCOL 3350 17 G PO PACK
17.0000 g | PACK | Freq: Every day | ORAL | Status: DC
  Administered 2022-07-29 – 2022-07-30 (×2): 17 g via ORAL
  Filled 2022-07-29 (×2): qty 1

## 2022-07-29 MED ORDER — MINERAL OIL RE ENEM
1.0000 | ENEMA | Freq: Once | RECTAL | Status: AC
  Administered 2022-07-29: 1 via RECTAL
  Filled 2022-07-29: qty 1

## 2022-07-29 MED ORDER — ACETAMINOPHEN 325 MG PO TABS
650.0000 mg | ORAL_TABLET | Freq: Once | ORAL | Status: AC
  Administered 2022-07-30: 650 mg via ORAL
  Filled 2022-07-29: qty 2

## 2022-07-29 MED ORDER — FLEET ENEMA 7-19 GM/118ML RE ENEM
1.0000 | ENEMA | Freq: Once | RECTAL | Status: AC
  Administered 2022-07-29: 1 via RECTAL
  Filled 2022-07-29: qty 1

## 2022-07-29 MED ORDER — POLYETHYLENE GLYCOL 3350 17 G PO PACK
17.0000 g | PACK | Freq: Every day | ORAL | 0 refills | Status: AC | PRN
Start: 2022-07-29 — End: ?

## 2022-07-29 NOTE — ED Notes (Addendum)
Pt stated her daughter said she's "not going to pick her up when she goes to the hospital".

## 2022-07-29 NOTE — ED Provider Notes (Signed)
[facility] Provider Note   CSN: [id number] Arrival date & time: 07/29/22  1343     History  Chief Complaint  Patient presents with   Constipation    [name] [name] is a 82 y.o. female.  This is a 82 year old female who complains of constipation for the past 5 days.  Patient symptoms have been unrelieved with over-the-counter medications.  No fever, emesis.  No abdominal discomfort.  Called EMS and transported here for further evaluation       Home Medications Prior to Admission medications   Medication Sig Start Date End Date Taking? Authorizing Provider  acetaminophen (TYLENOL) 325 MG tablet Take 2 tablets (650 mg total) by mouth every 6 (six) hours as needed for mild pain (or Fever >/= 101). 10/26/21   Danford, Suann Larry, MD  ALPRAZolam Duanne Moron) 0.25 MG tablet Take 0.25 mg by mouth at bedtime as needed for sleep or anxiety. 11/25/21   [provider]  famotidine (PEPCID) 40 MG tablet TAKE 1 TABLET BY MOUTH EVERY DAY 02/12/22   Adrian Prows, MD  feeding supplement (ENSURE ENLIVE / ENSURE PLUS) LIQD Take 237 mLs by mouth 2 (two) times daily between meals. 10/26/21   Danford, Suann Larry, MD  Multiple Vitamin (MULTIVITAMIN WITH MINERALS) TABS tablet Take 1 tablet by mouth daily. 04/26/19   Alma Friendly, MD  polyethylene glycol (MIRALAX) 17 g packet Take 17 g by mouth daily. Patient taking differently: Take 17 g by mouth daily as needed (constipation). 06/23/19   Rancour, Annie Main, MD  Rivaroxaban (XARELTO) 15 MG TABS tablet Take 1 tablet (15 mg total) by mouth daily with supper. Pt. Needs to make an appt .with Dr. Curt Bears in order to receive future refills. Thank You. 2nd Attempt. 12/23/21   Gareth Morgan, MD  Rivaroxaban (XARELTO) 15 MG TABS tablet TAKE 1 TABLET (15 MG TOTAL) BY MOUTH DAILY WITH SUPPER 05/06/22   Custovic, Collene Mares, DO  sotalol (BETAPACE) 80 MG tablet TAKE 1 TABLET BY MOUTH 2 TIMES DAILY. 05/06/22    Custovic, Collene Mares, DO      Allergies    Amiodarone, Diltiazem, Macrobid [nitrofurantoin monohyd macro], Mexiletine, Aspirin, Naproxen sodium, and Sulfa drugs cross reactors    Review of Systems   Review of Systems  All other systems reviewed and are negative.   Physical Exam Updated Vital Signs Ht 1.676 m (5\' 6" )   Wt 45 kg   BMI 16.01 kg/m  Physical Exam Vitals and nursing note reviewed. Exam conducted with a chaperone present.  Constitutional:      General: She is not in acute distress.    Appearance: Normal appearance. She is well-developed. She is not toxic-appearing.  HENT:     Head: Normocephalic and atraumatic.  Eyes:     General: Lids are normal.     Conjunctiva/sclera: Conjunctivae normal.     Pupils: Pupils are equal, round, and reactive to light.  Neck:     Thyroid: No thyroid mass.     Trachea: No tracheal deviation.  Cardiovascular:     Rate and Rhythm: Normal rate and regular rhythm.     Heart sounds: Normal heart sounds. No murmur heard.    No gallop.  Pulmonary:     Effort: Pulmonary effort is normal. No respiratory distress.     Breath sounds: Normal breath sounds. No stridor. No decreased breath sounds, wheezing, rhonchi or rales.  Abdominal:     General: There is no distension.     Palpations:  Abdomen is soft.     Tenderness: There is no abdominal tenderness. There is no rebound.  Genitourinary:    Comments: Hard stool noted in rectal vault Musculoskeletal:        General: No tenderness. Normal range of motion.     Cervical back: Normal range of motion and neck supple.  Skin:    General: Skin is warm and dry.     Findings: No abrasion or rash.  Neurological:     Mental Status: She is alert and oriented to person, place, and time. Mental status is at baseline.     GCS: GCS eye subscore is 4. GCS verbal subscore is 5. GCS motor subscore is 6.     Cranial Nerves: No cranial nerve deficit.     Sensory: No sensory deficit.     Motor: Motor function  is intact.  Psychiatric:        Attention and Perception: Attention normal.        Speech: Speech normal.        Behavior: Behavior normal.     ED Results / Procedures / Treatments   Labs (all labs ordered are listed, but only abnormal results are displayed) Labs Reviewed - No data to display  EKG None  Radiology No results found.  Procedures Procedures    Medications Ordered in ED Medications  sodium phosphate (FLEET) 7-19 GM/118ML enema 1 enema (has no administration in time range)    ED Course/ Medical Decision Making/ A&P                             Medical Decision Making Risk OTC drugs.   Patient given fleets enema with minimal response.  Subsequently given a mineral oil enema pending at this time.  No concern for obstruction.  I do not think that she needs imaging at this time.  Care turned over to Dr. Langston Masker        Final Clinical Impression(s) / ED Diagnoses Final diagnoses:  None    Rx / DC Orders ED Discharge Orders     None         Lacretia Leigh, MD 07/29/22 1622

## 2022-07-29 NOTE — ED Triage Notes (Signed)
Patient BIB [facility] from home. Constipation for 5 days. Tried taking stool softeners and Magnesium without relief. Has rectal pain.

## 2022-07-29 NOTE — ED Notes (Signed)
Patient attempted to use the bathroom. Medium amount came out with assistance. Stool brown soft and hard.

## 2022-07-29 NOTE — ED Notes (Signed)
Contacted the patient family for transportation they are going to call back.

## 2022-07-29 NOTE — ED Provider Notes (Signed)
82 year old female here with constipation for 5 days.  Seen by earlier provider performed a rectal disimpaction, was able to remove a small amount of stool but there is no large obstruction per or report.  Patient was given enema and has not produced a bowel movement.  We will try MiraLAX.  No evidence of bowel obstruction or concern for acute obstructive process.  This appears to be functional constipation.   Physical Exam  BP (!) 160/65   Pulse 62   Temp 97.7 F (36.5 C) (Oral)   Resp 16   Ht 5\' 6"  (1.676 m)   Wt 45 kg   SpO2 95%   BMI 16.01 kg/m   Physical Exam  Procedures  Procedures  ED Course / MDM    Medical Decision Making Risk OTC drugs.   730 pm-patient had a bowel movement.  She is stable for discharge.  MiraLAX prescribed.  Okay for discharge       Wyvonnia Dusky, MD 07/29/22 [phone]

## 2022-07-29 NOTE — ED Notes (Signed)
Attempted to call pt daughter back twice with no answer.

## 2022-07-30 MED ORDER — SOTALOL HCL 80 MG PO TABS: 80.0000 mg | ORAL_TABLET | Freq: Two times a day (BID) | ORAL | Status: DC

## 2022-07-30 MED ORDER — ENSURE ENLIVE PO LIQD
237.0000 mL | Freq: Two times a day (BID) | ORAL | Status: DC
  Filled 2022-07-30 (×2): qty 237

## 2022-07-30 MED ORDER — POLYETHYLENE GLYCOL 3350 17 G PO PACK
17.0000 g | PACK | Freq: Every day | ORAL | Status: DC
  Administered 2022-07-30: 17 g via ORAL
  Filled 2022-07-30: qty 1

## 2022-07-30 MED ORDER — RIVAROXABAN 15 MG PO TABS
15.0000 mg | ORAL_TABLET | Freq: Every day | ORAL | Status: DC
Start: 2022-07-30 — End: 2022-07-30

## 2022-07-30 MED ORDER — ACETAMINOPHEN 325 MG PO TABS
650.0000 mg | ORAL_TABLET | Freq: Once | ORAL | Status: AC
  Administered 2022-07-30: 650 mg via ORAL
  Filled 2022-07-30: qty 2

## 2022-07-30 MED ORDER — LIDOCAINE 5 % EX PTCH
1.0000 | MEDICATED_PATCH | Freq: Once | CUTANEOUS | Status: DC
  Administered 2022-07-30: 1 via TRANSDERMAL
  Filled 2022-07-30: qty 1

## 2022-07-30 MED ORDER — FAMOTIDINE 20 MG PO TABS
40.0000 mg | ORAL_TABLET | Freq: Every day | ORAL | Status: DC
  Administered 2022-07-30: 40 mg via ORAL
  Filled 2022-07-30: qty 2

## 2022-07-30 MED ORDER — ADULT MULTIVITAMIN W/MINERALS CH
1.0000 | ORAL_TABLET | Freq: Every day | ORAL | Status: DC
  Administered 2022-07-30: 1 via ORAL
  Filled 2022-07-30: qty 1

## 2022-07-30 MED ORDER — ALPRAZOLAM 0.25 MG PO TABS
0.2500 mg | ORAL_TABLET | Freq: Every evening | ORAL | Status: DC | PRN
Start: 2022-07-30 — End: 2022-07-30
  Administered 2022-07-30: 0.25 mg via ORAL
  Filled 2022-07-30: qty 1

## 2022-07-30 MED ORDER — ACETAMINOPHEN 325 MG PO TABS
650.0000 mg | ORAL_TABLET | Freq: Four times a day (QID) | ORAL | Status: DC | PRN
Start: 2022-07-30 — End: 2022-07-30
  Administered 2022-07-30: 650 mg via ORAL
  Filled 2022-07-30: qty 2

## 2022-07-30 NOTE — ED Notes (Signed)
Patient son called and informed receptionist that he was here to transport his mother. Patient wheeled to lobby in w/c with D/C paperwork and personal belongings.

## 2022-07-30 NOTE — ED Notes (Addendum)
Patient called out states she is agitated/ crying out and having SOB. She denies chest tightness or difficulty breathing. She asked RN to leave room door open and to call her grandson to come get her, "I'm ready to go," patient voiced. RN repositioned patient she states that was better she c/o being cold, RN provided warming blanket and gave PRN meds.

## 2022-07-30 NOTE — ED Notes (Signed)
RN called Dannielle Huh (son) and stated "somebody needs to come get her". He stated "he is [facility] getting [facility] procedure and said he will try to come around 10-11am".

## 2022-07-30 NOTE — ED Notes (Signed)
Breakfast sandwich and coffee given to patient

## 2022-08-04 ENCOUNTER — Telehealth: Payer: Self-pay

## 2022-08-04 NOTE — Telephone Encounter (Signed)
        Patient  visited [facility] on 45     Telephone encounter attempt :  2nd  A HIPAA compliant voice message was left requesting a return call.  Instructed patient to call back     Layana Konkel [facility],  [phone] [address], [place], [place] [address] Phone: [phone] Email: [email or web address]       

## 2022-08-04 NOTE — Telephone Encounter (Signed)
        Patient  visited [facility]  on 4/5    Telephone encounter attempt :  1st  A HIPAA compliant voice message was left requesting a return call.  Instructed patient to call back     [name] [name] [facility], [place] [phone] [address], [place], [place] [address] Phone: [phone] Email: [email or web address]       

## 2022-09-28 ENCOUNTER — Other Ambulatory Visit: Payer: Self-pay | Admitting: Internal Medicine

## 2022-11-10 ENCOUNTER — Other Ambulatory Visit: Payer: Self-pay | Admitting: Internal Medicine

## 2022-12-22 ENCOUNTER — Emergency Department (HOSPITAL_COMMUNITY)
Admission: EM | Admit: 2022-12-22 | Discharge: 2022-12-22 | Disposition: A | Discharge status: Home / Self Care | Payer: PPO | Attending: Emergency Medicine | Admitting: Emergency Medicine

## 2022-12-22 ENCOUNTER — Other Ambulatory Visit: Payer: Self-pay

## 2022-12-22 ENCOUNTER — Emergency Department (HOSPITAL_COMMUNITY): Payer: PPO

## 2022-12-22 DIAGNOSIS — R627 Adult failure to thrive: Secondary | ICD-10-CM | POA: Diagnosis not present

## 2022-12-22 DIAGNOSIS — Z20822 Contact with and (suspected) exposure to covid-19: Secondary | ICD-10-CM | POA: Diagnosis not present

## 2022-12-22 DIAGNOSIS — R059 Cough, unspecified: Secondary | ICD-10-CM | POA: Diagnosis not present

## 2022-12-22 DIAGNOSIS — R531 Weakness: Secondary | ICD-10-CM | POA: Insufficient documentation

## 2022-12-22 DIAGNOSIS — E049 Nontoxic goiter, unspecified: Secondary | ICD-10-CM | POA: Diagnosis not present

## 2022-12-22 DIAGNOSIS — R911 Solitary pulmonary nodule: Secondary | ICD-10-CM | POA: Diagnosis not present

## 2022-12-22 DIAGNOSIS — R110 Nausea: Secondary | ICD-10-CM | POA: Diagnosis not present

## 2022-12-22 DIAGNOSIS — F419 Anxiety disorder, unspecified: Secondary | ICD-10-CM | POA: Diagnosis not present

## 2022-12-22 DIAGNOSIS — Z7901 Long term (current) use of anticoagulants: Secondary | ICD-10-CM | POA: Diagnosis not present

## 2022-12-22 LAB — CBC WITH DIFFERENTIAL/PLATELET
Abs Immature Granulocytes: 0.05 10*3/uL (ref 0.00–0.07)
Basophils Absolute: 0 10*3/uL (ref 0.0–0.1)
Basophils Relative: 0 %
Eosinophils Absolute: 0 10*3/uL (ref 0.0–0.5)
Eosinophils Relative: 0 %
HCT: 34 % — ABNORMAL LOW (ref 36.0–46.0)
Hemoglobin: 10.9 g/dL — ABNORMAL LOW (ref 12.0–15.0)
Immature Granulocytes: 0 %
Lymphocytes Relative: 15 %
Lymphs Abs: 1.8 10*3/uL (ref 0.7–4.0)
MCH: 29.5 pg (ref 26.0–34.0)
MCHC: 32.1 g/dL (ref 30.0–36.0)
MCV: 91.9 fL (ref 80.0–100.0)
Monocytes Absolute: 0.9 10*3/uL (ref 0.1–1.0)
Monocytes Relative: 7 %
Neutro Abs: 9.6 10*3/uL — ABNORMAL HIGH (ref 1.7–7.7)
Neutrophils Relative %: 78 %
Platelets: 295 10*3/uL (ref 150–400)
RBC: 3.7 MIL/uL — ABNORMAL LOW (ref 3.87–5.11)
RDW: 14.6 % (ref 11.5–15.5)
WBC: 12.5 10*3/uL — ABNORMAL HIGH (ref 4.0–10.5)
nRBC: 0 % (ref 0.0–0.2)

## 2022-12-22 LAB — URINALYSIS, ROUTINE W REFLEX MICROSCOPIC
Bacteria, UA: NONE SEEN
Bilirubin Urine: NEGATIVE
Glucose, UA: NEGATIVE mg/dL
Ketones, ur: NEGATIVE mg/dL
Nitrite: NEGATIVE
Protein, ur: NEGATIVE mg/dL
Specific Gravity, Urine: 1.011 (ref 1.005–1.030)
pH: 7 (ref 5.0–8.0)

## 2022-12-22 LAB — RESP PANEL BY RT-PCR (RSV, FLU A&B, COVID)  RVPGX2
Influenza A by PCR: NEGATIVE
Influenza B by PCR: NEGATIVE
Resp Syncytial Virus by PCR: NEGATIVE
SARS Coronavirus 2 by RT PCR: NEGATIVE

## 2022-12-22 LAB — COMPREHENSIVE METABOLIC PANEL
ALT: 31 U/L (ref 0–44)
AST: 45 U/L — ABNORMAL HIGH (ref 15–41)
Albumin: 3.3 g/dL — ABNORMAL LOW (ref 3.5–5.0)
Alkaline Phosphatase: 89 U/L (ref 38–126)
Anion gap: 12 (ref 5–15)
BUN: 15 mg/dL (ref 8–23)
CO2: 20 mmol/L — ABNORMAL LOW (ref 22–32)
Calcium: 9.3 mg/dL (ref 8.9–10.3)
Chloride: 100 mmol/L (ref 98–111)
Creatinine, Ser: 0.79 mg/dL (ref 0.44–1.00)
GFR, Estimated: >60 mL/min (ref >60)
Glucose, Bld: 98 mg/dL (ref 70–99)
Potassium: 4.2 mmol/L (ref 3.5–5.1)
Sodium: 132 mmol/L — ABNORMAL LOW (ref 135–145)
Total Bilirubin: 1 mg/dL (ref 0.3–1.2)
Total Protein: 6.8 g/dL (ref 6.5–8.1)

## 2022-12-22 NOTE — ED Provider Notes (Signed)
[place] EMERGENCY DEPARTMENT AT [facility] Provider Note   CSN: [id number] Arrival date & time: 12/22/22  0554     History  Chief Complaint  Patient presents with   Failure To Thrive    [name] [name] is a 82 y.o. female.  82 year old female with possible medical history as detailed below presents for evaluation.  Patient presents with EMS from home.  Patient reports that she had some cramps in her legs and felt anxious about this.  She decided to call EMS for evaluation.  Currently she is comfortable.  She is without complaint.  She reports that she was in her normal state of health until this morning.  Patient reports that she was getting ready for her day she had some crampy pain in her legs.  This lasted approximately 5 minutes.  Symptoms passed quickly.  She did not take anything for the same.  She denies associated chest pain, shortness of breath, nausea, vomiting, other complaint.  She reports that the cramps are so bad she felt "weak all over".    The history is provided by the patient and medical records.       Home Medications Prior to Admission medications   Medication Sig Start Date End Date Taking? Authorizing Provider  acetaminophen (TYLENOL) 325 MG tablet Take 2 tablets (650 mg total) by mouth every 6 (six) hours as needed for mild pain (or Fever >/= 101). 10/26/21   Danford, Earl Lites, MD  ALPRAZolam Prudy Feeler) 0.25 MG tablet Take 0.25 mg by mouth at bedtime as needed for sleep or anxiety. 11/25/21   [provider]  famotidine (PEPCID) 40 MG tablet TAKE 1 TABLET BY MOUTH EVERY DAY 02/12/22   Yates Decamp, MD  feeding supplement (ENSURE ENLIVE / ENSURE PLUS) LIQD Take 237 mLs by mouth 2 (two) times daily between meals. 10/26/21   Danford, Earl Lites, MD  Multiple Vitamin (MULTIVITAMIN WITH MINERALS) TABS tablet Take 1 tablet by mouth daily. 04/26/19   Briant Cedar, MD  polyethylene glycol (MIRALAX) 17 g packet Take 17 g by mouth  daily. Patient taking differently: Take 17 g by mouth daily as needed (constipation). 06/23/19   Rancour, Jeannett Senior, MD  polyethylene glycol (MIRALAX) 17 g packet Take 17 g by mouth daily as needed for up to 10 doses for mild constipation or moderate constipation. 07/29/22   Terald Sleeper, MD  Rivaroxaban (XARELTO) 15 MG TABS tablet Take 1 tablet (15 mg total) by mouth daily with supper. Pt. Needs to make an appt .with Dr. Elberta Fortis in order to receive future refills. Thank You. 2nd Attempt. 12/23/21   Alvira Monday, MD  sotalol (BETAPACE) 80 MG tablet TAKE 1 TABLET BY MOUTH 2 TIMES DAILY. 05/06/22   Custovic, Rozell Searing, DO  XARELTO 15 MG TABS tablet TAKE 1 TABLET (15 MG TOTAL) BY MOUTH DAILY WITH SUPPER 09/28/22   Custovic, Rozell Searing, DO      Allergies    Amiodarone, Diltiazem, Macrobid [nitrofurantoin monohyd macro], Mexiletine, Aspirin, Naproxen sodium, and Sulfa drugs cross reactors    Review of Systems   Review of Systems  All other systems reviewed and are negative.   Physical Exam Updated Vital Signs BP (!) 140/63   Pulse 98   Temp 98.3 F (36.8 C) (Oral)   Resp 17   SpO2 99%  Physical Exam Vitals and nursing note reviewed.  Constitutional:      General: She is not in acute distress.    Appearance: Normal appearance. She is well-developed.  HENT:     Head: Normocephalic and atraumatic.  Eyes:     Conjunctiva/sclera: Conjunctivae normal.     Pupils: Pupils are equal, round, and reactive to light.  Cardiovascular:     Rate and Rhythm: Normal rate and regular rhythm.     Heart sounds: Normal heart sounds.  Pulmonary:     Effort: Pulmonary effort is normal. No respiratory distress.     Breath sounds: Normal breath sounds.  Abdominal:     General: There is no distension.     Palpations: Abdomen is soft.     Tenderness: There is no abdominal tenderness.  Musculoskeletal:        General: No deformity. Normal range of motion.     Cervical back: Normal range of motion and neck  supple.  Skin:    General: Skin is warm and dry.  Neurological:     General: No focal deficit present.     Mental Status: She is alert and oriented to person, place, and time.     ED Results / Procedures / Treatments   Labs (all labs ordered are listed, but only abnormal results are displayed) Labs Reviewed  COMPREHENSIVE METABOLIC PANEL - Abnormal; Notable for the following components:      Result Value   Sodium 132 (*)    CO2 20 (*)    Albumin 3.3 (*)    AST 45 (*)    All other components within normal limits  URINALYSIS, ROUTINE W REFLEX MICROSCOPIC - Abnormal; Notable for the following components:   Hgb urine dipstick MODERATE (*)    Leukocytes,Ua TRACE (*)    All other components within normal limits  CBC WITH DIFFERENTIAL/PLATELET - Abnormal; Notable for the following components:   WBC 12.5 (*)    RBC 3.70 (*)    Hemoglobin 10.9 (*)    HCT 34.0 (*)    Neutro Abs 9.6 (*)    All other components within normal limits  RESP PANEL BY RT-PCR (RSV, FLU A&B, COVID)  [id number]  CBC WITH DIFFERENTIAL/PLATELET    EKG None  Radiology DG Chest 2 View  Result Date: 12/22/2022 CLINICAL DATA:  Cough.  Anxiety. EXAM: CHEST - 2 VIEW COMPARISON:  12/22/2021 FINDINGS: Mild cardiac enlargement. Aortic atherosclerosis. Leftward deviation of the tracheal air column appears similar to previous exam and is favored to represent presence of underlying thyroid goiter. No pleural fluid or interstitial edema identified. No airspace opacities. There is a new small nodule identified within the left midlung which measures 8 mm. Visualized osseous structures appear intact. IMPRESSION: 1. No acute cardiopulmonary abnormalities. 2. New 8 mm nodule within the left midlung. Recommend further evaluation with nonemergent, noncontrast chest CT. 3. Leftward deviation of the tracheal air column is favored to represent presence of underlying thyroid goiter. 4.  Aortic Atherosclerosis (ICD10-I70.0). Electronically  Signed   By: Signa Kell M.D.   On: 12/22/2022 09:53    Procedures Procedures    Medications Ordered in ED Medications - No data to display  ED Course/ Medical Decision Making/ A&P                                 Medical Decision Making Amount and/or Complexity of Data Reviewed Labs: ordered. Radiology: ordered.    Medical Screen Complete  This patient presented to the ED with complaint of leg cramps.  This complaint involves an extensive number of treatment options. The initial differential diagnosis includes,  but is not limited to, metabolic abnormality, etc.  This presentation is: Acute, Chronic, Self-Limited, Previously Undiagnosed, Uncertain Prognosis, Complicated, and Systemic Symptoms  Patient presents with anxiety after experiencing brief self resolved leg cramps.  Workup on the whole is without evidence of significant acute abnormality.  Patient feels significant improved after ED evaluation.  She desires discharge home.  She declines social work evaluation or consultation.  She is appropriate during our interaction.  She appears to have capacity to make her own medical decisions.  Importance of close follow-up is stressed.  Strict return precautions given and understood.  Additional history obtained: External records from outside sources obtained and reviewed including prior ED visits and prior Inpatient records.   Problem List / ED Course:  Leg cramps Georgeann Oppenheim   Reevaluation:  After the interventions noted above, I reevaluated the patient and found that they have: resolved   Disposition:  After consideration of the diagnostic results and the patients response to treatment, I feel that the patent would benefit from outpatient follow-up.          Final Clinical Impression(s) / ED Diagnoses Final diagnoses:  Weakness    Rx / DC Orders ED Discharge Orders     None         Wynetta Fines, MD 12/22/22 1600

## 2022-12-22 NOTE — ED Triage Notes (Signed)
Patient BIB EMS for evaluation of possible anxiety.  Lives at home by herself and has very little help from family.  Per EMS patient called for help "when she couldn't get her teeth back in."  EMS concerned that patient is unable to take care of herself.  Had a loaf of bread and lunch meat.  Pt stated that family will not answer their phones "because they are tired of her calling."  Recent visits had multiple notes about family not answering and was hard to get transportation of d/c.  Needing resources.  Patient unsafe to return home.  Having trouble taking and getting her medications.  Possible memory issues.

## 2022-12-22 NOTE — ED Notes (Signed)
[facility] called  

## 2022-12-22 NOTE — ED Notes (Signed)
Spoke with patient daughter. Advised patient will be heading home right now with [facility].verbalized understanding.

## 2022-12-22 NOTE — Discharge Instructions (Signed)
Return for any problem.  ?

## 2023-01-04 ENCOUNTER — Telehealth: Payer: Self-pay | Admitting: *Deleted

## 2023-01-06 ENCOUNTER — Telehealth: Payer: Self-pay | Admitting: *Deleted

## 2023-01-06 NOTE — Telephone Encounter (Signed)
Transition Care Management Unsuccessful Follow-up Telephone Call  Date of discharge and from where:  [facility]  12/22/2022  Attempts:  2nd Attempt  Reason for unsuccessful TCM follow-up call:  No answer/busy

## 2023-02-01 DIAGNOSIS — D692 Other nonthrombocytopenic purpura: Secondary | ICD-10-CM | POA: Diagnosis not present

## 2023-02-01 DIAGNOSIS — I509 Heart failure, unspecified: Secondary | ICD-10-CM | POA: Diagnosis not present

## 2023-02-01 DIAGNOSIS — I110 Hypertensive heart disease with heart failure: Secondary | ICD-10-CM | POA: Diagnosis not present

## 2023-02-01 DIAGNOSIS — I4819 Other persistent atrial fibrillation: Secondary | ICD-10-CM | POA: Diagnosis not present

## 2023-03-21 DIAGNOSIS — I2510 Atherosclerotic heart disease of native coronary artery without angina pectoris: Secondary | ICD-10-CM | POA: Diagnosis not present

## 2023-03-21 DIAGNOSIS — Z7901 Long term (current) use of anticoagulants: Secondary | ICD-10-CM | POA: Diagnosis not present

## 2023-03-21 DIAGNOSIS — I480 Paroxysmal atrial fibrillation: Secondary | ICD-10-CM | POA: Diagnosis not present

## 2023-03-21 DIAGNOSIS — R109 Unspecified abdominal pain: Secondary | ICD-10-CM | POA: Diagnosis not present

## 2023-03-21 DIAGNOSIS — I5020 Unspecified systolic (congestive) heart failure: Secondary | ICD-10-CM | POA: Diagnosis not present

## 2023-03-21 DIAGNOSIS — R634 Abnormal weight loss: Secondary | ICD-10-CM | POA: Diagnosis not present

## 2023-03-21 DIAGNOSIS — I110 Hypertensive heart disease with heart failure: Secondary | ICD-10-CM | POA: Diagnosis not present

## 2023-05-16 DIAGNOSIS — F329 Major depressive disorder, single episode, unspecified: Secondary | ICD-10-CM | POA: Diagnosis not present

## 2023-05-16 DIAGNOSIS — R634 Abnormal weight loss: Secondary | ICD-10-CM | POA: Diagnosis not present

## 2023-05-16 DIAGNOSIS — I2510 Atherosclerotic heart disease of native coronary artery without angina pectoris: Secondary | ICD-10-CM | POA: Diagnosis not present

## 2023-05-16 DIAGNOSIS — I700 Atherosclerosis of aorta: Secondary | ICD-10-CM | POA: Diagnosis not present

## 2023-05-16 DIAGNOSIS — I10 Essential (primary) hypertension: Secondary | ICD-10-CM | POA: Diagnosis not present

## 2023-05-16 DIAGNOSIS — I480 Paroxysmal atrial fibrillation: Secondary | ICD-10-CM | POA: Diagnosis not present

## 2023-06-16 ENCOUNTER — Other Ambulatory Visit: Payer: Self-pay | Admitting: Internal Medicine

## 2023-08-16 ENCOUNTER — Other Ambulatory Visit: Payer: Self-pay

## 2023-08-16 ENCOUNTER — Emergency Department (HOSPITAL_COMMUNITY)
Admission: EM | Admit: 2023-08-16 | Discharge: 2023-08-16 | Disposition: A | Discharge status: Home / Self Care | Attending: Emergency Medicine | Admitting: Emergency Medicine

## 2023-08-16 ENCOUNTER — Emergency Department (HOSPITAL_COMMUNITY)

## 2023-08-16 DIAGNOSIS — M4854XA Collapsed vertebra, not elsewhere classified, thoracic region, initial encounter for fracture: Secondary | ICD-10-CM | POA: Diagnosis not present

## 2023-08-16 DIAGNOSIS — I959 Hypotension, unspecified: Secondary | ICD-10-CM | POA: Diagnosis not present

## 2023-08-16 DIAGNOSIS — Z043 Encounter for examination and observation following other accident: Secondary | ICD-10-CM | POA: Diagnosis not present

## 2023-08-16 DIAGNOSIS — R000 Tachycardia, unspecified: Secondary | ICD-10-CM | POA: Diagnosis not present

## 2023-08-16 DIAGNOSIS — M545 Low back pain, unspecified: Secondary | ICD-10-CM | POA: Insufficient documentation

## 2023-08-16 DIAGNOSIS — M549 Dorsalgia, unspecified: Secondary | ICD-10-CM | POA: Diagnosis not present

## 2023-08-16 DIAGNOSIS — Z7901 Long term (current) use of anticoagulants: Secondary | ICD-10-CM | POA: Diagnosis not present

## 2023-08-16 DIAGNOSIS — M546 Pain in thoracic spine: Secondary | ICD-10-CM | POA: Diagnosis present

## 2023-08-16 DIAGNOSIS — S22070A Wedge compression fracture of T9-T10 vertebra, initial encounter for closed fracture: Secondary | ICD-10-CM | POA: Diagnosis not present

## 2023-08-16 DIAGNOSIS — M51369 Other intervertebral disc degeneration, lumbar region without mention of lumbar back pain or lower extremity pain: Secondary | ICD-10-CM | POA: Diagnosis not present

## 2023-08-16 DIAGNOSIS — R059 Cough, unspecified: Secondary | ICD-10-CM | POA: Diagnosis not present

## 2023-08-16 DIAGNOSIS — I499 Cardiac arrhythmia, unspecified: Secondary | ICD-10-CM | POA: Diagnosis not present

## 2023-08-16 DIAGNOSIS — M48061 Spinal stenosis, lumbar region without neurogenic claudication: Secondary | ICD-10-CM | POA: Diagnosis not present

## 2023-08-16 DIAGNOSIS — X500XXA Overexertion from strenuous movement or load, initial encounter: Secondary | ICD-10-CM | POA: Diagnosis not present

## 2023-08-16 DIAGNOSIS — M4856XA Collapsed vertebra, not elsewhere classified, lumbar region, initial encounter for fracture: Secondary | ICD-10-CM | POA: Diagnosis not present

## 2023-08-16 MED ORDER — LIDOCAINE 5 % EX PTCH
1.0000 | MEDICATED_PATCH | CUTANEOUS | Status: DC
  Administered 2023-08-16: 1 via TRANSDERMAL
  Filled 2023-08-16: qty 1

## 2023-08-16 MED ORDER — LIDOCAINE 5 % EX PTCH: 1.0000 | MEDICATED_PATCH | CUTANEOUS | 0 refills | Status: AC

## 2023-08-16 NOTE — ED Provider Notes (Signed)
 [place] EMERGENCY DEPARTMENT AT [facility] Provider Note   CSN: [id number] Arrival date & time: 08/16/23  5956     History  Chief Complaint  Patient presents with   Back Pain   Abnormal ECG    [name] [name] is a 83 y.o. female.  HPI Patient presents with back pain.  Patient states that she picked up a heavy chair yesterday and since that time has had pain in the right paraspinal region from the lower thoracic into the upper lumbar region.  No abdominal pain, no chest pain, no fall.  No relief with tramadol .  Patient has a history of fall 2 weeks ago, none since that time.  She has been in her usual state of health.    Home Medications Prior to Admission medications   Medication Sig Start Date End Date Taking? Authorizing Provider  acetaminophen  (TYLENOL ) 325 MG tablet Take 2 tablets (650 mg total) by mouth every 6 (six) hours as needed for mild pain (or Fever >/= 101). 10/26/21   Danford, Willis Harter, MD  ALPRAZolam  (XANAX ) 0.25 MG tablet Take 0.25 mg by mouth at bedtime as needed for sleep or anxiety. 11/25/21   [provider]  famotidine  (PEPCID ) 40 MG tablet TAKE 1 TABLET BY MOUTH EVERY DAY 02/12/22   Knox Perl, MD  feeding supplement (ENSURE ENLIVE / ENSURE PLUS) LIQD Take 237 mLs by mouth 2 (two) times daily between meals. 10/26/21   Danford, Willis Harter, MD  Multiple Vitamin (MULTIVITAMIN WITH MINERALS) TABS tablet Take 1 tablet by mouth daily. 04/26/19   Ezenduka, Nkeiruka J, MD  polyethylene glycol (MIRALAX ) 17 g packet Take 17 g by mouth daily. Patient taking differently: Take 17 g by mouth daily as needed (constipation). 06/23/19   Rancour, Mara Seminole, MD  polyethylene glycol (MIRALAX ) 17 g packet Take 17 g by mouth daily as needed for up to 10 doses for mild constipation or moderate constipation. 07/29/22   Arvilla Birmingham, MD  Rivaroxaban  (XARELTO ) 15 MG TABS tablet Take 1 tablet (15 mg total) by mouth daily with supper. Pt. Needs to make an appt  .with Dr. Lawana Pray in order to receive future refills. Thank You. 2nd Attempt. 12/23/21   Scarlette Currier, MD  sotalol  (BETAPACE ) 80 MG tablet TAKE 1 TABLET BY MOUTH 2 TIMES DAILY. 05/06/22   Custovic, Sabina, DO  XARELTO  15 MG TABS tablet TAKE 1 TABLET (15 MG TOTAL) BY MOUTH DAILY WITH SUPPER 09/28/22   Custovic, Sabina, DO      Allergies    Amiodarone , Diltiazem , Macrobid [nitrofurantoin monohyd macro], Mexiletine, Aspirin , Naproxen sodium, and Sulfa drugs cross reactors    Review of Systems   Review of Systems  Physical Exam Updated Vital Signs BP (!) 162/75 (BP Location: Right Arm)   Pulse 81   Temp (!) 97.4 F (36.3 C)   Resp (!) 28   SpO2 96%  Physical Exam Vitals and nursing note reviewed.  Constitutional:      General: She is not in acute distress.    Appearance: She is well-developed.  HENT:     Head: Normocephalic and atraumatic.  Eyes:     Conjunctiva/sclera: Conjunctivae normal.  Cardiovascular:     Rate and Rhythm: Normal rate and regular rhythm.  Pulmonary:     Effort: Pulmonary effort is normal. No respiratory distress.     Breath sounds: Normal breath sounds. No stridor.  Abdominal:     General: There is no distension.  Musculoskeletal:     Comments:  Patient flexes and extends her back minimally, spontaneously, describes pain with any twisting motion, however.   Skin:    General: Skin is warm and dry.  Neurological:     Mental Status: She is alert and oriented to person, place, and time.     Cranial Nerves: No cranial nerve deficit.  Psychiatric:        Mood and Affect: Mood normal.     ED Results / Procedures / Treatments   Labs (all labs ordered are listed, but only abnormal results are displayed) Labs Reviewed - No data to display  EKG EKG Interpretation Date/Time:  Tuesday August 16 2023 09:40:38 EDT Ventricular Rate:  94 PR Interval:  239 QRS Duration:  90 QT Interval:  370 QTC Calculation: 344 R Axis:   -69  Text Interpretation: Sinus  rhythm Ventricular bigeminy Prolonged PR interval Probable left atrial enlargement Left anterior fascicular block LVH with secondary repolarization abnormality Anterior infarct, old Confirmed by Dorenda Gandy [phone]) on 08/16/2023 10:21:11 AM  Radiology DG Thoracic Spine W/Swimmers Result Date: 08/16/2023 CLINICAL DATA:  Status post fall with back pain EXAM: THORACIC SPINE - 3 VIEWS COMPARISON:  None Available. FINDINGS: There is mild compression fracture deformity of the T10 vertebral body age of which is unclear. If clinically indicated consider follow-up MRI. There is no other bony abnormalities. IMPRESSION: Mild compression fracture deformity of the T10 vertebral body age of which is unclear. If clinically indicated consider follow-up MRI. Electronically Signed   By: Fredrich Jefferson M.D.   On: 08/16/2023 12:26   DG Lumbar Spine Complete Result Date: 08/16/2023 CLINICAL DATA:  Status post fall with paraspinal pain EXAM: LUMBAR SPINE - COMPLETE 4+ VIEW COMPARISON:  CT abdomen and pelvis June 23, 2019 FINDINGS: Old compression fracture deformity superior endplate of L3 related to Schmorl's nodule. Degenerative disc disease with narrowing of the L4 and L5 disc spaces without obvious fractures No spondylolysis or listhesis Degenerative facet disease L4-5 level. IMPRESSION: *Old compression fracture deformity superior endplate of L3 related to Schmorl's nodule. *Degenerative disc disease with narrowing of the L4 and L5 disc spaces without obvious fractures. Electronically Signed   By: Fredrich Jefferson M.D.   On: 08/16/2023 12:25    Procedures Procedures    Medications Ordered in ED Medications  lidocaine  (LIDODERM ) 5 % 1 patch (1 patch Transdermal Patch Applied 08/16/23 1020)    ED Course/ Medical Decision Making/ A&P                                 Medical Decision Making Patient with back pain since picking up a heavy object. Patient is awake, alert, neurologically unremarkable aside from  atrophy.  Patient does have bigeminy, but has no chest pain, no dyspnea, and in the past has had similar morphology of her cardiac strip. Absent abdominal pain, neurodeficits, lower suspicion for dissection, suspicion for musculoskeletal etiology x-rays ordered, analgesia ordered. Pulse ox 94% room air borderline Cardiac 80, with bigeminy, abnormal  Amount and/or Complexity of Data Reviewed External Data Reviewed: notes. Radiology: ordered and independent interpretation performed. Decision-making details documented in ED Course. ECG/medicine tests: independent interpretation performed. Decision-making details documented in ED Course.  Risk Prescription drug management. Decision regarding hospitalization. Diagnosis or treatment significantly limited by social determinants of health.   1:01 PM Patient in no distress, awake, alert, speaking clearly, she is not tachypneic, not hypoxic, heart rate normal, and I reviewed the x-rays discussed them with  her.  Patient with findings consistent with small compression fracture T10 which is the area of pain, no evidence for cauda equina or other neurodeficits.  Patient discharged with diclofenac topical meds, Tylenol , outpatient follow-up.        Final Clinical Impression(s) / ED Diagnoses Final diagnoses:  Closed wedge compression fracture of T10 vertebra, initial encounter (HCC)     Dorenda Gandy, MD 08/16/23 1302

## 2023-08-16 NOTE — Discharge Instructions (Signed)
 As discussed, you have been diagnosed with a compression fracture of one of your back vertebrae.  Typically these are painful though not as dangerous.  Applying medicated patches and using Tylenol , 650 mg, up to 3 times daily should be sufficient for pain control.  Please call your physician tomorrow to arrange outpatient follow-up as needed.  Return here for concerning changes in your condition.

## 2023-08-16 NOTE — ED Triage Notes (Signed)
 Patient from home for eval of back pain since picking up a barstool two days ago. Denies trauma or fall. EKG showing bigeminy.

## 2023-10-19 DIAGNOSIS — I480 Paroxysmal atrial fibrillation: Secondary | ICD-10-CM | POA: Diagnosis not present

## 2023-10-19 DIAGNOSIS — R634 Abnormal weight loss: Secondary | ICD-10-CM | POA: Diagnosis not present

## 2023-10-19 DIAGNOSIS — E785 Hyperlipidemia, unspecified: Secondary | ICD-10-CM | POA: Diagnosis not present

## 2023-10-19 DIAGNOSIS — I5042 Chronic combined systolic (congestive) and diastolic (congestive) heart failure: Secondary | ICD-10-CM | POA: Diagnosis not present

## 2023-10-19 DIAGNOSIS — E042 Nontoxic multinodular goiter: Secondary | ICD-10-CM | POA: Diagnosis not present

## 2023-10-19 DIAGNOSIS — I10 Essential (primary) hypertension: Secondary | ICD-10-CM | POA: Diagnosis not present

## 2023-10-19 DIAGNOSIS — E039 Hypothyroidism, unspecified: Secondary | ICD-10-CM | POA: Diagnosis not present
